# Patient Record
Sex: Male | Born: 1935 | Race: Black or African American | Hispanic: No | Marital: Married | Smoking: Former smoker
Health system: Southern US, Community
[De-identification: ages and names within clinical notes are randomized; demographics above are authoritative.]

## PROBLEM LIST (undated history)

## (undated) DIAGNOSIS — M549 Dorsalgia, unspecified: Secondary | ICD-10-CM

## (undated) DIAGNOSIS — N3010 Interstitial cystitis (chronic) without hematuria: Secondary | ICD-10-CM

## (undated) DIAGNOSIS — I5022 Chronic systolic (congestive) heart failure: Secondary | ICD-10-CM

## (undated) DIAGNOSIS — Z86006 Personal history of melanoma in-situ: Secondary | ICD-10-CM

## (undated) DIAGNOSIS — G8929 Other chronic pain: Secondary | ICD-10-CM

## (undated) DIAGNOSIS — Z789 Other specified health status: Secondary | ICD-10-CM

## (undated) DIAGNOSIS — Z950 Presence of cardiac pacemaker: Secondary | ICD-10-CM

## (undated) DIAGNOSIS — N529 Male erectile dysfunction, unspecified: Secondary | ICD-10-CM

## (undated) DIAGNOSIS — N400 Enlarged prostate without lower urinary tract symptoms: Secondary | ICD-10-CM

## (undated) DIAGNOSIS — I6529 Occlusion and stenosis of unspecified carotid artery: Secondary | ICD-10-CM

## (undated) DIAGNOSIS — M1990 Unspecified osteoarthritis, unspecified site: Secondary | ICD-10-CM

## (undated) DIAGNOSIS — K861 Other chronic pancreatitis: Secondary | ICD-10-CM

## (undated) DIAGNOSIS — F0280 Dementia in other diseases classified elsewhere without behavioral disturbance: Secondary | ICD-10-CM

## (undated) DIAGNOSIS — I442 Atrioventricular block, complete: Secondary | ICD-10-CM

## (undated) DIAGNOSIS — I340 Nonrheumatic mitral (valve) insufficiency: Secondary | ICD-10-CM

## (undated) DIAGNOSIS — H539 Unspecified visual disturbance: Secondary | ICD-10-CM

## (undated) DIAGNOSIS — R21 Rash and other nonspecific skin eruption: Secondary | ICD-10-CM

## (undated) DIAGNOSIS — N3941 Urge incontinence: Secondary | ICD-10-CM

## (undated) DIAGNOSIS — I4891 Unspecified atrial fibrillation: Secondary | ICD-10-CM

## (undated) DIAGNOSIS — I10 Essential (primary) hypertension: Secondary | ICD-10-CM

## (undated) DIAGNOSIS — I428 Other cardiomyopathies: Secondary | ICD-10-CM

## (undated) DIAGNOSIS — F0390 Unspecified dementia without behavioral disturbance: Secondary | ICD-10-CM

## (undated) DIAGNOSIS — H9190 Unspecified hearing loss, unspecified ear: Secondary | ICD-10-CM

## (undated) DIAGNOSIS — Z87898 Personal history of other specified conditions: Secondary | ICD-10-CM

## (undated) DIAGNOSIS — M940 Chondrocostal junction syndrome [Tietze]: Secondary | ICD-10-CM

## (undated) DIAGNOSIS — G309 Alzheimer's disease, unspecified: Secondary | ICD-10-CM

## (undated) DIAGNOSIS — C801 Malignant (primary) neoplasm, unspecified: Secondary | ICD-10-CM

## (undated) HISTORY — PX: INGUINAL HERNIA REPAIR: SUR1180

## (undated) HISTORY — DX: Unspecified visual disturbance: H53.9

## (undated) HISTORY — PX: CATARACT EXTRACTION, BILATERAL: SHX1313

## (undated) HISTORY — DX: Chronic systolic (congestive) heart failure: I50.22

## (undated) HISTORY — DX: Rash and other nonspecific skin eruption: R21

## (undated) HISTORY — DX: Dementia in other diseases classified elsewhere, unspecified severity, without behavioral disturbance, psychotic disturbance, mood disturbance, and anxiety: F02.80

## (undated) HISTORY — DX: Male erectile dysfunction, unspecified: N52.9

## (undated) HISTORY — PX: BACK SURGERY: SHX140

## (undated) HISTORY — DX: Chondrocostal junction syndrome (tietze): M94.0

## (undated) HISTORY — PX: PACEMAKER INSERTION: SHX728

## (undated) HISTORY — DX: Malignant (primary) neoplasm, unspecified: C80.1

## (undated) HISTORY — DX: Alzheimer's disease, unspecified: G30.9

## (undated) HISTORY — DX: Other chronic pancreatitis: K86.1

## (undated) HISTORY — PX: INTERSTIM IMPLANT PLACEMENT: SHX5130

---
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
## 1998-04-15 ENCOUNTER — Ambulatory Visit (HOSPITAL_COMMUNITY): Admission: RE | Admit: 1998-04-15 | Discharge: 1998-04-15 | Payer: Self-pay | Admitting: Neurological Surgery

## 1998-04-28 ENCOUNTER — Ambulatory Visit (HOSPITAL_COMMUNITY): Admission: RE | Admit: 1998-04-28 | Discharge: 1998-04-28 | Payer: Self-pay | Admitting: Neurological Surgery

## 1998-05-12 ENCOUNTER — Ambulatory Visit (HOSPITAL_COMMUNITY): Admission: RE | Admit: 1998-05-12 | Discharge: 1998-05-12 | Payer: Self-pay | Admitting: Neurological Surgery

## 1998-05-27 ENCOUNTER — Ambulatory Visit (HOSPITAL_COMMUNITY): Admission: RE | Admit: 1998-05-27 | Discharge: 1998-05-27 | Payer: Self-pay | Admitting: Neurological Surgery

## 1998-07-09 ENCOUNTER — Ambulatory Visit (HOSPITAL_COMMUNITY): Admission: RE | Admit: 1998-07-09 | Discharge: 1998-07-09 | Payer: Self-pay | Admitting: Neurological Surgery

## 1998-07-14 ENCOUNTER — Emergency Department (HOSPITAL_COMMUNITY): Admission: EM | Admit: 1998-07-14 | Discharge: 1998-07-14 | Payer: Self-pay | Admitting: Emergency Medicine

## 1999-01-05 ENCOUNTER — Ambulatory Visit (HOSPITAL_COMMUNITY): Admission: RE | Admit: 1999-01-05 | Discharge: 1999-01-05 | Payer: Self-pay | Admitting: Neurological Surgery

## 1999-01-05 ENCOUNTER — Encounter: Payer: Self-pay | Admitting: Neurological Surgery

## 1999-01-21 ENCOUNTER — Encounter: Payer: Self-pay | Admitting: Neurological Surgery

## 1999-01-21 ENCOUNTER — Ambulatory Visit (HOSPITAL_COMMUNITY): Admission: RE | Admit: 1999-01-21 | Discharge: 1999-01-21 | Payer: Self-pay | Admitting: Neurological Surgery

## 1999-04-21 ENCOUNTER — Encounter: Payer: Self-pay | Admitting: Neurological Surgery

## 1999-04-23 ENCOUNTER — Encounter: Payer: Self-pay | Admitting: Neurological Surgery

## 1999-04-23 ENCOUNTER — Inpatient Hospital Stay (HOSPITAL_COMMUNITY): Admission: RE | Admit: 1999-04-23 | Discharge: 1999-05-04 | Payer: Self-pay | Admitting: Neurological Surgery

## 1999-05-04 ENCOUNTER — Inpatient Hospital Stay (HOSPITAL_COMMUNITY)
Admission: RE | Admit: 1999-05-04 | Discharge: 1999-05-14 | Payer: Self-pay | Admitting: Physical Medicine and Rehabilitation

## 1999-05-18 ENCOUNTER — Encounter
Admission: RE | Admit: 1999-05-18 | Discharge: 1999-08-16 | Payer: Self-pay | Admitting: Physical Medicine and Rehabilitation

## 1999-09-11 ENCOUNTER — Ambulatory Visit (HOSPITAL_COMMUNITY): Admission: RE | Admit: 1999-09-11 | Discharge: 1999-09-11 | Payer: Self-pay | Admitting: Urology

## 2000-06-07 ENCOUNTER — Observation Stay (HOSPITAL_COMMUNITY): Admission: RE | Admit: 2000-06-07 | Discharge: 2000-06-07 | Payer: Self-pay | Admitting: Urology

## 2000-06-07 ENCOUNTER — Encounter: Payer: Self-pay | Admitting: Urology

## 2000-06-16 ENCOUNTER — Encounter: Payer: Self-pay | Admitting: Urology

## 2000-06-21 ENCOUNTER — Ambulatory Visit (HOSPITAL_COMMUNITY): Admission: RE | Admit: 2000-06-21 | Discharge: 2000-06-21 | Payer: Self-pay | Admitting: Urology

## 2000-06-21 ENCOUNTER — Encounter: Payer: Self-pay | Admitting: Urology

## 2000-10-04 HISTORY — PX: OTHER SURGICAL HISTORY: SHX169

## 2001-02-10 ENCOUNTER — Encounter (HOSPITAL_COMMUNITY): Admission: RE | Admit: 2001-02-10 | Discharge: 2001-03-12 | Payer: Self-pay | Admitting: Oncology

## 2001-02-10 ENCOUNTER — Encounter: Admission: RE | Admit: 2001-02-10 | Discharge: 2001-02-10 | Payer: Self-pay | Admitting: Oncology

## 2001-06-30 ENCOUNTER — Encounter (HOSPITAL_COMMUNITY): Admission: RE | Admit: 2001-06-30 | Discharge: 2001-07-30 | Payer: Self-pay | Admitting: Oncology

## 2001-06-30 ENCOUNTER — Encounter: Admission: RE | Admit: 2001-06-30 | Discharge: 2001-06-30 | Payer: Self-pay | Admitting: Oncology

## 2001-12-27 ENCOUNTER — Encounter: Admission: RE | Admit: 2001-12-27 | Discharge: 2001-12-27 | Payer: Self-pay | Admitting: Oncology

## 2002-05-22 ENCOUNTER — Ambulatory Visit (HOSPITAL_COMMUNITY): Admission: RE | Admit: 2002-05-22 | Discharge: 2002-05-22 | Payer: Self-pay | Admitting: Family Medicine

## 2002-05-22 ENCOUNTER — Encounter: Payer: Self-pay | Admitting: Family Medicine

## 2002-05-29 ENCOUNTER — Encounter: Admission: RE | Admit: 2002-05-29 | Discharge: 2002-05-29 | Payer: Self-pay | Admitting: Oncology

## 2002-05-29 ENCOUNTER — Encounter (HOSPITAL_COMMUNITY): Admission: RE | Admit: 2002-05-29 | Discharge: 2002-06-28 | Payer: Self-pay | Admitting: Oncology

## 2002-07-11 ENCOUNTER — Encounter (HOSPITAL_COMMUNITY): Admission: RE | Admit: 2002-07-11 | Discharge: 2002-08-10 | Payer: Self-pay | Admitting: Oncology

## 2002-07-11 ENCOUNTER — Encounter: Admission: RE | Admit: 2002-07-11 | Discharge: 2002-07-11 | Payer: Self-pay | Admitting: Oncology

## 2002-07-31 ENCOUNTER — Ambulatory Visit (HOSPITAL_COMMUNITY): Admission: RE | Admit: 2002-07-31 | Discharge: 2002-07-31 | Payer: Self-pay | Admitting: Cardiology

## 2003-01-14 ENCOUNTER — Encounter: Admission: RE | Admit: 2003-01-14 | Discharge: 2003-01-14 | Payer: Self-pay | Admitting: Oncology

## 2003-01-14 ENCOUNTER — Encounter (HOSPITAL_COMMUNITY): Admission: RE | Admit: 2003-01-14 | Discharge: 2003-02-13 | Payer: Self-pay | Admitting: Oncology

## 2003-02-18 ENCOUNTER — Emergency Department (HOSPITAL_COMMUNITY): Admission: EM | Admit: 2003-02-18 | Discharge: 2003-02-18 | Payer: Self-pay | Admitting: Emergency Medicine

## 2003-02-18 ENCOUNTER — Encounter: Payer: Self-pay | Admitting: Emergency Medicine

## 2003-02-19 ENCOUNTER — Inpatient Hospital Stay (HOSPITAL_COMMUNITY): Admission: AD | Admit: 2003-02-19 | Discharge: 2003-02-25 | Payer: Self-pay | Admitting: Internal Medicine

## 2003-02-20 ENCOUNTER — Encounter: Payer: Self-pay | Admitting: Pulmonary Disease

## 2003-05-13 ENCOUNTER — Encounter: Payer: Self-pay | Admitting: Urology

## 2003-05-14 ENCOUNTER — Ambulatory Visit (HOSPITAL_BASED_OUTPATIENT_CLINIC_OR_DEPARTMENT_OTHER): Admission: RE | Admit: 2003-05-14 | Discharge: 2003-05-14 | Payer: Self-pay | Admitting: Urology

## 2003-05-14 ENCOUNTER — Encounter: Payer: Self-pay | Admitting: Urology

## 2003-05-14 HISTORY — PX: OTHER SURGICAL HISTORY: SHX169

## 2003-07-17 ENCOUNTER — Encounter: Admission: RE | Admit: 2003-07-17 | Discharge: 2003-07-17 | Payer: Self-pay | Admitting: Oncology

## 2003-10-01 ENCOUNTER — Ambulatory Visit (HOSPITAL_COMMUNITY): Admission: RE | Admit: 2003-10-01 | Discharge: 2003-10-01 | Payer: Self-pay | Admitting: Family Medicine

## 2004-01-15 ENCOUNTER — Encounter (HOSPITAL_COMMUNITY): Admission: RE | Admit: 2004-01-15 | Discharge: 2004-02-14 | Payer: Self-pay | Admitting: Oncology

## 2004-01-15 ENCOUNTER — Encounter: Admission: RE | Admit: 2004-01-15 | Discharge: 2004-01-15 | Payer: Self-pay | Admitting: Oncology

## 2004-04-14 ENCOUNTER — Ambulatory Visit (HOSPITAL_COMMUNITY): Admission: RE | Admit: 2004-04-14 | Discharge: 2004-04-14 | Payer: Self-pay | Admitting: Urology

## 2004-04-14 ENCOUNTER — Ambulatory Visit (HOSPITAL_BASED_OUTPATIENT_CLINIC_OR_DEPARTMENT_OTHER): Admission: RE | Admit: 2004-04-14 | Discharge: 2004-04-14 | Payer: Self-pay | Admitting: Urology

## 2004-04-14 HISTORY — PX: OTHER SURGICAL HISTORY: SHX169

## 2004-07-14 ENCOUNTER — Ambulatory Visit (HOSPITAL_COMMUNITY): Admission: RE | Admit: 2004-07-14 | Discharge: 2004-07-14 | Payer: Self-pay | Admitting: Orthopaedic Surgery

## 2004-07-17 ENCOUNTER — Encounter: Admission: RE | Admit: 2004-07-17 | Discharge: 2004-07-17 | Payer: Self-pay | Admitting: Oncology

## 2004-07-17 ENCOUNTER — Encounter (HOSPITAL_COMMUNITY): Admission: RE | Admit: 2004-07-17 | Discharge: 2004-08-16 | Payer: Self-pay | Admitting: Oncology

## 2005-07-20 ENCOUNTER — Encounter (HOSPITAL_COMMUNITY): Admission: RE | Admit: 2005-07-20 | Discharge: 2005-08-19 | Payer: Self-pay | Admitting: Oncology

## 2005-07-20 ENCOUNTER — Encounter: Admission: RE | Admit: 2005-07-20 | Discharge: 2005-07-20 | Payer: Self-pay | Admitting: Oncology

## 2005-07-20 ENCOUNTER — Ambulatory Visit (HOSPITAL_COMMUNITY): Payer: Self-pay | Admitting: Oncology

## 2006-01-18 ENCOUNTER — Encounter (HOSPITAL_COMMUNITY): Admission: RE | Admit: 2006-01-18 | Discharge: 2006-02-17 | Payer: Self-pay | Admitting: Oncology

## 2006-01-18 ENCOUNTER — Ambulatory Visit (HOSPITAL_COMMUNITY): Payer: Self-pay | Admitting: Oncology

## 2006-01-18 ENCOUNTER — Encounter: Admission: RE | Admit: 2006-01-18 | Discharge: 2006-01-18 | Payer: Self-pay | Admitting: Oncology

## 2006-02-02 ENCOUNTER — Encounter: Payer: Self-pay | Admitting: Neurological Surgery

## 2006-05-27 ENCOUNTER — Ambulatory Visit (HOSPITAL_BASED_OUTPATIENT_CLINIC_OR_DEPARTMENT_OTHER): Admission: RE | Admit: 2006-05-27 | Discharge: 2006-05-27 | Payer: Self-pay | Admitting: Urology

## 2006-06-10 ENCOUNTER — Ambulatory Visit (HOSPITAL_BASED_OUTPATIENT_CLINIC_OR_DEPARTMENT_OTHER): Admission: RE | Admit: 2006-06-10 | Discharge: 2006-06-10 | Payer: Self-pay | Admitting: Urology

## 2006-07-05 ENCOUNTER — Ambulatory Visit: Payer: Self-pay | Admitting: Family Medicine

## 2006-07-07 ENCOUNTER — Ambulatory Visit: Payer: Self-pay | Admitting: Internal Medicine

## 2006-07-07 ENCOUNTER — Ambulatory Visit (HOSPITAL_COMMUNITY): Admission: RE | Admit: 2006-07-07 | Discharge: 2006-07-07 | Payer: Self-pay | Admitting: Family Medicine

## 2006-08-18 ENCOUNTER — Encounter (INDEPENDENT_AMBULATORY_CARE_PROVIDER_SITE_OTHER): Payer: Self-pay | Admitting: Specialist

## 2006-08-18 ENCOUNTER — Ambulatory Visit: Payer: Self-pay | Admitting: Internal Medicine

## 2006-08-18 ENCOUNTER — Ambulatory Visit (HOSPITAL_COMMUNITY): Admission: RE | Admit: 2006-08-18 | Discharge: 2006-08-18 | Payer: Self-pay | Admitting: Internal Medicine

## 2006-08-18 HISTORY — PX: COLONOSCOPY: SHX174

## 2006-08-18 LAB — HM COLONOSCOPY: HM Colonoscopy: NORMAL

## 2006-08-29 ENCOUNTER — Ambulatory Visit: Payer: Self-pay | Admitting: Family Medicine

## 2007-01-12 ENCOUNTER — Ambulatory Visit (HOSPITAL_COMMUNITY): Payer: Self-pay | Admitting: Oncology

## 2007-01-12 ENCOUNTER — Encounter (HOSPITAL_COMMUNITY): Admission: RE | Admit: 2007-01-12 | Discharge: 2007-02-11 | Payer: Self-pay | Admitting: Oncology

## 2007-04-06 ENCOUNTER — Ambulatory Visit: Payer: Self-pay | Admitting: Family Medicine

## 2007-04-19 ENCOUNTER — Ambulatory Visit (HOSPITAL_COMMUNITY): Admission: RE | Admit: 2007-04-19 | Discharge: 2007-04-19 | Payer: Self-pay | Admitting: Family Medicine

## 2007-04-21 ENCOUNTER — Encounter: Payer: Self-pay | Admitting: Family Medicine

## 2007-04-21 LAB — CONVERTED CEMR LAB: Creatinine, Ser: 1.4 mg/dL (ref 0.40–1.50)

## 2007-04-25 ENCOUNTER — Ambulatory Visit (HOSPITAL_COMMUNITY): Admission: RE | Admit: 2007-04-25 | Discharge: 2007-04-25 | Payer: Self-pay | Admitting: Family Medicine

## 2007-05-03 ENCOUNTER — Encounter: Admission: RE | Admit: 2007-05-03 | Discharge: 2007-05-03 | Payer: Self-pay | Admitting: Family Medicine

## 2007-05-18 ENCOUNTER — Encounter: Admission: RE | Admit: 2007-05-18 | Discharge: 2007-05-18 | Payer: Self-pay | Admitting: Family Medicine

## 2007-05-29 ENCOUNTER — Ambulatory Visit: Payer: Self-pay | Admitting: Family Medicine

## 2007-06-29 ENCOUNTER — Ambulatory Visit: Payer: Self-pay | Admitting: Family Medicine

## 2007-08-29 ENCOUNTER — Ambulatory Visit: Payer: Self-pay | Admitting: Family Medicine

## 2007-09-05 ENCOUNTER — Encounter: Payer: Self-pay | Admitting: Family Medicine

## 2007-09-05 LAB — CONVERTED CEMR LAB
Basophils Absolute: 0 10*3/uL (ref 0.0–0.1)
Basophils Relative: 1 % (ref 0–1)
CO2: 29 meq/L (ref 19–32)
Calcium: 9.6 mg/dL (ref 8.4–10.5)
Creatinine, Ser: 1.09 mg/dL (ref 0.40–1.50)
Eosinophils Relative: 3 % (ref 0–5)
HCT: 33.1 % — ABNORMAL LOW (ref 39.0–52.0)
HDL: 62 mg/dL (ref >39)
Hemoglobin: 11.4 g/dL — ABNORMAL LOW (ref 13.0–17.0)
Lymphocytes Relative: 27 % (ref 12–46)
MCHC: 34.4 g/dL (ref 30.0–36.0)
Monocytes Absolute: 0.2 10*3/uL (ref 0.1–1.0)
Neutro Abs: 1.2 10*3/uL — ABNORMAL LOW (ref 1.7–7.7)
Platelets: 137 10*3/uL — ABNORMAL LOW (ref 150–400)
RDW: 13.8 % (ref 11.5–15.5)
Sodium: 144 meq/L (ref 135–145)
Total CHOL/HDL Ratio: 3.2
Triglycerides: 43 mg/dL (ref <150)

## 2008-01-11 ENCOUNTER — Ambulatory Visit (HOSPITAL_COMMUNITY): Payer: Self-pay | Admitting: Oncology

## 2008-01-11 ENCOUNTER — Encounter (HOSPITAL_COMMUNITY): Admission: RE | Admit: 2008-01-11 | Discharge: 2008-02-10 | Payer: Self-pay | Admitting: Oncology

## 2008-01-23 ENCOUNTER — Ambulatory Visit: Payer: Self-pay | Admitting: Family Medicine

## 2008-01-31 DIAGNOSIS — F528 Other sexual dysfunction not due to a substance or known physiological condition: Secondary | ICD-10-CM

## 2008-01-31 DIAGNOSIS — I10 Essential (primary) hypertension: Secondary | ICD-10-CM | POA: Insufficient documentation

## 2008-01-31 DIAGNOSIS — H5310 Unspecified subjective visual disturbances: Secondary | ICD-10-CM | POA: Insufficient documentation

## 2008-01-31 DIAGNOSIS — H409 Unspecified glaucoma: Secondary | ICD-10-CM

## 2008-03-05 ENCOUNTER — Encounter: Payer: Self-pay | Admitting: Family Medicine

## 2008-03-05 ENCOUNTER — Ambulatory Visit: Payer: Self-pay | Admitting: Family Medicine

## 2008-03-08 ENCOUNTER — Ambulatory Visit (HOSPITAL_COMMUNITY): Admission: RE | Admit: 2008-03-08 | Discharge: 2008-03-08 | Payer: Self-pay | Admitting: Family Medicine

## 2008-03-16 ENCOUNTER — Emergency Department (HOSPITAL_COMMUNITY): Admission: EM | Admit: 2008-03-16 | Discharge: 2008-03-16 | Payer: Self-pay | Admitting: Emergency Medicine

## 2008-05-02 ENCOUNTER — Ambulatory Visit (HOSPITAL_COMMUNITY): Admission: RE | Admit: 2008-05-02 | Discharge: 2008-05-02 | Payer: Self-pay | Admitting: Family Medicine

## 2008-05-02 ENCOUNTER — Ambulatory Visit: Payer: Self-pay | Admitting: Family Medicine

## 2008-05-02 ENCOUNTER — Encounter: Payer: Self-pay | Admitting: Family Medicine

## 2008-05-03 ENCOUNTER — Ambulatory Visit (HOSPITAL_COMMUNITY): Admission: RE | Admit: 2008-05-03 | Discharge: 2008-05-03 | Payer: Self-pay | Admitting: Neurological Surgery

## 2008-05-03 DIAGNOSIS — M940 Chondrocostal junction syndrome [Tietze]: Secondary | ICD-10-CM

## 2008-05-03 DIAGNOSIS — R55 Syncope and collapse: Secondary | ICD-10-CM

## 2008-05-06 ENCOUNTER — Ambulatory Visit: Payer: Self-pay | Admitting: Family Medicine

## 2008-05-06 ENCOUNTER — Telehealth: Payer: Self-pay | Admitting: Family Medicine

## 2008-05-28 ENCOUNTER — Encounter: Payer: Self-pay | Admitting: Family Medicine

## 2008-05-28 ENCOUNTER — Ambulatory Visit: Payer: Self-pay | Admitting: Cardiology

## 2008-05-30 ENCOUNTER — Ambulatory Visit (HOSPITAL_COMMUNITY): Admission: RE | Admit: 2008-05-30 | Discharge: 2008-05-30 | Payer: Self-pay | Admitting: Cardiology

## 2008-05-31 ENCOUNTER — Telehealth: Payer: Self-pay | Admitting: Family Medicine

## 2008-06-18 ENCOUNTER — Ambulatory Visit: Payer: Self-pay | Admitting: Cardiology

## 2008-06-18 ENCOUNTER — Encounter: Payer: Self-pay | Admitting: Family Medicine

## 2008-06-19 ENCOUNTER — Ambulatory Visit (HOSPITAL_COMMUNITY): Payer: Self-pay | Admitting: Family Medicine

## 2008-06-19 ENCOUNTER — Encounter (HOSPITAL_COMMUNITY): Admission: RE | Admit: 2008-06-19 | Discharge: 2008-07-01 | Payer: Self-pay | Admitting: Oncology

## 2008-07-11 ENCOUNTER — Ambulatory Visit: Payer: Self-pay | Admitting: Family Medicine

## 2008-07-30 ENCOUNTER — Telehealth: Payer: Self-pay | Admitting: Family Medicine

## 2008-09-18 ENCOUNTER — Encounter (HOSPITAL_COMMUNITY): Admission: RE | Admit: 2008-09-18 | Discharge: 2008-10-01 | Payer: Self-pay | Admitting: Oncology

## 2008-09-18 ENCOUNTER — Ambulatory Visit (HOSPITAL_COMMUNITY): Payer: Self-pay | Admitting: Oncology

## 2008-10-15 ENCOUNTER — Ambulatory Visit: Payer: Self-pay | Admitting: Family Medicine

## 2008-10-15 DIAGNOSIS — R21 Rash and other nonspecific skin eruption: Secondary | ICD-10-CM

## 2008-12-05 ENCOUNTER — Inpatient Hospital Stay (HOSPITAL_COMMUNITY): Admission: EM | Admit: 2008-12-05 | Discharge: 2008-12-08 | Payer: Self-pay | Admitting: Internal Medicine

## 2008-12-05 ENCOUNTER — Ambulatory Visit: Payer: Self-pay | Admitting: Cardiology

## 2008-12-05 ENCOUNTER — Encounter: Payer: Self-pay | Admitting: Emergency Medicine

## 2008-12-05 ENCOUNTER — Ambulatory Visit: Payer: Self-pay | Admitting: Family Medicine

## 2008-12-05 DIAGNOSIS — R5381 Other malaise: Secondary | ICD-10-CM | POA: Insufficient documentation

## 2008-12-05 DIAGNOSIS — I498 Other specified cardiac arrhythmias: Secondary | ICD-10-CM

## 2008-12-05 DIAGNOSIS — R5383 Other fatigue: Secondary | ICD-10-CM

## 2008-12-06 ENCOUNTER — Encounter (INDEPENDENT_AMBULATORY_CARE_PROVIDER_SITE_OTHER): Payer: Self-pay | Admitting: Ophthalmology

## 2008-12-06 HISTORY — PX: TRANSTHORACIC ECHOCARDIOGRAM: SHX275

## 2008-12-25 ENCOUNTER — Ambulatory Visit: Payer: Self-pay

## 2008-12-25 ENCOUNTER — Encounter: Payer: Self-pay | Admitting: Internal Medicine

## 2008-12-31 DIAGNOSIS — D61818 Other pancytopenia: Secondary | ICD-10-CM

## 2008-12-31 DIAGNOSIS — I5022 Chronic systolic (congestive) heart failure: Secondary | ICD-10-CM

## 2008-12-31 DIAGNOSIS — I080 Rheumatic disorders of both mitral and aortic valves: Secondary | ICD-10-CM

## 2009-01-09 ENCOUNTER — Ambulatory Visit (HOSPITAL_COMMUNITY): Payer: Self-pay | Admitting: Oncology

## 2009-01-09 ENCOUNTER — Encounter (HOSPITAL_COMMUNITY): Admission: RE | Admit: 2009-01-09 | Discharge: 2009-02-09 | Payer: Self-pay | Admitting: Oncology

## 2009-01-15 ENCOUNTER — Encounter: Payer: Self-pay | Admitting: Family Medicine

## 2009-01-17 ENCOUNTER — Encounter: Payer: Self-pay | Admitting: Internal Medicine

## 2009-01-17 ENCOUNTER — Ambulatory Visit: Payer: Self-pay | Admitting: Internal Medicine

## 2009-01-21 ENCOUNTER — Ambulatory Visit: Payer: Self-pay | Admitting: Family Medicine

## 2009-01-22 ENCOUNTER — Encounter: Payer: Self-pay | Admitting: Family Medicine

## 2009-01-22 LAB — CONVERTED CEMR LAB
Calcium: 9.2 mg/dL (ref 8.4–10.5)
Cholesterol: 168 mg/dL (ref 0–200)
Creatinine, Ser: 1.21 mg/dL (ref 0.40–1.50)
HCT: 33.3 % — ABNORMAL LOW (ref 39.0–52.0)
HDL: 63 mg/dL (ref >39)
MCHC: 33.3 g/dL (ref 30.0–36.0)
MCV: 89.8 fL (ref 78.0–100.0)
Platelets: 118 10*3/uL — ABNORMAL LOW (ref 150–400)
RDW: 15.2 % (ref 11.5–15.5)
Sodium: 141 meq/L (ref 135–145)
TSH: 1.683 microintl units/mL (ref 0.350–4.500)
Total CHOL/HDL Ratio: 2.7
Triglycerides: 43 mg/dL (ref <150)

## 2009-04-22 ENCOUNTER — Ambulatory Visit: Payer: Self-pay | Admitting: Family Medicine

## 2009-05-15 ENCOUNTER — Ambulatory Visit (HOSPITAL_BASED_OUTPATIENT_CLINIC_OR_DEPARTMENT_OTHER): Admission: RE | Admit: 2009-05-15 | Discharge: 2009-05-15 | Payer: Self-pay | Admitting: Urology

## 2009-05-15 HISTORY — PX: OTHER SURGICAL HISTORY: SHX169

## 2009-05-28 ENCOUNTER — Encounter: Payer: Self-pay | Admitting: Family Medicine

## 2009-06-03 ENCOUNTER — Encounter: Payer: Self-pay | Admitting: Family Medicine

## 2009-06-18 ENCOUNTER — Encounter: Payer: Self-pay | Admitting: Family Medicine

## 2009-07-08 ENCOUNTER — Ambulatory Visit: Payer: Self-pay | Admitting: Family Medicine

## 2009-07-10 LAB — CONVERTED CEMR LAB
BUN: 21 mg/dL (ref 6–23)
CO2: 25 meq/L (ref 19–32)
Chloride: 106 meq/L (ref 96–112)
Potassium: 4.4 meq/L (ref 3.5–5.3)

## 2009-07-11 ENCOUNTER — Ambulatory Visit (HOSPITAL_COMMUNITY): Payer: Self-pay | Admitting: Oncology

## 2009-07-14 ENCOUNTER — Encounter (HOSPITAL_COMMUNITY): Admission: RE | Admit: 2009-07-14 | Discharge: 2009-08-13 | Payer: Self-pay | Admitting: Oncology

## 2009-07-14 DIAGNOSIS — N3010 Interstitial cystitis (chronic) without hematuria: Secondary | ICD-10-CM

## 2009-07-22 ENCOUNTER — Ambulatory Visit: Payer: Self-pay | Admitting: Internal Medicine

## 2009-07-22 DIAGNOSIS — Z950 Presence of cardiac pacemaker: Secondary | ICD-10-CM | POA: Insufficient documentation

## 2009-07-25 ENCOUNTER — Emergency Department (HOSPITAL_COMMUNITY): Admission: EM | Admit: 2009-07-25 | Discharge: 2009-07-25 | Payer: Self-pay | Admitting: Emergency Medicine

## 2009-08-20 ENCOUNTER — Encounter: Payer: Self-pay | Admitting: Family Medicine

## 2009-08-26 ENCOUNTER — Telehealth: Payer: Self-pay | Admitting: Family Medicine

## 2009-09-16 ENCOUNTER — Telehealth: Payer: Self-pay | Admitting: Family Medicine

## 2009-10-15 ENCOUNTER — Telehealth: Payer: Self-pay | Admitting: Family Medicine

## 2009-11-10 ENCOUNTER — Ambulatory Visit: Payer: Self-pay | Admitting: Family Medicine

## 2009-11-10 DIAGNOSIS — H905 Unspecified sensorineural hearing loss: Secondary | ICD-10-CM

## 2009-11-17 ENCOUNTER — Encounter: Payer: Self-pay | Admitting: Family Medicine

## 2009-11-18 ENCOUNTER — Telehealth: Payer: Self-pay | Admitting: Family Medicine

## 2009-11-25 DIAGNOSIS — R413 Other amnesia: Secondary | ICD-10-CM | POA: Insufficient documentation

## 2009-11-25 DIAGNOSIS — M159 Polyosteoarthritis, unspecified: Secondary | ICD-10-CM | POA: Insufficient documentation

## 2010-01-06 ENCOUNTER — Telehealth: Payer: Self-pay | Admitting: Family Medicine

## 2010-01-12 ENCOUNTER — Ambulatory Visit (HOSPITAL_COMMUNITY): Payer: Self-pay | Admitting: Oncology

## 2010-01-12 ENCOUNTER — Encounter (HOSPITAL_COMMUNITY): Admission: RE | Admit: 2010-01-12 | Discharge: 2010-02-11 | Payer: Self-pay | Admitting: Oncology

## 2010-01-14 ENCOUNTER — Encounter: Payer: Self-pay | Admitting: Family Medicine

## 2010-01-29 ENCOUNTER — Encounter: Payer: Self-pay | Admitting: Family Medicine

## 2010-02-09 ENCOUNTER — Ambulatory Visit: Payer: Self-pay | Admitting: Cardiology

## 2010-02-09 ENCOUNTER — Encounter: Payer: Self-pay | Admitting: Internal Medicine

## 2010-02-10 ENCOUNTER — Encounter: Payer: Self-pay | Admitting: Cardiology

## 2010-02-18 ENCOUNTER — Ambulatory Visit: Payer: Self-pay | Admitting: Family Medicine

## 2010-03-24 ENCOUNTER — Ambulatory Visit: Payer: Self-pay | Admitting: Family Medicine

## 2010-04-07 ENCOUNTER — Ambulatory Visit: Payer: Self-pay | Admitting: Family Medicine

## 2010-04-07 ENCOUNTER — Ambulatory Visit (HOSPITAL_COMMUNITY): Admission: RE | Admit: 2010-04-07 | Discharge: 2010-04-07 | Payer: Self-pay | Admitting: Family Medicine

## 2010-04-07 DIAGNOSIS — M542 Cervicalgia: Secondary | ICD-10-CM

## 2010-04-21 ENCOUNTER — Ambulatory Visit: Payer: Self-pay | Admitting: Family Medicine

## 2010-06-02 ENCOUNTER — Ambulatory Visit: Payer: Self-pay | Admitting: Family Medicine

## 2010-06-05 LAB — CONVERTED CEMR LAB
BUN: 21 mg/dL (ref 6–23)
Cholesterol: 191 mg/dL (ref 0–200)
Creatinine, Ser: 1.34 mg/dL (ref 0.40–1.50)
Glucose, Bld: 99 mg/dL (ref 70–99)
LDL Cholesterol: 126 mg/dL — ABNORMAL HIGH (ref 0–99)
Potassium: 3.9 meq/L (ref 3.5–5.3)
VLDL: 10 mg/dL (ref 0–40)

## 2010-06-25 ENCOUNTER — Telehealth: Payer: Self-pay | Admitting: Family Medicine

## 2010-07-03 ENCOUNTER — Telehealth: Payer: Self-pay | Admitting: Family Medicine

## 2010-07-08 ENCOUNTER — Ambulatory Visit: Payer: Self-pay | Admitting: Family Medicine

## 2010-07-24 ENCOUNTER — Telehealth: Payer: Self-pay | Admitting: Family Medicine

## 2010-07-31 ENCOUNTER — Ambulatory Visit: Payer: Self-pay | Admitting: Cardiology

## 2010-07-31 ENCOUNTER — Encounter: Payer: Self-pay | Admitting: Internal Medicine

## 2010-08-05 ENCOUNTER — Encounter: Payer: Self-pay | Admitting: Family Medicine

## 2010-08-05 ENCOUNTER — Telehealth: Payer: Self-pay | Admitting: Family Medicine

## 2010-08-17 ENCOUNTER — Encounter: Admission: RE | Admit: 2010-08-17 | Discharge: 2010-08-17 | Payer: Self-pay | Admitting: Orthopaedic Surgery

## 2010-09-07 ENCOUNTER — Encounter: Payer: Self-pay | Admitting: Family Medicine

## 2010-09-11 ENCOUNTER — Encounter (INDEPENDENT_AMBULATORY_CARE_PROVIDER_SITE_OTHER): Payer: Self-pay | Admitting: *Deleted

## 2010-09-14 ENCOUNTER — Ambulatory Visit: Payer: Self-pay | Admitting: Family Medicine

## 2010-09-14 DIAGNOSIS — D239 Other benign neoplasm of skin, unspecified: Secondary | ICD-10-CM | POA: Insufficient documentation

## 2010-09-22 ENCOUNTER — Encounter: Payer: Self-pay | Admitting: Family Medicine

## 2010-09-22 ENCOUNTER — Emergency Department (HOSPITAL_COMMUNITY)
Admission: EM | Admit: 2010-09-22 | Discharge: 2010-09-23 | Payer: Self-pay | Source: Home / Self Care | Admitting: Emergency Medicine

## 2010-09-25 ENCOUNTER — Encounter: Payer: Self-pay | Admitting: Family Medicine

## 2010-10-08 ENCOUNTER — Telehealth (INDEPENDENT_AMBULATORY_CARE_PROVIDER_SITE_OTHER): Payer: Self-pay | Admitting: *Deleted

## 2010-10-15 ENCOUNTER — Encounter: Payer: Self-pay | Admitting: Family Medicine

## 2010-10-25 ENCOUNTER — Encounter: Payer: Self-pay | Admitting: Family Medicine

## 2010-10-29 ENCOUNTER — Encounter: Payer: Self-pay | Admitting: Family Medicine

## 2010-11-05 NOTE — Letter (Signed)
Summary: Appointment - Reminder 2  [facility] at [facility]. [address]   [place], [place] [address]   Phone: [phone]  Fax: [phone]     September 11, 2010 MRN: [id number]   [name] [address] [place], [place]  [address]   Dear [name],  Our records indicate that it is time to schedule a follow-up appointment.  Dr.  Ladona Ridgel        recommended that you follow up with [place] in     11.2011 PAST DUE       . It is very important that we reach you to schedule this appointment. We look forward to participating in your health care needs. Please contact us at the number listed above at your earliest convenience to schedule your appointment.  If you are unable to make an appointment at this time, give [place] a call so we can update our records.     Sincerely,   Glass blower/designer

## 2010-11-05 NOTE — Procedures (Signed)
Summary: pc2 per reminder letter/sn   Current Medications (verified): 1)  Maxzide-25 37.5-25 Mg  Tabs (Triamterene-Hctz) .... Take 1 Tablet By Mouth Once A Day 2)  Uretron D/s  Tabs (Meth-Hyo-M Bl-Na Phos-Ph Sal) .... One Tab Three Times A Day 3)  Amitriptyline Hcl 25 Mg  Tabs (Amitriptyline Hcl) .... Take 1 Tab By Mouth At Bedtime 4)  Allopurinol 300 Mg Tabs (Allopurinol) .... One Tab By Mouth Qd 5)  Nabumetone 500 Mg Tabs (Nabumetone) .... 2 Tabs Daily 6)  Duragesic-50 50 Mcg/hr Pt72 (Fentanyl) .... Applyione Patch Every 3 Days 7)  Omeprazole 20 Mg Cpdr (Omeprazole) .... Take 1 Capsule By Mouth Once A Day 8)  Exelon 9.5 Mg/24hr Pt24 (Rivastigmine) .... Apply One Patch Daily, Then Remove 9)  Flomax 0.4 Mg Caps (Tamsulosin Hcl) .... One By Mouth Daily  Allergies (verified): 1)  ! Codeine   PPM Specifications Following MD:  Lewayne Bunting, MD     PPM Vendor:  Med[employer]onic     PPM Model Number:  [id number]     PPM Serial Number:  [id number] PPM DOI:  12/06/2008      Lead 1    Location: RA     DOI: 12/06/2008     Model #: [id number]     Serial #: [id number]     Status: active Lead 2    Location: RV     DOI: 12/06/2008     Model #: 6213     Serial #: [id number]     Status: active Lead 3    Location: LV     DOI: 12/06/2008     Model #: 2952     Serial #: [id number]     Status: active  Magnet Response Rate:  BOL 85 ERI 65  Indications:  CHF; 2:1 HB   PPM Follow Up Remote Check?  No Battery Voltage:  2.999 V     Battery Est. Longevity:  5.5 years     Pacer Dependent:  Yes       PPM Device Measurements Atrium  Amplitude: 2.8 mV, Impedance: 349 ohms, Threshold: 0.5 V at 0.4 msec Right Ventricle  Impedance: 415 ohms, Threshold: 1.0 V at 0.4 msec Left Ventricle  Impedance: 783 ohms, Threshold: 1.0 V at 0.4 msec  Episodes MS Episodes:  23     Percent Mode Switch:  0%     Coumadin:  No Ventricular High Rate:  0     Atrial Pacing:  39.5%     Ventricular Pacing:  100%  Parameters Mode:  DDD     Lower  Rate Limit:  60     Upper Rate Limit:  130 Paced AV Delay:  140     Sensed AV Delay:  120 Next Cardiology Appt Due:  01/03/2011 Tech Comments:  No parameter changes.  Device function noramal.  ROV 6 months with Dr. Ladona Ridgel in [place]. Altha Harm, LPN  July 31, 2010 3:28 PM

## 2010-11-05 NOTE — Progress Notes (Signed)
[facility]  [facility]   Imported By: Lind Guest 01/29/2010 09:59:26  _____________________________________________________________________  External Attachment:    Type:   Image     Comment:   External Document

## 2010-11-05 NOTE — Progress Notes (Signed)
Summary: medication to [facility]  Phone Note Call from Patient   Summary of Call: patient would like to use [facility] mail order for his duragesic 50 patch, he states they are cheaper their then at the pharmacy.  the number you can call for [facility] is [phone].  He only has 4 more patches, and he would like this called in for 3 months supply.  pramadol hcl tablet, he also needs those called in at a 3 month supply. Initial call taken by: Curtis Sites,  August 05, 2010 9:57 AM  Follow-up for Phone Call        patient states he spoke with pharmacy and they told him he could get 90 day supply of duragesic patches, wants you to write this, there was a script in narc drawer but i threw it away, it was outdated. Follow-up by: Adella Hare LPN,  August 05, 2010 12:08 PM  Additional Follow-up for Phone Call Additional follow up Details #1::        ca;l [facility] pls andverify this i am unaware of this policy, let me know if it can be done i will do it, let pt know you are checking on this pls Additional Follow-up by: Syliva Overman MD,  August 05, 2010 12:10 PM    Additional Follow-up for Phone Call Additional follow up Details #2::    per [facility] pharmacist, you can write for 90 days of duragesic patch,  Follow-up by: Adella Hare LPN,  August 12, 2010 4:42 PM  Additional Follow-up for Phone Call Additional follow up Details #3:: Details for Additional Follow-up Action Taken: script written for 90 day supply , may need to be maile by thept, not sure, pls find out from [facility], and let pt know, pls enter in emr also Additional Follow-up by: Syliva Overman MD,  August 21, 2010 8:12 AM  called patient, no answer lefJaime Boothe LPN  August 31, 2010 2:46 PM patient wife aware script is available for pickup Adella Hare LPN  September 04, 2010 3:30 PM

## 2010-11-05 NOTE — Assessment & Plan Note (Signed)
Summary: office visit   Vital Signs:  Patient profile:   77 year old male Height:      68 inches Weight:      177.25 pounds BMI:     27.05 O2 Sat:      99 % Pulse rate:   80 / minute Pulse rhythm:   regular Resp:     16 per minute BP sitting:   104 / 80  (left arm) Cuff size:   large  Vitals Entered By: Everitt Amber LPN (Feb 18, 2010 10:46 AM)  Nutrition Counseling: Patient's BMI is greater than 25 and therefore counseled on weight management options. CC: Still having back pain really bad,    Primary Care Provider:  Dr. Lodema [facility]  CC:  Still having back pain really bad and .  History of Present Illness: Reports  that he has not been doing well.His problem is severe and uncontrolled back pain, epidural injections have not afforded the relief that he had hoped for inrecent times. pain is advwersely affecting all aspectsof his life. Denies recent fever or chills. Denies sinus pressure, nasal congestion , ear pain or sore throat. Denies chest congestion, or cough productive of sputum. Denies chest pain, palpitations, PND, orthopnea or leg swelling. Denies abdominal pain, nausea, vomitting, diarrhea or constipation. Denies change in bowel movements or bloody stool. Denies dysuria , frequency, incontinence or hesitancy.  Denies headaches, vertigo, seizures. Denies depression, anxiety or insomnia. Denies  rash, lesions, or itch.     Current Medications (verified): 1)  Maxzide-25 37.5-25 Mg  Tabs (Triamterene-Hctz) .... Take 1 Tablet By Mouth Once A Day 2)  Uretron D/s  Tabs (Meth-Hyo-M Bl-Na Phos-Ph Sal) .... One Tab Three Times A Day 3)  Cialis 10 Mg  Tabs (Tadalafil) .... Take 1 Tablet By Mouth Once A Day As Needed 4)  Flomax 0.4 Mg  Cp24 (Tamsulosin Hcl) .... Take 1 Tablet By Mouth Once A Day 5)  Amitriptyline Hcl 25 Mg  Tabs (Amitriptyline Hcl) .... Take 1 Tab By Mouth At Bedtime 6)  Flexeril 10 Mg  Tabs (Cyclobenzaprine Hcl) .... Take 1 Tab By Mouth At  Bedtime 7)  Tramadol Hcl 50 Mg  Tabs (Tramadol Hcl) .... Take 1 Tablet By Mouth Three Times A Day 8)  Elmiron 100 Mg Caps (Pentosan Polysulfate Sodium) .... Take 2 Caps By Mouth Two Times A Day 9)  Allopurinol 300 Mg Tabs (Allopurinol) .... One Tab By Mouth Qd 10)  Celebrex 200 Mg Caps (Celecoxib) .... One Caop By Mouth Qd 11)  Exelon 9.5 Mg/24hr Pt24 (Rivastigmine) .... Apply One Patch Daily To Upper Chest and Upper Arms Start in March 2011 12)  Nabumetone 500 Mg Tabs (Nabumetone) .... 2 Tabs Daily  Allergies (verified): 1)  ! Codeine  Review of Systems      See HPI General:  Complains of fatigue, sleep disorder, and weakness; denies chills and fever; uncontrolled pain. Eyes:  Denies discharge and red eye. CV:  recently had pacemaker check which was god. MS:  Complains of low back pain and mid back pain; recently had an epidural; injecrtion which had no reliefg, back pian is uncontrolled, feels like crying, wakes up in the night in p[ain. Endo:  Denies cold intolerance, excessive hunger, excessive thirst, excessive urination, heat intolerance, polyuria, and weight change. Heme:  Denies abnormal bruising and bleeding. Allergy:  Denies hives or rash and itching eyes.  Physical Exam  General:  alert and well-nourished.  well-hydrated.  pt in pain HEENT: No  facial asymmetry,  EOMI, No sinus tenderness, TM's Clear, oropharynx  pink and moist.   Chest: Clear to auscultation bilaterally.  CVS: S1, S2, systolic murmurs, No S3.   Abd: Soft, Nontender.  MS: decreased  ROM spine, hips, shoulders and knees.  Ext: No edema.   CNS: CN 2-12 intact, power tone and sensation normal throughout.   Skin: Intact, no visible lesions or rashes.  Psych: Good eye contact, normal affect.  Memory intact, t anxious and  depressed appearing.    Impression & Recommendations:  Problem # 1:  BACK PAIN (ICD-724.5) Assessment Deteriorated  His updated medication list for this problem includes:    Flexeril  10 Mg Tabs (Cyclobenzaprine hcl) .Marland Kitchen... Take 1 tab by mouth at bedtime    Tramadol Hcl 50 Mg Tabs (Tramadol hcl) .Marland Kitchen... Take 1 tablet by mouth three times a day    Celebrex 200 Mg Caps (Celecoxib) ..... One caop by mouth qd    Nabumetone 500 Mg Tabs (Nabumetone) .Marland Kitchen... 2 tabs daily    Duragesic-25 25 Mcg/hr Pt72 (Fentanyl) .Marland Kitchen... Change every 72 hrs  Problem # 2:  GEN OSTEOARTHROSIS INVOLVING MULTIPLE SITES (ICD-715.09) Assessment: Deteriorated  His updated medication list for this problem includes:    Tramadol Hcl 50 Mg Tabs (Tramadol hcl) .Marland Kitchen... Take 1 tablet by mouth three times a day    Celebrex 200 Mg Caps (Celecoxib) ..... One caop by mouth qd    Nabumetone 500 Mg Tabs (Nabumetone) .Marland Kitchen... 2 tabs daily    Duragesic-25 25 Mcg/hr Pt72 (Fentanyl) .Marland Kitchen... Change every 72 hrs  Problem # 3:  INTERSTITIAL CYSTITIS (ICD-595.1) Assessment: Unchanged  Problem # 4:  HYPERTENSION (ICD-401.9) Assessment: Unchanged  His updated medication list for this problem includes:    Maxzide-25 37.5-25 Mg Tabs (Triamterene-hctz) .Marland Kitchen... Take 1 tablet by mouth once a day  BP today: 104/80 Prior BP: 120/70 (11/10/2009)  Labs Reviewed: K+: 4.4 (07/08/2009) Creat: : 1.18 (07/08/2009)   Chol: 168 (01/22/2009)   HDL: 63 (01/22/2009)   LDL: 96 (01/22/2009)   TG: 43 (01/22/2009)  Problem # 5:  PANCYTOPENIA (ICD-284.1) Assessment: Unchanged followed annually by hematology  Complete Medication List: 1)  Maxzide-25 37.5-25 Mg Tabs (Triamterene-hctz) .... Take 1 tablet by mouth once a day 2)  Uretron D/s Tabs (Meth-hyo-m bl-na phos-ph sal) .... One tab three times a day 3)  Cialis 10 Mg Tabs (Tadalafil) .... Take 1 tablet by mouth once a day as needed 4)  Flomax 0.4 Mg Cp24 (Tamsulosin hcl) .... Take 1 tablet by mouth once a day 5)  Amitriptyline Hcl 25 Mg Tabs (Amitriptyline hcl) .... Take 1 tab by mouth at bedtime 6)  Flexeril 10 Mg Tabs (Cyclobenzaprine hcl) .... Take 1 tab by mouth at bedtime 7)  Tramadol Hcl 50  Mg Tabs (Tramadol hcl) .... Take 1 tablet by mouth three times a day 8)  Elmiron 100 Mg Caps (Pentosan polysulfate sodium) .... Take 2 caps by mouth two times a day 9)  Allopurinol 300 Mg Tabs (Allopurinol) .... One tab by mouth qd 10)  Celebrex 200 Mg Caps (Celecoxib) .... One caop by mouth qd 11)  Exelon 9.5 Mg/24hr Pt24 (Rivastigmine) .... Apply one patch daily to upper chest and upper arms start in march 2011 12)  Nabumetone 500 Mg Tabs (Nabumetone) .... 2 tabs daily 13)  Duragesic-25 25 Mcg/hr Pt72 (Fentanyl) .... Change every 72 hrs  Patient Instructions: 1)  Please schedule a follow-up appointment in 1 month. 2)  I will notify Dr Danielle Dess of your  uncontrolled back pain and the plan to start duragesic patch. 3)  Pls only take nabematone as prescrinbed, and the patch for the back. Stop and the rub. 4)  New med is fentanyl apply one patch every 3 days, if no relief pls call and let me know Prescriptions: DURAGESIC-25 25 MCG/HR PT72 (FENTANYL) change every 72 hrs  #10 x 0   Entered and Authorized by:   Syliva Overman MD   Signed by:   Syliva Overman MD on 03/02/2010   Method used:   Handwritten   RxID:   [id number]

## 2010-11-05 NOTE — Letter (Signed)
Summary: handicap card  handicap card   Imported By: Lind Guest 09/25/2010 09:00:56  _____________________________________________________________________  External Attachment:    Type:   Image     Comment:   External Document

## 2010-11-05 NOTE — Letter (Signed)
Summary: cancer center progress note  cancer center progress note   Imported By: Faythe Ghee 02/10/2010 09:29:41  _____________________________________________________________________  External Attachment:    Type:   Image     Comment:   External Document

## 2010-11-05 NOTE — Letter (Signed)
Summary: hearing clinic  hearing clinic   Imported By: Lind Guest 11/18/2009 10:00:09  _____________________________________________________________________  External Attachment:    Type:   Image     Comment:   External Document

## 2010-11-05 NOTE — Miscellaneous (Signed)
Summary: neuro appt  appt with dr elsner Aug 10 at 12:00 called patient, left message  Appended Document: neuro appt patient aware

## 2010-11-05 NOTE — Assessment & Plan Note (Signed)
Summary: office visit   Vital Signs:  Patient profile:   77 year old male Height:      68 inches Weight:      173.75 pounds BMI:     26.51 O2 Sat:      94 % Pulse rate:   66 / minute Pulse rhythm:   regular Resp:     16 per minute BP sitting:   130 / 82  (left arm) Cuff size:   regular  Vitals Entered By: Everitt Amber LPN (April 21, 2010 10:18 AM)  Nutrition Counseling: Patient's BMI is greater than 25 and therefore counseled on weight management options. CC: Follow up visit, neck and shoulder hurting some   Primary Care Provider:  Dr. Lodema [facility]  CC:  Follow up visit and neck and shoulder hurting some.  History of Present Illness: p-t reportrs a 2 week h/o uncontrolledneckl pain radiating to his right shoulder and upper extremity.He has had no direct neck trauma.He states that his back pain is adequately controlled on the duragesicpatch, and his wife deniesany increased disorintationn or sleepiness, though shje does state that his mental steis deteriorating and she knows that shewill eventually need help with him. She states that even with her memory loss shefeels as though they would both benefit from enrollment in the cARE program.  Current Medications (verified): 1)  Maxzide-25 37.5-25 Mg  Tabs (Triamterene-Hctz) .... Take 1 Tablet By Mouth Once A Day 2)  Uretron D/s  Tabs (Meth-Hyo-M Bl-Na Phos-Ph Sal) .... One Tab Three Times A Day 3)  Amitriptyline Hcl 25 Mg  Tabs (Amitriptyline Hcl) .... Take 1 Tab By Mouth At Bedtime 4)  Allopurinol 300 Mg Tabs (Allopurinol) .... One Tab By Mouth Qd 5)  Nabumetone 500 Mg Tabs (Nabumetone) .... 2 Tabs Daily 6)  Duragesic-50 50 Mcg/hr Pt72 (Fentanyl) .... Applyione Patch Every 3 Days 7)  Methocarbamol 500 Mg Tabs (Methocarbamol) .... Take 2 Every 6 Hrs As Needed For Muscle Spasm in Neck  Allergies (verified): 1)  ! Codeine  Review of Systems      See HPI General:  Complains of fatigue and sleep disorder. Eyes:  Complains  of vision loss-both eyes. ENT:  Complains of decreased hearing. CV:  Denies chest pain or discomfort, palpitations, and swelling of feet. Resp:  Denies cough and sputum productive. GI:  Denies constipation, diarrhea, nausea, and vomiting. GU:  Complains of urinary frequency. MS:  Complains of low back pain, mid back pain, and stiffness; reports slight improveemnt in neck pain since last visit, howeveer states this past friday he went to a chiropracter who applied alternating warm and cold compresses with relief. Neuro:  Complains of memory loss. Psych:  Denies anxiety and depression. Endo:  Denies excessive thirst and excessive urination. Heme:  Denies abnormal bruising and bleeding. Allergy:  Denies hives or rash and itching eyes.  Physical Exam  General:  alert, well-developed, well-nourished, well-hydrated, appropriate dress, cooperative to examination, and good hygiene.   HEENT: No facial asymmetry,  EOMI, No sinus tenderness, TM's Clear, oropharynx  pink and moist. Hearing loss  Chest: Clear to auscultation bilaterally.  CVS: S1, S2, positive  murmur, No S3.   Abd: Soft, Nontender.  ZO:[id number]  ROM spine,adequate in hips, shoulders and knees.  Ext: No edema.   CNS: CN 2-12 intact, power tone and sensation normal throughout.   Skin: Intact, no visible lesions or rashes.  Psych: Good eye contact, normal affect.  Memory loss, not anxious or depressed appearing.  Impression & Recommendations:  Problem # 1:  NECK PAIN, ACUTE (ICD-723.1) Assessment Improved  His updated medication list for this problem includes:    Nabumetone 500 Mg Tabs (Nabumetone) .Marland Kitchen... 2 tabs daily    Duragesic-50 50 Mcg/hr Pt72 (Fentanyl) .Marland Kitchen... Applyione patch every 3 days    Methocarbamol 500 Mg Tabs (Methocarbamol) .Marland Kitchen... Take 2 every 6 hrs as needed for muscle spasm in neck  Orders: Depo- Medrol 80mg  (J1040) Ketorolac-Toradol 15mg  (B1478) Admin of Therapeutic Inj  intramuscular or subcutaneous  (29562) Neurosurgeon Referral (Neurosurgeon)  Problem # 2:  BACK PAIN (ICD-724.5) Assessment: Improved  His updated medication list for this problem includes:    Nabumetone 500 Mg Tabs (Nabumetone) .Marland Kitchen... 2 tabs daily    Duragesic-50 50 Mcg/hr Pt72 (Fentanyl) .Marland Kitchen... Applyione patch every 3 days    Methocarbamol 500 Mg Tabs (Methocarbamol) .Marland Kitchen... Take 2 every 6 hrs as needed for muscle spasm in neck  Problem # 3:  MEMORY LOSS (ICD-780.93) Assessment: Unchanged will refer to CARE, per  wife  Problem # 4:  HYPERTENSION (ICD-401.9) Assessment: Unchanged  His updated medication list for this problem includes:    Maxzide-25 37.5-25 Mg Tabs (Triamterene-hctz) .Marland Kitchen... Take 1 tablet by mouth once a day  BP today: 130/82 Prior BP: 118/60 (04/07/2010)  Labs Reviewed: K+: 4.4 (07/08/2009) Creat: : 1.18 (07/08/2009)   Chol: 168 (01/22/2009)   HDL: 63 (01/22/2009)   LDL: 96 (01/22/2009)   TG: 43 (01/22/2009)  Complete Medication List: 1)  Maxzide-25 37.5-25 Mg Tabs (Triamterene-hctz) .... Take 1 tablet by mouth once a day 2)  Uretron D/s Tabs (Meth-hyo-m bl-na phos-ph sal) .... One tab three times a day 3)  Amitriptyline Hcl 25 Mg Tabs (Amitriptyline hcl) .... Take 1 tab by mouth at bedtime 4)  Allopurinol 300 Mg Tabs (Allopurinol) .... One tab by mouth qd 5)  Nabumetone 500 Mg Tabs (Nabumetone) .... 2 tabs daily 6)  Duragesic-50 50 Mcg/hr Pt72 (Fentanyl) .... Applyione patch every 3 days 7)  Methocarbamol 500 Mg Tabs (Methocarbamol) .... Take 2 every 6 hrs as needed for muscle spasm in neck 8)  Prednisone (pak) 10 Mg Tabs (Prednisone) .... Use as directed 9)  Omeprazole 20 Mg Cpdr (Omeprazole) .... Take 1 capsule by mouth once a day  Patient Instructions: 1)  Please schedule a follow-up appointment in 6 weeks 2)  You will get injections today for your neck pain and meds are also sent in. 3)  Use the metacarbamol 1 at night only as needed for neck spasm. 4)  You will be referred to DrElsner  about the neck pain in th next 2 weeks  Prescriptions: AMITRIPTYLINE HCL 25 MG  TABS (AMITRIPTYLINE HCL) Take 1 tab by mouth at bedtime  #90 x 3   Entered by:   Everitt Amber LPN   Authorized by:   Syliva Overman MD   Signed by:   Everitt Amber LPN on 13/05/6577   Method used:   Faxed to ...       [facility] (mail-order)             , [place]         Ph: [phone]       Fax: [phone]   RxID:   [id number] MAXZIDE-25 37.5-25 MG  TABS (TRIAMTERENE-HCTZ) Take 1 tablet by mouth once a day  #90 x 3   Entered by:   Everitt Amber LPN   Authorized by:   Syliva Overman MD   Signed by:   Everitt Amber LPN on  04/21/2010   Method used:   Faxed to ...       [facility] (mail-order)             , [place]         Ph: [phone]       Fax: [phone]   RxID:   [id number] OMEPRAZOLE 20 MG CPDR (OMEPRAZOLE) Take 1 capsule by mouth once a day  #30 x 0   Entered and Authorized by:   Syliva Overman MD   Signed by:   Syliva Overman MD on 04/21/2010   Method used:   Electronically to        [facility] [phone]* (retail)       [address]       [place], [place]  [address]       Ph: [phone] or [phone]       Fax: [phone]   RxID:   [id number] PREDNISONE (PAK) 10 MG TABS (PREDNISONE) Use as directed  #21 x 0   Entered and Authorized by:   Syliva Overman MD   Signed by:   Syliva Overman MD on 04/21/2010   Method used:   Electronically to        [facility] [phone]* (retail)       [address]       [place], [place]  [address]       Ph: [phone] or [phone]       Fax: [phone]   RxID:   [id number] DURAGESIC-50 50 MCG/HR PT72 (FENTANYL) applyione patch every 3 days  #10 x 0   Entered by:   Everitt Amber LPN   Authorized by:   Syliva Overman MD   Signed by:   Everitt Amber LPN on 03/50/0938   Method used:   Handwritten   RxID:   [id number]    Medication Administration  Injection # 1:    Medication: Depo- Medrol  80mg     Diagnosis: NECK PAIN, ACUTE (ICD-723.1)    Route: IM    Site: RUOQ gluteus    Exp Date: 01/2011    Lot #: [id number]    Mfr: Pharmacia    Comments: 80mg  given     Patient tolerated injection without complications    Given by: Everitt Amber LPN (April 21, 2010 11:46 AM)  Injection # 2:    Medication: Ketorolac-Toradol 15mg     Diagnosis: NECK PAIN, ACUTE (ICD-723.1)    Route: IM    Site: LUOQ gluteus    Exp Date: 12/2011    Lot #: [id number]     Mfr: novaplus     Comments: 60mg  given     Patient tolerated injection without complications    Given by: Everitt Amber LPN (April 21, 2010 11:47 AM)  Orders Added: 1)  Est. Patient Level IV [25852] 2)  Depo- Medrol 80mg  [J1040] 3)  Ketorolac-Toradol 15mg  [J1885] 4)  Admin of Therapeutic Inj  intramuscular or subcutaneous [96372] 5)  Neurosurgeon Referral [Neurosurgeon]

## 2010-11-05 NOTE — Progress Notes (Signed)
Summary: medicine  Phone Note Call from Patient   Summary of Call: needs his fentanyl 50 micrograms/ hr send to  [facility]   90 day supply call back at [phone] to let him know Initial call taken by: Lind Guest,  July 24, 2010 10:58 AM  Follow-up for Phone Call        not possible, i have told before Follow-up by: Syliva Overman MD,  July 24, 2010 12:26 PM  Additional Follow-up for Phone Call Additional follow up Details #1::        returned call, no answer Additional Follow-up by: Adella Hare LPN,  July 24, 2010 2:22 PM

## 2010-11-05 NOTE — Cardiovascular Report (Signed)
Summary: Office Visit   Office Visit   Imported By: Roderic Ovens 08/07/2010 09:51:27  _____________________________________________________________________  External Attachment:    Type:   Image     Comment:   External Document

## 2010-11-05 NOTE — Medication Information (Signed)
Summary: Tax adviser   Imported By: Lind Guest 09/07/2010 11:41:05  _____________________________________________________________________  External Attachment:    Type:   Image     Comment:   External Document

## 2010-11-05 NOTE — Assessment & Plan Note (Signed)
Summary: FOLLOW UP   Vital Signs:  Patient profile:   77 year old male Height:      68 inches Weight:      180.25 pounds O2 Sat:      98 % on Room air Pulse rate:   71 / minute Pulse rhythm:   regular Resp:     16 per minute BP sitting:   120 / 70  (left arm)  Vitals Entered By: Worthy Keeler LPN (November 10, 2009 11:58 AM)  O2 Flow:  Room air CC: follow-up visit Is Patient Diabetic? No Pain Assessment Patient in pain? no        Primary Care Provider:  Dr. Lod[facility]  CC:  follow-up visit.  History of Present Illness: Reports  that the has been  doing well. Denies recent fever or chills. Denies sinus pressure, nasal congestion , ear pain or sore throat. Denies chest congestion, or cough productive of sputum. Denies chest pain, palpitations, PND, orthopnea or leg swelling. Denies abdominal pain, nausea, vomitting, diarrhea or constipation.  Denies change in bowel movements or bloody stool. Denies dysuria , frequency, incontinence or hesitancy. Chronic  joint pain, and  reduced mobility. Denies headaches, vertigo, seizures.  Denies  rash, lesions, or itch.     Allergies: 1)  ! Codeine  Review of Systems      See HPI Eyes:  Complains of vision loss-both eyes. MS:  Complains of joint pain, low back pain, mid back pain, muscle weakness, and stiffness. Neuro:  Complains of memory loss; denies headaches, seizures, and tingling. Psych:  Complains of anxiety and depression; denies easily angered, irritability, panic attacks, suicidal thoughts/plans, thoughts of violence, and unusual visions or sounds; mild symptoms associted primarily with the dewterioration in function with aging. Endo:  Denies cold intolerance, excessive hunger, excessive thirst, excessive urination, heat intolerance, polyuria, and weight change. Heme:  Denies abnormal bruising and bleeding. Allergy:  Denies hives or rash.  Physical Exam  General:  alert and well-nourished.   well-hydrated.   HEENT: No facial asymmetry,  EOMI, No sinus tenderness, TM's Clear, oropharynx  pink and moist.   Chest: Clear to auscultation bilaterally.  CVS: S1, S2, systolic murmurs, No S3.   Abd: Soft, Nontender.  MS: decreased  ROM spine, hips, shoulders and knees.  Ext: No edema.   CNS: CN 2-12 intact, power tone and sensation normal throughout.   Skin: Intact, no visible lesions or rashes.  Psych: Good eye contact, normal affect.  Memory intact, t anxious and  depressed appearing.    Impression & Recommendations:  Problem # 1:  CENTRAL HEARING LOSS (ICD-389.14) Assessment Deteriorated  Orders: ENT Referral (ENT)  Problem # 2:  HYPERTENSION (ICD-401.9) Assessment: Improved  His updated medication list for this problem includes:    Maxzide-25 37.5-25 Mg Tabs (Triamterene-hctz) .Marland Kitchen... Take 1 tablet by mouth once a day  BP today: 120/70 Prior BP: 140/84 (07/08/2009)  Labs Reviewed: K+: 4.4 (07/08/2009) Creat: : 1.18 (07/08/2009)   Chol: 168 (01/22/2009)   HDL: 63 (01/22/2009)   LDL: 96 (01/22/2009)   TG: 43 (01/22/2009)  Problem # 3:  PANCYTOPENIA (ICD-284.1) Assessment: Comment Only followed by hematology  Problem # 4:  GEN OSTEOARTHROSIS INVOLVING MULTIPLE SITES (ICD-715.09) Assessment: Deteriorated  The following medications were removed from the medication list:    Ibuprofen 600 Mg Tabs (Ibuprofen) .Marland Kitchen... Take one tab once daily    Naproxen Dr 500 Mg Tbec (Naproxen) ..... One tab by mouth two times a day after  meals His updated medication list for this problem includes:    Tramadol Hcl 50 Mg Tabs (Tramadol hcl) .Marland Kitchen... Take 1 tablet by mouth three times a day    Celebrex 200 Mg Caps (Celecoxib) ..... One caop by mouth qd  Problem # 5:  GLAUCOMA (ICD-365.9) Assessment: Deteriorated rewports worsening vision, followed by opthlmology  Problem # 6:  MEMORY LOSS (ICD-780.93) Assessment: Deteriorated pt to start exelon patches daily. also counselled on ways to  improve memory  Complete Medication List: 1)  Maxzide-25 37.5-25 Mg Tabs (Triamterene-hctz) .... Take 1 tablet by mouth once a day 2)  Utira-c 81.6 Mg Tabs (Meth-hyo-m bl-na phos-ph sal) .... Take one tablet by mouth every six hours as needed 3)  Cialis 10 Mg Tabs (Tadalafil) .... Take 1 tablet by mouth once a day as needed 4)  Flomax 0.4 Mg Cp24 (Tamsulosin hcl) .... Take 1 tablet by mouth once a day 5)  Amitriptyline Hcl 25 Mg Tabs (Amitriptyline hcl) .... Take 1 tab by mouth at bedtime 6)  Flexeril 10 Mg Tabs (Cyclobenzaprine hcl) .... Take 1 tab by mouth at bedtime 7)  Tramadol Hcl 50 Mg Tabs (Tramadol hcl) .... Take 1 tablet by mouth three times a day 8)  Elmiron 100 Mg Caps (Pentosan polysulfate sodium) .... Take 2 caps by mouth two times a day 9)  Allopurinol 300 Mg Tabs (Allopurinol) .... One tab by mouth qd 10)  Celebrex 200 Mg Caps (Celecoxib) .... One caop by mouth qd 11)  Exelon 4.6 Mg/24hr Pt24 (Rivastigmine) .... Apply one patch daily to upper chest and upper arm  for one month, start on 11/10/2009 12)  Exelon 9.5 Mg/24hr Pt24 (Rivastigmine) .... Apply one patch daily to upper chest and upper arms start in march 2011  Patient Instructions: 1)  Please schedule a follow-up appointment in 2.5 months. 2)  new meds  are started for memory. 3)  You will be referred for hearing testing. 4)  You need to stop ibuprofen, and naproxen, only celebrex for arthritic pain Prescriptions: EXELON 9.5 MG/24HR PT24 (RIVASTIGMINE) apply one patch daily to upper chest and upper arms start in march 2011  #30 x 2   Entered and Authorized by:   Syliva Overman MD   Signed by:   Syliva Overman MD on 11/10/2009   Method used:   Electronically to        [facility]. [phone]* (retail)       [address]       [place], [place]  [address]       Ph: [phone] or [phone]       Fax: [phone]   RxID:   [id number] EXELON 4.6 MG/24HR PT24 (RIVASTIGMINE) apply one patch  daily to upper chest and upper arm  for one month, start on 11/10/2009  #30 x 0   Entered and Authorized by:   Syliva Overman MD   Signed by:   Syliva Overman MD on 11/10/2009   Method used:   Electronically to        [facility] [phone]* (retail)       [address]       [place], [place]  [address]       Ph: [phone] or [phone]       Fax: [phone]   RxID:   [id number]

## 2010-11-05 NOTE — Progress Notes (Signed)
Summary: call in a rx  Phone Note Call from Patient   Summary of Call: needs to get a rx called into mail order ( triamterene hctz 90day supply)  [phone] Initial call taken by: Rudene Anda,  October 15, 2009 11:02 AM  Follow-up for Phone Call        Phone Call Completed, Rx Called In Follow-up by: Worthy Keeler LPN,  October 15, 2009 11:28 AM    Prescriptions: MAXZIDE-25 37.5-25 MG  TABS (TRIAMTERENE-HCTZ) Take 1 tablet by mouth once a day  #90 x 0   Entered by:   Worthy Keeler LPN   Authorized by:   Syliva Overman MD   Signed by:   Worthy Keeler LPN on 82/95/6213   Method used:   Electronically to        [facility]* (mail-order)             ,          Ph: [phone]       Fax: [phone]   RxID:   [id number]

## 2010-11-05 NOTE — Assessment & Plan Note (Signed)
Summary: office visit   Vital Signs:  Patient profile:   77 year old male Height:      68 inches Weight:      181 pounds BMI:     27.62 O2 Sat:      98 % Pulse rate:   60 / minute Pulse rhythm:   regular Resp:     16 per minute BP sitting:   110 / 70  (left arm)  Vitals Entered By: Everitt Amber LPN (March 24, 2010 9:51 AM)  Nutrition Counseling: Patient's BMI is greater than 25 and therefore counseled on weight management options. CC: Follow up chronic problems   Primary Care Provider:  Dr. [facility]od[facility]  CC:  Follow up chronic problems.  History of Present Illness: Pt reports very little benefit from the duragesic patch he was recently started on , and is requesting a higher dose. He states for the first 2 to 3 days , he experienced a little sedation, however aftert his he reports no adverse side effects. I advised hiom that after one up-titration , if this was not effective , then he wpould need to be treated either through a pain clinic or to be re-evaluated by his surgeon, he understands and agrees. Reports  that he is otherise doing fairly well. Denies recent fever or chills. Denies sinus pressure, nasal congestion , ear pain or sore throat. Denies chest congestion, or cough productive of sputum. Denies chest pain, palpitations, PND, orthopnea or leg swelling. Denies abdominal pain, nausea, vomitting, diarrhea or constipation. Denies change in bowel movements or bloody stool.  Denies headaches, vertigo, seizures. Denies depression, anxiety or insomnia. Denies  rash, lesions, or itch.     Allergies (verified): 1)  ! Codeine  Review of Systems      See HPI Eyes:  Denies discharge and red eye. Endo:  Denies excessive thirst and excessive urination. Heme:  Denies abnormal bruising and bleeding. Allergy:  Denies hives or rash and itching eyes.  Physical Exam  General:  alert and well-nourished.  well-hydrated.  pt in pain HEENT: No facial asymmetry,    EOMI, No sinus tenderness, TM's Clear, oropharynx  pink and moist.   Chest: Clear to auscultation bilaterally.  CVS: S1, S2, systolic murmur, No S3.   Abd: Soft, Nontender.  MS: decreased  ROM spine,adequate in  hips, shoulders and knees.  Ext: No edema.   CNS: CN 2-12 intact, power tone and sensation normal throughout.   Skin: Intact, no visible lesions or rashes.  Psych: Good eye contact, normal affect.  Memory intact, t anxious and  depressed appearing.    Impression & Recommendations:  Problem # 1:  BACK PAIN (ICD-724.5) Assessment Unchanged  The following medications were removed from the medication list:    Flexeril 10 Mg Tabs (Cyclobenzaprine hcl) .Marland Kitchen... Take 1 tab by mouth at bedtime    Tramadol Hcl 50 Mg Tabs (Tramadol hcl) .Marland Kitchen... Take 1 tablet by mouth three times a day    Celebrex 200 Mg Caps (Celecoxib) ..... One caop by mouth qd    Duragesic-25 25 Mcg/hr Pt72 (Fentanyl) .Marland Kitchen... Change every 72 hrs His updated medication list for this problem includes:    Nabumetone 500 Mg Tabs (Nabumetone) .Marland Kitchen... 2 tabs daily    Duragesic-50 50 Mcg/hr Pt72 (Fentanyl) .Marland Kitchen... Applyione patch every 3 days  Problem # 2:  MEMORY LOSS (ICD-780.93) Assessment: Comment Only cintinue exelon patch  Problem # 3:  HYPERTENSION (ICD-401.9) Assessment: Unchanged  His updated medication list for  this problem includes:    Maxzide-25 37.5-25 Mg Tabs (Triamterene-hctz) .Marland Kitchen... Take 1 tablet by mouth once a day  BP today: 110/70 Prior BP: 104/80 (02/18/2010)  Labs Reviewed: K+: 4.4 (07/08/2009) Creat: : 1.18 (07/08/2009)   Chol: 168 (01/22/2009)   HDL: 63 (01/22/2009)   LDL: 96 (01/22/2009)   TG: 43 (01/22/2009)  Problem # 4:  PANCYTOPENIA (ICD-284.1) Assessment: Unchanged  Hgb: 11.1 (01/22/2009)   Hct: 33.3 (01/22/2009)   Platelets: 118 (01/22/2009) RBC: 3.71 (01/22/2009)   RDW: 15.2 (01/22/2009)   WBC: 1.6 (01/22/2009) MCV: 89.8 (01/22/2009)   MCHC: 33.3 (01/22/2009) TSH: 1.683  (01/22/2009)  Complete Medication List: 1)  Maxzide-25 37.5-25 Mg Tabs (Triamterene-hctz) .... Take 1 tablet by mouth once a day 2)  Uretron D/s Tabs (Meth-hyo-m bl-na phos-ph sal) .... One tab three times a day 3)  Cialis 10 Mg Tabs (Tadalafil) .... Take 1 tablet by mouth once a day as needed 4)  Flomax 0.4 Mg Cp24 (Tamsulosin hcl) .... Take 1 tablet by mouth once a day 5)  Amitriptyline Hcl 25 Mg Tabs (Amitriptyline hcl) .... Take 1 tab by mouth at bedtime 6)  Elmiron 100 Mg Caps (Pentosan polysulfate sodium) .... Take 2 caps by mouth two times a day 7)  Allopurinol 300 Mg Tabs (Allopurinol) .... One tab by mouth qd 8)  Exelon 9.5 Mg/24hr Pt24 (Rivastigmine) .... Apply one patch daily to upper chest and upper arms start in march 2011 9)  Nabumetone 500 Mg Tabs (Nabumetone) .... 2 tabs daily 10)  Duragesic-50 50 Mcg/hr Pt72 (Fentanyl) .... Applyione patch every 3 days  Patient Instructions: 1)  F/u in 4 weeks exact. 2)  dose increase on fentanyl to , continue other pain meds as before, no celebrex, tramadol or flexeril Prescriptions: EXELON 9.5 MG/24HR PT24 (RIVASTIGMINE) apply one patch daily to upper chest and upper arms start in march 2011  #90 x 2   Entered by:   Everitt Amber LPN   Authorized by:   Syliva Overman MD   Signed by:   Everitt Amber LPN on 16/07/9603   Method used:   Faxed to ...       [facility] (mail-order)             , [place]         Ph: [phone]       Fax: [phone]   RxID:   [id number] EXELON 9.5 MG/24HR PT24 (RIVASTIGMINE) apply one patch daily to upper chest and upper arms start in march 2011  #90 x 0   Entered by:   Everitt Amber LPN   Authorized by:   Syliva Overman MD   Signed by:   Everitt Amber LPN on 32/44/0102   Method used:   Historical   RxID:   [id number] DURAGESIC-50 50 MCG/HR PT72 (FENTANYL) applyione patch every 3 days  #10 x 0   Entered and Authorized by:   Syliva Overman MD   Signed by:   Syliva Overman MD on 03/24/2010    Method used:   Historical   RxID:   [id number]

## 2010-11-05 NOTE — Assessment & Plan Note (Signed)
Summary: f up   Vital Signs:  Patient profile:   77 year old male Height:      68 inches Weight:      170 pounds BMI:     25.94 O2 Sat:      98 % on Room air Pulse rate:   76 / minute Pulse rhythm:   regular Resp:     16 per minute BP sitting:   122 / 84  (left arm)  Vitals Entered By: Adella Hare LPN (June 02, 2010 10:05 AM)  Nutrition Counseling: Patient's BMI is greater than 25 and therefore counseled on weight management options.  O2 Flow:  Room air CC: follow-up visit Is Patient Diabetic? No Pain Assessment Patient in pain? no      Comments didnt bring meds to ov   Primary Care Provider:  Dr. [facility]od[facility]  CC:  follow-up visit.  History of Present Illness: Reports  thathe is doing better as far as his pain is concerned. He is noted to have incrased drowsiness however. Denies recent fever or chills. Denies sinus pressure, nasal congestion , ear pain or sore throat. Denies chest congestion, or cough productive of sputum. Denies chest pain, palpitations, PND, orthopnea or leg swelling. Denies abdominal pain, nausea, vomitting, diarrhea or constipation. Denies change in bowel movements or bloody stool. Denies dysuria , frequency, incontinence or hesitancy.  Denies headaches, vertigo, seizures. Denies depression, anxiety or insomnia. Denies  rash, lesions, or itch.     Allergies (verified): 1)  ! Codeine  Review of Systems      See HPI General:  Complains of fatigue. Eyes:  Complains of vision loss-both eyes; denies discharge, eye pain, and red eye. MS:  Complains of joint pain, low back pain, mid back pain, and stiffness. Endo:  Denies excessive hunger and excessive thirst. Heme:  Denies abnormal bruising and bleeding. Allergy:  Complains of seasonal allergies; denies hives or rash and itching eyes.  Physical Exam  General:  Well-developed,well-nourished,in no acute distress; alert,appropriate and cooperative throughout  examination HEENT: No facial asymmetry,  EOMI, No sinus tenderness, TM's Clear, oropharynx  pink and moist.   Chest: Clear to auscultation bilaterally.  CVS: S1, S2, No murmurs, No S3.   Abd: Soft, Nontender.  MS: decresed  ROM spine,adequate in hips, shoulders and knees.  Ext: No edema.   CNS: CN 2-12 intact, power tone and sensation normal throughout.   Skin: Intact, no visible lesions or rashes.  Psych: Good eye contact, normal affect.  Memory loss, not anxious or depressed appearing.    Impression & Recommendations:  Problem # 1:  NECK PAIN, ACUTE (ICD-723.1) Assessment Improved  The following medications were removed from the medication list:    Methocarbamol 500 Mg Tabs (Methocarbamol) .Marland Kitchen... Take 2 every 6 hrs as needed for muscle spasm in neck His updated medication list for this problem includes:    Nabumetone 500 Mg Tabs (Nabumetone) .Marland Kitchen... 2 tabs daily    Duragesic-50 50 Mcg/hr Pt72 (Fentanyl) .Marland Kitchen... Applyione patch every 3 days  Problem # 2:  BACK PAIN (ICD-724.5) Assessment: Improved  The following medications were removed from the medication list:    Methocarbamol 500 Mg Tabs (Methocarbamol) .Marland Kitchen... Take 2 every 6 hrs as needed for muscle spasm in neck His updated medication list for this problem includes:    Nabumetone 500 Mg Tabs (Nabumetone) .Marland Kitchen... 2 tabs daily    Duragesic-50 50 Mcg/hr Pt72 (Fentanyl) .Marland Kitchen... Applyione patch every 3 days  Problem # 3:  MEMORY  LOSS (ICD-780.93) Assessment: Comment Only cdurrently getting assistanxce through care  Problem # 4:  HYPERTENSION (ICD-401.9) Assessment: Unchanged  His updated medication list for this problem includes:    Maxzide-25 37.5-25 Mg Tabs (Triamterene-hctz) .Marland Kitchen... Take 1 tablet by mouth once a day  Orders: T-Basic Metabolic Panel ([id number])  BP today: 122/84 Prior BP: 130/82 (04/21/2010)  Labs Reviewed: K+: 4.4 (07/08/2009) Creat: : 1.18 (07/08/2009)   Chol: 168 (01/22/2009)   HDL: 63 (01/22/2009)    LDL: 96 (01/22/2009)   TG: 43 (01/22/2009)  Complete Medication List: 1)  Maxzide-25 37.5-25 Mg Tabs (Triamterene-hctz) .... Take 1 tablet by mouth once a day 2)  Uretron D/s Tabs (Meth-hyo-m bl-na phos-ph sal) .... One tab three times a day 3)  Amitriptyline Hcl 25 Mg Tabs (Amitriptyline hcl) .... Take 1 tab by mouth at bedtime 4)  Allopurinol 300 Mg Tabs (Allopurinol) .... One tab by mouth qd 5)  Nabumetone 500 Mg Tabs (Nabumetone) .... 2 tabs daily 6)  Duragesic-50 50 Mcg/hr Pt72 (Fentanyl) .... Applyione patch every 3 days 7)  Omeprazole 20 Mg Cpdr (Omeprazole) .... Take 1 capsule by mouth once a day  Other Orders: T-Lipid Profile ([id number]) T-TSH ([id number])  Patient Instructions: 1)  Please schedule a follow-up appointment in 4 months. 2)  BMP prior to visit, ICD-9: 3)  Lipid Panel prior to visit, ICD-9:  fasting  asap 4)  TSH prior to visit, ICD-9: 5)  pLs cut amitriptyline in half, if this works then stop all together Prescriptions: DURAGESIC-50 50 MCG/HR PT72 (FENTANYL) applyione patch every 3 days  #10 x 0   Entered by:   Everitt Amber LPN   Authorized by:   Syliva Overman MD   Signed by:   Everitt Amber LPN on 57/84/6962   Method used:   Handwritten   RxID:   [id number]

## 2010-11-05 NOTE — Letter (Signed)
Summary: [facility]   Imported By: Lind Guest 10/30/2010 08:36:55  _____________________________________________________________________  External Attachment:    Type:   Image     Comment:   External Document

## 2010-11-05 NOTE — Progress Notes (Signed)
Summary: [facility]   Imported By: Lind Guest 02/17/2010 14:25:06  _____________________________________________________________________  External Attachment:    Type:   Image     Comment:   External Document

## 2010-11-05 NOTE — Procedures (Signed)
Summary: 6 mth f/u per checkout on 07/22/09/tg   Current Medications (verified): 1)  Maxzide-25 37.5-25 Mg  Tabs (Triamterene-Hctz) .... Take 1 Tablet By Mouth Once A Day 2)  Utira-C 81.6 Mg  Tabs (Meth-Hyo-M Bl-Na Phos-Ph Sal) .... Take One Tablet By Mouth Every Six Hours As Needed 3)  Cialis 10 Mg  Tabs (Tadalafil) .... Take 1 Tablet By Mouth Once A Day As Needed 4)  Flomax 0.4 Mg  Cp24 (Tamsulosin Hcl) .... Take 1 Tablet By Mouth Once A Day 5)  Amitriptyline Hcl 25 Mg  Tabs (Amitriptyline Hcl) .... Take 1 Tab By Mouth At Bedtime 6)  Flexeril 10 Mg  Tabs (Cyclobenzaprine Hcl) .... Take 1 Tab By Mouth At Bedtime 7)  Tramadol Hcl 50 Mg  Tabs (Tramadol Hcl) .... Take 1 Tablet By Mouth Three Times A Day 8)  Elmiron 100 Mg Caps (Pentosan Polysulfate Sodium) .... Take 2 Caps By Mouth Two Times A Day 9)  Allopurinol 300 Mg Tabs (Allopurinol) .... One Tab By Mouth Qd 10)  Celebrex 200 Mg Caps (Celecoxib) .... One Caop By Mouth Qd 11)  Exelon 9.5 Mg/24hr Pt24 (Rivastigmine) .... Apply One Patch Daily To Upper Chest and Upper Arms Start in March 2011  Allergies (verified): 1)  ! Codeine   PPM Specifications Following MD:  Lewayne Bunting, MD     PPM Vendor:  [employer]     PPM Model Number:  [id number]     PPM Serial Number:  [id number] PPM DOI:  12/06/2008      Lead 1    Location: [facility]     DOI: 12/06/2008     Model #: [id number]     Serial #: [id number]     Status: active Lead 2    Location: [facility]     DOI: 12/06/2008     Model #: 2725     Serial #: [id number]     Status: active Lead 3    Location: [facility]     DOI: 12/06/2008     Model #: 2595     Serial #: [id number]     Status: active  Magnet Response Rate:  BOL 85 ERI 65  Indications:  CHF; 2:1 HB   PPM Follow Up Remote Check?  No Battery Voltage:  3.000 V     Battery Est. Longevity:  5.5 years     Pacer Dependent:  Yes       PPM Device Measurements [facility]rium  Amplitude: 2.8 mV, Impedance: 374 ohms, Threshold: 0.5 V at 0.4 msec Right Ventricle  Impedance: 472  ohms, Threshold: 0.5 V at 0.4 msec Left Ventricle  Impedance: 818 ohms, Threshold: 1.0 V at 0.4 msec  Episodes MS Episodes:  11     Percent Mode Switch:  05     Coumadin:  No Ventricular High Rate:  0     Atrial Pacing:  34.2%     Ventricular Pacing:  99.8%  Parameters Mode:  DDD     Lower Rate Limit:  60     Upper Rate Limit:  130 Paced AV Delay:  140     Sensed AV Delay:  120 Next Cardiology Appt Due:  08/11/2010 Tech Comments:  No parameter changes.  Device function normal.  54,123 single PVC's and 683 PVC runs.  11 mode switch episodes, - coumadin.  ROV 6 months with Dr. Ladona Ridgel in [place].   Altha Harm, LPN  Feb 09, 2950 10:20 AM  MD Comments:  AGree with above.

## 2010-11-05 NOTE — Progress Notes (Signed)
  Cardiac faxed to [facility] attn: Constance Goltz @ [phone] [facility]  October 08, 2010 10:05 AM

## 2010-11-05 NOTE — Assessment & Plan Note (Signed)
Summary: office visit   Vital Signs:  Patient profile:   77 year old male Height:      68 inches Weight:      174.75 pounds Pulse rate:   66 / minute Pulse rhythm:   regular Resp:     16 per minute BP sitting:   120 / 84  (right arm)  Vitals Entered By: Adella Hare LPN (September 14, 2010 10:35 AM) CC: follow-up visit Is Patient Diabetic? No Pain Assessment Patient in pain? no      Comments did not bring meds to ov   Primary Care Provider:  Dr. Lodema [facility]  CC:  follow-up visit.  History of Present Illness: Reports  that the has been doing fairly well Denies recent fever or chills. Denies sinus pressure, nasal congestion , ear pain or sore throat. Denies chest congestion, or cough productive of sputum. Denies chest pain, palpitations, PND, orthopnea or leg swelling. Denies abdominal pain, nausea, vomitting, diarrhea or constipation. Denies change in bowel movements or bloody stool. Denies dysuria , frequency, incontinence or hesitancy. Chronic  joint pain,and  reduced mobility. Denies headaches, vertigo, seizures. Denies depression, anxiety or insomnia.      Allergies (verified): 1)  ! Codeine  Review of Systems      See HPI General:  Complains of fatigue. Eyes:  Complains of vision loss-both eyes; denies discharge and halos; glaucoma. ENT:  Complains of decreased hearing. MS:  Complains of joint swelling, loss of strength, low back pain, mid back pain, and muscle weakness; denies joint pain and joint redness. Derm:  Complains of lesion(s); left heel lesion approx 6 months, bled spontaneously x 6 months. Psych:  Denies anxiety and depression. Endo:  Complains of cold intolerance. Heme:  Complains of abnormal bruising and bleeding; pancytopenia, followed by hematology. Allergy:  Denies hives or rash, itching eyes, seasonal allergies, and sneezing.  Physical Exam  General:  Well-developed,well-nourished,in no acute distress; alert,appropriate  and cooperative throughout examination HEENT: No facial asymmetry,  EOMI, No sinus tenderness, TM's Clear, oropharynx  pink and moist.   Chest: Clear to auscultation bilaterally.  CVS: S1, S2,systolic murmur, No S3.   Abd: Soft, Nontender.  MS: decreased  ROM spine,adequate in  hips, shoulders and knees.  Ext: No edema.   CNS: CN 2-12 intact, preduced hearing  Skin: Intact, hyperpigmented maculopapularlesion on let heel Psych: Good eye contact, normal affect.  Memory intact, not anxious or depressed appearing.    Impression & Recommendations:  Problem # 1:  MOLE (ICD-216.9) Assessment Comment Only  Orders: Dermatology Referral (Derma)  Problem # 2:  BACK PAIN (ICD-724.5) Assessment: Improved  His updated medication list for this problem includes:    Nabumetone 500 Mg Tabs (Nabumetone) .Marland Kitchen... 2 tabs daily    Duragesic-50 50 Mcg/hr Pt72 (Fentanyl) .Marland Kitchen... Applyione patch every 3 days  Problem # 3:  MEMORY LOSS (ICD-780.93) Assessment: Comment Only receiving assistance from [facility], which is appreciated  Problem # 4:  HYPERTENSION (ICD-401.9) Assessment: Unchanged  His updated medication list for this problem includes:    Maxzide-25 37.5-25 Mg Tabs (Triamterene-hctz) .Marland Kitchen... Take 1 tablet by mouth once a day  BP today: 120/84 Prior BP: 122/84 (06/02/2010)  Labs Reviewed: K+: 3.9 (06/04/2010) Creat: : 1.34 (06/04/2010)   Chol: 191 (06/04/2010)   HDL: 55 (06/04/2010)   LDL: 126 (06/04/2010)   TG: 48 (06/04/2010)  Complete Medication List: 1)  Maxzide-25 37.5-25 Mg Tabs (Triamterene-hctz) .... Take 1 tablet by mouth once a day 2)  Uretron  D/s Tabs (Meth-hyo-m bl-na phos-ph sal) .... One tab three times a day 3)  Amitriptyline Hcl 25 Mg Tabs (Amitriptyline hcl) .... Take 1 tab by mouth at bedtime 4)  Allopurinol 300 Mg Tabs (Allopurinol) .... One tab by mouth qd 5)  Nabumetone 500 Mg Tabs (Nabumetone) .... 2 tabs daily 6)  Duragesic-50 50 Mcg/hr Pt72 (Fentanyl) ....  Applyione patch every 3 days 7)  Omeprazole 20 Mg Cpdr (Omeprazole) .... Take 1 capsule by mouth once a day 8)  Exelon 9.5 Mg/24hr Pt24 (Rivastigmine) .... Apply one patch daily, then remove 9)  Flomax 0.4 Mg Caps (Tamsulosin hcl) .... One by mouth daily  Patient Instructions: 1)  Please schedule a follow-up appointment in 4 months. 2)  you will be referred to Dr hall about the lesion on your left heel which bled spontaneously , this is impt. 3)  pls keep appt with dr Jerelyn Scott in April as usual. 4)  no med changes at this time   Orders Added: 1)  Est. Patient Level IV [16109] 2)  Dermatology Referral [Derma]

## 2010-11-05 NOTE — Medication Information (Signed)
Summary: Tax adviser   Imported By: Lind Guest 08/05/2010 17:17:53  _____________________________________________________________________  External Attachment:    Type:   Image     Comment:   External Document

## 2010-11-05 NOTE — Progress Notes (Signed)
Summary: mail order  Phone Note Call from Patient   Summary of Call: would like to speak with nurse about getting memory patches [phone] Initial call taken by: Rudene Anda,  June 25, 2010 4:03 PM  Follow-up for Phone Call        wants exelon patch sent to [facility] states he has been using them  not on current med list Follow-up by: Adella Hare LPN,  June 25, 2010 4:31 PM  Additional Follow-up for Phone Call Additional follow up Details #1::        prescription printed pls send Additional Follow-up by: Syliva Overman MD,  June 25, 2010 5:18 PM    New/Updated Medications: EXELON 9.5 MG/24HR PT24 (RIVASTIGMINE) apply one patch daily, then remove Prescriptions: EXELON 9.5 MG/24HR PT24 (RIVASTIGMINE) apply one patch daily, then remove  #90 x 3   Entered by:   Adella Hare LPN   Authorized by:   Syliva Overman MD   Signed by:   Adella Hare LPN on 81/19/1478   Method used:   Electronically to        [facility]* (retail)             ,          Ph: [phone]       Fax: [phone]   RxID:   [id number] EXELON 9.5 MG/24HR PT24 (RIVASTIGMINE) apply one patch daily, then remove  #90 x 3   Entered and Authorized by:   Syliva Overman MD   Signed by:   Syliva Overman MD on 06/25/2010   Method used:   Print[facility] then [facility] ...       [facility]  [address] [phone][facility] (retail)       [address]       [place], [place]  [address]       Ph: [phone] or [phone]       Fax: [phone]   RxID:   [id number]

## 2010-11-05 NOTE — Assessment & Plan Note (Signed)
Summary: FLU SHOT  Nurse Visit  Comments Patient in to recieve flu vaccine      Allergies: 1)  ! Codeine  Orders Added: 1)  Influenza Vaccine MCR [00025]   Influenza Vaccine    Vaccine Type: Fluvax MCR    Site: right deltoid    Mfr: Novartis     Dose: 0.5 ml    Route: IM    Given by: Everitt Amber LPN    Exp. Date: 02/2011    Lot #: [id number]

## 2010-11-05 NOTE — Cardiovascular Report (Signed)
Summary: Office Visit   Office Visit   Imported By: Roderic Ovens 02/17/2010 11:38:58  _____________________________________________________________________  External Attachment:    Type:   Image     Comment:   External Document

## 2010-11-05 NOTE — Assessment & Plan Note (Signed)
Summary: DIZZY PAIN IN EYE   Vital Signs:  Patient profile:   77 year old male Height:      68 inches Weight:      175.75 pounds O2 Sat:      96 % Pulse rate:   66 / minute BP sitting:   118 / 60  (right arm) CC: neck pain started in sholder but has moved to his neck wife thinks it is arthritis room 2   Primary Provider:  Dr. Lodema [facility]  CC:  neck pain started in sholder but has moved to his neck wife thinks it is arthritis room 2.  History of Present Illness: Pt presents today with c/o pain in the Rt side of his neck x approx 3 days.  He states it started a couple days before that across the back of his Rt shoulder.  He denies trauma.  Per pt & his wife he has a hx of arthritis in his lower back.  He is using his pain patch.  He did have some similar pain in his upper back and neck a few yrs ago.  Pt also  states he stopped using his memory patch a couple days ago.  States it was making him feel crazy.  He has been on this for a couple of mos and not had any problems with it until the last couple of days.  He stopped using it and states he is feeling better.  His wife states he really does need it because his memory is bad.  Pt states yesterday for just a couple of minutes he had a pain above his Rt eyebrow.  He denies return of this pain and has not been having HA's.  No dizziness or blurred vision.   Allergies: 1)  ! Codeine  Past History:  Past medical history reviewed for relevance to current acute and chronic problems.  Past Medical History: Reviewed history from 12/31/2008 and no changes required. CHRONIC SYSTOLIC HEART FAILURE (ICD-428.22) MITRAL REGURGITATION, MODERATE (ICD-396.3) HYPERTENSION (ICD-401.9) BRADYCARDIA (ICD-427.89) SYNCOPE (ICD-780.2) SCREENING FOR LIPOID DISORDERS (ICD-V77.91) FATIGUE (ICD-780.79) PANCYTOPENIA (ICD-284.1) RASH AND OTHER NONSPECIFIC SKIN ERUPTION (ICD-782.1) COSTOCHONDRITIS, ACUTE (ICD-733.6) GLAUCOMA  (ICD-365.9) ERECTILE DYSFUNCTION (ICD-302.72) VISUAL CHANGES (ICD-368.10)  Review of Systems General:  Denies chills and fever. CV:  Denies chest pain or discomfort. MS:  Complains of muscle aches, stiffness, and thoracic pain; denies joint pain and joint swelling. Neuro:  Complains of memory loss; denies numbness and tingling.  Physical Exam  General:  alert, well-developed, well-nourished, well-hydrated, appropriate dress, cooperative to examination, and good hygiene.   Head:  Normocephalic and atraumatic without obvious abnormalities. No apparent alopecia or balding. Eyes:  pupils equal, pupils round, and pupils reactive to light.   Ears:  External ear exam shows no significant lesions or deformities.  Otoscopic examination reveals clear canals, tympanic membranes are intact bilaterally without bulging, retraction, inflammation or discharge. Hearing is grossly normal bilaterally. Nose:  External nasal examination shows no deformity or inflammation. Nasal mucosa are pink and moist without lesions or exudates. Mouth:  Oral mucosa and oropharynx without lesions or exudates.   Neck:  No deformities, masses, or tenderness noted. Lungs:  Normal respiratory effort, chest expands symmetrically. Lungs are clear to auscultation, no crackles or wheezes. Heart:  Normal rate and regular rhythm. S1 and S2 normal without gallop, murmur, click, rub or other extra sounds. Msk:  CSpine:  pt is holding head bent forward at the neck, uncertain if this is his usual position.  He has decreased ROM all directions.  He reports TTP RT paraspinal muscles and also Rt supraclavicular muscles, with mild tighness noted. Pulses:  R radial normal and L radial normal.   Extremities:  FROM Rt shoulder.   Neurologic:  sensation intact to light touch.   Cervical Nodes:  No lymphadenopathy noted Psych:  normally interactive, good eye contact, and not anxious appearing.     Impression & Recommendations:  Problem # 1:   NECK PAIN, ACUTE (ICD-723.1)  His updated medication list for this problem includes:    Nabumetone 500 Mg Tabs (Nabumetone) .Marland Kitchen... 2 tabs daily    Duragesic-50 50 Mcg/hr Pt72 (Fentanyl) .Marland Kitchen... Applyione patch every 3 days    Methocarbamol 500 Mg Tabs (Methocarbamol) .Marland Kitchen... Take 2 every 6 hrs as needed for muscle spasm in neck  Orders: Miscellaneous Other Radiology (Misc Other Rad) Depo- Medrol 80mg  (J1040) Ketorolac-Toradol 15mg  (Z6109) Admin of Therapeutic Inj  intramuscular or subcutaneous (60454)  Problem # 2:  MEMORY LOSS (ICD-780.93) Assessment: Comment Only Advised pt to retry his Exelon patches since he used them for a couple of mos without problems.  If he continues to notice problems while using the patch he can discuss with Dr Lodema Hong at his next appt in less than 2 weeks.  Complete Medication List: 1)  Maxzide-25 37.5-25 Mg Tabs (Triamterene-hctz) .... Take 1 tablet by mouth once a day 2)  Uretron D/s Tabs (Meth-hyo-m bl-na phos-ph sal) .... One tab three times a day 3)  Cialis 10 Mg Tabs (Tadalafil) .... Take 1 tablet by mouth once a day as needed 4)  Flomax 0.4 Mg Cp24 (Tamsulosin hcl) .... Take 1 tablet by mouth once a day 5)  Amitriptyline Hcl 25 Mg Tabs (Amitriptyline hcl) .... Take 1 tab by mouth at bedtime 6)  Elmiron 100 Mg Caps (Pentosan polysulfate sodium) .... Take 2 caps by mouth two times a day 7)  Allopurinol 300 Mg Tabs (Allopurinol) .... One tab by mouth qd 8)  Nabumetone 500 Mg Tabs (Nabumetone) .... 2 tabs daily 9)  Duragesic-50 50 Mcg/hr Pt72 (Fentanyl) .... Applyione patch every 3 days 10)  Methocarbamol 500 Mg Tabs (Methocarbamol) .... Take 2 every 6 hrs as needed for muscle spasm in neck  Patient Instructions: 1)  Keep your next appt with Dr Lodema Hong. 2)  I have ordered an xray of your neck. 3)  Continue your current medications. 4)  Retry using your memory patch. 5)  I have prescribed a muscle relaxer for you to use as needed. 6)  You have received 2  shots today to try to help with your neck pain. Prescriptions: METHOCARBAMOL 500 MG TABS (METHOCARBAMOL) take 2 every 6 hrs as needed for muscle spasm in neck  #40 x 0   Entered and Authorized by:   Esperanza Sheets PA   Signed by:   Esperanza Sheets PA on 04/07/2010   Method used:   [facility]        [facility] [place] [phone]* (retail)       [address]       [place], [place]  [address]       Ph: [phone] or [phone]       Fax: [phone]   RxID:   [id number]    Medication Administration  Injection # 1:    Medication: Depo- Medrol 80mg     Diagnosis: NECK PAIN, ACUTE (ICD-723.1)    Route: IM    Site: RUOQ gluteus  Exp Date: 4/12    Lot #: [id number]    Mfr: Pharmacia    Patient tolerated injection without complications    Given by: Adella Hare LPN (April 08, 2835 11:26 AM)  Injection # 2:    Medication: Ketorolac-Toradol 15mg     Diagnosis: NECK PAIN, ACUTE (ICD-723.1)    Route: IM    Site: LUOQ gluteus    Exp Date: 09/04/2011    Lot #: [id number]    Mfr: hospira    Comments: toradol 60mg  given    Patient tolerated injection without complications    Given by: Adella Hare LPN (April 07, 4649 11:27 AM)  Orders Added: 1)  Miscellaneous Other Radiology [Misc Other Rad] 2)  Depo- Medrol 80mg  [J1040] 3)  Ketorolac-Toradol 15mg  [J1885] 4)  Admin of Therapeutic Inj  intramuscular or subcutaneous [96372] 5)  Est. Patient Level IV [35465]

## 2010-11-05 NOTE — Letter (Signed)
Summary: Letter to pharmacy  Letter to pharmacy   Imported By: Lind Guest 03/25/2010 12:58:16  _____________________________________________________________________  External Attachment:    Type:   Image     Comment:   External Document

## 2010-11-05 NOTE — Letter (Signed)
Summary: dermatology  dermatology   Imported By: Lind Guest 10/28/2010 17:26:47  _____________________________________________________________________  External Attachment:    Type:   Image     Comment:   External Document

## 2010-11-05 NOTE — Progress Notes (Signed)
Summary: patches  Phone Note Call from Patient   Summary of Call: did doc call in patches to [facility] [phone] Initial call taken by: Rudene Anda,  July 03, 2010 10:44 AM    Prescriptions: EXELON 9.5 MG/24HR PT24 (RIVASTIGMINE) apply one patch daily, then remove  #90 x 3   Entered by:   Everitt Amber LPN   Authorized by:   Syliva Overman MD   Signed by:   Everitt Amber LPN on 78/46/9629   Method used:   Electronically to        [facility]* (retail)             ,          Ph: [phone]       Fax: [phone]   RxID:   [id number]  They were faxed on the 23rd but I refaxed just to make sure they had recieved them

## 2010-11-05 NOTE — Letter (Signed)
Summary: NARCOTIC CONTRACT  NARCOTIC CONTRACT   Imported By: Lind Guest 02/18/2010 14:53:08  _____________________________________________________________________  External Attachment:    Type:   Image     Comment:   External Document

## 2010-11-05 NOTE — Progress Notes (Signed)
Summary: patches  Phone Note Call from Patient   Summary of Call: needs hi s patches sent to [facility] Initial call taken by: Lind Guest,  November 18, 2009 9:22 AM  Follow-up for Phone Call        Phone Call Completed, Rx Called In Follow-up by: Lilyan Gilford LPN,  November 18, 2009 11:00 AM    Prescriptions: EXELON 9.5 MG/24HR PT24 (RIVASTIGMINE) apply one patch daily to upper chest and upper arms start in march 2011  #90 x 0   Entered by:   Lilyan Gilford LPN   Authorized by:   Syliva Overman MD   Signed by:   Lilyan Gilford LPN on 16/07/9603   Method used:   Electronically to        [facility]* (mail-order)             ,          Ph: [phone]       Fax: [phone]   RxID:   [id number] EXELON 4.6 MG/24HR PT24 (RIVASTIGMINE) apply one patch daily to upper chest and upper arm  for one month, start on 11/10/2009  #30 x 0   Entered by:   Lilyan Gilford LPN   Authorized by:   Syliva Overman MD   Signed by:   Lilyan Gilford LPN on 32/44/0102   Method used:   Electronically to        [facility]* (mail-order)             ,          Ph: [phone]       Fax: [phone]   RxID:   [id number]

## 2010-11-05 NOTE — Progress Notes (Signed)
Summary: refill  Phone Note Call from Patient   Summary of Call: [facility] said they had already sent over to office [phone] Initial call taken by: Rudene Anda,  January 06, 2010 2:22 PM    Prescriptions: MAXZIDE-25 37.5-25 MG  TABS (TRIAMTERENE-HCTZ) Take 1 tablet by mouth once a day  #90 x 3   Entered by:   Everitt Amber LPN   Authorized by:   Syliva Overman MD   Signed by:   Everitt Amber LPN on 81/19/1478   Method used:   Electronically to        [facility]* (mail-order)             ,          Ph: [phone]       Fax: [phone]   RxID:   [id number]

## 2010-11-21 ENCOUNTER — Encounter: Payer: Self-pay | Admitting: Family Medicine

## 2010-12-01 NOTE — Letter (Signed)
Summary: dermatology  dermatology   Imported By: Lind Guest 11/26/2010 10:31:28  _____________________________________________________________________  External Attachment:    Type:   Image     Comment:   External Document

## 2010-12-01 NOTE — Letter (Signed)
Summary: hematology oncology  hematology oncology   Imported By: Lind Guest 11/26/2010 10:30:58  _____________________________________________________________________  External Attachment:    Type:   Image     Comment:   External Document

## 2010-12-11 ENCOUNTER — Telehealth: Payer: Self-pay | Admitting: Family Medicine

## 2010-12-13 ENCOUNTER — Encounter: Payer: Self-pay | Admitting: Family Medicine

## 2010-12-15 ENCOUNTER — Encounter: Payer: Self-pay | Admitting: Family Medicine

## 2010-12-15 NOTE — Progress Notes (Signed)
Summary: medicine  Phone Note Call from Patient   Summary of Call: would like to get doc to call in patches. [phone] 90day supply Initial call taken by: Rudene Anda,  December 11, 2010 11:10 AM    Prescriptions: EXELON 9.5 MG/24HR PT24 (RIVASTIGMINE) apply one patch daily, then remove  #90 x 0   Entered by:   Adella Hare LPN   Authorized by:   Syliva Overman MD   Signed by:   Adella Hare LPN on 28/41/3244   Method used:   Faxed to ...       [facility]order[facility]       [address] [place], [place]  [address]       Ph: [phone]       Fax: [phone]   RxID:   [id number]

## 2010-12-22 NOTE — Letter (Signed)
Summary: oncology and endocrine surgery  oncology and endocrine surgery   Imported By: Lind Guest 12/16/2010 16:05:29  _____________________________________________________________________  External Attachment:    Type:   Image     Comment:   External Document

## 2010-12-22 NOTE — Letter (Signed)
[facility]   Im[facility] By: Lind Guest 12/15/2010 09:04:24  _____________________________________________________________________  External Attachment:    Type:   Image     Comment:   External Document

## 2010-12-22 NOTE — Letter (Signed)
[facility]   Imported By: Lind Guest 12/15/2010 09:00:49  _____________________________________________________________________  External Attachment:    Type:   Image     Comment:   External Document

## 2010-12-23 LAB — CBC
Hemoglobin: 11.8 g/dL — ABNORMAL LOW (ref 13.0–17.0)
MCV: 90.9 fL (ref 78.0–100.0)
RBC: 3.72 MIL/uL — ABNORMAL LOW (ref 4.22–5.81)
WBC: 1.8 10*3/uL — ABNORMAL LOW (ref 4.0–10.5)

## 2010-12-23 LAB — DIFFERENTIAL
Lymphs Abs: 0.5 10*3/uL — ABNORMAL LOW (ref 0.7–4.0)
Monocytes Relative: 15 % — ABNORMAL HIGH (ref 3–12)
Neutro Abs: 0.7 10*3/uL — ABNORMAL LOW (ref 1.7–7.7)
Neutrophils Relative %: 43 % (ref 43–77)

## 2011-01-01 ENCOUNTER — Telehealth: Payer: Self-pay | Admitting: Family Medicine

## 2011-01-01 NOTE — Telephone Encounter (Signed)
Noted  

## 2011-01-07 LAB — CBC
HCT: 34.8 % — ABNORMAL LOW (ref 39.0–52.0)
MCV: 94.3 fL (ref 78.0–100.0)
RBC: 3.69 MIL/uL — ABNORMAL LOW (ref 4.22–5.81)
WBC: 2.3 10*3/uL — ABNORMAL LOW (ref 4.0–10.5)

## 2011-01-07 LAB — DIFFERENTIAL
Eosinophils Absolute: 0.1 10*3/uL (ref 0.0–0.7)
Eosinophils Relative: 3 % (ref 0–5)
Lymphs Abs: 0.5 10*3/uL — ABNORMAL LOW (ref 0.7–4.0)
Monocytes Absolute: 0.2 10*3/uL (ref 0.1–1.0)
Monocytes Relative: 10 % (ref 3–12)

## 2011-01-09 LAB — POCT I-STAT 4, (NA,K, GLUC, HGB,HCT): Hemoglobin: 12.6 g/dL — ABNORMAL LOW (ref 13.0–17.0)

## 2011-01-11 ENCOUNTER — Other Ambulatory Visit (HOSPITAL_COMMUNITY): Payer: Medicare Other

## 2011-01-11 ENCOUNTER — Ambulatory Visit (HOSPITAL_COMMUNITY): Payer: Medicare Other | Admitting: Oncology

## 2011-01-12 ENCOUNTER — Telehealth: Payer: Self-pay | Admitting: Family Medicine

## 2011-01-13 ENCOUNTER — Ambulatory Visit: Payer: Medicare Other | Attending: Surgery | Admitting: Physical Therapy

## 2011-01-13 ENCOUNTER — Encounter: Payer: Self-pay | Admitting: Family Medicine

## 2011-01-13 DIAGNOSIS — I890 Lymphedema, not elsewhere classified: Secondary | ICD-10-CM | POA: Insufficient documentation

## 2011-01-13 DIAGNOSIS — IMO0001 Reserved for inherently not codable concepts without codable children: Secondary | ICD-10-CM | POA: Insufficient documentation

## 2011-01-13 DIAGNOSIS — Z85820 Personal history of malignant melanoma of skin: Secondary | ICD-10-CM | POA: Insufficient documentation

## 2011-01-13 LAB — CBC
Hemoglobin: 11 g/dL — ABNORMAL LOW (ref 13.0–17.0)
RBC: 3.52 MIL/uL — ABNORMAL LOW (ref 4.22–5.81)
RDW: 14.6 % (ref 11.5–15.5)

## 2011-01-13 LAB — DIFFERENTIAL
Basophils Absolute: 0 10*3/uL (ref 0.0–0.1)
Lymphocytes Relative: 32 % (ref 12–46)
Monocytes Absolute: 0.2 10*3/uL (ref 0.1–1.0)
Monocytes Relative: 13 % — ABNORMAL HIGH (ref 3–12)
Neutro Abs: 0.8 10*3/uL — ABNORMAL LOW (ref 1.7–7.7)

## 2011-01-14 ENCOUNTER — Encounter: Payer: Self-pay | Admitting: Family Medicine

## 2011-01-14 LAB — POCT CARDIAC MARKERS
CKMB, poc: 5.4 ng/mL (ref 1.0–8.0)
Troponin i, poc: <0.05 ng/mL (ref 0.00–0.09)

## 2011-01-14 LAB — CBC
Hemoglobin: 12.2 g/dL — ABNORMAL LOW (ref 13.0–17.0)
Hemoglobin: 11.2 g/dL — ABNORMAL LOW (ref 13.0–17.0)
Platelets: 98 10*3/uL — ABNORMAL LOW (ref 150–400)
RBC: 3.54 MIL/uL — ABNORMAL LOW (ref 4.22–5.81)
RDW: 14 % (ref 11.5–15.5)
RDW: 13.7 % (ref 11.5–15.5)
WBC: 1.6 10*3/uL — ABNORMAL LOW (ref 4.0–10.5)

## 2011-01-14 LAB — TROPONIN I
Troponin I: 0.46 ng/mL — ABNORMAL HIGH (ref 0.00–0.06)
Troponin I: 0.55 ng/mL (ref 0.00–0.06)

## 2011-01-14 LAB — DIFFERENTIAL
Basophils Relative: 0 % (ref 0–1)
Eosinophils Absolute: 0.1 10*3/uL (ref 0.0–0.7)
Eosinophils Relative: 3 % (ref 0–5)
Monocytes Absolute: 0.2 10*3/uL (ref 0.1–1.0)
Monocytes Relative: 11 % (ref 3–12)

## 2011-01-14 LAB — COMPREHENSIVE METABOLIC PANEL
ALT: 21 U/L (ref 0–53)
AST: 30 U/L (ref 0–37)
Albumin: 3.7 g/dL (ref 3.5–5.2)
Alkaline Phosphatase: 53 U/L (ref 39–117)
GFR calc Af Amer: 59 mL/min — ABNORMAL LOW (ref >60)
Glucose, Bld: 102 mg/dL — ABNORMAL HIGH (ref 70–99)
Potassium: 4.2 mEq/L (ref 3.5–5.1)
Sodium: 140 mEq/L (ref 135–145)
Total Protein: 6.7 g/dL (ref 6.0–8.3)

## 2011-01-14 LAB — TSH: TSH: 1.879 u[IU]/mL (ref 0.350–4.500)

## 2011-01-14 LAB — LIPID PANEL
LDL Cholesterol: 99 mg/dL (ref 0–99)
Triglycerides: 51 mg/dL (ref <150)
VLDL: 10 mg/dL (ref 0–40)

## 2011-01-14 LAB — BASIC METABOLIC PANEL
BUN: 21 mg/dL (ref 6–23)
Calcium: 9 mg/dL (ref 8.4–10.5)
GFR calc non Af Amer: 51 mL/min — ABNORMAL LOW (ref >60)
Glucose, Bld: 155 mg/dL — ABNORMAL HIGH (ref 70–99)

## 2011-01-14 LAB — CK TOTAL AND CKMB (NOT AT ARMC): Relative Index: 3.2 — ABNORMAL HIGH (ref 0.0–2.5)

## 2011-01-14 LAB — PROTIME-INR: INR: 1.1 (ref 0.00–1.49)

## 2011-01-15 ENCOUNTER — Ambulatory Visit (INDEPENDENT_AMBULATORY_CARE_PROVIDER_SITE_OTHER): Payer: Medicare Other | Admitting: Family Medicine

## 2011-01-15 ENCOUNTER — Encounter: Payer: Self-pay | Admitting: Family Medicine

## 2011-01-15 VITALS — BP 118/74 | HR 66 | Resp 16 | Ht 68.25 in | Wt 168.4 lb

## 2011-01-15 DIAGNOSIS — M549 Dorsalgia, unspecified: Secondary | ICD-10-CM

## 2011-01-15 DIAGNOSIS — F0280 Dementia in other diseases classified elsewhere without behavioral disturbance: Secondary | ICD-10-CM

## 2011-01-15 DIAGNOSIS — I10 Essential (primary) hypertension: Secondary | ICD-10-CM

## 2011-01-15 DIAGNOSIS — K219 Gastro-esophageal reflux disease without esophagitis: Secondary | ICD-10-CM

## 2011-01-15 MED ORDER — FENTANYL 50 MCG/HR TD PT72: 1.0000 | MEDICATED_PATCH | TRANSDERMAL | Status: DC

## 2011-01-15 NOTE — Patient Instructions (Addendum)
F/U in 4 months.  Medications will continue as listed  We will send to the pharmacy you use, pls let us know

## 2011-01-18 ENCOUNTER — Encounter: Payer: Self-pay | Admitting: Family Medicine

## 2011-01-18 ENCOUNTER — Ambulatory Visit: Payer: Medicare Other | Admitting: Physical Therapy

## 2011-01-18 NOTE — Progress Notes (Signed)
  Subjective:    Patient ID: [name], male    DOB: Nov 27, 1925, 77 y.o.   MRN: [id number]  HPI The PT is here for follow up and re-evaluation of chronic medical conditions, medication management and review of recent lab and radiology data.  Preventive health is updated, specifically  Cancer screening,  and Immunization.   [name] has had quite an eventful 4 months since his last visit. He has been diagnosed and succesfuly treated for malignant melanoma at [facility], where he has also turned over his heamtologic care also. He has been followed locall for pancytopenia for years. He states he feels fairly well overall, his wife reports easy agitiation and irritability at times, which he denies, he uses the exelon patch consistently.  Review of Systems Denies recent fever or chills.He does have chronic fatigue.  Denies sinus pressure, nasal congestion, ear pain or sore throat. Denies chest congestion, productive cough or wheezing. Denies chest pains, palpitations, paroxysmal nocturnal dyspnea, orthopnea and leg swelling Denies abdominal pain, nausea, vomiting,diarrhea or constipation.  Denies rectal bleeding or change in bowel movement. Denies dysuria,  Denies joint pain or  swelling , he does have chronic back pain with limitation in mobility. He is requesting celebrex, however nabumatone which he already has is deemed safer in my opinion. Denies headaches, seizure, numbness, or tingling. Denies depression,he does have mild  anxiety and  insomnia. Denies skin break down or rash.         Objective:   Physical Exam Patient alert and oriented and in no Cardiopulmonary distress.  HEENT: No facial asymmetry, EOMI, no sinus tenderness, TM's clear, Oropharynx pink and moist.  Neck supple no adenopathy.  Chest: Clear to auscultation bilaterally.  CVS: S1, S2 no murmurs, no S3.  ABD: Soft non tender. Bowel sounds normal.  Ext: No edema  MS: decreased ROM spine, shoulders, hips and  knees.  Skin: Intact, no ulcerations or rash noted.  Psych: Good eye contact, normal affect. Memory loss not anxious or depressed appearing.  CNS: CN 2-12 intact, power, tone and sensation normal throughout.        Assessment & Plan:  1.Hypertension:Controlled, no changes in medication.  2.Dementia: pt to continue exelon patch, and already linked with community help 3.Chronic back pain: med to remain the same 4.GERD :Stable on med 5.Malignant melanoma: surgical cure from available records , pt to continue to folow at [facility]

## 2011-01-20 ENCOUNTER — Ambulatory Visit: Payer: Medicare Other | Admitting: Physical Therapy

## 2011-01-22 ENCOUNTER — Ambulatory Visit: Payer: Medicare Other | Admitting: Physical Therapy

## 2011-01-25 ENCOUNTER — Ambulatory Visit: Payer: Medicare Other | Admitting: Physical Therapy

## 2011-01-26 ENCOUNTER — Encounter: Payer: Medicare Other | Admitting: Physical Therapy

## 2011-02-01 NOTE — Telephone Encounter (Signed)
error 

## 2011-02-16 NOTE — H&P (Signed)
NAMELANDO, ALCALDE NO.:  [id number]   MEDICAL RECORD NO.:  [id number]          PATIENT TYPE:  INP   LOCATION:  2027                         FACILITY:  [facility]   PHYSICIAN:  Doylene Canning. Ladona Ridgel, MD    DATE OF BIRTH:  Oct 31, 1947   DATE OF ADMISSION:  12/05/2008  DATE OF DISCHARGE:                              HISTORY & PHYSICAL   PRIMARY CARDIOLOGIST:  Doylene Canning. Ladona Ridgel, MD   PRIMARY MEDICAL DOCTOR:  Milus Mallick. Lodema Hong, MD   CHIEF COMPLAINT:  Mobitz 2:1 conduction AV block.   HISTORY OF PRESENT ILLNESS:  This is a 77 year old African American male  with a history of neurogenic bladder, (status post bladder pacemaker),  hypertension, erectile dysfunction, who presents with 7-10 days of  lightheadedness, fatigue, and lower extremity pain with exertion.  The  patient was in his usual state of health up until 7-10 days ago, when he  noted symptoms as above.  The patient denies chest pain, shortness of  breath, nausea, vomiting, fever, chills, orthopnea, paroxysmal nocturnal  dyspnea, lower extremity edema, diarrhea, constipation or bleeding.  The  patient went to see his primary care physician and was found to be in  Mobitz with 2:1 conduction.  The patient transferred from [facility] to [facility] for pacemaker placement tomorrow.  At  the time of this evaluation, the patient was asymptomatic and was  stable.   PAST MEDICAL HISTORY:  1. Hypertension.  2. Neurogenic bladder (status post bladder pacemaker in 1998).  3. Erectile dysfunction.  4. History of disk prolapse (status post 3 times back surgeries      starting in 1970s and ending in the 1990s).  5. History of pancytopenia.   SOCIAL HISTORY:  The patient lives in [place] on a farm with his wife.  He is a retired Psychiatric nurse.  He had a 30-pack-year smoking history,  but has not smoked over 20 years.  He drinks no alcohol and takes no  illicit drugs.  No herbal medications.  He has a  regular diet and no  regular exercise.  The most activity he does when he cuts his grass in  the summer, which he does regularly.   FAMILY HISTORY:  The mother deceased at age 77 with Alzheimer.  Father  deceased at age 75 with cancer.  Siblings, 2 brothers and 4 sister and  none with coronary artery disease.   REVIEW OF SYSTEMS:  See HPI.  All other systems were reviewed and were  negative.   ALLERGIES:  CODEINE.   MEDICATIONS:  1. Maxzide 35.5/25 mg one-half tablet p.o. daily.  2. Ultra-C 81.6 mg p.r.n.  3. Cialis 10 mg p.o. p.r.n.  4. Tramadol 50 mg p.o. t.i.d.  5. Ibuprofen 600 mg p.o. daily.  6. Flomax 0.4 mg p.o. daily.  7. Amitriptyline 25 mg p.o. q.h.s.   PHYSICAL EXAMINATION:  VITAL SIGNS:  Temperature 98.0 degrees  Fahrenheit, BP 144/72, pulse 48, respiration rate 20, and O2 saturation  99% on 2 L by nasal cannula.  GENERAL:  He is alert and oriented  x3 in no apparent distress.  He spoke  and moved easily without respiratory distress.  HEENT:  Head was normocephalic, atraumatic.  Pupils equal, round, and  reactive to light.  Extraocular muscles were intact.  Nares were patent  without discharge.  Oropharynx without erythema or exudate.  NECK:  Supple without lymphadenopathy.  No thyromegaly.  No JVD.  No  bruits.  HEART:  Rate was regular.  However, bradycardic in the 40s.  Audible S1  and S2.  No clicks, rubs, murmurs, or gallops.  EXTREMITIES:  Pulses were 2+ and equal in both upper and lower  extremities bilaterally.  LUNGS:  Clear to auscultation bilaterally.  SKIN:  No rashes, lesions, or petechiae.  ABDOMEN: Soft, nontender, and nondistended.  Normal abdominal bowel  sounds.  No rebound or guarding.  No hepatosplenomegaly and no  pulsations.  EXTREMITIES:  No clubbing, cyanosis, or edema.  MUSCULOSKELETAL:  No joint deformity or effusions.  No spinal or CVA  tenderness.  NEUROLOGIC:  Cranial nerves II through XII are grossly intact.  Strength  is 5/5 in  all extremities and axial groups.  Normal sensation  throughout.  Normal cerebellar function.   RADIOLOGY:  The patient had a chest x-ray that showed stable  radiographic disease.  The patient had an echocardiogram in October 2007  that showed a left ventricular ejection fraction estimated at 45%,  hypokinesis of the inferior septum, and akinesis of the inferior wall  base and also hypokinesis of the distal posterior wall.  Intraventricular septum markedly thickened at the base less so in the  mid section of base.  Posterior wall thickness 12 mm.  Left atrium and  aortic root mildly dilated.  Aortic valve mildly calcified with mild  insufficiency 1/4 mitral valve with bivalve prolapse and moderate  regurgitation 2/4, pulmonary valve and tricuspid valve with mild  insufficiency.   LABORATORY DATA:  WBC 1.9, HGB 12.2, HCT 34.8, and PLT count 98.  Sodium  140, potassium 4.2, chloride 101, CO2 30, BUN 26, creatinine 1.41, and  glucose 102.  Point-of-care markers negative.   ASSESSMENT AND PLAN:  1. This is an 77 year old African American male with history of      neurogenic bladder, hypertension, and erectile dysfunction,      presenting with Mobitz with 2:1 atrioventricular conduction.  The      patient is stable so we just admitted to the telemetry, continue      his home medications will be n.p.o. after midnight and pacemaker      placement planning, we will continue for tomorrow.  We will check a      PT/INR, check a magnesium level.  We will do a CBC and a BMET in      the morning.  We will also check TSH and fasting lipids.  2. Hypertension.  In spite of his bradycardia, the patient's BP is      stable, still slightly elevated.  We will be continue on his home      medications and need to follow.  3. Elevated creatinine up to 1.41 last checked.  We will recheck in      the morning and assuming vital signs remained stable, we will not      replenish IV fluids until after pacemaker  placement.  4. Pancytopenia, stable per notes.  We will check a CBC in the morning      and continue to follow.      Jarrett Ables, [facility]  Doylene Canning. Ladona Ridgel, MD  Electronically Signed    MS/MEDQ  D:  12/05/2008  T:  12/06/2008  Job:  [id number]

## 2011-02-16 NOTE — Letter (Signed)
May 28, 2008    [name]. Lodema Hong, M.D.  [address]  [place], [place] [address]   RE:  [name], [name]  MRN:  [id number]  /  DOB:  November 04, 1925   Dear [name],   It was my pleasure evaluating [name] in consultation today in the  office at your request for chest pain and orthostatic dizziness.  As you  know, this nice gentleman has enjoyed generally good health.  Other than  hypertension, he has no known cardiovascular disease.  He has never  previously been seen by a cardiologist or undergone any significant  cardiac testing.  He describes episodes of moderate dizziness that occur  when he has been squatting and then stands up suddenly.  These last for  no more than 30-45 seconds and had not troubled him greatly.  He does  not feel as if he is about to lose consciousness or fall.  He has had  chest discomfort for the past month, since he did experience the fall  while working with some lifestyle.  This clearly was not caused by loss  of consciousness.  Initial x-rays reportedly show no bony abnormalities.  He continues to have pain with movement of the left upper extremity with  some tenderness.   Otherwise, his past medical history is most notable for urologic  problems managed by Dr. Logan Bores.  He has also had gout.  His last CBC from  few months ago shows moderate anemia.   CURRENT MEDICATIONS:  1. Tramadol 50 mg t.i.d.  2. Utira-C 1 q.i.d.  3. Flomax 0.4 mg daily.  4. Allopurinol 300 mg daily.  5. Triamterene/HCTZ 37.5/25 mg daily.  6. Flexeril 10 mg daily.  7. Amitriptyline 25 mg nightly.  8. Celebrex 200 mg daily.   ALLERGIES:  Allergy to CODEINE is reported.   SURGICAL PROCEDURES:  Implantation of a bladder stimulator,  tonsillectomy, and 3 surgical procedures to the lower back.   SOCIAL HISTORY:  Retired; married with 2 children and lives locally.   FAMILY HISTORY:  Positive for neoplastic disease and Alzheimer's in his  mother.   REVIEW OF  SYSTEMS:  Notable for need for corrective lenses, glaucoma of  the left eye, some hearing impairment, upper and lower dentures, having  been told of a heart murmur previously, urinary frequency, and arthritic  discomfort in his back.  All other systems were reviewed and are  negative.   PHYSICAL EXAMINATION:  GENERAL:  Pleasant elderly gentleman in no acute  distress.  VITAL SIGNS:  Weight 181; blood pressure 100/60 lying, 90/60 sitting,  and 70/50 standing without symptoms; heart rate 60 and regular, and  respirations 14.  NECK:  No jugular venous distension; normal carotid upstrokes without  bruits.  HEENT:  Mild arcus; EOMs full; normal oral mucosa.  LUNGS:  Decreased breath sounds at the bases; few rales.  CARDIAC:  Distant first and second heart sounds; PMI, not palpable.  ABDOMEN:  Soft and nontender; no masses; no organomegaly.  EXTREMITIES:  Trace edema; distal pulses intact.  NEUROLOGICAL:  Symmetric strength and tones; normal cranial nerves.   EKG:  Normal sinus rhythm; left atrial abnormality; left axis; left  bundle-branch block.   Most recent CBC from April 2009, at which time, white count was 2400,  hemoglobin 10.4, hematocrit 30.3, MCV 89, and platelet count 95.   IMPRESSION:  [name] has symptoms of orthostatic hypotension and  findings on physical examination to support this diagnosis.  He has been  treated with 2 alpha-adrenergic blockers for unclear reasons.  Doxazosin  will be discontinued.  Flomax will be continued for the time being.  His  dose of diuretic will be halved.  He was cautioned regarding his  orthostatic hypotension and the risk of syncope.   He has a left bundle branch block that is not described in previous  medical notes.  At his age, no intervention is warranted for this  finding.  He has additional conduction system disease, manifested by his  abnormal axis and first-degree atrioventricular block .   His chest discomfort is almost  certainly related to a soft tissue  injury, but we will perform a CT scan without contrast of the sternum to  rule out an occult fracture.   I will plan to reassess this nice gentleman in 2 weeks.  A repeat CBC  and chemistry profile are pending.    Sincerely,      Gerrit Friends. Dietrich Pates, MD, [facility]  Electronically Signed    RMR/MedQ  DD: 05/28/2008  DT: 05/29/2008  Job #: [id number]

## 2011-02-16 NOTE — Letter (Signed)
June 18, 2008    Milus Mallick. Lodema Hong, M.D.  [address]  [place], [place] [address]   RE:  [name], [name]  MRN:  [id number]  /  DOB:  02/09/1926   Dear [name]:   Mr. Fawbush returns to the office for assessment of number of issues, most  notably orthostatic dizziness and chest discomfort following a fall.  He  reports that his dizziness and his chest pain have both improved.  He  has been more careful about changing body position, but we also modified  his antihypertensive regime.  He kept a record of blood pressures at  home, but did not bring them with him.  These are reportedly good.   MEDICATIONS:  Otherwise are unchanged, except for the addition of  Elmiron 200 mg b.i.d.   PHYSICAL EXAMINATION:  GENERAL:  Pleasant lanky gentleman in no acute  distress.  VITAL SIGNS:  The weight is 181, stable.  Blood pressure 120/85, heart  rate 66 and regular, and respirations 14.  NECK:  No jugular venous distention; no carotid bruits.  LUNGS:  Clear.  CARDIAC:  Normal first and second heart sounds; fourth heart sound  present.  ABDOMEN:  Soft and nontender; no organomegaly.  EXTREMITIES:  No edema.   Mr. Zeiner and I discussed his erectile dysfunction.  He has been  evaluated for this in the past by Dr. Logan Bores.  Viagra has not proved  efficacious, but he uses an external pump with good results.  I advised  him that this appears to be a good solution for him.  I also discussed  his pancytopenia with Dr. Mariel Sleet.  We agreed to stop allopurinol for  the time being.  Mr. Oliff has only had one episode of gout and has a  possible gouty nodule over the DIP joint of 1 finger.   His CT scan of the chest did demonstrate a sternal fracture that was  nondisplaced.  This appears to be healing without incident.   Thanks so much for allowing me to see this very nice gentleman.  I do  not think he needs routine Cardiology followup.  Please call me if I can  assist with his care in the  future.    Sincerely,      Gerrit Friends. Dietrich Pates, MD, [facility]  Electronically Signed    RMR/MedQ  DD: 06/18/2008  DT: 06/19/2008  Job #: [id number]   CC:    Ladona Horns. Mariel Sleet, MD

## 2011-02-16 NOTE — Op Note (Signed)
NAMEFURKAN, [name]                  ACCOUNT NO.:  [id number]   MEDICAL RECORD NO.:  [id number]          PATIENT TYPE:  AMB   LOCATION:  NESC                         [facility]ACILITY[facility]   PHYSICIAN:  Martina Sinner, MD DATE OF BIRTH:  1925-11-02   DATE OF PROCEDURE:  05/15/2009  DATE OF DISCHARGE:                               OPERATIVE REPORT   PREOPERATIVE DIAGNOSIS:  Malfunctioning InterStim with no effective  battery.   POSTOPERATIVE DIAGNOSES:  Malfunctioning InterStim with no effective  battery.   PROCEDURE:  Revision of InterStim; replacement of generator.   SURGEON:  Martina Sinner, M.D.   INDICATIONS:  [name] is a patient with nocturia and frequency, a  patient of Dr. Jamison Neighbor, with an InterStim device.  His nocturia  is significant.  His InterStim is no longer working.  With trouble  shooting, the lead was in good position, but the battery was dead.   DESCRIPTION OF PROCEDURE:  The patient was draped in the usual fashion.  A 10-minute scrub was utilized.  Antibiotics were given.  The incision  was marked on the right hip.  The floor fluoroscopy was used to take a  hard copy film of the AP and lateral, as the starting point.   After this, I opened the incision and delivered the generator through  the incision.  I carefully went down using cutting current and scalpel,  entering its pseudocapsule.  The lead was actually draping across the  middle aspect of the small generator, turned upside down, but was not  injured.  I sharply dissected it free.  I delivered the generator and  the lead intact.  I used a screwdriver to separate the lead from the  generator.   The new generator was applied.  A screwdriver was utilized.  Impedance  checks were all done and there were excellent.  The generator had been  replaced back in an upside down position, with the wire underneath and  the Medtronic labeling facing upward.   Copious irrigation with antibiotics  was utilized.  Then 3-0 Vicryl was  used for the subcutaneous tissue and 4-0 Vicryl subcuticular was used  for the skin.  Telfa dressing with Steri-Strips and waterproof dressing  were applied.   DISPOSITION:  [name] is to remove the dressing in five days, and I  will see him in approximately two weeks.  My nurses will call him  tomorrow.           ______________________________  Martina Sinner, MD  Electronically Signed     SAM/MEDQ  D:  05/15/2009  T:  05/15/2009  Job:  [id number]

## 2011-02-16 NOTE — Op Note (Signed)
[name], [name] NO.:  [id number]   MEDICAL RECORD NO.:  [id number]          PATIENT TYPE:  INP   LOCATION:  2027                         FACILITY:  [facility]   PHYSICIAN:  Doylene Canning. Ladona Ridgel, MD    DATE OF BIRTH:  1925/10/11   DATE OF PROCEDURE:  12/06/2008  DATE OF DISCHARGE:                               OPERATIVE REPORT   PROCEDURE PERFORMED:  Implantation of a biventricular pacemaker.   INDICATIONS:  Symptomatic 2:1 heart block with a rate of 30 beats per  minute, class III heart failure, and left bundle branch block, QRS  duration of 180 msec, all in the setting of variable LV dysfunction with  EF as low as 35% and as high as 55%.   INTRODUCTION:  The patient is an 77 year old man who has a history of  prior myocardial infarction with a history of LV dysfunction.  He has a  history of bradycardia with high-grade heart block, who presented to the  hospital with severe heart failure symptoms.  He was found to be 2:1  heart block and despite maximum medical therapy and despite no AV nodal  block and drugs.  He is now referred for BiV pacemaker insertion.   PROCEDURE:  After informed consent was obtained, the patient was taken  to the diagnostic EP lab in a fasting state.  After usual preparation  and draping, intravenous fentanyl and midazolam was given for sedation.  Lidocaine 30 mL was infiltrated in the left infraclavicular region.  A 6-  cm incision was carried out over this region.  Electrocautery was  utilized to dissect down to the fascial plane.  The left subclavian vein  was punctured x3.  The innominate vein that joined the superior vena  cava was very tortuous and required long sheath to advance the leads.  Having accomplished this, the Medtronic model 5076-58 cm active-fixation  pacing lead, serial #[id number] was advanced into the right ventricle.  After 1 microdislodgement, the lead was actively fixed with R waves of  15, the impendence was 900  ohms, threshold was of 0.4 volt.  There was a  large entry current, a 10 volt pacing did not stimulate the diaphragm  with the lead in the R wave apical septum.  With this lead in  satisfactory position, attention was turned for placement of the atrial  lead, which was placed in the anterolateral portion of the right atrium  where P waves measured 4 millivolts, pacing impedance was 580 ohms, and  threshold 0.8 volts at 0.5 msec.  A 10 volt pacing did not stimulate the  diaphragm.  Again a large entry current was noted.  With both the atrial  and ventricular leads in satisfactory position, they were secured to the  subpectoralis fascia with a figure-of-eight silk suture.  Attention was  then turned for placement of the LV lead.  The coronary sinus guiding  catheter was advanced over a slick wire into the right atrium.  The  coronary sinus was cannulated without particular difficulty and a 6-  [place] HexaPolar EP catheter was advanced into  the coronary sinus  proper.  The guiding catheter was advanced over the EP catheter without  difficulty.  Venography of the coronary sinus was carried out  demonstrating a fairly extensive coronary sinus dissection.  The  patient, however, during this time, remained hemodynamically stable and  had no chest pain.  A slick wire was advanced into the coronary sinus  and out into a beautiful lateral vein.  A subselective catheter was  advanced over the slick wire and a Mailman 0.014 guidewire was advanced  into the vein itself.  The Medtronic model [id number] cm LV lead, serial  #[id number] was then advanced into the lateral vein and at this  location, the LV lead measured 12 millivolts, the pacing threshold was  less than 2 volts at 0.5 msec and there was no diaphragmatic stimulation  at 10 volt pacing.  With these satisfactory parameters, the lead was  secured to the subpectoralis fascia with figure-of-eight silk suture.  The sewing sleeve was also secured with  silk suture.  Electrocautery was  utilized to make subcutaneous pocket.  Kanamycin irrigation was utilized  to irrigate the pocket and electrocautery was utilized to assure  hemostasis.  The Medtronic Insync III dual chamber biventricular  pacemaker, serial [id number] was connected to the atrial, RV and LV  leads and placed back in the subcutaneous pocket.  Generous was secured  with silk suture.  Additional gentamicin was then utilized to irrigate  the pocket.  The incision was closed with 2-0 Vicryl and 3-0 Vicryl.  Benzoin was painted on the skin.  Steri-Strips were applied.  A pressure  dressing was placed, and the patient was returned to his room in  satisfactory condition.  Prior to transfer back to the patient's room, a  2-D echo was obtained and results are pending at the time of dictation.   COMPLICATIONS:  There were no immediate procedure complications and full  results demonstrated successful BiV pacemaker insertion in the patient  with high-grade heart block, severe congestive heart failure, left  bundle branch block, and a QRS duration of 180 msec, all in the setting  of variable LV dysfunction based on sequential echos.      Doylene Canning. Ladona Ridgel, MD  Electronically Signed     Doylene Canning. Ladona Ridgel, MD  Electronically Signed    GWT/MEDQ  D:  12/06/2008  T:  12/07/2008  Job:  [id number]   cc:   Milus Mallick. Lodema Hong, M.D.

## 2011-02-16 NOTE — Discharge Summary (Signed)
NAMEALEXANDRE, [name] NO.:  [id number]   MEDICAL RECORD NO.:  [id number]          PATIENT TYPE:  INP   LOCATION:  2027                         FACILITY:  [facility]   PHYSICIAN:  Pricilla Riffle, MD, FACCDATE OF BIRTH:  21-Sep-1926   DATE OF ADMISSION:  12/05/2008  DATE OF DISCHARGE:  12/08/2008                               DISCHARGE SUMMARY   DISCHARGE DIAGNOSES:  1. Symptomatic atrioventricular block (2:1)      a.     Status post Medtronic biventricular pacemaker.      b.     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  2. Chronic systolic heart failure.      a.     New [place] Heart Association class III.  3. Moderate mitral regurgitation.   SECONDARY DIAGNOSES:  1. Hypertension.  2. History of pancytopenia.   PROCEDURES:  1. Implantation of Medtronic biventricular pacemaker, by Dr. Lewayne Bunting, on December 06, 2008.  2. 2-D echocardiogram.   REASON FOR ADMISSION:  [name] is an 77 year old male, with history of  neurogenic bladder (status post bladder pacemaker) and hypertension, who  was transferred directly from [facility], following  presentation with second-degree Mobitz II block with 2:1 conduction.  He  was transferred for consideration of permanent pacemaker implantation.   The patient underwent successful placement of a Medtronic biventricular  device, by Dr. Ladona Ridgel.  The procedure was, however, complicated by  dissection of the coronary sinus, but no immediate sequela.  A 2-D  echocardiogram was obtained, indicating no evidence of pericardial  effusion.   Postoperative interrogation of the device indicated all values within  normal limits, and no adjustments were necessary.   Given the noted complication, the patient was kept for additional  observation.  Of note, there was some mild elevation of the troponins  noted, with marginally elevated MBs.  Total CPKs remained normal.  Troponins noted  as 0.46 and 0.55, after procedure.   The patient was cleared for discharge on hospital day #3, in  hemodynamically stable condition.   DISCHARGE LABORATORY DATA:  None.   NOTABLE LABORATORY DATA:  Total cholesterol 164, triglyceride 51, HDL  55, and LDL 99.  Peak CPK 196/6.2; troponin I of 0.46 and 0.55.  WBC  1.6, hemoglobin 11.2, hematocrit 32, and platelet 88 on admission.   DISCHARGE MEDICATIONS:  1. Maxzide 1/2 tablet daily.  2. Ultram 50 mg t.i.d.  3. Ibuprofen 600 mg daily.  4. Flomax 0.4 mg daily.  5. Elavil 25 mg nightly.  6. Flexeril 10 mg nightly.   INSTRUCTIONS:  1. The patient is to refer to the supplemental discharge sheet for      pacemaker/defibrillator patients, with instructions as outlined.  2. [name] is to follow up with the [facility] in proximally 2 weeks.  Arrangements to be made through our      office.  3. The patient will also follow up with Dr. Lewayne Bunting in  approximately 4 weeks, for continued close monitoring.   DISCHARGE ENCOUNTER:  Greater than 30 minutes' duration, including  physician time.      [name], PA-C      Pricilla Riffle, MD, [facility]  Electronically Signed    GS/MEDQ  D:  12/08/2008  T:  12/09/2008  Job:  [id number]   cc:   Milus Mallick. Lodema Hong, M.D.

## 2011-02-19 NOTE — Op Note (Signed)
NAME:  Brian, [name]                  ACCOUNT NO.:  [id number]   MEDICAL RECORD NO.:  [id number]          PATIENT TYPE:  AMB   LOCATION:  DAY                           FACILITY:  [facility]   PHYSICIAN:  R. Roetta Sessions, M.D. DATE OF BIRTH:  11-12-1925   DATE OF PROCEDURE:  08/18/2006  DATE OF DISCHARGE:                                 OPERATIVE REPORT   PROCEDURE:  Colonoscopy.   ENDOSCOPIST:  Gerrit Friends. Rourk, M.D.   INDICATIONS FOR PROCEDURE:  The patient is a 77 year old gentleman who was  sent over out of the courtesy of Dr. Syliva Overman for a colonoscopy.  He  is referred for colorectal cancer screening.  His last colonoscopy was years  ago.  No family history of colorectal neoplasia.  He is chronically  constipated and has been taking narcotics for back pain.  He has a [employer]  bladder inhibitor which has been turned off per equipment rep's  recommendations prior to the procedure.  No history of prostate cancer or  pelvic radiation.  Colonoscopy is now being done as a screening maneuver.  This approach has been discussed with the patient at length.  The potential  risks, benefits, and alternatives have been reviewed; questions answered.  He is agreeable.  Please see the documentation in the medical record.   PROCEDURE NOTE:  O2 saturation, blood pressure, pulse, and respirations were  monitored throughout the entire procedure.   CONSCIOUS SEDATION:  Versed 5 mg IV, Demerol 100 mg IV in divided doses.   INSTRUMENT:  Olympus video chip system.   FINDINGS:  Digital rectal exam revealed no abnormalities.   ENDOSCOPIC FINDINGS:  The prep was marginal-to-poor.   RECTUM:  Examination of the rectal mucosa including a retroflex view of the  anal verge reveals somewhat diffuse granularity of the rectum with a bit  more inflammatory changes than we would see.  There were no erosions.  Please see photos.   COLON:  The colonic mucosa was surveyed from the rectosigmoid junction  through the left transverse, right colon, to the area of the appendiceal  orifice, ileocecal valve, and cecum. These structures were well seen and  photographed for the record.  The patient had a long tortuous colon and  granular liquid stool throughout the colon making the exam much more  difficult.  A number of maneuvers had to be employed including changing of  the patient's position, external abdominal pressure to reach the cecum. From  this level, the scope was slowly withdrawn.  All previously mentioned  mucosal surfaces were again seen.  The lumen was copiously irrigated and  suctioned to clear the stool.  [name] had a long redundant colon, but the  mucosa, otherwise appeared normal.  He appeared to have some inflammatory  changes of the rectal mucosa for which biopsies were taken.   The patient tolerated the procedure well and was reacted in endoscopy.   IMPRESSION:  Some granularity and erosions of the rectum of uncertain  significance biopsied.  A long redundant, but otherwise normal-appearing  colon.   RECOMMENDATIONS:  1. Mr.  Mineo is chronically constipated.  We will add MiraLax 17 gm orally      at bedtime to his regimen.  2. Followup on path.  3. Further recommendations to follow.      [name], M.D.  Electronically Signed     RMR/MEDQ  D:  08/18/2006  T:  08/18/2006  Job:  [id number]   cc:   Milus Mallick. Lodema Hong, M.D.  Fax: [phone]

## 2011-02-19 NOTE — Procedures (Signed)
[name], FORERO NO.:  [id number]   MEDICAL RECORD NO.:  [id number]          PATIENT TYPE:  OUT   LOCATION:  RAD                           FACILITY:  [facility]   PHYSICIAN:  Pricilla Riffle, MD, FACCDATE OF BIRTH:  1926/05/08   DATE OF PROCEDURE:  07/07/2006  DATE OF DISCHARGE:                                  ECHOCARDIOGRAM   REFERRING PHYSICIAN:  Dr. Lodema Hong.   TEST INDICATION:  The patient is a 77 year old male with a history of  hypertension, MVP, murmur. Test to evaluate LV function.   Two-D echo with echo Doppler:  Left ventricle is normal in size with an end-  diastolic dimension of 43 mm. The interventricular septum is markedly  thickened at the base at 20 mm. It is still thickened in mid region but less  significant. Posterior wall is thickened at 12 mm.   Left atrium is mildly dilated at 41 mm. Aortic root is mildly dilated at 41  mm. RV and RA are normal.   The aortic valve is mildly thickened and calcified. There is mild  insufficiency, 1/4.   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   Pulmonic valve is grossly normal with mild insufficiency. Tricuspid valve is  normal with mild insufficiency.   Overall systolic function appears to be mildly reduced at approximately 45%  with hypokinesis of the inferior septum and akinesis of the inferior wall  base-mid. There is also hypokinesis of the distal posterior wall. Note this  is a change from the report in 2003 when LV function was normal.   RV systolic function is normal.   No pericardial effusion is seen.           ______________________________  Pricilla Riffle, MD, [facility]     PVR/MEDQ  D:  07/07/2006  T:  07/08/2006  Job:  [id number]

## 2011-02-19 NOTE — Op Note (Signed)
[facility]  Patient:    [name], [name]                           MRN: [id number] Proc. Date: 06/07/00 Adm. Date:  [id number] Attending:  Londell Moh                           Operative Report  SERVICE:  Urology.  PREOPERATIVE DIAGNOSES:  Urgency, frequency.  POSTOPERATIVE DIAGNOSES:  Urgency, frequency.  PROCEDURE:  First stage interstem implant.  SURGEON:  Dr. Logan Bores.  ANESTHESIA:  Local with sedation.  COMPLICATIONS:  None.  DRAINS:  A 16 French Foley catheter.  BRIEF HISTORY:  This 77 year old has severe urgency, frequency voiding as many 30 to 40 times a night. This is felt to be due to interstitial cystitis with obvious evidence of sensory urgency. The patient has failed all forms of anticholinergics and has also not responded to medications that are designed to treat interstitial cystitis including intravesical therapy and Elmiron. The patient is interested in being tested for an interstem implantation to try and control his frequency. He realizes that while this might help his pain, it is solely being inserted to try and control frequency and incontinence. It is not being used as the pain control modality. He gave full and informed consent.  DESCRIPTION OF PROCEDURE:  After adequate IV sedation, the patient was placed in the prone position. The buttocks were taped so that the rectum was exposed and the sphincter muscle could be identified. He had a 10 minute prep and scrub. He had been given preoperative antibiotics as well as intravenous antibiotics. He was then draped out. The fluoroscope was used to determine the S3 foramen and appropriate levels were marked out bilaterally. A needle was passed into the S3 foramen on each side as well as on the S4. The S3 foramen gave an excellent bellows effect bilaterally but the patient felt that he had better stimulation on the right than on the left. Using additional local anesthesia, the  incision was made below the needle and carried down through the fascia until the needle was seen to enter into the foramen. An Angiocath was then prepared by slicing down the side and turning this into a peel-away sheath. This was passed down into the foramen adjacent to the needle. The lead was then passed down through the Angiocath which was removed. This was tested and on leads 0 and 1, an excellent bellows effect was obtained even at the lowest stimulation levels. A less effect was obtained on level 2 but very little on level 3. The device was secured with silk sutures and was checked once again and the same effect was obtained indicating that the lead had not moved. A small incision was made in the left buttocks cheek and a pocket was made. The wire was passed through the passing system into the second incision. A small puncture hole was then made to bring the wire out to the outside. The connections were made in the usual fashion. The booty was used to cover the connections and was tied down with silk sutures. Both the incisions were irrigated and were closed with 2-0 Vicryl in layers and then surgical clips. A dressing of gauze and Tegaderm was applied. The patient tolerated the procedure well and was taken to the recovery room in good condition to be sent home with a prescription for Tylox as well  as Keflex and return to see me in follow-up in 1 weeks time. At that point, we can begin to assess whether the patient has a good effect and a permanent implant can then be placed. DD:  06/07/00 TD:  06/07/00 Job: [id number]

## 2011-02-19 NOTE — Op Note (Signed)
NAMEJERRIAN, [name] NO.:  [id number]   MEDICAL RECORD NO.:  [id number]                   PATIENT TYPE:  AMB   LOCATION:  NESC                                 [facility]ACILITY:  [facility]   PHYSICIAN:  Jamison Neighbor, M.D.               DATE OF BIRTH:  Feb 15, 1926   DATE OF PROCEDURE:  05/14/2003  DATE OF DISCHARGE:                                 OPERATIVE REPORT   PREOPERATIVE DIAGNOSES:  1. Malfunctioning InterStim.  2. Interstitial cystitis.   POSTOPERATIVE DIAGNOSES:  1. Malfunctioning InterStim.  2. Interstitial cystitis.   PROCEDURES:  1. Replace second stage InterStim.  2. Cystoscopy.  3. Hydrodistention of the bladder.   SURGEON:  Jamison Neighbor, M.D.   ANESTHESIA:  General.   COMPLICATIONS:  None.   DRAINS:  None.   BRIEF HISTORY:  This 77 year old male had an InterStim placed because of  poor control of urinary urgency and frequency.  This worked very nicely for  several years but recently has not worked well for the patient at all.  The  patient is undergoing reprogramming and was found to have a low battery.  The patient has requested that the battery be exchanged.  He has also asked  that at the same time, he undergo a repeat hydrodistention which has  generally helped his chronic pelvic pain.  He also requested exam under  anesthesia.  The patient understands the risks and benefits of the  procedure.  He knows that we will reprogram his InterStim after the  procedure.  He also knows that there will be increased urgency and frequency  following the hydrodistention, and this may make it difficult at first to  tell whether the InterStim is working well for him or not.  He gave full and  informed consent.   DESCRIPTION OF PROCEDURE:  After successful induction of general anesthesia,  the patient was placed in the prone position.  He received a full 10 minute  prep with both Betadine scrub and paint and then was draped including a  Vi-  Drape.  The previous incision was opened.  The subcutaneous tissues were  opened with electrocautery, and the InterStim was elevated.  The wires were  all dissected free from the surrounding tissue.  The protective booty  covering the connection was cut away, and the screwdriver was used to  connect a quadripolar lead from the lead extender.  A new lead extender was  placed into a new IPG and tightened down appropriately, and this was  connected to the previously placed quadripolar lead in the usual fashion.  The device was placed back into the pocket with the writing side up.  All  wires were felt to be appropriately positioned.  The area was very carefully  irrigated with antibiotic solution.  Prior to closure, an impedance test was  done, and all four leads appeared to  work normally.  The incision was closed  with a running suture of 2-0 Vicryl and then with surgical staples.  A  Tegaderm-type dressing was applied.  The patient was then rolled onto  another bed and placed into the dorsal lithotomy position.  He was re-  prepped and draped.  Cystoscopy was performed, and the urethra was  visualized in its entirety and was found to be normal.  Beyond the  verumontanum, prostate was not enlarged, and there was a wide open bladder  neck.  The bladder itself was carefully inspected.  It was free of any tumor  or stones.  Both ureteral orifices were normal in configuration and  location.  The bladder was distended at a pressure of 100 cm of water for  five minutes.  When the bladder was drained, glomerulations could be seen  throughout the bladder.  Bladder capacity was reasonable at 700 mL.  No pure  ulcers could be seen.  The patient did not require a biopsy.  The bladder  was drained.  A B&O suppository was inserted.  The patient tolerated the  procedure well and was taken to the recovery room in good condition.                                               Jamison Neighbor,  M.D.    RJE/MEDQ  D:  05/14/2003  T:  05/14/2003  Job:  [id number]   cc:   Dr. Loleta Chance

## 2011-02-19 NOTE — Op Note (Signed)
[name], [name] NO.:  [id number]   MEDICAL RECORD NO.:  [id number]          PATIENT TYPE:  AMB   LOCATION:  NESC                         FACILITY:  [facility]   PHYSICIAN:  Jamison Neighbor, M.D.  DATE OF BIRTH:  January 29, 1926   DATE OF PROCEDURE:  06/10/2006  DATE OF DISCHARGE:                                 OPERATIVE REPORT   PREOPERATIVE DIAGNOSIS:  Urgency and incontinence.   POSTOPERATIVE DIAGNOSIS:  Urgency and incontinence.   PROCEDURE:  Second stage InterStim implantation.   SURGEON:  Jamison Neighbor, M.D.   ANESTHESIA:  IV sedation plus local infiltration.   COMPLICATIONS:  None.   DRAINS:  None.   BRIEF HISTORY:  This 77 year old male previously had an InterStim placed but  did not feel that it worked that well for him.  This was one of the original  IPG1 stimulators and he did find that the large generator was very painful  for him and he eventually requested that it be removed.  Over the next few  months, he began to realize that he was getting much better improvement in  his urgency and frequency than he had thought and he requested that a new  InterStim be placed.  The patient did go through first state tests.  He had  an excellent response with a decrease in his frequency and urgency down to  just 7-8 times per day, which is in essence a normal voiding pattern.  The  patient is now to undergo second stage InterStim implantation.  He  understands the risks and benefits of the procedure and gave full informed  consent.   PROCEDURE:  After successful induction of adequate IV sedation, the patient  was placed in the prone position.  He was given a full 10-minute scrub and  pain with Betadine and was then prepped with a Vi-Drape.  The external lead  was cut off at the skin level and held in place with a mosquito clamp.  The  patient had local infiltration over the site of the previous connection and  incision was made.  The connection from the  previously placed quadripolar  lead to the external lead was identified and elevated.  The protective  bootie was cut away and the external lead was disconnected and discarded.  The connection was then made to the new IPG1.  The pocket was created and  the entire pocket was then irrigated with antibiotic solution.  The IPG2 was  then placed within the pocket with the writing side up.  Impedance testing  showed that everything was working perfectly.  The incision was then closed  with 2-0 Vicryl and surgical staples.  The patient tolerated the procedure  well, was taken to the recovery room in good condition.          ______________________________  Jamison Neighbor, M.D.  Electronically Signed    RJE/MEDQ  D:  06/10/2006  T:  06/10/2006  Job:  [id number]

## 2011-02-19 NOTE — Consult Note (Signed)
[name], [name] NO.:  [id number]   MEDICAL RECORD NO.:  [id number]                   PATIENT TYPE:  INP   LOCATION:  3001                                 FACILITY:  [facility]   PHYSICIAN:  Jamison Neighbor, M.D.               DATE OF BIRTH:  May 07, 1926   DATE OF CONSULTATION:  02/22/2003  DATE OF DISCHARGE:                                   CONSULTATION   CONSULTING PHYSICIANS:  Dr. Sandrea Hughs and Dr. Marcelyn Bruins.   REASON FOR CONSULTATION:  1. Urosepsis.  2. Enterocele cystitis.  3. Chronic pelvic pain.  4. History of inter stent implant.   HISTORY OF PRESENT ILLNESS:  This 77 year old male has had longstanding  problems with lower urinary tract urgency, frequency and pain, and is felt  to have interstitial cystitis. The patient has been treated with multiple  medications including Elmiron, Elavil, Flomax, Urocit, etc., without much in  the way of improvement. The patient has had an inter stent implant placed  because of the intractable nature of his frequency. It is difficult to get a  straight answer from him in terms of determining if this works for him. At  times he says he would like to have it removed because it feels bulky in his  lower back and bothers him, and at other times he feels that it does cut  down on his frequency.   The patient presented to the hospital on Feb 18, 2003, with severe sepsis  secondary to urinary tract infection. He was admitted and placed on Xigris  along with vancomycin and Maxipime. The patient has slowly responded and his  temperature has dropped down nicely. He has been less confused and he has  slowly responded. He now, however, is at the point where he is bothered by  bladder pain. He does have a Foley catheter in place. He has requested that  urologic evaluation be obtained in order to determine if there is anything  else that needs to be done from the standpoint of his symptoms.   We note that the  patient now has a normal white count of 4.9, hematocrit is  33.2. His basic metabolic panel is normal. His BUN is 17, creatinine 1.1. As  part of his evaluation a renal ultrasound was obtained and the results are  not available on the chart.   The patient primarily complains of burning with the catheter which he feels  will respond to Urocit. We also had some discussion about whether he does or  does not have pain from the inter stent implant. I certainly told him that  if he is not sure if it is working it can be turned off and left off for  several weeks to see if it changes his frequency and urgency. If it does not  certainly it would be reasonable to remove.  PAST MEDICAL HISTORY:  1. COPD.  2. Hypertension.   ALLERGIES:  No known allergies.   MEDICATIONS:  From a bladder standpoint he is taking Ultram, Flomax and  Elavil.   FAMILY HISTORY:  Noncontributory.   SOCIAL HISTORY:  Noncontributory.   REVIEW OF SYSTEMS:  Noncontributory.   PHYSICAL EXAMINATION:  GENERAL:  He is a well developed, well nourished  male. He is at  his baseline  in terms of coherence and confusion. We note  that compared to previous hospital stays, thrombocytopenia and confusion  seem to be getting better  and sepsis is much improved. On examination  there were no flank masses or tenderness.  ABDOMEN:  The abdomen is soft, nontender with no apparent masses, rebound or  guarding.  GENITOURINARY: The interstent site was carefully inspected. I could see no  evidence whatsoever of infection, inflammation or hematoma formation. It is  completely inert and is in essence unchanged since the time it was place.  The GU examination is normal.  RECTAL:  Examination not performed.   IMPRESSION:  I agree with the change from Maxipime to Cipro. If the patient  remains afebrile I certainly think he can be discharged to home in the next  few days on antibiotic therapy. It should be noted that when the catheter   comes out it is likely that he will have more pain and he will require  Flomax. I have started him back on Urocit 1 to 2 p.o. q.6h. p.r.n. burning  and plan to see him back in the office in 1 to 2 weeks. If his urine remains  clear at that point, I might consider him for anesthetic __________  instillations into the bladder which would consist of 40,000 units of  heparin, Lidocaine 10 cc 2% and 5 cc of bicarbonate. This may be a new  option to eliminate some of his bladder pain. Long-term I will discuss with  him the benefit of programming and/or removing his inter stent. I will try  to get him to make a decision as to how he would like to proceed from that  standpoint.                                                Jamison Neighbor, M.D.    RJE/MEDQ  D:  02/22/2003  T:  02/23/2003  Job:  [id number]

## 2011-02-19 NOTE — Op Note (Signed)
[facility]  Patient:    [name], [name]                           MRN: [id number] Proc. Date: 06/21/00 Adm. Date:  [id number] Attending:  Londell Moh                           Operative Report  PREOPERATIVE DIAGNOSIS:  Urgency and frequency syndrome.  POSTOPERATIVE DIAGNOSIS:  Urgency and frequency syndrome.  PROCEDURE:  Second stage _______ implant.  SURGEON:  Jamison Neighbor, M.D.  ANESTHESIA:  General.  COMPLICATIONS:  None.  DRAINS:  None.  BRIEF HISTORY:  This is a 77 year old male who has had longstanding problems with urgency and frequency secondary to interstitial cystitis.  The patient did not respond to intravesical therapy, Elmiron, or anticholinergics.  He was tested with a first stage implant, and had a very nice response in terms of decreased urgency and frequency, and resolution of much of his pain.  The patient caught the wire of the first stage test stimulator, and avulsed the wire close to the level of the skin.  This was cut off and the patient elected to undergo the second stage implant.  The patient had a return of his symptoms once the device had been turned off, and realized that he will not have improvement in his symptoms until the device can be reactivated.  The patient understands the risks and benefits of the procedure, and gave full and informed consent.  DESCRIPTION OF PROCEDURE:  After successful induction of general anesthesia, the patient was placed in the prone position.  He had a full 10 minute scrub and prep.  He was then draped including a Vidrape.  The patient had received Ancef and gentamicin preoperatively.  A C-arm was used to check the position of the lead which appeared to have not moved in any way, and did not appear to have been injured by the patients misadventure with the external stimulator. An incision was made directly over the spot where the connections were made from the external stimulator  to the original lead.  The connective ______ was removed, and the old external lead was unscrewed and discarded.  The previously placed lead was then tested and there was an excellent bellows effect on position 0, 1, and 2, exactly as was seen in the operating room, indicating that this had not moved in any way.  The new ______ was placed and the connection to the IPJ lead extension was made in the usual fashion.  The protective covering was then tied down with a series of silk sutures.  The pocket for this generator was then made and this was placed in with the writing side up.  The entire area was carefully irrigated and this was then closed with 2-0 Vicryl and staples.  The patient had a coverlet applied.  He tolerated the procedure well and was taken to the recovery room in good condition.  The patient still has antibiotics and pain medication.  He will finish those.  He was advised not to let that area get wet, and when he comes in in one week, he will be turned on to start his new stimulation.  The staples will be taken out at that time. DD:  06/21/00 TD:  06/22/00 Job: [id number]

## 2011-02-19 NOTE — Op Note (Signed)
NAMEHOA, DERISO NO.:  [id number]   MEDICAL RECORD NO.:  [id number]          PATIENT TYPE:  AMB   LOCATION:  [facility]                         FACILITY:  [facility]   PHYSICIAN:  Jamison Neighbor, M.D.  DATE OF BIRTH:  03/14/1926   DATE OF PROCEDURE:  05/27/2006  DATE OF DISCHARGE:                                 OPERATIVE REPORT   PREOPERATIVE DIAGNOSIS:  Urgency-frequency syndrome.   POSTOPERATIVE DIAGNOSIS:  Urgency-frequency syndrome.   PROCEDURE:  First stage InterStim implantation.   SURGEON:  Jamison Neighbor, M.D.   ANESTHESIA:  Local plus IV sedation.   COMPLICATIONS:  None.   DRAINS:  None.   BRIEF HISTORY:  This 77 year old male has had longstanding problems with  urgency and frequency.  The patient was initially treated for chronic  prostatitis but then we began to treat him as an urgency-frequency syndrome  patient.  The patient had InterStim placed and at first did extremely well  but because of the size of the generator, he had terrible problem with  localized pain.  The patient felt that he could not tolerate this large  generator and eventually had it removed.   Now that the new IPG II is available which is much smaller, he has requested  that this be returned.  He says that the back pain is actually a little bit  better but there is also some evidence that it may not have been caused  solely by the InterStim.  The patient feels that his frequency has gotten  worse despite the use of multiple medications.  He has tried numerous  anticholinergics without much improvement.  He has used urinary analgesics  as well as Elmiron and Elavil and at this point would like to try a new  InterStim.  The patient does understand that if he does not respond to the  first stage, we will not place a second stage and should he have problems at  this point, we will not be considering him for additional neurostimulation  therapy as this would be a second attempt  to try to get this to work well  for him.  The patient gave full informed consent.   PROCEDURE:  After successful induction of IV sedation, the patient was  placed in the prone position.  The buttocks were pulled apart with tape in  order to expose the sphincter.  He was then given a full 10-mintue scrub  with paint and Betadine scrub and was then covered with a Vi-Drape.  The  patient received preoperative antibiotics by mouth as well as Ancef and  gentamicin.  The fluoroscopy unit was used to localize the level of the  third sacral foramen.  A needle was passed into what was thought to be the  third foramen off the left-hand side.  Fluoroscopy showed this was a little  low.  Stimulation showed an excellent bellows effect but no effect on the  great toe so one level higher was then tested.  At that level, there was  good dorsiflexion of the great toe but not much  in the way of a bellows  effect.  A needle was then placed on the right-hand side at that same level.  This needle had a nice medial to lateral bend to it and with stimulation  there was an excellent bellows effect as well as good sensory stimulation of  the great toe.  Since this right-sided needle had both a good bellows effect  and the dorsiflexion of the great toe, it was felt to be optimally placed.  The guide wire was passed down through the needle which was removed.  A  small incision was made.  The dilating trocar was then inserted.  The  quadripolar lead was then passed down through the trocar and appropriately  placed.  Good stimulation was seen on I and II with a bellows effect and  dorsiflexion of the great toe and it was felt this was an optimal position.  An incision was made in the upper aspect of the right buttock and a small  pocket was made.  The quadripolar lead was tunneled over to that area.  The  lead extender was then brought out over to the left-hand side and a  connection was made between the lead  extender and the quadripolar lead.  The  protected bootie was tied down with silk sutures.  The pocket was created  and this connector was then placed down within the pocket.  The incision was  closed with a 2-0 Vicryl plus a 3-0 nylon.  The paramedian incision was  closed with 3-0 nylon.  The patient tolerated the procedure and was taken to  the recovery room in good condition.  He has adequate pain medication.  He  will be given Keflex and return to see me in one week.  He will be asked to  do a 3-day voiding diary so we can assess the improvement from his baseline.           ______________________________  Jamison Neighbor, M.D.  Electronically Signed     RJE/MEDQ  D:  05/27/2006  T:  05/27/2006  Job:  [id number]

## 2011-02-19 NOTE — Op Note (Signed)
NAMETRIP, CAVANAGH NO.:  [id number]   MEDICAL RECORD NO.:  [id number]                   PATIENT TYPE:  AMB   LOCATION:  [facility]                                 FACILITY:  [facility]   PHYSICIAN:  Jamison Neighbor, M.D.               DATE OF BIRTH:  1926/09/04   DATE OF PROCEDURE:  04/14/2004  DATE OF DISCHARGE:                                 OPERATIVE REPORT   SERVICE:  Urology.   PREOPERATIVE DIAGNOSES:  1. Interstitial cystitis.  2. Undesired Interstim neural stimulator.   POSTOPERATIVE DIAGNOSES:  1. Interstitial cystitis.  2. Undesired Interstim neural stimulator.   PROCEDURE:  1. Removal of first and second stage Interstim implant.  2. Cystoscopy and hydrodistention of the bladder with Marcaine and Pyridium     installation.   SURGEON:  Jamison Neighbor, M.D.   ANESTHESIA:  General.   COMPLICATIONS:  None.   DRAINS:  None.   BRIEF HISTORY:  This 77 year old male has had a long standing history of  lower urinary tract symptoms.  He was originally felt to have chronic  prostatitis but eventually a diagnosis of interstitial cystitis was made.   The patient has been managed with standard therapy as well as  hydrodistention. Because he had uncontrolled urgency and frequency, he was  given an Interstim implant. The patient felt that this was not working as  well as he would like. We subsequently changed the battery and reprogrammed  it several times. It should be noted that the Interstim has been in for many  years and for a long time, the patient was quite pleased with these findings  and decided that he does not wish to have this in any longer and cannot be  convinced to have this reprogrammed and/or revised. The patient has been  advised that it is unlikely that taking out the Interstim will change some  of his chronic back pain in any way but he cannot be dissuaded from the idea  that he wished to have this device removed. He is aware of  the fact that  there may be a significant deterioration in his urinary frequency but we  will be unwilling to replace the implant if he should change his mind.  The  patient understands the risks and benefits of the procedure and gave full  consent. He has also requested that a cystoscopy and hydrodistention be  performed.   DESCRIPTION OF PROCEDURE:  After successful induction of general anesthesia,  the patient was placed in the prone position, prepped with Betadine and  draped in the usual sterile fashion.  An incision made directly over the  previously placed pulse generator, electrocautery was used to dissect the  generator and the lead away from the tissue. The device was elevated and the  quadripolar lead was cut. A second incision was made directly over the  original implant site,  electrocautery was used to dissect down to the  fascial layer where the sutures attaching the lead to the periosteum could  be identified. These sutures were cut and the lead was removed. This was the  original nonadjustable lead with the three wings that has not been used in  many years. The two incisions were irrigated, they were closed with 2-0  Vicryl and surgical staples. After dressings were applied, the patient was  flipped over onto a gurney and then flipped back into the dorsal lithotomy  position. He was then reprepped and draped in the usual sterile fashion.  Cystoscopy was performed, the urethra was visualized and was found to be  normal. Beyond the verumontanum, the patient had a wide open prostatic fossa  having undergone successful TURP. There was at best minimal regrowth in the  lateral lobes but absolutely no obstruction.  The bladder itself was free of  tumor or stones, both ureteral orifices were normal in configuration and  location.  No trabeculation could be seen. The bladder was distended at a  pressure of 100 cm of water for five minutes and when the bladder was  drained,  glomerulations could be seen throughout the bladder. There was  significant bleeding from the bladder consistent with a very irritated  appearance of the bladder. The patient did not require repeat biopsy as he  has had these done in the past. The biopsy site could be identified. The  patient had a mixture or Marcaine and Pyridium left within the bladder,  Marcaine and Kenalog were injected periurethrally.  The patient tolerated  the procedure well and was taken to the recovery room in good condition.                                               Jamison Neighbor, M.D.    RJE/MEDQ  D:  04/14/2004  T:  04/14/2004  Job:  [id number]

## 2011-02-19 NOTE — Discharge Summary (Signed)
[name], SOLORIO NO.:  [id number]   MEDICAL RECORD NO.:  [id number]                   PATIENT TYPE:  INP   LOCATION:                                       FACILITY:  [facility]   PHYSICIAN:  Oley Balm. Sung Amabile, M.D. [facility]          DATE OF BIRTH:  Feb 11, 1926   DATE OF ADMISSION:  02/19/2003  DATE OF DISCHARGE:  02/25/2003                                 DISCHARGE SUMMARY   ADMISSION DIAGNOSES:  1. Severe sepsis.  2. Suspected urinary tract infection.  3. Leukopenia.  4. Thrombocytopenia.  5. Circulatory shock.  6. Hypokalemia.  7. Encephalopathy.   DISCHARGE DIAGNOSES:  1. Severe sepsis.  2. Urinary tract infection secondary to Escherichia coli.  3. Interstitial cystitis.  4. Leukopenia, resolved.  5. Thrombocytopenia secondary to DIC, resolved.  6. Shock resolved.  7. Encephalopathy secondary to sepsis, resolved.  8. Hypokalemia, resolved.  9. DICe.   HISTORY OF PRESENT ILLNESS:  Please refer to the admission History and  Physical for this patient's initial presentation.  Briefly, he is a 76-year-  old gentleman with a past history of interstitial cystitis followed by Dr.  Logan Bores.  He presented with mental status changes, hypotension, and the  laboratory abnormalities noted above.  His urinalysis was abnormal  suggestive of a urinary tract infection.  He initially presented to the  [facility] Emergency Department and was transferred to the  [facility] ICU for further evaluation and management where his  admission history and physical were performed by Dr. Sherene Sires.   HOSPITAL COURSE:  Dr. Sherene Sires started the patient on broad spectrum  antibiotics, Vancomycin plus cefepime.  Urine culture ultimately grew out  Ecoli and the Vancomycin was discontinued.  Due to the altered mental status  and DIC, he was judged to have severe sepsis and therefore was started on  Xigris.  Also of note, he had acute renal insufficiency with a  creatinine  that peaked at 1.8.  For management of severe agitative delirium he was  treated with Ativan and Haldol.  After three days in the intensive care unit  his sensorium had cleared dramatically.  He was transferred to the Step-Down  unit where the course of Xigris was completed.  On 02/22/03 he was  transferred to a regular floor and antibiotics were changed to  ciprofloxacin.  He was seen in consultation by Dr. Logan Bores, who agreed with  the current management and requested followup in 1-2 weeks after discharge.  The last two days of his hospitalization were uncomplicated.  At the time of  discharge the patient is back in his usual state of health with no new  complaints.   DISCHARGE MEDICATIONS:  1. Triamterine / HCTZ -- continue previous dose.  2. Ultram -- continue previous dose.  3. Flomax -- continue previous dose.  4. Elavil -- continue previous dose.  5. Ciprofloxacin  500 mg p.o. q.12h. x3 more days then stop.   DISCHARGE INSTRUCTIONS:  1. He is to contact Dr. Logan Bores office to arrange for followup in 1-2 weeks.  2. He is to contact his primary care physician, Dr. Mirna Mires in     [place], to arrange appropriate followup.  3. No followup is necessary with the pulmonary critical care medicine     division.                                               Oley Balm Sung Amabile, M.D. [facility]    DBS/MEDQ  D:  02/25/2003  T:  02/25/2003  Job:  [id number]   cc:   Jamison Neighbor, M.D.  [address]  [place]  [place] [address]  Fax: [phone]   Annia Friendly. Loleta Chance, M.D.  [address]  [place]  [place] [address]  Fax: [phone]

## 2011-02-22 ENCOUNTER — Inpatient Hospital Stay (HOSPITAL_COMMUNITY)
Admission: EM | Admit: 2011-02-22 | Discharge: 2011-02-24 | DRG: 605 | Disposition: A | Discharge status: Home / Self Care | Payer: Medicare Other | Attending: Internal Medicine | Admitting: Internal Medicine

## 2011-02-22 ENCOUNTER — Emergency Department (HOSPITAL_COMMUNITY): Payer: Medicare Other

## 2011-02-22 ENCOUNTER — Encounter: Payer: Self-pay | Admitting: Family Medicine

## 2011-02-22 ENCOUNTER — Telehealth: Payer: Self-pay | Admitting: Family Medicine

## 2011-02-22 ENCOUNTER — Ambulatory Visit (INDEPENDENT_AMBULATORY_CARE_PROVIDER_SITE_OTHER): Payer: Medicare Other | Admitting: Family Medicine

## 2011-02-22 DIAGNOSIS — E860 Dehydration: Secondary | ICD-10-CM | POA: Diagnosis present

## 2011-02-22 DIAGNOSIS — I5022 Chronic systolic (congestive) heart failure: Secondary | ICD-10-CM | POA: Diagnosis present

## 2011-02-22 DIAGNOSIS — M545 Low back pain, unspecified: Secondary | ICD-10-CM | POA: Diagnosis present

## 2011-02-22 DIAGNOSIS — E876 Hypokalemia: Secondary | ICD-10-CM | POA: Diagnosis not present

## 2011-02-22 DIAGNOSIS — D62 Acute posthemorrhagic anemia: Secondary | ICD-10-CM | POA: Diagnosis present

## 2011-02-22 DIAGNOSIS — L97409 Non-pressure chronic ulcer of unspecified heel and midfoot with unspecified severity: Secondary | ICD-10-CM | POA: Diagnosis present

## 2011-02-22 DIAGNOSIS — D61818 Other pancytopenia: Secondary | ICD-10-CM | POA: Diagnosis present

## 2011-02-22 DIAGNOSIS — G894 Chronic pain syndrome: Secondary | ICD-10-CM | POA: Diagnosis present

## 2011-02-22 DIAGNOSIS — M109 Gout, unspecified: Secondary | ICD-10-CM | POA: Diagnosis present

## 2011-02-22 DIAGNOSIS — S20229A Contusion of unspecified back wall of thorax, initial encounter: Principal | ICD-10-CM | POA: Diagnosis present

## 2011-02-22 DIAGNOSIS — I509 Heart failure, unspecified: Secondary | ICD-10-CM | POA: Diagnosis present

## 2011-02-22 DIAGNOSIS — F0390 Unspecified dementia without behavioral disturbance: Secondary | ICD-10-CM | POA: Diagnosis present

## 2011-02-22 DIAGNOSIS — M549 Dorsalgia, unspecified: Secondary | ICD-10-CM

## 2011-02-22 DIAGNOSIS — K5900 Constipation, unspecified: Secondary | ICD-10-CM | POA: Diagnosis present

## 2011-02-22 DIAGNOSIS — I951 Orthostatic hypotension: Secondary | ICD-10-CM | POA: Diagnosis present

## 2011-02-22 LAB — CBC
Platelets: 85 10*3/uL — ABNORMAL LOW (ref 150–400)
RBC: 2.68 MIL/uL — ABNORMAL LOW (ref 4.22–5.81)
WBC: 2.3 10*3/uL — ABNORMAL LOW (ref 4.0–10.5)

## 2011-02-22 LAB — BASIC METABOLIC PANEL
BUN: 33 mg/dL — ABNORMAL HIGH (ref 6–23)
Creatinine, Ser: 1.58 mg/dL — ABNORMAL HIGH (ref 0.4–1.5)
GFR calc non Af Amer: 42 mL/min — ABNORMAL LOW (ref >60)
Potassium: 3.6 mEq/L (ref 3.5–5.1)

## 2011-02-22 LAB — DIFFERENTIAL
Basophils Absolute: 0 10*3/uL (ref 0.0–0.1)
Basophils Relative: 0 % (ref 0–1)
Eosinophils Absolute: 0 10*3/uL (ref 0.0–0.7)
Lymphs Abs: 0.4 10*3/uL — ABNORMAL LOW (ref 0.7–4.0)
Neutrophils Relative %: 69 % (ref 43–77)

## 2011-02-22 MED ORDER — IOHEXOL 300 MG/ML  SOLN
100.0000 mL | Freq: Once | INTRAMUSCULAR | Status: AC | PRN
  Administered 2011-02-22: 100 mL via INTRAVENOUS
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
## 2011-02-22 NOTE — Telephone Encounter (Signed)
Patient to be seen in office today

## 2011-02-22 NOTE — Patient Instructions (Signed)
You need to go to the ED for further evaluation. I will call the doctor to let him know that you are on the way  Use your laxative tablet at home once you get back home to help your bowels  .  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  F/U as before

## 2011-02-23 ENCOUNTER — Encounter: Payer: Medicare Other | Admitting: Internal Medicine

## 2011-02-23 LAB — HEPATIC FUNCTION PANEL
AST: 23 U/L (ref 0–37)
Albumin: 3 g/dL — ABNORMAL LOW (ref 3.5–5.2)
Total Protein: 5.9 g/dL — ABNORMAL LOW (ref 6.0–8.3)

## 2011-02-23 LAB — DIFFERENTIAL
Basophils Absolute: 0 10*3/uL (ref 0.0–0.1)
Basophils Absolute: 0 10*3/uL (ref 0.0–0.1)
Basophils Relative: 1 % (ref 0–1)
Eosinophils Absolute: 0.1 10*3/uL (ref 0.0–0.7)
Eosinophils Absolute: 0.1 10*3/uL (ref 0.0–0.7)
Eosinophils Relative: 5 % (ref 0–5)
Eosinophils Relative: 4 % (ref 0–5)
Lymphocytes Relative: 16 % (ref 12–46)
Lymphs Abs: 0.3 10*3/uL — ABNORMAL LOW (ref 0.7–4.0)
Monocytes Absolute: 0.2 10*3/uL (ref 0.1–1.0)
Monocytes Absolute: 0.2 10*3/uL (ref 0.1–1.0)

## 2011-02-23 LAB — CBC
HCT: 25.8 % — ABNORMAL LOW (ref 39.0–52.0)
MCHC: 33.8 g/dL (ref 30.0–36.0)
MCHC: 34.5 g/dL (ref 30.0–36.0)
MCV: 88.9 fL (ref 78.0–100.0)
Platelets: 63 10*3/uL — ABNORMAL LOW (ref 150–400)
Platelets: 72 10*3/uL — ABNORMAL LOW (ref 150–400)
RDW: 13.8 % (ref 11.5–15.5)
RDW: 13.9 % (ref 11.5–15.5)
WBC: 1.6 10*3/uL — ABNORMAL LOW (ref 4.0–10.5)
WBC: 2.1 10*3/uL — ABNORMAL LOW (ref 4.0–10.5)

## 2011-02-23 LAB — BASIC METABOLIC PANEL
BUN: 27 mg/dL — ABNORMAL HIGH (ref 6–23)
CO2: 33 mEq/L — ABNORMAL HIGH (ref 19–32)
Calcium: 8.7 mg/dL (ref 8.4–10.5)
GFR calc non Af Amer: 56 mL/min — ABNORMAL LOW (ref >60)
Glucose, Bld: 135 mg/dL — ABNORMAL HIGH (ref 70–99)

## 2011-02-23 LAB — CK: Total CK: 597 U/L — ABNORMAL HIGH (ref 7–232)

## 2011-02-23 LAB — PROTIME-INR: Prothrombin Time: 14.7 seconds (ref 11.6–15.2)

## 2011-02-23 LAB — FOLATE: Folate: 6 ng/mL

## 2011-02-23 LAB — IRON AND TIBC: UIBC: 306 ug/dL

## 2011-02-23 LAB — MAGNESIUM: Magnesium: 2.1 mg/dL (ref 1.5–2.5)

## 2011-02-23 LAB — FERRITIN: Ferritin: 38 ng/mL (ref 22–322)

## 2011-02-23 LAB — VITAMIN B12: Vitamin B-12: 1301 pg/mL — ABNORMAL HIGH (ref 211–911)

## 2011-02-24 LAB — DIFFERENTIAL
Basophils Absolute: 0 10*3/uL (ref 0.0–0.1)
Basophils Relative: 0 % (ref 0–1)
Eosinophils Absolute: 0 10*3/uL (ref 0.0–0.7)
Eosinophils Relative: 1 % (ref 0–5)
Lymphs Abs: 0.4 10*3/uL — ABNORMAL LOW (ref 0.7–4.0)

## 2011-02-24 LAB — CROSSMATCH
ABO/RH(D): B POS
Antibody Screen: NEGATIVE
Unit division: 0

## 2011-02-24 LAB — CBC
MCV: 87.4 fL (ref 78.0–100.0)
Platelets: 75 10*3/uL — ABNORMAL LOW (ref 150–400)
RDW: 13.7 % (ref 11.5–15.5)
WBC: 3 10*3/uL — ABNORMAL LOW (ref 4.0–10.5)

## 2011-02-24 LAB — COMPREHENSIVE METABOLIC PANEL
Albumin: 3.2 g/dL — ABNORMAL LOW (ref 3.5–5.2)
BUN: 19 mg/dL (ref 6–23)
Chloride: 103 mEq/L (ref 96–112)
Creatinine, Ser: 0.91 mg/dL (ref 0.4–1.5)
GFR calc non Af Amer: >60 mL/min (ref >60)
Total Bilirubin: 1.1 mg/dL (ref 0.3–1.2)

## 2011-02-24 LAB — PROTIME-INR: Prothrombin Time: 15.8 seconds — ABNORMAL HIGH (ref 11.6–15.2)

## 2011-02-24 LAB — APTT: aPTT: 35 seconds (ref 24–37)

## 2011-02-24 LAB — TSH: TSH: 2.387 u[IU]/mL (ref 0.350–4.500)
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
## 2011-02-25 NOTE — Discharge Summary (Signed)
[name], [name]                  ACCOUNT NO.:  [id number]  MEDICAL RECORD NO.:  [id number]           PATIENT TYPE:  I  LOCATION:  A211                          FACILITY:  [facility]  PHYSICIAN:  Elliot Cousin, M.D.    DATE OF BIRTH:  1925/11/17  DATE OF ADMISSION:  02/22/2011 DATE OF DISCHARGE:  05/23/2012LH                              DISCHARGE SUMMARY   DISCHARGE DIAGNOSES: 1. Left flank pain secondary to large subcutaneous hematoma of the     left lower lumbar region extending to the superior margin of the     left gluteal muscles measuring 11.2 x 2.7 x 8.6 following a fall     from his tractor. 2. Acute blood loss anemia.  The patient's hemoglobin on admission was     8.2.  It fell to a nadir of 6.8.  He was transfused 2 units of     packed red blood cells.  His hemoglobin was 9.4 prior to discharge. 3. Anemia panel prior to transfusion revealed a total iron of 19, TIBC     of 325, percent saturation of 6, vitamin B12 of 1301, folate of 6,     and ferritin of 38. 4. Pancytopenia, which apparently has been chronic.  He was previously     followed by Dr. Mariel Sleet and is now being followed by the Cancer     Center at [facility].  His white blood cell count was 3 and     his platelet count was 75 prior to discharge. 5. Hypokalemia, secondary to IV fluids.  Repleted. 6. Mild orthostatic hypotension. 7. Melanoma of the left heel, status post excision in January 2012.     The patient has a residual left heel wound that is being dressed     daily by his wife as recommended by the wound care nurse. 8. History of heart block, status post biventricular pacemaker     insertion in March 2010 by Dr. Lewayne Bunting. 9. Class III chronic systolic congestive heart failure. 10.Moderate mitral valve regurgitation. 11.Hypertension. 12.History of urinary urgency and incontinence; status post     bladder stimulator.  The patient is followed by urologist, Dr.     Alfredo Martinez. 13.Chronic pain  syndrome/chronic low back pain, treated with     transdermal fentanyl. 14.Chronic constipation. 15.Gout. 16.Mild dementia.  DISCHARGE MEDICATIONS: 1. Acetaminophen 325 mg 1-2 tablets every 6 hours as needed for pain. 2. MiraLax laxative 17 grams in water or other beverage twice daily     for constipation. 3. Maxzide 37.5/25 mg.  The dose was decreased to half a tablet daily. 4. Allopurinol 300 mg daily. 5. Amitriptyline 25 mg at bedtime. 6. Exelon patch 9.5 mg per 24 hours, 1 patch transdermally daily. 7. Fentanyl patch 50 mcg per hour 1 patch every 3 days. 8. Flomax 0.4 mg daily. 9. Omeprazole 20 mg daily. 10.Uretron DS 1 tablet 3 times daily. 11.Stop nabumetone.  DISCHARGE DISPOSITION:  The patient was discharged to home in improved and stable condition on Feb 24, 2011.  He will follow up with Dr. Lodema Hong on Mar 03, 2011 at 1 o'clock  p.m.  CONSULTATIONS:  None.  PROCEDURES PERFORMED:  CT scan of the abdomen and pelvis on Feb 22, 2011.  The results revealed increased stool throughout the colon, large subcutaneous hematoma of the left lower lumbar region extending to the superior margin of the left gluteal muscles measuring 11.2 cm x 2.7 cm x 8.6 cm, additional scattered overlying subcutaneous edema/contusion at the left flank, prior laminectomies at L4-L5, multilevel degenerative disk and facet disease changes, and no acute fractures.  HISTORY OF PRESENTING ILLNESS:  The patient is an 77 year old man with a past medical history significant for pacemaker insertion for heart block, left heel excision of a malignant melanoma in January 2012, chronic pancytopenia, and chronic low back pain.  He presented to the emergency department on Feb 22, 2011 with a chief complaint of left flank pain.  The pain started after his tractor overturned and he fell off of it.  He sustained an injury to his left flank.  Approximately 24 hours later, the pain intensified and his family insisted  that he come to the emergency department for evaluation.  In the emergency department, an x-ray of his left hip was ordered.  It revealed no acute osseous abnormalities.  Subsequently, a CT of his abdomen and pelvis was ordered and it revealed a large left lower back/flank hematoma.  He was therefore admitted for further evaluation and management.   HOSPITAL COURSE: The patient was restarted on fentanyl and his other chronic medications with exception of Maxzide.  While he was being hydrated, Maxzide was being withheld.  In addition to the fentanyl patch, he was treated with as-needed intravenous morphine and/or oxycodone for breakthrough pain. IV fluids were started for volume repletion.  Due to relatively low normal blood pressures, he had to be bolused 500 mL of normal saline. His hemoglobin and hematocrit were followed closely.  On admission, his hemoglobin was 8.2 but it fell to a nadir of 6.8.  He was transfused 2 units of packed red blood cells.  His hemoglobin improved to 8.9 and then subsequently to 9.4 prior to discharge.  The patient was also noted to be pancytopenic which apparently is chronic.  The exact etiology and/or diagnosis of this pancytopenia was unclear during the hospitalization.  He may have a myelodysplastic syndrome.  He had been followed by Dr. Mariel Sleet in the past but now he is being followed by the Cancer Center at [facility].  His white blood cell count did improve to 3.0 prior to discharge.  His platelet count also improved to 75 prior to discharge.  An anemia panel was ordered before the blood transfusions.  The results were dictated above.  The patient was advised to take a multivitamin with iron once daily. Further management will be deferred to his primary care physician Dr. Lodema Hong and his oncologist.  The patient had a left heel ulcer which drained a little blood but otherwise it did not appear to be overly infected.  A melanoma was excised from the  heel in January 2 apparently.  Home health nursing had been dressing the left heel up until recently.  The patient's wife has taken over the wound care dressing changes at home.  The patient's pain subsided.  His blood pressure improved.  The physical therapist was consulted.  She recommended home health physical therapy which was ordered.  He complained of severe constipation.  Per the nursing staff, he was severely impacted with stool.  He was disimpacted manually.  He was started on a laxative regimen  of MiraLax and Senokot S.  He was discharged home on MiraLax b.i.d.     Elliot Cousin, M.D.     DF/MEDQ  D:  02/24/2011  T:  02/25/2011  Job:  [id number]  cc:   Milus Mallick. Lodema Hong, M.D. Fax: [phone]  Electronically Signed by Elliot Cousin M.D. on 02/25/2011 07:54:59 PM

## 2011-03-02 ENCOUNTER — Encounter: Payer: Self-pay | Admitting: Family Medicine

## 2011-03-03 ENCOUNTER — Ambulatory Visit (INDEPENDENT_AMBULATORY_CARE_PROVIDER_SITE_OTHER): Payer: Medicare Other | Admitting: Family Medicine

## 2011-03-03 ENCOUNTER — Encounter: Payer: Self-pay | Admitting: Family Medicine

## 2011-03-03 VITALS — BP 130/82 | HR 68 | Resp 16 | Ht 68.0 in | Wt 169.1 lb

## 2011-03-03 DIAGNOSIS — M549 Dorsalgia, unspecified: Secondary | ICD-10-CM

## 2011-03-03 DIAGNOSIS — C439 Malignant melanoma of skin, unspecified: Secondary | ICD-10-CM | POA: Insufficient documentation

## 2011-03-03 DIAGNOSIS — R5381 Other malaise: Secondary | ICD-10-CM

## 2011-03-03 DIAGNOSIS — R5383 Other fatigue: Secondary | ICD-10-CM

## 2011-03-03 DIAGNOSIS — I10 Essential (primary) hypertension: Secondary | ICD-10-CM

## 2011-03-03 DIAGNOSIS — T148XXA Other injury of unspecified body region, initial encounter: Secondary | ICD-10-CM

## 2011-03-03 DIAGNOSIS — R413 Other amnesia: Secondary | ICD-10-CM

## 2011-03-03 MED ORDER — MULTI-VITAMIN/MINERALS PO TABS: 1.0000 | ORAL_TABLET | Freq: Every day | ORAL | Status: AC

## 2011-03-03 NOTE — Patient Instructions (Addendum)
F/u in 3 month.  No more tractors or riding mower.  I expect that the discoloration on the left flank will totally go away in the next 3 months, it is old blood. CBCin 3 months

## 2011-03-05 HISTORY — PX: OTHER SURGICAL HISTORY: SHX169

## 2011-03-14 DIAGNOSIS — T148XXA Other injury of unspecified body region, initial encounter: Secondary | ICD-10-CM | POA: Insufficient documentation

## 2011-03-14 NOTE — Assessment & Plan Note (Signed)
Controlled, no change in medication  

## 2011-03-14 NOTE — Assessment & Plan Note (Signed)
Left flank hematoma much improved following a fall requiring hospitalization

## 2011-03-14 NOTE — Progress Notes (Signed)
  Subjective:    Patient ID: [name], male    DOB: 1926/04/21, 77 y.o.   MRN: [id number]  HPI The PT is here for follow up recent hospitalization for left flank hematoma,  and re-evaluation of chronic medical conditions, medication management and review of recent lab and radiology data.  Questions or concerns regarding consultations or procedures which the PT has had in the interim are  addressed. The PT denies any adverse reactions to current medications since the last visit.  There are no new concerns.  Both he and his wife has concerns as to how long it will take for the bruise to completely fade, I give them an approx 6 week window    Review of Systems Denies recent fever or chills. Denies sinus pressure, nasal congestion, ear pain or sore throat. Denies chest congestion, productive cough or wheezing. Denies chest pains, palpitations, paroxysmal nocturnal dyspnea, orthopnea and leg swelling Denies abdominal pain, nausea, vomiting,diarrhea or constipation.  Denies joint pain, swelling and limitation in mobility. Denies headaches, seizure, numbness, or tingling. Denies depression, anxiety or insomnia.       Objective:   Physical Exam Patient alert and oriented and in no Cardiopulmonary distress.  HEENT: No facial asymmetry, EOMI, no sinus tenderness, TM's clear, Oropharynx pink and moist.  Neck supple no adenopathy.  Chest: Clear to auscultation bilaterally.  CVS: S1, S2 no murmurs, no S3.  ABD: Soft non tender. Bowel sounds normal.  Ext: No edema  MS: decreased ROM spine, shoulders, hips and knees.  Skin: Intact, large bruise on left flank, approx 10 inch diameter Psych: Good eye contact, normal affect. Memoryss not anxious or depressed appearing.  CNS: CN 2-12 intact, power, tone and sensation normal throughout.        Assessment & Plan:

## 2011-03-14 NOTE — Assessment & Plan Note (Signed)
Improved as hematoma is resolving

## 2011-03-14 NOTE — Assessment & Plan Note (Signed)
Currently being followed by local derma, but also goes to [facility]

## 2011-03-14 NOTE — Assessment & Plan Note (Signed)
Pt to continue med to slow progress hopefully

## 2011-03-19 ENCOUNTER — Other Ambulatory Visit: Payer: Self-pay | Admitting: *Deleted

## 2011-03-19 MED ORDER — RIVASTIGMINE 9.5 MG/24HR TD PT24: 1.0000 | MEDICATED_PATCH | Freq: Every day | TRANSDERMAL | Status: DC

## 2011-03-23 ENCOUNTER — Other Ambulatory Visit: Payer: Self-pay | Admitting: Internal Medicine

## 2011-03-23 ENCOUNTER — Ambulatory Visit (INDEPENDENT_AMBULATORY_CARE_PROVIDER_SITE_OTHER): Payer: Medicare Other | Admitting: Internal Medicine

## 2011-03-23 ENCOUNTER — Encounter: Payer: Self-pay | Admitting: Internal Medicine

## 2011-03-23 DIAGNOSIS — I442 Atrioventricular block, complete: Secondary | ICD-10-CM

## 2011-03-23 DIAGNOSIS — I5022 Chronic systolic (congestive) heart failure: Secondary | ICD-10-CM

## 2011-03-23 DIAGNOSIS — I10 Essential (primary) hypertension: Secondary | ICD-10-CM

## 2011-03-23 DIAGNOSIS — Z950 Presence of cardiac pacemaker: Secondary | ICD-10-CM

## 2011-03-23 NOTE — Patient Instructions (Signed)
Your physician recommends that you schedule a follow-up appointment in: 1 year    Your physician recommends that you continue on your current medications as directed. Please refer to the Current Medication list given to you today.  

## 2011-03-24 ENCOUNTER — Encounter: Payer: Self-pay | Admitting: Internal Medicine

## 2011-03-24 NOTE — Assessment & Plan Note (Signed)
His symptoms are class II. He will continue his current medications.

## 2011-03-24 NOTE — Assessment & Plan Note (Signed)
His blood pressure is very well controlled. I've asked him to maintain a low-salt diet. He will continue his current medications.

## 2011-03-24 NOTE — Progress Notes (Signed)
HPI [name] returns today for followup. He is a very pleasant 77 year old man with a history of symptomatic bradycardia, hypertension, status post pacemaker insertion. The patient has had an eventful year. Since I saw him last approximately 12 months ago, he was diagnosed with melanoma on his left heel and underwent surgical therapy. In addition, the patient sustained an injury after his tractor fell over on him. He denies chest pain, syncope, but does have mild shortness of breath and mild peripheral edema. He admits to sodium indiscretion. Allergies  Allergen Reactions  . Codeine      Current Outpatient Prescriptions  Medication Sig Dispense Refill  . allopurinol (ZYLOPRIM) 300 MG tablet Take 300 mg by mouth daily.        Marland Kitchen amitriptyline (ELAVIL) 25 MG tablet Take 25 mg by mouth at bedtime.        Marland Kitchen CIALIS 10 MG tablet       . dorzolamide-timolol (COSOPT) 22.3-6.8 MG/ML ophthalmic solution       . fentaNYL (DURAGESIC) 50 MCG/HR Place 1 patch (50 mcg total) onto the skin every 3 (three) days. Apply one patch every three days  30 patch  0  . latanoprost (XALATAN) 0.005 % ophthalmic solution       . Meth-Hyo-M Bl-Na Phos-Ph Sal (URETRON D/S) TABS Take by mouth. Take one tablet by mouth three times a day       . Multiple Vitamins-Minerals (MULTIVITAMIN WITH MINERALS) tablet Take 1 tablet by mouth daily.  120 tablet  2  . nabumetone (RELAFEN) 500 MG tablet Take 500 mg by mouth daily. Take two tablets by mouth daily         . Omeprazole Magnesium 20.6 (20 BASE) MG CPDR Take by mouth. Take one tablet by mouth daily       . rivastigmine (EXELON) 9.5 mg/24hr Place 1 patch (9.5 mg total) onto the skin daily. Apply one patch daily then remove  30 patch  2  . Tamsulosin HCl (FLOMAX) 0.4 MG CAPS Take by mouth daily.        Marland Kitchen triamterene-hydrochlorothiazide (MAXZIDE-25) 37.5-25 MG per tablet Take 1 tablet by mouth daily.        . hydrochlorothiazide 25 MG tablet       . oxyCODONE-acetaminophen  (PERCOCET) 5-325 MG per tablet          Past Medical History  Diagnosis Date  . Chronic systolic heart failure   . Mitral regurgitation   . Bradycardia   . Syncope   . Screening for lipoid disorders   . Fatigue   . Rash and other nonspecific skin eruption   . Costochondritis, acute   . Glaucoma   . Erectile dysfunction   . Visual changes   . Cancer   . Melanoma in situ 2011  . Pancytopenia   . Pancytopenia     ROS:   All systems reviewed and negative except as noted in the HPI.   Past Surgical History  Procedure Date  . Back surgery     x3 most recent   . 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 Interstim  placed for intestitial cystitis   . Tonsillectomy around 1950  . Surgery to left elbow 2002  . Cataract extraction, bilateral left in 1997 right in 2003  . Pacemaker insertion 2010  . Left heel (cancerous area removed) Jan 2012     Family History  Problem Relation Age of Onset  . Dementia Mother   . Throat cancer Father   .  Cancer Father     throat  . Lung cancer Sister      History   Social History  . Marital Status: Married    Spouse Name: N/A    Number of Children: N/A  . Years of Education: N/A   Occupational History  . retired     Social History Main Topics  . Smoking status: Former Games developer  . Smokeless tobacco: Not on file  . Alcohol Use: No  . Drug Use: No  . Sexually Active: Not on file   Other Topics Concern  . Not on file   Social History Narrative  . No narrative on file     BP 106/69  Pulse 60  Wt 169 lb (76.658 kg)  Physical Exam:  Elderly, Well appearing NAD HEENT: Unremarkable Neck:  No JVD, no thyromegally Lymphatics:  No adenopathy Back:  No CVA tenderness Lungs:  Clear. Well-healed pacemaker incision. HEART:  Regular rate rhythm, no murmurs, no rubs, no clicks Abd:  Flat, positive bowel sounds, no organomegally, no rebound, no guarding Ext:  2 plus pulses, no edema, no cyanosis, no clubbing Skin:  No  rashes no nodules Neuro:  CN II through XII intact, motor grossly intact  DEVICE  Normal device function.  See PaceArt for details.   Assess/Plan:

## 2011-03-24 NOTE — Assessment & Plan Note (Signed)
His device is working normally. We'll recheck in several months. 

## 2011-04-01 ENCOUNTER — Ambulatory Visit: Payer: Medicare Other | Admitting: Family Medicine

## 2011-05-03 ENCOUNTER — Telehealth: Payer: Self-pay | Admitting: Family Medicine

## 2011-05-03 DIAGNOSIS — R413 Other amnesia: Secondary | ICD-10-CM

## 2011-05-03 MED ORDER — RIVASTIGMINE 9.5 MG/24HR TD PT24: 1.0000 | MEDICATED_PATCH | Freq: Every day | TRANSDERMAL | Status: DC

## 2011-05-03 NOTE — Telephone Encounter (Signed)
HE WANTS A 90 DAY SUPPLY

## 2011-05-03 NOTE — Telephone Encounter (Signed)
Med sent as requested 

## 2011-05-03 NOTE — Telephone Encounter (Signed)
90 DAY SUPPLY

## 2011-05-06 LAB — CBC WITH DIFFERENTIAL/PLATELET
Lymphocytes Relative: 29 % (ref 12–46)
Lymphs Abs: 0.5 10*3/uL — ABNORMAL LOW (ref 0.7–4.0)
MCV: 93.2 fL (ref 78.0–100.0)
Neutrophils Relative %: 57 % (ref 43–77)
Platelets: 82 10*3/uL — ABNORMAL LOW (ref 150–400)
RBC: 3.66 MIL/uL — ABNORMAL LOW (ref 4.22–5.81)
WBC: 1.8 10*3/uL — ABNORMAL LOW (ref 4.0–10.5)

## 2011-05-07 ENCOUNTER — Encounter: Payer: Self-pay | Admitting: Family Medicine

## 2011-05-10 ENCOUNTER — Encounter: Payer: Self-pay | Admitting: Family Medicine

## 2011-05-10 ENCOUNTER — Ambulatory Visit (INDEPENDENT_AMBULATORY_CARE_PROVIDER_SITE_OTHER): Payer: Medicare Other | Admitting: Family Medicine

## 2011-05-10 VITALS — BP 102/64 | HR 61 | Resp 16 | Ht 68.0 in | Wt 169.4 lb

## 2011-05-10 DIAGNOSIS — M159 Polyosteoarthritis, unspecified: Secondary | ICD-10-CM

## 2011-05-10 DIAGNOSIS — C439 Malignant melanoma of skin, unspecified: Secondary | ICD-10-CM

## 2011-05-10 DIAGNOSIS — M549 Dorsalgia, unspecified: Secondary | ICD-10-CM

## 2011-05-10 DIAGNOSIS — R413 Other amnesia: Secondary | ICD-10-CM

## 2011-05-10 DIAGNOSIS — D61818 Other pancytopenia: Secondary | ICD-10-CM

## 2011-05-10 DIAGNOSIS — F528 Other sexual dysfunction not due to a substance or known physiological condition: Secondary | ICD-10-CM

## 2011-05-10 DIAGNOSIS — M79609 Pain in unspecified limb: Secondary | ICD-10-CM

## 2011-05-10 DIAGNOSIS — M79601 Pain in right arm: Secondary | ICD-10-CM

## 2011-05-10 DIAGNOSIS — I10 Essential (primary) hypertension: Secondary | ICD-10-CM

## 2011-05-10 MED ORDER — FENTANYL 50 MCG/HR TD PT72: 1.0000 | MEDICATED_PATCH | TRANSDERMAL | Status: DC

## 2011-05-10 MED ORDER — AMITRIPTYLINE HCL 25 MG PO TABS: 25.0000 mg | ORAL_TABLET | Freq: Every day | ORAL | Status: DC

## 2011-05-10 NOTE — Patient Instructions (Signed)
F/u in 3 months.  No change in medication at this time.  You are being referred to Dr Hilda Lias for evaluation of arm pain  Drink Boost every   Day for increased strength.  I will refer you to home health for help with medication management

## 2011-05-10 NOTE — Assessment & Plan Note (Signed)
Pt states that the cialis is not working as well, he still has a lot, advised him to discuss further with his urologist

## 2011-05-10 NOTE — Progress Notes (Signed)
  Subjective:    Patient ID: [name], male    DOB: Feb 25, 1926, 77 y.o.   MRN: [id number]  HPI 2 month h/o localized right upper extremity pain from arm to forearm, worse in the latter. No h/o recent falls. Reports point tenderness over the right elbow Has increased back pain, asking about dose increase of the fentanyl , advised at his age,I would not do this Wants a change in his cialis, though the bottle is nearly full,Ii suggested he discuss fuirther with the urologist who treats him.   Review of Systems Denies recent fever or chills. Denies sinus pressure, nasal congestion, ear pain or sore throat. Denies chest congestion, productive cough or wheezing. Denies chest pains, palpitations and leg swelling Denies abdominal pain, nausea, vomiting,diarrhea or constipation.    Denies headaches, seizures, numbness, or tingling. Denies depression, anxiety or insomnia. Denies skin break down or rash.        Objective:   Physical Exam Patient alert and  in no cardiopulmonary distress.  HEENT: No facial asymmetry, EOMI, no sinus tenderness,  oropharynx pink and moist.  Neck decreased rOM no adenopathy.  Chest: Clear to auscultation bilaterally.  CVS: S1, S2 no murmurs, no S3.  ABD: Soft non tender. Bowel sounds normal.  Ext: No edema  MS: decreased  ROM spine, shoulders, hips and knees.Tender over right elbow  Skin: Intact, no ulcerations or rash noted.  Psych: Good eye contact, normal affect. Memory loss not anxious or depressed appearing.  CNS: CN 2-12 intact, power, tone and sensation normal throughout.        Assessment & Plan:

## 2011-05-10 NOTE — Assessment & Plan Note (Signed)
Uncontrolled pain will remain on same dose

## 2011-05-11 ENCOUNTER — Telehealth: Payer: Self-pay | Admitting: Family Medicine

## 2011-05-12 NOTE — Assessment & Plan Note (Signed)
[facility],patient and family preference, nothing new as far as dx or management from what has been done locally over the years

## 2011-05-12 NOTE — Assessment & Plan Note (Signed)
Controlled, no change in medication  

## 2011-05-12 NOTE — Assessment & Plan Note (Signed)
Acute right elbow and arm pain, will refer to orthopedics for eval and treatment

## 2011-05-12 NOTE — Assessment & Plan Note (Signed)
Followed locally as well as at [facility]

## 2011-05-12 NOTE — Assessment & Plan Note (Signed)
Unchanged, to seek help with medication management  From nursing

## 2011-05-12 NOTE — Telephone Encounter (Signed)
Dois Davenport from [facility] called and states that there were several meds on his list that he did not have. They were prilosec, allopurinol, relafan and Ureton. Also she mentioned oxycodone was on the list but I put he was not taking at [facility] it but it was still on the list. She wants to know if he needs these meds listed?

## 2011-05-12 NOTE — Telephone Encounter (Signed)
Advise do not list any of those , except the ureton, I think Riley Nearing is from urology, not from me, so until I am sure he is not to be on this then leave that one, can send for his last urology visit to clarify what is being prescribed from that office

## 2011-05-12 NOTE — Assessment & Plan Note (Signed)
Reportedly increased and uncontrolled, no med changes at this time

## 2011-05-13 NOTE — Telephone Encounter (Signed)
De came by the office and said he needed omeprazole 20 qd, Relafen 500 bid, allopurinol 300 qd and amitriptyline 25 qd. Wants 90 day supply sent to [facility]

## 2011-05-14 ENCOUNTER — Encounter: Payer: Self-pay | Admitting: Family Medicine

## 2011-05-14 MED ORDER — AMITRIPTYLINE HCL 25 MG PO TABS: 25.0000 mg | ORAL_TABLET | Freq: Every day | ORAL | Status: DC

## 2011-05-14 MED ORDER — TAMSULOSIN HCL 0.4 MG PO CAPS: ORAL_CAPSULE | ORAL | Status: DC

## 2011-05-14 MED ORDER — NABUMETONE 500 MG PO TABS: ORAL_TABLET | ORAL | Status: DC

## 2011-05-14 MED ORDER — ALLOPURINOL 300 MG PO TABS: 300.0000 mg | ORAL_TABLET | Freq: Every day | ORAL | Status: DC

## 2011-05-14 MED ORDER — OMEPRAZOLE 20 MG PO CPDR: 20.0000 mg | DELAYED_RELEASE_CAPSULE | Freq: Every day | ORAL | Status: DC

## 2011-05-14 NOTE — Telephone Encounter (Signed)
amitriptyline 90 day supply was just prescribed,explain/and sort this out with pt pls, ok to send in 90 day supply of the other 3 requested, relefan , omeprazole and allopurinol to pharmacy of his choice with 1 refill

## 2011-05-14 NOTE — Telephone Encounter (Signed)
meds sent to [facility] as requested

## 2011-05-17 ENCOUNTER — Encounter: Payer: Self-pay | Admitting: Family Medicine

## 2011-05-18 ENCOUNTER — Ambulatory Visit: Payer: Medicare Other | Admitting: Family Medicine

## 2011-05-19 ENCOUNTER — Encounter: Payer: Self-pay | Admitting: Family Medicine

## 2011-06-03 ENCOUNTER — Ambulatory Visit: Payer: Medicare Other | Admitting: Family Medicine

## 2011-06-08 ENCOUNTER — Other Ambulatory Visit: Payer: Self-pay

## 2011-06-08 DIAGNOSIS — M159 Polyosteoarthritis, unspecified: Secondary | ICD-10-CM

## 2011-06-11 ENCOUNTER — Other Ambulatory Visit: Payer: Self-pay

## 2011-06-11 DIAGNOSIS — M159 Polyosteoarthritis, unspecified: Secondary | ICD-10-CM

## 2011-06-11 MED ORDER — FENTANYL 50 MCG/HR TD PT72: 1.0000 | MEDICATED_PATCH | TRANSDERMAL | Status: DC

## 2011-06-29 LAB — DIFFERENTIAL
Basophils Absolute: 0
Basophils Relative: 1
Eosinophils Absolute: 0
Eosinophils Relative: 2
Lymphocytes Relative: 26
Lymphs Abs: 0.6 — ABNORMAL LOW
Monocytes Absolute: 0.3
Monocytes Relative: 11
Neutro Abs: 1.4 — ABNORMAL LOW
Neutrophils Relative %: 60

## 2011-06-29 LAB — CBC
HCT: 30.3 — ABNORMAL LOW
Hemoglobin: 10.4 — ABNORMAL LOW
RBC: 3.4 — ABNORMAL LOW
WBC: 2.4 — ABNORMAL LOW

## 2011-07-05 LAB — CBC
HCT: 34.4 — ABNORMAL LOW
Hemoglobin: 11.8 — ABNORMAL LOW
Platelets: 88 — ABNORMAL LOW
WBC: 1.9 — ABNORMAL LOW

## 2011-07-05 LAB — BASIC METABOLIC PANEL
GFR calc non Af Amer: 59 — ABNORMAL LOW
Potassium: 3.7
Sodium: 141

## 2011-07-05 LAB — DIFFERENTIAL
Eosinophils Relative: 4
Lymphocytes Relative: 27
Lymphs Abs: 0.5 — ABNORMAL LOW

## 2011-07-05 LAB — URIC ACID: Uric Acid, Serum: 4.2

## 2011-07-08 LAB — DIFFERENTIAL
Basophils Relative: 1 % (ref 0–1)
Eosinophils Relative: 6 % — ABNORMAL HIGH (ref 0–5)
Lymphs Abs: 0.5 10*3/uL — ABNORMAL LOW (ref 0.7–4.0)
Monocytes Absolute: 0.2 10*3/uL (ref 0.1–1.0)
Neutro Abs: 0.8 10*3/uL — ABNORMAL LOW (ref 1.7–7.7)

## 2011-07-08 LAB — CBC
HCT: 35 % — ABNORMAL LOW (ref 39.0–52.0)
Hemoglobin: 11.8 g/dL — ABNORMAL LOW (ref 13.0–17.0)
MCV: 91.6 fL (ref 78.0–100.0)
Platelets: 86 10*3/uL — ABNORMAL LOW (ref 150–400)
RBC: 3.82 MIL/uL — ABNORMAL LOW (ref 4.22–5.81)
WBC: 1.6 10*3/uL — ABNORMAL LOW (ref 4.0–10.5)

## 2011-07-08 LAB — BASIC METABOLIC PANEL
BUN: 16 mg/dL (ref 6–23)
Chloride: 102 mEq/L (ref 96–112)
Potassium: 3.9 mEq/L (ref 3.5–5.1)
Sodium: 137 mEq/L (ref 135–145)

## 2011-08-06 ENCOUNTER — Encounter: Payer: Self-pay | Admitting: Family Medicine

## 2011-08-10 ENCOUNTER — Ambulatory Visit (INDEPENDENT_AMBULATORY_CARE_PROVIDER_SITE_OTHER): Payer: Medicare Other | Admitting: Family Medicine

## 2011-08-10 ENCOUNTER — Encounter: Payer: Self-pay | Admitting: Family Medicine

## 2011-08-10 VITALS — BP 160/90 | HR 62 | Resp 16 | Ht 68.0 in | Wt 170.4 lb

## 2011-08-10 DIAGNOSIS — I10 Essential (primary) hypertension: Secondary | ICD-10-CM

## 2011-08-10 DIAGNOSIS — R413 Other amnesia: Secondary | ICD-10-CM

## 2011-08-10 DIAGNOSIS — Z23 Encounter for immunization: Secondary | ICD-10-CM

## 2011-08-10 DIAGNOSIS — D61818 Other pancytopenia: Secondary | ICD-10-CM

## 2011-08-10 DIAGNOSIS — M109 Gout, unspecified: Secondary | ICD-10-CM

## 2011-08-10 DIAGNOSIS — M159 Polyosteoarthritis, unspecified: Secondary | ICD-10-CM

## 2011-08-10 LAB — URIC ACID: Uric Acid, Serum: 4.8 mg/dL (ref 4.0–7.8)

## 2011-08-10 MED ORDER — AMLODIPINE BESYLATE 2.5 MG PO TABS: 2.5000 mg | ORAL_TABLET | Freq: Every day | ORAL | Status: DC

## 2011-08-10 NOTE — Progress Notes (Signed)
  Subjective:    Patient ID: [name], male    DOB: 12/25/1925, 77 y.o.   MRN: [id number]  HPI The PT is here for follow up and re-evaluation of chronic medical conditions, medication management and review of any available recent lab and radiology data.  Preventive health is updated, specifically  Cancer screening and Immunization.   Questions or concerns regarding consultations or procedures which the PT has had in the interim are  addressed. The PT denies any adverse reactions to current medications since the last visit.  Pt is requesting medication review , which is very appropriate, since he does have several medications for arthritis some or most of which should be discontinued  There are no specific complaints       Review of Systems See HPI Denies recent fever or chills. Denies sinus pressure, nasal congestion, ear pain or sore throat. Denies chest congestion, productive cough or wheezing. Denies chest pains, palpitations and leg swelling Denies abdominal pain, nausea, vomiting,diarrhea or constipation.   Denies dysuria, frequency, hesitancy or incontinence.  Denies headaches, seizures, numbness, or tingling. Denies depression, anxiety or insomnia. Denies skin break down or rash.        Objective:   Physical Exam Patient alert and oriented and in no cardiopulmonary distress.  HEENT: No facial asymmetry, EOMI, no sinus tenderness,  oropharynx pink and moist.  Neck decreased ROM, no adenopathy.No jVD  Chest: Clear to auscultation bilaterally.  CVS: S1, S2  Systolic murmur, no S3.  ABD: Soft non tender. Bowel sounds normal.  Ext: No edema  WU:[id number] ROM spine, shoulders, hips and knees.  Skin: Intact, no ulcerations or rash noted.  Psych: Good eye contact, normal affect. Memory loss, mildly  anxious not  depressed appearing.  CNS: CN 2-12 intact, power, normal throughout.        Assessment & Plan:

## 2011-08-10 NOTE — Assessment & Plan Note (Signed)
Uncontrolled, off maxzide, c/o nocturia, will start norvasc

## 2011-08-10 NOTE — Patient Instructions (Signed)
Nurse BP check in 3 weeks.  MD f/u in 3 months.  Flu vaccine today.  New medication amlodipine 2.5mg  one daily  Uric acid level today. Stop the allopurinol, omeprazole and nabumatone as we discussed

## 2011-08-20 ENCOUNTER — Ambulatory Visit (INDEPENDENT_AMBULATORY_CARE_PROVIDER_SITE_OTHER): Payer: Medicare Other | Admitting: Urology

## 2011-08-20 DIAGNOSIS — N3010 Interstitial cystitis (chronic) without hematuria: Secondary | ICD-10-CM

## 2011-08-20 DIAGNOSIS — R351 Nocturia: Secondary | ICD-10-CM

## 2011-08-26 ENCOUNTER — Other Ambulatory Visit: Payer: Self-pay | Admitting: Family Medicine

## 2011-08-27 NOTE — Assessment & Plan Note (Signed)
Chronic, and followed by hematology

## 2011-08-27 NOTE — Assessment & Plan Note (Signed)
Unchanged, continue med as before 

## 2011-08-27 NOTE — Assessment & Plan Note (Signed)
No obvious deterioration at this time, pt to continue exelon patch

## 2011-08-31 ENCOUNTER — Ambulatory Visit (INDEPENDENT_AMBULATORY_CARE_PROVIDER_SITE_OTHER): Payer: Medicare Other

## 2011-08-31 VITALS — BP 130/64 | Wt 159.0 lb

## 2011-08-31 DIAGNOSIS — I10 Essential (primary) hypertension: Secondary | ICD-10-CM

## 2011-08-31 NOTE — Progress Notes (Signed)
Pt in for blood check. Checked by physician. No new orders or changes.

## 2011-08-31 NOTE — Progress Notes (Signed)
  Subjective:    Patient ID: [name], male    DOB: 07/19/1926, 77 y.o.   MRN: [id number]  HPI    Review of Systems     Objective:   Physical Exam        Assessment & Plan:

## 2011-09-06 ENCOUNTER — Ambulatory Visit (INDEPENDENT_AMBULATORY_CARE_PROVIDER_SITE_OTHER): Payer: Medicare Other | Admitting: *Deleted

## 2011-09-06 ENCOUNTER — Other Ambulatory Visit: Payer: Self-pay | Admitting: Internal Medicine

## 2011-09-06 ENCOUNTER — Encounter: Payer: Medicare Other | Admitting: *Deleted

## 2011-09-06 ENCOUNTER — Encounter: Payer: Self-pay | Admitting: Internal Medicine

## 2011-09-06 DIAGNOSIS — I5022 Chronic systolic (congestive) heart failure: Secondary | ICD-10-CM

## 2011-09-06 LAB — PACEMAKER DEVICE OBSERVATION
AL AMPLITUDE: 2.8 mv
AL THRESHOLD: 0.5 V
BAMS-0001: 150 {beats}/min
LV LEAD IMPEDENCE PM: 783 Ohm
LV LEAD THRESHOLD: 1 V

## 2011-09-22 ENCOUNTER — Telehealth: Payer: Self-pay | Admitting: Family Medicine

## 2011-09-22 NOTE — Telephone Encounter (Signed)
Patch is ready and will make aware no dose at this time

## 2011-09-22 NOTE — Telephone Encounter (Signed)
Wants to come if the mcg can be increased. Coming tomorrow to collect RX

## 2011-09-22 NOTE — Telephone Encounter (Signed)
No dose change at this time, needs to stat on same dose for now

## 2011-09-23 ENCOUNTER — Other Ambulatory Visit: Payer: Self-pay

## 2011-09-23 DIAGNOSIS — M159 Polyosteoarthritis, unspecified: Secondary | ICD-10-CM

## 2011-09-23 MED ORDER — FENTANYL 50 MCG/HR TD PT72: 1.0000 | MEDICATED_PATCH | TRANSDERMAL | Status: DC

## 2011-10-22 ENCOUNTER — Other Ambulatory Visit: Payer: Self-pay | Admitting: Family Medicine

## 2011-11-01 ENCOUNTER — Telehealth: Payer: Self-pay | Admitting: Family Medicine

## 2011-11-01 NOTE — Telephone Encounter (Signed)
Called pt and his phone rings with no answering machine. Will try again at another time.

## 2011-11-05 MED ORDER — AMITRIPTYLINE HCL 25 MG PO TABS: ORAL_TABLET | ORAL | Status: DC

## 2011-11-05 NOTE — Telephone Encounter (Signed)
Resent the med with note to dispense now because pt was out

## 2011-11-08 ENCOUNTER — Encounter: Payer: Self-pay | Admitting: Family Medicine

## 2011-11-10 ENCOUNTER — Ambulatory Visit (INDEPENDENT_AMBULATORY_CARE_PROVIDER_SITE_OTHER): Payer: Medicare Other | Admitting: Family Medicine

## 2011-11-10 ENCOUNTER — Encounter: Payer: Self-pay | Admitting: Family Medicine

## 2011-11-10 DIAGNOSIS — M549 Dorsalgia, unspecified: Secondary | ICD-10-CM

## 2011-11-10 DIAGNOSIS — I10 Essential (primary) hypertension: Secondary | ICD-10-CM

## 2011-11-10 DIAGNOSIS — Z79899 Other long term (current) drug therapy: Secondary | ICD-10-CM | POA: Diagnosis not present

## 2011-11-10 DIAGNOSIS — D61818 Other pancytopenia: Secondary | ICD-10-CM | POA: Diagnosis not present

## 2011-11-10 DIAGNOSIS — I498 Other specified cardiac arrhythmias: Secondary | ICD-10-CM

## 2011-11-10 DIAGNOSIS — G309 Alzheimer's disease, unspecified: Secondary | ICD-10-CM

## 2011-11-10 DIAGNOSIS — F0280 Dementia in other diseases classified elsewhere without behavioral disturbance: Secondary | ICD-10-CM

## 2011-11-10 DIAGNOSIS — F068 Other specified mental disorders due to known physiological condition: Secondary | ICD-10-CM

## 2011-11-10 NOTE — Assessment & Plan Note (Signed)
Suggest this be followed locally

## 2011-11-10 NOTE — Assessment & Plan Note (Signed)
Controlled, no change in medication  

## 2011-11-10 NOTE — Patient Instructions (Signed)
F/u in 4.5 month   Fasting lipid, chem 7 and TSH in 4.5 month  No changes in medication.  You will be referred to a case worker with [facility]    Keep reading, start puzzles and word search and additional TV shows...you need to stay awake!

## 2011-11-16 DIAGNOSIS — N3010 Interstitial cystitis (chronic) without hematuria: Secondary | ICD-10-CM | POA: Diagnosis not present

## 2011-11-17 ENCOUNTER — Encounter: Payer: Self-pay | Admitting: Family Medicine

## 2011-11-17 DIAGNOSIS — F0280 Dementia in other diseases classified elsewhere without behavioral disturbance: Secondary | ICD-10-CM | POA: Insufficient documentation

## 2011-11-17 NOTE — Assessment & Plan Note (Signed)
Continue exelon patch, and pt encouraged to be more interactive with environ, as far as reading etc

## 2011-11-17 NOTE — Assessment & Plan Note (Signed)
Reports increased pain, however note to be sleepy often, no change in med

## 2011-11-17 NOTE — Progress Notes (Signed)
  Subjective:    Patient ID: [name], male    DOB: Feb 05, 1926, 78 y.o.   MRN: [id number]  HPI The PT is here for follow up and re-evaluation of chronic medical conditions, medication management and review of any available recent lab and radiology data.  Preventive health is updated, specifically  Cancer screening and Immunization.   Questions or concerns regarding consultations or procedures which the PT has had in the interim are  addressed. Feels as though current pain patches are not as effective as ones he has got previously, I explained med dose has not changed. Wife states no disturbance in sleep from pain, actually note to be sleeping excessively. States he was told ears need flushing, however on exam this is not the case, he is reassured. Appetite is fairly good, and bowel movements regular   Review of Systems See HPI Denies recent fever or chills. Denies sinus pressure, nasal congestion, ear pain or sore throat. Denies chest congestion, productive cough or wheezing. Denies chest pains, palpitations and leg swelling Denies abdominal pain, nausea, vomiting,diarrhea or constipation.     Denies headaches, seizures, numbness, or tingling. Denies depression, anxiety or insomnia. Denies skin break down or rash.        Objective:   Physical Exam Patient alert and oriented and in no cardiopulmonary distress.  HEENT: No facial asymmetry, EOMI, no sinus tenderness,  oropharynx pink and moist.  Neck decreased ROM,no JVD, no adenopathy.  Chest: Clear to auscultation bilaterally.  CVS: S1, S2 systolic  murmur, no S3.  ABD: Soft non tender. Bowel sounds normal.  Ext: No edema  WG:[id number]  ROM spine, shoulders, hips and knees.  Skin: Intact, no ulcerations or rash noted.  Psych: Good eye contact, blunted affect. Memory impaired  not anxious or depressed appearing.  CNS: CN 2-12 intact, power, tone and sensation normal throughout.        Assessment & Plan:

## 2011-11-18 DIAGNOSIS — H4011X0 Primary open-angle glaucoma, stage unspecified: Secondary | ICD-10-CM | POA: Diagnosis not present

## 2011-11-18 DIAGNOSIS — Z961 Presence of intraocular lens: Secondary | ICD-10-CM | POA: Diagnosis not present

## 2011-11-18 DIAGNOSIS — H409 Unspecified glaucoma: Secondary | ICD-10-CM | POA: Diagnosis not present

## 2011-11-22 DIAGNOSIS — R300 Dysuria: Secondary | ICD-10-CM | POA: Diagnosis not present

## 2011-11-22 DIAGNOSIS — R351 Nocturia: Secondary | ICD-10-CM | POA: Diagnosis not present

## 2011-12-13 DIAGNOSIS — R351 Nocturia: Secondary | ICD-10-CM | POA: Diagnosis not present

## 2011-12-13 DIAGNOSIS — N411 Chronic prostatitis: Secondary | ICD-10-CM | POA: Diagnosis not present

## 2011-12-28 ENCOUNTER — Telehealth: Payer: Self-pay

## 2011-12-28 NOTE — Telephone Encounter (Signed)
Needs to bring in the old medicine or have pharmacy fax over, I do not know what he is asking for

## 2011-12-29 ENCOUNTER — Encounter: Payer: Self-pay | Admitting: Family Medicine

## 2011-12-29 ENCOUNTER — Ambulatory Visit (INDEPENDENT_AMBULATORY_CARE_PROVIDER_SITE_OTHER): Payer: Medicare Other | Admitting: Family Medicine

## 2011-12-29 VITALS — BP 130/84 | HR 67 | Resp 16 | Ht 68.0 in | Wt 170.0 lb

## 2011-12-29 DIAGNOSIS — B369 Superficial mycosis, unspecified: Secondary | ICD-10-CM | POA: Diagnosis not present

## 2011-12-29 DIAGNOSIS — F068 Other specified mental disorders due to known physiological condition: Secondary | ICD-10-CM | POA: Diagnosis not present

## 2011-12-29 DIAGNOSIS — F0280 Dementia in other diseases classified elsewhere without behavioral disturbance: Secondary | ICD-10-CM | POA: Diagnosis not present

## 2011-12-29 DIAGNOSIS — G309 Alzheimer's disease, unspecified: Secondary | ICD-10-CM | POA: Diagnosis not present

## 2011-12-29 DIAGNOSIS — I10 Essential (primary) hypertension: Secondary | ICD-10-CM | POA: Diagnosis not present

## 2011-12-29 MED ORDER — CLOTRIMAZOLE-BETAMETHASONE 1-0.05 % EX CREA: TOPICAL_CREAM | Freq: Two times a day (BID) | CUTANEOUS | Status: DC

## 2011-12-29 NOTE — Telephone Encounter (Signed)
Spoke with pt and he stated that he does not know the name and that he has already called the pharmacy and they do not have record of it.  He is going to make an appointment to be seen.

## 2011-12-29 NOTE — Patient Instructions (Signed)
F/u as before.  You have a mild fungal infection with eczema on the left side of your neck, medication is sent to your pharmacy

## 2011-12-29 NOTE — Assessment & Plan Note (Signed)
2 week h/o pruritic right neck rash

## 2012-01-02 NOTE — Assessment & Plan Note (Signed)
Unchanged , continue medication 

## 2012-01-02 NOTE — Assessment & Plan Note (Signed)
Controlled, no change in medication  

## 2012-01-02 NOTE — Progress Notes (Signed)
  Subjective:    Patient ID: [name], male    DOB: May 15, 1926, 78 y.o.   MRN: [id number]  HPI 2 week h/o pruritic rash on left posterior neck, has used treatment sucesfully in the past and requests same. Denies redness, drainage, fever or chills. No new toiletries or detergents, no other concerns   Review of Systems See HPI Denies recent fever or chills. Denies sinus pressure, nasal congestion, ear pain or sore throat. Denies chest congestion, productive cough or wheezing. Denies chest pains, palpitations and leg swelling  Denies depression, anxiety or insomnia.         Objective:   Physical Exam Patient alert and oriented and in no cardiopulmonary distress.  HEENT: No facial asymmetry, EOMI, no sinus tenderness,  oropharynx pink and moist.  Neck supple no adenopathy.  Chest: Clear to auscultation bilaterally.  CVS: S1, S2 murmur, systolic, no S3.  ABD: Soft non tender. Bowel sounds normal.  Ext: No edema  MS: decreased  ROM spine, shoulders, hips and knees.  Skin: Intact, fungal rash wit eczema on posterior left neck         Assessment & Plan:

## 2012-01-03 DIAGNOSIS — M25579 Pain in unspecified ankle and joints of unspecified foot: Secondary | ICD-10-CM | POA: Diagnosis not present

## 2012-01-05 DIAGNOSIS — H4011X0 Primary open-angle glaucoma, stage unspecified: Secondary | ICD-10-CM | POA: Diagnosis not present

## 2012-01-14 DIAGNOSIS — M25569 Pain in unspecified knee: Secondary | ICD-10-CM | POA: Diagnosis not present

## 2012-01-24 DIAGNOSIS — N411 Chronic prostatitis: Secondary | ICD-10-CM | POA: Diagnosis not present

## 2012-01-24 DIAGNOSIS — R300 Dysuria: Secondary | ICD-10-CM | POA: Diagnosis not present

## 2012-01-25 ENCOUNTER — Telehealth: Payer: Self-pay | Admitting: Family Medicine

## 2012-01-25 ENCOUNTER — Other Ambulatory Visit: Payer: Self-pay

## 2012-01-25 MED ORDER — RIVASTIGMINE 9.5 MG/24HR TD PT24: 1.0000 | MEDICATED_PATCH | Freq: Every day | TRANSDERMAL | Status: DC

## 2012-01-25 NOTE — Telephone Encounter (Signed)
Med sent.

## 2012-02-04 DIAGNOSIS — C4370 Malignant melanoma of unspecified lower limb, including hip: Secondary | ICD-10-CM | POA: Diagnosis not present

## 2012-02-08 DIAGNOSIS — R351 Nocturia: Secondary | ICD-10-CM | POA: Diagnosis not present

## 2012-02-08 DIAGNOSIS — N3010 Interstitial cystitis (chronic) without hematuria: Secondary | ICD-10-CM | POA: Diagnosis not present

## 2012-02-08 DIAGNOSIS — R300 Dysuria: Secondary | ICD-10-CM | POA: Diagnosis not present

## 2012-02-11 ENCOUNTER — Other Ambulatory Visit: Payer: Self-pay

## 2012-02-11 ENCOUNTER — Other Ambulatory Visit: Payer: Self-pay | Admitting: Family Medicine

## 2012-02-11 DIAGNOSIS — Z79899 Other long term (current) drug therapy: Secondary | ICD-10-CM

## 2012-02-11 DIAGNOSIS — M159 Polyosteoarthritis, unspecified: Secondary | ICD-10-CM

## 2012-02-11 MED ORDER — FENTANYL 50 MCG/HR TD PT72: 1.0000 | MEDICATED_PATCH | TRANSDERMAL | Status: DC

## 2012-02-12 LAB — DRUG SCREEN, URINE
Benzodiazepines.: NEGATIVE
Cocaine Metabolites: NEGATIVE
Creatinine,U: 141.25 mg/dL
Marijuana Metabolite: NEGATIVE
Opiates: NEGATIVE
Phencyclidine (PCP): NEGATIVE
Propoxyphene: NEGATIVE

## 2012-02-24 DIAGNOSIS — Z961 Presence of intraocular lens: Secondary | ICD-10-CM | POA: Diagnosis not present

## 2012-02-24 DIAGNOSIS — H4011X0 Primary open-angle glaucoma, stage unspecified: Secondary | ICD-10-CM | POA: Diagnosis not present

## 2012-02-24 DIAGNOSIS — H409 Unspecified glaucoma: Secondary | ICD-10-CM | POA: Diagnosis not present

## 2012-03-22 DIAGNOSIS — B351 Tinea unguium: Secondary | ICD-10-CM | POA: Diagnosis not present

## 2012-03-22 DIAGNOSIS — M79609 Pain in unspecified limb: Secondary | ICD-10-CM | POA: Diagnosis not present

## 2012-04-10 ENCOUNTER — Encounter: Payer: Self-pay | Admitting: Internal Medicine

## 2012-04-10 ENCOUNTER — Ambulatory Visit (INDEPENDENT_AMBULATORY_CARE_PROVIDER_SITE_OTHER): Payer: Medicare Other | Admitting: Internal Medicine

## 2012-04-10 DIAGNOSIS — I5022 Chronic systolic (congestive) heart failure: Secondary | ICD-10-CM

## 2012-04-10 DIAGNOSIS — Z950 Presence of cardiac pacemaker: Secondary | ICD-10-CM | POA: Diagnosis not present

## 2012-04-10 DIAGNOSIS — R55 Syncope and collapse: Secondary | ICD-10-CM

## 2012-04-10 DIAGNOSIS — I10 Essential (primary) hypertension: Secondary | ICD-10-CM | POA: Diagnosis not present

## 2012-04-10 LAB — PACEMAKER DEVICE OBSERVATION
AL AMPLITUDE: 2.8 mv
AL THRESHOLD: 1 V
LV LEAD IMPEDENCE PM: 785 Ohm
LV LEAD THRESHOLD: 1 V
RV LEAD IMPEDENCE PM: 442 Ohm
RV LEAD THRESHOLD: 1 V
VENTRICULAR PACING PM: 100

## 2012-04-10 NOTE — Progress Notes (Signed)
HPI Mr. Lewter returns today for followup. He is a pleasant elderly man with CHB, severe mitral regurgitation, non-ischemic CM, class 2 CHF, s/p BiV PPM. In the interim he has done well. No chest pain or sob. Minimal peripheral edema. No syncope. Allergies  Allergen Reactions  . Codeine      Current Outpatient Prescriptions  Medication Sig Dispense Refill  . allopurinol (ZYLOPRIM) 300 MG tablet Take 300 mg by mouth daily.      Marland Kitchen amitriptyline (ELAVIL) 25 MG tablet Take 25 mg by mouth at bedtime.      Marland Kitchen amLODipine (NORVASC) 2.5 MG tablet Take 1 tablet (2.5 mg total) by mouth daily.  30 tablet  11  . celecoxib (CELEBREX) 200 MG capsule Take 200 mg by mouth 2 (two) times daily.      . clotrimazole-betamethasone (LOTRISONE) cream Apply topically 2 (two) times daily.  45 g  1  . cyclobenzaprine (FLEXERIL) 10 MG tablet Take 10 mg by mouth at bedtime.        . dorzolamide-timolol (COSOPT) 22.3-6.8 MG/ML ophthalmic solution       . fentaNYL (DURAGESIC) 50 MCG/HR Place 1 patch (50 mcg total) onto the skin every 3 (three) days.  30 patch  0  . latanoprost (XALATAN) 0.005 % ophthalmic solution       . Meth-Hyo-M Bl-Na Phos-Ph Sal (URETRON D/S) TABS Take 81.6 each by mouth every 6 (six) hours as needed. Take one tablet by mouth three times a day      . rivastigmine (EXELON) 4.6 mg/24hr Place 1 patch onto the skin daily.      . rivastigmine (EXELON) 9.5 mg/24hr Place 1 patch (9.5 mg total) onto the skin daily.  90 patch  1  . Tamsulosin HCl (FLOMAX) 0.4 MG CAPS Take by mouth.      . triamterene-hydrochlorothiazide (MAXZIDE-25) 37.5-25 MG per tablet Take 1 tablet by mouth daily.         Past Medical History  Diagnosis Date  . Chronic systolic heart failure   . Mitral regurgitation   . Bradycardia   . Syncope   . Screening for lipoid disorders   . Fatigue   . Rash and other nonspecific skin eruption   . Costochondritis, acute   . Glaucoma   . Erectile dysfunction   . Visual changes   . Cancer    . Melanoma in situ 2011  . Pancytopenia   . Pancytopenia     ROS:   All systems reviewed and negative except as noted in the HPI.   Past Surgical History  Procedure Date  . Back surgery     x3 most recent   . 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 Interstim  placed for intestitial cystitis   . Tonsillectomy around 1950  . Surgery to left elbow 2002  . Cataract extraction, bilateral left in 1997 right in 2003  . Pacemaker insertion 2010  . Left heel (cancerous area removed) Jan 2012     Family History  Problem Relation Age of Onset  . Dementia Mother   . Throat cancer Father   . Cancer Father     throat  . Lung cancer Sister      History   Social History  . Marital Status: Married    Spouse Name: N/A    Number of Children: N/A  . Years of Education: N/A   Occupational History  . retired     Social History Main Topics  . Smoking status: Former Games developer  .  Smokeless tobacco: Not on file  . Alcohol Use: No  . Drug Use: No  . Sexually Active: Not on file   Other Topics Concern  . Not on file   Social History Narrative  . No narrative on file      BP- 138/67, P 60, R- 18 Physical Exam:  Well appearing NAD HEENT: Unremarkable Neck:  8 cm JVD, no thyromegally Lungs:  Clear with no wheezes, rales, or rhonchi. Well healed PPM incision. HEART:  Regular rate rhythm, 3/6 MR murmur at the base, no rubs, no clicks Abd:  soft, positive bowel sounds, no organomegally, no rebound, no guarding Ext:  2 plus pulses, no edema, no cyanosis, no clubbing Skin:  No rashes no nodules Neuro:  CN II through XII intact, motor grossly intact  DEVICE  Normal device function.  See PaceArt for details.   Assess/Plan:

## 2012-04-10 NOTE — Patient Instructions (Addendum)
Your physician recommends that you continue on your current medications as directed. Please refer to the Current Medication list given to you today.  Your physician recommends that you schedule a follow-up appointment in: 6 months for a devise check and in 1 year with Dr. Ladona Ridgel

## 2012-04-10 NOTE — Assessment & Plan Note (Signed)
His blood pressure is fairly well controlled. Continue current meds. I have discussed the importance of a low sodium diet.

## 2012-04-10 NOTE — Assessment & Plan Note (Signed)
His symptoms remain class 2. He will continue his current meds and maintain a low sodium diet. 

## 2012-04-10 NOTE — Assessment & Plan Note (Signed)
His BiV PPM is working normally. Will recheck in several months. 

## 2012-04-12 ENCOUNTER — Encounter: Payer: Self-pay | Admitting: Internal Medicine

## 2012-04-14 DIAGNOSIS — I10 Essential (primary) hypertension: Secondary | ICD-10-CM | POA: Diagnosis not present

## 2012-04-14 DIAGNOSIS — D61818 Other pancytopenia: Secondary | ICD-10-CM | POA: Diagnosis not present

## 2012-04-14 DIAGNOSIS — Z79899 Other long term (current) drug therapy: Secondary | ICD-10-CM | POA: Diagnosis not present

## 2012-04-15 LAB — BASIC METABOLIC PANEL
CO2: 29 mEq/L (ref 19–32)
Calcium: 8.9 mg/dL (ref 8.4–10.5)
Chloride: 108 mEq/L (ref 96–112)
Creat: 0.95 mg/dL (ref 0.50–1.35)
Sodium: 144 mEq/L (ref 135–145)

## 2012-04-15 LAB — LIPID PANEL
LDL Cholesterol: 87 mg/dL (ref 0–99)
VLDL: 10 mg/dL (ref 0–40)

## 2012-04-19 ENCOUNTER — Ambulatory Visit (HOSPITAL_COMMUNITY)
Admission: RE | Admit: 2012-04-19 | Discharge: 2012-04-19 | Disposition: A | Discharge status: Home / Self Care | Payer: Medicare Other | Source: Ambulatory Visit | Attending: Family Medicine | Admitting: Family Medicine

## 2012-04-19 ENCOUNTER — Encounter: Payer: Self-pay | Admitting: Family Medicine

## 2012-04-19 ENCOUNTER — Other Ambulatory Visit: Payer: Self-pay | Admitting: Family Medicine

## 2012-04-19 ENCOUNTER — Ambulatory Visit (INDEPENDENT_AMBULATORY_CARE_PROVIDER_SITE_OTHER): Payer: Medicare Other | Admitting: Family Medicine

## 2012-04-19 VITALS — BP 130/84 | HR 68 | Resp 16 | Ht 68.0 in | Wt 162.0 lb

## 2012-04-19 DIAGNOSIS — D61818 Other pancytopenia: Secondary | ICD-10-CM

## 2012-04-19 DIAGNOSIS — R5383 Other fatigue: Secondary | ICD-10-CM

## 2012-04-19 DIAGNOSIS — R194 Change in bowel habit: Secondary | ICD-10-CM

## 2012-04-19 DIAGNOSIS — IMO0002 Reserved for concepts with insufficient information to code with codable children: Secondary | ICD-10-CM | POA: Diagnosis not present

## 2012-04-19 DIAGNOSIS — G8929 Other chronic pain: Secondary | ICD-10-CM | POA: Diagnosis not present

## 2012-04-19 DIAGNOSIS — Z1211 Encounter for screening for malignant neoplasm of colon: Secondary | ICD-10-CM | POA: Diagnosis not present

## 2012-04-19 DIAGNOSIS — M549 Dorsalgia, unspecified: Secondary | ICD-10-CM | POA: Diagnosis not present

## 2012-04-19 DIAGNOSIS — R198 Other specified symptoms and signs involving the digestive system and abdomen: Secondary | ICD-10-CM

## 2012-04-19 DIAGNOSIS — R5381 Other malaise: Secondary | ICD-10-CM

## 2012-04-19 DIAGNOSIS — I10 Essential (primary) hypertension: Secondary | ICD-10-CM

## 2012-04-19 LAB — CBC
HCT: 33.1 % — ABNORMAL LOW (ref 39.0–52.0)
Hemoglobin: 11.5 g/dL — ABNORMAL LOW (ref 13.0–17.0)
MCHC: 34.7 g/dL (ref 30.0–36.0)
RBC: 3.61 MIL/uL — ABNORMAL LOW (ref 4.22–5.81)

## 2012-04-19 LAB — POC HEMOCCULT BLD/STL (OFFICE/1-CARD/DIAGNOSTIC): Fecal Occult Blood, POC: NEGATIVE

## 2012-04-19 NOTE — Patient Instructions (Addendum)
F/u in 6 weeks.  You are referred to Dr Karilyn Cota for investigation for weight loss with poor appetite and change in bowel movement.  Please STOP the fentanyl patch for pain.  Use topical rub like bengay for the mid back pain, I will hold on referral to pain doctor for now.  X ray of mid back today

## 2012-04-19 NOTE — Assessment & Plan Note (Signed)
6 to 10 month h/o change in bM and weight loss

## 2012-04-23 NOTE — Assessment & Plan Note (Signed)
Controlled, no change in medication  

## 2012-04-23 NOTE — Assessment & Plan Note (Signed)
Significantly worsening, review cbc, some may be medication related as well as due to his dementia

## 2012-04-23 NOTE — Progress Notes (Signed)
  Subjective:    Patient ID: [name], male    DOB: 02/15/1926, 78 y.o.   MRN: [id number]  HPI Pt in with his wife c/o increasing fatigue with excessive sleepiness, being increasingly inactive, with poor appetite and weight loss. States he has lso noted a change in his bowel movements with "pebbles" Though he has cholelithiasis, denies significant pain, bloating or nausea or excessive belching, does not attribute this as a cause of his symptoms States hios back pain continues , despite medication and is more interested in topical rub than addtiional medication , which is a good thing , as I believe he may be over medicated   Review of Systems See HPI Denies recent fever or chills. Denies sinus pressure, nasal congestion, ear pain or sore throat. Denies chest congestion, productive cough or wheezing. Denies chest pains, palpitations and leg swelling Denies abdominal pain, nausea, vomiting,diarrhea or constipation.   Denies dysuria, frequency, hesitancy or incontinence.  Denies headaches, seizures, numbness, or tingling. Denies depression, anxiety or insomnia. Denies skin break down or rash.        Objective:   Physical Exam Patient  drowsy, and in no cardiopulmonary distress.  HEENT: No facial asymmetry, EOMI, no sinus tenderness,  oropharynx pink and moist.  Neck decreased ROM,no adenopathy.  Chest: Clear to auscultation bilaterally.  CVS: S1, S2 no murmurs, no S3.  ABD: Soft non tender. Bowel sounds normal.No palpable organomegaly or masses. Rectal: no mass, guaiac negative stool  Ext: No edema  NW:[id number]  ROM spine, shoulders, hips and knees.  Skin: Intact, no ulcerations or rash noted.  Psych: Good eye contact, blunted  affect. Memory loss not anxious or depressed appearing.  CNS: CN 2-12 intact, power, tone and sensation normal throughout.        Assessment & Plan:

## 2012-04-23 NOTE — Assessment & Plan Note (Signed)
Unchanged, pt may be over medicated with fentanyl patch since he reports excessive sleepiness, will d/c same

## 2012-04-23 NOTE — Assessment & Plan Note (Signed)
Followed by oncology, no significant change in numbers noted

## 2012-04-25 ENCOUNTER — Telehealth: Payer: Self-pay | Admitting: Family Medicine

## 2012-04-25 NOTE — Telephone Encounter (Signed)
pls advise nio change in back on xray, no fracture noted

## 2012-04-27 NOTE — Telephone Encounter (Signed)
Patient aware.

## 2012-05-02 ENCOUNTER — Telehealth: Payer: Self-pay | Admitting: Family Medicine

## 2012-05-02 NOTE — Telephone Encounter (Signed)
Nurse pls administer depo medrol 40mg  iM, nurse visit only for back pain

## 2012-05-02 NOTE — Telephone Encounter (Signed)
Schedule nurse visit please

## 2012-05-02 NOTE — Telephone Encounter (Signed)
Does this pt need an appointment or can he come in for an injection?

## 2012-05-02 NOTE — Telephone Encounter (Signed)
Pt has appt to get shsot in morning at 9:00. aware

## 2012-05-03 ENCOUNTER — Ambulatory Visit (INDEPENDENT_AMBULATORY_CARE_PROVIDER_SITE_OTHER): Payer: Medicare Other

## 2012-05-03 VITALS — BP 140/86 | Wt 164.0 lb

## 2012-05-03 DIAGNOSIS — M549 Dorsalgia, unspecified: Secondary | ICD-10-CM

## 2012-05-03 MED ORDER — METHYLPREDNISOLONE ACETATE 40 MG/ML IJ SUSP
40.0000 mg | Freq: Once | INTRAMUSCULAR | Status: AC
  Administered 2012-05-03: 40 mg via INTRAMUSCULAR

## 2012-05-03 NOTE — Progress Notes (Signed)
Patient in for injection for back pain.  Depo medrol 40mg  given IM in right gluteal.  No sign or symptom of adverse reaction.  No voiced complaints.  Patient discharge in stable condition.

## 2012-05-09 ENCOUNTER — Ambulatory Visit (INDEPENDENT_AMBULATORY_CARE_PROVIDER_SITE_OTHER): Payer: Medicare Other | Admitting: Internal Medicine

## 2012-05-09 ENCOUNTER — Encounter (INDEPENDENT_AMBULATORY_CARE_PROVIDER_SITE_OTHER): Payer: Self-pay | Admitting: Internal Medicine

## 2012-05-09 VITALS — BP 120/80 | HR 56 | Temp 98.1°F | Ht 71.0 in | Wt 162.6 lb

## 2012-05-09 DIAGNOSIS — K219 Gastro-esophageal reflux disease without esophagitis: Secondary | ICD-10-CM

## 2012-05-09 NOTE — Progress Notes (Signed)
Subjective:     Patient ID: [name], male   DOB: [birth year], 78 y.o.   MRN: [id number]  HPIReferred by Dr Lodema Hong for mid abdominal pain. He has had the pain for 5-6 months.  He has the pain occasionally.  He says he has back pain 90% of the time.  His pain is sporadic and is not relate to eating. Sometimes it feels like foods are sitting in his stomach. BM every 3-4 days. He takes fiber to keep his BM soft. No melena or bright red rectal bleeding. Appetite is not good.  He tells me me has lost 13 pounds over the past year.  Colonoscopy in 2007 by Dr. Jena Gauss: IMPRESSION: Some granularity and erosions of the rectum of uncertain  significance biopsied. A long redundant, but otherwise normal-appearing  colon.   Review of Systems see hpi Current Outpatient Prescriptions  Medication Sig Dispense Refill  . amLODipine (NORVASC) 2.5 MG tablet Take 1 tablet (2.5 mg total) by mouth daily.  30 tablet  11  . calcium carbonate 200 MG capsule Take 750 mg by mouth 2 (two) times daily with a meal.      . cyclobenzaprine (FLEXERIL) 10 MG tablet Take 10 mg by mouth at bedtime.        . dorzolamide-timolol (COSOPT) 22.3-6.8 MG/ML ophthalmic solution       . fentaNYL (DURAGESIC - DOSED MCG/HR) 50 MCG/HR Place 1 patch onto the skin every 3 (three) days.      Marland Kitchen latanoprost (XALATAN) 0.005 % ophthalmic solution       . rivastigmine (EXELON) 9.5 mg/24hr Place 1 patch (9.5 mg total) onto the skin daily.  90 patch  1  . Tamsulosin HCl (FLOMAX) 0.4 MG CAPS Take by mouth.      Marland Kitchen allopurinol (ZYLOPRIM) 300 MG tablet Take 300 mg by mouth daily.      Marland Kitchen amitriptyline (ELAVIL) 25 MG tablet Take 25 mg by mouth at bedtime.      . celecoxib (CELEBREX) 200 MG capsule Take 200 mg by mouth 2 (two) times daily.      . clotrimazole-betamethasone (LOTRISONE) cream Apply topically 2 (two) times daily.  45 g  1  . Meth-Hyo-M Bl-Na Phos-Ph Sal (URETRON D/S) TABS Take 81.6 each by mouth every 6 (six) hours as needed. Take one tablet  by mouth three times a day      . triamterene-hydrochlorothiazide (MAXZIDE-25) 37.5-25 MG per tablet Take 1 tablet by mouth daily.       Past Medical History  Diagnosis Date  . Chronic systolic heart failure   . Mitral regurgitation   . Bradycardia   . Syncope   . Screening for lipoid disorders   . Fatigue   . Rash and other nonspecific skin eruption   . Costochondritis, acute   . Glaucoma   . Erectile dysfunction   . Visual changes   . Cancer   . Melanoma in situ 2011  . Pancytopenia   . Pancytopenia   \ Past Surgical History  Procedure Date  . Back surgery     x3 most recent   . 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 Interstim  placed for intestitial cystitis   . Tonsillectomy around 1950  . Surgery to left elbow 2002  . Cataract extraction, bilateral left in 1997 right in 2003  . Pacemaker insertion 2010  . Left heel (cancerous area removed) Jan 2012   Family Status  Relation Status Death Age  . Mother Deceased  Alzheimer  . Father Deceased     Throat cancer  . Sister Deceased    History   Social History  . Marital Status: Married    Spouse Name: N/A    Number of Children: N/A  . Years of Education: N/A   Occupational History  . retired     Social History Main Topics  . Smoking status: Former Games developer  . Smokeless tobacco: Not on file  . Alcohol Use: No  . Drug Use: No  . Sexually Active: Not on file   Other Topics Concern  . Not on file   Social History Narrative  . No narrative on file   Allergies  Allergen Reactions  . Codeine         Objective:   Physical Exam Filed Vitals:   05/09/12 1019  Height: 5\' 11"  (1.803 m)  Weight: 162 lb 9.6 oz (73.755 kg)  Alert and oriented. Skin warm and dry. Oral mucosa is moist.   . Sclera anicteric, conjunctivae is pink. Thyroid not enlarged. No cervical lymphadenopathy. Lungs clear. Heart regular rate and rhythm.  Abdomen is soft. Bowel sounds are positive. No hepatomegaly. No abdominal  masses felt. No tenderness.  No edema to lower extremities. Patient is alert and oriented.       Assessment:    Mid abdominal tenderness: very vague symptoms. He says there has been no change in his stools.      Plan:    Zegrid 5 bottles samples to patient.  . If not better may consider EGD.  OV in 1 month

## 2012-05-09 NOTE — Patient Instructions (Signed)
Zegrid one 30 minutes before breakfast. OV in one months with me. If not better EGD.

## 2012-05-23 DIAGNOSIS — R351 Nocturia: Secondary | ICD-10-CM | POA: Diagnosis not present

## 2012-05-23 DIAGNOSIS — R350 Frequency of micturition: Secondary | ICD-10-CM | POA: Diagnosis not present

## 2012-05-31 ENCOUNTER — Ambulatory Visit (INDEPENDENT_AMBULATORY_CARE_PROVIDER_SITE_OTHER): Payer: Medicare Other | Admitting: Family Medicine

## 2012-05-31 ENCOUNTER — Encounter: Payer: Self-pay | Admitting: Family Medicine

## 2012-05-31 VITALS — BP 130/80 | HR 58 | Resp 15 | Ht 68.0 in | Wt 161.0 lb

## 2012-05-31 DIAGNOSIS — K219 Gastro-esophageal reflux disease without esophagitis: Secondary | ICD-10-CM

## 2012-05-31 DIAGNOSIS — G8929 Other chronic pain: Secondary | ICD-10-CM

## 2012-05-31 DIAGNOSIS — I5022 Chronic systolic (congestive) heart failure: Secondary | ICD-10-CM

## 2012-05-31 DIAGNOSIS — M549 Dorsalgia, unspecified: Secondary | ICD-10-CM

## 2012-05-31 DIAGNOSIS — R29898 Other symptoms and signs involving the musculoskeletal system: Secondary | ICD-10-CM | POA: Diagnosis not present

## 2012-05-31 DIAGNOSIS — D61818 Other pancytopenia: Secondary | ICD-10-CM

## 2012-05-31 NOTE — Patient Instructions (Addendum)
F/u in early October.  You are referred to physical therapy for weakness in legs, and I am prescribing a cane  I will send Dr . Karilyn Cota a message about your intolerance to zegerid, pls keep appointment with that office for follow up

## 2012-06-05 NOTE — Progress Notes (Signed)
  Subjective:    Patient ID: [name], male    DOB: 1926-01-19, 78 y.o.   MRN: [id number]  HPI The PT is here for follow up and re-evaluation of chronic medical conditions, medication management and review of any available recent lab and radiology data.  Recently seen by GI, zegerid prescribed, but patient has discontinued this stating he had adverse side effects C/o lower extremity weakness, some fear of falling, no falls at this time, will benefit from a cane and PT, he has severe spinal stenosis     Review of Systems See HPI Denies recent fever or chills. Denies sinus pressure, nasal congestion, ear pain or sore throat. Denies chest congestion, productive cough or wheezing. Denies chest pains, palpitations and leg swelling Denies abdominal pain, nausea, vomiting,diarrhea or constipation.    Denies joint pain, swelling and limitation in mobility. Denies headaches or  seizures,  Denies skin break down or rash.        Objective:   Physical Exam  Patient alert and  in no cardiopulmonary distress.  HEENT: No facial asymmetry, EOMI, no sinus tenderness,  oropharynx pink and moist.  Neck decreased ROM, no adenopathy.  Chest: Clear to auscultation bilaterally.  CVS: S1, S2 no murmurs, no S3.  ABD: Soft non tender. Bowel sounds normal.  Ext: No edema  MS: decreased  ROM spine, shoulders, hips and knees.  Skin: Intact, no ulcerations or rash noted.  Psych: Good eye contact, normal affect. Memory loss  anxious not depressed appearing.  CNS: CN 2-12 intact, power, tone and sensation diminished in lower extremities.       Assessment & Plan:

## 2012-06-05 NOTE — Assessment & Plan Note (Signed)
Continue current meds 

## 2012-06-05 NOTE — Assessment & Plan Note (Signed)
Followed by hematology locally and in [facility]

## 2012-06-05 NOTE — Assessment & Plan Note (Signed)
Improved symptoms, and pt has discontinued zefgerid stating he is intolerant, will send message to GI. I have encouraged him to keep the follow up appt he has

## 2012-06-05 NOTE — Assessment & Plan Note (Signed)
Reports incrasing fatigue, denies overt heart failure symptoms

## 2012-06-08 ENCOUNTER — Encounter (INDEPENDENT_AMBULATORY_CARE_PROVIDER_SITE_OTHER): Payer: Self-pay | Admitting: Internal Medicine

## 2012-06-08 ENCOUNTER — Ambulatory Visit (INDEPENDENT_AMBULATORY_CARE_PROVIDER_SITE_OTHER): Payer: Medicare Other | Admitting: Internal Medicine

## 2012-06-08 VITALS — BP 104/50 | HR 56 | Temp 98.2°F | Ht 71.0 in | Wt 159.8 lb

## 2012-06-08 DIAGNOSIS — K219 Gastro-esophageal reflux disease without esophagitis: Secondary | ICD-10-CM

## 2012-06-08 NOTE — Progress Notes (Signed)
Subjective:     Patient ID: [name], male   DOB: March 19, 1926, 78 y.o.   MRN: [id number]  HPI [name] presents today for 1 month follow up of mid abdominal pain. He was seen last month for mid abdominal pain of 5-6 months duration. He was placed on Zegrid. He took 2 days worth and stopped. He tell me the medication did not help and when he voided he had a burning sensation.  In 2007 he underwent a colonoscopy by Dr. Jena Gauss;  2007 by Dr. Jena Gauss: IMPRESSION: Some granularity and erosions of the rectum of uncertain  significance biopsied. A long redundant, but otherwise normal-appearing colon.  He tells me he is not having any abdominal pain. He says he continues to have back pain.  Appetite is not the best.  He has lost about 1 pound since his last visit.  Occasionally acid reflux. significance biopsied. A long redundant, but otherwise normal-appearing  Colon.  BM every 3-4 days. No melena or bright red rectal bleeding.  Review of Systems see hpi Current Outpatient Prescriptions  Medication Sig Dispense Refill  . allopurinol (ZYLOPRIM) 300 MG tablet Take 300 mg by mouth daily.      Marland Kitchen amitriptyline (ELAVIL) 25 MG tablet Take 25 mg by mouth at bedtime.      Marland Kitchen amLODipine (NORVASC) 2.5 MG tablet Take 1 tablet (2.5 mg total) by mouth daily.  30 tablet  11  . calcium carbonate 200 MG capsule Take 750 mg by mouth 2 (two) times daily with a meal.      . celecoxib (CELEBREX) 200 MG capsule Take 200 mg by mouth 2 (two) times daily.      . clotrimazole-betamethasone (LOTRISONE) cream Apply topically 2 (two) times daily.  45 g  1  . cyclobenzaprine (FLEXERIL) 10 MG tablet Take 10 mg by mouth at bedtime.        . dorzolamide-timolol (COSOPT) 22.3-6.8 MG/ML ophthalmic solution       . fentaNYL (DURAGESIC - DOSED MCG/HR) 50 MCG/HR Place 1 patch onto the skin every 3 (three) days.      Marland Kitchen latanoprost (XALATAN) 0.005 % ophthalmic solution       . Meth-Hyo-M Bl-Na Phos-Ph Sal (URETRON D/S) TABS Take 81.6 each by mouth  every 6 (six) hours as needed. Take one tablet by mouth three times a day      . rivastigmine (EXELON) 9.5 mg/24hr Place 1 patch (9.5 mg total) onto the skin daily.  90 patch  1  . Tamsulosin HCl (FLOMAX) 0.4 MG CAPS Take by mouth.      . triamterene-hydrochlorothiazide (MAXZIDE-25) 37.5-25 MG per tablet Take 1 tablet by mouth daily.       Past Medical History  Diagnosis Date  . Chronic systolic heart failure   . Mitral regurgitation   . Bradycardia   . Syncope   . Screening for lipoid disorders   . Fatigue   . Rash and other nonspecific skin eruption   . Costochondritis, acute   . Glaucoma   . Erectile dysfunction   . Visual changes   . Cancer   . Melanoma in situ 2011  . Pancytopenia   . Pancytopenia    Past Surgical History  Procedure Date  . Back surgery     x3 most recent   . 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 Interstim  placed for intestitial cystitis   . Tonsillectomy around 1950  . Surgery to left elbow 2002  . Cataract extraction, bilateral left in  1997 right in 2003  . Pacemaker insertion 2010  . Left heel (cancerous area removed) Jan 2012   History   Social History  . Marital Status: Married    Spouse Name: N/A    Number of Children: N/A  . Years of Education: N/A   Occupational History  . retired     Social History Main Topics  . Smoking status: Former Games developer  . Smokeless tobacco: Not on file  . Alcohol Use: No  . Drug Use: No  . Sexually Active: Not on file   Other Topics Concern  . Not on file   Social History Narrative  . No narrative on file   Family Status  Relation Status Death Age  . Mother Deceased     Alzheimer  . Father Deceased     Throat cancer  . Sister Deceased    Allergies  Allergen Reactions  . Codeine         Objective:   Physical Exam Filed Vitals:   06/08/12 1610  BP: 104/50  Pulse: 56  Temp: 98.2 F (36.8 C)  Height: 5\' 11"  (1.803 m)  Weight: 159 lb 12.8 oz (72.485 kg)  Alert and oriented.  Skin warm and dry. Oral mucosa is moist.   . Sclera anicteric, conjunctivae is pink. Thyroid not enlarged. No cervical lymphadenopathy. Lungs clear. Heart regular rate and rhythm.  Abdomen is soft. Bowel sounds are positive. No hepatomegaly. No abdominal masses felt. No tenderness.  No edema to lower extremities.        Assessment:    Abdominal pain which has now resolved. Suspect GERD.  No acid reflux at this time.    Plan:    Continue present medications. If pain returns will consider an EGD with Dr. Karilyn Cota.

## 2012-06-08 NOTE — Patient Instructions (Addendum)
Continue present medication. If pain returns, will need OV.  OV prn basis.

## 2012-06-12 ENCOUNTER — Telehealth: Payer: Self-pay | Admitting: Family Medicine

## 2012-06-12 ENCOUNTER — Ambulatory Visit (HOSPITAL_COMMUNITY)
Admission: RE | Admit: 2012-06-12 | Discharge: 2012-06-12 | Disposition: A | Discharge status: Home / Self Care | Payer: Medicare Other | Source: Ambulatory Visit | Attending: Family Medicine | Admitting: Family Medicine

## 2012-06-12 ENCOUNTER — Other Ambulatory Visit: Payer: Self-pay

## 2012-06-12 DIAGNOSIS — IMO0001 Reserved for inherently not codable concepts without codable children: Secondary | ICD-10-CM | POA: Diagnosis not present

## 2012-06-12 DIAGNOSIS — M6281 Muscle weakness (generalized): Secondary | ICD-10-CM | POA: Insufficient documentation

## 2012-06-12 DIAGNOSIS — R262 Difficulty in walking, not elsewhere classified: Secondary | ICD-10-CM | POA: Diagnosis not present

## 2012-06-12 DIAGNOSIS — M79609 Pain in unspecified limb: Secondary | ICD-10-CM | POA: Insufficient documentation

## 2012-06-12 DIAGNOSIS — M545 Low back pain, unspecified: Secondary | ICD-10-CM | POA: Insufficient documentation

## 2012-06-12 DIAGNOSIS — R269 Unspecified abnormalities of gait and mobility: Secondary | ICD-10-CM | POA: Insufficient documentation

## 2012-06-12 DIAGNOSIS — M159 Polyosteoarthritis, unspecified: Secondary | ICD-10-CM

## 2012-06-12 MED ORDER — FENTANYL 50 MCG/HR TD PT72: 1.0000 | MEDICATED_PATCH | TRANSDERMAL | Status: DC

## 2012-06-12 NOTE — Telephone Encounter (Signed)
Narc printed and awaiting pickup

## 2012-06-12 NOTE — Evaluation (Addendum)
Physical Therapy Evaluation  Patient Details  Name: [name] MRN: [id number] Date of Birth: 1926/08/29  Today's Date: 06/12/2012 Time:  1030- 1110    Visit#:  1 of    Re-eval:  07/10/2012 Assessment Diagnosis: LE weakness Prior Therapy: none  Authorization:   Medicare Authorization Time Period:    Authorization Visit#:   of     Past Medical History:  Past Medical History  Diagnosis Date  . Chronic systolic heart failure   . Mitral regurgitation   . Bradycardia   . Syncope   . Screening for lipoid disorders   . Fatigue   . Rash and other nonspecific skin eruption   . Costochondritis, acute   . Glaucoma   . Erectile dysfunction   . Visual changes   . Cancer   . Melanoma in situ 2011  . Pancytopenia   . Pancytopenia    Past Surgical History:  Past Surgical History  Procedure Date  . Back surgery     x3 most recent   . 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 Interstim  placed for intestitial cystitis   . Tonsillectomy around 1950  . Surgery to left elbow 2002  . Cataract extraction, bilateral left in 1997 right in 2003  . Pacemaker insertion 2010  . Left heel (cancerous area removed) Jan 2012    Subjective Symptoms/Limitations Symptoms: Mr. Holzer states that his legs have been getting progressively weak for the past several months.  He is unable to tell if one is worse than the other.  The patient states that due to his weakness he sought medical advise who referred him to therapy.   Pertinent History: The patient states that he has had three back surgeries by three different MD's.  How long can you sit comfortably?: The patient states that he has no difficulty sitting. How long can you stand comfortably?: The patient is able to stand for about five minutes before he wants to sit down. How long can you walk comfortably?: The patient is able to walk for 3-5 minutes without needing to stop.   Pain Assessment Currently in Pain?: Yes Pain Score:   2 Pain  Location: Back Pain Orientation: Lower Pain Type: Chronic pain Pain Radiating Towards: B LE to calf  Precautions/Restrictions  Precautions Precautions: Fall  Prior Functioning  Home Living Lives With: Spouse Type of Home: House Home Access: Stairs to enter [employer] of Steps: 2 Entrance Stairs-Rails: Right Prior Function Vocation: Retired Leisure: Hobbies-yes (Comment) Comments:  (fishing but has not fished due to his leg weakness)  Cognition/Observation Cognition Overall Cognitive Status: Appears within functional limits for tasks assessed  Sensation/Coordination/Flexibility/Functional Tests Functional Tests Functional Tests: LEFS 39/64 taking out running and hopping  Assessment RLE Strength Right Hip Flexion: 2/5 Right Hip Extension: 2-/5 Right Hip ABduction: 2-/5 Right Hip ADduction: 2-/5 Right Knee Flexion: 3/5 Right Knee Extension: 3+/5 Right Ankle Dorsiflexion: 3/5 Right Ankle Plantar Flexion: 2/5 LLE Strength Left Hip Flexion: 2+/5 Left Hip Extension: 2/5 Left Hip ABduction: 2/5 Left Hip ADduction: 2-/5 Left Knee Flexion: 3/5 Left Knee Extension: 4/5 Left Ankle Dorsiflexion: 3/5 Left Ankle Plantar Flexion: 2/5  Exercise/Treatments    Seated Long Arc Quad on Chair: Both;10 reps Other Seated Lumbar Exercises: DF/PF Supine Ab Set: 10 reps Bent Knee Raise: 10 reps Bridge: 10 reps Other Supine Lumbar Exercises: hip adduction isometric x 10   Physical Therapy Assessment and Plan     Goals Home Exercise Program Pt will Perform Home Exercise Program: Independently  PT Short Term Goals Time to Complete Short Term Goals: 2 weeks PT Short Term Goal 1: improve MM strength by 1/2 grade PT Short Term Goal 2: Pt able to be able to stand for 10 minutes PT Short Term Goal 3:  Pt to be able to walk for 10 minutes  Long term Goal 1.  MM strength increased by 1 one grade 2.  Pt to be able to walk for 20 minutes  3.  LEFS to be increased by  10 Pt has decreased LE strength, decreased balance and difficulty walking.  Pt will benefit from skilled PT to improve his functional mobility. Pt to be seen for stability, LE strengthening and balance exercises. Two times a week for four weeks  Problem List Patient Active Problem List  Diagnosis  . Pancytopenia  . ERECTILE DYSFUNCTION  . GLAUCOMA  . CENTRAL HEARING LOSS  . MITRAL REGURGITATION, MODERATE  . HYPERTENSION  . CHRONIC SYSTOLIC HEART FAILURE  . INTERSTITIAL CYSTITIS  . GEN OSTEOARTHROSIS INVOLVING MULTIPLE SITES  . BACK PAIN  . SYNCOPE  . FATIGUE  . CARDIAC PACEMAKER IN SITU  . Malignant melanoma  . Alzheimer's dementia  . Dermatomycosis  . Change in bowel habits  . Back pain, chronic  . GERD (gastroesophageal reflux disease)  . Lower extremity weakness       GP  [id number] CK [id number] CI  Ginny Loomer,CINDY 06/12/2012, 1:01 PM  Physician Documentation Your signature is required to indicate approval of the treatment plan as stated above.  Please sign and either send electronically or make a copy of this report for your files and return this physician signed original.   Please mark one 1.__approve of plan  2. ___approve of plan with the following conditions.   ______________________________                                                          _____________________ Physician Signature                                                                                                             Date

## 2012-06-15 ENCOUNTER — Ambulatory Visit (HOSPITAL_COMMUNITY)
Admission: RE | Admit: 2012-06-15 | Discharge: 2012-06-15 | Disposition: A | Discharge status: Home / Self Care | Payer: Medicare Other | Source: Ambulatory Visit | Attending: Family Medicine | Admitting: Family Medicine

## 2012-06-15 NOTE — Progress Notes (Signed)
Physical Therapy Treatment Patient Details  Name: [name] MRN: [id number] Date of Birth: [birth year]  Today's Date: 06/15/2012 Time: 9629-5284 PT Time Calculation (min): 42 min Visit#: 2  of 12   Re-eval: 07/12/12 Charges:  therex 40'    Authorization: Medicare  Authorization Visit#: 2  of 10    Subjective: Symptoms/Limitations Symptoms: Pt. states he is trying to walk more/do more at home.  States he was not sore after last treatment.   Exercise/Treatments Aerobic Stationary Bike: 6' @ 3.0 fopr exercise tolerance seat 10 Seated Long Arc Quad on Chair: 10 reps;Both;Limitations LAQ on Chair Limitations: 5"holds Hip Flexion on Ball: 10 reps;Both;Limitations Hip Flexion on Ball Limitations: without ball Sit to Stand: 5 reps Other Seated Lumbar Exercises: DF/PF 10 reps each Supine Ab Set: 10 reps Clam: 10 reps Bent Knee Raise: 10 reps Bridge: 10 reps Straight Leg Raise: 5 reps;Limitations Straight Leg Raises Limitations: 2 sets Other Supine Lumbar Exercises: hip adduction isometric x 10     Physical Therapy Assessment and Plan PT Assessment and Plan Clinical Impression Statement: Added new exercises without diffiuculty.  Required multimodal cues for posture and form.  Pt. with noted weakness with SLR with L being weaker, extension lag and poor eccentric control. PT Plan: begin heel raise, functional squat, standing hip abduction, extension; SLS next treatment per PT POC.     Problem List Patient Active Problem List  Diagnosis  . Pancytopenia  . ERECTILE DYSFUNCTION  . GLAUCOMA  . CENTRAL HEARING LOSS  . MITRAL REGURGITATION, MODERATE  . HYPERTENSION  . CHRONIC SYSTOLIC HEART FAILURE  . INTERSTITIAL CYSTITIS  . GEN OSTEOARTHROSIS INVOLVING MULTIPLE SITES  . BACK PAIN  . SYNCOPE  . FATIGUE  . CARDIAC PACEMAKER IN SITU  . Malignant melanoma  . Alzheimer's dementia  . Dermatomycosis  . Change in bowel habits  . Back pain, chronic  . GERD (gastroesophageal  reflux disease)  . Lower extremity weakness  . Difficulty walking  . Difficulty in walking  . Abnormal gait    Lurena Nida, PTA/CLT 06/15/2012, 4:01 PM

## 2012-06-20 ENCOUNTER — Ambulatory Visit (HOSPITAL_COMMUNITY)
Admission: RE | Admit: 2012-06-20 | Discharge: 2012-06-20 | Disposition: A | Discharge status: Home / Self Care | Payer: Medicare Other | Source: Ambulatory Visit | Attending: Family Medicine | Admitting: Family Medicine

## 2012-06-20 NOTE — Progress Notes (Signed)
Physical Therapy Treatment Patient Details  Name: [name] MRN: [id number] Date of Birth: 18-Jan-1926  Today's Date: 06/20/2012 Time: 4098-1191 PT Time Calculation (min): 50 min  Visit#: 3  of 12   Re-eval: 07/12/12 Charges: Therex x 40'  Authorization: Medicare  Authorization Visit#: 3  of 10    Subjective: Symptoms/Limitations Symptoms: Pt reports that he is completing his HEP every other day. Pain Assessment Currently in Pain?: No/denies Pain Score: 0-No pain   Exercise/Treatments Aerobic Stationary Bike: 6' @ 3.0 fopr exercise tolerance seat 10 Standing Heel Raises: 10 reps;Limitations Heel Raises Limitations: Toe raises x 10 with 1/2 ROM Functional Squats: 10 reps Other Standing Lumbar Exercises: Standing hip ext/abd B x 10 Other Standing Lumbar Exercises: SLS 1" B max of 5 Seated Long Arc Quad on Chair: 10 reps;Both;Limitations LAQ on Chair Limitations: 5"holds Hip Flexion on Ball: 10 reps;Both;Limitations Hip Flexion on Ball Limitations: without ball Sit to Stand: 5 reps;Limitations Sit to Stand Limitations: w/o UE assistance Other Seated Lumbar Exercises: DF 10 reps each Supine Ab Set: 10 reps Bent Knee Raise: 10 reps Bridge: 15 reps Straight Leg Raise: 5 reps;Limitations Straight Leg Raises Limitations: 2 sets Other Supine Lumbar Exercises: hip adduction isometric 10x5" Sidelying Clam: 5 reps;Limitations Clam Limitations: 10" holds Hip Abduction: 10 reps (AA ROMon R; AROM on L)  Physical Therapy Assessment and Plan PT Assessment and Plan Clinical Impression Statement: Pt requires multimodal cueing for form with functional squats and standing hip ext/abd. Pt tolerates standing therex well. Pt requires assistance with R SL abduction. Pt reports 0/10 pain at end of session. PT Plan: Continue to progress per PT POC.     Problem List Patient Active Problem List  Diagnosis  . Pancytopenia  . ERECTILE DYSFUNCTION  . GLAUCOMA  . CENTRAL HEARING LOSS    . MITRAL REGURGITATION, MODERATE  . HYPERTENSION  . CHRONIC SYSTOLIC HEART FAILURE  . INTERSTITIAL CYSTITIS  . GEN OSTEOARTHROSIS INVOLVING MULTIPLE SITES  . BACK PAIN  . SYNCOPE  . FATIGUE  . CARDIAC PACEMAKER IN SITU  . Malignant melanoma  . Alzheimer's dementia  . Dermatomycosis  . Change in bowel habits  . Back pain, chronic  . GERD (gastroesophageal reflux disease)  . Lower extremity weakness  . Difficulty walking  . Difficulty in walking  . Abnormal gait    PT - End of Session Activity Tolerance: Patient tolerated treatment well General Behavior During Session: [facility] for tasks performed Cognition: [facility] for tasks performed   Seth Bake, PTA 06/20/2012, 12:02 PM

## 2012-06-21 ENCOUNTER — Ambulatory Visit (INDEPENDENT_AMBULATORY_CARE_PROVIDER_SITE_OTHER): Payer: Medicare Other

## 2012-06-21 DIAGNOSIS — Z23 Encounter for immunization: Secondary | ICD-10-CM

## 2012-06-22 ENCOUNTER — Ambulatory Visit (HOSPITAL_COMMUNITY): Admission: RE | Admit: 2012-06-22 | Payer: Medicare Other | Source: Ambulatory Visit

## 2012-06-26 DIAGNOSIS — R351 Nocturia: Secondary | ICD-10-CM | POA: Diagnosis not present

## 2012-06-26 DIAGNOSIS — R300 Dysuria: Secondary | ICD-10-CM | POA: Diagnosis not present

## 2012-06-26 DIAGNOSIS — R350 Frequency of micturition: Secondary | ICD-10-CM | POA: Diagnosis not present

## 2012-06-27 ENCOUNTER — Ambulatory Visit (HOSPITAL_COMMUNITY)
Admission: RE | Admit: 2012-06-27 | Discharge: 2012-06-27 | Disposition: A | Discharge status: Home / Self Care | Payer: Medicare Other | Source: Ambulatory Visit | Attending: Family Medicine | Admitting: Family Medicine

## 2012-06-27 NOTE — Progress Notes (Signed)
Physical Therapy Treatment Patient Details  Name: [name] MRN: [id number] Date of Birth: 02/14/1926  Today's Date: 06/27/2012 Time: 4098-1191 PT Time Calculation (min): 50 min Charges:  therex 57' Visit#: 4  of 12   Re-eval: 07/12/12    Authorization: Medicare  Authorization Time Period:    Authorization Visit#: 4  of 10    Subjective: Symptoms/Limitations Symptoms: Pt. states he is not having any pain. Doing well overall. Pain Assessment Currently in Pain?: No/denies   Exercise/Treatments Aerobic Stationary Bike: 8' @ 3.0 fopr exercise tolerance seat 10 Standing Heel Raises: 15 reps Heel Raises Limitations: Toe raises x 15 with 1/2 ROM Functional Squats: 10 reps Other Standing Lumbar Exercises: Standing hip ext/abd B x 10 Other Standing Lumbar Exercises: SLS 1-3" B max of 5 Seated Long Arc Quad on Chair: 15 reps LAQ on Chair Limitations: 5"holds Hip Flexion on Ball: 10 reps Hip Flexion on Ball Limitations: without ball Sit to Stand: 5 reps;Limitations Sit to Stand Limitations: w/o UE assistance Other Seated Lumbar Exercises: DF 15 reps each Supine Bridge: 15 reps Straight Leg Raise: 5 reps;Limitations Straight Leg Raises Limitations: 2 sets Other Supine Lumbar Exercises: hip adduction isometric 15x5" Sidelying Clam: 5 reps;10 reps Hip Abduction: 10 reps;Limitations Hip Abduction Limitations: with AA on R only    Physical Therapy Assessment and Plan PT Assessment and Plan Clinical Impression Statement: Pt. required less assistance today with side lying abduction and decreased extension lag with SLR's.  Noted improvement in strength and overall function. PT Plan: Continue per POC; progress activity tolerance and function.      Problem List Patient Active Problem List  Diagnosis  . Pancytopenia  . ERECTILE DYSFUNCTION  . GLAUCOMA  . CENTRAL HEARING LOSS  . MITRAL REGURGITATION, MODERATE  . HYPERTENSION  . CHRONIC SYSTOLIC HEART FAILURE  .  INTERSTITIAL CYSTITIS  . GEN OSTEOARTHROSIS INVOLVING MULTIPLE SITES  . BACK PAIN  . SYNCOPE  . FATIGUE  . CARDIAC PACEMAKER IN SITU  . Malignant melanoma  . Alzheimer's dementia  . Dermatomycosis  . Change in bowel habits  . Back pain, chronic  . GERD (gastroesophageal reflux disease)  . Lower extremity weakness  . Difficulty walking  . Difficulty in walking  . Abnormal gait    PT - End of Session Activity Tolerance: Patient tolerated treatment well General Behavior During Session: [facility] for tasks performed Cognition: [facility] for tasks performed   Lurena Nida, PTA/CLT 06/27/2012, 11:11 AM

## 2012-06-29 ENCOUNTER — Ambulatory Visit (HOSPITAL_COMMUNITY)
Admission: RE | Admit: 2012-06-29 | Discharge: 2012-06-29 | Disposition: A | Discharge status: Home / Self Care | Payer: Medicare Other | Source: Ambulatory Visit | Attending: Family Medicine | Admitting: Family Medicine

## 2012-06-29 DIAGNOSIS — R269 Unspecified abnormalities of gait and mobility: Secondary | ICD-10-CM

## 2012-06-29 DIAGNOSIS — R262 Difficulty in walking, not elsewhere classified: Secondary | ICD-10-CM

## 2012-06-29 NOTE — Progress Notes (Signed)
Physical Therapy Treatment Patient Details  Name: [name] MRN: [id number] Date of Birth: 08-Jun-1926  Today's Date: 06/29/2012 Time: 1012-1050 PT Time Calculation (min): 38 min  Visit#: 5  of 12   Re-eval: 07/12/12  Charge: therex 38'  Authorization: Medicare  Authorization Time Period:    Authorization Visit#: 5  of 10    Subjective: Symptoms/Limitations Symptoms: Pt reported chronic LBP 8/10 Pain Assessment Currently in Pain?: Yes Pain Score:   8 Pain Location: Back Pain Orientation: Lower  Objective:   Exercise/Treatments Aerobic Stationary Bike: 8' @ 3.5 for exercise tolerance seat 10 Standing Heel Raises: 15 reps Heel Raises Limitations: Toe raises x 15 with 1/2 ROM Other Standing Lumbar Exercises: SLS 1-3" B max of 5 Supine Bridge: 15 reps;Limitations Bridge Limitations: with ball between knees Straight Leg Raise: 15 reps Other Supine Lumbar Exercises: SAQ 15x BLE  Physical Therapy Assessment and Plan PT Assessment and Plan Clinical Impression Statement: Pt limited by LBP today, modified exercises and offered MHP to help reduce pain.  Pt repoorted exercises increase his back pain and asked to be done early.  Pt stated non-compliance with HEP due to pain.  Pt educated on benefits of exercises for core and LE strengthening for stability to help reduce pain.  PT Plan: Continue per POC; progress activity tolerance and function    Goals    Problem List Patient Active Problem List  Diagnosis  . Pancytopenia  . ERECTILE DYSFUNCTION  . GLAUCOMA  . CENTRAL HEARING LOSS  . MITRAL REGURGITATION, MODERATE  . HYPERTENSION  . CHRONIC SYSTOLIC HEART FAILURE  . INTERSTITIAL CYSTITIS  . GEN OSTEOARTHROSIS INVOLVING MULTIPLE SITES  . BACK PAIN  . SYNCOPE  . FATIGUE  . CARDIAC PACEMAKER IN SITU  . Malignant melanoma  . Alzheimer's dementia  . Dermatomycosis  . Change in bowel habits  . Back pain, chronic  . GERD (gastroesophageal reflux disease)  . Lower  extremity weakness  . Difficulty walking  . Difficulty in walking  . Abnormal gait    PT - End of Session Equipment Utilized During Treatment: Gait belt Activity Tolerance: Patient limited by pain General Behavior During Session: [facility] for tasks performed Cognition: [facility] for tasks performed  GP    Juel Burrow 06/29/2012, 11:04 AM

## 2012-07-04 ENCOUNTER — Ambulatory Visit (HOSPITAL_COMMUNITY): Payer: Medicare Other | Admitting: Physical Therapy

## 2012-07-06 ENCOUNTER — Ambulatory Visit (HOSPITAL_COMMUNITY): Payer: Medicare Other | Admitting: Physical Therapy

## 2012-07-10 ENCOUNTER — Ambulatory Visit (INDEPENDENT_AMBULATORY_CARE_PROVIDER_SITE_OTHER): Payer: Medicare Other | Admitting: Family Medicine

## 2012-07-10 ENCOUNTER — Encounter: Payer: Self-pay | Admitting: Family Medicine

## 2012-07-10 VITALS — BP 114/80 | HR 61 | Resp 15 | Ht 68.0 in | Wt 159.8 lb

## 2012-07-10 DIAGNOSIS — G309 Alzheimer's disease, unspecified: Secondary | ICD-10-CM

## 2012-07-10 DIAGNOSIS — G8929 Other chronic pain: Secondary | ICD-10-CM

## 2012-07-10 DIAGNOSIS — F0280 Dementia in other diseases classified elsewhere without behavioral disturbance: Secondary | ICD-10-CM | POA: Diagnosis not present

## 2012-07-10 DIAGNOSIS — H409 Unspecified glaucoma: Secondary | ICD-10-CM

## 2012-07-10 DIAGNOSIS — M549 Dorsalgia, unspecified: Secondary | ICD-10-CM | POA: Diagnosis not present

## 2012-07-10 DIAGNOSIS — N3010 Interstitial cystitis (chronic) without hematuria: Secondary | ICD-10-CM

## 2012-07-10 DIAGNOSIS — R262 Difficulty in walking, not elsewhere classified: Secondary | ICD-10-CM

## 2012-07-10 DIAGNOSIS — K219 Gastro-esophageal reflux disease without esophagitis: Secondary | ICD-10-CM

## 2012-07-10 DIAGNOSIS — R351 Nocturia: Secondary | ICD-10-CM | POA: Insufficient documentation

## 2012-07-10 MED ORDER — IMIPRAMINE HCL 10 MG PO TABS: 10.0000 mg | ORAL_TABLET | Freq: Every day | ORAL | Status: DC

## 2012-07-10 NOTE — Assessment & Plan Note (Signed)
On 2 meds, and still c/o nocturia, will add imipramine in trial. Plans dietary modification, states no dairy or nuts is helping alot

## 2012-07-10 NOTE — Patient Instructions (Addendum)
Annual wellness in December.  One new medication, to take at bedtime to help with frequent urination as needed.  Please continue to stay off ice cream , dairy, nuts, cookies since you feel better  Med list is presented

## 2012-07-10 NOTE — Progress Notes (Signed)
  Subjective:    Patient ID: [name], male    DOB: September 28, 1926, 78 y.o.   MRN: [id number]  HPI The PT is here for follow up and re-evaluation of chronic medical conditions, medication management and review of any available recent lab and radiology data.  Preventive health is updated, specifically  Cancer screening and Immunization.   Questions or concerns regarding consultations or procedures which the PT has had in the interim are  Addressed.Did well with physical therapy.c/o nocturia as his only complaint. Just saw his urologist.Has stopped patch for dementia The PT denies any adverse reactions to current medications since the last visit.        Review of Systems See HPI Denies recent fever or chills. Denies sinus pressure, nasal congestion, ear pain or sore throat. Denies chest congestion, productive cough or wheezing. Denies chest pains, palpitations and leg swelling Denies abdominal pain, nausea, vomiting,diarrhea or constipation.     Denies headaches, seizures, numbness, or tingling. Denies depression, anxiety or insomnia. Denies skin break down or rash.        Objective:   Physical Exam  Patient alert and oriented and in no cardiopulmonary distress.  HEENT: No facial asymmetry, EOMI, no sinus tenderness,  oropharynx pink and moist.  Neck adequate ROM, no adenopathy.  Chest: Clear to auscultation bilaterally.  CVS: S1, S2 no murmurs, no S3.  ABD: Soft non tender. Bowel sounds normal.  Ext: No edema  MS: decreased  ROM spine, shoulders, hips and knees.  Skin: Intact, no ulcerations or rash noted.  Psych: Good eye contact, normal affect. Memory loss not anxious or depressed appearing.  CNS: CN 2-12 intact,decreased hearing and vision power, tone and sensation normal throughout.       Assessment & Plan:

## 2012-07-10 NOTE — Assessment & Plan Note (Signed)
Currently refusing patch will re address at a later stage

## 2012-07-10 NOTE — Assessment & Plan Note (Signed)
Asymptomatic off meds

## 2012-07-10 NOTE — Assessment & Plan Note (Signed)
Continue eye drops and close opthal surveilance

## 2012-07-10 NOTE — Assessment & Plan Note (Signed)
Improved with physical therapy, also controlled with fentanyl patch continue same

## 2012-07-10 NOTE — Assessment & Plan Note (Signed)
Improved with therapy x 4 sessions, stopped stating his back was hurting more

## 2012-07-13 ENCOUNTER — Telehealth: Payer: Self-pay | Admitting: Family Medicine

## 2012-07-13 ENCOUNTER — Other Ambulatory Visit: Payer: Self-pay

## 2012-07-13 MED ORDER — AMITRIPTYLINE HCL 25 MG PO TABS: ORAL_TABLET | ORAL | Status: DC

## 2012-07-13 NOTE — Telephone Encounter (Signed)
Med sent.

## 2012-08-08 ENCOUNTER — Other Ambulatory Visit: Payer: Self-pay

## 2012-08-08 ENCOUNTER — Telehealth: Payer: Self-pay | Admitting: Family Medicine

## 2012-08-08 DIAGNOSIS — M25529 Pain in unspecified elbow: Secondary | ICD-10-CM | POA: Diagnosis not present

## 2012-08-08 MED ORDER — AMITRIPTYLINE HCL 25 MG PO TABS: ORAL_TABLET | ORAL | Status: DC

## 2012-08-08 NOTE — Telephone Encounter (Signed)
Med refilled.

## 2012-08-16 DIAGNOSIS — C4370 Malignant melanoma of unspecified lower limb, including hip: Secondary | ICD-10-CM | POA: Diagnosis not present

## 2012-08-22 ENCOUNTER — Other Ambulatory Visit: Payer: Self-pay | Admitting: Family Medicine

## 2012-09-05 DIAGNOSIS — M25529 Pain in unspecified elbow: Secondary | ICD-10-CM | POA: Diagnosis not present

## 2012-09-11 DIAGNOSIS — R351 Nocturia: Secondary | ICD-10-CM | POA: Diagnosis not present

## 2012-09-11 DIAGNOSIS — N3010 Interstitial cystitis (chronic) without hematuria: Secondary | ICD-10-CM | POA: Diagnosis not present

## 2012-10-12 ENCOUNTER — Encounter: Payer: Self-pay | Admitting: Family Medicine

## 2012-10-12 ENCOUNTER — Ambulatory Visit (INDEPENDENT_AMBULATORY_CARE_PROVIDER_SITE_OTHER): Payer: 59 | Admitting: Family Medicine

## 2012-10-12 VITALS — BP 142/84 | HR 71 | Resp 16 | Ht 68.0 in | Wt 163.0 lb

## 2012-10-12 DIAGNOSIS — Z23 Encounter for immunization: Secondary | ICD-10-CM

## 2012-10-12 DIAGNOSIS — S61219A Laceration without foreign body of unspecified finger without damage to nail, initial encounter: Secondary | ICD-10-CM | POA: Insufficient documentation

## 2012-10-12 DIAGNOSIS — M159 Polyosteoarthritis, unspecified: Secondary | ICD-10-CM

## 2012-10-12 DIAGNOSIS — Z0000 Encounter for general adult medical examination without abnormal findings: Secondary | ICD-10-CM

## 2012-10-12 DIAGNOSIS — S61209A Unspecified open wound of unspecified finger without damage to nail, initial encounter: Secondary | ICD-10-CM

## 2012-10-12 MED ORDER — FENTANYL 50 MCG/HR TD PT72: 1.0000 | MEDICATED_PATCH | TRANSDERMAL | Status: DC

## 2012-10-12 NOTE — Patient Instructions (Addendum)
F/U in 3 month, call if you need me before  Full memory evaluation at next visit and completion of advanced directives in writing  No change in current medication.  TDAP today due to laceration on the left index finger, keep area clean and dry and use topical antibiotic ointment sparingly twice daily for the next 5 days  You will be provided with a copy of the MOST form to review with your wife regarding advanced directives

## 2012-10-12 NOTE — Progress Notes (Signed)
Subjective:    Patient ID: [name], male    DOB: 1926-10-01, 79 y.o.   MRN: [id number]  HPI Preventive Screening-Counseling & Management   Patient present here today for a Medicare annual wellness visit. C/o laceration on left index finger which he obtained 4 days before, area healing well, however needs tetanus updated   Current Problems (verified)   Medications Prior to Visit Allergies (verified)   PAST HISTORY  Family History  Social History Married father of 2 sons, quit nicotine 2 0 years, no alcohol or drugs   Risk Factors  Current exercise habits: no regular physical activity, limited in mobility by arthritis, encouraged to do chair exercises   Dietary issues discussed:Diet rich in fruit and vegetable discussed and encouraged   Cardiac risk factors: none significant  Depression Screen  (Note: if answer to either of the following is "Yes", a more complete depression screening is indicated)   Over the past two weeks, have you felt down, depressed or hopeless? No  Over the past two weeks, have you felt little interest or pleasure in doing things? No  Have you lost interest or pleasure in daily life? No  Do you often feel hopeless? No  Do you cry easily over simple problems? No   Activities of Daily Living  In your present state of health, do you have any difficulty performing the following activities?  Driving?: No Managing money?: No Feeding yourself?:No Getting from bed to chair?:yes Climbing a flight of stairs?yes Preparing food and eating?:No Bathing or showering?:No Getting dressed?:No Getting to the toilet?:No Using the toilet?:No Moving around from place to place?: falls often  Fall Risk Assessment In the past year have you fallen or had a near fall?:yes Are you currently taking any medications that make you dizziness?:No   Hearing Difficulties: No Do you often ask people to speak up or repeat themselves?:yes, has hearing aids, does not use  often Do you experience ringing or noises in your ears?:No Do you have difficulty understanding soft or whispered voices?:yes  Cognitive Testing  Alert? Yes Normal Appearance?Yes  Oriented to person? Yes Place? Yes  Time? Yes  Displays appropriate judgment?Yes  Can read the correct time from a watch face? yes Are you having problems remembering things?yes, short term memory   Advanced Directives have been discussed with the patient?Yes , full code   List the Names of Other Physician/Practitioners you currently use: Dr Leo Rod dermatology, [facility] hematology, Dr Mariel Sleet,    Indicate any recent Medical Services you may have received from other than [facility] providers in the past year (date may be approximate).   Assessment:    Annual Wellness Exam   Plan:    During the course of the visit the patient was educated and counseled about appropriate screening and preventive services including:  A healthy diet is rich in fruit, vegetables and whole grains. Poultry fish, nuts and beans are a healthy choice for protein rather then red meat. A low sodium diet and drinking 64 ounces of water daily is generally recommended. Oils and sweet should be limited. Carbohydrates especially for those who are diabetic or overweight, should be limited to 30-45 gram per meal. It is important to eat on a regular schedule, at least 3 times daily. Snacks should be primarily fruits, vegetables or nuts. It is important that you exercise regularly at least 30 minutes 5 times a week. If you develop chest pain, have severe difficulty breathing, or feel very tired, stop exercising immediately and seek medical  attention  Immunization reviewed and updated. Cancer screening reviewed and updated    Patient Instructions (the written plan) was given to the patient.  Medicare Attestation  I have personally reviewed:  The patient's medical and social history  Their use of alcohol, tobacco or illicit drugs  Their  current medications and supplements  The patient's functional ability including ADLs,fall risks, home safety risks, cognitive, and hearing and visual impairment  Diet and physical activities  Evidence for depression or mood disorders  The patient's weight, height, BMI, and visual acuity have been recorded in the chart. I have made referrals, counseling, and provided education to the patient based on review of the above and I have provided the patient with a written personalized care plan for preventive services.      Review of Systems     Objective:   Physical Exam  MS: left index finger has partially open laceration, length approx 2 inches, no purulent drainage      Assessment & Plan:

## 2012-11-01 ENCOUNTER — Telehealth: Payer: Self-pay

## 2012-11-01 NOTE — Telephone Encounter (Signed)
Patient is aware and will come in for nurse visit before 11 on 1/30

## 2012-11-01 NOTE — Telephone Encounter (Signed)
Nurse visit for [facility] , if  Pt feels it is safe to drive , pls verify with him and let him know can come in am as we are closing soon for today

## 2012-11-02 ENCOUNTER — Ambulatory Visit (INDEPENDENT_AMBULATORY_CARE_PROVIDER_SITE_OTHER): Payer: Medicare Other

## 2012-11-02 VITALS — BP 126/74 | Wt 162.1 lb

## 2012-11-02 DIAGNOSIS — N3000 Acute cystitis without hematuria: Secondary | ICD-10-CM

## 2012-11-03 ENCOUNTER — Ambulatory Visit (INDEPENDENT_AMBULATORY_CARE_PROVIDER_SITE_OTHER): Payer: Medicare Other | Admitting: Urology

## 2012-11-03 DIAGNOSIS — R351 Nocturia: Secondary | ICD-10-CM

## 2012-11-03 DIAGNOSIS — R339 Retention of urine, unspecified: Secondary | ICD-10-CM

## 2012-11-03 DIAGNOSIS — N3010 Interstitial cystitis (chronic) without hematuria: Secondary | ICD-10-CM

## 2012-11-03 LAB — POCT URINALYSIS DIPSTICK
Blood, UA: NEGATIVE
Nitrite, UA: NEGATIVE
Urobilinogen, UA: 4
pH, UA: 7

## 2012-11-03 NOTE — Progress Notes (Signed)
Patient in for nurse visit to have urine checked.  Complaints of mal odor to burning with urination.  UA negative for infection.  Physician made aware.  Patient advised to contact urologist for followup.

## 2012-11-06 DIAGNOSIS — Z0000 Encounter for general adult medical examination without abnormal findings: Secondary | ICD-10-CM | POA: Insufficient documentation

## 2012-11-06 NOTE — Assessment & Plan Note (Signed)
Annual wellness completed as documented. Pt needs more complete memory eval at next visit, even though the mini cog was normal, he has been on medication for memory. He is to review the MOST from with his children, he is a full code, but may wish modification of treatment offered. Balance is n issue

## 2012-11-06 NOTE — Assessment & Plan Note (Signed)
Open wound on finger no obvious infection, topical antibiotic, and area to be kept clean Tdap at visit, due

## 2012-11-24 ENCOUNTER — Ambulatory Visit (INDEPENDENT_AMBULATORY_CARE_PROVIDER_SITE_OTHER): Payer: Medicare Other | Admitting: Urology

## 2012-11-24 DIAGNOSIS — N3644 Muscular disorders of urethra: Secondary | ICD-10-CM

## 2012-11-24 DIAGNOSIS — N3010 Interstitial cystitis (chronic) without hematuria: Secondary | ICD-10-CM

## 2012-11-24 DIAGNOSIS — N411 Chronic prostatitis: Secondary | ICD-10-CM | POA: Diagnosis not present

## 2012-11-27 ENCOUNTER — Ambulatory Visit (INDEPENDENT_AMBULATORY_CARE_PROVIDER_SITE_OTHER): Payer: Medicare Other | Admitting: *Deleted

## 2012-11-27 ENCOUNTER — Other Ambulatory Visit: Payer: Self-pay | Admitting: Internal Medicine

## 2012-11-27 DIAGNOSIS — I5022 Chronic systolic (congestive) heart failure: Secondary | ICD-10-CM

## 2012-11-27 DIAGNOSIS — I442 Atrioventricular block, complete: Secondary | ICD-10-CM

## 2012-11-27 LAB — PACEMAKER DEVICE OBSERVATION
AL IMPEDENCE PM: 337 Ohm
ATRIAL PACING PM: 51
BAMS-0001: 150 {beats}/min
RV LEAD IMPEDENCE PM: 441 Ohm
RV LEAD THRESHOLD: 1 V
VENTRICULAR PACING PM: 100

## 2012-11-27 NOTE — Progress Notes (Signed)
PPM check 

## 2012-12-06 DIAGNOSIS — M25529 Pain in unspecified elbow: Secondary | ICD-10-CM | POA: Diagnosis not present

## 2012-12-07 DIAGNOSIS — H26499 Other secondary cataract, unspecified eye: Secondary | ICD-10-CM | POA: Diagnosis not present

## 2012-12-07 DIAGNOSIS — H4011X0 Primary open-angle glaucoma, stage unspecified: Secondary | ICD-10-CM | POA: Diagnosis not present

## 2012-12-07 DIAGNOSIS — Z961 Presence of intraocular lens: Secondary | ICD-10-CM | POA: Diagnosis not present

## 2012-12-14 ENCOUNTER — Encounter: Payer: Self-pay | Admitting: Internal Medicine

## 2013-01-10 ENCOUNTER — Encounter: Payer: Self-pay | Admitting: Family Medicine

## 2013-01-10 ENCOUNTER — Ambulatory Visit (INDEPENDENT_AMBULATORY_CARE_PROVIDER_SITE_OTHER): Payer: Medicare Other | Admitting: Family Medicine

## 2013-01-10 VITALS — BP 152/82 | HR 62 | Resp 16 | Ht 68.0 in | Wt 171.4 lb

## 2013-01-10 DIAGNOSIS — I10 Essential (primary) hypertension: Secondary | ICD-10-CM

## 2013-01-10 DIAGNOSIS — F0280 Dementia in other diseases classified elsewhere without behavioral disturbance: Secondary | ICD-10-CM | POA: Diagnosis not present

## 2013-01-10 DIAGNOSIS — N3010 Interstitial cystitis (chronic) without hematuria: Secondary | ICD-10-CM

## 2013-01-10 DIAGNOSIS — H409 Unspecified glaucoma: Secondary | ICD-10-CM | POA: Diagnosis not present

## 2013-01-10 DIAGNOSIS — M549 Dorsalgia, unspecified: Secondary | ICD-10-CM

## 2013-01-10 DIAGNOSIS — G8929 Other chronic pain: Secondary | ICD-10-CM

## 2013-01-10 DIAGNOSIS — R262 Difficulty in walking, not elsewhere classified: Secondary | ICD-10-CM | POA: Diagnosis not present

## 2013-01-10 DIAGNOSIS — G309 Alzheimer's disease, unspecified: Secondary | ICD-10-CM | POA: Diagnosis not present

## 2013-01-10 MED ORDER — AMLODIPINE BESYLATE 2.5 MG PO TABS: 2.5000 mg | ORAL_TABLET | Freq: Every day | ORAL | Status: DC

## 2013-01-10 NOTE — Progress Notes (Signed)
  Subjective:    Patient ID: [name], male    DOB: 1926/06/26, 79 y.o.   MRN: [id number]  HPI The PT is here for follow up and re-evaluation of chronic medical conditions, medication management and review of any available recent lab and radiology data.  Preventive health is updated, specifically  Cancer screening and Immunization.   Questions or concerns regarding consultations or procedures which the PT has had in the interim are  addressed. The PT denies any adverse reactions to current medications since the last visit. States current meds he is on are doing him well, and he wants to make no changes There are no new concerns.  There are no specific complaints       Review of Systems See HPI Denies recent fever or chills. Denies sinus pressure, nasal congestion, ear pain or sore throat. Denies chest congestion, productive cough or wheezing. Denies chest pains, palpitations and leg swelling Denies abdominal pain, nausea, vomiting,diarrhea or constipation.    Denies uncontrolled  joint pain, swelling and does have  limitation in mobility. Denies headaches, seizures, numbness, or tingling. Denies depression, anxiety or insomnia. Denies skin break down or rash.        Objective:   Physical Exam  Patient alert and oriented and in no cardiopulmonary distress.  HEENT: No facial asymmetry, EOMI, no sinus tenderness,  oropharynx pink and moist.  Neck decreased ROM no adenopathy.  Chest: Clear to auscultation bilaterally.  CVS: S1, S2 no murmurs, no S3.  ABD: Soft non tender. Bowel sounds normal.  Ext: No edema  MS: decreased ROM spine, shoulders, hips and knees.  Skin: Intact, no ulcerations or rash noted.  Psych: Good eye contact, normal affect. Memory loss not anxious or depressed appearing.  CNS: CN 2-12 intact, power,  normal throughout.       Assessment & Plan:

## 2013-01-10 NOTE — Patient Instructions (Addendum)
F/u end September, call if you need me before  No change in medication  Bilateral ear flush today not successful as wax is too hard, please use softener prior to next visit

## 2013-01-13 NOTE — Assessment & Plan Note (Signed)
Non compliant with medication which he has discontinued, encourage hm to re consider this decision

## 2013-01-13 NOTE — Assessment & Plan Note (Signed)
Followed closely by opthalmologist

## 2013-01-13 NOTE — Assessment & Plan Note (Signed)
Improved control with celebrex only

## 2013-01-13 NOTE — Assessment & Plan Note (Signed)
Improved with therapy 

## 2013-01-13 NOTE — Assessment & Plan Note (Signed)
Adequate control, no med change 

## 2013-01-13 NOTE — Assessment & Plan Note (Signed)
Improved symptoms with valium per urology

## 2013-01-17 DIAGNOSIS — M25519 Pain in unspecified shoulder: Secondary | ICD-10-CM | POA: Diagnosis not present

## 2013-02-09 ENCOUNTER — Ambulatory Visit (INDEPENDENT_AMBULATORY_CARE_PROVIDER_SITE_OTHER): Payer: Medicare Other | Admitting: Urology

## 2013-02-09 DIAGNOSIS — N3010 Interstitial cystitis (chronic) without hematuria: Secondary | ICD-10-CM | POA: Diagnosis not present

## 2013-02-09 DIAGNOSIS — R350 Frequency of micturition: Secondary | ICD-10-CM | POA: Diagnosis not present

## 2013-02-09 DIAGNOSIS — N3644 Muscular disorders of urethra: Secondary | ICD-10-CM

## 2013-02-14 DIAGNOSIS — M25569 Pain in unspecified knee: Secondary | ICD-10-CM | POA: Diagnosis not present

## 2013-02-14 DIAGNOSIS — M25519 Pain in unspecified shoulder: Secondary | ICD-10-CM | POA: Diagnosis not present

## 2013-03-01 ENCOUNTER — Ambulatory Visit (INDEPENDENT_AMBULATORY_CARE_PROVIDER_SITE_OTHER): Payer: Medicare Other | Admitting: Family Medicine

## 2013-03-01 ENCOUNTER — Encounter: Payer: Self-pay | Admitting: Family Medicine

## 2013-03-01 VITALS — BP 136/78 | HR 66 | Temp 98.4°F | Resp 18 | Ht 68.0 in | Wt 167.1 lb

## 2013-03-01 DIAGNOSIS — R5381 Other malaise: Secondary | ICD-10-CM | POA: Diagnosis not present

## 2013-03-01 DIAGNOSIS — J019 Acute sinusitis, unspecified: Secondary | ICD-10-CM

## 2013-03-01 DIAGNOSIS — R5383 Other fatigue: Secondary | ICD-10-CM | POA: Diagnosis not present

## 2013-03-01 DIAGNOSIS — J209 Acute bronchitis, unspecified: Secondary | ICD-10-CM

## 2013-03-01 DIAGNOSIS — I10 Essential (primary) hypertension: Secondary | ICD-10-CM | POA: Diagnosis not present

## 2013-03-01 DIAGNOSIS — D61818 Other pancytopenia: Secondary | ICD-10-CM

## 2013-03-01 LAB — CBC WITH DIFFERENTIAL/PLATELET
Basophils Absolute: 0 10*3/uL (ref 0.0–0.1)
Basophils Relative: 1 % (ref 0–1)
HCT: 32.5 % — ABNORMAL LOW (ref 39.0–52.0)
Hemoglobin: 11.3 g/dL — ABNORMAL LOW (ref 13.0–17.0)
Lymphocytes Relative: 14 % (ref 12–46)
Monocytes Absolute: 0.5 10*3/uL (ref 0.1–1.0)
Neutro Abs: 2.7 10*3/uL (ref 1.7–7.7)
Neutrophils Relative %: 69 % (ref 43–77)
RDW: 13.7 % (ref 11.5–15.5)
WBC: 4 10*3/uL (ref 4.0–10.5)

## 2013-03-01 LAB — BASIC METABOLIC PANEL
BUN: 13 mg/dL (ref 6–23)
Calcium: 9 mg/dL (ref 8.4–10.5)
Glucose, Bld: 98 mg/dL (ref 70–99)
Sodium: 140 mEq/L (ref 135–145)

## 2013-03-01 MED ORDER — PENICILLIN V POTASSIUM 500 MG PO TABS: 500.0000 mg | ORAL_TABLET | Freq: Three times a day (TID) | ORAL | Status: DC

## 2013-03-01 MED ORDER — BENZONATATE 100 MG PO CAPS: 100.0000 mg | ORAL_CAPSULE | Freq: Three times a day (TID) | ORAL | Status: DC | PRN

## 2013-03-01 NOTE — Patient Instructions (Addendum)
F/u as before  You are being treated for bronchitis and sinusitis.   CXR and CBC and chm 7 today  Penicillin and tessalon perles have been sent in, call if you do not feel better

## 2013-03-01 NOTE — Progress Notes (Signed)
  Subjective:    Patient ID: [name], male    DOB: Jul 31, 1926, 79 y.o.   MRN: [id number]  HPI 1 week h/o increased head and chest congestion with yellow green sputum and nasal drainage, intermittent chills and possible undocumented fever, sweating at night. C/o increased fatigue and poor appetite since ill. C/o left knee pain, stopped PT and was recently treated by ortho   Review of Systems See HPI  Denies chest pains, palpitations and leg swelling Denies abdominal pain, nausea, vomiting,diarrhea or constipation.   Denies dysuria, frequency, hesitancy or incontinence.  Denies headaches, seizures, numbness, or tingling. Denies depression, anxiety or insomnia. Denies skin break down or rash.        Objective:   Physical Exam  Patient alert and oriented and in no cardiopulmonary distress.  HEENT: No facial asymmetry, EOMI, maxillary sinus tenderness,  oropharynx pink and moist.  Neck supple no adenopathy.TM clear  Chest: basilar crackles, no wheezes, adequate air entry   CVS: S1, S2 no murmurs, no S3.  ABD: Soft non tender. Bowel sounds normal.  Ext: No edema  MS: Decreased  ROM spine, shoulders, hips and knees.  Skin: Intact, no ulcerations or rash noted.  Psych: Good eye contact, normal affect. Memory impaired  not anxious or depressed appearing.  CNS: CN 2-12 intact, power, tone and sensation normal throughout.       Assessment & Plan:

## 2013-03-03 DIAGNOSIS — J019 Acute sinusitis, unspecified: Secondary | ICD-10-CM | POA: Insufficient documentation

## 2013-03-03 NOTE — Assessment & Plan Note (Signed)
Worsened since acute illness

## 2013-03-03 NOTE — Assessment & Plan Note (Signed)
Decongestants and antibiotics prescribed 

## 2013-03-03 NOTE — Assessment & Plan Note (Signed)
Improved numbers, followed by hematology

## 2013-03-03 NOTE — Assessment & Plan Note (Signed)
Controlled, no change in medication  

## 2013-03-03 NOTE — Assessment & Plan Note (Signed)
antibioitic course prescribed

## 2013-03-14 DIAGNOSIS — M25519 Pain in unspecified shoulder: Secondary | ICD-10-CM | POA: Diagnosis not present

## 2013-03-14 DIAGNOSIS — M25579 Pain in unspecified ankle and joints of unspecified foot: Secondary | ICD-10-CM | POA: Diagnosis not present

## 2013-03-28 DIAGNOSIS — Z09 Encounter for follow-up examination after completed treatment for conditions other than malignant neoplasm: Secondary | ICD-10-CM | POA: Diagnosis not present

## 2013-03-28 DIAGNOSIS — C439 Malignant melanoma of skin, unspecified: Secondary | ICD-10-CM | POA: Diagnosis not present

## 2013-04-02 ENCOUNTER — Other Ambulatory Visit: Payer: Self-pay

## 2013-04-02 DIAGNOSIS — M159 Polyosteoarthritis, unspecified: Secondary | ICD-10-CM

## 2013-04-02 MED ORDER — FENTANYL 50 MCG/HR TD PT72: 1.0000 | MEDICATED_PATCH | TRANSDERMAL | Status: DC

## 2013-04-10 DIAGNOSIS — H4011X0 Primary open-angle glaucoma, stage unspecified: Secondary | ICD-10-CM | POA: Diagnosis not present

## 2013-04-10 DIAGNOSIS — H26499 Other secondary cataract, unspecified eye: Secondary | ICD-10-CM | POA: Diagnosis not present

## 2013-04-10 DIAGNOSIS — Z961 Presence of intraocular lens: Secondary | ICD-10-CM | POA: Diagnosis not present

## 2013-04-18 DIAGNOSIS — M25579 Pain in unspecified ankle and joints of unspecified foot: Secondary | ICD-10-CM | POA: Diagnosis not present

## 2013-04-18 DIAGNOSIS — M1A00X0 Idiopathic chronic gout, unspecified site, without tophus (tophi): Secondary | ICD-10-CM | POA: Diagnosis not present

## 2013-04-24 ENCOUNTER — Telehealth: Payer: Self-pay | Admitting: Family Medicine

## 2013-04-24 NOTE — Telephone Encounter (Signed)
States he is having weakness in his legs and feet and has a hard time walking over 100 ft without having to rest. Made appt to come in for tomorrow. Seeing Dr Hilda Lias but not getting any relief

## 2013-04-25 ENCOUNTER — Encounter: Payer: Self-pay | Admitting: Family Medicine

## 2013-04-25 ENCOUNTER — Ambulatory Visit (INDEPENDENT_AMBULATORY_CARE_PROVIDER_SITE_OTHER): Payer: Medicare Other | Admitting: Family Medicine

## 2013-04-25 VITALS — BP 118/80 | HR 61 | Resp 16 | Ht 68.0 in | Wt 167.0 lb

## 2013-04-25 DIAGNOSIS — G309 Alzheimer's disease, unspecified: Secondary | ICD-10-CM | POA: Diagnosis not present

## 2013-04-25 DIAGNOSIS — H905 Unspecified sensorineural hearing loss: Secondary | ICD-10-CM

## 2013-04-25 DIAGNOSIS — M549 Dorsalgia, unspecified: Secondary | ICD-10-CM | POA: Diagnosis not present

## 2013-04-25 DIAGNOSIS — F0280 Dementia in other diseases classified elsewhere without behavioral disturbance: Secondary | ICD-10-CM

## 2013-04-25 DIAGNOSIS — I5022 Chronic systolic (congestive) heart failure: Secondary | ICD-10-CM | POA: Diagnosis not present

## 2013-04-25 DIAGNOSIS — G8929 Other chronic pain: Secondary | ICD-10-CM

## 2013-04-25 MED ORDER — ASPIRIN EC 81 MG PO TBEC: 81.0000 mg | DELAYED_RELEASE_TABLET | Freq: Every day | ORAL | Status: DC

## 2013-04-25 MED ORDER — DONEPEZIL HCL 5 MG PO TABS: 5.0000 mg | ORAL_TABLET | Freq: Every day | ORAL | Status: DC

## 2013-04-25 NOTE — Patient Instructions (Addendum)
F/u in 2 month, call if you need me before  You need to take the prednisone 1 tablet 3 times daily tomorrow (Thursday) and also on  Friday, then one tablet twice daily, on Saturday and Sunday, then one tablet once daily next week Monday, Tuesday and Wednesday   You are referred to physical therapy twice weekly for 4 weeks   You are referred for a brain scan because of memory loss and acute confusion  New for your memory is aricept 5 mg one daily  You Need to start aspirin 81mg  one daily AFTER the prednisone to reduce your risk of stroke, start August 1  The valium may make you confused so I suggest you not take it although you say it helps you  No driving since you are confused  Chem 7 and tsh today

## 2013-04-25 NOTE — Progress Notes (Signed)
  Subjective:    Patient ID: [name], male    DOB: 04/19/1926, 79 y.o.   MRN: [id number]  HPI Pt in c/o uncontrolled right leg pain and numbness with difficulty walking, he has established severe arhrtitis in his spine Got prednisone last week from Dr Hilda Lias has not taken them Wife states this past weekend he was acutely confused, did not know where he was when going out to the pharmacy.Has a diagnosis of dementia ,was on medication which he stopped on his own Has valium 2 mg tablets which  Was prescribed by urology which ma be contributing some of his confusion and I advise he hold off,will do a brain scan due to acute episode described by his wife.    Review of Systems See HPI Denies recent fever or chills. Denies sinus pressure, nasal congestion, ear pain or sore throat. Denies chest congestion, productive cough or wheezing. Denies chest pains, palpitations and leg swelling Denies abdominal pain, nausea, vomiting,diarrhea or constipation.   Denies headaches, seizures, numbness, or tingling. Denies depression, anxiety or insomnia. Denies skin break down or rash.        Objective:   Physical Exam Patient alert and oriented and in no cardiopulmonary distress.  HEENT: No facial asymmetry, EOMI, no sinus tenderness,  oropharynx pink and moist.  Neck supple no adenopathy.  Chest: Clear to auscultation bilaterally.  CVS: S1, S2 no murmurs, no S3.  ABD: Soft non tender. Bowel sounds normal.  Ext: No edema  MS: decreased ROM  spine, shoulders, hips and knees.  Skin: Intact, no ulcerations or rash noted.  Psych: Good eye contact, normal affect. Memory loss not anxious or depressed appearing.  CNS: CN 2-12 intact, power, decrease in right lower extremity        Assessment & Plan:

## 2013-04-26 LAB — BASIC METABOLIC PANEL
CO2: 32 mEq/L (ref 19–32)
Calcium: 9 mg/dL (ref 8.4–10.5)
Sodium: 143 mEq/L (ref 135–145)

## 2013-04-26 LAB — TSH: TSH: 1.805 u[IU]/mL (ref 0.350–4.500)

## 2013-04-29 NOTE — Assessment & Plan Note (Signed)
Recent acute episode of confusion in the past week, per his wife, he is to have a brain scan, and is to resume aricept. I have advised pt and his wife that he should not drive

## 2013-04-29 NOTE — Assessment & Plan Note (Signed)
Acute radiculopathy symptoms, explained how he needs to take the prednisone dose pack recently provided by orhto with half otf the tablets given,this should provide some relief as well as PT. Recently he started then stopped on his own, he is to resume therapy an agrees

## 2013-04-29 NOTE — Assessment & Plan Note (Signed)
Would benefit from hearing aids, no interest at this time

## 2013-04-29 NOTE — Assessment & Plan Note (Signed)
Currently stable.

## 2013-04-30 ENCOUNTER — Ambulatory Visit (HOSPITAL_COMMUNITY)
Admission: RE | Admit: 2013-04-30 | Discharge: 2013-04-30 | Disposition: A | Discharge status: Home / Self Care | Payer: Medicare Other | Source: Ambulatory Visit | Attending: Family Medicine | Admitting: Family Medicine

## 2013-04-30 DIAGNOSIS — F0280 Dementia in other diseases classified elsewhere without behavioral disturbance: Secondary | ICD-10-CM

## 2013-04-30 DIAGNOSIS — G319 Degenerative disease of nervous system, unspecified: Secondary | ICD-10-CM | POA: Diagnosis not present

## 2013-04-30 DIAGNOSIS — R413 Other amnesia: Secondary | ICD-10-CM | POA: Diagnosis not present

## 2013-04-30 DIAGNOSIS — F05 Delirium due to known physiological condition: Secondary | ICD-10-CM | POA: Diagnosis not present

## 2013-05-01 ENCOUNTER — Ambulatory Visit (HOSPITAL_COMMUNITY)
Admission: RE | Admit: 2013-05-01 | Discharge: 2013-05-01 | Disposition: A | Discharge status: Home / Self Care | Payer: Medicare Other | Source: Ambulatory Visit | Attending: Family Medicine | Admitting: Family Medicine

## 2013-05-01 DIAGNOSIS — R269 Unspecified abnormalities of gait and mobility: Secondary | ICD-10-CM | POA: Insufficient documentation

## 2013-05-01 DIAGNOSIS — M549 Dorsalgia, unspecified: Secondary | ICD-10-CM | POA: Diagnosis not present

## 2013-05-01 DIAGNOSIS — R262 Difficulty in walking, not elsewhere classified: Secondary | ICD-10-CM | POA: Diagnosis not present

## 2013-05-01 DIAGNOSIS — IMO0001 Reserved for inherently not codable concepts without codable children: Secondary | ICD-10-CM | POA: Insufficient documentation

## 2013-05-01 DIAGNOSIS — R29898 Other symptoms and signs involving the musculoskeletal system: Secondary | ICD-10-CM | POA: Diagnosis not present

## 2013-05-01 DIAGNOSIS — R293 Abnormal posture: Secondary | ICD-10-CM | POA: Insufficient documentation

## 2013-05-01 DIAGNOSIS — I10 Essential (primary) hypertension: Secondary | ICD-10-CM | POA: Insufficient documentation

## 2013-05-01 NOTE — Evaluation (Signed)
Physical Therapy Evaluation  Patient Details  Name: Arael Piccione MRN: [id number] Date of Birth: June 09, 1926  Today's Date: 05/01/2013 Time: 1110-1145 PT Time Calculation (min): 35 min Charge:  evaluation             Visit#: 1 of 12  Re-eval: 05/31/13 Assessment Diagnosis: back pain Prior Therapy: none  Authorization: medicare    Past Medical History:  Past Medical History  Diagnosis Date  . Chronic systolic heart failure   . Mitral regurgitation   . Bradycardia   . Syncope   . Screening for lipoid disorders   . Fatigue   . Rash and other nonspecific skin eruption   . Costochondritis, acute   . Glaucoma   . Erectile dysfunction   . Visual changes   . Cancer   . Melanoma in situ 2011  . Pancytopenia   . Pancytopenia    Past Surgical History:  Past Surgical History  Procedure Laterality Date  . Back surgery      x3 most recent   . Ingruinal herniopathy bilateral in teens    . Interstim  placed for intestitial cystitis    . Tonsillectomy around  1950  . Surgery to left elbow  2002  . Cataract extraction, bilateral  left in 1997 right in 2003  . Pacemaker insertion  2010  . Left heel (cancerous area removed)  Jan 2012    Subjective Symptoms/Limitations Symptoms: Mr. Wainer states that he has been having back pain for years but in the recent months he has been experiencing a significant amount of numbess.  The pateint states his right leg bothers him more than the left.  The patient has noticed that he is not walking correctly for the past 4-6 weeks therefore he is being referred to therapy to improve his gait for improved safety, decrease his pain   Pertinent History: 3 previous back surgeries; postive history of skin cancer on his heel,  How long can you sit comfortably?: alright How long can you stand comfortably?: increases pain after 3 minutes How long can you walk comfortably?: able to walk for five minutes gait is slow and shuffled. Pain Assessment Currently in  Pain?: Yes (back feels tired) Pain Score: 2  (back feels tired; highest pain  9/10) Pain Location: Back Pain Radiating Towards: B LE Pain Onset: More than a month ago Pain Frequency: Intermittent Pain Relieving Factors: sitting Effect of Pain on Daily Activities: increases     Assessment RLE Strength Right Hip Flexion: 2/5 Right Hip Extension: 2/5 Right Hip ABduction: 2/5 Right Knee Flexion: 4/5 Right Knee Extension: 5/5 Right Ankle Dorsiflexion: 3+/5 LLE Strength Left Hip Flexion: 2/5 Left Hip Extension: 2/5 Left Hip ABduction: 2/5 Left Knee Flexion: 4/5 Left Knee Extension: 5/5 Left Ankle Dorsiflexion: 3+/5 Lumbar AROM Lumbar Flexion: decreased 70% Lumbar Extension: decreased 100% Lumbar - Right Side Bend: decreased 80 % Lumbar - Left Side Bend: decreased 90% Lumbar - Right Rotation: decreased 80% Lumbar - Left Rotation: decreased 80% Palpation Palpation:  (tight mm lumbar and LE)  Exercise/Treatments Mobility/Balance  Posture/Postural Control Posture/Postural Control: Postural limitations Postural Limitations: Pt stands and walks with knees  and hips flexed     Supine Ab Set: 5 reps Glut Set: 5 reps Other Supine Lumbar Exercises: addductor set x 10 Knee to chest x 5 Hamstring stretch x 5    Physical Therapy Assessment and Plan PT Assessment and Plan Clinical Impression Statement: Pt referred to PT for radicular pain.  PT exhibits tight hip flexors, tight  knee flexors and weakness of B LE.  Pt will benefit from skilled PT to educate in HEP, strengthen core and LE and gait train to improve safety of ambulation and decrease pain. Pt will benefit from skilled therapeutic intervention in order to improve on the following deficits: Abnormal gait;Decreased activity tolerance;Decreased balance;Difficulty walking;Decreased strength;Pain;Decreased range of motion Rehab Potential: Good PT Frequency: Min 2X/week PT Duration: 6 weeks PT Treatment/Interventions: Gait  training;Therapeutic exercise;Therapeutic activities;Manual techniques PT Plan: begin gait training with cane for improved walking safety.  Continue with stretching and stability exercieses.    Goals Home Exercise Program Pt/caregiver will Perform Home Exercise Program: For increased strengthening;For increased ROM PT Goal: Perform Home Exercise Program - Progress: Goal set today PT Short Term Goals Time to Complete Short Term Goals: 2 weeks PT Short Term Goal 1: Pt to be able to stand for 10 minutes to make a sandwich PT Short Term Goal 2: Pt to be ambulating for 10 minute at a time to improve general health PT Short Term Goal 3: Back pain to be no greater than a 5/10 80% of the day PT Short Term Goal 4: no radicular sx PT Long Term Goals Time to Complete Long Term Goals: 4 weeks PT Long Term Goal 1: LE strength to be improved one grade to allow the below to occur PT Long Term Goal 2: Pt to be able to stand for 15 minutes to complete yard activity Long Term Goal 3: Pt to be able to walk for 30 minutes to be able to go to the store Long Term Goal 4: Pt to no longer have a shuffling gait PT Long Term Goal 5: Pt pain to be no greateer than a 3/10 80% of the day   Problem List Patient Active Problem List   Diagnosis Date Noted  . Bilateral leg weakness 05/01/2013  . Postural imbalance 05/01/2013  . Acute bronchitis 03/01/2013  . Routine general medical examination at a health care facility 11/06/2012  . Laceration of finger of left hand 10/12/2012  . Nocturia 07/10/2012  . Difficulty walking 06/12/2012  . Difficulty in walking 06/12/2012  . Abnormal gait 06/12/2012  . Lower extremity weakness 05/31/2012  . GERD (gastroesophageal reflux disease) 05/09/2012  . Change in bowel habits 04/19/2012  . Back pain, chronic 04/19/2012  . Dermatomycosis 12/29/2011  . Alzheimer's dementia 11/17/2011  . Malignant melanoma 03/03/2011  . BACK PAIN 02/18/2010  . GEN OSTEOARTHROSIS INVOLVING  MULTIPLE SITES 11/25/2009  . CENTRAL HEARING LOSS 11/10/2009  . CARDIAC PACEMAKER IN SITU 07/22/2009  . INTERSTITIAL CYSTITIS 07/14/2009  . Pancytopenia 12/31/2008  . MITRAL REGURGITATION, MODERATE 12/31/2008  . CHRONIC SYSTOLIC HEART FAILURE 12/31/2008  . FATIGUE 12/05/2008  . SYNCOPE 05/03/2008  . ERECTILE DYSFUNCTION 01/31/2008  . GLAUCOMA 01/31/2008  . HYPERTENSION 01/31/2008    General Behavior During Therapy: WFL for tasks assessed/performed PT Plan of Care PT Home Exercise Plan: given  GP Functional Assessment Tool Used: clinical judgement- pt unable to understand Oswestry Functional Limitation: Mobility: Walking and moving around Mobility: Walking and Moving Around Current Status ([id number]): At least 60 percent but less than 80 percent impaired, limited or restricted Mobility: Walking and Moving Around Goal Status [phone]): At least 20 percent but less than 40 percent impaired, limited or restricted  Manuelita Moxon,CINDY 05/01/2013, 4:37 PM  Physician Documentation Your signature is required to indicate approval of the treatment plan as stated above.  Please sign and either send electronically or make a copy of this report for your files and  return this physician signed original.   Please mark one 1.__approve of plan  2. ___approve of plan with the following conditions.   ______________________________                                                          _____________________ Physician Signature                                                                                                             Date

## 2013-05-08 ENCOUNTER — Ambulatory Visit (HOSPITAL_COMMUNITY)
Admission: RE | Admit: 2013-05-08 | Discharge: 2013-05-08 | Disposition: A | Discharge status: Home / Self Care | Payer: Medicare Other | Source: Ambulatory Visit | Attending: Family Medicine | Admitting: Family Medicine

## 2013-05-08 DIAGNOSIS — R29898 Other symptoms and signs involving the musculoskeletal system: Secondary | ICD-10-CM | POA: Diagnosis not present

## 2013-05-08 DIAGNOSIS — M549 Dorsalgia, unspecified: Secondary | ICD-10-CM | POA: Diagnosis not present

## 2013-05-08 DIAGNOSIS — I10 Essential (primary) hypertension: Secondary | ICD-10-CM | POA: Insufficient documentation

## 2013-05-08 DIAGNOSIS — R262 Difficulty in walking, not elsewhere classified: Secondary | ICD-10-CM | POA: Diagnosis not present

## 2013-05-08 DIAGNOSIS — R269 Unspecified abnormalities of gait and mobility: Secondary | ICD-10-CM | POA: Diagnosis not present

## 2013-05-08 DIAGNOSIS — IMO0001 Reserved for inherently not codable concepts without codable children: Secondary | ICD-10-CM | POA: Diagnosis not present

## 2013-05-08 NOTE — Progress Notes (Signed)
Physical Therapy Treatment Patient Details  Name: [name] MRN: [id number] Date of Birth: [birth year]  Today's Date: 05/08/2013 Time: 1021-1100 PT Time Calculation (min): 39 min  Visit#: 2 of 12  Re-eval: 05/31/13 Charges: Therex x 38' ([id number])  Authorization: medicare  Authorization Visit#: 2 of 10   Subjective: Symptoms/Limitations Symptoms: Pt states that he feels like his muscles are very weak. Pain Assessment Currently in Pain?: Yes Pain Score: 5  Pain Location: Back Pain Orientation: Lower   Exercise/Treatments Aerobic Stationary Bike: NuStep 8' L0 Standing Other Standing Lumbar Exercises: Gait training around dept with SPC with VCs for increasing stride length, improving posture, and heel toe pattern Supine Ab Set: 10 reps Bent Knee Raise: 10 reps Bridge: 10 reps Other Supine Lumbar Exercises: addductor squeeze 10x5" Other Supine Lumbar Exercises: abd/add slides x 10 bilateral  Physical Therapy Assessment and Plan PT Assessment and Plan Clinical Impression Statement: Pt requires multimodal cueing with all exercises to complete correctly. Began gait training with SPC with vc's for posture, increases stride length and heel-toe pattern. Began NuStep at end of session to improve strength and activity tolerance. Pt will benefit from skilled therapeutic intervention in order to improve on the following deficits: Abnormal gait;Decreased activity tolerance;Decreased balance;Difficulty walking;Decreased strength;Pain;Decreased range of motion Rehab Potential: Good PT Frequency: Min 2X/week PT Duration: 6 weeks PT Treatment/Interventions: Gait training;Therapeutic exercise;Therapeutic activities;Manual techniques PT Plan: Continue with stretching, stability exercises and gait training.      Problem List Patient Active Problem List   Diagnosis Date Noted  . Bilateral leg weakness 05/01/2013  . Postural imbalance 05/01/2013  . Acute bronchitis 03/01/2013  . Routine  general medical examination at a health care facility 11/06/2012  . Laceration of finger of left hand 10/12/2012  . Nocturia 07/10/2012  . Difficulty walking 06/12/2012  . Difficulty in walking 06/12/2012  . Abnormal gait 06/12/2012  . Lower extremity weakness 05/31/2012  . GERD (gastroesophageal reflux disease) 05/09/2012  . Change in bowel habits 04/19/2012  . Back pain, chronic 04/19/2012  . Dermatomycosis 12/29/2011  . Alzheimer's dementia 11/17/2011  . Malignant melanoma 03/03/2011  . BACK PAIN 02/18/2010  . GEN OSTEOARTHROSIS INVOLVING MULTIPLE SITES 11/25/2009  . CENTRAL HEARING LOSS 11/10/2009  . CARDIAC PACEMAKER IN SITU 07/22/2009  . INTERSTITIAL CYSTITIS 07/14/2009  . Pancytopenia 12/31/2008  . MITRAL REGURGITATION, MODERATE 12/31/2008  . CHRONIC SYSTOLIC HEART FAILURE 12/31/2008  . FATIGUE 12/05/2008  . SYNCOPE 05/03/2008  . ERECTILE DYSFUNCTION 01/31/2008  . GLAUCOMA 01/31/2008  . HYPERTENSION 01/31/2008    PT - End of Session Activity Tolerance: Patient tolerated treatment well General Behavior During Therapy: [facility] for tasks assessed/performed  Seth Bake, PTA  05/08/2013, 11:01 AM

## 2013-05-10 ENCOUNTER — Ambulatory Visit (HOSPITAL_COMMUNITY)
Admission: RE | Admit: 2013-05-10 | Discharge: 2013-05-10 | Disposition: A | Discharge status: Home / Self Care | Payer: Medicare Other | Source: Ambulatory Visit | Attending: Family Medicine | Admitting: Family Medicine

## 2013-05-10 NOTE — Progress Notes (Signed)
Physical Therapy Treatment Patient Details  Name: [name] MRN: [id number] Date of Birth: 02/15/1926  Today's Date: 05/10/2013 Time: 1020-1058 PT Time Calculation (min): 38 min Charges:  Therex x 35' (1020-1055)  Visit#: 3 of 12  Re-eval: 05/31/13  Authorization: medicare  Authorization Visit#: 3 of 10   Subjective: Symptoms/Limitations Symptoms: Pt states that he feels weak and tired. Pain Assessment Currently in Pain?: Yes Pain Score: 5  Pain Location: Back Pain Orientation: Lower   Exercise/Treatments Stretches Lower Trunk Rotation: 5 reps Aerobic Stationary Bike: NuStep 10' L2 to improve strength and activity tolerance with VCs to improve speed Seated Sit to Stand: 5 reps;Limitations Sit to Stand Limitations: with upper extremity assistance Supine Ab Set: 10 reps;5 seconds Clam: 10 reps Bent Knee Raise: 10 reps Bridge: 10 reps Other Supine Lumbar Exercises: addductor squeeze 10x5" Other Supine Lumbar Exercises: abd/add slides x 10 bilateral  Physical Therapy Assessment and Plan PT Assessment and Plan Clinical Impression Statement: Pt continues to require multimodal cueing with all therex to complete correctly. Pt continues to ambulate with forward flexed postured and decreased stride length. Gait deviations are due to weakness. Began sit to stand to improve lower extremity strength and power. Pt will benefit from skilled therapeutic intervention in order to improve on the following deficits: Abnormal gait;Decreased activity tolerance;Decreased balance;Difficulty walking;Decreased strength;Pain;Decreased range of motion Rehab Potential: Good PT Frequency: Min 2X/week PT Duration: 6 weeks PT Treatment/Interventions: Gait training;Therapeutic exercise;Therapeutic activities;Manual techniques PT Plan: Continue with stretching, stability exercises and gait training.      Problem List Patient Active Problem List   Diagnosis Date Noted  . Bilateral leg weakness  05/01/2013  . Postural imbalance 05/01/2013  . Acute bronchitis 03/01/2013  . Routine general medical examination at a health care facility 11/06/2012  . Laceration of finger of left hand 10/12/2012  . Nocturia 07/10/2012  . Difficulty walking 06/12/2012  . Difficulty in walking 06/12/2012  . Abnormal gait 06/12/2012  . Lower extremity weakness 05/31/2012  . GERD (gastroesophageal reflux disease) 05/09/2012  . Change in bowel habits 04/19/2012  . Back pain, chronic 04/19/2012  . Dermatomycosis 12/29/2011  . Alzheimer's dementia 11/17/2011  . Malignant melanoma 03/03/2011  . BACK PAIN 02/18/2010  . GEN OSTEOARTHROSIS INVOLVING MULTIPLE SITES 11/25/2009  . CENTRAL HEARING LOSS 11/10/2009  . CARDIAC PACEMAKER IN SITU 07/22/2009  . INTERSTITIAL CYSTITIS 07/14/2009  . Pancytopenia 12/31/2008  . MITRAL REGURGITATION, MODERATE 12/31/2008  . CHRONIC SYSTOLIC HEART FAILURE 12/31/2008  . FATIGUE 12/05/2008  . SYNCOPE 05/03/2008  . ERECTILE DYSFUNCTION 01/31/2008  . GLAUCOMA 01/31/2008  . HYPERTENSION 01/31/2008    PT - End of Session Activity Tolerance: Patient tolerated treatment well General Behavior During Therapy: [facility] for tasks assessed/performed  Seth Bake, PTA 05/10/2013, 11:56 AM

## 2013-05-15 ENCOUNTER — Ambulatory Visit (HOSPITAL_COMMUNITY)
Admission: RE | Admit: 2013-05-15 | Discharge: 2013-05-15 | Disposition: A | Discharge status: Home / Self Care | Payer: Medicare Other | Source: Ambulatory Visit | Attending: Family Medicine | Admitting: Family Medicine

## 2013-05-15 ENCOUNTER — Encounter: Payer: Self-pay | Admitting: Internal Medicine

## 2013-05-15 ENCOUNTER — Ambulatory Visit (INDEPENDENT_AMBULATORY_CARE_PROVIDER_SITE_OTHER): Payer: Medicare Other | Admitting: Internal Medicine

## 2013-05-15 VITALS — BP 116/68 | HR 60 | Ht 68.0 in | Wt 167.0 lb

## 2013-05-15 DIAGNOSIS — I442 Atrioventricular block, complete: Secondary | ICD-10-CM | POA: Diagnosis not present

## 2013-05-15 DIAGNOSIS — R55 Syncope and collapse: Secondary | ICD-10-CM | POA: Diagnosis not present

## 2013-05-15 DIAGNOSIS — I10 Essential (primary) hypertension: Secondary | ICD-10-CM

## 2013-05-15 DIAGNOSIS — I5022 Chronic systolic (congestive) heart failure: Secondary | ICD-10-CM

## 2013-05-15 DIAGNOSIS — Z950 Presence of cardiac pacemaker: Secondary | ICD-10-CM

## 2013-05-15 LAB — PACEMAKER DEVICE OBSERVATION
AL AMPLITUDE: 2.8 mv
AL IMPEDENCE PM: 333 Ohm
BAMS-0001: 150 {beats}/min
BATTERY VOLTAGE: 2.968 V
LV LEAD THRESHOLD: 1 V
VENTRICULAR PACING PM: 100

## 2013-05-15 NOTE — Assessment & Plan Note (Signed)
His medtronic BiV PM is working normally. Will recheck in several months. 

## 2013-05-15 NOTE — Progress Notes (Signed)
HPI [name] returns today for followup. He is a pleasant elderly man with chronic systolic CHF, non-ischemic CM, HTN and arthritis. He is s/p BiV PPM insertion. He returns today for followup. In the interim, he has done well with no chest pain or sob. No syncope. He does admit to fairly frequent falls. He has not injured himself. Allergies  Allergen Reactions  . Codeine      Current Outpatient Prescriptions  Medication Sig Dispense Refill  . amLODipine (NORVASC) 2.5 MG tablet Take 1 tablet (2.5 mg total) by mouth daily.  90 tablet  3  . aspirin EC 81 MG tablet Take 1 tablet (81 mg total) by mouth daily.  150 tablet  2  . celecoxib (CELEBREX) 200 MG capsule Take 200 mg by mouth daily.      . diclofenac sodium (VOLTAREN) 1 % GEL Apply 2 g topically 4 (four) times daily.      Marland Kitchen donepezil (ARICEPT) 5 MG tablet Take 1 tablet (5 mg total) by mouth at bedtime.  30 tablet  2  . dorzolamide-timolol (COSOPT) 22.3-6.8 MG/ML ophthalmic solution       . fentaNYL (DURAGESIC) 50 MCG/HR Place 1 patch (50 mcg total) onto the skin every 3 (three) days.  30 patch  0  . latanoprost (XALATAN) 0.005 % ophthalmic solution       . Meth-Hyo-M Bl-Na Phos-Ph Sal (URIBEL) 118 MG CAPS Take 1 capsule by mouth every 8 (eight) hours.      Marland Kitchen PREDNISONE, PAK, PO Take 1 tablet by mouth daily.      . Tamsulosin HCl (FLOMAX) 0.4 MG CAPS Take by mouth.      . diazepam (VALIUM) 2 MG tablet Take 2 mg by mouth at bedtime as needed for anxiety.       No current facility-administered medications for this visit.     Past Medical History  Diagnosis Date  . Chronic systolic heart failure   . Mitral regurgitation   . Bradycardia   . Syncope   . Screening for lipoid disorders   . Fatigue   . Rash and other nonspecific skin eruption   . Costochondritis, acute   . Glaucoma   . Erectile dysfunction   . Visual changes   . Cancer   . Melanoma in situ 2011  . Pancytopenia   . Pancytopenia     ROS:   All systems reviewed  and negative except as noted in the HPI.   Past Surgical History  Procedure Laterality Date  . Back surgery      x3 most recent   . Ingruinal herniopathy bilateral in teens    . Interstim  placed for intestitial cystitis    . Tonsillectomy around  1950  . Surgery to left elbow  2002  . Cataract extraction, bilateral  left in 1997 right in 2003  . Pacemaker insertion  2010  . Left heel (cancerous area removed)  Jan 2012     Family History  Problem Relation Age of Onset  . Dementia Mother   . Throat cancer Father   . Cancer Father     throat  . Lung cancer Sister      History   Social History  . Marital Status: Married    Spouse Name: N/A    Number of Children: N/A  . Years of Education: N/A   Occupational History  . retired     Social History Main Topics  . Smoking status: Former Games developer  . Smokeless tobacco: Not  on file  . Alcohol Use: No  . Drug Use: No  . Sexually Active: Not on file   Other Topics Concern  . Not on file   Social History Narrative  . No narrative on file     Ht 5\' 8"  (1.727 m)  Wt 167 lb 0.6 oz (75.769 kg)  BMI 25.4 kg/m2  Physical Exam:  Well appearing elderly man, NAD HEENT: Unremarkable Neck:  7 cm JVD, no thyromegally Back:  No CVA tenderness Lungs:  Clear with no wheezes HEART:  Regular rate rhythm, grade 3/6 systolic murmur, no rubs, no clicks Abd:  soft, positive bowel sounds, no organomegally, no rebound, no guarding Ext:  2 plus pulses, no edema, no cyanosis, no clubbing Skin:  No rashes no nodules Neuro:  CN II through XII intact, motor grossly intact   DEVICE  Normal device function.  See PaceArt for details.   Assess/Plan:

## 2013-05-15 NOTE — Assessment & Plan Note (Signed)
His blood pressure is well controlled. He will continue his current meds. 

## 2013-05-15 NOTE — Patient Instructions (Signed)
Your physician recommends that you schedule a follow-up appointment in: 6 months with [name] for a device check  Your physician wants you to follow-up in: 1 year with Dr. Taylor.  You will receive a reminder letter in the mail two months in advance. If you don't receive a letter, please call our office to schedule the follow-up appointment.   

## 2013-05-15 NOTE — Progress Notes (Signed)
Physical Therapy Treatment Patient Details  Name: [name] MRN: [id number] Date of Birth: 1925-12-05  Today's Date: 05/15/2013 Time: 1105-1150 PT Time Calculation (min): 45 min  Visit#: 4 of 12  Re-eval: 05/31/13 Charges: Therex x 40' (1105-1145)  Authorization: medicare  Authorization Visit#: 4 of 10   Subjective: Symptoms/Limitations Symptoms: Pt states that he feels fair. Pain Assessment Currently in Pain?: Yes Pain Score: 4  Pain Location: Back Pain Orientation: Lower   Exercise/Treatments Aerobic Stationary Bike: NuStep 10' hills #3 L2 To improve strength and activity tolerance.  Standing Other Standing Lumbar Exercises: Gait training around dept with SPC with VCs for increasing stride length, improving posture, and heel toe pattern Other Standing Lumbar Exercises: Tall marching with BUE assist at parallel bars x 10 bilateral Seated Sit to Stand: 5 reps Sit to Stand Limitations: without upper extremity assistance Supine Ab Set: 10 reps;5 seconds Clam: 10 reps Bent Knee Raise: 10 reps Bridge: 10 reps Other Supine Lumbar Exercises: addductor squeeze 10x5" Other Supine Lumbar Exercises: abd/add slides x 10 bilateral  Physical Therapy Assessment and Plan PT Assessment and Plan Clinical Impression Statement: Pt continues to require max cueing to complete therex correctly. Pt tends to close his eyes when completing supine exercises and appears to almost fall asleep if not stimulated by conversation. Pt requires multimodal cueing with gait training to improve posture and increased heel-toe pattern, stride length, and hip/knee flexion.  Pt will benefit from skilled therapeutic intervention in order to improve on the following deficits: Abnormal gait;Decreased activity tolerance;Decreased balance;Difficulty walking;Decreased strength;Pain;Decreased range of motion Rehab Potential: Good PT Frequency: Min 2X/week PT Duration: 6 weeks PT Treatment/Interventions: Gait  training;Therapeutic exercise;Therapeutic activities;Manual techniques PT Plan: Continue with stretching, stability exercises and gait training.     Problem List Patient Active Problem List   Diagnosis Date Noted  . Bilateral leg weakness 05/01/2013  . Postural imbalance 05/01/2013  . Acute bronchitis 03/01/2013  . Routine general medical examination at a health care facility 11/06/2012  . Laceration of finger of left hand 10/12/2012  . Nocturia 07/10/2012  . Difficulty walking 06/12/2012  . Difficulty in walking 06/12/2012  . Abnormal gait 06/12/2012  . Lower extremity weakness 05/31/2012  . GERD (gastroesophageal reflux disease) 05/09/2012  . Change in bowel habits 04/19/2012  . Back pain, chronic 04/19/2012  . Dermatomycosis 12/29/2011  . Alzheimer's dementia 11/17/2011  . Malignant melanoma 03/03/2011  . BACK PAIN 02/18/2010  . GEN OSTEOARTHROSIS INVOLVING MULTIPLE SITES 11/25/2009  . CENTRAL HEARING LOSS 11/10/2009  . CARDIAC PACEMAKER IN SITU 07/22/2009  . INTERSTITIAL CYSTITIS 07/14/2009  . Pancytopenia 12/31/2008  . MITRAL REGURGITATION, MODERATE 12/31/2008  . CHRONIC SYSTOLIC HEART FAILURE 12/31/2008  . FATIGUE 12/05/2008  . SYNCOPE 05/03/2008  . ERECTILE DYSFUNCTION 01/31/2008  . GLAUCOMA 01/31/2008  . HYPERTENSION 01/31/2008    PT - End of Session Activity Tolerance: Patient tolerated treatment well General Behavior During Therapy: [facility] for tasks assessed/performed  Seth Bake, PTA 05/15/2013, 12:02 PM

## 2013-05-15 NOTE — Assessment & Plan Note (Signed)
His symptoms are well compensated class 2. He will continue his current medical therapy. I have asked him to reduce his sodium intake.

## 2013-05-17 ENCOUNTER — Ambulatory Visit (HOSPITAL_COMMUNITY)
Admission: RE | Admit: 2013-05-17 | Discharge: 2013-05-17 | Disposition: A | Discharge status: Home / Self Care | Payer: Medicare Other | Source: Ambulatory Visit | Attending: Family Medicine | Admitting: Family Medicine

## 2013-05-17 NOTE — Progress Notes (Signed)
Physical Therapy Treatment Patient Details  Name: [name] MRN: [id number] Date of Birth: [birth year]  Today's Date: 05/17/2013 Time: 4098-1191 PT Time Calculation (min): 43 min Charges: Therex x 40' (1102-1142)  Visit#: 5 of 12  Re-eval: 05/31/13  Authorization: medicare  Authorization Visit#: 5 of 10   Subjective: Symptoms/Limitations Symptoms: Pt states that he feels like he is walking better. Pain Assessment Currently in Pain?: No/denies   Exercise/Treatments Aerobic Stationary Bike: NuStep 10' hills #3 L2 To improve strength and activity tolerance Standing Heel Raises: 10 reps Other Standing Lumbar Exercises: Hip abd/ext x 10 bilateral; tall marching x 10; hamstring curl x 10 Supine Clam: 10 reps Bent Knee Raise: 10 reps Bridge: 10 reps Straight Leg Raise: 10 reps Other Supine Lumbar Exercises: addductor ball squeeze 10x5" Other Supine Lumbar Exercises: abduction manual isometric 10x5"  Physical Therapy Assessment and Plan PT Assessment and Plan Clinical Impression Statement: Pt displays improved hip strength with bridges and bent knee raises. Pt continues with require multimodal cueing with all exercises to complete correctly. Pt appears more alert this session. Pt tends to slide feet on floor when walking around gym during session. Pt requires frequent VCs to avoid this. Pt tolerates tx well and appears to be progressing well toward goals. Pt will benefit from skilled therapeutic intervention in order to improve on the following deficits: Abnormal gait;Decreased activity tolerance;Decreased balance;Difficulty walking;Decreased strength;Pain;Decreased range of motion Rehab Potential: Good PT Frequency: Min 2X/week PT Duration: 6 weeks PT Treatment/Interventions: Gait training;Therapeutic exercise;Therapeutic activities;Manual techniques PT Plan: Continue with stretching, stability exercises and gait training.      Problem List Patient Active Problem List   Diagnosis Date Noted  . Bilateral leg weakness 05/01/2013  . Postural imbalance 05/01/2013  . Acute bronchitis 03/01/2013  . Routine general medical examination at a health care facility 11/06/2012  . Laceration of finger of left hand 10/12/2012  . Nocturia 07/10/2012  . Difficulty walking 06/12/2012  . Difficulty in walking 06/12/2012  . Abnormal gait 06/12/2012  . Lower extremity weakness 05/31/2012  . GERD (gastroesophageal reflux disease) 05/09/2012  . Change in bowel habits 04/19/2012  . Back pain, chronic 04/19/2012  . Dermatomycosis 12/29/2011  . Alzheimer's dementia 11/17/2011  . Malignant melanoma 03/03/2011  . BACK PAIN 02/18/2010  . GEN OSTEOARTHROSIS INVOLVING MULTIPLE SITES 11/25/2009  . CENTRAL HEARING LOSS 11/10/2009  . CARDIAC PACEMAKER IN SITU 07/22/2009  . INTERSTITIAL CYSTITIS 07/14/2009  . Pancytopenia 12/31/2008  . MITRAL REGURGITATION, MODERATE 12/31/2008  . CHRONIC SYSTOLIC HEART FAILURE 12/31/2008  . FATIGUE 12/05/2008  . SYNCOPE 05/03/2008  . ERECTILE DYSFUNCTION 01/31/2008  . GLAUCOMA 01/31/2008  . HYPERTENSION 01/31/2008    PT - End of Session Activity Tolerance: Patient tolerated treatment well General Behavior During Therapy: [facility] for tasks assessed/performed  Seth Bake, PTA  05/17/2013, 1:16 PM

## 2013-05-18 ENCOUNTER — Ambulatory Visit (INDEPENDENT_AMBULATORY_CARE_PROVIDER_SITE_OTHER): Payer: Medicare Other | Admitting: Urology

## 2013-05-18 ENCOUNTER — Encounter: Payer: Medicare Other | Admitting: Internal Medicine

## 2013-05-18 ENCOUNTER — Encounter: Payer: Self-pay | Admitting: Internal Medicine

## 2013-05-18 DIAGNOSIS — N3644 Muscular disorders of urethra: Secondary | ICD-10-CM

## 2013-05-18 DIAGNOSIS — R3915 Urgency of urination: Secondary | ICD-10-CM | POA: Diagnosis not present

## 2013-05-18 DIAGNOSIS — N3010 Interstitial cystitis (chronic) without hematuria: Secondary | ICD-10-CM

## 2013-05-22 ENCOUNTER — Ambulatory Visit (HOSPITAL_COMMUNITY)
Admission: RE | Admit: 2013-05-22 | Discharge: 2013-05-22 | Disposition: A | Discharge status: Home / Self Care | Payer: Medicare Other | Source: Ambulatory Visit | Attending: *Deleted | Admitting: *Deleted

## 2013-05-22 DIAGNOSIS — R293 Abnormal posture: Secondary | ICD-10-CM

## 2013-05-22 DIAGNOSIS — R29898 Other symptoms and signs involving the musculoskeletal system: Secondary | ICD-10-CM

## 2013-05-22 NOTE — Progress Notes (Signed)
Physical Therapy Treatment Patient Details  Name: [name] MRN: [id number] Date of Birth: 1926/08/03  Today's Date: 05/22/2013 Time: 1100-1145 PT Time Calculation (min): 45 min  Visit#: 6 of 12  Re-eval: 05/31/13  charge :  There ex 4098-1191   Subjective: Symptoms/Limitations Symptoms: Pt states that he is not doing his exercises as well as he should be. Pain Assessment Currently in Pain?: Yes Pain Score: 3  Pain Location: Back    Exercise/Treatments      Stretches Active Hamstring Stretch: 2 reps;30 seconds Lower Trunk Rotation: 5 reps Hip Flexor Stretch: 1 rep;60 seconds Standing Extension: 5 reps Standing Wall Slides: 5 reps Other Standing Lumbar Exercises: hip abduction; marching in place. Other Standing Lumbar Exercises: posture against wall    Supine Clam: 10 reps Bent Knee Raise: 15 reps Bridge: 15 reps;Limitations Bridge Limitations: with adductor squeeze Sidelying Hip Abduction: 10 reps  Physical Therapy Assessment and Plan PT Assessment and Plan Clinical Impression Statement: Added adductor squeeze, SL abduction and standing exercises.  Pt appears to have some comprehension difficulty and needs multiple cuing and repetition to complet exercises correctly.  Pt ambulates with flat foot as opposed to heel stirke. PT Plan: Continue with stretching, stability exercises and gait training.     Goals  progressing  Problem List Patient Active Problem List   Diagnosis Date Noted  . Bilateral leg weakness 05/01/2013  . Postural imbalance 05/01/2013  . Acute bronchitis 03/01/2013  . Routine general medical examination at a health care facility 11/06/2012  . Laceration of finger of left hand 10/12/2012  . Nocturia 07/10/2012  . Difficulty walking 06/12/2012  . Difficulty in walking 06/12/2012  . Abnormal gait 06/12/2012  . Lower extremity weakness 05/31/2012  . GERD (gastroesophageal reflux disease) 05/09/2012  . Change in bowel habits 04/19/2012  .  Back pain, chronic 04/19/2012  . Dermatomycosis 12/29/2011  . Alzheimer's dementia 11/17/2011  . Malignant melanoma 03/03/2011  . BACK PAIN 02/18/2010  . GEN OSTEOARTHROSIS INVOLVING MULTIPLE SITES 11/25/2009  . CENTRAL HEARING LOSS 11/10/2009  . CARDIAC PACEMAKER IN SITU 07/22/2009  . INTERSTITIAL CYSTITIS 07/14/2009  . Pancytopenia 12/31/2008  . MITRAL REGURGITATION, MODERATE 12/31/2008  . CHRONIC SYSTOLIC HEART FAILURE 12/31/2008  . FATIGUE 12/05/2008  . SYNCOPE 05/03/2008  . ERECTILE DYSFUNCTION 01/31/2008  . GLAUCOMA 01/31/2008  . HYPERTENSION 01/31/2008    PT - End of Session Activity Tolerance: Patient limited by lethargy General Behavior During Therapy: [facility] for tasks assessed/performed  GP    [name],CINDY 05/22/2013, 4:51 PM

## 2013-05-24 ENCOUNTER — Ambulatory Visit (HOSPITAL_COMMUNITY)
Admission: RE | Admit: 2013-05-24 | Discharge: 2013-05-24 | Disposition: A | Discharge status: Home / Self Care | Payer: Medicare Other | Source: Ambulatory Visit | Attending: Family Medicine | Admitting: Family Medicine

## 2013-05-24 NOTE — Progress Notes (Signed)
Physical Therapy Treatment Patient Details  Name: [name] MRN: [id number] Date of Birth: 20-Jun-1926  Today's Date: 05/24/2013 Time: 1110-1200 PT Time Calculation (min): 50 min  Visit#: 7 of 12  Re-eval: 05/31/13    Authorization: medicare  Authorization Time Period:    Authorization Visit#: 7 of 10   Subjective: Symptoms/Limitations Symptoms: Pt states his maiin problem right now are his ankles which have increased pain.,(Probably secondary to OA) Pain Assessment Pain Score: 5  Pain Location: Ankle Pain Orientation: Right;Left   Exercise/Treatments    Stretches Active Hamstring Stretch: 2 reps;30 seconds Lower Trunk Rotation: 5 reps Hip Flexor Stretch: 1 rep;60 seconds Standing Extension: 5 reps Aerobic Stationary Bike: bike L 2 x 7' Standing Other Standing Lumbar Exercises: posture against wall    Supine Glut Set: 10 reps Clam: 10 reps Bent Knee Raise: 15 reps Bridge: 15 reps;Limitations Bridge Limitations: with adductor squeeze Other Supine Lumbar Exercises: hip ab/adduction x 10; isometric ab/adduciotn x 10 Other Supine Lumbar Exercises: B AA ankle ROM    Modalities Modalities: Cryotherapy Cryotherapy Number Minutes Cryotherapy: 10 Minutes Cryotherapy Location: Ankle Type of Cryotherapy: Ice pack  Physical Therapy Assessment and Plan PT Assessment and Plan Clinical Impression Statement: Pt had ankle ROM and IP added to address ankle pain.  Pt stated ankle pain was decreased but not obliterated after therapy session.  Pt continues to need gt training as he continutes to walk with shuffling gt with no dorsiflexion. PT Plan: Continue with stretching, stability exercises and gait training.     Goals  progressing very slowly  Problem List Patient Active Problem List   Diagnosis Date Noted  . Bilateral leg weakness 05/01/2013  . Postural imbalance 05/01/2013  . Acute bronchitis 03/01/2013  . Routine general medical examination at a health care  facility 11/06/2012  . Laceration of finger of left hand 10/12/2012  . Nocturia 07/10/2012  . Difficulty walking 06/12/2012  . Difficulty in walking 06/12/2012  . Abnormal gait 06/12/2012  . Lower extremity weakness 05/31/2012  . GERD (gastroesophageal reflux disease) 05/09/2012  . Change in bowel habits 04/19/2012  . Back pain, chronic 04/19/2012  . Dermatomycosis 12/29/2011  . Alzheimer's dementia 11/17/2011  . Malignant melanoma 03/03/2011  . BACK PAIN 02/18/2010  . GEN OSTEOARTHROSIS INVOLVING MULTIPLE SITES 11/25/2009  . CENTRAL HEARING LOSS 11/10/2009  . CARDIAC PACEMAKER IN SITU 07/22/2009  . INTERSTITIAL CYSTITIS 07/14/2009  . Pancytopenia 12/31/2008  . MITRAL REGURGITATION, MODERATE 12/31/2008  . CHRONIC SYSTOLIC HEART FAILURE 12/31/2008  . FATIGUE 12/05/2008  . SYNCOPE 05/03/2008  . ERECTILE DYSFUNCTION 01/31/2008  . GLAUCOMA 01/31/2008  . HYPERTENSION 01/31/2008       GP    Brette Cast,CINDY 05/24/2013, 12:29 PM

## 2013-05-25 ENCOUNTER — Telehealth: Payer: Self-pay | Admitting: Family Medicine

## 2013-05-25 NOTE — Telephone Encounter (Signed)
Patient got some wrap for it and wrapped up his right ankle and said it felt better and will call us back if he needed anything

## 2013-05-29 ENCOUNTER — Ambulatory Visit (HOSPITAL_COMMUNITY)
Admission: RE | Admit: 2013-05-29 | Discharge: 2013-05-29 | Disposition: A | Discharge status: Home / Self Care | Payer: Medicare Other | Source: Ambulatory Visit | Attending: Family Medicine | Admitting: Family Medicine

## 2013-05-29 DIAGNOSIS — R29898 Other symptoms and signs involving the musculoskeletal system: Secondary | ICD-10-CM

## 2013-05-29 DIAGNOSIS — R293 Abnormal posture: Secondary | ICD-10-CM

## 2013-05-29 NOTE — Progress Notes (Signed)
Physical Therapy Treatment Patient Details  Name: [name] MRN: [id number] Date of Birth: [birth year]  Today's Date: 05/29/2013 Time: 1102-1138 PT Time Calculation (min): 36 min  Visit#: 8 of 12  Re-eval: 05/31/13 Authorization: medicare  Authorization Visit#: 8 of 10  Charges:  therex 4098-1191 (26'), gait 1130-1138 (8')  Subjective: Symptoms/Limitations Symptoms: Pt reported son brought him some menthol topical analgesic patches that helped a lot with pain to his Rt ankle.  Pt reports no pain today.  Pt states he feels like he could be done except his activity tolerance is not as he would hope. Pain Assessment Currently in Pain?: No/denies   Exercise/Treatments Aerobic Stationary Bike: NuStep 10' Level 3 hills #3 Gait tolerance:  Ambulation around hospital 3' max tolerance  Supine Bridge: 15 reps;Limitations Other Supine Lumbar Exercises: B hip IR (pt very ER)    Physical Therapy Assessment and Plan PT Assessment and Plan Clinical Impression Statement: Difficulty communicating with patient due to difficulty understanding therapist.  Pt voiced wanting to focus on increasing his activity tolerance; only able to complete 3 minutes of ambulation (approx 100 feet) before needing rest; Unable to get above 50 rpm on nustep with goal being closer to 100 rpm with frequent rest breaks.  Discussed increasing activity tolerance at home with patient and spouse;  encouraged completing HEP as spouse reports he is not doing at home.   PT Plan: Continue with focus on increasing activity tolerance working up to 10 minute ambulation time without rest.     Problem List Patient Active Problem List   Diagnosis Date Noted  . Bilateral leg weakness 05/01/2013  . Postural imbalance 05/01/2013  . Acute bronchitis 03/01/2013  . Routine general medical examination at a health care facility 11/06/2012  . Laceration of finger of left hand 10/12/2012  . Nocturia 07/10/2012  . Difficulty walking  06/12/2012  . Difficulty in walking 06/12/2012  . Abnormal gait 06/12/2012  . Lower extremity weakness 05/31/2012  . GERD (gastroesophageal reflux disease) 05/09/2012  . Change in bowel habits 04/19/2012  . Back pain, chronic 04/19/2012  . Dermatomycosis 12/29/2011  . Alzheimer's dementia 11/17/2011  . Malignant melanoma 03/03/2011  . BACK PAIN 02/18/2010  . GEN OSTEOARTHROSIS INVOLVING MULTIPLE SITES 11/25/2009  . CENTRAL HEARING LOSS 11/10/2009  . CARDIAC PACEMAKER IN SITU 07/22/2009  . INTERSTITIAL CYSTITIS 07/14/2009  . Pancytopenia 12/31/2008  . MITRAL REGURGITATION, MODERATE 12/31/2008  . CHRONIC SYSTOLIC HEART FAILURE 12/31/2008  . FATIGUE 12/05/2008  . SYNCOPE 05/03/2008  . ERECTILE DYSFUNCTION 01/31/2008  . GLAUCOMA 01/31/2008  . HYPERTENSION 01/31/2008      Lurena Nida, PTA/CLT 05/29/2013, 11:58 AM

## 2013-05-31 ENCOUNTER — Ambulatory Visit (HOSPITAL_COMMUNITY)
Admission: RE | Admit: 2013-05-31 | Discharge: 2013-05-31 | Disposition: A | Discharge status: Home / Self Care | Payer: Medicare Other | Source: Ambulatory Visit | Attending: Family Medicine | Admitting: Family Medicine

## 2013-05-31 DIAGNOSIS — R293 Abnormal posture: Secondary | ICD-10-CM

## 2013-05-31 DIAGNOSIS — R29898 Other symptoms and signs involving the musculoskeletal system: Secondary | ICD-10-CM

## 2013-05-31 NOTE — Progress Notes (Signed)
Physical Therapy Treatment Patient Details  Name: [name] MRN: [id number] Date of Birth: 1926-07-22  Today's Date: 05/31/2013 Time: 1110-1200 PT Time Calculation (min): 50 min Charge there ex 37; there activity 8 Visit#: 9 of 12  Re-eval: 05/31/13    Authorization: medicare  Authorization Time Period:    Authorization Visit#:   of     Subjective: Symptoms/Limitations Symptoms: Pt states he has been doing his exercises twice a day   Precautions/Restrictions     Exercise/Treatments  Stationary Bike: Nustep L1 x 8' Machines for Strengthening   Standing Heel Raises: 10 reps Other Standing Lumbar Exercises: marching in place x 10 Seated Long Arc Quad on Chair: Strengthening;Both;Weights LAQ on Chair Weights (lbs): 4 Supine Bridge: 10 reps Other Supine Lumbar Exercises: B hip isometic for hip ab/adduction x 10 Sidelying Hip Abduction: 10 reps Prone  Other Prone Lumbar Exercises: heel squeeze, hip ext with knee flexed x 10 B Other Prone Lumbar Exercises: knee flex Lt 2#; Rt  0#       Physical Therapy Assessment and Plan PT Assessment and Plan Clinical Impression Statement: PT has imporved focus during therapy today.  Pt began prone exercises to strengthen hamstring and hip extension mm. Pt remains   very weak with decreased activiy tolerance PT Plan: Continue with focus on increasing activity tolerance working up to 10 minute ambulation time without rest.    Goals  progressing  Problem List Patient Active Problem List   Diagnosis Date Noted  . Bilateral leg weakness 05/01/2013  . Postural imbalance 05/01/2013  . Acute bronchitis 03/01/2013  . Routine general medical examination at a health care facility 11/06/2012  . Laceration of finger of left hand 10/12/2012  . Nocturia 07/10/2012  . Difficulty walking 06/12/2012  . Difficulty in walking 06/12/2012  . Abnormal gait 06/12/2012  . Lower extremity weakness 05/31/2012  . GERD (gastroesophageal reflux  disease) 05/09/2012  . Change in bowel habits 04/19/2012  . Back pain, chronic 04/19/2012  . Dermatomycosis 12/29/2011  . Alzheimer's dementia 11/17/2011  . Malignant melanoma 03/03/2011  . BACK PAIN 02/18/2010  . GEN OSTEOARTHROSIS INVOLVING MULTIPLE SITES 11/25/2009  . CENTRAL HEARING LOSS 11/10/2009  . CARDIAC PACEMAKER IN SITU 07/22/2009  . INTERSTITIAL CYSTITIS 07/14/2009  . Pancytopenia 12/31/2008  . MITRAL REGURGITATION, MODERATE 12/31/2008  . CHRONIC SYSTOLIC HEART FAILURE 12/31/2008  . FATIGUE 12/05/2008  . SYNCOPE 05/03/2008  . ERECTILE DYSFUNCTION 01/31/2008  . GLAUCOMA 01/31/2008  . HYPERTENSION 01/31/2008    PT - End of Session Activity Tolerance: Patient tolerated treatment well  GP    [name],Brian 05/31/2013, 12:04 PM

## 2013-06-05 ENCOUNTER — Ambulatory Visit (HOSPITAL_COMMUNITY)
Admission: RE | Admit: 2013-06-05 | Discharge: 2013-06-05 | Disposition: A | Discharge status: Home / Self Care | Payer: Medicare Other | Source: Ambulatory Visit | Attending: Family Medicine | Admitting: Family Medicine

## 2013-06-05 DIAGNOSIS — R262 Difficulty in walking, not elsewhere classified: Secondary | ICD-10-CM | POA: Diagnosis not present

## 2013-06-05 DIAGNOSIS — IMO0001 Reserved for inherently not codable concepts without codable children: Secondary | ICD-10-CM | POA: Insufficient documentation

## 2013-06-05 DIAGNOSIS — M549 Dorsalgia, unspecified: Secondary | ICD-10-CM | POA: Insufficient documentation

## 2013-06-05 DIAGNOSIS — R269 Unspecified abnormalities of gait and mobility: Secondary | ICD-10-CM | POA: Insufficient documentation

## 2013-06-05 DIAGNOSIS — R29898 Other symptoms and signs involving the musculoskeletal system: Secondary | ICD-10-CM | POA: Diagnosis not present

## 2013-06-05 DIAGNOSIS — R293 Abnormal posture: Secondary | ICD-10-CM

## 2013-06-05 DIAGNOSIS — I10 Essential (primary) hypertension: Secondary | ICD-10-CM | POA: Diagnosis not present

## 2013-06-05 NOTE — Evaluation (Addendum)
Physical Therapy Re-Evaluation  Patient Details  Name: [name] MRN: [id number] Date of Birth: [birth year]  Today's Date: 06/05/2013 Time: 1350-1430 PT Time Calculation (min): 40 min Charge: MM test, self care, gt.             Visit#: 10 of 10  Re-eval: 05/31/13 Assessment Diagnosis: back pain Prior Therapy: none  Authorization: medicare      Authorization Visit#: 9 of 0   Past Medical History:  Past Medical History  Diagnosis Date  . Chronic systolic heart failure   . Mitral regurgitation   . Bradycardia   . Syncope   . Screening for lipoid disorders   . Fatigue   . Rash and other nonspecific skin eruption   . Costochondritis, acute   . Glaucoma   . Erectile dysfunction   . Visual changes   . Cancer   . Melanoma in situ 2011  . Pancytopenia   . Pancytopenia    Past Surgical History:  Past Surgical History  Procedure Laterality Date  . Back surgery      x3 most recent   . Ingruinal herniopathy bilateral in teens    . Interstim  placed for intestitial cystitis    . Tonsillectomy around  1950  . Surgery to left elbow  2002  . Cataract extraction, bilateral  left in 1997 right in 2003  . Pacemaker insertion  2010  . Left heel (cancerous area removed)  Jan 2012    Subjective Symptoms/Limitations How long can you stand comfortably?: Pt is able to stand for 5 minutes now was 3 How long can you walk comfortably?: able to walk for five minutes gait is slow and shuffled.  Was five minutes  Cognition/Observation Cognition Overall Cognitive Status: Within Functional Limits for tasks assessed   Assessment RLE Strength Right Hip Flexion: 3-/5 (was 2/5) Right Hip Extension: 2/5 (was 2/5) Right Hip ABduction: 2/5 (was 2/5) Right Knee Flexion: 4/5 Right Knee Extension:  (4/5 was 5/5) Right Ankle Dorsiflexion: 3+/5 (was 3+) LLE Strength Left Hip Flexion: 3-/5 (was 2/5) Left Hip Extension: 2/5 (was 2/5) Left Hip ABduction: 2+/5 (was 2/5) Left Knee Flexion:  4/5 Left Knee Extension:  (4+/5 was 5/5) Left Ankle Dorsiflexion: 3+/5 (was 3+) Lumbar AROM Lumbar Flexion: decreased 70% (was decreased 70%) Lumbar Extension: decreased 100% (was decreased 90%) Lumbar - Right Side Bend: decreased 80 % (was decreased 80%) Lumbar - Left Side Bend: decreased 90% (was decreased 90%) Lumbar - Right Rotation: decreased 60%% (was decreased 80%) Lumbar - Left Rotation: decreased 80% Palpation Palpation:  (tight mm lumbar and LE)  Exercise/Treatments Mobility/Balance  Ambulation/Gait Ambulation/Gait: Yes Ambulation/Gait Assistance: 5: Supervision Ambulation/Gait Assistance Details:  (unsteady) Ambulation Distance (Feet): 5 Feet Assistive device: Straight cane Gait Pattern: Decreased step length - right;Decreased step length - left Gait velocity: slow Posture/Postural Control Posture/Postural Control: Postural limitations Postural Limitations: Pt stands and walks with knees  and hips flexed     Pt explained the importance of walking and exercising.  Physical Therapy Assessment and Plan PT Assessment and Plan Clinical Impression Statement: Pt has not made significant gains.  Pt admits to not completing HEP stating that he does not feel that he will benefit due to having three back operations.  PT urged to complete HEP PT Plan: discharge secondary to minimal functional improvement.    Goals PT Short Term Goals PT Short Term Goal 1: Pt to be able to stand for 10 minutes to make a sandwich PT Short Term Goal 2: Pt to  be ambulating for 10 minute at a time to improve general health PT Short Term Goal 2 - Progress: Not met PT Short Term Goal 3: Back pain to be no greater than a 5/10 80% of the day PT Short Term Goal 3 - Progress: Met PT Short Term Goal 4: no radicular sx PT Short Term Goal 4 - Progress: Met PT Long Term Goals PT Long Term Goal 1: LE strength to be improved one grade to allow the below to occur PT Long Term Goal 1 - Progress: Partly  met PT Long Term Goal 2: Pt to be able to stand for 15 minutes to complete yard activity PT Long Term Goal 2 - Progress: Not met Long Term Goal 3: Pt to be able to walk for 30 minutes to be able to go to the store Long Term Goal 3 Progress: Not met Long Term Goal 4: Pt to no longer have a shuffling gait Long Term Goal 4 Progress: Not met PT Long Term Goal 5: Pt pain to be no greateer than a 3/10 80% of the day  Long Term Goal 5 Progress: Met  Problem List Patient Active Problem List   Diagnosis Date Noted  . Bilateral leg weakness 05/01/2013  . Postural imbalance 05/01/2013  . Acute bronchitis 03/01/2013  . Routine general medical examination at a health care facility 11/06/2012  . Laceration of finger of left hand 10/12/2012  . Nocturia 07/10/2012  . Difficulty walking 06/12/2012  . Difficulty in walking 06/12/2012  . Abnormal gait 06/12/2012  . Lower extremity weakness 05/31/2012  . GERD (gastroesophageal reflux disease) 05/09/2012  . Change in bowel habits 04/19/2012  . Back pain, chronic 04/19/2012  . Dermatomycosis 12/29/2011  . Alzheimer's dementia 11/17/2011  . Malignant melanoma 03/03/2011  . BACK PAIN 02/18/2010  . GEN OSTEOARTHROSIS INVOLVING MULTIPLE SITES 11/25/2009  . CENTRAL HEARING LOSS 11/10/2009  . CARDIAC PACEMAKER IN SITU 07/22/2009  . INTERSTITIAL CYSTITIS 07/14/2009  . Pancytopenia 12/31/2008  . MITRAL REGURGITATION, MODERATE 12/31/2008  . CHRONIC SYSTOLIC HEART FAILURE 12/31/2008  . FATIGUE 12/05/2008  . SYNCOPE 05/03/2008  . ERECTILE DYSFUNCTION 01/31/2008  . GLAUCOMA 01/31/2008  . HYPERTENSION 01/31/2008    PT - End of Session Activity Tolerance: Patient tolerated treatment well General Behavior During Therapy: WFL for tasks assessed/performed  GP Functional Assessment Tool Used: clinical judgement- pt unable to understand Oswestry Functional Limitation: Mobility: Walking and moving around Mobility: Walking and Moving Around Goal Status  [phone]): At least 20 percent but less than 39 percent impaired, limited or restricted Mobility: Walking and Moving Around Discharge Status [phone]): At least 60 percent but less than 80 percent impaired, limited or restricted  Erika Hussar,CINDY 06/05/2013, 2:35 PM  Physician Documentation Your signature is required to indicate approval of the treatment plan as stated above.  Please sign and either send electronically or make a copy of this report for your files and return this physician signed original.   Please mark one 1.__approve of plan  2. ___approve of plan with the following conditions.   ______________________________                                                          _____________________ Physician Signature  Date  

## 2013-06-07 DIAGNOSIS — M25519 Pain in unspecified shoulder: Secondary | ICD-10-CM | POA: Diagnosis not present

## 2013-06-07 DIAGNOSIS — M25579 Pain in unspecified ankle and joints of unspecified foot: Secondary | ICD-10-CM | POA: Diagnosis not present

## 2013-06-07 DIAGNOSIS — M25569 Pain in unspecified knee: Secondary | ICD-10-CM | POA: Diagnosis not present

## 2013-06-07 DIAGNOSIS — M1A00X0 Idiopathic chronic gout, unspecified site, without tophus (tophi): Secondary | ICD-10-CM | POA: Diagnosis not present

## 2013-06-07 DIAGNOSIS — S93409A Sprain of unspecified ligament of unspecified ankle, initial encounter: Secondary | ICD-10-CM | POA: Diagnosis not present

## 2013-06-19 ENCOUNTER — Ambulatory Visit (INDEPENDENT_AMBULATORY_CARE_PROVIDER_SITE_OTHER): Payer: Medicare Other | Admitting: Family Medicine

## 2013-06-19 ENCOUNTER — Encounter: Payer: Self-pay | Admitting: Family Medicine

## 2013-06-19 VITALS — BP 124/82 | HR 58 | Resp 16 | Wt 161.1 lb

## 2013-06-19 DIAGNOSIS — Z79899 Other long term (current) drug therapy: Secondary | ICD-10-CM

## 2013-06-19 DIAGNOSIS — Z23 Encounter for immunization: Secondary | ICD-10-CM

## 2013-06-19 DIAGNOSIS — R5381 Other malaise: Secondary | ICD-10-CM

## 2013-06-19 DIAGNOSIS — R195 Other fecal abnormalities: Secondary | ICD-10-CM

## 2013-06-19 DIAGNOSIS — D61818 Other pancytopenia: Secondary | ICD-10-CM

## 2013-06-19 DIAGNOSIS — Z1211 Encounter for screening for malignant neoplasm of colon: Secondary | ICD-10-CM

## 2013-06-19 DIAGNOSIS — I10 Essential (primary) hypertension: Secondary | ICD-10-CM

## 2013-06-19 DIAGNOSIS — F0280 Dementia in other diseases classified elsewhere without behavioral disturbance: Secondary | ICD-10-CM

## 2013-06-19 DIAGNOSIS — M549 Dorsalgia, unspecified: Secondary | ICD-10-CM

## 2013-06-19 DIAGNOSIS — I5022 Chronic systolic (congestive) heart failure: Secondary | ICD-10-CM

## 2013-06-19 DIAGNOSIS — G8929 Other chronic pain: Secondary | ICD-10-CM

## 2013-06-19 LAB — CBC WITH DIFFERENTIAL/PLATELET
Hemoglobin: 12.4 g/dL — ABNORMAL LOW (ref 13.0–17.0)
Lymphocytes Relative: 32 % (ref 12–46)
Lymphs Abs: 0.6 10*3/uL — ABNORMAL LOW (ref 0.7–4.0)
MCH: 30.8 pg (ref 26.0–34.0)
Monocytes Relative: 11 % (ref 3–12)
Neutro Abs: 1.1 10*3/uL — ABNORMAL LOW (ref 1.7–7.7)
Neutrophils Relative %: 52 % (ref 43–77)
Platelets: 97 10*3/uL — ABNORMAL LOW (ref 150–400)
RBC: 4.02 MIL/uL — ABNORMAL LOW (ref 4.22–5.81)
WBC: 2 10*3/uL — ABNORMAL LOW (ref 4.0–10.5)

## 2013-06-19 LAB — COMPREHENSIVE METABOLIC PANEL
ALT: 13 U/L (ref 0–53)
Albumin: 3.9 g/dL (ref 3.5–5.2)
CO2: 33 mEq/L — ABNORMAL HIGH (ref 19–32)
Calcium: 9.1 mg/dL (ref 8.4–10.5)
Chloride: 105 mEq/L (ref 96–112)
Glucose, Bld: 84 mg/dL (ref 70–99)
Potassium: 4 mEq/L (ref 3.5–5.3)
Sodium: 141 mEq/L (ref 135–145)
Total Bilirubin: 1.1 mg/dL (ref 0.3–1.2)
Total Protein: 6.3 g/dL (ref 6.0–8.3)

## 2013-06-19 LAB — HEMOCCULT GUIAC POC 1CARD (OFFICE)

## 2013-06-19 LAB — LIPID PANEL
Cholesterol: 179 mg/dL (ref 0–200)
Triglycerides: 66 mg/dL (ref <150)
VLDL: 13 mg/dL (ref 0–40)

## 2013-06-19 NOTE — Progress Notes (Signed)
  Subjective:    Patient ID: [name], male    DOB: 04-Feb-1926, 79 y.o.   MRN: [id number]  HPI The PT is here for follow up and re-evaluation of chronic medical conditions, medication management and review of any available recent lab and radiology data.  Preventive health is updated, specifically  Cancer screening and Immunization.    The PT denies any adverse reactions to current medications since the last visit.  C/o ongoing bilateral leg pain and numbness, now concerned about circulation Wife continues to express concern that pt is still driving , though , due to poor memory, he often gets lost , even around his home    Review of Systems See HPI Denies recent fever or chills. Denies sinus pressure, nasal congestion, ear pain or sore throat. Denies chest congestion, productive cough or wheezing. Denies chest pains, palpitations and leg swelling Denies abdominal pain, nausea, vomiting,diarrhea or constipation.   Denies dysuria, frequency, hesitancy or incontinence. Chronic uncontrolled joint pain, swelling and limitation in mobility. Denies headaches, seizures, chronic lower extremity  numbness, or tingling. Denies depression, anxiety or insomnia. Denies skin break down or rash.        Objective:   Physical Exam  Patient alert and oriented and in no cardiopulmonary distress.  HEENT: No facial asymmetry, EOMI, no sinus tenderness,  oropharynx pink and moist.  Neck decreased though adequate ROM,  no adenopathy.  Chest: Clear to auscultation bilaterally.  CVS: S1, S2 systolic  murmur, no S3.  ABD: Soft non tender. Bowel sounds normal. Rectal: heme positive stool, no mass Ext: No edema  MS:  Decreased ROM spine, shoulders, hips and knees.  Skin: Intact, no ulcerations or rash noted.  Psych: Good eye contact, normal affect. Memory loss   Moderately severe, not anxious or depressed appearing.  CNS: CN 2-12 intact, power, t normal throughout.       Assessment &  Plan:
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
## 2013-06-22 ENCOUNTER — Ambulatory Visit (INDEPENDENT_AMBULATORY_CARE_PROVIDER_SITE_OTHER): Payer: Medicare Other | Admitting: Urology

## 2013-06-22 DIAGNOSIS — N3010 Interstitial cystitis (chronic) without hematuria: Secondary | ICD-10-CM | POA: Diagnosis not present

## 2013-06-22 DIAGNOSIS — N3644 Muscular disorders of urethra: Secondary | ICD-10-CM

## 2013-06-22 DIAGNOSIS — R351 Nocturia: Secondary | ICD-10-CM

## 2013-06-22 DIAGNOSIS — R350 Frequency of micturition: Secondary | ICD-10-CM | POA: Diagnosis not present

## 2013-06-28 ENCOUNTER — Telehealth: Payer: Self-pay

## 2013-06-28 DIAGNOSIS — M25569 Pain in unspecified knee: Secondary | ICD-10-CM | POA: Diagnosis not present

## 2013-06-28 DIAGNOSIS — M25579 Pain in unspecified ankle and joints of unspecified foot: Secondary | ICD-10-CM | POA: Diagnosis not present

## 2013-06-28 NOTE — Telephone Encounter (Signed)
He is already on baby aspirin and his blood count is low.  I can get formal study of circulation at [facility], to see if he needs to see a vascular specialist, let me know if he wants this. Cold feet may also be due to nerve pain

## 2013-06-28 NOTE — Telephone Encounter (Signed)
Dr Hilda Lias sent [name] over here to get something called in for the circulation in his legs and feet. Has poor circulation below his knees and they stay cold. Please advise

## 2013-06-29 NOTE — Telephone Encounter (Signed)
Called patient and left message.

## 2013-07-02 NOTE — Telephone Encounter (Signed)
plas schedule pt for circulation test let him know appt info, I will write on paper for you to fax as "gets lost" in the system , thanks

## 2013-07-02 NOTE — Telephone Encounter (Signed)
Patient agrees on having vascular study done

## 2013-07-05 ENCOUNTER — Ambulatory Visit (INDEPENDENT_AMBULATORY_CARE_PROVIDER_SITE_OTHER): Payer: Medicare Other | Admitting: Family Medicine

## 2013-07-05 ENCOUNTER — Other Ambulatory Visit: Payer: Self-pay | Admitting: Family Medicine

## 2013-07-05 ENCOUNTER — Ambulatory Visit (HOSPITAL_COMMUNITY)
Admission: RE | Admit: 2013-07-05 | Discharge: 2013-07-05 | Disposition: A | Discharge status: Home / Self Care | Payer: Medicare Other | Source: Ambulatory Visit | Attending: Family Medicine | Admitting: Family Medicine

## 2013-07-05 DIAGNOSIS — I739 Peripheral vascular disease, unspecified: Secondary | ICD-10-CM | POA: Insufficient documentation

## 2013-07-05 DIAGNOSIS — M79609 Pain in unspecified limb: Secondary | ICD-10-CM

## 2013-07-05 DIAGNOSIS — M79604 Pain in right leg: Secondary | ICD-10-CM

## 2013-07-07 DIAGNOSIS — M79604 Pain in right leg: Secondary | ICD-10-CM | POA: Insufficient documentation

## 2013-07-07 NOTE — Assessment & Plan Note (Signed)
Vascular cause eliminated with normal ABI Pain is neurogenic from severe spinal stenosis, trial of low dose gabapentin propsed

## 2013-07-07 NOTE — Progress Notes (Signed)
  Subjective:    Patient ID: [name], male    DOB: December 28, 1925, 79 y.o.   MRN: [id number]  HPI No office eval, pt went to have ABI study instead and gabapentin low dose recommended after normal result called   Review of Systems     Objective:   Physical Exam        Assessment & Plan:

## 2013-07-10 ENCOUNTER — Other Ambulatory Visit: Payer: Self-pay

## 2013-07-10 MED ORDER — GABAPENTIN 100 MG PO CAPS: 100.0000 mg | ORAL_CAPSULE | Freq: Every day | ORAL | Status: DC

## 2013-07-22 DIAGNOSIS — R195 Other fecal abnormalities: Secondary | ICD-10-CM | POA: Insufficient documentation

## 2013-07-22 NOTE — Assessment & Plan Note (Signed)
Heme positive stool on exam Refer to Gi for eval Pt to stop NSAID

## 2013-07-22 NOTE — Assessment & Plan Note (Signed)
No current decompensation, continue meds, also followed by cardiology

## 2013-07-22 NOTE — Assessment & Plan Note (Signed)
Apparent increased confusion and worsening memory loss per spouse, pt advised in writing to stop driving, boith he and his wife understand, hopefully one of hhischildren will be able to enforce this. Pt has loss of memory, vision and hearing , unfit to drive

## 2013-07-22 NOTE — Assessment & Plan Note (Signed)
Controlled, no change in medication  

## 2013-07-23 NOTE — Assessment & Plan Note (Signed)
ongoing  worsening pain radiating to lower extremities with numbness

## 2013-07-30 ENCOUNTER — Telehealth: Payer: Self-pay | Admitting: Family Medicine

## 2013-07-30 ENCOUNTER — Other Ambulatory Visit: Payer: Self-pay | Admitting: Family Medicine

## 2013-07-30 DIAGNOSIS — R413 Other amnesia: Secondary | ICD-10-CM

## 2013-07-30 DIAGNOSIS — F329 Major depressive disorder, single episode, unspecified: Secondary | ICD-10-CM
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
## 2013-08-08 ENCOUNTER — Ambulatory Visit (INDEPENDENT_AMBULATORY_CARE_PROVIDER_SITE_OTHER): Payer: Medicare Other | Admitting: Psychology

## 2013-08-08 DIAGNOSIS — F0391 Unspecified dementia with behavioral disturbance: Secondary | ICD-10-CM

## 2013-08-09 ENCOUNTER — Ambulatory Visit (INDEPENDENT_AMBULATORY_CARE_PROVIDER_SITE_OTHER): Payer: Medicare Other | Admitting: Gastroenterology

## 2013-08-09 ENCOUNTER — Encounter (INDEPENDENT_AMBULATORY_CARE_PROVIDER_SITE_OTHER): Payer: Self-pay

## 2013-08-09 ENCOUNTER — Encounter: Payer: Self-pay | Admitting: Gastroenterology

## 2013-08-09 VITALS — BP 109/64 | HR 60 | Temp 97.6°F | Wt 156.0 lb

## 2013-08-09 DIAGNOSIS — F5000 Anorexia nervosa, unspecified: Secondary | ICD-10-CM | POA: Diagnosis not present

## 2013-08-09 DIAGNOSIS — R634 Abnormal weight loss: Secondary | ICD-10-CM

## 2013-08-09 DIAGNOSIS — R195 Other fecal abnormalities: Secondary | ICD-10-CM | POA: Diagnosis not present

## 2013-08-09 MED ORDER — LINACLOTIDE 145 MCG PO CAPS: 145.0000 ug | ORAL_CAPSULE | Freq: Every day | ORAL | Status: DC

## 2013-08-09 MED ORDER — PEG 3350-KCL-NA BICARB-NACL 420 G PO SOLR: 4000.0000 mL | ORAL | Status: DC

## 2013-08-09 NOTE — Patient Instructions (Addendum)
1. Please start Linzess orally daily on empty stomach. We have sent prescription to your pharmacy. Please take voucher to pharmacy to get one month free medications.  2. We have scheduled you for a colonoscopy +/- upper endoscopy with Dr. Jena Gauss. See separate instructions.

## 2013-08-09 NOTE — Progress Notes (Signed)
Primary Care Physician:  Syliva Overman, MD  Primary Gastroenterologist:  Roetta Sessions, MD   Chief Complaint  Patient presents with  . Rectal Bleeding    HPI:  [name] is a 79 y.o. male here for Hemoccult-positive stool detected by his PCP. Patient has a history of pancytopenia reportedly followed by hematology but I do not see any recent records at least in the Epic system. No CT evidence of cirrhosis.  Patient has previously seen Dr. Jena Gauss back in 2007 at time of colonoscopy. He has of [employer] bladder inhibitor which we previously turned off at time of procedure per reps recommendations. Patient had some granularity and erosions of the rectum with benign biopsies previously.  Complains of severe constipation. Lots of gas. Takes various OTC laxatives. May go several days without a bowel movement. Does not take anything regularly. No melena, brbpr. No heartburn, dysphagia, abdominal pain. Good appetite. Some weight loss. Wife is concerned that he does not appear to have a good appetite although the patient denies.  Patient weighed 171 pounds in April and now is 156 pounds  Current Outpatient Prescriptions  Medication Sig Dispense Refill  . amLODipine (NORVASC) 2.5 MG tablet Take 1 tablet (2.5 mg total) by mouth daily.  90 tablet  3  . aspirin EC 81 MG tablet Take 1 tablet (81 mg total) by mouth daily.  150 tablet  2  . diazepam (VALIUM) 2 MG tablet Take 2 mg by mouth at bedtime as needed for anxiety.      . diclofenac sodium (VOLTAREN) 1 % GEL Apply 2 g topically 4 (four) times daily.      Marland Kitchen donepezil (ARICEPT) 5 MG tablet Take 1 tablet (5 mg total) by mouth at bedtime.  30 tablet  2  . dorzolamide-timolol (COSOPT) 22.3-6.8 MG/ML ophthalmic solution       . fentaNYL (DURAGESIC) 50 MCG/HR Place 1 patch (50 mcg total) onto the skin every 3 (three) days.  30 patch  0  . gabapentin (NEURONTIN) 100 MG capsule Take 1 capsule (100 mg total) by mouth at bedtime.  30 capsule  1  .  latanoprost (XALATAN) 0.005 % ophthalmic solution       . Meth-Hyo-M Bl-Na Phos-Ph Sal (URIBEL) 118 MG CAPS Take 1 capsule by mouth every 8 (eight) hours.      . Tamsulosin HCl (FLOMAX) 0.4 MG CAPS Take by mouth.      . Linaclotide (LINZESS) 145 MCG CAPS capsule Take 1 capsule (145 mcg total) by mouth daily.  30 capsule  1  . polyethylene glycol-electrolytes (TRILYTE) 420 G solution Take 4,000 mLs by mouth as directed.  4000 mL  0   No current facility-administered medications for this visit.    Allergies as of 08/09/2013 - Review Complete 08/09/2013  Allergen Reaction Noted  . Codeine  01/31/2008    Past Medical History  Diagnosis Date  . Chronic systolic heart failure   . Mitral regurgitation   . Bradycardia   . Syncope   . Screening for lipoid disorders   . Fatigue   . Rash and other nonspecific skin eruption   . Costochondritis, acute   . Glaucoma   . Erectile dysfunction   . Visual changes   . Cancer   . Melanoma in situ 2011  . Pancytopenia   . Cholelithiasis   . Chronic pancreatitis     Based on CT findings    Past Surgical History  Procedure Laterality Date  . Back surgery  x3 most recent   . Ingruinal herniopathy bilateral in teens    . Interstim  placed for intestitial cystitis    . Tonsillectomy around  1950  . Surgery to left elbow  2002  . Cataract extraction, bilateral  left in 1997 right in 2003  . Pacemaker insertion  2010  . Left heel (cancerous area removed)  Jan 2012  . Colonoscopy  08/18/2006    ZOX:WRUE granularity and erosions of the rectum of uncertain significance biopsied.  A long redundant, but otherwise normal-appearing colon.melanosi coli and minimal inflammation on bx    Family History  Problem Relation Age of Onset  . Dementia Mother   . Throat cancer Father   . Cancer Father     throat  . Lung cancer Sister     History   Social History  . Marital Status: Married    Spouse Name: N/A    Number of Children: N/A  . Years  of Education: N/A   Occupational History  . retired     Social History Main Topics  . Smoking status: Former Games developer  . Smokeless tobacco: Not on file  . Alcohol Use: No  . Drug Use: No  . Sexual Activity: Not on file   Other Topics Concern  . Not on file   Social History Narrative  . No narrative on file      ROS:  General: See history of present illness Eyes: Negative for vision changes.  ENT: Negative for hoarseness, difficulty swallowing , nasal congestion. CV: Negative for chest pain, angina, palpitations, dyspnea on exertion, peripheral edema.  Respiratory: Negative for dyspnea at rest, dyspnea on exertion, cough, sputum, wheezing.  GI: See history of present illness. GU:  Negative for dysuria, hematuria, urinary incontinence, urinary frequency, nocturnal urination. See history of present illness MS: Negative for joint pain, low back pain.  Derm: Negative for rash or itching.  Neuro: Negative for weakness, abnormal sensation, seizure, frequent headaches, positive memory loss, confusion.  Psych: Negative for anxiety, depression, suicidal ideation, hallucinations.  Endo: Negative for unusual weight change.  Heme: Negative for bruising or bleeding. Allergy: Negative for rash or hives.    Physical Examination:  BP 109/64  Pulse 60  Temp(Src) 97.6 F (36.4 C) (Oral)  Wt 156 lb (70.761 kg)   General: Well-nourished, well-developed in no acute distress. Accompanied by wife Head: Normocephalic, atraumatic.   Eyes: Conjunctiva pink, no icterus. Mouth: Oropharyngeal mucosa moist and pink , no lesions erythema or exudate. Neck: Supple without thyromegaly, masses, or lymphadenopathy.  Lungs: Clear to auscultation bilaterally.  Heart: Regular rate and rhythm, no murmurs rubs or gallops.  Abdomen: Bowel sounds are normal, nontender, nondistended, no hepatosplenomegaly or masses, no abdominal bruits or    hernia , no rebound or guarding.   Rectal: Not  performed Extremities: No lower extremity edema. No clubbing or deformities.  Neuro: Alert and oriented x 4 , grossly normal neurologically.  Skin: Warm and dry, no rash or jaundice.   Psych: Alert and cooperative, normal mood and affect.  Labs: Lab Results  Component Value Date   WBC 2.0* 06/19/2013   HGB 12.4* 06/19/2013   HCT 36.9* 06/19/2013   MCV 91.8 06/19/2013   PLT 97* 06/19/2013   Lab Results  Component Value Date   CREATININE 0.94 06/19/2013   BUN 14 06/19/2013   NA 141 06/19/2013   K 4.0 06/19/2013   CL 105 06/19/2013   CO2 33* 06/19/2013   Lab Results  Component Value Date  ALT 13 06/19/2013   AST 17 06/19/2013   ALKPHOS 43 06/19/2013   BILITOT 1.1 06/19/2013   Lab Results  Component Value Date   TSH 1.805 04/25/2013    Imaging Studies: No results found.

## 2013-08-09 NOTE — Addendum Note (Signed)
Encounter addended by: Bella Kennedy, PT on: 08/09/2013 12:58 PM<BR>     Documentation filed: Clinical Notes

## 2013-08-09 NOTE — Addendum Note (Signed)
Encounter addended by: Bella Kennedy, PT on: 08/09/2013 12:47 PM<BR>     Documentation filed: Clinical Notes

## 2013-08-10 ENCOUNTER — Encounter: Payer: Self-pay | Admitting: Gastroenterology

## 2013-08-10 DIAGNOSIS — R300 Dysuria: Secondary | ICD-10-CM | POA: Diagnosis not present

## 2013-08-10 DIAGNOSIS — R3915 Urgency of urination: Secondary | ICD-10-CM | POA: Diagnosis not present

## 2013-08-10 NOTE — Assessment & Plan Note (Signed)
79 year old gentleman who presents with multiple GI issues. Initially found to be Hemoccult positive by his PCP. He has pancytopenia chronically, mild anemia. Previously followed by hematology but no recent followup noted. No evidence of cirrhosis on imaging and 2012. He reports severe constipation but is not taking anything regularly. He also has diminished oral intake according to his wife and subsequent weight loss. Recommend colonoscopy with possible upper endoscopy for further evaluation. He has a history of bladder inhibitor, with last colonoscopy we had to turn off her reps recommendations.  I have discussed the risks, alternatives, benefits with regards to but not limited to the risk of reaction to medication, bleeding, infection, perforation and the patient is agreeable to proceed. Written consent to be obtained.  Start Linzess 140 mcg daily, voucher provided.  Based on TCS/EGD findings, may need to consider pancreatic enzymes given chronic calcific pancreatitis noted on prior CT. Further recommendations to follow.

## 2013-08-13 NOTE — Progress Notes (Signed)
cc'd to pcp 

## 2013-08-15 DIAGNOSIS — Z961 Presence of intraocular lens: Secondary | ICD-10-CM | POA: Diagnosis not present

## 2013-08-15 DIAGNOSIS — H4011X0 Primary open-angle glaucoma, stage unspecified: Secondary | ICD-10-CM | POA: Diagnosis not present

## 2013-08-20 ENCOUNTER — Telehealth: Payer: Self-pay | Admitting: Internal Medicine

## 2013-08-20 DIAGNOSIS — R351 Nocturia: Secondary | ICD-10-CM | POA: Diagnosis not present

## 2013-08-20 NOTE — Telephone Encounter (Signed)
Tried to call pt- LMOM 

## 2013-08-20 NOTE — Telephone Encounter (Signed)
Pt called this morning wanting to know if he needed to take the papers we gave him to the hospital. Patient couldn't tell what papers they were. I don't know if it was for labs or prep instruction sheets he was talking about. I couldn't get much information from patient to answer his questions. I told him once RMR nurse was available that I would get with her to see if we could answer his questions. [phone]

## 2013-08-21 ENCOUNTER — Other Ambulatory Visit: Payer: Self-pay | Admitting: Urology

## 2013-08-22 ENCOUNTER — Encounter (HOSPITAL_BASED_OUTPATIENT_CLINIC_OR_DEPARTMENT_OTHER): Payer: Self-pay | Admitting: *Deleted

## 2013-08-23 ENCOUNTER — Encounter (HOSPITAL_BASED_OUTPATIENT_CLINIC_OR_DEPARTMENT_OTHER): Payer: Self-pay | Admitting: *Deleted

## 2013-08-24 ENCOUNTER — Encounter (HOSPITAL_BASED_OUTPATIENT_CLINIC_OR_DEPARTMENT_OTHER): Payer: Self-pay | Admitting: *Deleted

## 2013-08-24 NOTE — Progress Notes (Signed)
To [facility] at 0930-Istat on arrival-Ekg in epic.Spoke with Dr Council Mechanic regarding Hx and any further pre op needs.Npo after Mn-instructed to take amlodipine,gabapentin that am with small amt water.

## 2013-08-27 NOTE — H&P (Signed)
History of Present Illness   [name]' nocturia has worsened. [name] did some troubleshooting. The battery has run out. I have not see him for a number of months and he was followed by Dr. Annabell [name]. He is now getting up 3-4 times at night, which is worse than his baseline when Interstim is working well. We really talked a lot about this in the past. He has been on numerous medications as noted. When he saw [name] on November 7, she had noted his interstitial cystitis and he takes Diazepam and Uribel for his symptoms. He has had a prior TURP. He has had Interstim revisions.   I had him come in because I wanted to sort through things. I do question whether or not the device was working before the battery ran out, but in retrospect fashion, I do not think I am going to be able to do this.   I went over a revision and replacement of the generator and/or lead. I will speak to [name]. We talked about risk of infection, erosion, pain, and the usual association with Interstim.   Pros, cons, success and failure rates of Interstim were discussed. We talked about the test stimulation (office/operating room) and the second stage procedure. Risks were described but not limited to the risk of persistent, de novo, or worsening incontinence. Risks of pain, bleeding, infection, and neuropathy were discussed. Risk of malfunction, migration, and breakage were discussed. Trouble-shooting, battery life, and the need for explanation and reoperation were discussed. MRI issues were discussed. The patient understands that she might not reach her treatment goal and that she might be worse following Brian.  He does understand that he may not reach his treatment goal because I am suspect that it may have not been working that well, but he really thinks it is worth replacing and we have been through this before.   There is no other modifying factors or associated signs or symptoms. There is no other aggravating or relieving factors.  The symptoms are moderate in severity and persisting.   His urine culture on November 7 was negative.   Review of Systems: No change in bowel or neurologic systems.   His last Interstim revision was in August 2010 and the lead was nicely situated on his left side interfacing the bone table as noted.    Past Medical History Problems  1. History of Arthritis (V13.4) 2. History of Benign prostatic hypertrophy without lower urinary tract symptoms (600.00) 3. History of glaucoma (V12.49) 4. History of Malignant Melanoma Of The Skin (V10.82) 5. History of Murmur (785.2) 6. History of Pyuria (791.9) 7. History of Urinary Tract Infection (V13.02)  Surgical History Problems  1. History of Back Brian 2. History of Foot Brian 3. History of Pacemaker Placement (V53.31) 4. History of Peripheral Nerve Neurostimulator Revision Of Pulse Generator  Current Meds 1. AmLODIPine Besylate 2.5 MG Oral Tablet;  Therapy: 10Mar2013 to Recorded 2. Aspirin 81 MG Oral Tablet;  Therapy: (Recorded:15Aug2014) to Recorded 3. CeleBREX 200 MG Oral Capsule;  Therapy: (Recorded:11Mar2013) to Recorded 4. Cosopt 22.3-6.8 MG/ML Ophthalmic Solution;  Therapy: (Recorded:15Aug2014) to Recorded 5. Diazepam 5 MG Oral Tablet; TAKE 1 TABLET TID;  Therapy: 11Jun2014 to (Last Rx:15Aug2014) Ordered 6. Diazepam 5 MG Oral Tablet; TAKE 1 TABLET TID;  Therapy: 11Jun2014 to (Last Rx:15Aug2014) Ordered 7. Diclofenac Sodium 0.1 % Ophthalmic Solution;  Therapy: (Recorded:15Aug2014) to Recorded 8. Donepezil HCl - 5 MG Oral Tablet;  Therapy: (Recorded:15Aug2014) to Recorded 9. FentaNYL 50 MCG/HR Transdermal Patch 72 Hour;  Therapy: 31Jan2014 to Recorded 10. Gabapentin 100 MG Oral Capsule;   Therapy: 07Oct2014 to Recorded 11. Latanoprost 0.005 % Ophthalmic Solution;   Therapy: 15Feb2013 to Recorded 12. Linzess 145 MCG Oral Capsule;   Therapy: 06Nov2014 to Recorded 13. PEG 3350-KCl-Na Bicarb-NaCl 420 GM Oral Solution  Reconstituted;   Therapy: 06Nov2014 to Recorded 14. PredniSONE (Pak) 5 MG Oral Tablet;   Therapy: (Recorded:15Aug2014) to Recorded 15. Uribel 118 MG Oral Capsule; 1 tab po q 8 hrs prn;   Therapy: 28Nov2011 to (Last Rx:09Dec2013)  Requested for: 09Dec2013 Ordered  Allergies Medication  1. Codeine Derivatives 2. Rivastigmine Tartrate CAPS  Family History Problems  1. Family history of Death In The Family Father 2. Family history of Death In The Family Mother 3. Family history of Family Health Status Number Of Children   2 sons  Social History Problems  1. Caffeine Use   1 cup per day 2. Marital History - Currently Married 3. Never A Smoker 4. Occupation:   retired 5. Denied: Tobacco Use (V15.82)  End of Encounter Meds  Medication Name Instruction  AmLODIPine Besylate 2.5 MG Oral Tablet   Aspirin 81 MG Oral Tablet   CeleBREX 200 MG Oral Capsule   Cosopt 22.3-6.8 MG/ML Ophthalmic Solution (Dorzolamide HCl-Timolol Mal)   Diazepam 5 MG Oral Tablet TAKE 1 TABLET TID  Diazepam 5 MG Oral Tablet TAKE 1 TABLET TID  Diclofenac Sodium 0.1 % Ophthalmic Solution   Donepezil HCl - 5 MG Oral Tablet   FentaNYL 50 MCG/HR Transdermal Patch 72 Hour   Gabapentin 100 MG Oral Capsule   Latanoprost 0.005 % Ophthalmic Solution   Linzess 145 MCG Oral Capsule   PEG 3350-KCl-Na Bicarb-NaCl 420 GM Oral Solution Reconstituted   PredniSONE (Pak) 5 MG Oral Tablet   Uribel 118 MG Oral Capsule 1 tab po q 8 hrs prn   Plan Urinary urgency  1. Follow-up Schedule Brian Office  Follow-up  Status: Complete  Done: 17Nov2014  Discussion/Summary   I will schedule [name] to replace his generator. We will do impedance check at the time of Brian. He may or may not need the lead replaced. Retained tip was noted today as a potential risk factor as well. I will speak to [name] [employer] from [employer].  After a thorough review of the management options for the patient's condition the patient  elected to  proceed with surgical therapy as noted above. We have discussed the potential benefits and risks of the procedure, side effects of the proposed treatment, the likelihood of the patient achieving the goals of the procedure, and any potential problems that might occur during the procedure or recuperation. Informed consent has been obtained.

## 2013-08-28 ENCOUNTER — Ambulatory Visit (HOSPITAL_COMMUNITY): Payer: Medicare Other

## 2013-08-28 ENCOUNTER — Encounter (HOSPITAL_BASED_OUTPATIENT_CLINIC_OR_DEPARTMENT_OTHER): Payer: Self-pay | Admitting: Anesthesiology

## 2013-08-28 ENCOUNTER — Ambulatory Visit (HOSPITAL_BASED_OUTPATIENT_CLINIC_OR_DEPARTMENT_OTHER): Payer: Medicare Other | Admitting: Anesthesiology

## 2013-08-28 ENCOUNTER — Ambulatory Visit (HOSPITAL_COMMUNITY): Payer: Self-pay | Admitting: Psychology

## 2013-08-28 ENCOUNTER — Ambulatory Visit (HOSPITAL_BASED_OUTPATIENT_CLINIC_OR_DEPARTMENT_OTHER)
Admission: RE | Admit: 2013-08-28 | Discharge: 2013-08-28 | Disposition: A | Discharge status: Home / Self Care | Payer: Medicare Other | Source: Ambulatory Visit | Attending: Urology | Admitting: Urology

## 2013-08-28 ENCOUNTER — Encounter (HOSPITAL_BASED_OUTPATIENT_CLINIC_OR_DEPARTMENT_OTHER)
Admission: RE | Disposition: A | Discharge status: Home / Self Care | Payer: Self-pay | Source: Ambulatory Visit | Attending: Urology

## 2013-08-28 ENCOUNTER — Encounter (HOSPITAL_BASED_OUTPATIENT_CLINIC_OR_DEPARTMENT_OTHER): Payer: Medicare Other | Admitting: Anesthesiology

## 2013-08-28 DIAGNOSIS — IMO0002 Reserved for concepts with insufficient information to code with codable children: Secondary | ICD-10-CM | POA: Insufficient documentation

## 2013-08-28 DIAGNOSIS — T85890A Other specified complication of nervous system prosthetic devices, implants and grafts, initial encounter: Secondary | ICD-10-CM | POA: Diagnosis not present

## 2013-08-28 DIAGNOSIS — Z7982 Long term (current) use of aspirin: Secondary | ICD-10-CM | POA: Diagnosis not present

## 2013-08-28 DIAGNOSIS — Z79899 Other long term (current) drug therapy: Secondary | ICD-10-CM | POA: Diagnosis not present

## 2013-08-28 DIAGNOSIS — I10 Essential (primary) hypertension: Secondary | ICD-10-CM | POA: Diagnosis not present

## 2013-08-28 DIAGNOSIS — Z9079 Acquired absence of other genital organ(s): Secondary | ICD-10-CM | POA: Insufficient documentation

## 2013-08-28 DIAGNOSIS — Y831 Surgical operation with implant of artificial internal device as the cause of abnormal reaction of the patient, or of later complication, without mention of misadventure at the time of the procedure: Secondary | ICD-10-CM | POA: Insufficient documentation

## 2013-08-28 DIAGNOSIS — N3941 Urge incontinence: Secondary | ICD-10-CM | POA: Diagnosis not present

## 2013-08-28 DIAGNOSIS — I509 Heart failure, unspecified: Secondary | ICD-10-CM | POA: Insufficient documentation

## 2013-08-28 DIAGNOSIS — Z87891 Personal history of nicotine dependence: Secondary | ICD-10-CM | POA: Insufficient documentation

## 2013-08-28 DIAGNOSIS — K219 Gastro-esophageal reflux disease without esophagitis: Secondary | ICD-10-CM | POA: Insufficient documentation

## 2013-08-28 DIAGNOSIS — N3010 Interstitial cystitis (chronic) without hematuria: Secondary | ICD-10-CM | POA: Diagnosis not present

## 2013-08-28 DIAGNOSIS — T85695A Other mechanical complication of other nervous system device, implant or graft, initial encounter: Secondary | ICD-10-CM | POA: Insufficient documentation

## 2013-08-28 DIAGNOSIS — R350 Frequency of micturition: Secondary | ICD-10-CM | POA: Diagnosis not present

## 2013-08-28 DIAGNOSIS — R32 Unspecified urinary incontinence: Secondary | ICD-10-CM | POA: Diagnosis not present

## 2013-08-28 DIAGNOSIS — I739 Peripheral vascular disease, unspecified: Secondary | ICD-10-CM | POA: Diagnosis not present

## 2013-08-28 DIAGNOSIS — Z950 Presence of cardiac pacemaker: Secondary | ICD-10-CM | POA: Diagnosis not present

## 2013-08-28 DIAGNOSIS — R351 Nocturia: Secondary | ICD-10-CM | POA: Diagnosis not present

## 2013-08-28 HISTORY — DX: Nonrheumatic mitral (valve) insufficiency: I34.0

## 2013-08-28 HISTORY — DX: Personal history of other specified conditions: Z87.898

## 2013-08-28 HISTORY — DX: Other chronic pain: G89.29

## 2013-08-28 HISTORY — DX: Atrioventricular block, complete: I44.2

## 2013-08-28 HISTORY — DX: Urge incontinence: N39.41

## 2013-08-28 HISTORY — DX: Benign prostatic hyperplasia without lower urinary tract symptoms: N40.0

## 2013-08-28 HISTORY — DX: Other cardiomyopathies: I42.8

## 2013-08-28 HISTORY — PX: INTERSTIM IMPLANT REVISION: SHX5138

## 2013-08-28 HISTORY — DX: Presence of cardiac pacemaker: Z95.0

## 2013-08-28 HISTORY — DX: Dorsalgia, unspecified: M54.9

## 2013-08-28 HISTORY — DX: Personal history of melanoma in-situ: Z86.006

## 2013-08-28 HISTORY — DX: Interstitial cystitis (chronic) without hematuria: N30.10

## 2013-08-28 HISTORY — DX: Essential (primary) hypertension: I10

## 2013-08-28 HISTORY — DX: Occlusion and stenosis of unspecified carotid artery: I65.29

## 2013-08-28 LAB — POCT I-STAT 4, (NA,K, GLUC, HGB,HCT)
Hemoglobin: 11.2 g/dL — ABNORMAL LOW (ref 13.0–17.0)
Potassium: 3.8 mEq/L (ref 3.5–5.1)

## 2013-08-28 SURGERY — REVISION, SACRAL NERVE STIMULATOR, INTERSTIM
Anesthesia: Monitor Anesthesia Care | Site: Hip | Wound class: Clean

## 2013-08-28 MED ORDER — STERILE WATER FOR IRRIGATION IR SOLN
Status: DC | PRN
  Administered 2013-08-28: 500 mL

## 2013-08-28 MED ORDER — FENTANYL CITRATE 0.05 MG/ML IJ SOLN
25.0000 ug | INTRAMUSCULAR | Status: DC | PRN
  Filled 2013-08-28: qty 1

## 2013-08-28 MED ORDER — PROPOFOL 10 MG/ML IV EMUL
INTRAVENOUS | Status: DC | PRN
  Administered 2013-08-28: 25 ug/kg/min via INTRAVENOUS

## 2013-08-28 MED ORDER — OXYCODONE-ACETAMINOPHEN 10-650 MG PO TABS: 1.0000 | ORAL_TABLET | Freq: Four times a day (QID) | ORAL | Status: DC | PRN

## 2013-08-28 MED ORDER — FENTANYL CITRATE 0.05 MG/ML IJ SOLN
INTRAMUSCULAR | Status: DC | PRN
  Administered 2013-08-28: 25 ug via INTRAVENOUS
  Administered 2013-08-28: 50 ug via INTRAVENOUS
  Administered 2013-08-28: 25 ug via INTRAVENOUS

## 2013-08-28 MED ORDER — CEFAZOLIN SODIUM-DEXTROSE 2-3 GM-% IV SOLR
2.0000 g | INTRAVENOUS | Status: AC
  Administered 2013-08-28: 2 g via INTRAVENOUS
  Filled 2013-08-28: qty 50

## 2013-08-28 MED ORDER — CEPHALEXIN 250 MG PO CAPS: 500.0000 mg | ORAL_CAPSULE | Freq: Three times a day (TID) | ORAL | Status: DC

## 2013-08-28 MED ORDER — CEFAZOLIN SODIUM 1-5 GM-% IV SOLN
1.0000 g | INTRAVENOUS | Status: DC
  Filled 2013-08-28: qty 50

## 2013-08-28 MED ORDER — LACTATED RINGERS IV SOLN
INTRAVENOUS | Status: DC
  Administered 2013-08-28: 11:00:00 via INTRAVENOUS
  Filled 2013-08-28: qty 1000

## 2013-08-28 MED ORDER — FENTANYL CITRATE 0.05 MG/ML IJ SOLN
INTRAMUSCULAR | Status: AC
  Filled 2013-08-28: qty 4

## 2013-08-28 MED ORDER — PROPOFOL 10 MG/ML IV BOLUS
INTRAVENOUS | Status: DC | PRN
  Administered 2013-08-28: 30 mg via INTRAVENOUS

## 2013-08-28 SURGICAL SUPPLY — 51 items
ADH SKN CLS APL DERMABOND .7 (GAUZE/BANDAGES/DRESSINGS) ×1
ANTNA NRSTM XTRN TELEM NS LF (UROLOGICAL SUPPLIES) ×1
APL SKNCLS STERI-STRIP NONHPOA (GAUZE/BANDAGES/DRESSINGS)
BAG URINE LEG 500ML (DRAIN) IMPLANT
BANDAGE ADHESIVE 1X3 (GAUZE/BANDAGES/DRESSINGS) IMPLANT
BENZOIN TINCTURE PRP APPL 2/3 (GAUZE/BANDAGES/DRESSINGS) IMPLANT
BLADE HEX COATED 2.75 (ELECTRODE) ×2 IMPLANT
BLADE SURG 15 STRL LF DISP TIS (BLADE) ×1 IMPLANT
BLADE SURG 15 STRL SS (BLADE) ×2
CATH FOLEY 2WAY SLVR  5CC 16FR (CATHETERS) ×1
CATH FOLEY 2WAY SLVR 5CC 16FR (CATHETERS) IMPLANT
CLOTH BEACON ORANGE TIMEOUT ST (SAFETY) ×2 IMPLANT
COVER MAYO STAND STRL (DRAPES) ×2 IMPLANT
COVER PROBE W GEL 5X96 (DRAPES) ×2 IMPLANT
COVER TABLE BACK 60X90 (DRAPES) ×2 IMPLANT
DERMABOND ADVANCED (GAUZE/BANDAGES/DRESSINGS) ×1
DERMABOND ADVANCED .7 DNX12 (GAUZE/BANDAGES/DRESSINGS) ×1 IMPLANT
DRAPE C-ARM 42X72 X-RAY (DRAPES) ×2 IMPLANT
DRAPE INCISE 23X17 IOBAN STRL (DRAPES) ×1
DRAPE INCISE 23X17 STRL (DRAPES) ×1 IMPLANT
DRAPE INCISE IOBAN 23X17 STRL (DRAPES) ×1 IMPLANT
DRAPE LAPAROSCOPIC ABDOMINAL (DRAPES) ×2 IMPLANT
DRESSING TELFA 8X3 (GAUZE/BANDAGES/DRESSINGS) ×2 IMPLANT
DRSG TEGADERM 2-3/8X2-3/4 SM (GAUZE/BANDAGES/DRESSINGS) ×1 IMPLANT
DRSG TEGADERM 4X4.75 (GAUZE/BANDAGES/DRESSINGS) ×2 IMPLANT
ELECT REM PT RETURN 9FT ADLT (ELECTROSURGICAL) ×2
ELECTRODE REM PT RTRN 9FT ADLT (ELECTROSURGICAL) IMPLANT
GAUZE SPONGE 4X4 12PLY STRL LF (GAUZE/BANDAGES/DRESSINGS) ×1 IMPLANT
GLOVE BIO SURGEON STRL SZ 6.5 (GLOVE) ×1 IMPLANT
GLOVE BIO SURGEON STRL SZ7.5 (GLOVE) ×4 IMPLANT
GLOVE INDICATOR 6.5 STRL GRN (GLOVE) ×1 IMPLANT
GOWN PREVENTION PLUS LG XLONG (DISPOSABLE) ×2 IMPLANT
GOWN STRL REIN XL XLG (GOWN DISPOSABLE) ×2 IMPLANT
INTRODUCER GUIDE DILATR SHEATH (SET/KITS/TRAYS/PACK) ×1 IMPLANT
LEAD (Lead) ×1 IMPLANT
NEEDLE FORAMEN 20GA 3.5  9CM (NEEDLE) IMPLANT
NEEDLE FORAMEN 20GA 5  12.5CM (NEEDLE) IMPLANT
NEEDLE HYPO 22GX1.5 SAFETY (NEEDLE) IMPLANT
PACK BASIN DAY SURGERY FS (CUSTOM PROCEDURE TRAY) ×2 IMPLANT
PENCIL BUTTON HOLSTER BLD 10FT (ELECTRODE) IMPLANT
PROGRAMMER ANTENNA EXT (UROLOGICAL SUPPLIES) ×2 IMPLANT
PROGRAMMER STIMUL 2.2X1.1X3.7 (UROLOGICAL SUPPLIES) ×2 IMPLANT
STAPLER VISISTAT 35W (STAPLE) IMPLANT
STIMULATOR INTERSTIM 2X1.7X.3 (Orthopedic Implant) ×2 IMPLANT
STRIP CLOSURE SKIN 1/2X4 (GAUZE/BANDAGES/DRESSINGS) ×2 IMPLANT
SUT VIC AB 3-0 SH 27 (SUTURE) ×6
SUT VIC AB 3-0 SH 27X BRD (SUTURE) ×1 IMPLANT
SUT VICRYL 4-0 PS2 18IN ABS (SUTURE) ×4 IMPLANT
SYR BULB IRRIGATION 50ML (SYRINGE) ×2 IMPLANT
SYR CONTROL 10ML LL (SYRINGE) IMPLANT
TOWEL OR 17X24 6PK STRL BLUE (TOWEL DISPOSABLE) ×4 IMPLANT

## 2013-08-28 NOTE — Interval H&P Note (Signed)
History and Physical Interval Note:  08/28/2013 7:17 AM  [name]  has presented today for surgery, with the diagnosis of URGE INCONTINENCE MALFUNCTIONING INTERSTIM    The various methods of treatment have been discussed with the patient and family. After consideration of risks, benefits and other options for treatment, the patient has consented to  Procedure(s): REPLACEMENT OF INTERSTIM-GENERATOR AND POSSIBLE LEAD  (N/A) as a surgical intervention .  The patient's history has been reviewed, patient examined, no change in status, stable for surgery.  I have reviewed the patient's chart and labs.  Questions were answered to the patient's satisfaction.     Charmain Diosdado A

## 2013-08-28 NOTE — Anesthesia Preprocedure Evaluation (Addendum)
Anesthesia Evaluation  Patient identified by MRN, date of birth, ID band Patient awake    Reviewed: Allergy & Precautions, H&P , NPO status , Patient's Chart, lab work & pertinent test results  Airway Mallampati: II TM Distance: >3 FB Neck ROM: Limited   Comment: Can move head side to side, but ROM slightly limited. Dental no notable dental hx.    Pulmonary former smoker,  breath sounds clear to auscultation  Pulmonary exam normal       Cardiovascular hypertension, Pt. on medications + Peripheral Vascular Disease and +CHF + dysrhythmias + pacemaker Rhythm:Regular Rate:Normal + Systolic murmurs- Diastolic murmurs ECHO 2010: reviewed. MR, EF normal.  Pacemaker check 05-15-13  3/6 midsystolic murmur   Neuro/Psych PSYCHIATRIC DISORDERS Chronic back pain. negative neurological ROS     GI/Hepatic Neg liver ROS, GERD-  ,H/O chronic pancreatitis in the past.   Endo/Other  negative endocrine ROS  Renal/GU negative Renal ROS  negative genitourinary   Musculoskeletal negative musculoskeletal ROS (+)   Abdominal   Peds negative pediatric ROS (+)  Hematology negative hematology ROS (+) anemia ,   Anesthesia Other Findings   Reproductive/Obstetrics negative OB ROS                         Anesthesia Physical Anesthesia Plan  ASA: III  Anesthesia Plan: MAC   Post-op Pain Management:    Induction: Intravenous  Airway Management Planned:   Additional Equipment:   Intra-op Plan:   Post-operative Plan:   Informed Consent: I have reviewed the patients History and Physical, chart, labs and discussed the procedure including the risks, benefits and alternatives for the proposed anesthesia with the patient or authorized representative who has indicated his/her understanding and acceptance.   Dental advisory given  Plan Discussed with: CRNA  Anesthesia Plan Comments:         Anesthesia Quick  Evaluation

## 2013-08-28 NOTE — Op Note (Signed)
Preoperative diagnosis: Malfunctioning InterStim Postoperative diagnosis: Malfunctioning InterStim Surgery: Replacement and removal of malfunctioning InterStim and impedance check Surgeon: Dr. Lorin Picket Javione Gunawan  The patient has the above diagnoses and consented to the above procedure. Preoperative antibiotics were given. Extra care was taken with skin preparation and positioning of the patient.  His high right buttock incision was easily opened after instilling 10 cc of my lidocaine epinephrine mixture. With cutting current I had the pseudocapsule and delivered the IPG. The screwdriver disconnected the lead from the IPG. Even at high stimulation toe and bellows response were not obtained and therefore I decided to remove the lead  Using fluoroscopy and pulling on the lead I marked the appropriate 2 cm incision in the midline in the area of an old incision and I could see skin dimpling. I could easily dissect down to deliver the lead from lateral to medial.  I spent a lot of time and with sharp and blunt dissection to deliver the lead. I finally placed a hemostat below the marker near the bone table. With gentle removal of the lead unfortunately it stripped and left the tip in situ. I tried to dissect further and could not remove it.  I then spent approximately 20 minutes placing a framing needle through the S3 foramina on the patient's right side but we could never get a good toe or bellows response.  At this stage I decided to move to the patient left side. Lidocaine epinephrine mixture again was utilized at 10 cc. Using fluoroscopy the 3.5 inch framing needle easily was passed through the S3 foramina on the patients left side. He had excellent low power toe and bellows response.  I removed the inner core the foramen passed the guide to the appropriate depth. I made an appropriate incision around the lead. I delivered the trocar to the mid bone table fluoroscopically. I then placed the lead to the  appropriate depth with a floppy tip and there was excellent test turned. He had excellent toe and bellows response at all 4 positions with power of 2-4. Fluoroscopically the lateral and AP views looked great and x-rays were taken  This was done after I removed the wire and trocar.  Using the delivery device the lead was delivered from the patient's left side to the right buttock incision. Pseudocapsule was opened. Lead was attached to IPG. Impedance was checked and impedance was normal in all 4-lead positions with a generator in place.  All incisions were closed with 3-0 Vicryl and 4-0 interrupted or subcuticular sutures sterile dressing was applied  I was very pleased to the procedure and hopefully it'll reach the patient's treatment goal

## 2013-08-28 NOTE — Transfer of Care (Signed)
Immediate Anesthesia Transfer of Care Note  Patient: [name]  Procedure(s) Performed: Procedure(s): REPLACEMENT OF INTERSTIM-GENERATOR AND LEAD  (N/A)  Patient Location: PACU  Anesthesia Type:MAC  Level of Consciousness: awake, alert  and oriented  Airway & Oxygen Therapy: Patient Spontanous Breathing  Post-op Assessment: Report given to PACU RN  Post vital signs: Reviewed and stable  Complications: No apparent anesthesia complications

## 2013-08-28 NOTE — Anesthesia Postprocedure Evaluation (Signed)
  Anesthesia Post-op Note  Patient: [name]  Procedure(s) Performed: Procedure(s) (LRB): REPLACEMENT OF INTERSTIM-GENERATOR AND LEAD  (N/A)  Patient Location: PACU  Anesthesia Type: General  Level of Consciousness: awake and alert   Airway and Oxygen Therapy: Patient Spontanous Breathing  Post-op Pain: mild  Post-op Assessment: Post-op Vital signs reviewed, Patient's Cardiovascular Status Stable, Respiratory Function Stable, Patent Airway and No signs of Nausea or vomiting  Last Vitals:  Filed Vitals:   08/28/13 1400  BP: 146/94  Pulse: 60  Temp:   Resp: 2    Post-op Vital Signs: stable   Complications: No apparent anesthesia complications

## 2013-08-29 ENCOUNTER — Encounter (HOSPITAL_BASED_OUTPATIENT_CLINIC_OR_DEPARTMENT_OTHER): Payer: Self-pay | Admitting: Urology

## 2013-09-03 ENCOUNTER — Encounter: Payer: Self-pay | Admitting: Family Medicine

## 2013-09-03 ENCOUNTER — Ambulatory Visit (INDEPENDENT_AMBULATORY_CARE_PROVIDER_SITE_OTHER): Payer: Medicare Other | Admitting: Family Medicine

## 2013-09-03 ENCOUNTER — Encounter (INDEPENDENT_AMBULATORY_CARE_PROVIDER_SITE_OTHER): Payer: Self-pay

## 2013-09-03 VITALS — BP 142/80 | HR 60 | Resp 18 | Ht 68.0 in | Wt 169.0 lb

## 2013-09-03 DIAGNOSIS — H409 Unspecified glaucoma: Secondary | ICD-10-CM

## 2013-09-03 DIAGNOSIS — F0280 Dementia in other diseases classified elsewhere without behavioral disturbance: Secondary | ICD-10-CM | POA: Diagnosis not present

## 2013-09-03 DIAGNOSIS — I10 Essential (primary) hypertension: Secondary | ICD-10-CM

## 2013-09-03 DIAGNOSIS — N3010 Interstitial cystitis (chronic) without hematuria: Secondary | ICD-10-CM | POA: Diagnosis not present

## 2013-09-03 DIAGNOSIS — K219 Gastro-esophageal reflux disease without esophagitis: Secondary | ICD-10-CM | POA: Diagnosis not present

## 2013-09-03 DIAGNOSIS — M79609 Pain in unspecified limb: Secondary | ICD-10-CM

## 2013-09-03 DIAGNOSIS — M79604 Pain in right leg: Secondary | ICD-10-CM

## 2013-09-03 DIAGNOSIS — M549 Dorsalgia, unspecified: Secondary | ICD-10-CM

## 2013-09-03 MED ORDER — DONEPEZIL HCL 10 MG PO TABS: 10.0000 mg | ORAL_TABLET | Freq: Every day | ORAL | Status: DC

## 2013-09-03 NOTE — Patient Instructions (Addendum)
F/u in 3 months, please call if you need me before  You are referred for dementia testing specifically since you still have concerns about driving safety  Dose increase in aricept when you next fill to treating dose.  I am thankful that your procedure went well.  You need to consider help with some of the things you were able to do.  Please reduce fried and fatty foods, bad cholesterol slightly high  Dr Hilda Lias will need to inject your knee

## 2013-09-03 NOTE — Progress Notes (Signed)
   Subjective:    Patient ID: [name], male    DOB: 19-Nov-1925, 79 y.o.   MRN: [id number]  HPI The PT is here for follow up and re-evaluation of chronic medical conditions, medication management and review of any available recent lab and radiology data.  Preventive health is updated, specifically  Cancer screening and Immunization.   Questions or concerns regarding consultations or procedures which the PT has had in the interim are  Addressed.Recently had replacement of thew stimulator for IC , with no complications The PT denies any adverse reactions to current medications since the last visit.  There are no new concerns.  C/o increased left knee pain esp at back of knee, will need to see ortho for intra articular injection. No recent trauma, flare of symptom in past 1 week Still states he wants to drive, less agitated at this visit.will refer for formal eval of memory, however based on reports from spouse pt is high risk for driving and should not drive, I again re iterate this     Review of Systems See HPI Denies recent fever or chills. Denies sinus pressure, nasal congestion, ear pain or sore throat. Denies chest congestion, productive cough or wheezing. Denies chest pains, palpitations and leg swelling Denies abdominal pain, nausea, vomiting,diarrhea or constipation.    Chronic back and lower extremity pain , with  limitation in mobility. Denies headaches, seizures, numbness, or tingling. Denies uncontrolled depression, anxiety or insomnia. Denies skin break down or rash.        Objective:   Physical Exam Patient alert and oriented and in no cardiopulmonary distress.  HEENT: No facial asymmetry, EOMI, no sinus tenderness,  oropharynx pink and moist.  Neck decreased though adequate ROM,  no adenopathy.No JVD  Chest: Clear to auscultation bilaterally.  CVS: S1, S2 systolic  murmurs, no S3.  ABD: Soft non tender. Bowel sounds normal.  Ext: No edema  MS: decreased   ROM spine, shoulders, hips and knees.  Skin: Intact, no ulcerations or rash noted.  Psych: Good eye contact, normal affect. Memory loss not anxious or depressed appearing.  CNS: CN 2-12 intact, power,  normal throughout.Reduced vision and hearing        Assessment & Plan:

## 2013-09-04 DIAGNOSIS — M25569 Pain in unspecified knee: Secondary | ICD-10-CM | POA: Diagnosis not present

## 2013-09-05 ENCOUNTER — Telehealth: Payer: Self-pay | Admitting: Internal Medicine

## 2013-09-05 NOTE — Telephone Encounter (Signed)
I spoke to Dr. Clint Guy nurse from [facility] and she stated that the Bladder Inhibitor does not need to be turned off durning colonoscopy

## 2013-09-06 ENCOUNTER — Ambulatory Visit (HOSPITAL_COMMUNITY)
Admission: RE | Admit: 2013-09-06 | Discharge: 2013-09-06 | Disposition: A | Discharge status: Home / Self Care | Payer: Medicare Other | Source: Ambulatory Visit | Attending: Internal Medicine | Admitting: Internal Medicine

## 2013-09-06 ENCOUNTER — Encounter (HOSPITAL_COMMUNITY)
Admission: RE | Disposition: A | Discharge status: Home / Self Care | Payer: Medicare Other | Source: Ambulatory Visit | Attending: Internal Medicine

## 2013-09-06 DIAGNOSIS — F5000 Anorexia nervosa, unspecified: Secondary | ICD-10-CM

## 2013-09-06 DIAGNOSIS — R634 Abnormal weight loss: Secondary | ICD-10-CM

## 2013-09-06 DIAGNOSIS — Z538 Procedure and treatment not carried out for other reasons: Secondary | ICD-10-CM | POA: Insufficient documentation

## 2013-09-06 DIAGNOSIS — K5909 Other constipation: Secondary | ICD-10-CM | POA: Diagnosis not present

## 2013-09-06 DIAGNOSIS — R195 Other fecal abnormalities: Secondary | ICD-10-CM

## 2013-09-06 DIAGNOSIS — K921 Melena: Secondary | ICD-10-CM | POA: Diagnosis not present

## 2013-09-06 SURGERY — CANCELLED PROCEDURE

## 2013-09-06 MED ORDER — SODIUM CHLORIDE 0.9 % IV SOLN: INTRAVENOUS | Status: DC

## 2013-09-06 NOTE — OR Nursing (Signed)
Patient scheduled for a TCS with possible EGD. Patient ate a bowl of cereal yesterday morning and a sweet potato at 1700 yesterday prior to drinking his prep. Dr. Jena Gauss notified and said to cancel procedure for today and have patient go by the office to reschedule. Candelaria Stagers at the office notified,patient and wife informed and verbalized understanding.

## 2013-09-09 NOTE — Assessment & Plan Note (Signed)
Continue chronic fentanyl

## 2013-09-09 NOTE — Assessment & Plan Note (Signed)
Increase aricept to treating dose. Pt still requesting that he drive, based on family report I do not believe this is a good idea at all, as I have told hm before Will refer for formal dementia screen

## 2013-09-09 NOTE — Assessment & Plan Note (Signed)
Controlled, no change in medication Followed by GI 

## 2013-09-09 NOTE — Assessment & Plan Note (Signed)
The importance of regular eye exam and compliance with treatment is stressed

## 2013-09-09 NOTE — Assessment & Plan Note (Signed)
Recent replacement of stimulator with no complication, continue close urology surveillance

## 2013-09-09 NOTE — Assessment & Plan Note (Signed)
Controlled, no change in medication  

## 2013-09-09 NOTE — Assessment & Plan Note (Signed)
Related to arthritis in spine, managed with fentanyl patch

## 2013-09-10 DIAGNOSIS — R350 Frequency of micturition: Secondary | ICD-10-CM | POA: Diagnosis not present

## 2013-09-10 DIAGNOSIS — R351 Nocturia: Secondary | ICD-10-CM | POA: Diagnosis not present

## 2013-09-21 ENCOUNTER — Ambulatory Visit (INDEPENDENT_AMBULATORY_CARE_PROVIDER_SITE_OTHER): Payer: Medicare Other | Admitting: Urology

## 2013-09-21 DIAGNOSIS — N3010 Interstitial cystitis (chronic) without hematuria: Secondary | ICD-10-CM | POA: Diagnosis not present

## 2013-09-21 DIAGNOSIS — R3915 Urgency of urination: Secondary | ICD-10-CM | POA: Diagnosis not present

## 2013-09-21 DIAGNOSIS — R351 Nocturia: Secondary | ICD-10-CM

## 2013-10-03 ENCOUNTER — Telehealth: Payer: Self-pay | Admitting: Family Medicine

## 2013-10-05 ENCOUNTER — Telehealth: Payer: Self-pay

## 2013-10-05 DIAGNOSIS — M25579 Pain in unspecified ankle and joints of unspecified foot: Secondary | ICD-10-CM | POA: Diagnosis not present

## 2013-10-05 DIAGNOSIS — M25569 Pain in unspecified knee: Secondary | ICD-10-CM | POA: Diagnosis not present

## 2013-10-05 NOTE — Telephone Encounter (Signed)
pls call pt and see if he still has percocet which was ordered in November, not sure by who. If all done, then erx tramadol 50mg  one daily for pain as needed #30 refill zero  Pls remind him to use the voltaren gel and that he also has a prescription pain patch

## 2013-10-05 NOTE — Telephone Encounter (Signed)
Patient came in office today and I gave him his appointment for next door at [facility]

## 2013-10-05 NOTE — Telephone Encounter (Signed)
Patient is aware 

## 2013-10-08 MED ORDER — TRAMADOL HCL 50 MG PO TABS: 50.0000 mg | ORAL_TABLET | Freq: Every day | ORAL | Status: DC

## 2013-10-08 NOTE — Telephone Encounter (Signed)
Patient out of percocet. Aware we are sending some tramadol and to use his other meds as directed

## 2013-10-09 ENCOUNTER — Ambulatory Visit: Payer: Self-pay | Admitting: Gastroenterology

## 2013-10-09 ENCOUNTER — Ambulatory Visit: Payer: Medicare Other | Admitting: Family Medicine

## 2013-10-13 DIAGNOSIS — F0390 Unspecified dementia without behavioral disturbance: Secondary | ICD-10-CM | POA: Diagnosis not present

## 2013-10-13 DIAGNOSIS — Z9181 History of falling: Secondary | ICD-10-CM | POA: Diagnosis not present

## 2013-10-13 DIAGNOSIS — F0280 Dementia in other diseases classified elsewhere without behavioral disturbance: Secondary | ICD-10-CM | POA: Diagnosis not present

## 2013-10-13 DIAGNOSIS — M25569 Pain in unspecified knee: Secondary | ICD-10-CM | POA: Diagnosis not present

## 2013-10-13 DIAGNOSIS — K219 Gastro-esophageal reflux disease without esophagitis: Secondary | ICD-10-CM | POA: Diagnosis not present

## 2013-10-13 DIAGNOSIS — I10 Essential (primary) hypertension: Secondary | ICD-10-CM | POA: Diagnosis not present

## 2013-10-13 DIAGNOSIS — G309 Alzheimer's disease, unspecified: Secondary | ICD-10-CM | POA: Diagnosis not present

## 2013-10-15 ENCOUNTER — Telehealth: Payer: Self-pay

## 2013-10-15 DIAGNOSIS — F0390 Unspecified dementia without behavioral disturbance: Secondary | ICD-10-CM | POA: Diagnosis not present

## 2013-10-15 DIAGNOSIS — F0280 Dementia in other diseases classified elsewhere without behavioral disturbance: Secondary | ICD-10-CM | POA: Diagnosis not present

## 2013-10-15 DIAGNOSIS — K219 Gastro-esophageal reflux disease without esophagitis: Secondary | ICD-10-CM | POA: Diagnosis not present

## 2013-10-15 DIAGNOSIS — I10 Essential (primary) hypertension: Secondary | ICD-10-CM | POA: Diagnosis not present

## 2013-10-15 DIAGNOSIS — Z9181 History of falling: Secondary | ICD-10-CM | POA: Diagnosis not present

## 2013-10-15 DIAGNOSIS — M25569 Pain in unspecified knee: Secondary | ICD-10-CM | POA: Diagnosis not present

## 2013-10-15 NOTE — Telephone Encounter (Signed)
Gave verbal order to nurse Wyatt Haste to add Occupational and Speech therapy evaluation

## 2013-10-16 ENCOUNTER — Ambulatory Visit (HOSPITAL_COMMUNITY): Payer: Self-pay | Admitting: Psychology

## 2013-10-17 DIAGNOSIS — M25569 Pain in unspecified knee: Secondary | ICD-10-CM | POA: Diagnosis not present

## 2013-10-17 DIAGNOSIS — K219 Gastro-esophageal reflux disease without esophagitis: Secondary | ICD-10-CM | POA: Diagnosis not present

## 2013-10-17 DIAGNOSIS — G309 Alzheimer's disease, unspecified: Secondary | ICD-10-CM | POA: Diagnosis not present

## 2013-10-17 DIAGNOSIS — F0390 Unspecified dementia without behavioral disturbance: Secondary | ICD-10-CM | POA: Diagnosis not present

## 2013-10-17 DIAGNOSIS — I10 Essential (primary) hypertension: Secondary | ICD-10-CM | POA: Diagnosis not present

## 2013-10-17 DIAGNOSIS — Z9181 History of falling: Secondary | ICD-10-CM | POA: Diagnosis not present

## 2013-10-17 DIAGNOSIS — F0280 Dementia in other diseases classified elsewhere without behavioral disturbance: Secondary | ICD-10-CM | POA: Diagnosis not present

## 2013-10-18 DIAGNOSIS — K219 Gastro-esophageal reflux disease without esophagitis: Secondary | ICD-10-CM | POA: Diagnosis not present

## 2013-10-18 DIAGNOSIS — I10 Essential (primary) hypertension: Secondary | ICD-10-CM | POA: Diagnosis not present

## 2013-10-18 DIAGNOSIS — F0390 Unspecified dementia without behavioral disturbance: Secondary | ICD-10-CM | POA: Diagnosis not present

## 2013-10-18 DIAGNOSIS — M25569 Pain in unspecified knee: Secondary | ICD-10-CM | POA: Diagnosis not present

## 2013-10-18 DIAGNOSIS — F0280 Dementia in other diseases classified elsewhere without behavioral disturbance: Secondary | ICD-10-CM | POA: Diagnosis not present

## 2013-10-18 DIAGNOSIS — G309 Alzheimer's disease, unspecified: Secondary | ICD-10-CM | POA: Diagnosis not present

## 2013-10-18 DIAGNOSIS — Z9181 History of falling: Secondary | ICD-10-CM | POA: Diagnosis not present

## 2013-10-19 DIAGNOSIS — I10 Essential (primary) hypertension: Secondary | ICD-10-CM | POA: Diagnosis not present

## 2013-10-19 DIAGNOSIS — Z9181 History of falling: Secondary | ICD-10-CM | POA: Diagnosis not present

## 2013-10-19 DIAGNOSIS — F0280 Dementia in other diseases classified elsewhere without behavioral disturbance: Secondary | ICD-10-CM | POA: Diagnosis not present

## 2013-10-19 DIAGNOSIS — F0390 Unspecified dementia without behavioral disturbance: Secondary | ICD-10-CM | POA: Diagnosis not present

## 2013-10-19 DIAGNOSIS — M25569 Pain in unspecified knee: Secondary | ICD-10-CM | POA: Diagnosis not present

## 2013-10-19 DIAGNOSIS — K219 Gastro-esophageal reflux disease without esophagitis: Secondary | ICD-10-CM | POA: Diagnosis not present

## 2013-10-22 DIAGNOSIS — K219 Gastro-esophageal reflux disease without esophagitis: Secondary | ICD-10-CM | POA: Diagnosis not present

## 2013-10-22 DIAGNOSIS — F0390 Unspecified dementia without behavioral disturbance: Secondary | ICD-10-CM | POA: Diagnosis not present

## 2013-10-22 DIAGNOSIS — I10 Essential (primary) hypertension: Secondary | ICD-10-CM | POA: Diagnosis not present

## 2013-10-22 DIAGNOSIS — Z9181 History of falling: Secondary | ICD-10-CM | POA: Diagnosis not present

## 2013-10-22 DIAGNOSIS — M25569 Pain in unspecified knee: Secondary | ICD-10-CM | POA: Diagnosis not present

## 2013-10-22 DIAGNOSIS — F0280 Dementia in other diseases classified elsewhere without behavioral disturbance: Secondary | ICD-10-CM | POA: Diagnosis not present

## 2013-10-23 DIAGNOSIS — F0390 Unspecified dementia without behavioral disturbance: Secondary | ICD-10-CM | POA: Diagnosis not present

## 2013-10-23 DIAGNOSIS — I10 Essential (primary) hypertension: Secondary | ICD-10-CM | POA: Diagnosis not present

## 2013-10-23 DIAGNOSIS — K219 Gastro-esophageal reflux disease without esophagitis: Secondary | ICD-10-CM | POA: Diagnosis not present

## 2013-10-23 DIAGNOSIS — G309 Alzheimer's disease, unspecified: Secondary | ICD-10-CM | POA: Diagnosis not present

## 2013-10-23 DIAGNOSIS — Z9181 History of falling: Secondary | ICD-10-CM | POA: Diagnosis not present

## 2013-10-23 DIAGNOSIS — M25569 Pain in unspecified knee: Secondary | ICD-10-CM | POA: Diagnosis not present

## 2013-10-23 DIAGNOSIS — F0280 Dementia in other diseases classified elsewhere without behavioral disturbance: Secondary | ICD-10-CM | POA: Diagnosis not present

## 2013-10-24 ENCOUNTER — Ambulatory Visit (HOSPITAL_COMMUNITY): Payer: Medicare Other | Admitting: Psychiatry

## 2013-10-24 DIAGNOSIS — F0390 Unspecified dementia without behavioral disturbance: Secondary | ICD-10-CM | POA: Diagnosis not present

## 2013-10-24 DIAGNOSIS — K219 Gastro-esophageal reflux disease without esophagitis: Secondary | ICD-10-CM | POA: Diagnosis not present

## 2013-10-24 DIAGNOSIS — I10 Essential (primary) hypertension: Secondary | ICD-10-CM | POA: Diagnosis not present

## 2013-10-24 DIAGNOSIS — G309 Alzheimer's disease, unspecified: Secondary | ICD-10-CM | POA: Diagnosis not present

## 2013-10-24 DIAGNOSIS — F0280 Dementia in other diseases classified elsewhere without behavioral disturbance: Secondary | ICD-10-CM | POA: Diagnosis not present

## 2013-10-24 DIAGNOSIS — M25569 Pain in unspecified knee: Secondary | ICD-10-CM | POA: Diagnosis not present

## 2013-10-24 DIAGNOSIS — Z9181 History of falling: Secondary | ICD-10-CM | POA: Diagnosis not present

## 2013-10-25 DIAGNOSIS — H4011X0 Primary open-angle glaucoma, stage unspecified: Secondary | ICD-10-CM | POA: Diagnosis not present

## 2013-10-25 DIAGNOSIS — Z961 Presence of intraocular lens: Secondary | ICD-10-CM | POA: Diagnosis not present

## 2013-10-26 DIAGNOSIS — F0390 Unspecified dementia without behavioral disturbance: Secondary | ICD-10-CM | POA: Diagnosis not present

## 2013-10-26 DIAGNOSIS — K219 Gastro-esophageal reflux disease without esophagitis: Secondary | ICD-10-CM | POA: Diagnosis not present

## 2013-10-26 DIAGNOSIS — F0280 Dementia in other diseases classified elsewhere without behavioral disturbance: Secondary | ICD-10-CM | POA: Diagnosis not present

## 2013-10-26 DIAGNOSIS — I10 Essential (primary) hypertension: Secondary | ICD-10-CM | POA: Diagnosis not present

## 2013-10-26 DIAGNOSIS — M25569 Pain in unspecified knee: Secondary | ICD-10-CM | POA: Diagnosis not present

## 2013-10-26 DIAGNOSIS — Z9181 History of falling: Secondary | ICD-10-CM | POA: Diagnosis not present

## 2013-10-29 DIAGNOSIS — K219 Gastro-esophageal reflux disease without esophagitis: Secondary | ICD-10-CM | POA: Diagnosis not present

## 2013-10-29 DIAGNOSIS — I10 Essential (primary) hypertension: Secondary | ICD-10-CM | POA: Diagnosis not present

## 2013-10-29 DIAGNOSIS — Z9181 History of falling: Secondary | ICD-10-CM | POA: Diagnosis not present

## 2013-10-29 DIAGNOSIS — F0280 Dementia in other diseases classified elsewhere without behavioral disturbance: Secondary | ICD-10-CM | POA: Diagnosis not present

## 2013-10-29 DIAGNOSIS — F0390 Unspecified dementia without behavioral disturbance: Secondary | ICD-10-CM | POA: Diagnosis not present

## 2013-10-29 DIAGNOSIS — M25569 Pain in unspecified knee: Secondary | ICD-10-CM | POA: Diagnosis not present

## 2013-10-30 DIAGNOSIS — M25569 Pain in unspecified knee: Secondary | ICD-10-CM | POA: Diagnosis not present

## 2013-10-30 DIAGNOSIS — F0280 Dementia in other diseases classified elsewhere without behavioral disturbance: Secondary | ICD-10-CM | POA: Diagnosis not present

## 2013-10-30 DIAGNOSIS — F0390 Unspecified dementia without behavioral disturbance: Secondary | ICD-10-CM | POA: Diagnosis not present

## 2013-10-30 DIAGNOSIS — G309 Alzheimer's disease, unspecified: Secondary | ICD-10-CM | POA: Diagnosis not present

## 2013-10-30 DIAGNOSIS — Z9181 History of falling: Secondary | ICD-10-CM | POA: Diagnosis not present

## 2013-10-30 DIAGNOSIS — K219 Gastro-esophageal reflux disease without esophagitis: Secondary | ICD-10-CM | POA: Diagnosis not present

## 2013-10-30 DIAGNOSIS — I10 Essential (primary) hypertension: Secondary | ICD-10-CM | POA: Diagnosis not present

## 2013-10-31 DIAGNOSIS — Z9181 History of falling: Secondary | ICD-10-CM | POA: Diagnosis not present

## 2013-10-31 DIAGNOSIS — G309 Alzheimer's disease, unspecified: Secondary | ICD-10-CM

## 2013-10-31 DIAGNOSIS — F0280 Dementia in other diseases classified elsewhere without behavioral disturbance: Secondary | ICD-10-CM

## 2013-10-31 DIAGNOSIS — K219 Gastro-esophageal reflux disease without esophagitis: Secondary | ICD-10-CM | POA: Diagnosis not present

## 2013-10-31 DIAGNOSIS — F0390 Unspecified dementia without behavioral disturbance: Secondary | ICD-10-CM | POA: Diagnosis not present

## 2013-10-31 DIAGNOSIS — M25569 Pain in unspecified knee: Secondary | ICD-10-CM | POA: Diagnosis not present

## 2013-10-31 DIAGNOSIS — I10 Essential (primary) hypertension: Secondary | ICD-10-CM | POA: Diagnosis not present

## 2013-11-01 ENCOUNTER — Ambulatory Visit (INDEPENDENT_AMBULATORY_CARE_PROVIDER_SITE_OTHER): Payer: Medicare Other | Admitting: Gastroenterology

## 2013-11-01 ENCOUNTER — Other Ambulatory Visit: Payer: Self-pay

## 2013-11-01 ENCOUNTER — Encounter (INDEPENDENT_AMBULATORY_CARE_PROVIDER_SITE_OTHER): Payer: Self-pay

## 2013-11-01 ENCOUNTER — Encounter: Payer: Self-pay | Admitting: Gastroenterology

## 2013-11-01 VITALS — BP 120/71 | HR 60 | Temp 98.4°F | Ht 63.0 in | Wt 159.0 lb

## 2013-11-01 DIAGNOSIS — D61818 Other pancytopenia: Secondary | ICD-10-CM

## 2013-11-01 DIAGNOSIS — D649 Anemia, unspecified: Secondary | ICD-10-CM

## 2013-11-01 DIAGNOSIS — R195 Other fecal abnormalities: Secondary | ICD-10-CM

## 2013-11-01 NOTE — Assessment & Plan Note (Signed)
80 year old gentleman with history of Hemoccult-positive stool, pancytopenia. Was not able to undergo colonoscopy because he did not refrain from eating solid food the day before his procedure. His constipation has resolved. His appetite has improved. He has gained 3 pounds. At this time he is not interested in pursuing a colonoscopy or upper endoscopy. We discussed that his Hemoccult-positive stool may be completely benign finding from benign anorectal disease but cannot exclude possibility of advanced polyp or malignancy. He voiced understanding that delay in workup may negatively affect his outcome. He is adamant about avoiding endoscopic evaluation. I note that he is most recent hemoglobin in November was very similar to that over the past several years.   Repeat CBC in April. Further recommendations at that point.

## 2013-11-01 NOTE — Progress Notes (Signed)
Primary Care Physician:  [name], MD  Primary Gastroenterologist:  Garfield Cornea, MD   Chief Complaint  Patient presents with  . Colonoscopy    HPI:  [name] is a 80 y.o. male here to reschedule his colonoscopy. Colonoscopy was planned for Hemoccult-positive stool. History of pancytopenia chronically, mild anemia. Previously followed by hematology but no recent followup. No evidence of cirrhosis on imaging in 2012. Chronic calcific pancreatitis noted on prior CT. Procedure was canceled recently because patient ate solid food day before his procedure. Patient previously seen by Dr. [name] in 2007 at time of colonoscopy. He had some granularity and erosions of the rectum with benign biopsies.  Weight is up 3 pounds since his last office visit in November. Patient does not want to have a colonoscopy. His wife supports this decision. He denies rectal bleeding, melena, abdominal pain, vomiting, dysphagia, heartburn. His appetite has improved.   Current Outpatient Prescriptions  Medication Sig Dispense Refill  . amLODipine (NORVASC) 2.5 MG tablet Take 1 tablet (2.5 mg total) by mouth daily.  90 tablet  3  . diazepam (VALIUM) 2 MG tablet Take 2 mg by mouth at bedtime as needed for anxiety.      . diclofenac sodium (VOLTAREN) 1 % GEL Apply 2 g topically 4 (four) times daily.      . dorzolamide-timolol (COSOPT) 22.3-6.8 MG/ML ophthalmic solution       . gabapentin (NEURONTIN) 100 MG capsule Take 1 capsule (100 mg total) by mouth at bedtime.  30 capsule  1  . latanoprost (XALATAN) 0.005 % ophthalmic solution       . Meth-Hyo-M Bl-Na Phos-Ph Sal (URIBEL) 118 MG CAPS Take 1 capsule by mouth every 8 (eight) hours.      . Tamsulosin HCl (FLOMAX) 0.4 MG CAPS Take by mouth.      . traMADol (ULTRAM) 50 MG tablet Take 1 tablet (50 mg total) by mouth daily.  30 tablet  0   No current facility-administered medications for this visit.    Allergies as of 11/01/2013 - Review Complete 11/01/2013   Allergen Reaction Noted  . Codeine Diarrhea 01/31/2008    Past Medical History  Diagnosis Date  . Chronic systolic heart failure   . Rash and other nonspecific skin eruption   . Costochondritis, acute   . Glaucoma   . Erectile dysfunction   . Visual changes   . Pancytopenia   . Chronic pancreatitis     Based on CT findings  . History of melanoma in situ     JAN 2012--  LEFT HEEL W/ SLN BX  . Chronic back pain   . IC (interstitial cystitis)   . BPH (benign prostatic hypertrophy)   . Cardiac pacemaker     CARDIOLOGIST-- DR Cristopher Peru (LAST PACE [facility] 05-15-2013)  . Asymptomatic carotid artery stenosis     BILATERAL MILD ICA---  <50% PER DUPLEX 03-08-2008  . History of syncope   . Hypertension   . CHB (complete heart block)   . Nonischemic cardiomyopathy   . Severe mitral regurgitation   . Urge urinary incontinence     Past Surgical History  Procedure Laterality Date  . Cataract extraction, bilateral  left  1997/   right 2003  . Pacemaker insertion  12-06-2008  DR GREGG TAYLOR    BiV PPM  --  [employer]  . Colonoscopy  08/18/2006    UXL:KGMW granularity and erosions of the rectum of uncertain significance biopsied.  A long redundant, but otherwise normal-appearing colon.melanosi coli  and minimal inflammation on bx  . Inguinal hernia repair Bilateral AS TEEN  . Interstim implant placement  2001  &  2007  . Cysto/ hod/  replacement interstim implant  05-14-2003  . Removal interstim implant/  cyst/  hod  04-14-2004  . Revision interstim implant and replace generator  05-15-2009    left upper buttock for urge urinary incontinence  . Wide excision left heel and sln bx  JUNE 2012  . Tonsillectomy  1950  . Left elbow surgery  2002  . Back surgery  X3  LAST ONE'90's  . Transthoracic echocardiogram  12-06-2008    LVSF 55-60%/  MODERATE MR/  MILD AR/  QUESTION DISTAL POSTERIOR HYPOKINESIS  . Interstim implant revision N/A 08/28/2013    Procedure: REPLACEMENT OF  INTERSTIM-GENERATOR AND LEAD ;  Surgeon: [name], MD;  Location: [facility];  Service: Urology;  Laterality: N/A;    Family History  Problem Relation Age of Onset  . Dementia Mother   . Throat cancer Father   . Cancer Father     throat  . Lung cancer Sister     History   Social History  . Marital Status: Married    Spouse Name: N/A    Number of Children: N/A  . Years of Education: N/A   Occupational History  . retired     Social History Main Topics  . Smoking status: Former Smoker    Quit date: 08/24/1997  . Smokeless tobacco: Not on file  . Alcohol Use: No  . Drug Use: No  . Sexual Activity: Not on file   Other Topics Concern  . Not on file   Social History Narrative  . No narrative on file      ROS:  General: Negative for anorexia, weight loss, fever, chills, fatigue, weakness. Eyes: Negative for vision changes.  ENT: Negative for hoarseness, difficulty swallowing , nasal congestion. CV: Negative for chest pain, angina, palpitations, dyspnea on exertion, peripheral edema.  Respiratory: Negative for dyspnea at rest, dyspnea on exertion, cough, sputum, wheezing.  GI: See history of present illness. GU:  Negative for dysuria, hematuria, urinary incontinence, urinary frequency, nocturnal urination.  MS: Negative for joint pain, low back pain.  Derm: Negative for rash or itching.  Neuro: Negative for weakness, abnormal sensation, seizure, frequent headaches, memory loss, confusion.  Psych: Negative for anxiety, depression, suicidal ideation, hallucinations.  Endo: Negative for unusual weight change.  Heme: Negative for bruising or bleeding. Allergy: Negative for rash or hives.    Physical Examination:  BP 120/71  Pulse 60  Temp(Src) 98.4 F (36.9 C) (Oral)  Ht 5\' 3"  (1.6 m)  Wt 159 lb (72.122 kg)  BMI 28.17 kg/m2   General: Well-nourished, well-developed in no acute distress. Accompanied by spouse. Head: Normocephalic,  atraumatic.   Eyes: Conjunctiva pink, no icterus. Mouth: Oropharyngeal mucosa moist and pink , no lesions erythema or exudate. Neck: Supple without thyromegaly, masses, or lymphadenopathy.  Lungs: Clear to auscultation bilaterally.  Heart: Regular rate and rhythm, no murmurs rubs or gallops.  Abdomen: Bowel sounds are normal, nontender, nondistended  Rectal: not performed Extremities: No lower extremity edema. No clubbing or deformities.  Neuro: Alert and oriented x 4 , grossly normal neurologically.  Skin: Warm and dry, no rash or jaundice.   Psych: Alert and cooperative, normal mood and affect.  Labs: Lab Results  Component Value Date   WBC 2.0* 06/19/2013   HGB 11.2* 08/28/2013   HCT 33.0* 08/28/2013  MCV 91.8 06/19/2013   PLT 97* 06/19/2013     Imaging Studies: No results found.

## 2013-11-01 NOTE — Patient Instructions (Signed)
1. Please have your blood work done in 01/2014.  2. Call if you see blood in your stool or you develop problems with your bowel movement.

## 2013-11-02 DIAGNOSIS — Z9181 History of falling: Secondary | ICD-10-CM | POA: Diagnosis not present

## 2013-11-02 DIAGNOSIS — I10 Essential (primary) hypertension: Secondary | ICD-10-CM | POA: Diagnosis not present

## 2013-11-02 DIAGNOSIS — G309 Alzheimer's disease, unspecified: Secondary | ICD-10-CM | POA: Diagnosis not present

## 2013-11-02 DIAGNOSIS — F0390 Unspecified dementia without behavioral disturbance: Secondary | ICD-10-CM | POA: Diagnosis not present

## 2013-11-02 DIAGNOSIS — M25569 Pain in unspecified knee: Secondary | ICD-10-CM | POA: Diagnosis not present

## 2013-11-02 DIAGNOSIS — K219 Gastro-esophageal reflux disease without esophagitis: Secondary | ICD-10-CM | POA: Diagnosis not present

## 2013-11-02 DIAGNOSIS — F0280 Dementia in other diseases classified elsewhere without behavioral disturbance: Secondary | ICD-10-CM | POA: Diagnosis not present

## 2013-11-06 ENCOUNTER — Encounter (HOSPITAL_COMMUNITY): Payer: Self-pay | Admitting: Psychology

## 2013-11-06 DIAGNOSIS — K219 Gastro-esophageal reflux disease without esophagitis: Secondary | ICD-10-CM | POA: Diagnosis not present

## 2013-11-06 DIAGNOSIS — I10 Essential (primary) hypertension: Secondary | ICD-10-CM | POA: Diagnosis not present

## 2013-11-06 DIAGNOSIS — F0280 Dementia in other diseases classified elsewhere without behavioral disturbance: Secondary | ICD-10-CM | POA: Diagnosis not present

## 2013-11-06 DIAGNOSIS — Z9181 History of falling: Secondary | ICD-10-CM | POA: Diagnosis not present

## 2013-11-06 DIAGNOSIS — F0390 Unspecified dementia without behavioral disturbance: Secondary | ICD-10-CM | POA: Diagnosis not present

## 2013-11-06 DIAGNOSIS — M25569 Pain in unspecified knee: Secondary | ICD-10-CM | POA: Diagnosis not present
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
## 2013-11-06 NOTE — Progress Notes (Signed)
cc'd to pcp 

## 2013-11-07 DIAGNOSIS — F0280 Dementia in other diseases classified elsewhere without behavioral disturbance: Secondary | ICD-10-CM | POA: Diagnosis not present

## 2013-11-07 DIAGNOSIS — F0390 Unspecified dementia without behavioral disturbance: Secondary | ICD-10-CM | POA: Diagnosis not present

## 2013-11-07 DIAGNOSIS — M25569 Pain in unspecified knee: Secondary | ICD-10-CM | POA: Diagnosis not present

## 2013-11-07 DIAGNOSIS — I10 Essential (primary) hypertension: Secondary | ICD-10-CM | POA: Diagnosis not present

## 2013-11-07 DIAGNOSIS — Z9181 History of falling: Secondary | ICD-10-CM | POA: Diagnosis not present

## 2013-11-07 DIAGNOSIS — K219 Gastro-esophageal reflux disease without esophagitis: Secondary | ICD-10-CM | POA: Diagnosis not present

## 2013-11-09 ENCOUNTER — Ambulatory Visit (INDEPENDENT_AMBULATORY_CARE_PROVIDER_SITE_OTHER): Payer: Medicare Other | Admitting: Family Medicine

## 2013-11-09 ENCOUNTER — Encounter: Payer: Self-pay | Admitting: Family Medicine

## 2013-11-09 VITALS — BP 130/82 | HR 70 | Resp 18 | Ht 68.0 in | Wt 156.0 lb

## 2013-11-09 DIAGNOSIS — M549 Dorsalgia, unspecified: Secondary | ICD-10-CM

## 2013-11-09 DIAGNOSIS — I10 Essential (primary) hypertension: Secondary | ICD-10-CM | POA: Diagnosis not present

## 2013-11-09 DIAGNOSIS — R29898 Other symptoms and signs involving the musculoskeletal system: Secondary | ICD-10-CM

## 2013-11-09 DIAGNOSIS — I5022 Chronic systolic (congestive) heart failure: Secondary | ICD-10-CM | POA: Diagnosis not present

## 2013-11-09 DIAGNOSIS — N3010 Interstitial cystitis (chronic) without hematuria: Secondary | ICD-10-CM | POA: Diagnosis not present

## 2013-11-09 DIAGNOSIS — K219 Gastro-esophageal reflux disease without esophagitis: Secondary | ICD-10-CM | POA: Diagnosis not present

## 2013-11-09 DIAGNOSIS — R351 Nocturia: Secondary | ICD-10-CM

## 2013-11-09 DIAGNOSIS — M25569 Pain in unspecified knee: Secondary | ICD-10-CM | POA: Diagnosis not present

## 2013-11-09 DIAGNOSIS — G309 Alzheimer's disease, unspecified: Secondary | ICD-10-CM | POA: Diagnosis not present

## 2013-11-09 DIAGNOSIS — F0390 Unspecified dementia without behavioral disturbance: Secondary | ICD-10-CM | POA: Diagnosis not present

## 2013-11-09 DIAGNOSIS — F0280 Dementia in other diseases classified elsewhere without behavioral disturbance: Secondary | ICD-10-CM | POA: Diagnosis not present

## 2013-11-09 DIAGNOSIS — Z79899 Other long term (current) drug therapy: Secondary | ICD-10-CM

## 2013-11-09 DIAGNOSIS — Z950 Presence of cardiac pacemaker: Secondary | ICD-10-CM

## 2013-11-09 DIAGNOSIS — Z9181 History of falling: Secondary | ICD-10-CM | POA: Diagnosis not present

## 2013-11-09 LAB — POCT URINALYSIS DIPSTICK
BILIRUBIN UA: NEGATIVE
GLUCOSE UA: NEGATIVE
Ketones, UA: NEGATIVE
Leukocytes, UA: NEGATIVE
Nitrite, UA: NEGATIVE
Protein, UA: 30
Spec Grav, UA: 1.02
Urobilinogen, UA: 2
pH, UA: 7.5

## 2013-11-09 NOTE — Patient Instructions (Addendum)
F/u in  6 weeks , call if you need me before  Blood pressure is excellent  New is aspirin 81 mg one daily  Use valium 5mg  half at bedtime to see if this helps with excessive urination at night until you see Dr Roni Bread  For pain, the only medication you have is tramadol 50 mg one as needed. You report not using this or any pain medication often anymore  Get appt for Dr Roni Bread at checkout please

## 2013-11-10 DIAGNOSIS — Z79899 Other long term (current) drug therapy: Secondary | ICD-10-CM | POA: Insufficient documentation

## 2013-11-10 NOTE — Assessment & Plan Note (Signed)
Ongoing problem, no improvement per pt

## 2013-11-10 NOTE — Assessment & Plan Note (Signed)
Improved with recent therapy. Will use tramadol 50 mg one daily, only if needed

## 2013-11-10 NOTE — Assessment & Plan Note (Signed)
No decompensation currently, class 2

## 2013-11-10 NOTE — Assessment & Plan Note (Signed)
C/o nocturia which disturbs his sleep, no improvement with recent procedure reportedly. I have advised him to try half valium at bedtime, urology had prescribed 3 times daily, he has not been taking this, and I suspect he would be unable to tolerate that high a dose. UA in the office is negative for infection. F/U with urology is to be arranged

## 2013-11-10 NOTE — Assessment & Plan Note (Signed)
Improved following recent physical therapy sessions, also ahs not been using pain medication regularly

## 2013-11-10 NOTE — Progress Notes (Signed)
   Subjective:    Patient ID: [name], male    DOB: Mar 16, 1926, 80 y.o.   MRN: [id number]  HPI Pt in primarily at the request of home health for blood pressure evaluation, a very high pressure was reportedly obtained. He is also here for med rec, which is wonderful. He continues to c/o excessive urination esp at night , which is extremely bothersome to him. He had a procedure in Dec for this and needs f/u. He has a script for valium regularly from urology which he has no been taking, I recommend taking half tab at night to see if this helps, if he is able to tolerate it, until he sees urology Denies significant back pain today , and states he ihas benefited as far as his walking is concerned, from recent PT   Review of Systems See HPI Denies recent fever or chills. Denies sinus pressure, nasal congestion, ear pain or sore throat. Denies chest congestion, productive cough or wheezing. Denies chest pains, palpitations and leg swelling Denies abdominal pain, nausea, vomiting,diarrhea or constipation.    Denies headaches,   Denies skin break down or rash.        Objective:   Physical Exam BP 130/82  Pulse 70  Resp 18  Ht 5\' 8"  (1.727 m)  Wt 156 lb 0.6 oz (70.779 kg)  BMI 23.73 kg/m2  SpO2 98% Patient alert and oriented and in no cardiopulmonary distress.  HEENT: No facial asymmetry, EOMI, no sinus tenderness,  oropharynx pink and moist.  Neck decreased ROM no adenopathy.No JVD  Chest: Clear to auscultation bilaterally.  CVS: S1, S2 systolic  murmur, no S3.  ABD: Soft non tender. Bowel sounds normal.  Ext: No edema  MS: Decrease ROM spine, shoulders, hips and knees.  Skin: Intact, no ulcerations or rash noted.  Psych: Good eye contact, normal affect. Memory impaired  not anxious or depressed appearing.  CNS: CN 2-12 intact,hearing loss, moderate, power,  normal throughout.        Assessment & Plan:

## 2013-11-10 NOTE — Assessment & Plan Note (Signed)
Controlled, no change in medication  

## 2013-11-10 NOTE — Assessment & Plan Note (Signed)
Pt has all meds today, he and his wife have allowed me to get rid of all the tablets/ meds he is not to take. The current list reflects he recmmended meds as of this visit. Only variation may be in his eye drops, which he has not brought physically today

## 2013-11-10 NOTE — Progress Notes (Signed)
   Subjective:    Patient ID: [name], male    DOB: 26-Mar-1926, 80 y.o.   MRN: [id number]  HPI    Review of Systems     Objective:   Physical Exam        Assessment & Plan:  HYPERTENSION Controlled, no change in medication   CHRONIC SYSTOLIC HEART FAILURE No decompensation currently, class 2  Lower extremity weakness Improved following recent physical therapy sessions, also ahs not been using pain medication regularly  INTERSTITIAL CYSTITIS C/o nocturia which disturbs his sleep, no improvement with recent procedure reportedly. I have advised him to try half valium at bedtime, urology had prescribed 3 times daily, he has not been taking this, and I suspect he would be unable to tolerate that high a dose. UA in the office is negative for infection. F/U with urology is to be arranged  BACK PAIN Improved with recent therapy. Will use tramadol 50 mg one daily, only if needed  CARDIAC PACEMAKER IN SITU Rate in office is regular will continue to be followed regularly by cardiology  Nocturia Ongoing problem, no improvement per pt  Encounter for medication management Pt has all meds today, he and his wife have allowed me to get rid of all the tablets/ meds he is not to take. The current list reflects he recmmended meds as of this visit. Only variation may be in his eye drops, which he has not brought physically today

## 2013-11-10 NOTE — Assessment & Plan Note (Signed)
Rate in office is regular will continue to be followed regularly by cardiology

## 2013-11-13 ENCOUNTER — Other Ambulatory Visit: Payer: Self-pay | Admitting: Gastroenterology

## 2013-11-13 DIAGNOSIS — F0390 Unspecified dementia without behavioral disturbance: Secondary | ICD-10-CM | POA: Diagnosis not present

## 2013-11-13 DIAGNOSIS — F0280 Dementia in other diseases classified elsewhere without behavioral disturbance: Secondary | ICD-10-CM | POA: Diagnosis not present

## 2013-11-13 DIAGNOSIS — M25569 Pain in unspecified knee: Secondary | ICD-10-CM | POA: Diagnosis not present

## 2013-11-13 DIAGNOSIS — G309 Alzheimer's disease, unspecified: Secondary | ICD-10-CM | POA: Diagnosis not present

## 2013-11-13 DIAGNOSIS — Z9181 History of falling: Secondary | ICD-10-CM | POA: Diagnosis not present

## 2013-11-13 DIAGNOSIS — I10 Essential (primary) hypertension: Secondary | ICD-10-CM | POA: Diagnosis not present

## 2013-11-13 DIAGNOSIS — K219 Gastro-esophageal reflux disease without esophagitis: Secondary | ICD-10-CM | POA: Diagnosis not present

## 2013-11-14 ENCOUNTER — Telehealth: Payer: Self-pay

## 2013-11-14 ENCOUNTER — Ambulatory Visit (INDEPENDENT_AMBULATORY_CARE_PROVIDER_SITE_OTHER): Payer: Medicare Other | Admitting: Psychology

## 2013-11-14 DIAGNOSIS — F0391 Unspecified dementia with behavioral disturbance: Secondary | ICD-10-CM | POA: Diagnosis not present

## 2013-11-14 DIAGNOSIS — F03918 Unspecified dementia, unspecified severity, with other behavioral disturbance: Secondary | ICD-10-CM | POA: Diagnosis not present

## 2013-11-14 MED ORDER — LINACLOTIDE 145 MCG PO CAPS: ORAL_CAPSULE | ORAL | Status: DC

## 2013-11-14 NOTE — Telephone Encounter (Signed)
Pt came by office. He rx for linzess 184mcg sent to his mail order- [facility]

## 2013-11-14 NOTE — Telephone Encounter (Signed)
done

## 2013-11-15 DIAGNOSIS — F0280 Dementia in other diseases classified elsewhere without behavioral disturbance: Secondary | ICD-10-CM | POA: Diagnosis not present

## 2013-11-15 DIAGNOSIS — F0390 Unspecified dementia without behavioral disturbance: Secondary | ICD-10-CM | POA: Diagnosis not present

## 2013-11-15 DIAGNOSIS — I10 Essential (primary) hypertension: Secondary | ICD-10-CM | POA: Diagnosis not present

## 2013-11-15 DIAGNOSIS — M25569 Pain in unspecified knee: Secondary | ICD-10-CM | POA: Diagnosis not present

## 2013-11-15 DIAGNOSIS — G309 Alzheimer's disease, unspecified: Secondary | ICD-10-CM | POA: Diagnosis not present

## 2013-11-15 DIAGNOSIS — K219 Gastro-esophageal reflux disease without esophagitis: Secondary | ICD-10-CM | POA: Diagnosis not present

## 2013-11-15 DIAGNOSIS — Z9181 History of falling: Secondary | ICD-10-CM | POA: Diagnosis not present

## 2013-11-19 ENCOUNTER — Other Ambulatory Visit: Payer: Self-pay

## 2013-11-19 MED ORDER — GABAPENTIN 100 MG PO CAPS: 100.0000 mg | ORAL_CAPSULE | Freq: Every day | ORAL | Status: DC

## 2013-11-21 ENCOUNTER — Encounter: Payer: Self-pay | Admitting: *Deleted

## 2013-11-21 DIAGNOSIS — I10 Essential (primary) hypertension: Secondary | ICD-10-CM | POA: Diagnosis not present

## 2013-11-21 DIAGNOSIS — Z9181 History of falling: Secondary | ICD-10-CM | POA: Diagnosis not present

## 2013-11-21 DIAGNOSIS — M25569 Pain in unspecified knee: Secondary | ICD-10-CM | POA: Diagnosis not present

## 2013-11-21 DIAGNOSIS — F0390 Unspecified dementia without behavioral disturbance: Secondary | ICD-10-CM | POA: Diagnosis not present

## 2013-11-21 DIAGNOSIS — F0280 Dementia in other diseases classified elsewhere without behavioral disturbance: Secondary | ICD-10-CM | POA: Diagnosis not present

## 2013-11-21 DIAGNOSIS — K219 Gastro-esophageal reflux disease without esophagitis: Secondary | ICD-10-CM | POA: Diagnosis not present

## 2013-11-21 DIAGNOSIS — G309 Alzheimer's disease, unspecified: Secondary | ICD-10-CM | POA: Diagnosis not present

## 2013-11-22 ENCOUNTER — Telehealth: Payer: Self-pay | Admitting: Internal Medicine

## 2013-11-22 NOTE — Telephone Encounter (Signed)
Pt came in a few weeks ago regarding his Linzess prescription and trying to get it through [facility] mail order. He is confused, I am confused as well. The notes say this has been done, but he is telling me today that there is a hold up on his medication and we need to call [phone] so they will send him his prescription via mail order. Please call him back if you have any questions at [phone]

## 2013-11-23 ENCOUNTER — Ambulatory Visit (HOSPITAL_COMMUNITY): Payer: Self-pay | Admitting: Psychology

## 2013-11-23 ENCOUNTER — Other Ambulatory Visit: Payer: Self-pay | Admitting: Gastroenterology

## 2013-11-23 DIAGNOSIS — I10 Essential (primary) hypertension: Secondary | ICD-10-CM | POA: Diagnosis not present

## 2013-11-23 DIAGNOSIS — F0390 Unspecified dementia without behavioral disturbance: Secondary | ICD-10-CM | POA: Diagnosis not present

## 2013-11-23 DIAGNOSIS — K219 Gastro-esophageal reflux disease without esophagitis: Secondary | ICD-10-CM | POA: Diagnosis not present

## 2013-11-23 DIAGNOSIS — M25569 Pain in unspecified knee: Secondary | ICD-10-CM | POA: Diagnosis not present

## 2013-11-23 DIAGNOSIS — F0280 Dementia in other diseases classified elsewhere without behavioral disturbance: Secondary | ICD-10-CM | POA: Diagnosis not present

## 2013-11-23 DIAGNOSIS — Z9181 History of falling: Secondary | ICD-10-CM | POA: Diagnosis not present

## 2013-11-23 DIAGNOSIS — G309 Alzheimer's disease, unspecified: Secondary | ICD-10-CM | POA: Diagnosis not present

## 2013-11-23 MED ORDER — LINACLOTIDE 145 MCG PO CAPS
ORAL_CAPSULE | ORAL | Status: DC
Start: 2013-11-23 — End: 2014-05-20

## 2013-11-23 NOTE — Progress Notes (Unsigned)
Sent RX to mail order per patient request.

## 2013-11-28 ENCOUNTER — Ambulatory Visit: Payer: Medicare Other | Admitting: Family Medicine

## 2013-11-30 ENCOUNTER — Ambulatory Visit: Payer: Self-pay | Admitting: Urology

## 2013-12-04 NOTE — Telephone Encounter (Signed)
Tried to call phone number given by pt x2 and it is a fax number. rx was sent to [facility]. I have not received anything stating pt needs a PA for this. Tried to call pt NA

## 2013-12-05 DIAGNOSIS — K219 Gastro-esophageal reflux disease without esophagitis: Secondary | ICD-10-CM | POA: Diagnosis not present

## 2013-12-05 DIAGNOSIS — F0390 Unspecified dementia without behavioral disturbance: Secondary | ICD-10-CM | POA: Diagnosis not present

## 2013-12-05 DIAGNOSIS — I10 Essential (primary) hypertension: Secondary | ICD-10-CM | POA: Diagnosis not present

## 2013-12-05 DIAGNOSIS — F0280 Dementia in other diseases classified elsewhere without behavioral disturbance: Secondary | ICD-10-CM | POA: Diagnosis not present

## 2013-12-05 DIAGNOSIS — Z9181 History of falling: Secondary | ICD-10-CM | POA: Diagnosis not present

## 2013-12-05 DIAGNOSIS — M25569 Pain in unspecified knee: Secondary | ICD-10-CM | POA: Diagnosis not present

## 2013-12-06 DIAGNOSIS — H4011X0 Primary open-angle glaucoma, stage unspecified: Secondary | ICD-10-CM | POA: Diagnosis not present

## 2013-12-06 DIAGNOSIS — Z961 Presence of intraocular lens: Secondary | ICD-10-CM | POA: Diagnosis not present

## 2013-12-07 DIAGNOSIS — F0390 Unspecified dementia without behavioral disturbance: Secondary | ICD-10-CM | POA: Diagnosis not present

## 2013-12-07 DIAGNOSIS — I10 Essential (primary) hypertension: Secondary | ICD-10-CM | POA: Diagnosis not present

## 2013-12-07 DIAGNOSIS — K219 Gastro-esophageal reflux disease without esophagitis: Secondary | ICD-10-CM | POA: Diagnosis not present

## 2013-12-07 DIAGNOSIS — M25569 Pain in unspecified knee: Secondary | ICD-10-CM | POA: Diagnosis not present

## 2013-12-07 DIAGNOSIS — Z9181 History of falling: Secondary | ICD-10-CM | POA: Diagnosis not present

## 2013-12-07 DIAGNOSIS — G309 Alzheimer's disease, unspecified: Secondary | ICD-10-CM | POA: Diagnosis not present

## 2013-12-07 DIAGNOSIS — F0280 Dementia in other diseases classified elsewhere without behavioral disturbance: Secondary | ICD-10-CM | POA: Diagnosis not present

## 2013-12-14 ENCOUNTER — Ambulatory Visit (INDEPENDENT_AMBULATORY_CARE_PROVIDER_SITE_OTHER): Payer: Medicare Other | Admitting: Urology

## 2013-12-14 DIAGNOSIS — N3010 Interstitial cystitis (chronic) without hematuria: Secondary | ICD-10-CM

## 2013-12-14 DIAGNOSIS — R339 Retention of urine, unspecified: Secondary | ICD-10-CM

## 2013-12-14 DIAGNOSIS — R300 Dysuria: Secondary | ICD-10-CM | POA: Diagnosis not present

## 2013-12-19 ENCOUNTER — Ambulatory Visit (HOSPITAL_COMMUNITY): Payer: Self-pay | Admitting: Psychology

## 2013-12-19 ENCOUNTER — Ambulatory Visit (INDEPENDENT_AMBULATORY_CARE_PROVIDER_SITE_OTHER): Payer: Medicare Other | Admitting: Psychology

## 2013-12-19 DIAGNOSIS — F03918 Unspecified dementia, unspecified severity, with other behavioral disturbance: Secondary | ICD-10-CM

## 2013-12-19 DIAGNOSIS — F0391 Unspecified dementia with behavioral disturbance: Secondary | ICD-10-CM | POA: Diagnosis not present

## 2013-12-20 ENCOUNTER — Ambulatory Visit: Payer: Medicare Other | Admitting: Family Medicine

## 2013-12-20 NOTE — Progress Notes (Signed)
   Psychological Testing  Session Time: 2 Hours for testing and scoring  Participation Level: Active  Behavioral Response: Well GroomedAlertNA   Testing using the CERAD battery.  Results and feedback will be presented on follow-up apointment note.    Edgardo Roys, PsyD 12/20/2013

## 2013-12-22 NOTE — Progress Notes (Signed)
   Subjective:    Patient ID: [name], male    DOB: 06/11/1926, 80 y.o.   MRN: [id number]  HPI    Review of Systems     Objective:   Physical Exam        Assessment & Plan:  None needed, med rec visit only

## 2013-12-25 ENCOUNTER — Other Ambulatory Visit: Payer: Self-pay

## 2013-12-25 DIAGNOSIS — D649 Anemia, unspecified: Secondary | ICD-10-CM

## 2013-12-28 ENCOUNTER — Encounter (INDEPENDENT_AMBULATORY_CARE_PROVIDER_SITE_OTHER): Payer: Self-pay

## 2013-12-28 ENCOUNTER — Encounter: Payer: Self-pay | Admitting: Family Medicine

## 2013-12-28 ENCOUNTER — Ambulatory Visit (INDEPENDENT_AMBULATORY_CARE_PROVIDER_SITE_OTHER): Payer: Medicare Other | Admitting: Family Medicine

## 2013-12-28 VITALS — BP 120/80 | HR 63 | Resp 16 | Wt 159.8 lb

## 2013-12-28 DIAGNOSIS — R351 Nocturia: Secondary | ICD-10-CM | POA: Diagnosis not present

## 2013-12-28 DIAGNOSIS — I10 Essential (primary) hypertension: Secondary | ICD-10-CM

## 2013-12-28 DIAGNOSIS — N3010 Interstitial cystitis (chronic) without hematuria: Secondary | ICD-10-CM

## 2013-12-28 DIAGNOSIS — H905 Unspecified sensorineural hearing loss: Secondary | ICD-10-CM

## 2013-12-28 DIAGNOSIS — I5022 Chronic systolic (congestive) heart failure: Secondary | ICD-10-CM

## 2013-12-28 DIAGNOSIS — F0280 Dementia in other diseases classified elsewhere without behavioral disturbance: Secondary | ICD-10-CM

## 2013-12-28 DIAGNOSIS — N3000 Acute cystitis without hematuria: Secondary | ICD-10-CM

## 2013-12-28 DIAGNOSIS — G309 Alzheimer's disease, unspecified: Secondary | ICD-10-CM

## 2013-12-28 DIAGNOSIS — R29898 Other symptoms and signs involving the musculoskeletal system: Secondary | ICD-10-CM

## 2013-12-28 LAB — POCT URINALYSIS DIPSTICK
BILIRUBIN UA: NEGATIVE
Blood, UA: NEGATIVE
Glucose, UA: NEGATIVE
KETONES UA: NEGATIVE
Leukocytes, UA: NEGATIVE
Nitrite, UA: NEGATIVE
PH UA: 7.5
PROTEIN UA: NEGATIVE
Spec Grav, UA: 1.02
Urobilinogen, UA: 1

## 2013-12-28 MED ORDER — AMITRIPTYLINE HCL 10 MG PO TABS: 10.0000 mg | ORAL_TABLET | Freq: Every day | ORAL | Status: DC

## 2013-12-28 NOTE — Progress Notes (Signed)
   Subjective:    Patient ID: [name], male    DOB: 1925/12/29, 80 y.o.   MRN: [id number]  HPI Pt called in to be seen today due to increased urination at night, contributing to increased anxiety and poor sleep. He denies fever, chills or flank pain, states urine issue is much worse at night and he is frustrated by this. Denies falls but c/o lower extremity weakness Again discusses the fact that he feels he is able to drive, and continues to struggle with the fact that he has been advised against driving , he no longer drives. C/o increased anxiety,and feels that a nerve pill would help him, I do agree.,    Review of Systems See HPI Denies recent fever or chills. Denies sinus pressure, nasal congestion, ear pain or sore throat. Denies chest congestion, productive cough or wheezing. Denies chest pains, palpitations and leg swelling Denies abdominal pain, nausea, vomiting,diarrhea or constipation.    Denies headaches, seizures, numbness, or tingling.  Denies skin break down or rash.        Objective:   Physical Exam  BP 120/80  Pulse 63  Resp 16  Wt 159 lb 12.8 oz (72.485 kg)  SpO2 96% Patient alert and in no cardiopulmonary distress.  HEENT: No facial asymmetry, EOMI, no sinus tenderness,  oropharynx pink and moist.  Neck decreased though adequate ROM, noJjVD, no adenopathy.  Chest: Clear to auscultation bilaterally.  CVS: S1, S2 systolic  murmurs, no S3.  ABD: Soft non tender. Bowel sounds normal.  Ext: No edema  MS: Adequate though reduced  ROM spine, shoulders, hips and knees.  Skin: Intact, no ulcerations or rash noted.  Psych: Good eye contact, normal affect. Memory impaired  Anxious not  depressed appearing.  CNS: CN 2-12 intact, power, normal throughout.       Assessment & Plan:  Nocturia Anxiety, poor sleep and frequent urination at night, no infection in urine. Trial of amitriptyline low dose  HYPERTENSION Controlled, no change in  medication   Bilateral leg weakness Continued c/o lower extremity weakness, no falls, no interest in therapy currently  Acute cystitis C/o increased frequency in the past several days esp at night, UA in office is negative, pt reassured  CENTRAL HEARING LOSS needs hearing aids, will advise checking belltone  For help with this  Alzheimer's dementia Pt unsafe to drive, despite being advised of this on several occasions still resists this, however not currenlty driving , though he advises , "I still have a license" Will reassess MMSE next visit, has been on medication  In the past and should benefit form  this  CHRONIC SYSTOLIC HEART FAILURE no symptoms of decompensation at this time, clinically stable

## 2013-12-28 NOTE — Patient Instructions (Addendum)
F/u in 3 month, call if you need me before  New to help with increased urination at night is sent to your local drug store  Please drink at least  5  Glasses of water

## 2013-12-29 ENCOUNTER — Telehealth: Payer: Self-pay | Admitting: Family Medicine

## 2013-12-29 DIAGNOSIS — N3000 Acute cystitis without hematuria: Secondary | ICD-10-CM | POA: Insufficient documentation

## 2013-12-29 NOTE — Assessment & Plan Note (Signed)
Controlled, no change in medication  

## 2013-12-29 NOTE — Telephone Encounter (Signed)
Pls contact pt let him know I hope that he has had better rest with his new med , and that he should continue to take it every night, unless he reports adverse s/e. ALSO pls give himself and his wife tele # for bell tone, both would benefit from hearing aids , if they have none, (I dont think either has)make them aware of the program (may get frree) the tele # is [phone], they call and make apppt to  Be evaluated there (hopefully they are interested and will follow through)

## 2013-12-29 NOTE — Assessment & Plan Note (Signed)
no symptoms of decompensation at this time, clinically stable

## 2013-12-29 NOTE — Assessment & Plan Note (Signed)
Anxiety, poor sleep and frequent urination at night, no infection in urine. Trial of amitriptyline low dose

## 2013-12-29 NOTE — Assessment & Plan Note (Signed)
C/o increased frequency in the past several days esp at night, UA in office is negative, pt reassured

## 2013-12-29 NOTE — Assessment & Plan Note (Addendum)
Pt unsafe to drive, despite being advised of this on several occasions still resists this, however not currenlty driving , though he advises , "I still have a license" Will reassess MMSE next visit, has been on medication  In the past and should benefit form  this

## 2013-12-29 NOTE — Assessment & Plan Note (Signed)
Continued c/o lower extremity weakness, no falls, no interest in therapy currently

## 2013-12-29 NOTE — Assessment & Plan Note (Signed)
needs hearing aids, will advise checking belltone  For help with this

## 2013-12-31 ENCOUNTER — Ambulatory Visit: Payer: Medicare Other | Admitting: Family Medicine

## 2013-12-31 NOTE — Telephone Encounter (Signed)
Patient states the med has helped and will continue it.

## 2014-01-02 ENCOUNTER — Encounter (HOSPITAL_COMMUNITY): Payer: Self-pay | Admitting: Psychology

## 2014-01-02 NOTE — Progress Notes (Signed)
GENERAL INTELLECTUAL FUNCTIONING:  The patient was initially administered the CERAD Neuropsychological Battery.  Below are the patient's scores in relationship to normative data and mild/moderate Senile Dementia of the Alzheimer's Type.      VF Name MMSE [facility] WLR [facility] Patient:  14 15 28 7 15 5 8   Normal Age Matched 20 14.6 28.9 10.1 21.1 7.2 9.6  Mild SDAT:  8.8 11.8 20 7.8 8.8 0.9 4.8   Moderate SDAT 6.8 10.5 16.1 6.5 6.3 0.4 3.9   On the CERAD battery, which is a battery of measures shown to be sensitive to various forms of dementia, the patient's performance was overall good compared to his age-matched peers groomed been generally within the normal range level of functioning. The pattern of cognitive strengths and weaknesses was not consistent with the group typically associated with Alzheimer's dementia.  Behavioral observations indicate that he was well oriented and able to engage in normal conversations without difficulty. However, he did show some difficulties with vision even coming into the testing room which are likely related to cataracts for some visual spatial deficits. His reaction time appeared to be slow but his accuracy was quite good.  His performance on the Mini-Mental State Exam indicates that he was oriented to person place and time no difficulty with that. He was able to initially name 3 objects efficiently in [place] 2 of those 3 objects after a period of delay. He had some difficulty but not significant spelled world backwards area patient showed the ability to follow directions in order and showed no indications of aphasic or apraxic difficulties. There were no indications of circumlocutions or paraphrasing errors. His performance on the constructional praxis test, a test sensitive to cortically based neurological deficits was he impaired but was impaired. He had difficulty integrating figures and planning. However, this may be affected by problems with his vision but I  do think that some of these visual-spatial issues go beyond those that can be accounted for just by his basic vision difficulties.  There were also some issues related to fine motor control as well.   The patient's performance on the Verbal Fluency Test was within normal limits and not indicative of significant frontal lobe issues related to word finding problems and retrieval issues. His ability to name pictures of common and uncommon objects as measured by the modified [facility] Test was also clearly within normal limits and did not suggest any issues with target naming either.  The patient's verbal learning and ability to encode new information was slightly below but generally in a pattern consistent with a [place] normal age matched comparison group. These patterns were markedly better than even a mild Alzheimer's dementia comparison group. There were no indications of an inability to learn new information, store of information over time, and retrieve that information. Delayed. He was able to recall more than 50% of the words presented over 3 trials and was was able to recall 50% of those words after a period of delay. While this was slightly below normative expectations and was clearly markedly above patterns even in a very early stage Alzheimer's population group  Overall, this performance indicates that the patient is having some focal difficulties with visual spatial abilities but all other areas that were assessed were generally within normal limits and consistent with an age matched normative comparison group. This pattern was clearly different and not consistent with various forms of dementia such as Alzheimer's, Binswanger's, multi-infarct dementia, or other more rare/significant cortical  or subcortical dementias.    [place]y and Conclusions:  The results of the current neuropsychological assessment suggest that overall [name] is doing remarkably well. There were no indications of  significant dementia loss and cognitive functioning with the exception of focal deficits have to do with visual-spatial difficulties. When the primary reasons for the referral for testing had to do with concerns about him driving. While the patient does have issues related to cataracts the difficulties he had visual-spatial issues likely would beyond simple functional cataracts. In fact, he did quite well on the [place] which also requires visual acuity could not visual-spatial abilities. The patient showed some fine motor control issues as well as issues integrated and visual spatial concepts and features. The results of the clinical interview, long discussions with both the patient and his wife as well as objective features of neuropsychological testing clearly suggest that the patient should not be driving now and should avoid situations where he is responsible for driving or other issues that require fast information processing speed along with visual-spatial accuracy. There were no indications of mood disturbance related to depression or anxiety.  The patient did voice a great deal of concern about not being able to drive and how this will significantly hamper his ability for social interaction. His wife has reaffirmed him that she would be happy to take him anywhere he needs to go service inability to drive does not have to have a significant impact on his day-to-day activities in social interactions. However, in any event the patient does not appear to be functioning at a level to allow him to be driving actively.

## 2014-01-11 ENCOUNTER — Ambulatory Visit (INDEPENDENT_AMBULATORY_CARE_PROVIDER_SITE_OTHER): Payer: Medicare Other | Admitting: *Deleted

## 2014-01-11 ENCOUNTER — Encounter: Payer: Self-pay | Admitting: Internal Medicine

## 2014-01-11 DIAGNOSIS — R55 Syncope and collapse: Secondary | ICD-10-CM | POA: Diagnosis not present

## 2014-01-11 DIAGNOSIS — I5022 Chronic systolic (congestive) heart failure: Secondary | ICD-10-CM | POA: Diagnosis not present

## 2014-01-11 LAB — MDC_IDC_ENUM_SESS_TYPE_INCLINIC
Battery Remaining Longevity: 33 mo
Brady Statistic AP VS Percent: 0 %
Brady Statistic AS VS Percent: 0 %
Date Time Interrogation Session: 20150410151405
Lead Channel Impedance Value: 394 Ohm
Lead Channel Impedance Value: 458 Ohm
Lead Channel Impedance Value: 799 Ohm
Lead Channel Pacing Threshold Amplitude: 1 V
Lead Channel Pacing Threshold Amplitude: 1.5 V
Lead Channel Pacing Threshold Pulse Width: 0.4 ms
Lead Channel Sensing Intrinsic Amplitude: 2.8 mV
Lead Channel Setting Pacing Amplitude: 2 V
Lead Channel Setting Pacing Amplitude: 3 V
Lead Channel Setting Pacing Pulse Width: 0.4 ms
Lead Channel Setting Sensing Sensitivity: 2.8 mV
MDC IDC MSMT BATTERY VOLTAGE: 2.95 V
MDC IDC MSMT LEADCHNL LV PACING THRESHOLD AMPLITUDE: 1 V
MDC IDC MSMT LEADCHNL LV PACING THRESHOLD PULSEWIDTH: 0.4 ms
MDC IDC MSMT LEADCHNL RV PACING THRESHOLD PULSEWIDTH: 0.4 ms
MDC IDC SET LEADCHNL RA PACING AMPLITUDE: 2 V
MDC IDC SET LEADCHNL RV PACING PULSEWIDTH: 0.4 ms
MDC IDC STAT BRADY AP VP PERCENT: 61 %
MDC IDC STAT BRADY AS VP PERCENT: 39 %

## 2014-01-11 NOTE — Progress Notes (Signed)
PPM check in office. 

## 2014-01-14 DIAGNOSIS — M545 Low back pain, unspecified: Secondary | ICD-10-CM | POA: Diagnosis not present

## 2014-01-14 DIAGNOSIS — M25569 Pain in unspecified knee: Secondary | ICD-10-CM | POA: Diagnosis not present

## 2014-01-19 ENCOUNTER — Other Ambulatory Visit: Payer: Self-pay | Admitting: Family Medicine

## 2014-02-05 DIAGNOSIS — N3010 Interstitial cystitis (chronic) without hematuria: Secondary | ICD-10-CM | POA: Diagnosis not present

## 2014-02-05 DIAGNOSIS — R300 Dysuria: Secondary | ICD-10-CM | POA: Diagnosis not present

## 2014-02-05 DIAGNOSIS — R351 Nocturia: Secondary | ICD-10-CM | POA: Diagnosis not present

## 2014-02-15 ENCOUNTER — Telehealth: Payer: Self-pay | Admitting: *Deleted

## 2014-02-15 NOTE — Telephone Encounter (Signed)
I spoke with pts wife as he was outside cutting grass! He took rolaids earlier and feels fine now.has fu with Dr.taylor on 5/25

## 2014-02-15 NOTE — Telephone Encounter (Signed)
Pt states that he has been tired and had some sharpe pains in chest. He just know he doesn't feel good and he has never felt this way so he doesn't know how to explain it.  I have set him up to see Dr Lovena Le 02/22/14. (patient is aware)

## 2014-02-18 ENCOUNTER — Telehealth: Payer: Self-pay

## 2014-02-18 NOTE — Telephone Encounter (Addendum)
I recommend psych eval for medication management, pls explain to her and if she an pt agree enter referral for psychiatry , important he gets this done sooner rather than later in case he deteriorates rapidly, so encourage them to agree to eval. If no "soon appt" and he continues to deteriorate, will need ED eval/. Pls enquire re symptoms of infection, fever, chills , poor apetite , malodorous urine, will need Ed eval if this is the case also Questions or concerns pls let me know

## 2014-02-20 NOTE — Telephone Encounter (Signed)
Called and left message for wife to return call.  

## 2014-02-20 NOTE — Telephone Encounter (Signed)
Wife aware. States that she will call if he has any further episodes.

## 2014-02-22 ENCOUNTER — Ambulatory Visit (INDEPENDENT_AMBULATORY_CARE_PROVIDER_SITE_OTHER): Payer: Medicare Other | Admitting: Internal Medicine

## 2014-02-22 ENCOUNTER — Encounter: Payer: Self-pay | Admitting: Internal Medicine

## 2014-02-22 VITALS — BP 128/84 | HR 50 | Ht 71.0 in | Wt 155.0 lb

## 2014-02-22 DIAGNOSIS — I4819 Other persistent atrial fibrillation: Secondary | ICD-10-CM | POA: Insufficient documentation

## 2014-02-22 DIAGNOSIS — I5022 Chronic systolic (congestive) heart failure: Secondary | ICD-10-CM

## 2014-02-22 DIAGNOSIS — I4891 Unspecified atrial fibrillation: Secondary | ICD-10-CM | POA: Diagnosis not present

## 2014-02-22 DIAGNOSIS — R079 Chest pain, unspecified: Secondary | ICD-10-CM | POA: Insufficient documentation

## 2014-02-22 NOTE — Patient Instructions (Signed)
Your physician recommends that you schedule a follow-up appointment in: 1 year with Dr Lovena Le and 6 months with device clinic You will receive a reminder letter two months in advance reminding you to call and schedule your appointment. If you don't receive this letter, please contact our office.  Please take Nitrogycerin when you are having chest pain.  The proper use and anticipated side effects of nitroglycerine has been carefully explained.  If a single episode of chest pain is not relieved by one tablet, the patient will try another within 5 minutes; and if this doesn't relieve the pain, the patient is instructed to call 911 for transportation to an emergency department.

## 2014-02-22 NOTE — Progress Notes (Signed)
HPI [name] returns today for followup. He is a pleasant elderly man with chronic systolic CHF, non-ischemic CM, HTN and arthritis. He is s/p BiV PPM insertion. He returns today for followup. In the interim, he has done well with no sob. No syncope. He does admit to fairly frequent falls. He has not injured himself. His wife says that he gets confused at night. He has undergone workup. He has dementia. Chest pain is resolved. He denies associated dyspnea.  Allergies  Allergen Reactions  . Codeine Diarrhea     Current Outpatient Prescriptions  Medication Sig Dispense Refill  . amitriptyline (ELAVIL) 10 MG tablet Take 1 tablet (10 mg total) by mouth at bedtime.  30 tablet  2  . amLODipine (NORVASC) 2.5 MG tablet TAKE 1 TABLET (2.5 MG TOTAL) BY MOUTH DAILY.  90 tablet  0  . ciprofloxacin (CIPRO) 250 MG tablet Take 250 mg by mouth 2 (two) times daily.      . diazepam (VALIUM) 5 MG tablet Half tablet at bedtiime  30 tablet  1  . donepezil (ARICEPT) 5 MG tablet Take 5 mg by mouth at bedtime.      . dorzolamide-timolol (COSOPT) 22.3-6.8 MG/ML ophthalmic solution       . latanoprost (XALATAN) 0.005 % ophthalmic solution       . Linaclotide (LINZESS) 145 MCG CAPS capsule TAKE ONE CAPSULE BY MOUTH EVERY DAY  90 capsule  3  . Meth-Hyo-M Bl-Na Phos-Ph Sal (URIBEL) 118 MG CAPS Take 1 capsule by mouth every 8 (eight) hours.      . silodosin (RAPAFLO) 8 MG CAPS capsule Take 8 mg by mouth daily with breakfast.      . traMADol (ULTRAM) 50 MG tablet Take 50 mg by mouth.       No current facility-administered medications for this visit.     Past Medical History  Diagnosis Date  . Chronic systolic heart failure   . Rash and other nonspecific skin eruption   . Costochondritis, acute   . Glaucoma   . Erectile dysfunction   . Visual changes   . Pancytopenia   . Chronic pancreatitis     Based on CT findings  . History of melanoma in situ     JAN 2012--  LEFT HEEL W/ SLN BX  . Chronic back pain   .  IC (interstitial cystitis)   . BPH (benign prostatic hypertrophy)   . Cardiac pacemaker     CARDIOLOGIST-- DR [name] (LAST PACE [facility] 05-15-2013)  . Asymptomatic carotid artery stenosis     BILATERAL MILD ICA---  <50% PER DUPLEX 03-08-2008  . History of syncope   . Hypertension   . CHB (complete heart block)   . Nonischemic cardiomyopathy   . Severe mitral regurgitation   . Urge urinary incontinence     ROS:   All systems reviewed and negative except as noted in the HPI.   Past Surgical History  Procedure Laterality Date  . Cataract extraction, bilateral  left  1997/   right 2003  . Pacemaker insertion  12-06-2008  DR [name]    BiV PPM  --  [employer]  . Colonoscopy  08/18/2006    ASN:KNLZ granularity and erosions of the rectum of uncertain significance biopsied.  A long redundant, but otherwise normal-appearing colon.melanosi coli and minimal inflammation on bx  . Inguinal hernia repair Bilateral AS TEEN  . Interstim implant placement  2001  &  2007  . Cysto/ hod/  replacement interstim implant  05-14-2003  . Removal interstim implant/  cyst/  hod  04-14-2004  . Revision interstim implant and replace generator  05-15-2009    left upper buttock for urge urinary incontinence  . Wide excision left heel and sln bx  JUNE 2012  . Tonsillectomy  1950  . Left elbow surgery  2002  . Back surgery  X3  LAST ONE'90's  . Transthoracic echocardiogram  12-06-2008    LVSF 55-60%/  MODERATE MR/  MILD AR/  QUESTION DISTAL POSTERIOR HYPOKINESIS  . Interstim implant revision N/A 08/28/2013    Procedure: REPLACEMENT OF INTERSTIM-GENERATOR AND LEAD ;  Surgeon: Brian Packer, MD;  Location: [place];  Service: Urology;  Laterality: N/A;     Family History  Problem Relation Age of Onset  . Dementia Mother   . Throat cancer Father   . Cancer Father     throat  . Lung cancer Sister      History   Social History  . Marital Status: Married    Spouse  Name: N/A    Number of Children: N/A  . Years of Education: N/A   Occupational History  . retired     Social History Main Topics  . Smoking status: Former Smoker    Quit date: 08/24/1997  . Smokeless tobacco: Not on file  . Alcohol Use: No  . Drug Use: No  . Sexual Activity: Not on file   Other Topics Concern  . Not on file   Social History Narrative  . No narrative on file     BP 128/84  Pulse 50  Ht 5\' 11"  (1.803 m)  Wt 155 lb (70.308 kg)  BMI 21.63 kg/m2  Physical Exam:  Well appearing elderly man, NAD HEENT: Unremarkable Neck:  7 cm JVD, no thyromegally Back:  No CVA tenderness Lungs:  Clear with no wheezes HEART:  Regular rate rhythm, grade 3/6 systolic murmur, no rubs, no clicks Abd:  soft, positive bowel sounds, no organomegally, no rebound, no guarding Ext:  2 plus pulses, no edema, no cyanosis, no clubbing Skin:  No rashes no nodules Neuro:  CN II through XII intact, motor grossly intact   DEVICE  Normal device function.  See PaceArt for details.   Assess/Plan:

## 2014-02-22 NOTE — Assessment & Plan Note (Signed)
The etiology is unclear. It has resolved. As he has not been thought to have CAD in the past, I am not inclined to work up again. We will give him ntg.

## 2014-02-22 NOTE — Assessment & Plan Note (Signed)
This is a new problem. Because of his history of falls, I am not inclined to start him on systemic anti-coagulation.

## 2014-02-22 NOTE — Assessment & Plan Note (Signed)
His symptoms are class 2. No change in medical therapy.  

## 2014-03-07 DIAGNOSIS — M79609 Pain in unspecified limb: Secondary | ICD-10-CM | POA: Diagnosis not present

## 2014-03-07 DIAGNOSIS — L600 Ingrowing nail: Secondary | ICD-10-CM | POA: Diagnosis not present

## 2014-03-18 ENCOUNTER — Telehealth: Payer: Self-pay

## 2014-03-18 NOTE — Telephone Encounter (Signed)
Pt called and has been having some difficulty with swallowing for a couple of weeks. He said it is getting worse to where it is hard to swallow food and liquids at times.   I have scheduled him to see Laban Emperor, NP on 03/19/2014 at 3:00 in an urgent slot.  I advised him if he gets worse before appt to go to the ED.

## 2014-03-18 NOTE — Telephone Encounter (Signed)
Would tell him to avoid meats, chew food thoroughly and have adequate liquids on hand to assist with swallowing

## 2014-03-18 NOTE — Telephone Encounter (Signed)
I called and told pt and he said his appt was changed to 1:30 PM tomorrow since someone had cancelled.  He will see [place] then.

## 2014-03-19 ENCOUNTER — Encounter: Payer: Self-pay | Admitting: Gastroenterology

## 2014-03-19 ENCOUNTER — Ambulatory Visit: Payer: Self-pay | Admitting: Gastroenterology

## 2014-03-19 ENCOUNTER — Ambulatory Visit (INDEPENDENT_AMBULATORY_CARE_PROVIDER_SITE_OTHER): Payer: Medicare Other | Admitting: Gastroenterology

## 2014-03-19 VITALS — BP 129/84 | HR 64 | Temp 97.4°F | Ht 67.0 in | Wt 153.0 lb

## 2014-03-19 DIAGNOSIS — R1310 Dysphagia, unspecified: Secondary | ICD-10-CM | POA: Insufficient documentation

## 2014-03-19 NOTE — Patient Instructions (Signed)
We have set you up for an upper endoscopy with Dr. [name] with dilation in near future.  You may need a referral to an Ear, Nose, Throat specialist if there are no findings on the endoscopy.

## 2014-03-19 NOTE — Progress Notes (Signed)
Referring Provider: Fayrene Helper, MD Primary Care Physician:  [name], MD Primary GI: Dr. [name]   Chief Complaint  Patient presents with  . Dysphagia    x 3 weeks    HPI:   [name] presents today with new-onset dysphagia. Symptoms present for about a month. States not much taste for anything. Discomfort in throat. No odynophagia. Just states it is uncomfortable. Feels like when he eats, it doesn't "go all the way down". Pill dysphagia. Sounds hoarse. No N/V. No abdominal pain. Denies reflux. States hard to explain his symptoms. Ibuprofen for back. Every once in awhile. Likes Linzess better than mineral oil. History of dementia. Wife present. Poor historian. Declining colonoscopy on several prior occasions.   Past Medical History  Diagnosis Date  . Chronic systolic heart failure   . Rash and other nonspecific skin eruption   . Costochondritis, acute   . Glaucoma   . Erectile dysfunction   . Visual changes   . Pancytopenia   . Chronic pancreatitis     Based on CT findings  . History of melanoma in situ     JAN 2012--  LEFT HEEL W/ SLN BX  . Chronic back pain   . IC (interstitial cystitis)   . BPH (benign prostatic hypertrophy)   . Cardiac pacemaker     CARDIOLOGIST-- DR Cristopher Peru (LAST PACE [facility] 05-15-2013)  . Asymptomatic carotid artery stenosis     BILATERAL MILD ICA---  <50% PER DUPLEX 03-08-2008  . History of syncope   . Hypertension   . CHB (complete heart block)   . Nonischemic cardiomyopathy   . Severe mitral regurgitation   . Urge urinary incontinence     Past Surgical History  Procedure Laterality Date  . Cataract extraction, bilateral  left  1997/   right 2003  . Pacemaker insertion  12-06-2008  DR GREGG TAYLOR    BiV PPM  --  [employer]  . Colonoscopy  08/18/2006    AYT:KZSW granularity and erosions of the rectum of uncertain significance biopsied.  A long redundant, but otherwise normal-appearing colon.melanosi coli and minimal  inflammation on bx  . Inguinal hernia repair Bilateral AS TEEN  . Interstim implant placement  2001  &  2007  . Cysto/ hod/  replacement interstim implant  05-14-2003  . Removal interstim implant/  cyst/  hod  04-14-2004  . Revision interstim implant and replace generator  05-15-2009    left upper buttock for urge urinary incontinence  . Wide excision left heel and sln bx  JUNE 2012  . Tonsillectomy  1950  . Left elbow surgery  2002  . Back surgery  X3  LAST ONE'90's  . Transthoracic echocardiogram  12-06-2008    LVSF 55-60%/  MODERATE MR/  MILD AR/  QUESTION DISTAL POSTERIOR HYPOKINESIS  . Interstim implant revision N/A 08/28/2013    Procedure: REPLACEMENT OF INTERSTIM-GENERATOR AND LEAD ;  Surgeon: [name], MD;  Location: [place];  Service: Urology;  Laterality: N/A;    Current Outpatient Prescriptions  Medication Sig Dispense Refill  . amitriptyline (ELAVIL) 10 MG tablet Take 1 tablet (10 mg total) by mouth at bedtime.  30 tablet  2  . amLODipine (NORVASC) 2.5 MG tablet TAKE 1 TABLET (2.5 MG TOTAL) BY MOUTH DAILY.  90 tablet  0  . ciprofloxacin (CIPRO) 250 MG tablet Take 250 mg by mouth 2 (two) times daily.      . diazepam (VALIUM) 5 MG tablet  Half tablet at bedtiime  30 tablet  1  . donepezil (ARICEPT) 5 MG tablet Take 5 mg by mouth at bedtime.      . dorzolamide-timolol (COSOPT) 22.3-6.8 MG/ML ophthalmic solution       . fentaNYL (DURAGESIC - DOSED MCG/HR) 50 MCG/HR Place 50 mcg onto the skin every 3 (three) days.      Marland Kitchen ibuprofen (ADVIL,MOTRIN) 200 MG tablet Take 200 mg by mouth as needed.      . latanoprost (XALATAN) 0.005 % ophthalmic solution       . Linaclotide (LINZESS) 145 MCG CAPS capsule TAKE ONE CAPSULE BY MOUTH EVERY DAY  90 capsule  3  . Meth-Hyo-M Bl-Na Phos-Ph Sal (URIBEL) 118 MG CAPS Take 1 capsule by mouth every 8 (eight) hours.      . traMADol (ULTRAM) 50 MG tablet Take 50 mg by mouth.      . silodosin (RAPAFLO) 8 MG CAPS capsule Take  8 mg by mouth daily with breakfast.       No current facility-administered medications for this visit.    Allergies as of 03/19/2014 - Review Complete 03/19/2014  Allergen Reaction Noted  . Codeine Diarrhea 01/31/2008    Family History  Problem Relation Age of Onset  . Dementia Mother   . Throat cancer Father   . Cancer Father     throat  . Lung cancer Sister     History   Social History  . Marital Status: Married    Spouse Name: N/A    Number of Children: N/A  . Years of Education: N/A   Occupational History  . retired     Social History Main Topics  . Smoking status: Former Smoker    Quit date: 08/24/1997  . Smokeless tobacco: None  . Alcohol Use: No  . Drug Use: No  . Sexual Activity: None   Other Topics Concern  . None   Social History Narrative  . None    Review of Systems: Gen: see HPI CV: Denies chest pain, palpitations, syncope, peripheral edema, and claudication. Resp: +DOE GI: see HPI Derm: Denies rash, itching, dry skin Psych: +confusion, memory loss  Heme: Denies bruising, bleeding, and enlarged lymph nodes.  Physical Exam: BP 129/84  Pulse 64  Temp(Src) 97.4 F (36.3 C) (Oral)  Ht 5\' 7"  (1.702 m)  Wt 153 lb (69.4 kg)  BMI 23.96 kg/m2 General:   Alert and oriented to person, place, year. Some hesitation when responding.  Head:  Normocephalic and atraumatic. Eyes:  Conjuctiva clear without scleral icterus. Mouth:  Oral mucosa pink and moist.  Neck:  Supple, without mass or thyromegaly. Heart:  S1, S2 present without murmurs,  Abdomen:  +BS, soft, non-tender and non-distended. No rebound or guarding. No HSM or masses noted. Msk:  Symmetrical without gross deformities. Normal posture. Extremities:  Without edema. Neurologic:  Alert and  oriented x4 Skin:  Intact without significant lesions or rashes. Psych:  Alert and cooperative. Normal mood and affect.

## 2014-03-20 ENCOUNTER — Encounter (HOSPITAL_COMMUNITY): Payer: Self-pay | Admitting: Pharmacy Technician

## 2014-03-21 ENCOUNTER — Telehealth: Payer: Self-pay | Admitting: *Deleted

## 2014-03-21 NOTE — Assessment & Plan Note (Signed)
80 year old male with new-onset dysphagia. Vague historian but wife does note patient complains of hoarseness and notes his voice has changed. Father with history of throat cancer. Will proceed with upper endoscopy to rule out occult process; recommend subsequent ENT evaluation of negative EGD.   Proceed with upper endoscopy and dilation in the near future with Dr. [name]. The risks, benefits, and alternatives have been discussed in detail with patient. They have stated understanding and desire to proceed.  ENT referral if negative EGD

## 2014-03-21 NOTE — Telephone Encounter (Signed)
Pt's wife called stating they were confused and pt is having a EGD tomorrow I made pt's wife aware that his procedure is 03/28/14 with Dr. [name] and he needs to be in short stay at 2:00. I made pt's wife aware that pt has a appt with Dr. Jeffie Pollock tomorrow at 10:15. Per wife they got confused on the appointments and understands now that pt needs to see Dr. Jeffie Pollock tomorrow and Dr. [name] next Thursday 03/28/14 for his EGD. I told her if they had anymore questions to please call the office.

## 2014-03-22 ENCOUNTER — Ambulatory Visit (INDEPENDENT_AMBULATORY_CARE_PROVIDER_SITE_OTHER): Payer: Medicare Other | Admitting: Urology

## 2014-03-22 DIAGNOSIS — R300 Dysuria: Secondary | ICD-10-CM

## 2014-03-22 DIAGNOSIS — N3010 Interstitial cystitis (chronic) without hematuria: Secondary | ICD-10-CM | POA: Diagnosis not present

## 2014-03-22 DIAGNOSIS — N411 Chronic prostatitis: Secondary | ICD-10-CM | POA: Diagnosis not present

## 2014-03-25 NOTE — Progress Notes (Signed)
cc'd to pcp 

## 2014-03-28 ENCOUNTER — Ambulatory Visit (HOSPITAL_COMMUNITY)
Admission: RE | Admit: 2014-03-28 | Discharge: 2014-03-28 | Disposition: A | Discharge status: Home / Self Care | Payer: Medicare Other | Source: Ambulatory Visit | Attending: Internal Medicine | Admitting: Internal Medicine

## 2014-03-28 ENCOUNTER — Encounter (HOSPITAL_COMMUNITY)
Admission: RE | Disposition: A | Discharge status: Home / Self Care | Payer: Self-pay | Source: Ambulatory Visit | Attending: Internal Medicine

## 2014-03-28 ENCOUNTER — Encounter (HOSPITAL_COMMUNITY): Payer: Self-pay

## 2014-03-28 DIAGNOSIS — R490 Dysphonia: Secondary | ICD-10-CM | POA: Insufficient documentation

## 2014-03-28 DIAGNOSIS — I10 Essential (primary) hypertension: Secondary | ICD-10-CM | POA: Diagnosis not present

## 2014-03-28 DIAGNOSIS — Z87891 Personal history of nicotine dependence: Secondary | ICD-10-CM | POA: Insufficient documentation

## 2014-03-28 DIAGNOSIS — R1310 Dysphagia, unspecified: Secondary | ICD-10-CM | POA: Diagnosis not present

## 2014-03-28 DIAGNOSIS — K2940 Chronic atrophic gastritis without bleeding: Secondary | ICD-10-CM | POA: Insufficient documentation

## 2014-03-28 DIAGNOSIS — K2970 Gastritis, unspecified, without bleeding: Secondary | ICD-10-CM | POA: Diagnosis not present

## 2014-03-28 DIAGNOSIS — K2990 Gastroduodenitis, unspecified, without bleeding: Secondary | ICD-10-CM | POA: Diagnosis not present

## 2014-03-28 DIAGNOSIS — Z79899 Other long term (current) drug therapy: Secondary | ICD-10-CM | POA: Diagnosis not present

## 2014-03-28 HISTORY — PX: MALONEY DILATION: SHX5535

## 2014-03-28 HISTORY — PX: ESOPHAGOGASTRODUODENOSCOPY: SHX5428

## 2014-03-28 HISTORY — PX: SAVORY DILATION: SHX5439

## 2014-03-28 SURGERY — EGD (ESOPHAGOGASTRODUODENOSCOPY)
Anesthesia: Moderate Sedation

## 2014-03-28 MED ORDER — MEPERIDINE HCL 100 MG/ML IJ SOLN
INTRAMUSCULAR | Status: AC
  Filled 2014-03-28: qty 2

## 2014-03-28 MED ORDER — ONDANSETRON HCL 4 MG/2ML IJ SOLN
INTRAMUSCULAR | Status: AC
  Filled 2014-03-28: qty 2

## 2014-03-28 MED ORDER — MIDAZOLAM HCL 5 MG/5ML IJ SOLN
INTRAMUSCULAR | Status: AC
  Filled 2014-03-28: qty 10

## 2014-03-28 MED ORDER — SODIUM CHLORIDE 0.9 % IV SOLN
INTRAVENOUS | Status: DC
  Administered 2014-03-28: 07:00:00 via INTRAVENOUS

## 2014-03-28 MED ORDER — STERILE WATER FOR IRRIGATION IR SOLN
Status: DC | PRN
  Administered 2014-03-28: 08:00:00

## 2014-03-28 MED ORDER — MEPERIDINE HCL 100 MG/ML IJ SOLN
INTRAMUSCULAR | Status: DC | PRN
  Administered 2014-03-28: 25 mg via INTRAVENOUS

## 2014-03-28 MED ORDER — LIDOCAINE VISCOUS 2 % MT SOLN
OROMUCOSAL | Status: DC | PRN
  Administered 2014-03-28: 3 mL via OROMUCOSAL

## 2014-03-28 MED ORDER — LIDOCAINE VISCOUS 2 % MT SOLN
OROMUCOSAL | Status: AC
  Filled 2014-03-28: qty 15

## 2014-03-28 MED ORDER — MIDAZOLAM HCL 5 MG/5ML IJ SOLN
INTRAMUSCULAR | Status: DC | PRN
  Administered 2014-03-28: 2 mg via INTRAVENOUS

## 2014-03-28 MED ORDER — ONDANSETRON HCL 4 MG/2ML IJ SOLN
INTRAMUSCULAR | Status: DC | PRN
  Administered 2014-03-28: 4 mg via INTRAVENOUS

## 2014-03-28 NOTE — H&P (View-Only) (Signed)
Referring Provider: Fayrene Helper, MD Primary Care Physician:  [name], MD Primary GI: Dr. [name]   Chief Complaint  Patient presents with  . Dysphagia    x 3 weeks    HPI:   [name] presents today with new-onset dysphagia. Symptoms present for about a month. States not much taste for anything. Discomfort in throat. No odynophagia. Just states it is uncomfortable. Feels like when he eats, it doesn't "go all the way down". Pill dysphagia. Sounds hoarse. No N/V. No abdominal pain. Denies reflux. States hard to explain his symptoms. Ibuprofen for back. Every once in awhile. Likes Linzess better than mineral oil. History of dementia. Wife present. Poor historian. Declining colonoscopy on several prior occasions.   Past Medical History  Diagnosis Date  . Chronic systolic heart failure   . Rash and other nonspecific skin eruption   . Costochondritis, acute   . Glaucoma   . Erectile dysfunction   . Visual changes   . Pancytopenia   . Chronic pancreatitis     Based on CT findings  . History of melanoma in situ     JAN 2012--  LEFT HEEL W/ SLN BX  . Chronic back pain   . IC (interstitial cystitis)   . BPH (benign prostatic hypertrophy)   . Cardiac pacemaker     CARDIOLOGIST-- DR Cristopher Peru (LAST PACE [facility] 05-15-2013)  . Asymptomatic carotid artery stenosis     BILATERAL MILD ICA---  <50% PER DUPLEX 03-08-2008  . History of syncope   . Hypertension   . CHB (complete heart block)   . Nonischemic cardiomyopathy   . Severe mitral regurgitation   . Urge urinary incontinence     Past Surgical History  Procedure Laterality Date  . Cataract extraction, bilateral  left  1997/   right 2003  . Pacemaker insertion  12-06-2008  DR GREGG TAYLOR    BiV PPM  --  [employer]  . Colonoscopy  08/18/2006    BHA:LPFX granularity and erosions of the rectum of uncertain significance biopsied.  A long redundant, but otherwise normal-appearing colon.melanosi coli and minimal  inflammation on bx  . Inguinal hernia repair Bilateral AS TEEN  . Interstim implant placement  2001  &  2007  . Cysto/ hod/  replacement interstim implant  05-14-2003  . Removal interstim implant/  cyst/  hod  04-14-2004  . Revision interstim implant and replace generator  05-15-2009    left upper buttock for urge urinary incontinence  . Wide excision left heel and sln bx  JUNE 2012  . Tonsillectomy  1950  . Left elbow surgery  2002  . Back surgery  X3  LAST ONE'90's  . Transthoracic echocardiogram  12-06-2008    LVSF 55-60%/  MODERATE MR/  MILD AR/  QUESTION DISTAL POSTERIOR HYPOKINESIS  . Interstim implant revision N/A 08/28/2013    Procedure: REPLACEMENT OF INTERSTIM-GENERATOR AND LEAD ;  Surgeon: [name], MD;  Location: [place];  Service: Urology;  Laterality: N/A;    Current Outpatient Prescriptions  Medication Sig Dispense Refill  . amitriptyline (ELAVIL) 10 MG tablet Take 1 tablet (10 mg total) by mouth at bedtime.  30 tablet  2  . amLODipine (NORVASC) 2.5 MG tablet TAKE 1 TABLET (2.5 MG TOTAL) BY MOUTH DAILY.  90 tablet  0  . ciprofloxacin (CIPRO) 250 MG tablet Take 250 mg by mouth 2 (two) times daily.      . diazepam (VALIUM) 5 MG tablet  Half tablet at bedtiime  30 tablet  1  . donepezil (ARICEPT) 5 MG tablet Take 5 mg by mouth at bedtime.      . dorzolamide-timolol (COSOPT) 22.3-6.8 MG/ML ophthalmic solution       . fentaNYL (DURAGESIC - DOSED MCG/HR) 50 MCG/HR Place 50 mcg onto the skin every 3 (three) days.      Marland Kitchen ibuprofen (ADVIL,MOTRIN) 200 MG tablet Take 200 mg by mouth as needed.      . latanoprost (XALATAN) 0.005 % ophthalmic solution       . Linaclotide (LINZESS) 145 MCG CAPS capsule TAKE ONE CAPSULE BY MOUTH EVERY DAY  90 capsule  3  . Meth-Hyo-M Bl-Na Phos-Ph Sal (URIBEL) 118 MG CAPS Take 1 capsule by mouth every 8 (eight) hours.      . traMADol (ULTRAM) 50 MG tablet Take 50 mg by mouth.      . silodosin (RAPAFLO) 8 MG CAPS capsule Take  8 mg by mouth daily with breakfast.       No current facility-administered medications for this visit.    Allergies as of 03/19/2014 - Review Complete 03/19/2014  Allergen Reaction Noted  . Codeine Diarrhea 01/31/2008    Family History  Problem Relation Age of Onset  . Dementia Mother   . Throat cancer Father   . Cancer Father     throat  . Lung cancer Sister     History   Social History  . Marital Status: Married    Spouse Name: N/A    Number of Children: N/A  . Years of Education: N/A   Occupational History  . retired     Social History Main Topics  . Smoking status: Former Smoker    Quit date: 08/24/1997  . Smokeless tobacco: None  . Alcohol Use: No  . Drug Use: No  . Sexual Activity: None   Other Topics Concern  . None   Social History Narrative  . None    Review of Systems: Gen: see HPI CV: Denies chest pain, palpitations, syncope, peripheral edema, and claudication. Resp: +DOE GI: see HPI Derm: Denies rash, itching, dry skin Psych: +confusion, memory loss  Heme: Denies bruising, bleeding, and enlarged lymph nodes.  Physical Exam: BP 129/84  Pulse 64  Temp(Src) 97.4 F (36.3 C) (Oral)  Ht 5\' 7"  (1.702 m)  Wt 153 lb (69.4 kg)  BMI 23.96 kg/m2 General:   Alert and oriented to person, place, year. Some hesitation when responding.  Head:  Normocephalic and atraumatic. Eyes:  Conjuctiva clear without scleral icterus. Mouth:  Oral mucosa pink and moist.  Neck:  Supple, without mass or thyromegaly. Heart:  S1, S2 present without murmurs,  Abdomen:  +BS, soft, non-tender and non-distended. No rebound or guarding. No HSM or masses noted. Msk:  Symmetrical without gross deformities. Normal posture. Extremities:  Without edema. Neurologic:  Alert and  oriented x4 Skin:  Intact without significant lesions or rashes. Psych:  Alert and cooperative. Normal mood and affect.

## 2014-03-28 NOTE — Op Note (Signed)
[facility] [address] [place], [address]   ENDOSCOPY PROCEDURE REPORT  PATIENT: Brian, [name]  MR#: [id number] BIRTHDATE: 07-07-1926 , 17  yrs. old GENDER: Male ENDOSCOPIST: R.  Garfield Cornea, MD FACP FACG REFERRED BY:  [name], M.D. PROCEDURE DATE:  03/28/2014 PROCEDURE:     EGD with Venia Minks dilation followed by gastric biopsy  INDICATIONS:     Date esophageal dysphagia; patient also has new symptom of hoarseness  INFORMED CONSENT:   The risks, benefits, limitations, alternatives and imponderables have been discussed.  The potential for biopsy, esophogeal dilation, etc. have also been reviewed.  Questions have been answered.  All parties agreeable.  Please see the history and physical in the medical record for more information.  MEDICATIONS:  Versed 2 mg IVand Demerol 25 mg IV.  Xylocaine gel orally. Zofran 4 mg IV  DESCRIPTION OF PROCEDURE:   The EC-3890Li ([id number]) and EG-[id number] ([id number])  endoscope was introduced through the mouth and advanced to the second portion of the duodenum without difficulty or limitations.  The mucosal surfaces were surveyed very carefully during advancement of the scope and upon withdrawal.  Retroflexion view of the proximal stomach and esophagogastric junction was performed.      FINDINGS: Slight asymmetry of the arytenoids. No mass in the hypopharynx. Esophageal mucosa appeared normal throughout its course. Tubular esophagus was patent throughout its course. Stomach empty aside from "Blue" tinged mucosa likely the remnant of Urebel which he ingested earlier today.  Gastric mucosa slightly "mosaic" in appearance. No ulcer. No infiltrating process. Patent pylorus. Normal first and second portion of the duodenum.  THERAPEUTIC / DIAGNOSTIC MANEUVERS PERFORMED:  A 56 French Maloney dilator was passed to full insertion easily. A look back revealed no apparent complication related to this maneuver.  Subsequently, biopsies the abnormal-appearing gastric mucosa taken for histologic study.   COMPLICATIONS:  None  IMPRESSION:  Normal esophagus-status post passage of the [facility] dilator. Subtly asymmetric arytenoids of uncertain significance. Abnormal gastric mucosa of doubtful clinical significance-status post gastric biopsy  RECOMMENDATIONS:  Followup on pathology. Patient needs an ENT evaluation regarding hoarseness.    _______________________________ R. Garfield Cornea, MD FACP [facility] eSigned:  R. Garfield Cornea, MD FACP [facility] 03/28/2014 8:11 AM     CC:  PATIENT NAME:  [name], [name] MR#: [id number]

## 2014-03-28 NOTE — Interval H&P Note (Signed)
History and Physical Interval Note:  03/28/2014 7:37 AM  [name]  has presented today for surgery, with the diagnosis of DYSPHAGIA  The various methods of treatment have been discussed with the patient and family. After consideration of risks, benefits and other options for treatment, the patient has consented to  Procedure(s) with comments: ESOPHAGOGASTRODUODENOSCOPY (EGD) (N/A) - 3:00-moved to [place] notfied pt SAVORY DILATION (N/A) MALONEY DILATION (N/A) as a surgical intervention .  The patient's history has been reviewed, patient examined, no change in status, stable for surgery.  I have reviewed the patient's chart and labs.  Questions were answered to the patient's satisfaction.     Robert Rourk  No change. EGD with dilation as appropriate.  The risks, benefits, limitations, alternatives and imponderables have been reviewed with the patient. Potential for esophageal dilation, biopsy, etc. have also been reviewed.  Questions have been answered. All parties agreeable.

## 2014-03-28 NOTE — Discharge Instructions (Signed)
EGD Discharge instructions Please read the instructions outlined below and refer to this sheet in the next few weeks. These discharge instructions provide you with general information on caring for yourself after you leave the hospital. Your doctor may also give you specific instructions. While your treatment has been planned according to the most current medical practices available, unavoidable complications occasionally occur. If you have any problems or questions after discharge, please call your doctor. ACTIVITY  You may resume your regular activity but move at a slower pace for the next 24 hours.   Take frequent rest periods for the next 24 hours.   Walking will help expel (get rid of) the air and reduce the bloated feeling in your abdomen.   No driving for 24 hours (because of the anesthesia (medicine) used during the test).   You may shower.   Do not sign any important legal documents or operate any machinery for 24 hours (because of the anesthesia used during the test).  NUTRITION  Drink plenty of fluids.   You may resume your normal diet.   Begin with a light meal and progress to your normal diet.   Avoid alcoholic beverages for 24 hours or as instructed by your caregiver.  MEDICATIONS  You may resume your normal medications unless your caregiver tells you otherwise.  WHAT YOU CAN EXPECT TODAY  You may experience abdominal discomfort such as a feeling of fullness or gas pains.  FOLLOW-UP  Your doctor will discuss the results of your test with you.  SEEK IMMEDIATE MEDICAL ATTENTION IF ANY OF THE FOLLOWING OCCUR:  Excessive nausea (feeling sick to your stomach) and/or vomiting.   Severe abdominal pain and distention (swelling).   Trouble swallowing.   Temperature over 101 F (37.8 C).   Rectal bleeding or vomiting of blood.    No explanation found for hoarseness. Your esophagus was dilated today.  You need to see an ear, nose and throat specialist regarding  hoarseness  Your stomach was biopsied. Further recommendations to follow pending review of path report

## 2014-03-29 ENCOUNTER — Encounter (HOSPITAL_COMMUNITY): Payer: Self-pay | Admitting: Internal Medicine

## 2014-03-30 ENCOUNTER — Other Ambulatory Visit: Payer: Self-pay | Admitting: Family Medicine

## 2014-04-02 ENCOUNTER — Other Ambulatory Visit: Payer: Self-pay

## 2014-04-02 MED ORDER — FENTANYL 50 MCG/HR TD PT72: 50.0000 ug | MEDICATED_PATCH | TRANSDERMAL | Status: DC

## 2014-04-02 MED ORDER — FENTANYL 25 MCG/HR TD PT72: 50.0000 ug | MEDICATED_PATCH | TRANSDERMAL | Status: DC

## 2014-04-03 ENCOUNTER — Encounter: Payer: Self-pay | Admitting: Internal Medicine

## 2014-04-10 DIAGNOSIS — Z961 Presence of intraocular lens: Secondary | ICD-10-CM | POA: Diagnosis not present

## 2014-04-10 DIAGNOSIS — H26499 Other secondary cataract, unspecified eye: Secondary | ICD-10-CM | POA: Diagnosis not present

## 2014-04-10 DIAGNOSIS — H4011X0 Primary open-angle glaucoma, stage unspecified: Secondary | ICD-10-CM | POA: Diagnosis not present

## 2014-04-15 ENCOUNTER — Ambulatory Visit: Payer: Medicare Other | Admitting: Family Medicine

## 2014-04-22 DIAGNOSIS — M25569 Pain in unspecified knee: Secondary | ICD-10-CM | POA: Diagnosis not present

## 2014-04-22 DIAGNOSIS — M545 Low back pain, unspecified: Secondary | ICD-10-CM | POA: Diagnosis not present

## 2014-04-22 DIAGNOSIS — M25519 Pain in unspecified shoulder: Secondary | ICD-10-CM | POA: Diagnosis not present

## 2014-04-25 ENCOUNTER — Other Ambulatory Visit: Payer: Self-pay

## 2014-04-25 MED ORDER — FENTANYL 25 MCG/HR TD PT72: 50.0000 ug | MEDICATED_PATCH | TRANSDERMAL | Status: DC

## 2014-05-09 ENCOUNTER — Other Ambulatory Visit: Payer: Self-pay | Admitting: Family Medicine

## 2014-05-20 ENCOUNTER — Ambulatory Visit (INDEPENDENT_AMBULATORY_CARE_PROVIDER_SITE_OTHER): Payer: Medicare Other | Admitting: Family Medicine

## 2014-05-20 ENCOUNTER — Encounter (INDEPENDENT_AMBULATORY_CARE_PROVIDER_SITE_OTHER): Payer: Self-pay

## 2014-05-20 ENCOUNTER — Encounter: Payer: Self-pay | Admitting: Family Medicine

## 2014-05-20 VITALS — BP 128/76 | HR 62 | Resp 18 | Ht 68.0 in | Wt 162.0 lb

## 2014-05-20 DIAGNOSIS — I10 Essential (primary) hypertension: Secondary | ICD-10-CM

## 2014-05-20 DIAGNOSIS — F0280 Dementia in other diseases classified elsewhere without behavioral disturbance: Secondary | ICD-10-CM

## 2014-05-20 DIAGNOSIS — M159 Polyosteoarthritis, unspecified: Secondary | ICD-10-CM

## 2014-05-20 DIAGNOSIS — Z0000 Encounter for general adult medical examination without abnormal findings: Secondary | ICD-10-CM

## 2014-05-20 DIAGNOSIS — G309 Alzheimer's disease, unspecified: Secondary | ICD-10-CM

## 2014-05-20 DIAGNOSIS — Z23 Encounter for immunization: Secondary | ICD-10-CM

## 2014-05-20 DIAGNOSIS — R262 Difficulty in walking, not elsewhere classified: Secondary | ICD-10-CM

## 2014-05-20 MED ORDER — FENTANYL 25 MCG/HR TD PT72: MEDICATED_PATCH | TRANSDERMAL | Status: DC

## 2014-05-20 NOTE — Patient Instructions (Addendum)
Annual wellness in December, call if you need me before  Prevnar today  Call in October for flu vaccine   You are doing better.  For pain use fentanyl patch 19mcg ONE every three days , and use tylenol 325 mg one table daily, or one tablet twice daily

## 2014-05-26 DIAGNOSIS — Z23 Encounter for immunization: Secondary | ICD-10-CM | POA: Insufficient documentation

## 2014-05-26 NOTE — Assessment & Plan Note (Signed)
Currently on no medication, self imposed. No concern of behavioral issues per spouse at this time

## 2014-05-26 NOTE — Assessment & Plan Note (Signed)
Controlled, no change in medication  

## 2014-05-26 NOTE — Assessment & Plan Note (Signed)
Vaccine administered.

## 2014-05-26 NOTE — Progress Notes (Signed)
   Subjective:    Patient ID: [name], male    DOB: 1926/01/22, 80 y.o.   MRN: [id number]  HPI The PT is here for follow up and re-evaluation of chronic medical conditions, medication management and review of any available recent lab and radiology data.  Preventive health is updated, specifically   Immunization.   Questions or concerns regarding consultations or procedures which the PT has had in the interim are  Addressed.Still followed by orthopedics, GI, hematology The PT denies any adverse reactions to current medications since the last visit.  C/o ongoing and uncontrolled lower extremity pain and weakness    Review of Systems See HPI Denies recent fever or chills. Denies sinus pressure, nasal congestion, ear pain or sore throat. Denies chest congestion, productive cough or wheezing. Denies chest pains, palpitations and leg swelling Denies abdominal pain, nausea, vomiting,diarrhea or constipation.   Denies headaches, seizures, numbness, or tingling. Denies depression, does have  anxietyand r insomnia. Denies skin break down or rash.        Objective:   Physical Exam BP 128/76  Pulse 62  Resp 18  Ht 5\' 8"  (1.727 m)  Wt 162 lb (73.483 kg)  BMI 24.64 kg/m2  SpO2 93% Patient alert and oriented and in no cardiopulmonary distress.  HEENT: No facial asymmetry, EOMI,   oropharynx pink and moist.  Neck decreased ROM no JVD, no mass.  Chest: Clear to auscultation bilaterally.  CVS: S1, S2 diastolic murmur, no S3.Regular rate.  ABD: Soft non tender.   Ext: No edema  MS: decreased  ROM spine, shoulders, hips and knees.  Skin: Intact, no ulcerations or rash noted.  Psych: Good eye contact, normal affect. Memory loss not anxious or depressed appearing.  CNS: CN 2-12 intact, power,  normal throughout.no focal deficits noted.        Assessment & Plan:  GEN OSTEOARTHROSIS INVOLVING MULTIPLE SITES Ongoing chronic pain in multiple joints with limitation in  mobility Pt to use fenatnyl q 3 days 46mcg dose , discouraged from continuing tramadol along with this  HYPERTENSION Controlled, no change in medication   Need for vaccination with 13-polyvalent pneumococcal conjugate vaccine Vaccine administered  Alzheimer's dementia Currently on no medication, self imposed. No concern of behavioral issues per spouse at this time  Difficulty walking Improved following PT

## 2014-05-26 NOTE — Assessment & Plan Note (Signed)
Ongoing chronic pain in multiple joints with limitation in mobility Pt to use fenatnyl q 3 days 21mcg dose , discouraged from continuing tramadol along with this

## 2014-05-26 NOTE — Assessment & Plan Note (Signed)
Improved following PT

## 2014-05-27 ENCOUNTER — Ambulatory Visit (INDEPENDENT_AMBULATORY_CARE_PROVIDER_SITE_OTHER): Payer: Medicare Other

## 2014-05-27 ENCOUNTER — Other Ambulatory Visit: Payer: Self-pay

## 2014-05-27 ENCOUNTER — Telehealth: Payer: Self-pay

## 2014-05-27 DIAGNOSIS — Z23 Encounter for immunization: Secondary | ICD-10-CM

## 2014-05-27 MED ORDER — TRAZODONE 25 MG HALF TABLET: 25.0000 mg | ORAL_TABLET | Freq: Every day | ORAL | Status: DC

## 2014-05-28 NOTE — Telephone Encounter (Signed)
pls send trazodone 25 mg at bedtime, seroquel is an error, trazodone is med requested at this dose, thanks for sending correct med and dose

## 2014-05-28 NOTE — Telephone Encounter (Signed)
pls send in seroquel

## 2014-06-06 ENCOUNTER — Encounter: Payer: Self-pay | Admitting: Internal Medicine

## 2014-06-07 ENCOUNTER — Ambulatory Visit (INDEPENDENT_AMBULATORY_CARE_PROVIDER_SITE_OTHER): Payer: Medicare Other | Admitting: Urology

## 2014-06-07 DIAGNOSIS — R351 Nocturia: Secondary | ICD-10-CM

## 2014-06-07 DIAGNOSIS — N3010 Interstitial cystitis (chronic) without hematuria: Secondary | ICD-10-CM | POA: Diagnosis not present

## 2014-06-11 ENCOUNTER — Telehealth: Payer: Self-pay | Admitting: *Deleted

## 2014-06-11 ENCOUNTER — Telehealth: Payer: Self-pay | Admitting: Family Medicine

## 2014-06-11 DIAGNOSIS — M79605 Pain in left leg: Principal | ICD-10-CM

## 2014-06-11 DIAGNOSIS — M79604 Pain in right leg: Secondary | ICD-10-CM

## 2014-06-11 NOTE — Telephone Encounter (Signed)
Pt's wife called and LMOM that pt's feet are swelling they think it is gout, per wife pt is not feeling good, Pt's wife would like for the nurse to call her back. Please advise [phone]

## 2014-06-11 NOTE — Telephone Encounter (Signed)
Pls contact his wife , and find out if he is still having a lot of behavioral disturbance I got a note from urologist suggesting he is having increased confusion and hallucinations on elavil and trazodone  , if this is the case then he needs to get help from psychiatry with management of his dementia / psychotic behavior. I would also suggest stopping elavil , OK to keep the  Trazodone, pLS get back to me before a decision is made, thanks

## 2014-06-12 DIAGNOSIS — M79609 Pain in unspecified limb: Secondary | ICD-10-CM | POA: Diagnosis not present

## 2014-06-12 MED ORDER — FUROSEMIDE 20 MG PO TABS: 20.0000 mg | ORAL_TABLET | Freq: Every day | ORAL | Status: DC | PRN

## 2014-06-12 MED ORDER — POTASSIUM CHLORIDE CRYS ER 20 MEQ PO TBCR: 20.0000 meq | EXTENDED_RELEASE_TABLET | Freq: Every day | ORAL | Status: DC | PRN

## 2014-06-12 NOTE — Telephone Encounter (Signed)
Patient aware and will come by and have labs done and legs accessed.

## 2014-06-12 NOTE — Addendum Note (Signed)
Addended by: Denman George B on: 06/12/2014 11:56 AM   Modules accepted: Orders

## 2014-06-12 NOTE — Telephone Encounter (Signed)
Wife called stating that patient has pain in both feet with swelling.  Would like to know if something can be sent in for gout

## 2014-06-12 NOTE — Telephone Encounter (Signed)
Wife states that behaviors are better right now.  She will contact behavioral health for an appointment though

## 2014-06-12 NOTE — Addendum Note (Signed)
Addended by: Denman George B on: 06/12/2014 03:07 PM   Modules accepted: Orders

## 2014-06-12 NOTE — Telephone Encounter (Signed)
Order uric acid level, nurse to eval leg swelling

## 2014-06-13 LAB — URIC ACID: Uric Acid, Serum: 4.9 mg/dL (ref 4.0–7.8)

## 2014-06-14 ENCOUNTER — Other Ambulatory Visit: Payer: Self-pay

## 2014-06-14 DIAGNOSIS — M159 Polyosteoarthritis, unspecified: Secondary | ICD-10-CM

## 2014-06-14 MED ORDER — FENTANYL 25 MCG/HR TD PT72: MEDICATED_PATCH | TRANSDERMAL | Status: DC

## 2014-06-19 ENCOUNTER — Telehealth: Payer: Self-pay | Admitting: Family Medicine

## 2014-06-20 NOTE — Telephone Encounter (Signed)
Called wife and clarified labs with her for patient.  She is now aware that the uric acid was normal.

## 2014-07-01 ENCOUNTER — Other Ambulatory Visit: Payer: Self-pay

## 2014-07-01 MED ORDER — FUROSEMIDE 20 MG PO TABS: 20.0000 mg | ORAL_TABLET | Freq: Every day | ORAL | Status: DC | PRN

## 2014-07-05 ENCOUNTER — Other Ambulatory Visit: Payer: Self-pay

## 2014-07-05 ENCOUNTER — Other Ambulatory Visit: Payer: Self-pay | Admitting: Internal Medicine

## 2014-07-05 ENCOUNTER — Encounter: Payer: Self-pay | Admitting: Internal Medicine

## 2014-07-05 ENCOUNTER — Ambulatory Visit (INDEPENDENT_AMBULATORY_CARE_PROVIDER_SITE_OTHER): Payer: Medicare Other | Admitting: Internal Medicine

## 2014-07-05 VITALS — BP 130/84 | HR 71 | Ht 71.0 in | Wt 163.0 lb

## 2014-07-05 DIAGNOSIS — I5022 Chronic systolic (congestive) heart failure: Secondary | ICD-10-CM | POA: Diagnosis not present

## 2014-07-05 DIAGNOSIS — I4891 Unspecified atrial fibrillation: Secondary | ICD-10-CM | POA: Diagnosis not present

## 2014-07-05 DIAGNOSIS — Z950 Presence of cardiac pacemaker: Secondary | ICD-10-CM | POA: Diagnosis not present

## 2014-07-05 LAB — MDC_IDC_ENUM_SESS_TYPE_INCLINIC
Battery Voltage: 2.94 V
Brady Statistic AP VP Percent: 40 %
Brady Statistic AS VP Percent: 37 %
Brady Statistic AS VS Percent: 23 %
Date Time Interrogation Session: 20151002094640
Implantable Pulse Generator Model: 8042
Lead Channel Impedance Value: 650 Ohm
Lead Channel Pacing Threshold Amplitude: 1 V
Lead Channel Pacing Threshold Pulse Width: 0.4 ms
Lead Channel Pacing Threshold Pulse Width: 0.4 ms
Lead Channel Sensing Intrinsic Amplitude: 0.7 mV
Lead Channel Setting Pacing Amplitude: 2 V
Lead Channel Setting Pacing Pulse Width: 0.4 ms
Lead Channel Setting Sensing Sensitivity: 2.8 mV
MDC IDC MSMT BATTERY REMAINING LONGEVITY: 30 mo
MDC IDC MSMT LEADCHNL RA IMPEDANCE VALUE: 393 Ohm
MDC IDC MSMT LEADCHNL RV IMPEDANCE VALUE: 471 Ohm
MDC IDC MSMT LEADCHNL RV PACING THRESHOLD AMPLITUDE: 1 V
MDC IDC MSMT LEADCHNL RV SENSING INTR AMPL: 11.2 mV
MDC IDC SET LEADCHNL RA PACING AMPLITUDE: 2 V
MDC IDC SET LEADCHNL RV PACING AMPLITUDE: 2.5 V
MDC IDC SET LEADCHNL RV PACING PULSEWIDTH: 0.4 ms
MDC IDC STAT BRADY AP VS PERCENT: 0 %

## 2014-07-05 LAB — PACEMAKER DEVICE OBSERVATION

## 2014-07-05 MED ORDER — AMLODIPINE BESYLATE 2.5 MG PO TABS: ORAL_TABLET | ORAL | Status: DC

## 2014-07-05 MED ORDER — AMITRIPTYLINE HCL 10 MG PO TABS: ORAL_TABLET | ORAL | Status: DC

## 2014-07-05 MED ORDER — AMIODARONE HCL 200 MG PO TABS: 200.0000 mg | ORAL_TABLET | Freq: Every day | ORAL | Status: DC

## 2014-07-05 MED ORDER — RIVAROXABAN 20 MG PO TABS: 20.0000 mg | ORAL_TABLET | Freq: Every day | ORAL | Status: DC

## 2014-07-05 NOTE — Patient Instructions (Addendum)
Your physician wants you to follow-up in: 3 months with Dr. Lovena Le   Your physician has recommended you make the following change in your medication:   START XARELTO 20 MG DAILY WITH SUPPER  START AMIODARONE 200 MG DAILY  Thank you for choosing [facility]!!

## 2014-07-05 NOTE — Assessment & Plan Note (Signed)
His atrial fib has gone from paroxysmal to persistent. I am concerned about stroke. He is a candidate for systemic anti-coagulation. His kidney function is normal and will start Xarelto. Because he is more sob and not pacing, and in atrial fib all of the time, I am going to start amiodarone 200 mg daily. If he has not spontaneously converted when I see him in 3 months, will plan a cardioversion.

## 2014-07-05 NOTE — Assessment & Plan Note (Signed)
His [employer] BiV, DDD PM is working normally. Will recheck in 3 months. Hopefully he will be back in NSR.

## 2014-07-05 NOTE — Assessment & Plan Note (Signed)
He is class 3A and this has worsened, I think because of atrial fib and possibly worsening MR. He is not a surgical candidate for MR repair because of advanced age, comorbidities and mild dementia. He will continue his diuretic and we will consider uptitration if NSR does not make him feel better.

## 2014-07-05 NOTE — Progress Notes (Signed)
HPI Mr. Leffel returns today for followup. He is a pleasant elderly man with chronic systolic CHF, non-ischemic CM, HTN and arthritis. He is s/p BiV PPM insertion. He returns today for followup. In the interim, he has had periods of weakness, and fatigue. He has not had more chest pressure but gets sob. He has dementia which is fairly mild.  Allergies  Allergen Reactions  . Codeine Diarrhea  . Penicillins     Other reaction(s): OTHER     Current Outpatient Prescriptions  Medication Sig Dispense Refill  . amitriptyline (ELAVIL) 10 MG tablet TAKE 1 TABLET BY MOUTH AT BEDTIME  30 tablet  2  . amLODipine (NORVASC) 2.5 MG tablet TAKE 1 TABLET (2.5 MG TOTAL) BY MOUTH DAILY.  90 tablet  0  . dorzolamide-timolol (COSOPT) 22.3-6.8 MG/ML ophthalmic solution       . fentaNYL (DURAGESIC) 25 MCG/HR patch Apply one patch every 3 days. NOTE dose reduction effective 05/20/2014  10 patch  0  . furosemide (LASIX) 20 MG tablet Take 1 tablet (20 mg total) by mouth daily as needed.  30 tablet  2  . ibuprofen (ADVIL,MOTRIN) 200 MG tablet Take 200 mg by mouth as needed.      . latanoprost (XALATAN) 0.005 % ophthalmic solution       . Meth-Hyo-M Bl-Na Phos-Ph Sal (URIBEL) 118 MG CAPS Take 1 capsule by mouth every 8 (eight) hours.      . potassium chloride SA (K-DUR,KLOR-CON) 20 MEQ tablet Take 1 tablet (20 mEq total) by mouth daily as needed.  30 tablet  0  . traMADol (ULTRAM) 50 MG tablet Take 50 mg by mouth.      . traZODone (DESYREL) 25 mg TABS tablet Take 0.5 tablets (25 mg total) by mouth at bedtime.  30 tablet  1  . amiodarone (PACERONE) 200 MG tablet Take 1 tablet (200 mg total) by mouth daily.  30 tablet  3  . rivaroxaban (XARELTO) 20 MG TABS tablet Take 1 tablet (20 mg total) by mouth daily with supper.  30 tablet  3   No current facility-administered medications for this visit.     Past Medical History  Diagnosis Date  . Chronic systolic heart failure   . Rash and other nonspecific skin eruption    . Costochondritis, acute   . Glaucoma   . Erectile dysfunction   . Visual changes   . Pancytopenia   . Chronic pancreatitis     Based on CT findings  . History of melanoma in situ     JAN 2012--  LEFT HEEL W/ SLN BX  . Chronic back pain   . IC (interstitial cystitis)   . BPH (benign prostatic hypertrophy)   . Cardiac pacemaker     CARDIOLOGIST-- DR Cristopher Peru ([facility]15-2013)  . Asymptomatic carotid artery stenosis     BILATERAL MILD ICA---  <50% PER DUPLEX 03-08-2008  . History of syncope   . Hypertension   . CHB (complete heart block)   . Nonischemic cardiomyopathy   . Severe mitral regurgitation   . Urge urinary incontinence     ROS:   All systems reviewed and negative except as noted in the HPI.   Past Surgical History  Procedure Laterality Date  . Cataract extraction, bilateral  left  1997/   right 2003  . Pacemaker insertion  12-06-2008  DR Xayvion Shirah    BiV PPM  --  [employer]  . Colonoscopy  08/18/2006    EYC:XKGY granularity and  erosions of the rectum of uncertain significance biopsied.  A long redundant, but otherwise normal-appearing colon.melanosi coli and minimal inflammation on bx  . Inguinal hernia repair Bilateral AS TEEN  . Interstim implant placement  2001  &  2007  . Cysto/ hod/  replacement interstim implant  05-14-2003  . Removal interstim implant/  cyst/  hod  04-14-2004  . Revision interstim implant and replace generator  05-15-2009    left upper buttock for urge urinary incontinence  . Wide excision left heel and sln bx  JUNE 2012  . Tonsillectomy  1950  . Left elbow surgery  2002  . Back surgery  X3  LAST ONE'90's  . Transthoracic echocardiogram  12-06-2008    LVSF 55-60%/  MODERATE MR/  MILD AR/  QUESTION DISTAL POSTERIOR HYPOKINESIS  . Interstim implant revision N/A 08/28/2013    Procedure: REPLACEMENT OF INTERSTIM-GENERATOR AND LEAD ;  Surgeon: [name], MD;  Location: [place];  Service:  Urology;  Laterality: N/A;  . Esophagogastroduodenoscopy N/A 03/28/2014    Procedure: ESOPHAGOGASTRODUODENOSCOPY (EGD);  Surgeon: [name], MD;  [facility];  Service: Endoscopy;  Laterality: N/A;  3:00-moved to [place] notfied pt  . Savory dilation N/A 03/28/2014    Procedure: SAVORY DILATION;  Surgeon: [name], MD;  [facility];  Service: Endoscopy;  Laterality: N/A;  Venia Minks dilation N/A 03/28/2014    Procedure: Venia Minks DILATION;  Surgeon: [name], MD;  [facility];  Service: Endoscopy;  Laterality: N/A;     Family History  Problem Relation Age of Onset  . Dementia Mother   . Throat cancer Father   . Cancer Father     throat  . Lung cancer Sister      History   Social History  . Marital Status: Married    Spouse Name: N/A    Number of Children: N/A  . Years of Education: N/A   Occupational History  . retired     Social History Main Topics  . Smoking status: Former Smoker    Quit date: 08/24/1997  . Smokeless tobacco: Not on file  . Alcohol Use: No  . Drug Use: No  . Sexual Activity: Not on file   Other Topics Concern  . Not on file   Social History Narrative  . No narrative on file     BP 130/84  Pulse 71  Ht 5\' 11"  (1.803 m)  Wt 163 lb (73.936 kg)  BMI 22.74 kg/m2  Physical Exam:  Well appearing elderly man, NAD HEENT: Unremarkable Neck:  7 cm JVD, no thyromegally Back:  No CVA tenderness Lungs:  Clear with no wheezes HEART:  Regular rate rhythm, grade 3/6 systolic murmur, no rubs, no clicks Abd:  soft, positive bowel sounds, no organomegally, no rebound, no guarding Ext:  2 plus pulses, no edema, no cyanosis, no clubbing Skin:  No rashes no nodules Neuro:  CN II through XII intact, motor grossly intact   DEVICE  Normal device function.  See PaceArt for details. BiV pacing 75%, now in persistent atrial fib.  Assess/Plan:

## 2014-07-08 ENCOUNTER — Other Ambulatory Visit: Payer: Self-pay

## 2014-07-08 DIAGNOSIS — G309 Alzheimer's disease, unspecified: Principal | ICD-10-CM

## 2014-07-08 DIAGNOSIS — F0280 Dementia in other diseases classified elsewhere without behavioral disturbance: Secondary | ICD-10-CM

## 2014-07-08 MED ORDER — DONEPEZIL HCL 5 MG PO TABS: 5.0000 mg | ORAL_TABLET | Freq: Every day | ORAL | Status: DC

## 2014-07-19 DIAGNOSIS — M25562 Pain in left knee: Secondary | ICD-10-CM | POA: Diagnosis not present

## 2014-07-19 DIAGNOSIS — M25511 Pain in right shoulder: Secondary | ICD-10-CM | POA: Diagnosis not present

## 2014-07-19 DIAGNOSIS — M25561 Pain in right knee: Secondary | ICD-10-CM | POA: Diagnosis not present

## 2014-07-19 DIAGNOSIS — M5441 Lumbago with sciatica, right side: Secondary | ICD-10-CM | POA: Diagnosis not present

## 2014-07-24 ENCOUNTER — Other Ambulatory Visit: Payer: Self-pay

## 2014-07-24 MED ORDER — POTASSIUM CHLORIDE CRYS ER 20 MEQ PO TBCR: 20.0000 meq | EXTENDED_RELEASE_TABLET | Freq: Every day | ORAL | Status: DC | PRN

## 2014-08-01 ENCOUNTER — Ambulatory Visit (INDEPENDENT_AMBULATORY_CARE_PROVIDER_SITE_OTHER): Payer: Medicare Other | Admitting: Family Medicine

## 2014-08-01 ENCOUNTER — Encounter: Payer: Self-pay | Admitting: Family Medicine

## 2014-08-01 ENCOUNTER — Ambulatory Visit (HOSPITAL_COMMUNITY)
Admission: RE | Admit: 2014-08-01 | Discharge: 2014-08-01 | Disposition: A | Discharge status: Home / Self Care | Payer: Medicare Other | Source: Ambulatory Visit | Attending: Family Medicine | Admitting: Family Medicine

## 2014-08-01 ENCOUNTER — Telehealth: Payer: Self-pay | Admitting: Family Medicine

## 2014-08-01 ENCOUNTER — Other Ambulatory Visit: Payer: Self-pay | Admitting: Family Medicine

## 2014-08-01 VITALS — BP 140/70 | HR 62 | Resp 18 | Ht 68.0 in | Wt 171.0 lb

## 2014-08-01 DIAGNOSIS — R296 Repeated falls: Secondary | ICD-10-CM

## 2014-08-01 DIAGNOSIS — G8929 Other chronic pain: Secondary | ICD-10-CM | POA: Diagnosis present

## 2014-08-01 DIAGNOSIS — I4819 Other persistent atrial fibrillation: Secondary | ICD-10-CM

## 2014-08-01 DIAGNOSIS — K5900 Constipation, unspecified: Secondary | ICD-10-CM | POA: Diagnosis present

## 2014-08-01 DIAGNOSIS — M6281 Muscle weakness (generalized): Secondary | ICD-10-CM | POA: Diagnosis not present

## 2014-08-01 DIAGNOSIS — D61818 Other pancytopenia: Secondary | ICD-10-CM

## 2014-08-01 DIAGNOSIS — R262 Difficulty in walking, not elsewhere classified: Secondary | ICD-10-CM | POA: Diagnosis not present

## 2014-08-01 DIAGNOSIS — Z9119 Patient's noncompliance with other medical treatment and regimen: Secondary | ICD-10-CM | POA: Diagnosis present

## 2014-08-01 DIAGNOSIS — N179 Acute kidney failure, unspecified: Secondary | ICD-10-CM | POA: Diagnosis present

## 2014-08-01 DIAGNOSIS — Z79899 Other long term (current) drug therapy: Secondary | ICD-10-CM | POA: Diagnosis not present

## 2014-08-01 DIAGNOSIS — G309 Alzheimer's disease, unspecified: Secondary | ICD-10-CM

## 2014-08-01 DIAGNOSIS — R531 Weakness: Secondary | ICD-10-CM | POA: Diagnosis not present

## 2014-08-01 DIAGNOSIS — F0280 Dementia in other diseases classified elsewhere without behavioral disturbance: Secondary | ICD-10-CM

## 2014-08-01 DIAGNOSIS — D649 Anemia, unspecified: Secondary | ICD-10-CM | POA: Diagnosis not present

## 2014-08-01 DIAGNOSIS — I5022 Chronic systolic (congestive) heart failure: Secondary | ICD-10-CM | POA: Diagnosis not present

## 2014-08-01 DIAGNOSIS — I481 Persistent atrial fibrillation: Secondary | ICD-10-CM

## 2014-08-01 DIAGNOSIS — I5023 Acute on chronic systolic (congestive) heart failure: Secondary | ICD-10-CM | POA: Diagnosis present

## 2014-08-01 DIAGNOSIS — Z85820 Personal history of malignant melanoma of skin: Secondary | ICD-10-CM | POA: Diagnosis not present

## 2014-08-01 DIAGNOSIS — R17 Unspecified jaundice: Secondary | ICD-10-CM | POA: Diagnosis not present

## 2014-08-01 DIAGNOSIS — S0990XA Unspecified injury of head, initial encounter: Secondary | ICD-10-CM | POA: Diagnosis not present

## 2014-08-01 DIAGNOSIS — R488 Other symbolic dysfunctions: Secondary | ICD-10-CM | POA: Diagnosis not present

## 2014-08-01 DIAGNOSIS — R401 Stupor: Secondary | ICD-10-CM | POA: Diagnosis not present

## 2014-08-01 DIAGNOSIS — I10 Essential (primary) hypertension: Secondary | ICD-10-CM

## 2014-08-01 DIAGNOSIS — J811 Chronic pulmonary edema: Secondary | ICD-10-CM | POA: Diagnosis not present

## 2014-08-01 DIAGNOSIS — R55 Syncope and collapse: Secondary | ICD-10-CM | POA: Diagnosis not present

## 2014-08-01 DIAGNOSIS — M25552 Pain in left hip: Secondary | ICD-10-CM

## 2014-08-01 DIAGNOSIS — R9389 Abnormal findings on diagnostic imaging of other specified body structures: Secondary | ICD-10-CM

## 2014-08-01 DIAGNOSIS — R195 Other fecal abnormalities: Secondary | ICD-10-CM | POA: Diagnosis not present

## 2014-08-01 DIAGNOSIS — D6489 Other specified anemias: Secondary | ICD-10-CM | POA: Diagnosis not present

## 2014-08-01 DIAGNOSIS — K922 Gastrointestinal hemorrhage, unspecified: Secondary | ICD-10-CM | POA: Diagnosis present

## 2014-08-01 DIAGNOSIS — I517 Cardiomegaly: Secondary | ICD-10-CM | POA: Diagnosis not present

## 2014-08-01 DIAGNOSIS — Z9181 History of falling: Secondary | ICD-10-CM | POA: Diagnosis not present

## 2014-08-01 DIAGNOSIS — N400 Enlarged prostate without lower urinary tract symptoms: Secondary | ICD-10-CM | POA: Diagnosis present

## 2014-08-01 DIAGNOSIS — M79642 Pain in left hand: Secondary | ICD-10-CM | POA: Diagnosis not present

## 2014-08-01 DIAGNOSIS — Z7901 Long term (current) use of anticoagulants: Secondary | ICD-10-CM | POA: Diagnosis not present

## 2014-08-01 DIAGNOSIS — R938 Abnormal findings on diagnostic imaging of other specified body structures: Secondary | ICD-10-CM

## 2014-08-01 DIAGNOSIS — D638 Anemia in other chronic diseases classified elsewhere: Secondary | ICD-10-CM | POA: Diagnosis not present

## 2014-08-01 DIAGNOSIS — R4182 Altered mental status, unspecified: Secondary | ICD-10-CM | POA: Diagnosis not present

## 2014-08-01 DIAGNOSIS — R278 Other lack of coordination: Secondary | ICD-10-CM | POA: Diagnosis not present

## 2014-08-01 DIAGNOSIS — Z801 Family history of malignant neoplasm of trachea, bronchus and lung: Secondary | ICD-10-CM | POA: Diagnosis not present

## 2014-08-01 DIAGNOSIS — Z808 Family history of malignant neoplasm of other organs or systems: Secondary | ICD-10-CM | POA: Diagnosis not present

## 2014-08-01 DIAGNOSIS — Z791 Long term (current) use of non-steroidal anti-inflammatories (NSAID): Secondary | ICD-10-CM | POA: Diagnosis not present

## 2014-08-01 DIAGNOSIS — Z87891 Personal history of nicotine dependence: Secondary | ICD-10-CM | POA: Diagnosis not present

## 2014-08-01 DIAGNOSIS — S60512A Abrasion of left hand, initial encounter: Secondary | ICD-10-CM

## 2014-08-01 DIAGNOSIS — E559 Vitamin D deficiency, unspecified: Secondary | ICD-10-CM | POA: Diagnosis not present

## 2014-08-01 DIAGNOSIS — M19042 Primary osteoarthritis, left hand: Secondary | ICD-10-CM | POA: Diagnosis not present

## 2014-08-01 DIAGNOSIS — Z960 Presence of urogenital implants: Secondary | ICD-10-CM | POA: Diagnosis not present

## 2014-08-01 DIAGNOSIS — N3010 Interstitial cystitis (chronic) without hematuria: Secondary | ICD-10-CM | POA: Diagnosis present

## 2014-08-01 DIAGNOSIS — Z79891 Long term (current) use of opiate analgesic: Secondary | ICD-10-CM | POA: Diagnosis not present

## 2014-08-01 DIAGNOSIS — Z950 Presence of cardiac pacemaker: Secondary | ICD-10-CM | POA: Diagnosis not present

## 2014-08-01 DIAGNOSIS — I429 Cardiomyopathy, unspecified: Secondary | ICD-10-CM | POA: Diagnosis present

## 2014-08-01 DIAGNOSIS — I4891 Unspecified atrial fibrillation: Secondary | ICD-10-CM | POA: Diagnosis not present

## 2014-08-01 LAB — BASIC METABOLIC PANEL
BUN: 28 mg/dL — AB (ref 6–23)
CALCIUM: 8.5 mg/dL (ref 8.4–10.5)
CO2: 27 mEq/L (ref 19–32)
Chloride: 107 mEq/L (ref 96–112)
Creat: 1.31 mg/dL (ref 0.50–1.35)
GLUCOSE: 87 mg/dL (ref 70–99)
POTASSIUM: 4 meq/L (ref 3.5–5.3)
Sodium: 144 mEq/L (ref 135–145)

## 2014-08-01 LAB — CBC
HEMATOCRIT: 24.6 % — AB (ref 39.0–52.0)
Hemoglobin: 8.3 g/dL — ABNORMAL LOW (ref 13.0–17.0)
MCH: 33.3 pg (ref 26.0–34.0)
MCHC: 33.7 g/dL (ref 30.0–36.0)
MCV: 98.8 fL (ref 78.0–100.0)
Platelets: 124 10*3/uL — ABNORMAL LOW (ref 150–400)
RBC: 2.49 MIL/uL — ABNORMAL LOW (ref 4.22–5.81)
RDW: 16.4 % — ABNORMAL HIGH (ref 11.5–15.5)
WBC: 2.7 10*3/uL — ABNORMAL LOW (ref 4.0–10.5)

## 2014-08-01 NOTE — Telephone Encounter (Signed)
I discussed with wife directly pts need for transfusion.  She is to bring him to office tomorrow for stool test for FOB , pls do this and let me know if positive, he will need admission, will need to be directed to ED, if not then will proceed with out pt transfusion asked her to bring him in at 8:15  I also advised her to give him nothing for pain other than tramadol and fentanyl, he was still taking ibuprofen OTC despite being on xaralto  Pls add iron and ferritin level to blood drawn today  I am sending cardiology a message re the situation., not sure that cardiology will elect to continuee the xaralto, will also arrange for visit with cardiology next week if they think necessary  I have also advised her about Dr Luna Glasgow referral for hand. PLs let her know if the "scab on the hand does not resove in 1 week call back for derm eval, he has been treated for melanoma in the past and it presented in similar fashion  ??? pls ask!!

## 2014-08-01 NOTE — Assessment & Plan Note (Signed)
H/o hand trauma with slow healing and persistent scab, antibiotic ointment given for twice daily use fo next 5 to 7 days, if persists will need dermatology eval, had multiple myeloma on his foot which presented in a similar fashion

## 2014-08-01 NOTE — Assessment & Plan Note (Signed)
Left hand pain and bruising s/p recurrent falls, abnormal xray of hand, needs further eval by ortho, may have ligamentous injury

## 2014-08-01 NOTE — Assessment & Plan Note (Signed)
Left hip pain s/p fall, xray obsained negative for fracture, pt does have extensive bruise from left hip down posterior thigh to knee

## 2014-08-01 NOTE — Assessment & Plan Note (Signed)
rept MMSE needs, and hisdose of aricept needs to be increased at next visit

## 2014-08-01 NOTE — Assessment & Plan Note (Signed)
Pt to return in am for testing for heme positive stool as e is on xarelto, need to ensure bleed is not internal as well as the obvious bruise from recurrent trauma If heme neg, will transfuse as out pt ,  I called and explained to his spouse to bring him to office for further management, and arrangements have been started for outpatient  transfusion

## 2014-08-01 NOTE — Assessment & Plan Note (Signed)
Controlled, no change in medication  

## 2014-08-01 NOTE — Assessment & Plan Note (Signed)
Recently sarted on xarelto, however pt has pancytopenia and recurrent falls. Hb checked today when he presents with large bruise , he will need transfusion. i will contact cardiology in am to make Doc aware of current situation

## 2014-08-01 NOTE — Assessment & Plan Note (Signed)
Needs evaluation for PWC, also more involvement by patient's son/daughter , pt states house is cluttered , will follow through on both

## 2014-08-01 NOTE — Patient Instructions (Addendum)
F/u mid December, call if you need me before  You have a large bruise on your leg from your fall, "green area" described is from old bruising  You need CBC stat today, and vit D level and chem 7 not stat  You are referred for x rays of hip and hand  You will get antibiotic ointment from office to use twice daily on your hand for next 5 days..  We would like to speak with one of your children so that you can get the help you need as far as home safety is concerned, de cluttering and use of a safe assistive device I am referring you for evaluation for power wheelchair  Fall Prevention and Home Safety Falls cause injuries and can affect all age groups. It is possible to prevent falls.  HOW TO PREVENT FALLS  Wear shoes with rubber soles that do not have an opening for your toes.  Keep the inside and outside of your house well lit.  Use night lights throughout your home.  Remove clutter from floors.  Clean up floor spills.  Remove throw rugs or fasten them to the floor with carpet tape.  Do not place electrical cords across pathways.  Put grab bars by your tub, shower, and toilet. Do not use towel bars as grab bars.  Put handrails on both sides of the stairway. Fix loose handrails.  Do not climb on stools or stepladders, if possible.  Do not wax your floors.  Repair uneven or unsafe sidewalks, walkways, or stairs.  Keep items you use a lot within reach.  Be aware of pets.  Keep emergency numbers next to the telephone.  Put smoke detectors in your home and near bedrooms. Ask your doctor what other things you can do to prevent falls. Document Released: 07/17/2009 Document Revised: 03/21/2012 Document Reviewed: 12/21/2011 [facility] Patient Information 2015 [place], [place]. This information is not intended to replace advice given to you by your health care provider. Make sure you discuss any questions you have with your health care provider.

## 2014-08-01 NOTE — Progress Notes (Signed)
   Subjective:    Patient ID: [name], male    DOB: 1926-06-04, 80 y.o.   MRN: [id number]  HPI Pt in due to recurrent falls, large bruising on left hip down left thigh to knee. Recently started on xaralto due to PAF, and increased stroke risk.Wife was concerned as an area "looked green" wondered if medication was "poisoning " him, I explained the discoloration was due to aging blood in area of bruise. C/o non healing sore on left hand where he traumatized it some back also increased left hip and left hand pain   Review of Systems See HPI Denies recent fever or chills. Denies sinus pressure, nasal congestion, ear pain or sore throat. Denies chest congestion, productive cough or wheezing. Denies chest pains, palpitations and leg swelling Denies abdominal pain, nausea, vomiting,diarrhea or constipation.   Denies headaches, seizures, numbness, or tingling.       Objective:   Physical Exam  BP 140/70  Pulse 62  Resp 18  Ht $R'5\' 8"'sq$  (1.727 m)  Wt 171 lb (77.565 kg)  BMI 26.01 kg/m2  SpO2 95% Patient alert and and in no cardiopulmonary distress.  HEENT: No facial asymmetry, EOMI,     Neck decreased ROM no JVD, no mass.Mucosa pale  Chest: Clear to auscultation bilaterally.  CVS: S1, S2 systolic murmur, no S3.  ABD: Soft non tender.   Ext: No edema  MS: decreased  ROM spine, shoulders, hips and knees.  Skin: extensive bruise on left hip down to left posterior knee, no open laceration, scabbed area on left hand where he has had trauma in past delayed healing present  Psych: Good eye contact, normal affect. Memory loss not anxious or depressed appearing.  CNS: CN 2-12 intact, decreased hand grip , grade 3 to  4, throughout.no focal deficits noted.       Assessment & Plan:  Hand pain, left Left hand pain and bruising s/p recurrent falls, abnormal xray of hand, needs further eval by ortho, may have ligamentous injury  Recurrent falls Needs evaluation for PWC, also more  involvement by patient's son/daughter , pt states house is cluttered , will follow through on both  Left hip pain Left hip pain s/p fall, xray obsained negative for fracture, pt does have extensive bruise from left hip down posterior thigh to knee  Atrial fibrillation, persistent Recently sarted on xarelto, however pt has pancytopenia and recurrent falls. Hb checked today when he presents with large bruise , he will need transfusion. i will contact cardiology in am to make Doc aware of current situation   Anemia due to other cause Pt to return in am for testing for heme positive stool as e is on xarelto, need to ensure bleed is not internal as well as the obvious bruise from recurrent trauma If heme neg, will transfuse as out pt ,  I called and explained to his spouse to bring him to office for further management, and arrangements have been started for outpatient  transfusion  Abrasion of hand, left H/o hand trauma with slow healing and persistent scab, antibiotic ointment given for twice daily use fo next 5 to 7 days, if persists will need dermatology eval, had multiple myeloma on his foot which presented in a similar fashion  Alzheimer's dementia rept MMSE needs, and hisdose of aricept needs to be increased at next visit  Essential hypertension Controlled, no change in medication

## 2014-08-02 ENCOUNTER — Emergency Department (HOSPITAL_COMMUNITY): Payer: Medicare Other

## 2014-08-02 ENCOUNTER — Inpatient Hospital Stay (HOSPITAL_COMMUNITY)
Admission: EM | Admit: 2014-08-02 | Discharge: 2014-08-06 | DRG: 377 | Disposition: A | Discharge status: Skilled Nursing Facility | Payer: Medicare Other | Attending: Family Medicine | Admitting: Family Medicine

## 2014-08-02 ENCOUNTER — Encounter (HOSPITAL_COMMUNITY): Payer: Self-pay | Admitting: Emergency Medicine

## 2014-08-02 ENCOUNTER — Ambulatory Visit: Payer: Self-pay | Admitting: Adult Health

## 2014-08-02 DIAGNOSIS — I5022 Chronic systolic (congestive) heart failure: Secondary | ICD-10-CM | POA: Diagnosis present

## 2014-08-02 DIAGNOSIS — Z79899 Other long term (current) drug therapy: Secondary | ICD-10-CM | POA: Diagnosis not present

## 2014-08-02 DIAGNOSIS — Z85820 Personal history of malignant melanoma of skin: Secondary | ICD-10-CM | POA: Diagnosis not present

## 2014-08-02 DIAGNOSIS — I481 Persistent atrial fibrillation: Secondary | ICD-10-CM | POA: Diagnosis not present

## 2014-08-02 DIAGNOSIS — I5023 Acute on chronic systolic (congestive) heart failure: Secondary | ICD-10-CM | POA: Diagnosis present

## 2014-08-02 DIAGNOSIS — R531 Weakness: Secondary | ICD-10-CM | POA: Diagnosis not present

## 2014-08-02 DIAGNOSIS — R29898 Other symptoms and signs involving the musculoskeletal system: Secondary | ICD-10-CM | POA: Diagnosis present

## 2014-08-02 DIAGNOSIS — C439 Malignant melanoma of skin, unspecified: Secondary | ICD-10-CM | POA: Diagnosis present

## 2014-08-02 DIAGNOSIS — K922 Gastrointestinal hemorrhage, unspecified: Secondary | ICD-10-CM | POA: Diagnosis not present

## 2014-08-02 DIAGNOSIS — N400 Enlarged prostate without lower urinary tract symptoms: Secondary | ICD-10-CM | POA: Diagnosis present

## 2014-08-02 DIAGNOSIS — Z7901 Long term (current) use of anticoagulants: Secondary | ICD-10-CM

## 2014-08-02 DIAGNOSIS — G309 Alzheimer's disease, unspecified: Secondary | ICD-10-CM | POA: Diagnosis present

## 2014-08-02 DIAGNOSIS — Z960 Presence of urogenital implants: Secondary | ICD-10-CM | POA: Diagnosis not present

## 2014-08-02 DIAGNOSIS — I10 Essential (primary) hypertension: Secondary | ICD-10-CM | POA: Diagnosis present

## 2014-08-02 DIAGNOSIS — M6281 Muscle weakness (generalized): Secondary | ICD-10-CM | POA: Diagnosis not present

## 2014-08-02 DIAGNOSIS — M79642 Pain in left hand: Secondary | ICD-10-CM | POA: Diagnosis present

## 2014-08-02 DIAGNOSIS — D638 Anemia in other chronic diseases classified elsewhere: Secondary | ICD-10-CM

## 2014-08-02 DIAGNOSIS — Z801 Family history of malignant neoplasm of trachea, bronchus and lung: Secondary | ICD-10-CM | POA: Diagnosis not present

## 2014-08-02 DIAGNOSIS — D61818 Other pancytopenia: Secondary | ICD-10-CM | POA: Diagnosis present

## 2014-08-02 DIAGNOSIS — R278 Other lack of coordination: Secondary | ICD-10-CM | POA: Diagnosis not present

## 2014-08-02 DIAGNOSIS — Z808 Family history of malignant neoplasm of other organs or systems: Secondary | ICD-10-CM

## 2014-08-02 DIAGNOSIS — I4891 Unspecified atrial fibrillation: Secondary | ICD-10-CM | POA: Diagnosis not present

## 2014-08-02 DIAGNOSIS — I517 Cardiomegaly: Secondary | ICD-10-CM | POA: Diagnosis not present

## 2014-08-02 DIAGNOSIS — R296 Repeated falls: Secondary | ICD-10-CM | POA: Diagnosis present

## 2014-08-02 DIAGNOSIS — N179 Acute kidney failure, unspecified: Secondary | ICD-10-CM | POA: Diagnosis present

## 2014-08-02 DIAGNOSIS — Z9181 History of falling: Secondary | ICD-10-CM | POA: Diagnosis not present

## 2014-08-02 DIAGNOSIS — Z950 Presence of cardiac pacemaker: Secondary | ICD-10-CM

## 2014-08-02 DIAGNOSIS — W19XXXA Unspecified fall, initial encounter: Secondary | ICD-10-CM

## 2014-08-02 DIAGNOSIS — R262 Difficulty in walking, not elsewhere classified: Secondary | ICD-10-CM | POA: Diagnosis not present

## 2014-08-02 DIAGNOSIS — R17 Unspecified jaundice: Secondary | ICD-10-CM | POA: Diagnosis not present

## 2014-08-02 DIAGNOSIS — N3010 Interstitial cystitis (chronic) without hematuria: Secondary | ICD-10-CM | POA: Diagnosis present

## 2014-08-02 DIAGNOSIS — D6489 Other specified anemias: Secondary | ICD-10-CM

## 2014-08-02 DIAGNOSIS — Z791 Long term (current) use of non-steroidal anti-inflammatories (NSAID): Secondary | ICD-10-CM

## 2014-08-02 DIAGNOSIS — R195 Other fecal abnormalities: Secondary | ICD-10-CM | POA: Diagnosis not present

## 2014-08-02 DIAGNOSIS — R401 Stupor: Secondary | ICD-10-CM | POA: Diagnosis not present

## 2014-08-02 DIAGNOSIS — Z9119 Patient's noncompliance with other medical treatment and regimen: Secondary | ICD-10-CM | POA: Diagnosis present

## 2014-08-02 DIAGNOSIS — G8929 Other chronic pain: Secondary | ICD-10-CM | POA: Diagnosis present

## 2014-08-02 DIAGNOSIS — F0280 Dementia in other diseases classified elsewhere without behavioral disturbance: Secondary | ICD-10-CM | POA: Diagnosis present

## 2014-08-02 DIAGNOSIS — D649 Anemia, unspecified: Secondary | ICD-10-CM

## 2014-08-02 DIAGNOSIS — Z79891 Long term (current) use of opiate analgesic: Secondary | ICD-10-CM | POA: Diagnosis not present

## 2014-08-02 DIAGNOSIS — Z87891 Personal history of nicotine dependence: Secondary | ICD-10-CM

## 2014-08-02 DIAGNOSIS — R488 Other symbolic dysfunctions: Secondary | ICD-10-CM | POA: Diagnosis not present

## 2014-08-02 DIAGNOSIS — I429 Cardiomyopathy, unspecified: Secondary | ICD-10-CM | POA: Diagnosis present

## 2014-08-02 DIAGNOSIS — K5900 Constipation, unspecified: Secondary | ICD-10-CM | POA: Diagnosis present

## 2014-08-02 DIAGNOSIS — J811 Chronic pulmonary edema: Secondary | ICD-10-CM | POA: Diagnosis not present

## 2014-08-02 DIAGNOSIS — R4182 Altered mental status, unspecified: Secondary | ICD-10-CM | POA: Diagnosis not present

## 2014-08-02 DIAGNOSIS — S0990XA Unspecified injury of head, initial encounter: Secondary | ICD-10-CM | POA: Diagnosis not present

## 2014-08-02 DIAGNOSIS — W1800XA Striking against unspecified object with subsequent fall, initial encounter: Secondary | ICD-10-CM

## 2014-08-02 DIAGNOSIS — R55 Syncope and collapse: Secondary | ICD-10-CM | POA: Diagnosis not present

## 2014-08-02 DIAGNOSIS — R079 Chest pain, unspecified: Secondary | ICD-10-CM | POA: Diagnosis present

## 2014-08-02 HISTORY — DX: Unspecified dementia, unspecified severity, without behavioral disturbance, psychotic disturbance, mood disturbance, and anxiety: F03.90

## 2014-08-02 HISTORY — DX: Unspecified atrial fibrillation: I48.91

## 2014-08-02 LAB — COMPREHENSIVE METABOLIC PANEL
ALK PHOS: 53 U/L (ref 39–117)
ALT: 13 U/L (ref 0–53)
ANION GAP: 8 (ref 5–15)
AST: 20 U/L (ref 0–37)
Albumin: 3 g/dL — ABNORMAL LOW (ref 3.5–5.2)
BILIRUBIN TOTAL: 1.4 mg/dL — AB (ref 0.3–1.2)
BUN: 29 mg/dL — ABNORMAL HIGH (ref 6–23)
CHLORIDE: 107 meq/L (ref 96–112)
CO2: 28 mEq/L (ref 19–32)
Calcium: 8.4 mg/dL (ref 8.4–10.5)
Creatinine, Ser: 1.4 mg/dL — ABNORMAL HIGH (ref 0.50–1.35)
GFR calc non Af Amer: 44 mL/min — ABNORMAL LOW (ref >90)
GFR, EST AFRICAN AMERICAN: 51 mL/min — AB (ref >90)
GLUCOSE: 115 mg/dL — AB (ref 70–99)
POTASSIUM: 3.9 meq/L (ref 3.7–5.3)
SODIUM: 143 meq/L (ref 137–147)
Total Protein: 5.8 g/dL — ABNORMAL LOW (ref 6.0–8.3)

## 2014-08-02 LAB — HEMOCCULT GUIAC POC 1CARD (OFFICE): Fecal Occult Blood, POC: POSITIVE

## 2014-08-02 LAB — CBC WITH DIFFERENTIAL/PLATELET
Basophils Absolute: 0 10*3/uL (ref 0.0–0.1)
Basophils Relative: 0 % (ref 0–1)
Eosinophils Absolute: 0 10*3/uL (ref 0.0–0.7)
Eosinophils Relative: 2 % (ref 0–5)
HCT: 23 % — ABNORMAL LOW (ref 39.0–52.0)
Hemoglobin: 7.6 g/dL — ABNORMAL LOW (ref 13.0–17.0)
LYMPHS ABS: 0.3 10*3/uL — AB (ref 0.7–4.0)
Lymphocytes Relative: 15 % (ref 12–46)
MCH: 32.6 pg (ref 26.0–34.0)
MCHC: 33 g/dL (ref 30.0–36.0)
MCV: 98.7 fL (ref 78.0–100.0)
MONOS PCT: 10 % (ref 3–12)
Monocytes Absolute: 0.2 10*3/uL (ref 0.1–1.0)
NEUTROS ABS: 1.4 10*3/uL — AB (ref 1.7–7.7)
NEUTROS PCT: 74 % (ref 43–77)
PLATELETS: 103 10*3/uL — AB (ref 150–400)
RBC: 2.33 MIL/uL — AB (ref 4.22–5.81)
RDW: 16.7 % — ABNORMAL HIGH (ref 11.5–15.5)
WBC: 1.9 10*3/uL — ABNORMAL LOW (ref 4.0–10.5)

## 2014-08-02 LAB — VITAMIN D 25 HYDROXY (VIT D DEFICIENCY, FRACTURES): Vit D, 25-Hydroxy: 29 ng/mL — ABNORMAL LOW (ref 30–89)

## 2014-08-02 LAB — LIPASE, BLOOD: Lipase: 26 U/L (ref 11–59)

## 2014-08-02 LAB — IRON: IRON: 57 ug/dL (ref 42–165)

## 2014-08-02 LAB — FERRITIN: FERRITIN: 119 ng/mL (ref 22–322)

## 2014-08-02 LAB — PREPARE RBC (CROSSMATCH)

## 2014-08-02 LAB — POC OCCULT BLOOD, ED: Fecal Occult Bld: POSITIVE — AB

## 2014-08-02 MED ORDER — FUROSEMIDE 10 MG/ML IJ SOLN
20.0000 mg | Freq: Once | INTRAMUSCULAR | Status: AC
  Administered 2014-08-02: 20 mg via INTRAVENOUS
  Filled 2014-08-02: qty 2

## 2014-08-02 MED ORDER — DORZOLAMIDE HCL-TIMOLOL MAL 2-0.5 % OP SOLN
1.0000 [drp] | Freq: Two times a day (BID) | OPHTHALMIC | Status: DC
  Administered 2014-08-02 – 2014-08-06 (×8): 1 [drp] via OPHTHALMIC
  Filled 2014-08-02: qty 10

## 2014-08-02 MED ORDER — PANTOPRAZOLE SODIUM 40 MG IV SOLR
40.0000 mg | Freq: Two times a day (BID) | INTRAVENOUS | Status: DC
  Administered 2014-08-02 – 2014-08-03 (×3): 40 mg via INTRAVENOUS
  Filled 2014-08-02 (×3): qty 40

## 2014-08-02 MED ORDER — AMLODIPINE BESYLATE 5 MG PO TABS
2.5000 mg | ORAL_TABLET | Freq: Every day | ORAL | Status: DC
  Administered 2014-08-03: 2.5 mg via ORAL
  Filled 2014-08-02 (×2): qty 1

## 2014-08-02 MED ORDER — FENTANYL 25 MCG/HR TD PT72
25.0000 ug | MEDICATED_PATCH | TRANSDERMAL | Status: DC
  Administered 2014-08-03: 25 ug via TRANSDERMAL
  Filled 2014-08-02 (×2): qty 1

## 2014-08-02 MED ORDER — AMIODARONE HCL 200 MG PO TABS
200.0000 mg | ORAL_TABLET | Freq: Every day | ORAL | Status: DC
  Administered 2014-08-02 – 2014-08-06 (×5): 200 mg via ORAL
  Filled 2014-08-02 (×5): qty 1

## 2014-08-02 MED ORDER — SODIUM CHLORIDE 0.9 % IV SOLN
Freq: Once | INTRAVENOUS | Status: AC
Start: 2014-08-02 — End: 2014-08-02
  Administered 2014-08-02: 15:00:00 via INTRAVENOUS

## 2014-08-02 MED ORDER — AMITRIPTYLINE HCL 10 MG PO TABS
10.0000 mg | ORAL_TABLET | Freq: Every day | ORAL | Status: DC
  Administered 2014-08-02 – 2014-08-05 (×4): 10 mg via ORAL
  Filled 2014-08-02 (×4): qty 1

## 2014-08-02 MED ORDER — TRAMADOL HCL 50 MG PO TABS: 50.0000 mg | ORAL_TABLET | ORAL | Status: DC | PRN

## 2014-08-02 MED ORDER — TAMSULOSIN HCL 0.4 MG PO CAPS
0.4000 mg | ORAL_CAPSULE | Freq: Every day | ORAL | Status: DC
  Administered 2014-08-03 – 2014-08-06 (×4): 0.4 mg via ORAL
  Filled 2014-08-02 (×4): qty 1

## 2014-08-02 MED ORDER — SODIUM CHLORIDE 0.9 % IV SOLN
INTRAVENOUS | Status: DC
Start: 2014-08-02 — End: 2014-08-02
  Administered 2014-08-02: 12:00:00 via INTRAVENOUS

## 2014-08-02 MED ORDER — DONEPEZIL HCL 5 MG PO TABS
5.0000 mg | ORAL_TABLET | Freq: Every day | ORAL | Status: DC
  Administered 2014-08-02 – 2014-08-05 (×4): 5 mg via ORAL
  Filled 2014-08-02 (×4): qty 1

## 2014-08-02 MED ORDER — TRAZODONE HCL 50 MG PO TABS
25.0000 mg | ORAL_TABLET | Freq: Every day | ORAL | Status: DC
  Administered 2014-08-02 – 2014-08-05 (×4): 25 mg via ORAL
  Filled 2014-08-02 (×4): qty 1

## 2014-08-02 MED ORDER — LATANOPROST 0.005 % OP SOLN
1.0000 [drp] | Freq: Every day | OPHTHALMIC | Status: DC
  Administered 2014-08-02 – 2014-08-05 (×4): 1 [drp] via OPHTHALMIC
  Filled 2014-08-02: qty 2.5

## 2014-08-02 MED ORDER — SODIUM CHLORIDE 0.9 % IJ SOLN
3.0000 mL | Freq: Two times a day (BID) | INTRAMUSCULAR | Status: DC
  Administered 2014-08-02 – 2014-08-05 (×5): 3 mL via INTRAVENOUS

## 2014-08-02 NOTE — ED Provider Notes (Signed)
CSN: [id number]     Arrival date & time 08/02/14  8144 History   First MD Initiated Contact with Patient 08/02/14 0911     Chief Complaint  Patient presents with  . Rectal Bleeding     (Consider location/radiation/quality/duration/timing/severity/associated sxs/prior Treatment) HPI Comments: Pt comes in today after being sent by his pcp. Dr. Moshe Cipro. Wife states that he is here because he has rectal bleeding. Pt states that he is having back pain. He was put on xarelto in the last month and noted dark stool as that time. Pt has had dizziness and generalized weakness. Wife states that has been falling also. Denies cp. Has some sob but unsure of worse then normal.   The history is provided by the patient. No language interpreter was used.    Past Medical History  Diagnosis Date  . Chronic systolic heart failure   . Rash and other nonspecific skin eruption   . Costochondritis, acute   . Glaucoma   . Erectile dysfunction   . Visual changes   . Pancytopenia   . Chronic pancreatitis     Based on CT findings  . History of melanoma in situ     JAN 2012--  LEFT HEEL W/ SLN BX  . Chronic back pain   . IC (interstitial cystitis)   . BPH (benign prostatic hypertrophy)   . Cardiac pacemaker     CARDIOLOGIST-- DR Cristopher Peru (LAST PACE [facility] 05-15-2013)  . Asymptomatic carotid artery stenosis     BILATERAL MILD ICA---  <50% PER DUPLEX 03-08-2008  . History of syncope   . Hypertension   . CHB (complete heart block)   . Nonischemic cardiomyopathy   . Severe mitral regurgitation   . Urge urinary incontinence    Past Surgical History  Procedure Laterality Date  . Cataract extraction, bilateral  left  1997/   right 2003  . Pacemaker insertion  12-06-2008  DR GREGG TAYLOR    BiV PPM  --  [employer]  . Colonoscopy  08/18/2006    YJE:HUDJ granularity and erosions of the rectum of uncertain significance biopsied.  A long redundant, but otherwise normal-appearing colon.melanosi coli and  minimal inflammation on bx  . Inguinal hernia repair Bilateral AS TEEN  . Interstim implant placement  2001  &  2007  . Cysto/ hod/  replacement interstim implant  05-14-2003  . Removal interstim implant/  cyst/  hod  04-14-2004  . Revision interstim implant and replace generator  05-15-2009    left upper buttock for urge urinary incontinence  . Wide excision left heel and sln bx  JUNE 2012  . Tonsillectomy  1950  . Left elbow surgery  2002  . Back surgery  X3  LAST ONE'90's  . Transthoracic echocardiogram  12-06-2008    LVSF 55-60%/  MODERATE MR/  MILD AR/  QUESTION DISTAL POSTERIOR HYPOKINESIS  . Interstim implant revision N/A 08/28/2013    Procedure: REPLACEMENT OF INTERSTIM-GENERATOR AND LEAD ;  Surgeon: [name], MD;  Location: [place];  Service: Urology;  Laterality: N/A;  . Esophagogastroduodenoscopy N/A 03/28/2014    Procedure: ESOPHAGOGASTRODUODENOSCOPY (EGD);  Surgeon: [name], MD;  [facility];  Service: Endoscopy;  Laterality: N/A;  3:00-moved to [place] notfied pt  . Savory dilation N/A 03/28/2014    Procedure: SAVORY DILATION;  Surgeon: [name], MD;  Location: [facility];  Service: Endoscopy;  Laterality: N/A;  Venia Minks dilation N/A 03/28/2014    Procedure:  MALONEY DILATION;  Surgeon: [name], MD;  [facility];  Service: Endoscopy;  Laterality: N/A;   Family History  Problem Relation Age of Onset  . Dementia Mother   . Throat cancer Father   . Cancer Father     throat  . Lung cancer Sister    History  Substance Use Topics  . Smoking status: Former Smoker    Quit date: 08/24/1997  . Smokeless tobacco: Not on file  . Alcohol Use: No    Review of Systems  All other systems reviewed and are negative.     Allergies  Codeine and Penicillins  Home Medications   Prior to Admission medications   Medication Sig Start Date End Date Taking? Authorizing Provider  amiodarone (PACERONE)  200 MG tablet Take 1 tablet (200 mg total) by mouth daily. 07/05/14   [name], MD  amitriptyline (ELAVIL) 10 MG tablet TAKE 1 TABLET BY MOUTH AT BEDTIME 07/05/14   Fayrene Helper, MD  amLODipine (NORVASC) 2.5 MG tablet TAKE 1 TABLET (2.5 MG TOTAL) BY MOUTH DAILY. 07/05/14   Fayrene Helper, MD  donepezil (ARICEPT) 5 MG tablet Take 1 tablet (5 mg total) by mouth at bedtime. 07/08/14 07/08/15  Fayrene Helper, MD  dorzolamide-timolol (COSOPT) 22.3-6.8 MG/ML ophthalmic solution  10/13/10   Historical Provider, MD  fentaNYL (DURAGESIC) 25 MCG/HR patch Apply one patch every 3 days. NOTE dose reduction effective 05/20/2014 06/14/14   Fayrene Helper, MD  furosemide (LASIX) 20 MG tablet Take 1 tablet (20 mg total) by mouth daily as needed. 07/01/14   Fayrene Helper, MD  latanoprost (XALATAN) 0.005 % ophthalmic solution  12/16/10   Historical Provider, MD  Meth-Hyo-M Bl-Na Phos-Ph Sal (URIBEL) 118 MG CAPS Take 1 capsule by mouth every 8 (eight) hours.    Historical Provider, MD  potassium chloride SA (K-DUR,KLOR-CON) 20 MEQ tablet Take 1 tablet (20 mEq total) by mouth daily as needed. 07/24/14   Fayrene Helper, MD  rivaroxaban (XARELTO) 20 MG TABS tablet Take 1 tablet (20 mg total) by mouth daily with supper. 07/05/14   [name], MD  traMADol (ULTRAM) 50 MG tablet Take 50 mg by mouth.    Historical Provider, MD  traZODone (DESYREL) 25 mg TABS tablet Take 0.5 tablets (25 mg total) by mouth at bedtime. 05/27/14   Fayrene Helper, MD   BP 114/76  Pulse 69  Temp(Src) 97.6 F (36.4 C) (Oral)  Resp 18  Ht 5\' 11"  (1.803 m)  Wt 170 lb (77.111 kg)  BMI 23.72 kg/m2  SpO2 99% Physical Exam  Nursing note reviewed. Constitutional: He appears well-developed and well-nourished.  Cardiovascular: Normal rate and regular rhythm.   Pulmonary/Chest: Effort normal and breath sounds normal.  Musculoskeletal:  Bilateral lower extremity edema  Neurological: He is alert.  Oriented to person  place and time, but doesn't answer all question regarding health appropriately  Skin:  Bruising noted from left hip and down thigh. Full rom    ED Course  Procedures (including critical care time) Labs Review Labs Reviewed  CBC WITH DIFFERENTIAL - Abnormal; Notable for the following:    WBC 1.9 (*)    RBC 2.33 (*)    Hemoglobin 7.6 (*)    HCT 23.0 (*)    RDW 16.7 (*)    Platelets 103 (*)    Neutro Abs 1.4 (*)    Lymphs Abs 0.3 (*)    All other components within normal limits  COMPREHENSIVE  METABOLIC PANEL - Abnormal; Notable for the following:    Glucose, Bld 115 (*)    BUN 29 (*)    Creatinine, Ser 1.40 (*)    Total Protein 5.8 (*)    Albumin 3.0 (*)    Total Bilirubin 1.4 (*)    GFR calc non Af Amer 44 (*)    GFR calc Af Amer 51 (*)    All other components within normal limits  POC OCCULT BLOOD, ED - Abnormal; Notable for the following:    Fecal Occult Bld POSITIVE (*)    All other components within normal limits  LIPASE, BLOOD  OCCULT BLOOD X 1 CARD TO LAB, STOOL    Imaging Review Dg Hip Complete Left  08/01/2014   CLINICAL DATA:  Multiple falls  EXAM: LEFT HIP - COMPLETE 2+ VIEW  COMPARISON:  02/22/2011  FINDINGS: No acute fracture. No dislocation. Postoperative changes are noted. Sacral nerve stimulators are noted.  IMPRESSION: No acute bony pathology.   Electronically Signed   By: Maryclare Bean M.D.   On: 08/01/2014 17:28   Dg Hand Complete Left  08/01/2014   CLINICAL DATA:  80 year old male with multiple falls recently. Swelling of hand. No pain. Initial encounter  EXAM: LEFT HAND - COMPLETE 3+ VIEW  COMPARISON:  None.  FINDINGS: No fractures noted.  Widening of the interspace between the scaphoid and the lunate may indicate ligamentous injury.  Soft tissue swelling in a fairly symmetric distribution. Etiology indeterminate.  Degenerative changes sac proximal interphalangeal joint space and third distal interphalangeal joint space.  IMPRESSION: No fractures  noted.  Widening of the interspace between the scaphoid and the lunate may indicate ligamentous injury.  Soft tissue swelling in a fairly symmetric distribution. Etiology indeterminate.  Degenerative changes sac proximal interphalangeal joint space and third distal interphalangeal joint space.   Electronically Signed   By: Chauncey Cruel M.D.   On: 08/01/2014 17:31     EKG Interpretation   Date/Time:  Friday August 02 2014 09:16:23 EDT Ventricular Rate:  63 PR Interval:    QRS Duration: 136 QT Interval:  472 QTC Calculation: 483 R Axis:   -106 Text Interpretation:  Atrial fibrillation IVCD, consider atypical RBBB  Anterolateral infarct, old Confirmed by COOK  MD, BRIAN ([id number]) on  08/02/2014 10:03:11 AM      MDM   Final diagnoses:  Anemia  Fall  Acute GI bleeding    Pt to be admitted to the hospital   Glendell Docker, NP 08/02/14 1150

## 2014-08-02 NOTE — Addendum Note (Signed)
Addended by: Denman George B on: 08/02/2014 09:19 AM   Modules accepted: Orders

## 2014-08-02 NOTE — ED Notes (Signed)
Hospitalist at bedside 

## 2014-08-02 NOTE — H&P (Signed)
[facility] History and Physical  Jaclyn Carew HKV:[id number] DOB: [birth year] DOA: 08/02/2014  Referring physician: ED PCP: [name], MD  Specialists: Cardiology/Gastroenterology  Chief Complaint: GI bleed  HPI:  80 y/o ? Persistent A.Fib CHad2Vasc2 FIEPP=2-9, Chronic systolic HF + symptomatic bradycardia with PPM placed 12/2008, Pill dysphagia follwed by Dr. Lenon Oms EGD with dilatation 03/28/14, interstitial cystitis s/p bladder stimulator placement, Melanoma 2011 s/p excision, prior falsl with hematomas and need for transfusion 02/2011, chronic pancytopenia, mod MV regurg, chronic pain + consitpation and gout as well as mild dementia-last MMSE 20 out of 30 He is a poor historian and his wife know some issues but confabulates and is not sure if multiple parts of his medical history. Essentially based on medical records he is had a couple of falls over the past couple of days. This is an background of multiple falls over the past 6-8 months which is being worked up. He is noncompliant with use of his cane and walker and his PCPs looking into a wheelchair for him. He went to see his primary care physician 10/29 and had multiple x-rays performed without any evidence of acute fracture however widening of interspace between scaphoid and lunate indicating ligamentous injury and referral already has been placed to Dr. Luna Glasgow of orthopedics. It is noted that he was started on Xarelto 07/05/14 as his A. fib went from paroxysmal to persistent and it was felt that he was a candidate for systemic anticoagulation.-Further plans were for him to start amiodarone 200 daily and consider outpatient cardioversion. In either event his primary care physician called him and told patient to present to her office and as fecal occult was positive patient was directed to the emergency room. Patient is noted to be on ibuprofen multiple other over-the-counter medications for chronic low back pain    Review of  Systems: The patient is a poor historian but denies   overt chest pain, shortness breath One-sided weakness Cough Fever Sputum Other issues  Past Medical History  Diagnosis Date  . Chronic systolic heart failure   . Rash and other nonspecific skin eruption   . Costochondritis, acute   . Glaucoma   . Erectile dysfunction   . Visual changes   . Pancytopenia   . Chronic pancreatitis     Based on CT findings  . History of melanoma in situ     JAN 2012--  LEFT HEEL W/ SLN BX  . Chronic back pain   . IC (interstitial cystitis)   . BPH (benign prostatic hypertrophy)   . Cardiac pacemaker     CARDIOLOGIST-- DR Cristopher Peru (LAST PACE [facility] 05-15-2013)  . Asymptomatic carotid artery stenosis     BILATERAL MILD ICA---  <50% PER DUPLEX 03-08-2008  . History of syncope   . Hypertension   . CHB (complete heart block)   . Nonischemic cardiomyopathy   . Severe mitral regurgitation   . Urge urinary incontinence    Past Surgical History  Procedure Laterality Date  . Cataract extraction, bilateral  left  1997/   right 2003  . Pacemaker insertion  12-06-2008  DR GREGG TAYLOR    BiV PPM  --  [employer]  . Colonoscopy  08/18/2006    JJO:ACZY granularity and erosions of the rectum of uncertain significance biopsied.  A long redundant, but otherwise normal-appearing colon.melanosi coli and minimal inflammation on bx  . Inguinal hernia repair Bilateral AS TEEN  . Interstim implant placement  2001  &  2007  . Cysto/ hod/  replacement interstim implant  05-14-2003  . Removal interstim implant/  cyst/  hod  04-14-2004  . Revision interstim implant and replace generator  05-15-2009    left upper buttock for urge urinary incontinence  . Wide excision left heel and sln bx  JUNE 2012  . Tonsillectomy  1950  . Left elbow surgery  2002  . Back surgery  X3  LAST ONE'90's  . Transthoracic echocardiogram  12-06-2008    LVSF 55-60%/  MODERATE MR/  MILD AR/  QUESTION DISTAL POSTERIOR HYPOKINESIS  .  Interstim implant revision N/A 08/28/2013    Procedure: REPLACEMENT OF INTERSTIM-GENERATOR AND LEAD ;  Surgeon: [name], MD;  Location: [facility];  Service: Urology;  Laterality: N/A;  . Esophagogastroduodenoscopy N/A 03/28/2014    Procedure: ESOPHAGOGASTRODUODENOSCOPY (EGD);  Surgeon: [name], MD;  [facility];  Service: Endoscopy;  Laterality: N/A;  3:00-moved to [place] notfied pt  . Savory dilation N/A 03/28/2014    Procedure: SAVORY DILATION;  Surgeon: [name], MD;  [facility];  Service: Endoscopy;  Laterality: N/A;  Venia Minks dilation N/A 03/28/2014    Procedure: Venia Minks DILATION;  Surgeon: [name], MD;  [facility];  Service: Endoscopy;  Laterality: N/A;   Social History:  History   Social History Narrative  . No narrative on file    Allergies  Allergen Reactions  . Codeine Diarrhea  . Penicillins     Other reaction(s): OTHER    Family History  Problem Relation Age of Onset  . Dementia Mother   . Throat cancer Father   . Cancer Father     throat  . Lung cancer Sister     Prior to Admission medications   Medication Sig Start Date End Date Taking? Authorizing Provider  amiodarone (PACERONE) 200 MG tablet Take 1 tablet (200 mg total) by mouth daily. 07/05/14  Yes [name], MD  amitriptyline (ELAVIL) 10 MG tablet Take 10 mg by mouth at bedtime.   Yes Historical Provider, MD  amLODipine (NORVASC) 2.5 MG tablet TAKE 1 TABLET (2.5 MG TOTAL) BY MOUTH DAILY. 07/05/14  Yes Fayrene Helper, MD  donepezil (ARICEPT) 5 MG tablet Take 1 tablet (5 mg total) by mouth at bedtime. 07/08/14 07/08/15 Yes Fayrene Helper, MD  dorzolamide-timolol (COSOPT) 22.3-6.8 MG/ML ophthalmic solution Place 1 drop into both eyes.  10/13/10  Yes Historical Provider, MD  fentaNYL (DURAGESIC) 25 MCG/HR patch Apply one patch every 3 days. NOTE dose reduction effective 05/20/2014 06/14/14  Yes Fayrene Helper, MD   furosemide (LASIX) 20 MG tablet Take 1 tablet (20 mg total) by mouth daily as needed. 07/01/14  Yes Fayrene Helper, MD  latanoprost (XALATAN) 0.005 % ophthalmic solution Place 1 drop into both eyes at bedtime.  12/16/10  Yes Historical Provider, MD  potassium chloride SA (K-DUR,KLOR-CON) 20 MEQ tablet Take 1 tablet (20 mEq total) by mouth daily as needed. 07/24/14  Yes Fayrene Helper, MD  rivaroxaban (XARELTO) 20 MG TABS tablet Take 1 tablet (20 mg total) by mouth daily with supper. 07/05/14  Yes [name], MD  silodosin (RAPAFLO) 8 MG CAPS capsule Take 8 mg by mouth daily with breakfast.   Yes Historical Provider, MD  traMADol (ULTRAM) 50 MG tablet Take 50 mg by mouth every 4 (four) hours as needed for moderate pain.    Yes Historical Provider, MD  traZODone (DESYREL) 50 MG tablet Take 25 mg by  mouth at bedtime.   Yes Historical Provider, MD   Physical Exam: Filed Vitals:   08/02/14 0930 08/02/14 1000 08/02/14 1030 08/02/14 1100  BP: 107/72 109/76 116/75 152/80  Pulse: 62 61 61 61  Temp:      TempSrc:      Resp: 19 20 17 23   Height:      Weight:      SpO2: 93% 90% 94% 91%     General:  Alert but not oriented. Rambling speech.y  ENT: Postoperative changes to above left eye  Neck: JVD distended no bruit  Cards: W2-B7 holosystolic murmur noted across precordium  Respiratory-clear bilaterally with no added sounds   Abdomen: No tenderness no rebound no epigastric tenderness  kin: Lower extremity edema grade 2  Psychiatric: Euthymic but confused Neurologic : Grossly intact moves all 4 limbs equally no focal deficit to motor strength  abs on Admission:  Basic Metabolic Panel:  Recent Labs Lab 08/01/14 1534 08/02/14 0948  NA 144 143  K 4.0 3.9  CL 107 107  CO2 27 28  GLUCOSE 87 115*  BUN 28* 29*  CREATININE 1.31 1.40*  CALCIUM 8.5 8.4   Liver Function Tests:  Recent Labs Lab 08/02/14 0948  AST 20  ALT 13  ALKPHOS 53  BILITOT 1.4*  PROT 5.8*   ALBUMIN 3.0*    Recent Labs Lab 08/02/14 0948  LIPASE 26   No results found for this basename: AMMONIA,  in the last 168 hours CBC:  Recent Labs Lab 08/01/14 1534 08/02/14 0948  WBC 2.7* 1.9*  NEUTROABS  --  1.4*  HGB 8.3* 7.6*  HCT 24.6* 23.0*  MCV 98.8 98.7  PLT 124* 103*   Cardiac Enzymes: No results found for this basename: CKTOTAL, CKMB, CKMBINDEX, TROPONINI,  in the last 168 hours  BNP (last 3 results) No results found for this basename: PROBNP,  in the last 8760 hours CBG: No results found for this basename: GLUCAP,  in the last 168 hours  Radiological Exams on Admission: Dg Chest 2 View  08/02/2014   CLINICAL DATA:  Anemia.  EXAM: CHEST  2 VIEW  COMPARISON:  July 25, 2009.  FINDINGS: Stable cardiomegaly. Central pulmonary vascular congestion is again noted. Left-sided pacemaker is unchanged in position. No pneumothorax or pleural effusion is noted. No acute pulmonary disease is noted. Bony thorax is intact.  IMPRESSION: Stable cardiomegaly and central pulmonary vascular congestion.   Electronically Signed   By: Sabino Dick M.D.   On: 08/02/2014 10:50   Dg Hip Complete Left  08/01/2014   CLINICAL DATA:  Multiple falls  EXAM: LEFT HIP - COMPLETE 2+ VIEW  COMPARISON:  02/22/2011  FINDINGS: No acute fracture. No dislocation. Postoperative changes are noted. Sacral nerve stimulators are noted.  IMPRESSION: No acute bony pathology.   Electronically Signed   By: Maryclare Bean M.D.   On: 08/01/2014 17:28   Ct Head Wo Contrast  08/02/2014   CLINICAL DATA:  Weakness for 7 days with falls. Altered level of consciousness.  EXAM: CT HEAD WITHOUT CONTRAST  TECHNIQUE: Contiguous axial images were obtained from the base of the skull through the vertex without intravenous contrast.  COMPARISON:  None.  FINDINGS: Mild age related volume loss/atrophy. No acute intracranial abnormality. Specifically, no hemorrhage, hydrocephalus, mass lesion, acute infarction, or significant  intracranial injury. No acute calvarial abnormality. Visualized paranasal sinuses and mastoids clear. Orbital soft tissues unremarkable.  IMPRESSION: No acute intracranial abnormality.   Electronically Signed   By: Lennette Bihari  Dover M.D.   On: 08/02/2014 10:48   Dg Hand Complete Left  08/01/2014   CLINICAL DATA:  80 year old male with multiple falls recently. Swelling of hand. No pain. Initial encounter  EXAM: LEFT HAND - COMPLETE 3+ VIEW  COMPARISON:  None.  FINDINGS: No fractures noted.  Widening of the interspace between the scaphoid and the lunate may indicate ligamentous injury.  Soft tissue swelling in a fairly symmetric distribution. Etiology indeterminate.  Degenerative changes sac proximal interphalangeal joint space and third distal interphalangeal joint space.  IMPRESSION: No fractures noted.  Widening of the interspace between the scaphoid and the lunate may indicate ligamentous injury.  Soft tissue swelling in a fairly symmetric distribution. Etiology indeterminate.  Degenerative changes sac proximal interphalangeal joint space and third distal interphalangeal joint space.   Electronically Signed   By: Chauncey Cruel M.D.   On: 08/01/2014 17:31    EKG: Independently reviewed. Atrial fibrillation PR interval cannot be ascertained axis -397 episodic PVCs with incomplete right bundle   Principal Problem:   Acute upper GI bleeding-likely acute upper GI bleed. Rectal exam by ED personnel did not reveal any gross blood however he is taking NSAIDs over-the-counter and has been on Zaroxolyn. Listed that Dr. [name] is seen in the past but that he has declined colonoscopies- Last one performed was in 2007 that just showed melanosis coli. He had a recent upper endoscopy 03/2014 showing normal appearing mucosa of the gastric region.  We will transfuse him 1 unit packed red cells as his baseline hemoglobin is in the 11-12 range as of 06/2013. Currently today to 7.6 on admission.  We will also start IV  Protonix twice a day and await further input from GI. Active Problems:   Pancytopenia-unclear etiology. This is been a long-standing complaint and he is followed in the past with [facility]..  It is noted also that because of hoarseness he was planned to have an ENT evaluation. Based on findings on CT scan however there is no acute mass or lesion so this can be done in interval time.  Acute decompensated and superimposed on Chronic systolic heart failure-his blood pressure initially was low and he was started on saline 75 cc/h however given his slightly volume overloaded on exam with elevated JVD and lower extremity edema, he will need IV Lasix. We will discontinue the saline and give him blood with IV Lasix.  Acute kidney injury-more likely a reflection of azotemia from upper GI bleed. He will continue his Lasix and we'll repeat labs in the morning. His total bilirubin is slightly elevated and we will follow this up   INTERSTITIAL CYSTITIS-he has a stimulator in place and will need follow-up with his urologist Dr. Jeffie Pollock regarding the battery.  He hasn't been seen since 2014. We will substitute Flomax for rapiflo   Malignant melanoma-needs interval consideration for skin screening. May be related to his pancytopenia.   Alzheimer's dementia-at least moderate. He may continue Aricept. Note that sometimes this medication is associated with GI bleeding   Lower extremity weakness-chronic 75 minutes   full code Discussed with wife at bedside Admit to telemetry   Verlon Au [facility] Pager [phone] If 7PM-7AM, please contact night-coverage [email or web address] Password [email or web address] 08/02/2014, 11:30 AM

## 2014-08-02 NOTE — Progress Notes (Signed)
Patient received one unit of packed red blood cells per MD order @1515 . Patient had no signs of a reaction throughout transfusion or any complaints. Patients VS were within normal limits during transfusion. Will continue to monitor patient at this time.

## 2014-08-02 NOTE — ED Notes (Signed)
Pt sent here by Dr. Moshe Cipro for blood in stool. Pt reports dizziness and generalized weakness for several days. Pt alert and oriented. nad noted.

## 2014-08-02 NOTE — ED Provider Notes (Signed)
Medical screening examination/treatment/procedure(s) were conducted as a shared visit with non-physician practitioner(s) and myself.  I personally evaluated the patient during the encounter.   EKG Interpretation   Date/Time:  Friday August 02 2014 09:16:23 EDT Ventricular Rate:  63 PR Interval:    QRS Duration: 136 QT Interval:  472 QTC Calculation: 483 R Axis:   -106 Text Interpretation:  Atrial fibrillation IVCD, consider atypical RBBB  Anterolateral infarct, old Confirmed by Jiyan Walkowski  MD, Justyce Baby ([id number]) on  08/02/2014 10:03:11 AM     Pt is hemodynamically stable.  C/o rectal bleed.  Rectal heme pos.  Hgb 7.6.    Admit  Nat Christen, MD 08/02/14 1336

## 2014-08-02 NOTE — Telephone Encounter (Signed)
Patient in for rectal exam and sent to ED due to positive FOB.

## 2014-08-02 NOTE — Consult Note (Signed)
Referring Provider: Nita Sells, MD Primary Care Physician:  [name], MD Primary Gastroenterologist:  Garfield Cornea, MD  Reason for Consultation:  Heme + stool  HPI: [name] is a 80 y.o. male who presented to ER after found to have significant anemia, heme + stool. Patient reports his stools have been "dark". There has been no frank bleeding reported. H/O heme positive stools within the past one year, we have tried to get him to have colonoscopy on multiple occasions but either declined or had to be cancelled when prep was not followed properly.   Recently started Xarelto (07/05/14) due to persistent Afib and increased CVA risk.   Also with h/o pancytopenia remotely followed by hematology. No evidence of cirrhosis.   Previously discussed Bladder inhibitor with Dr. Mikle Bosworth nurse 09/2013 and does not need to be turned off for colonoscopy.   Labs yesterday showed Hgb of 8.3, last one available from one year ago was 11.2. He does have pancytopenia. Patient denies constipation, diarrhea, abd pain, anorexia, dysphagia, heartburn, weight loss. He has had multiple falls and has extensive bruising involving the lower back and right posterior leg all the way down to the calf.   EGD in 03/2014 for dysphagia as outlined below. Previous medical records indicated h/o constipation.    Prior to Admission medications   Medication Sig Start Date End Date Taking? Authorizing Provider  amiodarone (PACERONE) 200 MG tablet Take 1 tablet (200 mg total) by mouth daily. 07/05/14  Yes [name], MD  amitriptyline (ELAVIL) 10 MG tablet Take 10 mg by mouth at bedtime.   Yes Historical Provider, MD  amLODipine (NORVASC) 2.5 MG tablet TAKE 1 TABLET (2.5 MG TOTAL) BY MOUTH DAILY. 07/05/14  Yes Fayrene Helper, MD  donepezil (ARICEPT) 5 MG tablet Take 1 tablet (5 mg total) by mouth at bedtime. 07/08/14 07/08/15 Yes Fayrene Helper, MD  dorzolamide-timolol (COSOPT) 22.3-6.8 MG/ML ophthalmic  solution Place 1 drop into both eyes.  10/13/10  Yes Historical Provider, MD  fentaNYL (DURAGESIC) 25 MCG/HR patch Apply one patch every 3 days. NOTE dose reduction effective 05/20/2014 06/14/14  Yes Fayrene Helper, MD  furosemide (LASIX) 20 MG tablet Take 1 tablet (20 mg total) by mouth daily as needed. 07/01/14  Yes Fayrene Helper, MD  latanoprost (XALATAN) 0.005 % ophthalmic solution Place 1 drop into both eyes at bedtime.  12/16/10  Yes Historical Provider, MD  potassium chloride SA (K-DUR,KLOR-CON) 20 MEQ tablet Take 1 tablet (20 mEq total) by mouth daily as needed. 07/24/14  Yes Fayrene Helper, MD  rivaroxaban (XARELTO) 20 MG TABS tablet Take 1 tablet (20 mg total) by mouth daily with supper. 07/05/14  Yes [name], MD  silodosin (RAPAFLO) 8 MG CAPS capsule Take 8 mg by mouth daily with breakfast.   Yes Historical Provider, MD  traMADol (ULTRAM) 50 MG tablet Take 50 mg by mouth every 4 (four) hours as needed for moderate pain.    Yes Historical Provider, MD  traZODone (DESYREL) 50 MG tablet Take 25 mg by mouth at bedtime.   Yes Historical Provider, MD    Current Facility-Administered Medications  Medication Dose Route Frequency Provider Last Rate Last Dose  . pantoprazole (PROTONIX) injection 40 mg  40 mg Intravenous Q12H Nita Sells, MD   40 mg at 08/02/14 1235    Allergies as of 08/02/2014 - Review Complete 08/02/2014  Allergen Reaction Noted  . Codeine Diarrhea 01/31/2008  . Penicillins  05/20/2014    Past Medical History  Diagnosis Date  . Chronic systolic heart failure   . Rash and other nonspecific skin eruption   . Costochondritis, acute   . Glaucoma   . Erectile dysfunction   . Visual changes   . Pancytopenia   . Chronic pancreatitis     Based on CT findings  . History of melanoma in situ     JAN 2012--  LEFT HEEL W/ SLN BX  . Chronic back pain   . IC (interstitial cystitis)   . BPH (benign prostatic hypertrophy)   . Cardiac pacemaker      CARDIOLOGIST-- DR Cristopher Peru (LAST PACE [facility] 05-15-2013)  . Asymptomatic carotid artery stenosis     BILATERAL MILD ICA---  <50% PER DUPLEX 03-08-2008  . History of syncope   . Hypertension   . CHB (complete heart block)   . Nonischemic cardiomyopathy   . Severe mitral regurgitation   . Urge urinary incontinence   . A-fib     Past Surgical History  Procedure Laterality Date  . Cataract extraction, bilateral  left  1997/   right 2003  . Pacemaker insertion  12-06-2008  DR GREGG TAYLOR    BiV PPM  --  [employer]  . Colonoscopy  08/18/2006    WGN:FAOZ granularity and erosions of the rectum of uncertain significance biopsied.  A long redundant, but otherwise normal-appearing colon.melanosi coli and minimal inflammation on bx  . Inguinal hernia repair Bilateral AS TEEN  . Interstim implant placement  2001  &  2007  . Cysto/ hod/  replacement interstim implant  05-14-2003  . Removal interstim implant/  cyst/  hod  04-14-2004  . Revision interstim implant and replace generator  05-15-2009    left upper buttock for urge urinary incontinence  . Wide excision left heel and sln bx  JUNE 2012  . Tonsillectomy  1950  . Left elbow surgery  2002  . Back surgery  X3  LAST ONE'90's  . Transthoracic echocardiogram  12-06-2008    LVSF 55-60%/  MODERATE MR/  MILD AR/  QUESTION DISTAL POSTERIOR HYPOKINESIS  . Interstim implant revision N/A 08/28/2013    Procedure: REPLACEMENT OF INTERSTIM-GENERATOR AND LEAD ;  Surgeon: [name], MD;  Location: [place];  Service: Urology;  Laterality: N/A;  . Esophagogastroduodenoscopy N/A 03/28/2014    Dr. [name]: normal esophagus s/p dilation, mosaic appearance - chronic inflammation noted on bx  . Savory dilation N/A 03/28/2014    Procedure: SAVORY DILATION;  Surgeon: [name], MD;  [facility];  Service: Endoscopy;  Laterality: N/A;  Venia Minks dilation N/A 03/28/2014    Procedure: Venia Minks DILATION;  Surgeon: [name], MD;  Location[facility];  Service: Endoscopy;  Laterality: N/A;    Family History  Problem Relation Age of Onset  . Dementia Mother   . Throat cancer Father   . Cancer Father     throat  . Lung cancer Sister     History   Social History  . Marital Status: Married    Spouse Name: N/A    Number of Children: N/A  . Years of Education: N/A   Occupational History  . retired     Social History Main Topics  . Smoking status: Former Smoker    Quit date: 08/24/1997  . Smokeless tobacco: Not on file  . Alcohol Use: No  . Drug Use: No  . Sexual Activity: Not on file   Other Topics Concern  . Not on file  Social History Narrative  . No narrative on file     ROS: patient gives negative ROS.  General: Negative for anorexia, weight loss, fever, chills, fatigue, weakness. Eyes: Negative for vision changes.  ENT: Negative for hoarseness, difficulty swallowing , nasal congestion. CV: Negative for chest pain, angina, palpitations, dyspnea on exertion, peripheral edema.  Respiratory: Negative for dyspnea at rest, dyspnea on exertion, cough, sputum, wheezing.  GI: See history of present illness. GU:  Negative for dysuria, hematuria, urinary incontinence, urinary frequency, nocturnal urination.  MS: Negative for joint pain, low back pain.  Derm: Negative for rash or itching.  Neuro: Negative for weakness, abnormal sensation, seizure, frequent headaches, memory loss, confusion.  Psych: Negative for anxiety, depression, suicidal ideation, hallucinations.  Endo: Negative for unusual weight change.  Heme: Negative for bruising or bleeding. Allergy: Negative for rash or hives.       Physical Examination: Vital signs in last 24 hours: Temp:  [97.6 F (36.4 C)] 97.6 F (36.4 C) (10/30 0912) Pulse Rate:  [60-69] 61 (10/30 1230) Resp:  [17-23] 18 (10/30 1230) BP: (107-152)/(70-101) 143/93 mmHg (10/30 1230) SpO2:  [90 %-100 %] 97 % (10/30 1230) Weight:  [170 lb (77.111  kg)-171 lb (77.565 kg)] 170 lb (77.111 kg) (10/30 0907)    General: Well-nourished, well-developed in no acute distress. Answers questions briefly. Wife provides some information.  Head: Normocephalic, atraumatic.   Eyes: Conjunctiva pink, no icterus. Mouth: Oropharyngeal mucosa moist and pink , no lesions erythema or exudate. Neck: Supple without thyromegaly, masses, or lymphadenopathy.  Lungs: Clear to auscultation bilaterally.  Heart: Regular rate and rhythm, no murmurs rubs or gallops.  Abdomen: Bowel sounds are normal, nontender, nondistended, no hepatosplenomegaly or masses, no abdominal bruits or    hernia , no rebound or guarding.   Rectal: not performed Extremities: No lower extremity edema, clubbing, deformity.  Neuro: Alert and oriented x 4 , grossly normal neurologically.  Skin: Warm and dry, no rash or jaundice. Bruising noted from mid-lower back all the way down the left posterior leg to the calf.   Psych: Alert and cooperative, normal mood and affect.        Intake/Output from previous day:   Intake/Output this shift:    Lab Results: CBC  Recent Labs  08/01/14 1534 08/02/14 0948  WBC 2.7* 1.9*  HGB 8.3* 7.6*  HCT 24.6* 23.0*  MCV 98.8 98.7  PLT 124* 103*   Lab Results  Component Value Date   IRON 57 08/01/2014         FERRITIN 119 08/01/2014     BMET  Recent Labs  08/01/14 1534 08/02/14 0948  NA 144 143  K 4.0 3.9  CL 107 107  CO2 27 28  GLUCOSE 87 115*  BUN 28* 29*  CREATININE 1.31 1.40*  CALCIUM 8.5 8.4   LFT  Recent Labs  08/02/14 0948  BILITOT 1.4*  ALKPHOS 53  AST 20  ALT 13  PROT 5.8*  ALBUMIN 3.0*    Lipase  Recent Labs  08/02/14 0948  LIPASE 26    PT/INR No results found for this basename: LABPROT, INR,  in the last 72 hours    Imaging Studies: Dg Chest 2 View  08/02/2014   CLINICAL DATA:  Anemia.  EXAM: CHEST  2 VIEW  COMPARISON:  July 25, 2009.  FINDINGS: Stable cardiomegaly. Central pulmonary vascular  congestion is again noted. Left-sided pacemaker is unchanged in position. No pneumothorax or pleural effusion is noted. No acute pulmonary disease is noted.  Bony thorax is intact.  IMPRESSION: Stable cardiomegaly and central pulmonary vascular congestion.   Electronically Signed   By: Sabino Dick M.D.   On: 08/02/2014 10:50      Ct Head Wo Contrast  08/02/2014   CLINICAL DATA:  Weakness for 7 days with falls. Altered level of consciousness.  EXAM: CT HEAD WITHOUT CONTRAST  TECHNIQUE: Contiguous axial images were obtained from the base of the skull through the vertex without intravenous contrast.  COMPARISON:  None.  FINDINGS: Mild age related volume loss/atrophy. No acute intracranial abnormality. Specifically, no hemorrhage, hydrocephalus, mass lesion, acute infarction, or significant intracranial injury. No acute calvarial abnormality. Visualized paranasal sinuses and mastoids clear. Orbital soft tissues unremarkable.  IMPRESSION: No acute intracranial abnormality.   Electronically Signed   By: Rolm Baptise M.D.   On: 08/02/2014 10:48       Impression: 80 y/o male with PMH significant for Afib recently started on Xarelto, CHF, symptomatic bradycardia with pacemaker, chronic pancytopenia (previously seen by hematology), bladder inhibitor, melanoma 2011 who presents with pancytopenia with significant anemia (unclear if acute or chronic with no recent labs for comparison), heme + stool. EGD 03/2014 as outlined. Unclear if he has had melena, patient reports dark stool. No melena reported today by clinician. Remote colonoscopy in 2007.   Plan: 1. Consider colonoscopy +/- EGD if negative. Patient would likely benefit from two full days of clear liquids given chronic constipation. Full Trilytely prep. To discuss with Dr. Oneida Alar.  2. Transfuse as needed. 3. PPI. 4. Avoid anticoagulants for now.   We would like to thank you for the opportunity to participate in the care of [name].  Addendum:  discussed with Dr. Oneida Alar who will be seeing patient later this afternoon. Will go ahead and start clear liquid diet.    LOS: 0 days   Neil Crouch  08/02/2014, 1:04 PM

## 2014-08-02 NOTE — Progress Notes (Signed)
NURSING PROGRESS NOTE  [name] [id number] Admitted to 307: 08/02/2014 Attending Provider: Nita Sells, MD    [name] is a 80 y.o. male patient admitted from ED awake, alert  & orientated  X 4,  Full Code, VSS - Blood pressure 141/96, pulse 60, temperature 98.4 F (36.9 C), temperature source Oral, resp. rate 18, height 5\' 11"  (1.803 m), weight 77.111 kg (170 lb), SpO2 100.00%., O2    2 L nasal cannular, no c/o shortness of breath, no c/o chest pain, no distress noted. Tele # 8 placed and pt is currently running:NSR.   IV site WDL:  Left AC with a transparent dsg that's clean dry and intact.  Allergies:   Allergies  Allergen Reactions  . Codeine Diarrhea  . Penicillins     Other reaction(s): OTHER     Past Medical History  Diagnosis Date  . Chronic systolic heart failure   . Rash and other nonspecific skin eruption   . Costochondritis, acute   . Glaucoma   . Erectile dysfunction   . Visual changes   . Pancytopenia   . Chronic pancreatitis     Based on CT findings  . History of melanoma in situ     JAN 2012--  LEFT HEEL W/ SLN BX  . Chronic back pain   . IC (interstitial cystitis)   . BPH (benign prostatic hypertrophy)   . Cardiac pacemaker     CARDIOLOGIST-- DR Cristopher Peru ([facility] PACE [facility] 05-15-2013)  . Asymptomatic carotid artery stenosis     BILATERAL MILD ICA---  <50% PER DUPLEX 03-08-2008  . History of syncope   . Hypertension   . CHB (complete heart block)   . Nonischemic cardiomyopathy   . Severe mitral regurgitation   . Urge urinary incontinence   . A-fib     History:  obtained from patient.  Pt orientation to unit, room and routine. Admission INP armband ID verified with patient/family, and in place. SR up x 2, fall risk assessment complete with Patient and family verbalizing understanding of risks associated with falls. Pt verbalizes an understanding of how to use the call bell and to call for help before getting out of bed.  Skin, clean-dry-  intact with evidence of bruising to the left thigh and lower back side from a previous fall prior to admission.   No evidence of skin break down noted on exam.    Will cont to monitor and assist as needed.  Regino Bellow, RN 08/02/2014

## 2014-08-03 DIAGNOSIS — D638 Anemia in other chronic diseases classified elsewhere: Secondary | ICD-10-CM

## 2014-08-03 LAB — COMPREHENSIVE METABOLIC PANEL
ALK PHOS: 51 U/L (ref 39–117)
ALT: 12 U/L (ref 0–53)
ANION GAP: 10 (ref 5–15)
AST: 19 U/L (ref 0–37)
Albumin: 2.9 g/dL — ABNORMAL LOW (ref 3.5–5.2)
BUN: 24 mg/dL — AB (ref 6–23)
CO2: 28 mEq/L (ref 19–32)
CREATININE: 1.22 mg/dL (ref 0.50–1.35)
Calcium: 8.3 mg/dL — ABNORMAL LOW (ref 8.4–10.5)
Chloride: 106 mEq/L (ref 96–112)
GFR calc Af Amer: 61 mL/min — ABNORMAL LOW (ref >90)
GFR calc non Af Amer: 52 mL/min — ABNORMAL LOW (ref >90)
Glucose, Bld: 89 mg/dL (ref 70–99)
Potassium: 3.9 mEq/L (ref 3.7–5.3)
Sodium: 144 mEq/L (ref 137–147)
TOTAL PROTEIN: 5.7 g/dL — AB (ref 6.0–8.3)
Total Bilirubin: 2.1 mg/dL — ABNORMAL HIGH (ref 0.3–1.2)

## 2014-08-03 LAB — TYPE AND SCREEN
ABO/RH(D): B POS
ANTIBODY SCREEN: NEGATIVE
UNIT DIVISION: 0

## 2014-08-03 LAB — CBC
HCT: 28.2 % — ABNORMAL LOW (ref 39.0–52.0)
HEMOGLOBIN: 9.4 g/dL — AB (ref 13.0–17.0)
MCH: 31.9 pg (ref 26.0–34.0)
MCHC: 33.3 g/dL (ref 30.0–36.0)
MCV: 95.6 fL (ref 78.0–100.0)
Platelets: 115 10*3/uL — ABNORMAL LOW (ref 150–400)
RBC: 2.95 MIL/uL — ABNORMAL LOW (ref 4.22–5.81)
RDW: 17.6 % — AB (ref 11.5–15.5)
WBC: 2.4 10*3/uL — ABNORMAL LOW (ref 4.0–10.5)

## 2014-08-03 LAB — PROTIME-INR
INR: 1.45 (ref 0.00–1.49)
PROTHROMBIN TIME: 17.8 s — AB (ref 11.6–15.2)

## 2014-08-03 LAB — APTT: aPTT: 38 seconds — ABNORMAL HIGH (ref 24–37)

## 2014-08-03 MED ORDER — PANTOPRAZOLE SODIUM 40 MG PO TBEC
40.0000 mg | DELAYED_RELEASE_TABLET | Freq: Two times a day (BID) | ORAL | Status: DC
  Administered 2014-08-03 – 2014-08-06 (×5): 40 mg via ORAL
  Filled 2014-08-03 (×6): qty 1

## 2014-08-03 MED ORDER — POLYETHYLENE GLYCOL 3350 17 G PO PACK
17.0000 g | PACK | ORAL | Status: AC
  Administered 2014-08-03 – 2014-08-04 (×6): 17 g via ORAL
  Filled 2014-08-03 (×3): qty 1

## 2014-08-03 MED ORDER — BISACODYL 5 MG PO TBEC
10.0000 mg | DELAYED_RELEASE_TABLET | ORAL | Status: AC
  Administered 2014-08-03: 10 mg via ORAL
  Filled 2014-08-03: qty 2

## 2014-08-03 MED ORDER — SODIUM CHLORIDE 0.9 % IV SOLN
INTRAVENOUS | Status: DC
  Administered 2014-08-03: 22:00:00 via INTRAVENOUS

## 2014-08-03 MED ORDER — AMLODIPINE BESYLATE 5 MG PO TABS
2.5000 mg | ORAL_TABLET | Freq: Every day | ORAL | Status: DC
  Administered 2014-08-06: 2.5 mg via ORAL
  Filled 2014-08-03: qty 1

## 2014-08-03 MED ORDER — PROMETHAZINE HCL 25 MG/ML IJ SOLN
12.5000 mg | Freq: Once | INTRAMUSCULAR | Status: AC
  Administered 2014-08-04: 12.5 mg via INTRAVENOUS
  Filled 2014-08-03: qty 1

## 2014-08-03 NOTE — Progress Notes (Signed)
[name] GSP:[id number] DOB: 1925-12-20 DOA: 08/02/2014 PCP: [name], MD  Brief narrative:  80 y/o ? Persistent A.Fib CHad2Vasc2 QPEAK=3-5, Chronic systolic HF + symptomatic bradycardia with PPM placed 12/2008, Pill dysphagia follwed by Dr. Lenon Oms EGD with dilatation 03/28/14, interstitial cystitis s/p bladder stimulator placement, Melanoma 2011 s/p excision, prior falsl with hematomas and need for transfusion 02/2011, chronic pancytopenia, mod MV regurg, chronic pain + consitpation and gout as well as mild dementia-last MMSE 20 out of 30 started on Xarelto 07/05/14 as his A. fib went from paroxysmal to persistent and it was felt that he was a candidate for systemic anticoagulation-Further plans were for him to start amiodarone 200 daily and consider outpatient cardioversion He presented with overall weakness and dark stools in setting of continued NSAID use.  Decline colonoscopies in the past-last one 2007 Dr. [name].   Past medical history-As per Problem list Chart reviewed as below- reviewed  Consultants:   GI -fileds  Procedures:  None yet  Antibiotics:  none   Subjective  Well More oriented No further dark stool Tol po No cp or sob scheduled for colonoscopy 10:00 am 11/1   Objective    Interim History:   Telemetry:    Objective: Filed Vitals:   08/02/14 1815 08/02/14 2238 08/03/14 0451 08/03/14 0528  BP: 145/87 128/84 144/95   Pulse: 54 59 60   Temp: 97.7 F (36.5 C) 98.3 F (36.8 C) 97.5 F (36.4 C)   TempSrc: Oral Oral Oral   Resp: 20 20 20    Height:      Weight:    78.3 kg (172 lb 9.9 oz)  SpO2: 100% 99% 97%     Intake/Output Summary (Last 24 hours) at 08/03/14 1008 Last data filed at 08/03/14 0452  Gross per 24 hour  Intake    743 ml  Output   2375 ml  Net  -1632 ml    Exam:  General: eomi, NCAT Cardiovascular:  s1 s2 no m/r/g Respiratory: clear, no added soudn Abdomen: soft, NT/ND, no rebound Skin no LE edema Neuro  intact  Data Reviewed: Basic Metabolic Panel:  Recent Labs Lab 08/01/14 1534 08/02/14 0948 08/03/14 0641  NA 144 143 144  K 4.0 3.9 3.9  CL 107 107 106  CO2 27 28 28   GLUCOSE 87 115* 89  BUN 28* 29* 24*  CREATININE 1.31 1.40* 1.22  CALCIUM 8.5 8.4 8.3*   Liver Function Tests:  Recent Labs Lab 08/02/14 0948 08/03/14 0641  AST 20 19  ALT 13 12  ALKPHOS 53 51  BILITOT 1.4* 2.1*  PROT 5.8* 5.7*  ALBUMIN 3.0* 2.9*    Recent Labs Lab 08/02/14 0948  LIPASE 26   No results found for this basename: AMMONIA,  in the last 168 hours CBC:  Recent Labs Lab 08/01/14 1534 08/02/14 0948 08/03/14 0641  WBC 2.7* 1.9* 2.4*  NEUTROABS  --  1.4*  --   HGB 8.3* 7.6* 9.4*  HCT 24.6* 23.0* 28.2*  MCV 98.8 98.7 95.6  PLT 124* 103* 115*   Cardiac Enzymes: No results found for this basename: CKTOTAL, CKMB, CKMBINDEX, TROPONINI,  in the last 168 hours BNP: No components found with this basename: POCBNP,  CBG: No results found for this basename: GLUCAP,  in the last 168 hours  No results found for this or any previous visit (from the past 240 hour(s)).   Studies:              All Imaging reviewed and is  as per above notation   Scheduled Meds: . amiodarone  200 mg Oral Daily  . amitriptyline  10 mg Oral QHS  . amLODipine  2.5 mg Oral Daily  . donepezil  5 mg Oral QHS  . dorzolamide-timolol  1 drop Both Eyes BID  . fentaNYL  25 mcg Transdermal Q72H  . latanoprost  1 drop Both Eyes QHS  . pantoprazole  40 mg Oral BID AC  . sodium chloride  3 mL Intravenous Q12H  . tamsulosin  0.4 mg Oral QPC breakfast  . traZODone  25 mg Oral QHS   Continuous Infusions:    Assessment/Plan:  1. Possible UGIB-per GI-endoscope in am.  Hb up to 9.6 after I 1U PRBC.  Monitor cbc am-Cont Protonix 40 bid as PO 2. Pancytopenia-further OP eval- Myelodysplasia documented in [facility] records 3. Mild decompensated AECHF, Systolic-Lasix  from home dose 20 prn to 20 daily 4. Interstitial  cystitis-continue Flomax 0.4 and stimulator 5. Pre-renal azotemia 2/2 to UGIB-bun/creat 28/1.31-->24/1.2.   6. Hyperbilirubinemia-unclear etiology- was wnl 1.1 06/09/13 Alk Phos normal+ Transaminases nl so unlikely hepatic origin Obtain fractionated bilirubin LDH/Haptoglobin-could have consumptive , hemolytic process as well.  If persists will need Heme input either here or [facility] 7. Afib rate controlled-perisistent-Now off Xarelto-cont Amiodarone 200 qd 8. Bradycardia s/p PPM 2010 9. dementia-mild-Cont aricept 5 qd-slight risk of GI bleed with this class of meds 10. htn-continue Amlodipine 2.5 daily    Code Status: full Family Communication:  Discussed with wife bedside Disposition Plan:  inpatient   Verneita Griffes, MD  [facility] Pager [phone] 08/03/2014, 10:08 AM    LOS: 1 day

## 2014-08-03 NOTE — Progress Notes (Addendum)
Patient ID: Hayes Czaja, male   DOB: October 20, 1925, 80 y.o.   MRN: [id number]   Assessment/Plan: NO SIGNS OR SYMPTOMS OF ACTIVE GI BLEED. DROP IN BLOOD COUNT MOST LIKELY DUE TO LARGE HEMATOMA. HOWEVER PT HAS PANCYTOPENIA/HEME POSITIVE AND ALL POTENTIAL SOURCE FOR GI BOLOD LOSS SHOULD BE IDENTIFIED.  PLAN: 1. MIRALAX/DULCOLAX PREP 2. TAP WATER ENEMAS NOV 1 3. TCS NOV 1 AT 1000.  PT NEEDs PHENERGAN PRIOR TO TCS.  DISCUSSED PROCEDURE, BENEFITS, AND RISKS.   Subjective: Since I last evaluated the patient NO BRBPR OR MELENA. TOLERATING POs. WOULD LIKE TO MOVE FORWARD WITH TCS.  Objective: Vital signs in last 24 hours: Filed Vitals:   08/03/14 0451  BP: 144/95  Pulse: 60  Temp: 97.5 F (36.4 C)  Resp: 20   General appearance: alert LUNGS: CLEAR HEART: NORMAL RATE ABDOMEN: BOWEL SOUNDS NL, NON-DISTENDED, NONTENDER  Lab Results: HB 7.6 TO 9.4 PLTCT 115KWBC2.4     Studies/Results: Dg Chest 2 View  08/02/2014   CLINICAL DATA:  Anemia.  EXAM: CHEST  2 VIEW  COMPARISON:  July 25, 2009.  FINDINGS: Stable cardiomegaly. Central pulmonary vascular congestion is again noted. Left-sided pacemaker is unchanged in position. No pneumothorax or pleural effusion is noted. No acute pulmonary disease is noted. Bony thorax is intact.  IMPRESSION: Stable cardiomegaly and central pulmonary vascular congestion.   Electronically Signed   By: Sabino Dick M.D.   On: 08/02/2014 10:50   Ct Head Wo Contrast  08/02/2014   CLINICAL DATA:  Weakness for 7 days with falls. Altered level of consciousness.  EXAM: CT HEAD WITHOUT CONTRAST  TECHNIQUE: Contiguous axial images were obtained from the base of the skull through the vertex without intravenous contrast.  COMPARISON:  None.  FINDINGS: Mild age related volume loss/atrophy. No acute intracranial abnormality. Specifically, no hemorrhage, hydrocephalus, mass lesion, acute infarction, or significant intracranial injury. No acute calvarial abnormality. Visualized  paranasal sinuses and mastoids clear. Orbital soft tissues unremarkable.  IMPRESSION: No acute intracranial abnormality.   Electronically Signed   By: Rolm Baptise M.D.   On: 08/02/2014 10:48    Medications: I have reviewed the patient's current medications.   LOS: 5 days   Barney Drain 03/14/2014, 2:23 PM

## 2014-08-03 NOTE — Consult Note (Signed)
REVIEWED-plan TCS if pt agreeable.

## 2014-08-04 ENCOUNTER — Encounter (HOSPITAL_COMMUNITY): Payer: Self-pay | Admitting: *Deleted

## 2014-08-04 ENCOUNTER — Inpatient Hospital Stay (HOSPITAL_COMMUNITY): Payer: Medicare Other

## 2014-08-04 DIAGNOSIS — R17 Unspecified jaundice: Secondary | ICD-10-CM

## 2014-08-04 DIAGNOSIS — D6489 Other specified anemias: Secondary | ICD-10-CM

## 2014-08-04 LAB — CBC WITH DIFFERENTIAL/PLATELET
BASOS PCT: 0 % (ref 0–1)
Basophils Absolute: 0 10*3/uL (ref 0.0–0.1)
Eosinophils Absolute: 0.1 10*3/uL (ref 0.0–0.7)
Eosinophils Relative: 4 % (ref 0–5)
HEMATOCRIT: 28.8 % — AB (ref 39.0–52.0)
Hemoglobin: 9.7 g/dL — ABNORMAL LOW (ref 13.0–17.0)
Lymphocytes Relative: 16 % (ref 12–46)
Lymphs Abs: 0.4 10*3/uL — ABNORMAL LOW (ref 0.7–4.0)
MCH: 32.4 pg (ref 26.0–34.0)
MCHC: 33.7 g/dL (ref 30.0–36.0)
MCV: 96.3 fL (ref 78.0–100.0)
MONO ABS: 0.2 10*3/uL (ref 0.1–1.0)
MONOS PCT: 9 % (ref 3–12)
NEUTROS ABS: 1.7 10*3/uL (ref 1.7–7.7)
Neutrophils Relative %: 71 % (ref 43–77)
Platelets: 127 10*3/uL — ABNORMAL LOW (ref 150–400)
RBC: 2.99 MIL/uL — ABNORMAL LOW (ref 4.22–5.81)
RDW: 17.5 % — ABNORMAL HIGH (ref 11.5–15.5)
WBC: 2.4 10*3/uL — ABNORMAL LOW (ref 4.0–10.5)

## 2014-08-04 LAB — COMPREHENSIVE METABOLIC PANEL
ALBUMIN: 2.9 g/dL — AB (ref 3.5–5.2)
ALT: 12 U/L (ref 0–53)
ANION GAP: 10 (ref 5–15)
AST: 18 U/L (ref 0–37)
Alkaline Phosphatase: 52 U/L (ref 39–117)
BUN: 23 mg/dL (ref 6–23)
CHLORIDE: 104 meq/L (ref 96–112)
CO2: 26 mEq/L (ref 19–32)
Calcium: 8.2 mg/dL — ABNORMAL LOW (ref 8.4–10.5)
Creatinine, Ser: 1.16 mg/dL (ref 0.50–1.35)
GFR calc Af Amer: 64 mL/min — ABNORMAL LOW (ref >90)
GFR, EST NON AFRICAN AMERICAN: 56 mL/min — AB (ref >90)
GLUCOSE: 91 mg/dL (ref 70–99)
POTASSIUM: 4.3 meq/L (ref 3.7–5.3)
Sodium: 140 mEq/L (ref 137–147)
Total Bilirubin: 1.9 mg/dL — ABNORMAL HIGH (ref 0.3–1.2)
Total Protein: 5.6 g/dL — ABNORMAL LOW (ref 6.0–8.3)

## 2014-08-04 LAB — BILIRUBIN, FRACTIONATED(TOT/DIR/INDIR)
BILIRUBIN DIRECT: 0.7 mg/dL — AB (ref 0.0–0.3)
Indirect Bilirubin: 1.2 mg/dL — ABNORMAL HIGH (ref 0.3–0.9)
Total Bilirubin: 1.9 mg/dL — ABNORMAL HIGH (ref 0.3–1.2)

## 2014-08-04 LAB — PROTIME-INR
INR: 1.34 (ref 0.00–1.49)
PROTHROMBIN TIME: 16.7 s — AB (ref 11.6–15.2)

## 2014-08-04 LAB — LACTATE DEHYDROGENASE: LDH: 371 U/L — ABNORMAL HIGH (ref 94–250)

## 2014-08-04 MED ORDER — LORAZEPAM 2 MG/ML IJ SOLN
2.0000 mg | Freq: Once | INTRAMUSCULAR | Status: AC
  Administered 2014-08-04: 2 mg via INTRAVENOUS
  Filled 2014-08-04: qty 1

## 2014-08-04 MED ORDER — MEPERIDINE HCL 100 MG/ML IJ SOLN
INTRAMUSCULAR | Status: AC
  Filled 2014-08-04: qty 2

## 2014-08-04 MED ORDER — MIDAZOLAM HCL 5 MG/5ML IJ SOLN
INTRAMUSCULAR | Status: AC
  Filled 2014-08-04: qty 10

## 2014-08-04 MED ORDER — POLYETHYLENE GLYCOL 3350 17 G PO PACK
17.0000 g | PACK | ORAL | Status: AC
  Administered 2014-08-04 (×6): 17 g via ORAL
  Filled 2014-08-04 (×6): qty 1

## 2014-08-04 MED ORDER — BISACODYL 5 MG PO TBEC
10.0000 mg | DELAYED_RELEASE_TABLET | ORAL | Status: AC
  Administered 2014-08-04 (×2): 10 mg via ORAL
  Filled 2014-08-04 (×2): qty 2

## 2014-08-04 NOTE — Progress Notes (Signed)
Patient bowel prep was not effective.  Still having solid stools.  Dr. Oneida Alar notified.  Colonoscopy cancelled for today to be done tomorrow.  Rosalyn Gess RN, BSN, CNOR, [employer]

## 2014-08-04 NOTE — Progress Notes (Signed)
[name] JYN:[id number] DOB: 1926/02/06 DOA: 08/02/2014 PCP: [name], MD  Brief narrative:  80 y/o ? Persistent A.Fib CHad2Vasc2 QMVHQ=4-6, Chronic systolic HF + symptomatic bradycardia with PPM placed 12/2008, Pill dysphagia follwed by Dr. Lenon Oms EGD with dilatation 03/28/14, interstitial cystitis s/p bladder stimulator placement, Melanoma 2011 s/p excision, prior falsl with hematomas and need for transfusion 02/2011, chronic pancytopenia, mod MV regurg, chronic pain + consitpation and gout as well as mild dementia-last MMSE 20 out of 30 started on Xarelto 07/05/14 as his A. fib went from paroxysmal to persistent and it was felt that he was a candidate for systemic anticoagulation-Further plans were for him to start amiodarone 200 daily and consider outpatient cardioversion He presented with overall weakness and dark stools in setting of continued NSAID use.  Decline colonoscopies in the past-last one 2007 Dr. [name].   Past medical history-As per Problem list Chart reviewed as below- reviewed  Consultants:   GI -fileds  Procedures:  None yet  Antibiotics:  none   Subjective   Confused this morning seeing his brother and his bedroom and another relative. Felt watery drippingfrom the ceiling when none was doing this Has had informed semi-formed stools which are nonbloody which I reviewed personally He is still continuing his bowel prep     Objective    Interim History:   Telemetry:    Objective: Filed Vitals:   08/03/14 0528 08/03/14 1321 08/03/14 2225 08/04/14 0451  BP:  127/77 124/75 122/85  Pulse:  73 71 60  Temp:  97.6 F (36.4 C) 97.9 F (36.6 C) 97.5 F (36.4 C)  TempSrc:  Oral Axillary Oral  Resp:  20 18 16   Height:      Weight: 78.3 kg (172 lb 9.9 oz)     SpO2:  99% 95% 94%    Intake/Output Summary (Last 24 hours) at 08/04/14 1117 Last data filed at 08/04/14 0950  Gross per 24 hour  Intake    480 ml  Output    452 ml  Net     28 ml      Exam:  General: eomi,slightly confused Cardiovascular:  s1 s2 no m/r/g Abdomen: soft, NT/ND, no rebound Skin no LE edema Neuro intact  Data Reviewed: Basic Metabolic Panel:  Recent Labs Lab 08/01/14 1534 08/02/14 0948 08/03/14 0641 08/04/14 0622  NA 144 143 144 140  K 4.0 3.9 3.9 4.3  CL 107 107 106 104  CO2 27 28 28 26   GLUCOSE 87 115* 89 91  BUN 28* 29* 24* 23  CREATININE 1.31 1.40* 1.22 1.16  CALCIUM 8.5 8.4 8.3* 8.2*   Liver Function Tests:  Recent Labs Lab 08/02/14 0948 08/03/14 0641 08/04/14 0622 08/04/14 0758  AST 20 19 18   --   ALT 13 12 12   --   ALKPHOS 53 51 52  --   BILITOT 1.4* 2.1* 1.9* 1.9*  PROT 5.8* 5.7* 5.6*  --   ALBUMIN 3.0* 2.9* 2.9*  --     Recent Labs Lab 08/02/14 0948  LIPASE 26   No results for input(s): AMMONIA in the last 168 hours. CBC:  Recent Labs Lab 08/01/14 1534 08/02/14 0948 08/03/14 0641 08/04/14 0622  WBC 2.7* 1.9* 2.4* 2.4*  NEUTROABS  --  1.4*  --  1.7  HGB 8.3* 7.6* 9.4* 9.7*  HCT 24.6* 23.0* 28.2* 28.8*  MCV 98.8 98.7 95.6 96.3  PLT 124* 103* 115* 127*   Cardiac Enzymes: No results for input(s): CKTOTAL, CKMB, CKMBINDEX, TROPONINI in  the last 168 hours. BNP: Invalid input(s): POCBNP CBG: No results for input(s): GLUCAP in the last 168 hours.  No results found for this or any previous visit (from the past 240 hour(s)).   Studies:              All Imaging reviewed and is as per above notation   Scheduled Meds: . amiodarone  200 mg Oral Daily  . amitriptyline  10 mg Oral QHS  . [START ON 08/06/2014] amLODipine  2.5 mg Oral Daily  . bisacodyl  10 mg Oral Q2H  . donepezil  5 mg Oral QHS  . dorzolamide-timolol  1 drop Both Eyes BID  . fentaNYL  25 mcg Transdermal Q72H  . latanoprost  1 drop Both Eyes QHS  . meperidine      . midazolam      . pantoprazole  40 mg Oral BID AC  . polyethylene glycol  17 g Oral Q1 Hr x 6  . sodium chloride  3 mL Intravenous Q12H  . tamsulosin  0.4 mg Oral QPC  breakfast  . traZODone  25 mg Oral QHS   Continuous Infusions: . sodium chloride 50 mL/hr at 08/03/14 2210     Assessment/Plan:  1. Possible UGIB-per GI-endoscope in am.  Hb stabilized to 9.7 up from 9.6 after I 1U PRBCon admission 10/30.  Monitor cbc am-Cont Protonix 40 bid as PO 2. Pancytopenia-further OP eval- Myelodysplasia documented in [facility] records 3. Mild decompensated AECHF, Systolic-Lasix  from home dose 20 prn to 20 daily, repeat 4. Interstitial cystitis-continue Flomax 0.4 and stimulator 5. Pre-renal azotemia 2/2 to UGIB-bun/creat 28/1.31-->24/1.2.   6. Hyperbilirubinemia-unclear etiology? Dyscrasia because of GI bleed?previously alkaline phosphatase was normal in 2014+ Transaminases nl so unlikely hepatic origin.  LDH is 371, haptoglobin is pending, indirect bilirubin is 1.2 and the.9. I suspect this will need to be followed as an outpatient with his hematologist at [facility] diagnosis extravascular versus intravascular hemolysis or even mild heart failure, await haptoglobin 7. Afib rate controlled-perisistent-Now off Xarelto-cont Amiodarone 200 qd 8. Bradycardia s/p PPM 2010 9. dementia-mild-Cont aricept 5 qd-slight risk of GI bleed with this class of meds 10. htn-continue Amlodipine 2.5 daily    Code Status: full Family Communication:  No family at bedside Disposition Plan:     Verneita Griffes, MD  [facility] Pager [phone] 08/04/2014, 11:17 AM    LOS: 2 days

## 2014-08-04 NOTE — Progress Notes (Signed)
Patient ID: [name], male   DOB: [birth year], 80 y.o.   MRN: [id number]   Assessment/Plan: Pt failed to clear with prep. PT HAS KNOWN HISTORY OF PANCYTOPENIA. LARGE HEMATOMA OM LEFT LEFT AFTER FALL WHILE TAKING XARELTO. NOTED TO BE HEME POSITIVE.  PLAN: 1. Continue MIRALAX/DULCOLAX PREP 2. TAP WATER ENEMAS NOV 2 3. TCS NOV 2 AROUND 1300.  PT NEEDs PHENERGAN IN PREOP PRIOR TO TCS.  DISCUSSED PROCEDURE WITH PT'S WIFE   Subjective: Since I last evaluated the patient HE ATTEMPTED TO PREP FOR TCS. STILL HAVING FORMED STOOL THIS AM. NOTED TO HAVE INTERMITTENT DELUSIONS.  Objective: Vital signs in last 24 hours: Filed Vitals:   08/04/14 0451  BP: 122/85  Pulse: 60  Temp: 97.5 F (36.4 C)  Resp: 16     General appearance: alert, cooperative and no distress Resp: clear to auscultation bilaterally Cardio: regular rate and IRREGULAR rhythm GI: soft, non-tender; bowel sounds normal; no masses,  no organomegaly  Lab Results: Cr 1.16 T BILI 1.9(i BILI 1.2), Hb 9.7  Studies/Results: No results found.  Medications: I have reviewed the patient's current medications.   LOS: 5 days   Barney Drain 03/14/2014, 2:23 PM

## 2014-08-05 ENCOUNTER — Encounter (HOSPITAL_COMMUNITY)
Admission: EM | Disposition: A | Discharge status: Skilled Nursing Facility | Payer: Self-pay | Source: Home / Self Care | Attending: Family Medicine

## 2014-08-05 ENCOUNTER — Encounter (HOSPITAL_COMMUNITY): Payer: Self-pay | Admitting: *Deleted

## 2014-08-05 ENCOUNTER — Inpatient Hospital Stay (HOSPITAL_COMMUNITY): Payer: Medicare Other

## 2014-08-05 HISTORY — PX: COLONOSCOPY: SHX5424

## 2014-08-05 LAB — HAPTOGLOBIN: Haptoglobin: <25 mg/dL — ABNORMAL LOW (ref 45–215)

## 2014-08-05 SURGERY — COLONOSCOPY
Anesthesia: Moderate Sedation

## 2014-08-05 MED ORDER — MIDAZOLAM HCL 5 MG/5ML IJ SOLN
INTRAMUSCULAR | Status: DC | PRN
  Administered 2014-08-05 (×2): 2 mg via INTRAVENOUS

## 2014-08-05 MED ORDER — SIMETHICONE 40 MG/0.6ML PO SUSP
ORAL | Status: DC | PRN
  Administered 2014-08-05: 13:00:00

## 2014-08-05 MED ORDER — MIDAZOLAM HCL 5 MG/5ML IJ SOLN
INTRAMUSCULAR | Status: AC
  Filled 2014-08-05: qty 10

## 2014-08-05 MED ORDER — SODIUM CHLORIDE 0.9 % IV SOLN
INTRAVENOUS | Status: DC
Start: 2014-08-05 — End: 2014-08-05
  Administered 2014-08-05: 13:00:00 via INTRAVENOUS

## 2014-08-05 MED ORDER — FENTANYL 12 MCG/HR TD PT72
12.5000 ug | MEDICATED_PATCH | TRANSDERMAL | Status: DC
  Administered 2014-08-06: 12.5 ug via TRANSDERMAL
  Filled 2014-08-05: qty 1

## 2014-08-05 MED ORDER — MEPERIDINE HCL 100 MG/ML IJ SOLN
INTRAMUSCULAR | Status: AC
  Filled 2014-08-05: qty 2

## 2014-08-05 MED ORDER — MEPERIDINE HCL 100 MG/ML IJ SOLN
INTRAMUSCULAR | Status: DC | PRN
  Administered 2014-08-05 (×2): 25 mg via INTRAVENOUS

## 2014-08-05 NOTE — H&P (View-Only) (Signed)
Patient ID: [name], male   DOB: 1925-10-28, 80 y.o.   MRN: [id number]   Assessment/Plan: Pt failed to clear with prep. PT HAS KNOWN HISTORY OF PANCYTOPENIA. LARGE HEMATOMA OM LEFT LEFT AFTER FALL WHILE TAKING XARELTO. NOTED TO BE HEME POSITIVE.  PLAN: 1. Continue MIRALAX/DULCOLAX PREP 2. TAP WATER ENEMAS NOV 2 3. TCS NOV 2 AROUND 1300.  PT NEEDs PHENERGAN IN PREOP PRIOR TO TCS.  DISCUSSED PROCEDURE WITH PT'S WIFE   Subjective: Since I last evaluated the patient HE ATTEMPTED TO PREP FOR TCS. STILL HAVING FORMED STOOL THIS AM. NOTED TO HAVE INTERMITTENT DELUSIONS.  Objective: Vital signs in last 24 hours: Filed Vitals:   08/04/14 0451  BP: 122/85  Pulse: 60  Temp: 97.5 F (36.4 C)  Resp: 16     General appearance: alert, cooperative and no distress Resp: clear to auscultation bilaterally Cardio: regular rate and IRREGULAR rhythm GI: soft, non-tender; bowel sounds normal; no masses,  no organomegaly  Lab Results: Cr 1.16 T BILI 1.9(i BILI 1.2), Hb 9.7  Studies/Results: No results found.  Medications: I have reviewed the patient's current medications.   LOS: 5 days   Barney Drain 03/14/2014, 2:23 PM

## 2014-08-05 NOTE — Interval H&P Note (Signed)
History and Physical Interval Note:  08/05/2014 12:52 PM  [name]  has presented today for surgery, with the diagnosis of heme positive stools, anemia  The various methods of treatment have been discussed with the patient and family. After consideration of risks, benefits and other options for treatment, the patient has consented to  Procedure(s) with comments: COLONOSCOPY (N/A) - PT NEEDS TCS AT 1000. as a surgical intervention .  The patient's history has been reviewed, patient examined, no change in status, stable for surgery.  I have reviewed the patient's chart and labs.  Questions were answered to the patient's satisfaction.     [employer]

## 2014-08-05 NOTE — Progress Notes (Signed)
UR chart review completed.  

## 2014-08-05 NOTE — Op Note (Signed)
[facility] [address] [place], [address]   COLONOSCOPY PROCEDURE REPORT  PATIENT: [name], [name]  MR#: [id number] BIRTHDATE: 1925-10-21 , 25  yrs. old GENDER: male ENDOSCOPIST: Barney Drain, MD REFERRED YP:[id number] Moshe Cipro, M.D.  Garfield Cornea, M.D. PROCEDURE DATE:  2014/08/12 PROCEDURE:   Colonoscopy, diagnostic INDICATIONS:anemia iron with pancytopenia & a large hematoma on left extremity after falling while taking Xarelto. PT IS NOW CONFUSED AFTER PHENRGAN x1 ONOV 1 AND ATIVAN 2 MG IV x1. MEDICATIONS: Demerol 50 mg IV and Versed 5 mg IV  DESCRIPTION OF PROCEDURE:    Physical exam was performed.  Informed consent was obtained from the patient after explaining the benefits, risks, and alternatives to procedure.  The patient was connected to monitor and placed in left lateral position. Continuous oxygen was provided by nasal cannula and IV medicine administered through an indwelling cannula.  After administration of sedation and rectal exam, the patients rectum was intubated and the EC-[id number] ([id number])  colonoscope was advanced under direct visualization to the cecum.  The scope was removed slowly by carefully examining the color, texture, anatomy, and integrity mucosa on the way out.  The patient was recovered in endoscopy and discharged home in satisfactory condition.    COLON FINDINGS: INCOMPLETE COLONOSCOPY TO THE DISTAL ASCENDING COLON.  NO MASS APPRECTIATED.  NO OLD OR FRESH BLOOD SEEN.  POLYPS < 1 CM WOUL DHAVE BEEN EASILY MISSED DUE TO FAIR BOWEL PREP. EXTREMELY REDUBNDNAT COLON.  UNABLE TO REACH CECUM WITH CHANGE IN POSITION, PRESSEURE, AND THE ENTRADA COLONIC OVERTUBE.  PREP QUALITY: inadequate. CECAL W/D TIME:        COMPLICATIONS: None  ENDOSCOPIC IMPRESSION: ANEMIA DUE TO PANCYTOPENIA AND HEMATOMA  RECOMMENDATIONS: DYSPHAGIA 2 DIET STOP FENTANYL PATCH CONITNUE ELAVIL/DESYREL. IF PT DEVELOPS A TRANSFUSION DEPENDENT ANEMIA, PT WOUL DLIKELY  NEED TCS AT [facility] WITH FLURO/OVERTUBE/2 DAY TRILYTE PREP.      _______________________________ Lorrin MaisBarney Drain, MD 08/12/2014 2:27 PM    CPT CODES: ICD CODES:  The ICD and CPT codes recommended by this software are interpretations from the data that the clinical staff has captured with the software.  The verification of the translation of this report to the ICD and CPT codes and modifiers is the sole responsibility of the health care institution and practicing physician where this report was generated.  [employer] will not be held responsible for the validity of the ICD and CPT codes included on this report.  AMA assumes no liability for data contained or not contained herein. CPT is a Designer, television/film set of the [employer].

## 2014-08-05 NOTE — Plan of Care (Signed)
Problem: Phase I Progression Outcomes Goal: Hemodynamically stable Outcome: Completed/Met Date Met:  08/05/14

## 2014-08-05 NOTE — Progress Notes (Signed)
[name] HXT:[id number] DOB: [birth year] DOA: 08/02/2014 PCP: [name], MD  Brief narrative:  80 y/o ? Persistent A.Fib CHad2Vasc2 XKPVV=7-4, Chronic systolic HF + symptomatic bradycardia with PPM placed 12/2008, Pill dysphagia follwed by Dr. Lenon Oms EGD with dilatation 03/28/14, interstitial cystitis s/p bladder stimulator placement, Melanoma 2011 s/p excision, prior falsl with hematomas and need for transfusion 02/2011, chronic pancytopenia, mod MV regurg, chronic pain + consitpation and gout as well as mild dementia-last MMSE 20 out of 30 started on Xarelto 07/05/14 as his A. fib went from paroxysmal to persistent and it was felt that he was a candidate for systemic anticoagulation-Further plans were for him to start amiodarone 200 daily and consider outpatient cardioversion He presented with overall weakness and dark stools in setting of continued NSAID use.  Decline colonoscopies in the past-last one 2007 Dr. [name].   Past medical history-As per Problem list Chart reviewed as below- reviewed  Consultants:   GI -fileds  Procedures:  None yet  Antibiotics:  none   Subjective   Confusion persists.  Worse in the pm, with sundowning Trying to get OOB and needed to be restrained No meaningful ROS can be obtained    Objective    Interim History:   Telemetry:    Objective: Filed Vitals:   08/05/14 1305 08/05/14 1310 08/05/14 1315 08/05/14 1320  BP: 120/59 91/59 73/48  80/53  Pulse: 63 60 67 69  Temp:      TempSrc:      Resp: 19 11 13 15   Height:      Weight:      SpO2: 99% 100% 97% 96%    Intake/Output Summary (Last 24 hours) at 08/05/14 1322 Last data filed at 08/04/14 1805  Gross per 24 hour  Intake     60 ml  Output    400 ml  Net   -340 ml    Exam:  General: eomi,slightly confused Cardiovascular:  s1 s2 no m/r/g Abdomen: soft, NT/ND, no rebound Skin no LE edema Neuro intact  Data Reviewed: Basic Metabolic Panel:  Recent Labs Lab  08/01/14 1534 08/02/14 0948 08/03/14 0641 08/04/14 0622  NA 144 143 144 140  K 4.0 3.9 3.9 4.3  CL 107 107 106 104  CO2 27 28 28 26   GLUCOSE 87 115* 89 91  BUN 28* 29* 24* 23  CREATININE 1.31 1.40* 1.22 1.16  CALCIUM 8.5 8.4 8.3* 8.2*   Liver Function Tests:  Recent Labs Lab 08/02/14 0948 08/03/14 0641 08/04/14 0622 08/04/14 0758  AST 20 19 18   --   ALT 13 12 12   --   ALKPHOS 53 51 52  --   BILITOT 1.4* 2.1* 1.9* 1.9*  PROT 5.8* 5.7* 5.6*  --   ALBUMIN 3.0* 2.9* 2.9*  --     Recent Labs Lab 08/02/14 0948  LIPASE 26   No results for input(s): AMMONIA in the last 168 hours. CBC:  Recent Labs Lab 08/01/14 1534 08/02/14 0948 08/03/14 0641 08/04/14 0622  WBC 2.7* 1.9* 2.4* 2.4*  NEUTROABS  --  1.4*  --  1.7  HGB 8.3* 7.6* 9.4* 9.7*  HCT 24.6* 23.0* 28.2* 28.8*  MCV 98.8 98.7 95.6 96.3  PLT 124* 103* 115* 127*   Cardiac Enzymes: No results for input(s): CKTOTAL, CKMB, CKMBINDEX, TROPONINI in the last 168 hours. BNP: Invalid input(s): POCBNP CBG: No results for input(s): GLUCAP in the last 168 hours.  No results found for this or any previous visit (from the past 240 hour(s)).  Studies:              All Imaging reviewed and is as per above notation   Scheduled Meds: . [MAR Hold] amiodarone  200 mg Oral Daily  . [MAR Hold] amitriptyline  10 mg Oral QHS  . [MAR Hold] amLODipine  2.5 mg Oral Daily  . [MAR Hold] donepezil  5 mg Oral QHS  . [MAR Hold] dorzolamide-timolol  1 drop Both Eyes BID  . [MAR Hold] fentaNYL  25 mcg Transdermal Q72H  . [MAR Hold] latanoprost  1 drop Both Eyes QHS  . meperidine      . midazolam      . [MAR Hold] pantoprazole  40 mg Oral BID AC  . [MAR Hold] sodium chloride  3 mL Intravenous Q12H  . [MAR Hold] tamsulosin  0.4 mg Oral QPC breakfast  . [MAR Hold] traZODone  25 mg Oral QHS   Continuous Infusions: . sodium chloride 20 mL/hr at 08/05/14 1237  . sodium chloride 50 mL/hr at 08/03/14 2210      Assessment/Plan:  1. Possible UGIB-per GI-endoscope in am.  Hb stabilized to 9.7 up from 9.6 after I 1U PRBC on admission 10/30.  Will continue Protonix.  Would get CBC in am 2. Pancytopenia-further OP eval- Myelodysplasia documented in [facility] records-consider OP eval although with significant dementia not sure if work-up or therapy warranted 3. Mild decompensated AECHF, Systolic-Lasix  from home dose 20 prn to 20 daily.  Currently -1.58 liters for hospital stay 4. Interstitial cystitis-continue Flomax 0.4 and stimulator 5. Pre-renal azotemia 2/2 to UGIB-bun/creat 28/1.31-->24/1.2.   6. Hyperbilirubinemia-unclear etiology? Dyscrasia because of GI bleed?previously alkaline phosphatase was normal in 2014+ Transaminases nl so unlikely hepatic origin.  LDH is 371, haptoglobin is pending, indirect bilirubin is 1.2 and the total is 1.9. suspect this will need to be followed as an outpatient with his hematologist at [facility] diagnosis extravascular versus intravascular hemolysis or even mild heart failure, await haptoglobin 7. Afib rate controlled-perisistent-Now off Xarelto-cont Amiodarone 200 qd 8. Bradycardia s/p PPM 2010 9. dementia-mild-Cont aricept 5 qd-could have risk GI bleed with this class of meds 10. htn-continue Amlodipine 2.5 daily, controlled    Code Status: full Family Communication: called granddaughter-no asnwerinpatient pedning results colonoscopy-needs OP referral Onc  Disposition Plan:     Verneita Griffes, MD  [facility] Pager [phone] 08/05/2014, 1:22 PM    LOS: 3 days

## 2014-08-06 ENCOUNTER — Telehealth: Payer: Self-pay | Admitting: Family Medicine

## 2014-08-06 ENCOUNTER — Inpatient Hospital Stay
Admission: RE | Admit: 2014-08-06 | Discharge: 2014-08-26 | Disposition: A | Discharge status: Home / Self Care | Payer: Medicare Other | Source: Ambulatory Visit | Attending: Internal Medicine | Admitting: Internal Medicine

## 2014-08-06 DIAGNOSIS — M25561 Pain in right knee: Secondary | ICD-10-CM | POA: Diagnosis not present

## 2014-08-06 DIAGNOSIS — K922 Gastrointestinal hemorrhage, unspecified: Secondary | ICD-10-CM | POA: Diagnosis not present

## 2014-08-06 DIAGNOSIS — N3010 Interstitial cystitis (chronic) without hematuria: Secondary | ICD-10-CM | POA: Diagnosis not present

## 2014-08-06 DIAGNOSIS — I5022 Chronic systolic (congestive) heart failure: Secondary | ICD-10-CM | POA: Diagnosis not present

## 2014-08-06 DIAGNOSIS — I517 Cardiomegaly: Secondary | ICD-10-CM | POA: Diagnosis not present

## 2014-08-06 DIAGNOSIS — M25521 Pain in right elbow: Secondary | ICD-10-CM | POA: Diagnosis not present

## 2014-08-06 DIAGNOSIS — D61818 Other pancytopenia: Secondary | ICD-10-CM | POA: Diagnosis not present

## 2014-08-06 DIAGNOSIS — I080 Rheumatic disorders of both mitral and aortic valves: Secondary | ICD-10-CM | POA: Diagnosis not present

## 2014-08-06 DIAGNOSIS — R600 Localized edema: Secondary | ICD-10-CM | POA: Diagnosis not present

## 2014-08-06 DIAGNOSIS — R918 Other nonspecific abnormal finding of lung field: Secondary | ICD-10-CM | POA: Diagnosis not present

## 2014-08-06 DIAGNOSIS — R05 Cough: Secondary | ICD-10-CM | POA: Diagnosis not present

## 2014-08-06 DIAGNOSIS — R0689 Other abnormalities of breathing: Secondary | ICD-10-CM | POA: Diagnosis not present

## 2014-08-06 DIAGNOSIS — M79642 Pain in left hand: Secondary | ICD-10-CM | POA: Diagnosis not present

## 2014-08-06 DIAGNOSIS — R278 Other lack of coordination: Secondary | ICD-10-CM | POA: Diagnosis not present

## 2014-08-06 DIAGNOSIS — Z960 Presence of urogenital implants: Secondary | ICD-10-CM | POA: Diagnosis not present

## 2014-08-06 DIAGNOSIS — R488 Other symbolic dysfunctions: Secondary | ICD-10-CM | POA: Diagnosis not present

## 2014-08-06 DIAGNOSIS — R29898 Other symptoms and signs involving the musculoskeletal system: Secondary | ICD-10-CM | POA: Diagnosis not present

## 2014-08-06 DIAGNOSIS — I10 Essential (primary) hypertension: Secondary | ICD-10-CM | POA: Diagnosis not present

## 2014-08-06 DIAGNOSIS — M7989 Other specified soft tissue disorders: Secondary | ICD-10-CM | POA: Diagnosis not present

## 2014-08-06 DIAGNOSIS — E870 Hyperosmolality and hypernatremia: Secondary | ICD-10-CM | POA: Diagnosis not present

## 2014-08-06 DIAGNOSIS — M79631 Pain in right forearm: Secondary | ICD-10-CM | POA: Diagnosis not present

## 2014-08-06 DIAGNOSIS — R262 Difficulty in walking, not elsewhere classified: Secondary | ICD-10-CM | POA: Diagnosis not present

## 2014-08-06 DIAGNOSIS — F0280 Dementia in other diseases classified elsewhere without behavioral disturbance: Secondary | ICD-10-CM | POA: Diagnosis not present

## 2014-08-06 DIAGNOSIS — H6123 Impacted cerumen, bilateral: Secondary | ICD-10-CM | POA: Diagnosis not present

## 2014-08-06 DIAGNOSIS — Z950 Presence of cardiac pacemaker: Secondary | ICD-10-CM | POA: Diagnosis not present

## 2014-08-06 DIAGNOSIS — R059 Cough, unspecified: Principal | ICD-10-CM

## 2014-08-06 DIAGNOSIS — I5020 Unspecified systolic (congestive) heart failure: Secondary | ICD-10-CM | POA: Diagnosis not present

## 2014-08-06 DIAGNOSIS — N289 Disorder of kidney and ureter, unspecified: Secondary | ICD-10-CM | POA: Diagnosis not present

## 2014-08-06 DIAGNOSIS — I481 Persistent atrial fibrillation: Secondary | ICD-10-CM | POA: Diagnosis not present

## 2014-08-06 DIAGNOSIS — M6281 Muscle weakness (generalized): Secondary | ICD-10-CM | POA: Diagnosis not present

## 2014-08-06 DIAGNOSIS — M25562 Pain in left knee: Secondary | ICD-10-CM | POA: Diagnosis not present

## 2014-08-06 DIAGNOSIS — Z8781 Personal history of (healed) traumatic fracture: Secondary | ICD-10-CM | POA: Diagnosis not present

## 2014-08-06 DIAGNOSIS — G309 Alzheimer's disease, unspecified: Secondary | ICD-10-CM | POA: Diagnosis not present

## 2014-08-06 DIAGNOSIS — R0989 Other specified symptoms and signs involving the circulatory and respiratory systems: Secondary | ICD-10-CM | POA: Diagnosis not present

## 2014-08-06 DIAGNOSIS — R531 Weakness: Secondary | ICD-10-CM | POA: Diagnosis not present

## 2014-08-06 DIAGNOSIS — G4700 Insomnia, unspecified: Secondary | ICD-10-CM | POA: Diagnosis not present

## 2014-08-06 DIAGNOSIS — Z9181 History of falling: Secondary | ICD-10-CM | POA: Diagnosis not present

## 2014-08-06 DIAGNOSIS — R609 Edema, unspecified: Secondary | ICD-10-CM

## 2014-08-06 MED ORDER — FENTANYL 12 MCG/HR TD PT72: 12.5000 ug | MEDICATED_PATCH | TRANSDERMAL | Status: DC

## 2014-08-06 MED ORDER — TRAMADOL HCL 50 MG PO TABS: 50.0000 mg | ORAL_TABLET | ORAL | Status: DC | PRN

## 2014-08-06 MED ORDER — PANTOPRAZOLE SODIUM 40 MG PO TBEC: 40.0000 mg | DELAYED_RELEASE_TABLET | Freq: Two times a day (BID) | ORAL | Status: DC

## 2014-08-06 NOTE — Clinical Social Work Placement (Signed)
Clinical Social Work Department CLINICAL SOCIAL WORK PLACEMENT NOTE 08/06/2014  Patient:  [name], [name]  Account Number:  [id number] Admit date:  08/02/2014  Clinical Social Worker:  Benay Pike, LCSW  Date/time:  08/06/2014 12:09 PM  Clinical Social Work is seeking post-discharge placement for this patient at the following level of care:   [place]   (*CSW will update this form in Epic as items are completed)   08/06/2014  Patient/family provided with [facility] Department of Clinical Social Work's list of facilities offering this level of care within the geographic area requested by the patient (or if unable, by the patient's family).  08/06/2014  Patient/family informed of their freedom to choose among providers that offer the needed level of care, that participate in Medicare, Medicaid or managed care program needed by the patient, have an available bed and are willing to accept the patient.  08/06/2014  Patient/family informed of [facility]' ownership interest in [facility], as well as of the fact that they are under no obligation to receive care at this facility.  PASARR submitted to EDS on 08/06/2014 PASARR number received on 08/06/2014  FL2 transmitted to all facilities in geographic area requested by pt/family on  08/06/2014 FL2 transmitted to all facilities within larger geographic area on   Patient informed that his/her managed care company has contracts with or will negotiate with  certain facilities, including the following:     Patient/family informed of bed offers received:   Patient chooses bed at  Physician recommends and patient chooses bed at    Patient to be transferred to  on   Patient to be transferred to facility by  Patient and family notified of transfer on  Name of family member notified:    The following physician request were entered in Epic:   Additional Comments:  Benay Pike, [place]

## 2014-08-06 NOTE — Clinical Social Work Note (Signed)
CSW presented bed offers and pt's wife chooses [facility]. Facility notified. D/C summary faxed. Pt to transfer with RN.  Benay Pike, [place]

## 2014-08-06 NOTE — Evaluation (Signed)
Physical Therapy Evaluation Patient Details Name: [name] MRN: [id number] DOB: September 29, 1926 Today's Date: 08/06/2014   History of Present Illness  80 y/o ? Persistent A.Fib CHad2Vasc2 RJJOA=4-1, Chronic systolic HF + symptomatic bradycardia with PPM placed 12/2008, Pill dysphagia follwed by Dr. [name] had EGD with dilatation 03/28/14, interstitial cystitis s/p bladder stimulator placement, Melanoma 2011 s/p excision, prior falsl with hematomas and need for transfusion 02/2011, chronic pancytopenia, mod MV regurg, chronic pain + consitpation and gout as well as mild dementia-last MMSE 20 out of 30.  He is a poor historian and his wife know some issues but confabulates and is not sure if multiple parts of his medical history. Essentially based on medical records he is had a couple of falls over the past couple of days. This is an background of multiple falls over the past 6-8 months which is being worked up. He is noncompliant with use of his cane and walker and his PCPs looking into a wheelchair for him. He went to see his primary care physician 10/29 and had multiple x-rays performed without any evidence of acute fracture however widening of interspace between scaphoid and lunate indicating ligamentous injury and referral already has been placed to Dr. Luna Glasgow of orthopedics.  Patient is noted to be on ibuprofen multiple other over-the-counter medications for chronic low back pain .  Pt underwent colonoscopy on 08/05/14.    Clinical Impression  Pt is an 80 year old male who presents to PT with dx of acute upper GI bleed, who underwent colonoscopy on 08/05/14.  Pt does have a hx of dementia, and wife present at end of evaluation for hx.  During evaluation, pt required   supervision for supine -> sit and min/mod assist for sit -> supine, and min/mod assist for transfers with use of RW.  Pt reports he normally uses a std cane, though pt required increased assistance to day with physical assist and RW to complete  transfers.  Pt unable to clear feet to attempt amb secondary to fatigue/weakness.  Recommend continued PT while in the hospital to address strengthening, activity tolerance, balance for improved functional mobility skills with transition to SNF for continued care.  No DME recommendations at this time, will defer to SNF.   Follow Up Recommendations SNF    Equipment Recommendations  Other (comment) (Will defer to SNF. )       Precautions / Restrictions Precautions Precautions: Fall Restrictions Weight Bearing Restrictions: No      Mobility  Bed Mobility Overal bed mobility: Needs Assistance Bed Mobility: Supine to Sit;Sit to Supine     Supine to sit: Supervision Sit to supine: Mod assist;Min assist (Mod assist for LE, min assist for trunk)      Transfers Overall transfer level: Needs assistance Equipment used: Rolling walker (2 wheeled) Transfers: Sit to/from Stand Sit to Stand: Min assist;Mod assist         General transfer comment: Mod assist on 1st attempt, min assist on 2nd attempt.  Pt only able to maintain standing for ~5-8 seconds prior to returning to sitting secondary to fatigue.   Ambulation/Gait             General Gait Details: Attempted side stepping, though pt unable to weight shift to clear feet secondary to fatigue.       Balance Overall balance assessment: Needs assistance Sitting-balance support: Feet supported;No upper extremity supported Sitting balance-Leahy Scale: Good     Standing balance support: Bilateral upper extremity supported;During functional activity Standing balance-Leahy Scale:  Poor                               Pertinent Vitals/Pain Pain Assessment: Faces Faces Pain Scale: Hurts little more (No complaints of pain when lying in bed, complaints of pain after transfers to sit/stand) Pain Location: Lt side Pain Intervention(s): Limited activity within patient's tolerance;Repositioned    Home Living  Family/patient expects to be discharged to:: Skilled nursing facility Living Arrangements: Spouse/significant other               Additional Comments: Pt lives in a home with his wife with 1 step to enter the door.     Prior Function Level of Independence: Independent with assistive device(s)         Comments: Per pt, he is mod (I) with bed mobility skills, transfers, and household amb with std cane.      Hand Dominance   Dominant Hand: Right    Extremity/Trunk Assessment               Lower Extremity Assessment: Generalized weakness         Communication   Communication: No difficulties  Cognition Arousal/Alertness: Lethargic Behavior During Therapy: WFL for tasks assessed/performed Overall Cognitive Status: History of cognitive impairments - at baseline                       Assessment/Plan    PT Assessment Patient needs continued PT services  PT Diagnosis Difficulty walking;Generalized weakness   PT Problem List Decreased strength;Decreased activity tolerance;Decreased balance;Decreased mobility  PT Treatment Interventions Gait training;Balance training;Functional mobility training;Therapeutic activities;Therapeutic exercise;DME instruction   PT Goals (Current goals can be found in the Care Plan section) Acute Rehab PT Goals Patient Stated Goal: go home PT Goal Formulation: With patient Time For Goal Achievement: 08/20/14 Potential to Achieve Goals: Good    Frequency Min 3X/week   Barriers to discharge Inaccessible home environment;Decreased caregiver support         End of Session Equipment Utilized During Treatment: Gait belt Activity Tolerance: Patient limited by fatigue Patient left: in bed;with call bell/phone within reach;with bed alarm set           Time: 1740-8144 PT Time Calculation (min): 17 min   Charges:   PT Evaluation $Initial PT Evaluation Tier I: 1 Procedure     [name] 08/06/2014, 10:31  AM

## 2014-08-06 NOTE — Clinical Social Work Psychosocial (Signed)
Clinical Social Work Department BRIEF PSYCHOSOCIAL ASSESSMENT 08/06/2014  Patient:  [name], [name]     Account Number:  [id number]     Admit date:  08/02/2014  Clinical Social Worker:  Wyatt Haste  Date/Time:  08/06/2014 12:11 PM  Referred by:  CSW  Date Referred:  08/06/2014 Referred for  SNF Placement   Other Referral:   Interview type:  Patient Other interview type:   wife- [name]    PSYCHOSOCIAL DATA Living Status:  WIFE Admitted from facility:   Level of care:   Primary support name:  [name] Primary support relationship to patient:  SPOUSE Degree of support available:   supportive    CURRENT CONCERNS Current Concerns  Post-Acute Placement   Other Concerns:    SOCIAL WORK ASSESSMENT / PLAN CSW met with pt and pt's wife at bedside. Pt d/c today and saw PT prior to d/c. PT recommending SNF. Pt participated in some parts of assessment, but closed eyes and had wife answer most questions. Pt has diagnosis of dementia. Pt lives with wife and typically does okay at home. His wife states that he has been becoming weaker at home recently and falling a lot. Pt generally ambulates with a cane or a walker and is fairly independent with ADLs. The couple have been married 18 years. He has 2 sons and she has 2 daughters. Roselyn lives a few miles away and is best support, checking in frequently. Pt's wife is concerned about meeting pt's needs at home due to his current weakness. CSW discussed SNF placement process, including Medicare coverage/criteria. Wife requests [facility] if possible. She states they live in [place] but they do everything here. SNF list provided.   Assessment/plan status:  Psychosocial Support/Ongoing Assessment of Needs Other assessment/ plan:   Information/referral to community resources:   SNF list    PATIENT'S/FAMILY'S RESPONSE TO PLAN OF CARE: Pt and wife agreeable to SNF. Pt has been d/c. CSW will follow up with bed offers.       Benay Pike,  [place]

## 2014-08-06 NOTE — Care Management Note (Signed)
    Page 1 of 1   08/06/2014     11:05:36 AM CARE MANAGEMENT NOTE 08/06/2014  Patient:  [name]   Account Number:  [id number]  Date Initiated:  08/06/2014  Documentation initiated by:  Theophilus Kinds  Subjective/Objective Assessment:   Pt admitted from home with gi bleeding. Pt lives with his wife but PT is recommending SNF at discharge. Pt has a cane and rolling walker at home.     Action/Plan:   CSW is aware and will arrange discharge to facility when bed found.   Anticipated DC Date:  08/06/2014   Anticipated DC Plan:  HOME/SELF CARE  In-house referral  Clinical Social Worker      DC Planning Services  CM consult      Choice offered to / List presented to:             Status of service:  Completed, signed off Medicare Important Message given?  YES (If response is "NO", the following Medicare IM given date fields will be blank) Date Medicare IM given:  08/06/2014 Medicare IM given by:  Theophilus Kinds Date Additional Medicare IM given:   Additional Medicare IM given by:    Discharge Disposition:  [facility]  Per UR Regulation:    If discussed at Long Length of Stay Meetings, dates discussed:    Comments:  08/06/14 [place], RN BSN CM

## 2014-08-06 NOTE — Discharge Summary (Signed)
Physician Discharge Summary  [name] ZOX:[id number] DOB: July 15, 1926 DOA: 08/02/2014  PCP: [name], MD  Admit date: 08/02/2014 Discharge date: 08/06/2014  Time spent: 25 minutes  Recommendations for Outpatient Follow-up:  1. Recommend complete discontinuation of Xarelto as an outpatient and give significant consideration to use of aspirin before starting it is unclear source of GI bleed 2. Recommend goals of care if does notimprove 3. Recommend Outpatient follow-up for [facility]cytopenia at [facility] if needed-patient not felt to be great candidate for workup or treatment of any hematological issue if invasive workup needed 4. Will be discharged when skilled nursing bed available 5. Please repeat CBC as well as complete metabolic panel + LDH haptoglobinlooking specifically at fractionated and unfractionated bilirubin to determine if need for workup of hemolytic anemias needed  Discharge Diagnoses:  Principal Problem:   Acute upper GI bleeding Active Problems:   Pancytopenia   Chronic systolic heart failure   INTERSTITIAL CYSTITIS   Malignant melanoma   Alzheimer's dementia   Lower extremity weakness   Chest pain   Hand pain, left   Discharge Condition: good  Diet recommendation: heart healthy low-salt  Filed Weights   08/02/14 0907 08/02/14 1313 08/03/14 0528  Weight: 77.111 kg (170 lb) 77.111 kg (170 lb) 78.3 kg (172 lb 9.9 oz)    Comorbidities Persistent A.Fib CHad2Vasc2 WJXBJ=4-7, Chronic systolic HF + symptomatic bradycardia with PPM placed 12/2008, Pill dysphagia follwed by Dr. Lenon Oms EGD with dilatation 03/28/14, interstitial cystitis s/p bladder stimulator placement, Melanoma 2011 s/p excision, prior falls with hematomas and need for transfusion 02/2011, chronic pancytopenia, mod MV regurg, chronic pain + consitpation and gout as well as mild dementia-last MMSE 20 out of 30  History of present illness:  80 y/o ? who admitted to [facility] 08/02/14 with  potential lower GI bleed he was started on Xarelto 07/05/14 by his cardiologist as he developed persistent rather than paroxysmal H fibrillation and it was thought that he may benefit from amiodarone use in the near future as well. He presented with overall weakness and dark stools in setting of continued NSAID use.  Decline colonoscopies in the past-last one 2007 Dr. [name].  Hospital Course:   1. Possible UGIB-per GI-endoscope in am. Hb stabilized to 9.7 up from 9.6 after I 1U PRBC on admission 10/30. Will continue Protonix. Would get CBC in am 2. Pancytopenia-further OP eval- Myelodysplasia documented in [facility] records-I do not think he would be a great candidate for further workup 3. Follow-up with hematoma-he is unsafe to ambulate at home and will probably need skilled nursing care based on physical therapy recommendation 4. Mild decompensated AECHF, Systolic-Lasix  from home dose 20 prn to 20 daily.on discharge his ins and outs were almost even with +294. Discharged on home dose 20 daily as opposed to when necessary 5. Interstitial cystitis-continue Flomax 0.4 and stimulator-this will need to be followed up with his urologist 6. Pre-renal azotemia 2/2 to UGIB-bun/creat 28/1.31-->24/1.2.  7. Hyperbilirubinemia-unclear etiology? Dyscrasia because of GI bleed?previously alkaline phosphatase was normal in 2014+ Transaminases nl so unlikely hepatic origin. LDH is 371,  indirect bilirubin is 1.2 and the total is 1.9. suspect this will need to be followed as an outpatient with his hematologist at [facility] diagnosis extravascular versus intravascular hemolysis or even mild heart failure,his haptoglobin was less than 25 so in a setting of elevated LDHhe probably has some hemolysis going on but this is difficult to tell as he also is on an anticoagulant.this could also be due  to his fall which caused a hematoma 8. Afib rate controlled-perisistent-Now off Xarelto-cont Amiodarone 200 qd. He  represents a high risk for bleeding given falls, GI bleed which was uncharacterized this admission and I would not place him on aspirin at this stage. He may be able to be placed on it in 10-14 days but there is still a risk of persistent bleeding again. I suggest a discussion with him and his family as an outpatient in terms of what decision they would want 9. Bradycardia s/p PPM 2010 10. dementia-mild-Cont aricept 5 qd-could have risk GI bleed with this class of meds-defers an outpatient to decisions about its use 11. htn-continue Amlodipine 2.5 daily, controlled  Procedures:   colonoscopy 08/05/14 showed COLON FINDINGS: INCOMPLETE COLONOSCOPY TO THE DISTAL ASCENDING COLON. NO MASS APPRECTIATED. NO OLD OR FRESH BLOOD SEEN. POLYPS < 1 CM WOUL DHAVE BEEN EASILY MISSED DUE TO FAIR BOWEL PREP. EXTREMELY REDUBNDNAT COLON. UNABLE TO REACH CECUM WITH CHANGE IN POSITION, PRESSEURE, AND THE ENTRADA COLONIC OVERTUBE.  Consultations:  gastroenterology  Discharge Exam: Filed Vitals:   08/06/14 0702  BP: 121/80  Pulse: 60  Temp: 97.9 F (36.6 C)  Resp: 20    General: alert pleasant oriented no apparent distress Cardiovascular: S1-S2 no murmur rub or gallop Respiratory: clinically clear  Discharge Instructions You were cared for by a hospitalist during your hospital stay. If you have any questions about your discharge medications or the care you received while you were in the hospital after you are discharged, you can call the unit and asked to speak with the hospitalist on call if the hospitalist that took care of you is not available. Once you are discharged, your primary care physician will handle any further medical issues. Please note that NO REFILLS for any discharge medications will be authorized once you are discharged, as it is imperative that you return to your primary care physician (or establish a relationship with a primary care physician if you do not have one) for your  aftercare needs so that they can reassess your need for medications and monitor your lab values.  Discharge Instructions    Diet - low sodium heart healthy    Complete by:  As directed      Increase activity slowly    Complete by:  As directed           Current Discharge Medication List    START taking these medications   Details  pantoprazole (PROTONIX) 40 MG tablet Take 1 tablet (40 mg total) by mouth 2 (two) times daily before a meal. Qty: 60 tablet, Refills: 0      CONTINUE these medications which have CHANGED   Details  fentaNYL (DURAGESIC - DOSED MCG/HR) 12 MCG/HR Place 1 patch (12.5 mcg total) onto the skin every 3 (three) days. Qty: 5 patch, Refills: 0      CONTINUE these medications which have NOT CHANGED   Details  amiodarone (PACERONE) 200 MG tablet Take 1 tablet (200 mg total) by mouth daily. Qty: 30 tablet, Refills: 3   Associated Diagnoses: Atrial fibrillation, unspecified    amitriptyline (ELAVIL) 10 MG tablet Take 10 mg by mouth at bedtime.    amLODipine (NORVASC) 2.5 MG tablet TAKE 1 TABLET (2.5 MG TOTAL) BY MOUTH DAILY. Qty: 90 tablet, Refills: 1    donepezil (ARICEPT) 5 MG tablet Take 1 tablet (5 mg total) by mouth at bedtime. Qty: 30 tablet, Refills: 4   Associated Diagnoses: Alzheimer's dementia    dorzolamide-timolol (COSOPT)  22.3-6.8 MG/ML ophthalmic solution Place 1 drop into both eyes.     furosemide (LASIX) 20 MG tablet Take 1 tablet (20 mg total) by mouth daily as needed. Qty: 30 tablet, Refills: 2    latanoprost (XALATAN) 0.005 % ophthalmic solution Place 1 drop into both eyes at bedtime.     potassium chloride SA (K-DUR,KLOR-CON) 20 MEQ tablet Take 1 tablet (20 mEq total) by mouth daily as needed. Qty: 30 tablet, Refills: 3    silodosin (RAPAFLO) 8 MG CAPS capsule Take 8 mg by mouth daily with breakfast.    traMADol (ULTRAM) 50 MG tablet Take 50 mg by mouth every 4 (four) hours as needed for moderate pain.     traZODone (DESYREL) 50 MG  tablet Take 25 mg by mouth at bedtime.      STOP taking these medications     fentaNYL (DURAGESIC) 25 MCG/HR patch      rivaroxaban (XARELTO) 20 MG TABS tablet        Allergies  Allergen Reactions  . Codeine Diarrhea  . Penicillins     Other reaction(s): OTHER      The results of significant diagnostics from this hospitalization (including imaging, microbiology, ancillary and laboratory) are listed below for reference.    Significant Diagnostic Studies: Dg Chest 2 View  08/02/2014   CLINICAL DATA:  Anemia.  EXAM: CHEST  2 VIEW  COMPARISON:  July 25, 2009.  FINDINGS: Stable cardiomegaly. Central pulmonary vascular congestion is again noted. Left-sided pacemaker is unchanged in position. No pneumothorax or pleural effusion is noted. No acute pulmonary disease is noted. Bony thorax is intact.  IMPRESSION: Stable cardiomegaly and central pulmonary vascular congestion.   Electronically Signed   By: Sabino Dick M.D.   On: 08/02/2014 10:50   Dg Hip Complete Left  08/01/2014   CLINICAL DATA:  Multiple falls  EXAM: LEFT HIP - COMPLETE 2+ VIEW  COMPARISON:  02/22/2011  FINDINGS: No acute fracture. No dislocation. Postoperative changes are noted. Sacral nerve stimulators are noted.  IMPRESSION: No acute bony pathology.   Electronically Signed   By: Maryclare Bean M.D.   On: 08/01/2014 17:28   Ct Head Wo Contrast  08/05/2014   CLINICAL DATA:  Fall and altered mental status.  EXAM: CT HEAD WITHOUT CONTRAST  TECHNIQUE: Contiguous axial images were obtained from the base of the skull through the vertex without contrast.  COMPARISON:  08/02/2014  FINDINGS: Stable cerebral atrophy. Calcifications in the basal ganglia. No evidence for acute hemorrhage, mass lesion, midline shift, hydrocephalus or large infarct. Small polyp or retention cyst in the left maxillary sinus. No acute bone abnormality.  IMPRESSION: No acute intracranial abnormality.   Electronically Signed   By: Markus Daft M.D.   On:  08/05/2014 00:36   Ct Head Wo Contrast  08/02/2014   CLINICAL DATA:  Weakness for 7 days with falls. Altered level of consciousness.  EXAM: CT HEAD WITHOUT CONTRAST  TECHNIQUE: Contiguous axial images were obtained from the base of the skull through the vertex without intravenous contrast.  COMPARISON:  None.  FINDINGS: Mild age related volume loss/atrophy. No acute intracranial abnormality. Specifically, no hemorrhage, hydrocephalus, mass lesion, acute infarction, or significant intracranial injury. No acute calvarial abnormality. Visualized paranasal sinuses and mastoids clear. Orbital soft tissues unremarkable.  IMPRESSION: No acute intracranial abnormality.   Electronically Signed   By: Rolm Baptise M.D.   On: 08/02/2014 10:48   Dg Hand Complete Left  08/01/2014   CLINICAL DATA:  80 year old  male with multiple falls recently. Swelling of hand. No pain. Initial encounter  EXAM: LEFT HAND - COMPLETE 3+ VIEW  COMPARISON:  None.  FINDINGS: No fractures noted.  Widening of the interspace between the scaphoid and the lunate may indicate ligamentous injury.  Soft tissue swelling in a fairly symmetric distribution. Etiology indeterminate.  Degenerative changes sac proximal interphalangeal joint space and third distal interphalangeal joint space.  IMPRESSION: No fractures noted.  Widening of the interspace between the scaphoid and the lunate may indicate ligamentous injury.  Soft tissue swelling in a fairly symmetric distribution. Etiology indeterminate.  Degenerative changes sac proximal interphalangeal joint space and third distal interphalangeal joint space.   Electronically Signed   By: Chauncey Cruel M.D.   On: 08/01/2014 17:31    Microbiology: No results found for this or any previous visit (from the past 240 hour(s)).   Labs: Basic Metabolic Panel:  Recent Labs Lab 08/01/14 1534 08/02/14 0948 08/03/14 0641 08/04/14 0622  NA 144 143 144 140  K 4.0 3.9 3.9 4.3  CL 107 107 106 104    CO2 27 28 28 26   GLUCOSE 87 115* 89 91  BUN 28* 29* 24* 23  CREATININE 1.31 1.40* 1.22 1.16  CALCIUM 8.5 8.4 8.3* 8.2*   Liver Function Tests:  Recent Labs Lab 08/02/14 0948 08/03/14 0641 08/04/14 0622 08/04/14 0758  AST 20 19 18   --   ALT 13 12 12   --   ALKPHOS 53 51 52  --   BILITOT 1.4* 2.1* 1.9* 1.9*  PROT 5.8* 5.7* 5.6*  --   ALBUMIN 3.0* 2.9* 2.9*  --     Recent Labs Lab 08/02/14 0948  LIPASE 26   No results for input(s): AMMONIA in the last 168 hours. CBC:  Recent Labs Lab 08/01/14 1534 08/02/14 0948 08/03/14 0641 08/04/14 0622  WBC 2.7* 1.9* 2.4* 2.4*  NEUTROABS  --  1.4*  --  1.7  HGB 8.3* 7.6* 9.4* 9.7*  HCT 24.6* 23.0* 28.2* 28.8*  MCV 98.8 98.7 95.6 96.3  PLT 124* 103* 115* 127*   Cardiac Enzymes: No results for input(s): CKTOTAL, CKMB, CKMBINDEX, TROPONINI in the last 168 hours. BNP: BNP (last 3 results) No results for input(s): PROBNP in the last 8760 hours. CBG: No results for input(s): GLUCAP in the last 168 hours.     SignedNita Sells  [facility] 08/06/2014, 10:44 AM

## 2014-08-06 NOTE — Progress Notes (Signed)
[facility] and gave report to [place], RN for [employer], [place].  Answered all questions RN had regarding patient at this time.  Patient will be admitted to room 104.

## 2014-08-06 NOTE — Clinical Social Work Placement (Signed)
Clinical Social Work Department CLINICAL SOCIAL WORK PLACEMENT NOTE 08/06/2014  Patient:  [name], [name]  Account Number:  [id number] Admit date:  08/02/2014  Clinical Social Worker:  Benay Pike, LCSW  Date/time:  08/06/2014 12:09 PM  Clinical Social Work is seeking post-discharge placement for this patient at the following level of care:   [place]   (*CSW will update this form in Epic as items are completed)   08/06/2014  Patient/family provided with [place] Department of Clinical Social Work's list of facilities offering this level of care within the geographic area requested by the patient (or if unable, by the patient's family).  08/06/2014  Patient/family informed of their freedom to choose among providers that offer the needed level of care, that participate in Medicare, Medicaid or managed care program needed by the patient, have an available bed and are willing to accept the patient.  08/06/2014  Patient/family informed of [facility]' ownership interest in [facility], as well as of the fact that they are under no obligation to receive care at this facility.  PASARR submitted to EDS on 08/06/2014 PASARR number received on 08/06/2014  FL2 transmitted to all facilities in geographic area requested by pt/family on  08/06/2014 FL2 transmitted to all facilities within larger geographic area on   Patient informed that his/her managed care company has contracts with or will negotiate with  certain facilities, including the following:     Patient/family informed of bed offers received:  08/06/2014 Patient chooses bed at [facility] Physician recommends and patient chooses bed at  [facility]  Patient to be transferred to [facility] on  08/06/2014 Patient to be transferred to facility by RN Patient and family notified of transfer on 08/06/2014 Name of family member notified:  [name]- wife  The following physician request were entered  in Epic:   Additional Comments:  Benay Pike, [place]

## 2014-08-07 ENCOUNTER — Non-Acute Institutional Stay (SKILLED_NURSING_FACILITY): Payer: Medicare Other | Admitting: Internal Medicine

## 2014-08-07 ENCOUNTER — Other Ambulatory Visit: Payer: Self-pay | Admitting: *Deleted

## 2014-08-07 DIAGNOSIS — D61818 Other pancytopenia: Secondary | ICD-10-CM | POA: Diagnosis not present

## 2014-08-07 DIAGNOSIS — R29898 Other symptoms and signs involving the musculoskeletal system: Secondary | ICD-10-CM | POA: Diagnosis not present

## 2014-08-07 DIAGNOSIS — K922 Gastrointestinal hemorrhage, unspecified: Secondary | ICD-10-CM

## 2014-08-07 MED ORDER — TRAMADOL HCL 50 MG PO TABS
ORAL_TABLET | ORAL | Status: DC
Start: 2014-08-07 — End: 2014-09-09

## 2014-08-07 MED ORDER — FENTANYL 12 MCG/HR TD PT72
12.5000 ug | MEDICATED_PATCH | TRANSDERMAL | Status: DC
Start: 2014-08-07 — End: 2014-08-08

## 2014-08-07 NOTE — Telephone Encounter (Signed)
Patient is in the [facility] and spoke with [facility] and gave her the patients appointment for the Heart Doctor and Dr Briscoe Burns

## 2014-08-07 NOTE — Telephone Encounter (Signed)
[facility] 

## 2014-08-08 ENCOUNTER — Encounter: Payer: Self-pay | Admitting: Adult Health

## 2014-08-08 ENCOUNTER — Ambulatory Visit (INDEPENDENT_AMBULATORY_CARE_PROVIDER_SITE_OTHER): Payer: Medicare Other | Admitting: Adult Health

## 2014-08-08 VITALS — BP 98/60 | HR 64 | Ht 71.0 in | Wt 163.0 lb

## 2014-08-08 DIAGNOSIS — I5022 Chronic systolic (congestive) heart failure: Secondary | ICD-10-CM | POA: Diagnosis not present

## 2014-08-08 DIAGNOSIS — I10 Essential (primary) hypertension: Secondary | ICD-10-CM

## 2014-08-08 DIAGNOSIS — I481 Persistent atrial fibrillation: Secondary | ICD-10-CM

## 2014-08-08 DIAGNOSIS — I4819 Other persistent atrial fibrillation: Secondary | ICD-10-CM

## 2014-08-08 DIAGNOSIS — I080 Rheumatic disorders of both mitral and aortic valves: Secondary | ICD-10-CM

## 2014-08-08 NOTE — Progress Notes (Deleted)
Name: [name]    DOB: January 26, 1926  Age: 80 y.o.  MR#: [id number]       PCP:  [name], MD      Insurance: Payor: MEDICARE / Plan: MEDICARE PART A AND B / Product Type: *No Product type* /   CC:    Chief Complaint  Patient presents with  . Congestive Heart Failure  . Cardiomyopathy    Ischemic    VS Filed Vitals:   08/08/14 1448  BP: 98/60  Pulse: 64  Height: _0  (1.803 m)    Weights Current Weight  08/03/14 172 lb 9.9 oz (78.3 kg)  08/01/14 171 lb (77.565 kg)  07/05/14 163 lb (73.936 kg)    Blood Pressure  BP Readings from Last 3 Encounters:  08/08/14 98/60  08/06/14 121/80  08/01/14 140/70     Admit date:  (Not on file) Last encounter with RMR:  Visit date not found   Allergy Codeine and Penicillins  Current Outpatient Prescriptions  Medication Sig Dispense Refill  . traMADol (ULTRAM) 50 MG tablet Take one tablet by mouth every 4 hours as needed for moderate pain 180 tablet 5   No current facility-administered medications for this visit.    Discontinued Meds:    Medications Discontinued During This Encounter  Medication Reason  . fentaNYL (DURAGESIC - DOSED MCG/HR) 12 MCG/HR Error    Patient Active Problem List   Diagnosis Date Noted  . Acute upper GI bleeding 08/02/2014  . Recurrent falls 08/01/2014  . Hand pain, left 08/01/2014  . Left hip pain 08/01/2014  . Anemia due to other cause 08/01/2014  . Abrasion of hand, left 08/01/2014  . Need for vaccination with 13-polyvalent pneumococcal conjugate vaccine 05/26/2014  . Dysphagia, unspecified(787.20) 03/19/2014  . Chest pain 02/22/2014  . Atrial fibrillation, persistent 02/22/2014  . Encounter for medication management 11/10/2013  . Abnormal weight loss 08/09/2013  . Anorexia nervosa 08/09/2013  . Heme positive stool 07/22/2013  . Leg pain, bilateral 07/07/2013  . Bilateral leg weakness 05/01/2013  . Postural imbalance 05/01/2013  . Routine general medical examination at a health care  facility 11/06/2012  . Laceration of finger of left hand 10/12/2012  . Nocturia 07/10/2012  . Difficulty walking 06/12/2012  . Difficulty in walking 06/12/2012  . Abnormal gait 06/12/2012  . Lower extremity weakness 05/31/2012  . GERD (gastroesophageal reflux disease) 05/09/2012  . Change in bowel habits 04/19/2012  . Alzheimer's dementia 11/17/2011  . Malignant melanoma 03/03/2011  . BACK PAIN 02/18/2010  . GEN OSTEOARTHROSIS INVOLVING MULTIPLE SITES 11/25/2009  . CENTRAL HEARING LOSS 11/10/2009  . CARDIAC PACEMAKER IN SITU 07/22/2009  . INTERSTITIAL CYSTITIS 07/14/2009  . Pancytopenia 12/31/2008  . MITRAL REGURGITATION, MODERATE 12/31/2008  . Chronic systolic heart failure 27/67/0110  . FATIGUE 12/05/2008  . SYNCOPE 05/03/2008  . ERECTILE DYSFUNCTION 01/31/2008  . GLAUCOMA 01/31/2008  . Essential hypertension 01/31/2008    LABS    Component Value Date/Time   NA 140 08/04/2014 0622   NA 144 08/03/2014 0641   NA 143 08/02/2014 0948   K 4.3 08/04/2014 0622   K 3.9 08/03/2014 0641   K 3.9 08/02/2014 0948   CL 104 08/04/2014 0622   CL 106 08/03/2014 0641   CL 107 08/02/2014 0948   CO2 26 08/04/2014 0622   CO2 28 08/03/2014 0641   CO2 28 08/02/2014 0948   GLUCOSE 91 08/04/2014 0622   GLUCOSE 89 08/03/2014 0641   GLUCOSE 115* 08/02/2014 0948   BUN 23 08/04/2014 0622  BUN 24* 08/03/2014 0641   BUN 29* 08/02/2014 0948   CREATININE 1.16 08/04/2014 0622   CREATININE 1.22 08/03/2014 0641   CREATININE 1.40* 08/02/2014 0948   CREATININE 1.31 08/01/2014 1534   CREATININE 0.94 06/19/2013 1241   CREATININE 1.01 04/25/2013 1655   CALCIUM 8.2* 08/04/2014 0622   CALCIUM 8.3* 08/03/2014 0641   CALCIUM 8.4 08/02/2014 0948   GFRNONAA 56* 08/04/2014 0622   GFRNONAA 52* 08/03/2014 0641   GFRNONAA 44* 08/02/2014 0948   GFRAA 64* 08/04/2014 0622   GFRAA 61* 08/03/2014 0641   GFRAA 51* 08/02/2014 0948   CMP     Component Value Date/Time   NA 140 08/04/2014 0622   K 4.3  08/04/2014 0622   CL 104 08/04/2014 0622   CO2 26 08/04/2014 0622   GLUCOSE 91 08/04/2014 0622   BUN 23 08/04/2014 0622   CREATININE 1.16 08/04/2014 0622   CREATININE 1.31 08/01/2014 1534   CALCIUM 8.2* 08/04/2014 0622   PROT 5.6* 08/04/2014 0622   ALBUMIN 2.9* 08/04/2014 0622   AST 18 08/04/2014 0622   ALT 12 08/04/2014 0622   ALKPHOS 52 08/04/2014 0622   BILITOT 1.9* 08/04/2014 0758   GFRNONAA 56* 08/04/2014 0622   GFRAA 64* 08/04/2014 0622       Component Value Date/Time   WBC 2.4* 08/04/2014 0622   WBC 2.4* 08/03/2014 0641   WBC 1.9* 08/02/2014 0948   HGB 9.7* 08/04/2014 0622   HGB 9.4* 08/03/2014 0641   HGB 7.6* 08/02/2014 0948   HCT 28.8* 08/04/2014 0622   HCT 28.2* 08/03/2014 0641   HCT 23.0* 08/02/2014 0948   MCV 96.3 08/04/2014 0622   MCV 95.6 08/03/2014 0641   MCV 98.7 08/02/2014 0948    Lipid Panel     Component Value Date/Time   CHOL 179 06/19/2013 1241   TRIG 66 06/19/2013 1241   HDL 51 06/19/2013 1241   CHOLHDL 3.5 06/19/2013 1241   VLDL 13 06/19/2013 1241   LDLCALC 115* 06/19/2013 1241    ABG No results found for: PHART, PCO2ART, PO2ART, HCO3, TCO2, ACIDBASEDEF, O2SAT   Lab Results  Component Value Date   TSH 1.805 04/25/2013   BNP (last 3 results) No results for input(s): PROBNP in the last 8760 hours. Cardiac Panel (last 3 results) No results for input(s): CKTOTAL, CKMB, TROPONINI, RELINDX in the last 72 hours.  Iron/TIBC/Ferritin/ %Sat    Component Value Date/Time   IRON 57 08/01/2014 1534   TIBC 325 02/22/2011 1702   FERRITIN 119 08/01/2014 1534   IRONPCTSAT 6* 02/22/2011 1702     EKG Orders placed or performed during the hospital encounter of 08/02/14  . ED EKG  . ED EKG  . EKG 12-Lead  . EKG 12-Lead  . EKG     Prior Assessment and Plan Problem List as of 08/08/2014      Cardiovascular and Mediastinum   MITRAL REGURGITATION, MODERATE   Essential hypertension   Last Assessment & Plan   08/01/2014 Office Visit Written  08/01/2014 10:27 PM by Fayrene Helper, MD    Controlled, no change in medication     Chronic systolic heart failure   Last Assessment & Plan   07/05/2014 Office Visit Written 07/05/2014  9:57 AM by [name], MD    He is class 3A and this has worsened, I think because of atrial fib and possibly worsening MR. He is not a surgical candidate for MR repair because of advanced age, comorbidities and mild dementia. He will continue  his diuretic and we will consider uptitration if NSR does not make him feel better.    SYNCOPE   Atrial fibrillation, persistent   Last Assessment & Plan   08/01/2014 Office Visit Written 08/01/2014 10:10 PM by Fayrene Helper, MD    Recently sarted on xarelto, however pt has pancytopenia and recurrent falls. Hb checked today when he presents with large bruise , he will need transfusion. i will contact cardiology in am to make Doc aware of current situation       Digestive   GERD (gastroesophageal reflux disease)   Last Assessment & Plan   09/03/2013 Office Visit Written 09/09/2013  2:31 PM by Fayrene Helper, MD    Controlled, no change in medication Followed by GI    Dysphagia, unspecified(787.20)   Last Assessment & Plan   03/19/2014 Office Visit Written 03/21/2014 11:36 PM by Orvil Feil, NP    80 year old male with new-onset dysphagia. Vague historian but wife does note patient complains of hoarseness and notes his voice has changed. Father with history of throat cancer. Will proceed with upper endoscopy to rule out occult process; recommend subsequent ENT evaluation of negative EGD.   Proceed with upper endoscopy and dilation in the near future with Dr. [name]. The risks, benefits, and alternatives have been discussed in detail with patient. They have stated understanding and desire to proceed.  ENT referral if negative EGD    Acute upper GI bleeding     Nervous and Auditory   CENTRAL HEARING LOSS   Last Assessment & Plan   12/28/2013 Office  Visit Written 12/29/2013  9:11 AM by Fayrene Helper, MD    needs hearing aids, will advise checking belltone  For help with this    Alzheimer's dementia   Last Assessment & Plan   08/01/2014 Office Visit Written 08/01/2014 10:19 PM by Fayrene Helper, MD    rept MMSE needs, and hisdose of aricept needs to be increased at next visit    Lower extremity weakness   Last Assessment & Plan   11/09/2013 Office Visit Written 11/10/2013  3:15 PM by Fayrene Helper, MD    Improved following recent physical therapy sessions, also ahs not been using pain medication regularly    Bilateral leg weakness   Last Assessment & Plan   12/28/2013 Office Visit Written 12/29/2013  9:09 AM by Fayrene Helper, MD    Continued c/o lower extremity weakness, no falls, no interest in therapy currently      Musculoskeletal and Integument   GEN OSTEOARTHROSIS INVOLVING MULTIPLE SITES   Last Assessment & Plan   05/20/2014 Office Visit Written 05/26/2014  5:43 PM by Fayrene Helper, MD    Ongoing chronic pain in multiple joints with limitation in mobility Pt to use fenatnyl q 3 days 66mg dose , discouraged from continuing tramadol along with this    Abrasion of hand, left   Last Assessment & Plan   08/01/2014 Office Visit Written 08/01/2014 10:19 PM by MFayrene Helper MD    H/o hand trauma with slow healing and persistent scab, antibiotic ointment given for twice daily use fo next 5 to 7 days, if persists will need dermatology eval, had multiple myeloma on his foot which presented in a similar fashion      Genitourinary   INTERSTITIAL CYSTITIS   Last Assessment & Plan   11/09/2013 Office Visit Written 11/10/2013  3:17 PM by MFayrene Helper MD    C/o nocturia which  disturbs his sleep, no improvement with recent procedure reportedly. I have advised him to try half valium at bedtime, urology had prescribed 3 times daily, he has not been taking this, and I suspect he would be unable to tolerate that high  a dose. UA in the office is negative for infection. F/U with urology is to be arranged      Hematopoietic and Hemostatic   Pancytopenia   Last Assessment & Plan   03/01/2013 Office Visit Written 03/03/2013  3:04 PM by Fayrene Helper, MD    Improved numbers, followed by hematology      Other   ERECTILE DYSFUNCTION   Last Assessment & Plan   05/10/2011 Office Visit Written 05/10/2011 10:00 AM by Fayrene Helper, MD    Pt states that the cialis is not working as well, he still has a lot, advised him to discuss further with his urologist    Chicken   09/03/2013 Office Visit Written 09/09/2013  2:35 PM by Fayrene Helper, MD    The importance of regular eye exam and compliance with treatment is stressed    Ansted   11/09/2013 Office Visit Written 11/10/2013  3:18 PM by Fayrene Helper, MD    Improved with recent therapy. Will use tramadol 50 mg one daily, only if needed    FATIGUE   Last Assessment & Plan   03/01/2013 Office Visit Written 03/03/2013  3:03 PM by Fayrene Helper, MD    Worsened since acute illness    CARDIAC PACEMAKER IN SITU   Last Assessment & Plan   07/05/2014 Office Visit Written 07/05/2014  9:58 AM by [name], MD    His Medtronic BiV, DDD PM is working normally. Will recheck in 3 months. Hopefully he will be back in NSR.    Malignant melanoma   Last Assessment & Plan   05/10/2011 Office Visit Written 05/12/2011  8:50 AM by Fayrene Helper, MD    Followed locally as well as at [facility] in bowel habits   Last Assessment & Plan   04/19/2012 Office Visit Written 04/19/2012 11:44 AM by Fayrene Helper, MD    6 to 10 month h/o change in bM and weight loss    Difficulty walking   Last Assessment & Plan   05/20/2014 Office Visit Written 05/26/2014  5:45 PM by Fayrene Helper, MD    Improved following PT    Difficulty in walking   Last Assessment & Plan   07/10/2012 Office Visit  Written 07/10/2012 11:16 AM by Fayrene Helper, MD    Improved with therapy x 4 sessions, stopped stating his back was hurting more    Abnormal gait   Nocturia   Last Assessment & Plan   12/28/2013 Office Visit Written 12/29/2013  9:07 AM by Fayrene Helper, MD    Anxiety, poor sleep and frequent urination at night, no infection in urine. Trial of amitriptyline low dose    Laceration of finger of left hand   Last Assessment & Plan   10/12/2012 Office Visit Written 11/06/2012  9:50 AM by Fayrene Helper, MD    Open wound on finger no obvious infection, topical antibiotic, and area to be kept clean Tdap at visit, due    Routine general medical examination at a health care facility   Last Assessment & Plan   10/12/2012 Office Visit Written  11/06/2012  9:52 AM by Fayrene Helper, MD    Annual wellness completed as documented. Pt needs more complete memory eval at next visit, even though the mini cog was normal, he has been on medication for memory. He is to review the MOST from with his children, he is a full code, but may wish modification of treatment offered. 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 Was not able to undergo colonoscopy because he did not refrain from eating solid food the day before his procedure. His constipation has resolved. His appetite has improved. He has gained 3 pounds. At this time he is not interested in pursuing a colonoscopy or upper endoscopy. We discussed that his Hemoccult-positive stool may be completely benign finding from benign anorectal disease but cannot exclude  possibility of advanced polyp or malignancy. He voiced understanding that delay in workup may negatively affect his outcome. He is adamant about avoiding endoscopic evaluation. I note that he is most recent hemoglobin in November was very similar to that over the past several years.   Repeat CBC in April. Further recommendations at that point.    Abnormal weight loss   Anorexia nervosa   Encounter for medication management   Last Assessment & Plan   11/09/2013 Office Visit Written 11/10/2013  3:22 PM by Fayrene Helper, MD    Pt has all meds today, he and his wife have allowed me to get rid of all the tablets/ meds he is not to take. The current list reflects he recmmended meds as of this visit. Only variation may be in his eye drops, which he has not brought physically today    Chest pain   Last Assessment & Plan   02/22/2014 Office Visit Written 02/22/2014 11:36 AM by [name], MD    The etiology is unclear. It has resolved. As he has not been thought to have CAD in the past, I am not inclined to work up again. We will give him ntg.    Need for vaccination with 13-polyvalent pneumococcal conjugate vaccine   Last Assessment & Plan   05/20/2014 Office Visit Written 05/26/2014  5:44 PM by Fayrene Helper, MD    Vaccine administered    Recurrent falls   Last Assessment & Plan   08/01/2014 Office Visit Written 08/01/2014 10:06 PM by Fayrene Helper, MD    Needs evaluation for [place], also more involvement by patient's son/daughter , pt states house is cluttered , will follow through on both    Hand pain, left   Last Assessment & Plan   08/01/2014 Office Visit Written 08/01/2014 10:05 PM by Fayrene Helper, MD    Left hand pain and bruising s/p recurrent falls, abnormal xray of hand, needs further eval by ortho, may have ligamentous injury    Left hip pain   Last Assessment & Plan   08/01/2014 Office Visit Written 08/01/2014 10:08 PM by Fayrene Helper, MD    Left hip pain s/p  fall, xray obsained negative for fracture, pt does have  extensive bruise from left hip down posterior thigh to knee    Anemia due to other cause   Last Assessment & Plan   08/01/2014 Office Visit Written 08/01/2014 10:16 PM by Fayrene Helper, MD    Pt to return in am for testing for heme positive stool as e is on xarelto, need to ensure bleed is not internal as well as the obvious bruise from recurrent trauma If heme neg, will transfuse as out pt ,  I called and explained to his spouse to bring him to office for further management, and arrangements have been started for outpatient  transfusion        Imaging: Dg Chest 2 View  08/02/2014   CLINICAL DATA:  Anemia.  EXAM: CHEST  2 VIEW  COMPARISON:  July 25, 2009.  FINDINGS: Stable cardiomegaly. Central pulmonary vascular congestion is again noted. Left-sided pacemaker is unchanged in position. No pneumothorax or pleural effusion is noted. No acute pulmonary disease is noted. Bony thorax is intact.  IMPRESSION: Stable cardiomegaly and central pulmonary vascular congestion.   Electronically Signed   By: Sabino Dick M.D.   On: 08/02/2014 10:50   Dg Hip Complete Left  08/01/2014   CLINICAL DATA:  Multiple falls  EXAM: LEFT HIP - COMPLETE 2+ VIEW  COMPARISON:  02/22/2011  FINDINGS: No acute fracture. No dislocation. Postoperative changes are noted. Sacral nerve stimulators are noted.  IMPRESSION: No acute bony pathology.   Electronically Signed   By: Maryclare Bean M.D.   On: 08/01/2014 17:28   Ct Head Wo Contrast  08/05/2014   CLINICAL DATA:  Fall and altered mental status.  EXAM: CT HEAD WITHOUT CONTRAST  TECHNIQUE: Contiguous axial images were obtained from the base of the skull through the vertex without contrast.  COMPARISON:  08/02/2014  FINDINGS: Stable cerebral atrophy. Calcifications in the basal ganglia. No evidence for acute hemorrhage, mass lesion, midline shift, hydrocephalus or large infarct. Small polyp or retention cyst in the left  maxillary sinus. No acute bone abnormality.  IMPRESSION: No acute intracranial abnormality.   Electronically Signed   By: Markus Daft M.D.   On: 08/05/2014 00:36   Ct Head Wo Contrast  08/02/2014   CLINICAL DATA:  Weakness for 7 days with falls. Altered level of consciousness.  EXAM: CT HEAD WITHOUT CONTRAST  TECHNIQUE: Contiguous axial images were obtained from the base of the skull through the vertex without intravenous contrast.  COMPARISON:  None.  FINDINGS: Mild age related volume loss/atrophy. No acute intracranial abnormality. Specifically, no hemorrhage, hydrocephalus, mass lesion, acute infarction, or significant intracranial injury. No acute calvarial abnormality. Visualized paranasal sinuses and mastoids clear. Orbital soft tissues unremarkable.  IMPRESSION: No acute intracranial abnormality.   Electronically Signed   By: Rolm Baptise M.D.   On: 08/02/2014 10:48   Dg Hand Complete Left  08/01/2014   CLINICAL DATA:  80 year old male with multiple falls recently. Swelling of hand. No pain. Initial encounter  EXAM: LEFT HAND - COMPLETE 3+ VIEW  COMPARISON:  None.  FINDINGS: No fractures noted.  Widening of the interspace between the scaphoid and the lunate may indicate ligamentous injury.  Soft tissue swelling in a fairly symmetric distribution. Etiology indeterminate.  Degenerative changes sac proximal interphalangeal joint space and third distal interphalangeal joint space.  IMPRESSION: No fractures noted.  Widening of the interspace between the scaphoid and the lunate may indicate ligamentous injury.  Soft tissue swelling in a fairly symmetric distribution. Etiology indeterminate.  Degenerative changes sac  proximal interphalangeal joint space and third distal interphalangeal joint space.   Electronically Signed   By: Chauncey Cruel M.D.   On: 08/01/2014 17:31

## 2014-08-08 NOTE — Progress Notes (Signed)
I spoke with Dr Verlon Au via phone again about coming to see this pt

## 2014-08-08 NOTE — Progress Notes (Signed)
HPI: Mr. Grose is a 80 year old patient of Dr. Crissie Sickles, that we follow for ongoing assessment and management of chronic systolic heart failure, nonischemic cardiomyopathy, hypertension,atrial fibrillation, status post [employer]tr[employer] BiV pacemaker insertion. He was last seen by Dr. Lovena Le on 07/05/2014.he was found to be stable from a cardiac standpoint with no changes.  Unfortunately, the patient was admitted to the hospital in the setting of an upper GI bleed, also, chronic systolic heart failure. The patient was taken off of is a relative oh. He was discharged to a skilled nursing facility. He was continued on amiodarone therapy for rate control. Colonoscopy did not reveal any active bleeding.  He is now a resident of [facility] nursing home, where he is a, undergoing physical rehabilitation to significant deconditioning.he remains very weak, with complaints of bilateral knee pain, and left hip pain from where he had fall and hematoma. He is medically compliant as medications are being provided by the nurses to care. He denies any pain in his chest or shortness of breath. He has chronic lower extremity, edema , likely from not keeping legs elevated while in a wheelchair.     Allergies  Allergen Reactions  . Codeine Diarrhea  . Penicillins     Other reaction(s): OTHER    Current Outpatient Prescriptions  Medication Sig Dispense Refill  . traMADol (ULTRAM) 50 MG tablet Take one tablet by mouth every 4 hours as needed for moderate pain 180 tablet 5   No current facility-administered medications for this visit.    Past Medical History  Diagnosis Date  . Chronic systolic heart failure   . Rash and other nonspecific skin eruption   . Costochondritis, acute   . Glaucoma   . Erectile dysfunction   . Visual changes   . Pancytopenia   . Chronic pancreatitis     Based on CT findings  . History of melanoma in situ     JAN 2012--  LEFT HEEL W/ SLN BX  . Chronic back pain   . IC  (interstitial cystitis)   . BPH (benign prostatic hypertrophy)   . Cardiac pacemaker     CARDIOLOGIST-- DR Cristopher Peru (LAST PACE [facility] 05-15-2013)  . Asymptomatic carotid artery stenosis     BILATERAL MILD ICA---  <50% PER DUPLEX 03-08-2008  . History of syncope   . Hypertension   . CHB (complete heart block)   . Nonischemic cardiomyopathy   . Severe mitral regurgitation   . Urge urinary incontinence   . A-fib   . Dementia     Past Surgical History  Procedure Laterality Date  . Cataract extraction, bilateral  left  1997/   right 2003  . Pacemaker insertion  12-06-2008  DR GREGG TAYLOR    BiV PPM  --  [employer]  . Colonoscopy  08/18/2006    WUJ:WJXB granularity and erosions of the rectum of uncertain significance biopsied.  A long redundant, but otherwise normal-appearing colon.melanosi coli and minimal inflammation on bx  . Inguinal hernia repair Bilateral AS TEEN  . Interstim implant placement  2001  &  2007  . Cysto/ hod/  replacement interstim implant  05-14-2003  . Removal interstim implant/  cyst/  hod  04-14-2004  . Revision interstim implant and replace generator  05-15-2009    left upper buttock for urge urinary incontinence  . Wide excision left heel and sln bx  JUNE 2012  . Tonsillectomy  1950  . Left elbow surgery  2002  . Back surgery  X3  LAST ONE'90's  . Transthoracic echocardiogram  12-06-2008    LVSF 55-60%/  MODERATE MR/  MILD AR/  QUESTION DISTAL POSTERIOR HYPOKINESIS  . Interstim implant revision N/A 08/28/2013    Procedure: REPLACEMENT OF INTERSTIM-GENERATOR AND LEAD ;  Surgeon: [name], MD;  Location: [facility];  Service: Urology;  Laterality: N/A;  . Esophagogastroduodenoscopy N/A 03/28/2014    Dr. [name]: normal esophagus s/p dilation, mosaic appearance - chronic inflammation noted on bx  . Savory dilation N/A 03/28/2014    Procedure: SAVORY DILATION;  Surgeon: [name], MD;  [facility];  Service: Endoscopy;   Laterality: N/A;  Venia Minks dilation N/A 03/28/2014    Procedure: Venia Minks DILATION;  Surgeon: [name], MD;  [facility];  Service: Endoscopy;  Laterality: N/A;  . Colonoscopy N/A 08/05/2014    Procedure: COLONOSCOPY;  Surgeon: [name], MD;  [facility];  Service: Endoscopy;  Laterality: N/A;  PT NEEDS TCS AT 1000.    ROS: Review of systems complete and found to be negative unless listed above  PHYSICAL EXAM BP 98/60 mmHg  Pulse 64  Ht 5\' 11"  (1.803 m)  Wt 163 lb (73.936 kg)  BMI 22.74 kg/m2 General: Well developed, well nourished, in no acute distress, sitting in a wheelchair. No acute distress Head: Eyes PERRLA, No xanthomas.   Normal cephalic and atramatic  Lungs: Clear bilaterally to auscultation and percussion. Heart: HRRR S1 S2, with harsh mitral regurg murmur  Pulses are 2+ & equal.            No carotid bruit. No JVD.  No abdominal bruits. No femoral bruits. Abdomen: Bowel sounds are positive, abdomen soft and non-tender without masses or                  Hernia's noted. Msk:  Back normal,wheelchair-bound, with diminished overall strength and tone for age. Extremities: No clubbing, cyanosis, 2+edemabilaterally.  DP +1 Neuro: Alert and oriented X 3. Psych:  Good affect, responds appropriately  ASSESSMENT AND PLAN

## 2014-08-08 NOTE — Progress Notes (Signed)
Pt was found on the floor Bed alarm was sounding.  It appeared that the patient crawled around the rails the and was found on his left side.  His SCDs where in place. He had a skin tear on his right arm and the IV was pulled out.  He had a small knot to his left forehead temporal area.  After assessment of extremeties pt was helped from the floor by two nursing staff and positioned back in bed.  Dr Verlon Au was notified via text of all injuries and the current VS.  He responded soon after and stated that he would be in to further evaluate.

## 2014-08-08 NOTE — Patient Instructions (Signed)
Your physician recommends that you schedule a follow-up appointment in: [place] physician recommends that you return for lab work in: [place] CBC/BMP  Your physician recommends that you continue on your current medications as directed. Please refer to the Current Medication list given to you today.  Thank you for choosing [facility]!!

## 2014-08-08 NOTE — Assessment & Plan Note (Signed)
Blood pressure is low normal. He is very sedentary. Will not make any changes currently on this medication regimen. He continues generalized weakness, or fatigue. He may need to discontinue amlodipine. We will see again in one month once he is more active after physical therapy and reassess. Do not want him falling again, due to hypotension.

## 2014-08-08 NOTE — Assessment & Plan Note (Addendum)
He has been taken off of the relative secondary to GI bleed, frequent falls, with hematoma and bruising noted. Most recently on hospitalization. Heart rate is well-controlled. He remains on amiodarone. He denies any palpitations, or heart racing at this time. He has not been placed on aspirin. He he still has considerable amount of bruising on his arms and legs. We will continue him on current medication regimen without changes. We will see him again in a month's for ongoing management.he will have a BMET, and a CBC in one week.

## 2014-08-08 NOTE — Assessment & Plan Note (Signed)
Lungs are clear be has dependent edema, bilaterally. I have advised him on low sodium diet in place that on his orders to return to the 10 cm to make sure that he is eating over salty foods.he verbalizes understanding.

## 2014-08-08 NOTE — Assessment & Plan Note (Signed)
No apparent for repair or surgery at this time. Multiple comorbidities. Continue medical management.

## 2014-08-09 ENCOUNTER — Non-Acute Institutional Stay (SKILLED_NURSING_FACILITY): Payer: Medicare Other | Admitting: Internal Medicine

## 2014-08-09 DIAGNOSIS — H6123 Impacted cerumen, bilateral: Secondary | ICD-10-CM

## 2014-08-09 DIAGNOSIS — I5022 Chronic systolic (congestive) heart failure: Secondary | ICD-10-CM | POA: Diagnosis not present

## 2014-08-09 DIAGNOSIS — I10 Essential (primary) hypertension: Secondary | ICD-10-CM

## 2014-08-09 NOTE — Progress Notes (Signed)
Patient ID: [name], male   DOB: 27-Feb-1926, 80 y.o.   MRN: [id number]               HISTORY & PHYSICAL  DATE:  08/07/2014    FACILITY: [facility]    LEVEL OF CARE:   SNF   CHIEF COMPLAINT:  Admission to SNF, post stay at [facility], 08/02/2014 through 08/06/2014.    HISTORY OF PRESENT ILLNESS:  This is an 80 year-old man who apparently lives in a home with his wife in [place].  I am not precisely sure of his exact functional status prior to admission.  He was started on Xarelto on 07/05/2014 by his cardiologist as he had developed persistent atrial fibrillation.    He presented with weakness, dark stools in the setting of continued NSAID use.  He required a transfusion.  It appears as though his hemoglobin got as low as 7.6.  It would appear that he only had 1 U of packed cells.  Hemoglobin at 9.7 on discharge.    He apparently also has a history of pancytopenia with a diagnosis of myelodysplastic syndrome documented in [facility] records.    He apparently has had multiple falls at home and is unsteady on his feet.  He has been sent here for rehabilitation.     Other relevant medical issues included:    Mild congestive heart failure, put on 20 mg of Lasix a day.    He has interstitial cystitis, on Flomax, and has a bladder stimulator.  Followed by [facility] in [place].    He also has hyperbilirubinemia which is mostly indirect.  Looking back through [facility], he has had mild fluctuations in this, dating as far back in the record as I can see.  His haptoglobin was low and he had an elevated LVH.  Therefore, it was thought that he possibly has hemolysis, as well.     He was also noted to have a hematoma in his left buttock secondary to falling.     PAST MEDICAL HISTORY/PROBLEM LIST:    Upper GI bleed.  I think he had a colonoscopy in the hospital, although I really cannot see that result.  There was some suggestion he would need an endoscopy and suggestions that  he would need a follow-up CBC.    Pancytopenia.  As discussed above.  Felt to be myelodysplastic per [facility] records.    Left buttock hematoma with a history of multiple falls.    Systolic congestive heart failure.  Now on Lasix 20 mg.  Echocardiogram showed a normal EF and really not a lot of abnormal right- or left-sided documentation.    Interstitial cystitis with a bladder stimulator.  On Flomax.    Prerenal azotemia.    Hyperbilirubinemia, which is mostly indirect.  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  This has fluctuated for quite a period of time, however.    Atrial fibrillation.  Now off Xarelto.  On amiodarone.  He did not get aspirin in the hospital, again because of the upper GI source possibility.  We will try to think through this in two weeks or so if his hemoglobin stabilizes, there is no evidence of bleeding, and with further GI follow-up.    Bradycardia.  Status post permanent pacemaker in 2010.    Mild dementia.  On Aricept.     Hypertension.    CURRENT MEDICATIONS:  Discharge medications include:  Protonix 40 q.d.    Fentanyl 12 mg every three days.    Amiodarone 200 q.d.    Elavil 10 mg at bedtime.    Amlodipine 2.5 q.d.    Aricept 5 q.d.    Cosopt ophthalmic 1 drop into both eyes.    Lasix 20 q.d.    Xalatan ophthalmic 1 drop into both eyes at bedtime.    KCl 20 q.d.    Rapaflo 8 mg daily with breakfast (instead of Flomax).    Ultram 50 q.4 p.r.n.      Trazodone 25 q.h.s. p.r.n.    SOCIAL HISTORY:   HOUSING:  The patient tells me he lives in [place] with his wife.  He states he also has a house in [place].   FUNCTIONAL STATUS:  His exact functional status is unclear to me.  States he was using a cane and admits to multiple falls that I could not characterize today.    FAMILY HISTORY:  None related by the patient.              REVIEW OF SYSTEMS:   CHEST/RESPIRATORY:  No cough.   No shortness of breath.   CARDIAC:   No clear complaints of chest pain.   GI:  No nausea,  vomiting or abdominal pain.  No bleeding.   MUSCULOSKELETAL:  Extremities:  He is complaining of pain in the left medial ankle region.    PHYSICAL EXAMINATION:   VITAL SIGNS:    O2 SATURATIONS:  95% on room air.   PULSE:  82 and irregular.   RESPIRATIONS:  16.   GENERAL APPEARANCE:  Pleasant man in no distress.  He has eaten all of his breakfast.   CHEST/RESPIRATORY:  Bibasilar crackles are noted which are coarse.   CARDIOVASCULAR:  CARDIAC:  Heart sounds are regular.  There is an accentuated S2.  A 2/6 pansystolic murmur is heard with a PMI and left sternal border, although there is no radiation.  His JVP is not elevated.  There are no carotid bruits.   GASTROINTESTINAL:   LIVER/SPLEEN/KIDNEYS:  No liver, no spleen.  No tenderness.   HERNIA:  He does have a fullness in the epigastric area which I think might be a ventral hernia, although he is not tender here.     GENITOURINARY:  BLADDER:   Distended.  Note his stimulator scar in the right posterolateral flank area.   MUSCULOSKELETAL:   EXTREMITIES:  Widespread degenerative changes in knees, hands.  Wasting of the interossei in his hands.   There are no active joints.  There is no tenderness in the left ankle.  Good range of motion.   BACK:  He has had previous lumbar surgery x3, according to the patient.   NEUROLOGICAL:    CRANIAL NERVES:  Cranial nerves seem intact, although he has left ptosis.   SENSATION/STRENGTH:  He has normal upper extremity tone and strength.  In the lower extremities, he has 3-3+/5 hip flexor and abductor.  He has weakness in his plantar, his dorsiflexion, especially on the left.   DEEP TENDON REFLEXES:  He is diffusely hyporeflexic.  I was only able to pick up 1+ at the right brachial radialis.   Both his plantar responses are flexor.    BALANCE/GAIT:  He could not get himself out of bed by himself, moderate assistance of  one.  He is able to bring himself to a standing position.  He does not complain of orthostasis.  He is wide-based and his feet  are stuck on the floor.   PSYCHIATRIC:   MENTAL STATUS:   This is functional.  He is vague on some points.  He is listing as having mild dementia.  I would not disagree with this.  No depression or delirium are noted.    ASSESSMENT/PLAN:  Multitude of medical issues are noted.    Profound lower extremity weakness, gait ataxia, diffuse hyporeflexia.  He is not a diabetic.  This is the major issue we are going to have to deal with.  He is certainly not capable of independent ambulation.  I would wonder whether this is a peripheral neuropathy and/or severe lumbar spinal stenosis.    Upper GI bleed.   Apparently, an endoscopy is pending.  For this reason, he is not on aspirin or Xarelto at this point.  We will follow up on his hemoglobin, which was 9.7 on 08/04/2014.    Pancytopenia.  Felt to be myelodysplastic, apparently based on a review at [facility].  This does seem reasonable and I would agree that aggressive hematologic work-up is probably not indicated.  He may have already had it, frankly.    Protein calorie malnutrition.  His albumin was 2.9 on 08/04/2014.    Mostly indirect hyperbilirubinemia.  Although I agree with the possibility of hemolysis based on lab work quoted above, I would wonder whether this is longstanding Gilbert's syndrome and really is only worsened by his acute illness, poor oral intake, etc.  This seems to have fluctuated in [place] for at least a year or two.  He has no known history of liver disease.    Mild dementia.  I would agree with this.  He is vague, but well aware of a lot of his medical issues including his bladder stimulator, who is following him and where.  I do not think this, by itself, should preclude active rehabilitation.    I will repeat this gentleman's lab work as requested.  I have asked for a bladder ultrasound as I think  there is some degree of urinary retention.  This may be a chronic finding, however.    I am a bit lost as to where we are in the GI work-up.  He did have a colonoscopy.  I was unable to pull up this result.  Whether an endoscopy is planned, I am uncertain.    A major issue here will be aggressive physical and occupational therapy.  If this man was walking with a walker prior to hospitalization, I doubt he is capable of that now.    CPT CODE: 09381

## 2014-08-09 NOTE — Progress Notes (Signed)
Patient ID: [name], male   DOB: 04/24/1926, 80 y.o.   MRN: [id number]                                             [name]   LEVEL OF CARE: SNF  This is an acute visit   CHIEF COMPLAINT:acute visit secondary to ear infection-follow-up cardiac issues-renal insufficiency   HISTORY OF PRESENT ILLNESS: This is an 80 year-old man who apparently lives in a home with his wife in [place]. Marland Kitchen He was started on Xarelto on 07/05/2014 by his cardiologist as he had developed persistent atrial fibrillation.   He presented with weakness, dark stools in the setting of continued NSAID use. He required a transfusion. It appears as though his hemoglobin got as low as 7.6. It would appear that he only had 1 U of packed cells. Hemoglobin at 9.7 on dischargeupdated hemoglobin is 9.9 as of November 4.   He apparently also has a history of pancytopenia with a diagnosis of myelodysplastic syndrome documented in [facility] records.   He apparently has had multiple falls at home and is unsteady on his feet. He has been sent here for rehabilitation.   Other relevant medical issues included:   Mild congestive heart failure, put on 20 mg of Lasix a dayhe recently saw cardiology and per their note he appears to be doing relatively well here.  He appears to have some history of renal insufficiency most recent creatinine is 1.51 but appears his baseline runs in the lower ones-apparently he is eating and drinking fairly well according to nursing staff will update this however.  Again he is on Lasix as well.  His main complaint today is he is ears feel plugged he feels he has some wax in them       PAST MEDICAL HISTORY/PROBLEM LIST:   Upper GI bleed.  There was some suggestion he would need an endoscopy and suggestions that he would need a follow-up CBC.    Pancytopenia. As discussed above. Felt to be myelodysplastic per [facility] records.  s.   Systolic congestive heart failure. Now on Lasix 20 mg. Echocardiogram showed a normal EF and really not a lot of abnormal right- or left-sided documentation.   Interstitial cystitis with a bladder stimulator. On Flomax.   Prerenal azotemia.   Hyperbilirubinemia, which is mostly indirect. 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 This has fluctuated for quite a period of time, however.   Atrial fibrillation. Now off Xarelto. On amiodarone. He did not get aspirin in the hospital, again because of the upper GI source possibility. We will try to think through this in a few weeks o if his hemoglobin stabilizes, there is no evidence of bleeding, and with further GI follow-up.   Bradycardia. Status post permanent pacemaker in 2010.   Mild dementia. On Aricept.   Hypertension.  CURRENT MEDICATIONS: Discharge medications include:   Protonix 40 q.d.   Fentanyl 12 mg every three days.   Amiodarone 200 q.d.   Elavil 10 mg at bedtime.   Amlodipine 2.5 q.d.   Aricept 5 q.d.   Cosopt ophthalmic 1 drop into both eyes.   Lasix 20 q.d.   Xalatan ophthalmic 1 drop into both eyes at bedtime.   KCl 20 q.d.   Rapaflo 8 mg daily  with breakfast (instead of Flomax).   Ultram 50 q.4 p.r.n.   Trazodone 25 q.h.s. p.r.n.  SOCIAL HISTORY:  HOUSING: The patient  lives in [place] with his wife. He states he also has a house [place]  Review of systems.  In general no complaints of fever chills.  Skin does not complain of rashes or itching.  Head ears eyes nose mouth and throat-only complaint is he feels his ears are plugged especially his right ear does not complain of ear pain.  Respiratory no cough or shortness of breath complaints.  Cardiac no chest pain does have per nursing staff  chronic lower extremity edema at baseline.  GU does not complain of dysuria.  GI does not complain of abdominal discomfort nausea or vomiting.  Muscle skeletal is not complaining of joint pain today.  Neurologic is not complaining of any headache or dizziness.  nphysical exam.  Temperature is 97.9 pulse 72 respirations 20 blood pressure 109/63 weight is 170.8  .  GENERAL APPEARANCE: Pleasant man in no distress. His skin is warm and dry. Ear--he does have wax bilaterally-left ear tympanic membrane is partially visualized-right ear it is occluded by the wax-I do not see any erythema or drainage or exudate  CHEST/RESPIRATORY:clear to auscultation there is no labored breathing  CARDIOVASCULAR:  CARDIAC: Heart sounds are regular.. A 2/6 pansystolic murmur is heard . His JVP is not elevated.  GASTROINTESTINAL:  It is soft and nontender  HERNIA: He does have a fullness in the epigastric area which  might be a ventral hernia, although he is not tender here.  .  MUSCULOSKELETAL:  EXTREMITIES: Widespread degenerative changes in knees, hands.does move all extremities 4 ambulates in a wheelchair  .  NEUROLOGICAL:  CRANIAL NERVES: Cranial nerves seem intact, although he has left ptosis.I do not see lateralizing findings although he has lower extremity weakness     . labs.  08/07/2014.  Sodium 139 potassium 4.2 UN 28 creatinine 1.51.  Liver function tests within normal limits except bilirubin 0.4-albumin 2.5.  WBC 2.9 hemoglobin 9.9 platelets 108.    ASSESSMENT/PLAN: Multitude of medical issues are noted.   earwax-will treat with deep Brox for 3 days 10 drops twice a day-and flush with warm water on day 4-1/2 to repeat this E appears to have a fairly significant impaction.  History of CHF-he is on Lasix cardiology recently saw him and thought he was doing fairly well-they did state possibly DC Norvasc at some point if blood pressures  are low-I see recent systolics ranging 767-209 he does not appear symptomatic of any hypotension we'll have to monitor this however. Also monitor weights Monday Wednesday Friday notify provider gain greater than 3 pounds   History of what appears to be some renal insufficiency-will update a lab. His creatinine appears to be slightly rising.    Marland Kitchen   Upper GI bleed. Apparently, an endoscopy is pending. For this reason, he is not on aspirin or Xarelto at this point. We will follow up on his hemoglobin, which was 9.9 on 11/4//2015.   Pancytopenia. Felt to be myelodysplastic, apparently based on a review at [facility].  Marland Kitchen    [id number]                                       Share  No data filed        No data filed        No data filed

## 2014-08-09 NOTE — Progress Notes (Signed)
UR chart review completed.  

## 2014-08-11 ENCOUNTER — Encounter: Payer: Self-pay | Admitting: Internal Medicine

## 2014-08-11 DIAGNOSIS — H6120 Impacted cerumen, unspecified ear: Secondary | ICD-10-CM | POA: Insufficient documentation

## 2014-08-12 ENCOUNTER — Non-Acute Institutional Stay (SKILLED_NURSING_FACILITY): Payer: Medicare Other | Admitting: Internal Medicine

## 2014-08-12 DIAGNOSIS — K922 Gastrointestinal hemorrhage, unspecified: Secondary | ICD-10-CM

## 2014-08-12 DIAGNOSIS — I5022 Chronic systolic (congestive) heart failure: Secondary | ICD-10-CM | POA: Diagnosis not present

## 2014-08-12 DIAGNOSIS — D61818 Other pancytopenia: Secondary | ICD-10-CM | POA: Diagnosis not present

## 2014-08-13 ENCOUNTER — Ambulatory Visit (HOSPITAL_COMMUNITY): Payer: Medicare Other | Attending: Internal Medicine

## 2014-08-13 ENCOUNTER — Encounter: Payer: Self-pay | Admitting: Internal Medicine

## 2014-08-13 ENCOUNTER — Non-Acute Institutional Stay (SKILLED_NURSING_FACILITY): Payer: Medicare Other | Admitting: Internal Medicine

## 2014-08-13 DIAGNOSIS — K922 Gastrointestinal hemorrhage, unspecified: Secondary | ICD-10-CM | POA: Diagnosis not present

## 2014-08-13 DIAGNOSIS — R05 Cough: Secondary | ICD-10-CM | POA: Diagnosis not present

## 2014-08-13 DIAGNOSIS — I10 Essential (primary) hypertension: Secondary | ICD-10-CM

## 2014-08-13 DIAGNOSIS — R918 Other nonspecific abnormal finding of lung field: Secondary | ICD-10-CM | POA: Diagnosis not present

## 2014-08-13 DIAGNOSIS — R0989 Other specified symptoms and signs involving the circulatory and respiratory systems: Secondary | ICD-10-CM | POA: Insufficient documentation

## 2014-08-13 DIAGNOSIS — R0689 Other abnormalities of breathing: Secondary | ICD-10-CM | POA: Diagnosis not present

## 2014-08-13 DIAGNOSIS — I5022 Chronic systolic (congestive) heart failure: Secondary | ICD-10-CM

## 2014-08-13 DIAGNOSIS — I517 Cardiomegaly: Secondary | ICD-10-CM | POA: Diagnosis not present

## 2014-08-13 NOTE — Progress Notes (Signed)
Patient ID: [name], male   DOB: 11-09-1925, 80 y.o.   MRN: [id number]                                                                                            This is an acute visit.  Level care skilled.  Facility [facility]  Chief complaint-acute visit follow-up chest congestion cough  HISTORY OF PRESENT ILLNESS: This is an 80 year-old man who apparently lives in a home with his wife in [place]. Marland Kitchen He was started on Xarelto on 07/05/2014 by his cardiologist as he had developed persistent atrial fibrillation.   He presented with weakness, dark stools in the setting of continued NSAID use. He required a transfusion. It appears as though his hemoglobin got as low as 7.6. It would appear that he only had 1 U of packed cells. Hemoglobin at 9.7 on dischargeupdated hemoglobin is 9.9 as of November 4.   He apparently also has a history of pancytopenia with a diagnosis of myelodysplastic syndrome documented in [facility] records.   He apparently has had multiple falls at home and is unsteady on his feet. He has been sent here for rehabilitation.   Other relevant medical issues included:   Mild congestive heart failure, put on 20 mg of Lasix a dayhe recently saw cardiology and per their note he appear to be doing relatively well-however apparently over the weekend he developed--.some increased edema and wheezing-Dr. Dellia Nims did see him yesterday and increased his Lasix to 40 mg a day also gave him and neck should dose of Lasix yesterday.  Today nursing reports patient does have some intermittent coughing or in some increased congestion-a chest x-ray was ordered which showed progression of right upper lobe airspace disease likely pulmonary edema.  Dr. Dellia Nims did evaluate the x-ray feels a pulmonary edema is baseline-patient is not in any distress his O2 saturations are in the 90s on room air-his weight actually decreased a couple pounds today--edema appears to  be somewhat improved according to nursing  .        PAST MEDICAL HISTORY/PROBLEM LIST:   Upper GI bleed.  There was some suggestion he would need an endoscopy and suggestions that he would need a follow-up CBC.   Pancytopenia. As discussed above. Felt to be myelodysplastic per [facility] records.  s.   Systolic congestive heart failure. Now on Lasix 40 mg. Echocardiogram showed a normal EF and really not a lot of abnormal right- or left-sided documentation.   Interstitial cystitis with a bladder stimulator. On Flomax.   Prerenal azotemia.   Hyperbilirubinemia, which is mostly indirect. 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 This has fluctuated for quite a period of time, however.   Atrial fibrillation. Now off Xarelto. On amiodarone. He did not get aspirin in the hospital, again because of the upper GI source possibility. We will try to think through this in a few weeks o if his hemoglobin stabilizes, there is no evidence of bleeding, and with further GI follow-up.   Bradycardia. Status post permanent pacemaker in 2010.  Mild dementia. On Aricept.   Hypertension.  CURRENT MEDICATIONS: Discharge medications include:   Protonix 40 q.d.   Fentanyl 12 mg every three days.   Amiodarone 200 q.d.   Elavil 10 mg at bedtime.   Amlodipine 2.5 q.d.   Aricept 5 q.d.   Cosopt ophthalmic 1 drop into both eyes.   Lasix 40 q.d.   Xalatan ophthalmic 1 drop into both eyes at bedtime.   KCl 20 q.d.   Rapaflo 8 mg daily with breakfast (instead of Flomax).   Ultram 50 q.4 p.r.n.   Trazodone 25 q.h.s. p.r.n.  SOCIAL HISTORY:  HOUSING: The patient lives in [place] with his wife. He states he also has a house [place]  Review of systems.  In general no complaints of fever chills.  Skin does not complain of rashes or itching.  Head ears eyes nose  mouth and throat-saw him last week for some ear impaction needs does not complain of this today.  Respiratory --has a cough---but no shortness of breath complaints.  Cardiac no chest pain does have per nursing staff chronic lower extremity edemasomewhat improved today compared to yesterday.  GU does not complain of dysuria.  GI does not complain of abdominal discomfort nausea or vomiting.  Muscle skeletal is not complaining of joint pain today.  Neurologic is not complaining of any headache or dizziness.  physical exam.  Temperature 98.2 pulse 62 respirations 21 blood pressure 163/84 O2 saturation 93% on room air  .  GENERAL APPEARANCE: Pleasant man in no distress.sitting comfortably in his wheelchair His skin is warm and dry. ears-does not complain of impaction or discomfort today   CHEST/RESPIRATORY:has some diffuse expiratory wheezing congestion-there is no labored breathing or sign of any distress  CARDIOVASCULAR:  CARDIAC: Heart sounds are regular.. A 2/6 pansystolic murmur is heard--has baseline edema according to nursing staff this is somewhat reduced from yesterday .   GASTROINTESTINAL:  It is soft and nontenderpositive bowel sounds  HERNIA: He does have a fullness in the epigastric area which might be a ventral hernia, although he is not tender here.  .  MUSCULOSKELETAL:  EXTREMITIES: Widespread degenerative changes in knees, hands.does move all extremities 4 ambulates in a wheelchair  .  NEUROLOGICAL:  CRANIAL NERVES: Cranial nerves seem intact, although he has left ptosis.I do not see lateralizing findings although he has lower extremity weakness     . labs.  08/12/2014.  WBC 4.9 hemoglobin 9.1 platelets 83,000.  Sodium 141 potassium 4.1 BUN 23 creatinine 1.22.    08/07/2014.  Sodium 139 potassium 4.2 UN 28 creatinine 1.51.  Liver function tests within normal limits except bilirubin 0.4-albumin 2.5.  WBC 2.9  hemoglobin 9.9 platelets 108.     ASSESSMENT/PLAN:   .  History of CHF-as noted above Lasix has been increased-clinically appears stable chest x-ray results as noted above-this was discussed with Dr. Dellia Nims via phone-Will start DuoNeb nebs when necessary every 4 hours also will add Mucinex 600 mg twice a day for 5 days-continue to monitor vital signs and pulse ox closely every shift-update BMP is ordered for tomorrow    History of what appears to be some renal insufficiency-at this point appears stable but will update tomorrow.    Marland Kitchen   Upper GI bleed. Apparently, an endoscopy is pending. For this reason, he is not on aspirin or Xarelto at this point. We will follow up on his hemoglobin,most recently 9.1 on lab yesterday-updated CBC is pending for next week   Pancytopenia. Felt to be  myelodysplastic, apparently based on a review at [facility].--again updated lab is pending  hypertension-cardiology did start him on Norvasc 2.5 mg a day-however during recent cardiology visit there was some concern that his blood pressures might be running a bit low and possibly may need Norvasc discontinued-recent blood pressures do appear to be variable somewhat elevated today with systolic in the 588T I see 150s   Also a 1 39 as well as a 109 there is quite a bit of variability at this point will monitor he does not appear to have consistent significantly depressed systolics or elevated systolics but will have to keep an eye on this  [id number]  .

## 2014-08-14 NOTE — Progress Notes (Signed)
Patient ID: [name], male   DOB: 1926/08/07, 80 y.o.   MRN: [id number]               PROGRESS NOTE  DATE:  08/12/2014    FACILITY: [facility]    LEVEL OF CARE:   SNF   Acute Visit   CHIEF COMPLAINT:  Pulmonary congestion, follow up lab work.    HISTORY OF PRESENT ILLNESS:  This is a gentleman who came to [place] after having some form of GI bleeding.  He required 1 U of packed red cells for a hemoglobin of 7.6.  His discharge hemoglobin was 9.7.  Today, it is repeated at 9.1.    He also has a history of congestive heart failure.  He was discharged on Lasix 20 mg a day.  The staff have noted crackles in his lungs and wheezing.    Finally, the patient has a history of interstitial cystitis and he is asking for Urispas.  When I admitted him here, he had a high bladder volume although he voids and has a postvoid residual of around 100.  Together with his bladder stimulator, I think this is probably reasonable.  I do not think the Bard Herbert would be a good idea as I believe all of this has some anticholinergic activity which would contribute to any degree of urinary retention.  He is already on Flomax.    LABORATORY DATA:  Lab work from today shows a hemoglobin of 9.1, white count of 4.9, platelet count of 83.  His differential count shows 84% neutrophils.    Basic metabolic panel is normal.  His BUN is 23, creatinine of 1.22.    REVIEW OF SYSTEMS:   CHEST/RESPIRATORY:  He is not complaining of shortness of breath.   CARDIAC:   No clear chest pain, although his wife is noting edema.    PHYSICAL EXAMINATION:   GENERAL APPEARANCE:  The patient does not look to be in any distress.   CHEST/RESPIRATORY:  Indeed, there are bibasilar crackles and mild wheezing.   CARDIOVASCULAR:  CARDIAC:  3/6 pansystolic murmur, maximal at the PMI although he is well heard up into the base of his heart.  His JVP is elevated at 90.   EDEMA/VARICOSITIES:  He has 2-3+ edema extending up to the back of  his thighs, although he has no coccyx edema.     ASSESSMENT/PLAN:    Congestive heart failure.  I am going to increase his Lasix today to 40 mg a day.    GI bleeding.  There is no obvious concern that he is bleeding.  His hemoglobin is 9.1.  He also is felt, I think, to have myelodysplastic syndrome and has had pancytopenia work-up at [facility].   For now, his hemoglobin seems stable.    History of urinary retention, with a bladder stimulator.  On Flomax.  This appears to be stable.  I generally do not think Bard Herbert would be a good idea here.    CPT CODE: [id number]

## 2014-08-16 ENCOUNTER — Non-Acute Institutional Stay (SKILLED_NURSING_FACILITY): Payer: Medicare Other | Admitting: Internal Medicine

## 2014-08-16 DIAGNOSIS — E870 Hyperosmolality and hypernatremia: Secondary | ICD-10-CM

## 2014-08-16 DIAGNOSIS — N289 Disorder of kidney and ureter, unspecified: Secondary | ICD-10-CM

## 2014-08-16 DIAGNOSIS — M25561 Pain in right knee: Secondary | ICD-10-CM | POA: Diagnosis not present

## 2014-08-16 DIAGNOSIS — I5020 Unspecified systolic (congestive) heart failure: Secondary | ICD-10-CM | POA: Diagnosis not present

## 2014-08-16 DIAGNOSIS — M25562 Pain in left knee: Secondary | ICD-10-CM | POA: Diagnosis not present

## 2014-08-16 DIAGNOSIS — M79631 Pain in right forearm: Secondary | ICD-10-CM | POA: Diagnosis not present

## 2014-08-16 DIAGNOSIS — M79642 Pain in left hand: Secondary | ICD-10-CM | POA: Diagnosis not present

## 2014-08-16 DIAGNOSIS — M25521 Pain in right elbow: Secondary | ICD-10-CM | POA: Diagnosis not present

## 2014-08-18 ENCOUNTER — Encounter: Payer: Self-pay | Admitting: Internal Medicine

## 2014-08-18 NOTE — Progress Notes (Signed)
Patient ID: [name], male   DOB: 06-12-1926, 80 y.o.   MRN: [id number]   This is an acute visit.  Level care skilled.  Facility is [facility].  Date is 08/16/2014.  Chief complaint-acute visit secondary to renal insufficiency-history CHF.  History of present illness.  Patient is a pleasant elderly resident here for rehabilitation after hospitalization and involve GI bleeding he does have a history of pancytopenia as well as mild dysplastic syndrome.  This appears to have stabilized --most recent hemoglobin was 9.6. Another issue is congestive heart failure thought to be systolicDr. Dellia Nims did increase his Lasix recently from 20 up to 40 mg a day secondary to concerns of some increased chest congestion and edema.  Clinically he appears to be stable we will and monitoring his labs and creatinine did go up to 1.4 on lab done earlier this week.  Updated lab today however shows improvement with a creatinine of 1.14 BUN of 36-do note his sodium is minimally elevated at 146.  Clinically his vital signs are stable he does not complain of any increased shortness of breath or chest pain his weight appears to be stable to slightly down from November 6.  Patient is a somewhat poor strain secondary to dementia.  Family medical social history is been reviewed per admission note on 08/07/2014.  Medications have been reviewed per MAR.  Review of systems again somewhat limited secondary to dementia but he is not complaining of any increased shortness of breath cough or chest pain edema appears to be baseline nursing staff does not report any issues.  Physical exam. Positive dentures 98.2 pulse 56 respirations 20 blood pressure 130/69 weight is 168.6 this appears down a couple pounds since November 6.  In general this is a pleasant LE male in no distress sitting comfortably in his wheelchair.  His skin is warm and dry.-skin turgor does not appear to be impaired.  Oropharynx is clear mucous  membranes are fairly moist  Chest there is a small amount of rhonchi that clear with cough there is no labored breathing.  Heart is regular rate and rhythm with slight bradycardiahe has a 3/6 systolic murmur-edema I would say is 1-2 plus bilaterally.  Abdomen is soft nontender with positive bowel sounds.  Labs.  08/16/2014.  Sodium 146 potassium 3.5 BUN 36 creatinine 1.14.  08/15/2014.WBC 4.0 hemoglobin 9.6  -

## 2014-08-22 ENCOUNTER — Ambulatory Visit (HOSPITAL_COMMUNITY): Payer: Medicare Other | Attending: Internal Medicine

## 2014-08-22 DIAGNOSIS — M25521 Pain in right elbow: Secondary | ICD-10-CM | POA: Insufficient documentation

## 2014-08-22 DIAGNOSIS — Z8781 Personal history of (healed) traumatic fracture: Secondary | ICD-10-CM | POA: Insufficient documentation

## 2014-08-23 ENCOUNTER — Ambulatory Visit (HOSPITAL_COMMUNITY): Payer: No Typology Code available for payment source | Attending: Internal Medicine

## 2014-08-23 ENCOUNTER — Non-Acute Institutional Stay (SKILLED_NURSING_FACILITY): Payer: Medicare Other | Admitting: Internal Medicine

## 2014-08-23 DIAGNOSIS — I4819 Other persistent atrial fibrillation: Secondary | ICD-10-CM

## 2014-08-23 DIAGNOSIS — M25521 Pain in right elbow: Secondary | ICD-10-CM | POA: Diagnosis not present

## 2014-08-23 DIAGNOSIS — D61818 Other pancytopenia: Secondary | ICD-10-CM

## 2014-08-23 DIAGNOSIS — I481 Persistent atrial fibrillation: Secondary | ICD-10-CM | POA: Diagnosis not present

## 2014-08-23 DIAGNOSIS — M79631 Pain in right forearm: Secondary | ICD-10-CM | POA: Diagnosis not present

## 2014-08-23 DIAGNOSIS — R531 Weakness: Secondary | ICD-10-CM | POA: Diagnosis not present

## 2014-08-23 DIAGNOSIS — M7989 Other specified soft tissue disorders: Secondary | ICD-10-CM | POA: Insufficient documentation

## 2014-08-23 DIAGNOSIS — R600 Localized edema: Secondary | ICD-10-CM | POA: Diagnosis not present

## 2014-08-23 DIAGNOSIS — G309 Alzheimer's disease, unspecified: Secondary | ICD-10-CM

## 2014-08-23 DIAGNOSIS — I5022 Chronic systolic (congestive) heart failure: Secondary | ICD-10-CM | POA: Diagnosis not present

## 2014-08-23 DIAGNOSIS — F0280 Dementia in other diseases classified elsewhere without behavioral disturbance: Secondary | ICD-10-CM | POA: Diagnosis not present

## 2014-08-23 DIAGNOSIS — M79642 Pain in left hand: Secondary | ICD-10-CM | POA: Diagnosis not present

## 2014-08-23 DIAGNOSIS — K922 Gastrointestinal hemorrhage, unspecified: Secondary | ICD-10-CM | POA: Diagnosis not present

## 2014-08-23 NOTE — Progress Notes (Signed)
Patient ID: [name], male   DOB: 1925/10/08, 80 y.o.   MRN: [id number]   Patient ID: [name], male   DOB: Jan 05, 1926, 80 y.o.   MRN: [id number]            This is a discharge note.  Facility [facility]    LEVEL OF CARE:   SNF   CHIEF COMPLAINT: Discharge note.    HISTORY OF PRESENT ILLNESS:  This is an 80 year-old man who apparently lives in a home with his wife in [place].  I am not precisely sure of his exact functional status prior to admission.  He was started on Xarelto on 07/05/2014 by his cardiologist as he had developed persistent atrial fibrillation.    He presented with weakness, dark stools in the setting of continued NSAID use.  He required a transfusion.  It appears as though his hemoglobin got as low as 7.6.  It would appear that he only had 1 U of packed cells.  Hemoglobin at 9.7 on discharge. This has remained stable during his stay here with hemoglobin of 9.6 today    He apparently also has a history of pancytopenia with a diagnosis of myelodysplastic syndrome documented in [facility] records.    He apparently has had multiple falls at home and is unsteady on his feet.  He was sent here for rehabilitation.--Apparently has gained some strength according to therapy but still will need a rolling walker he has been using a walker some in therapy would benefit from continued PT and OT     Other relevant medical issues included:    Mild congestive heart failure, put on 20 mg of Lasix a day. Initially in this has been increased to 40 mg a day recently by Dr. Dellia Nims    He has interstitial cystitis, on Flomax, and has a bladder stimulator.  Followed by [facility] in [place].    He also has hyperbilirubinemia which is mostly indirect. Per [facility], he has had mild fluctuations in this, dating as far back in the record as I can see.  His haptoglobin was low and he had an elevated LVH.  Therefore, it was thought that he possibly has hemolysis, as well.    His stay  here has been relatively unremarkable his creatinine did rise somewhat initially put on Lasix but this appears to have stabilized as well.  He will be going home with his spouse and also will have private sitters.  He will need a rolling walker for ambulation as well as continued PT and OT.     PAST MEDICAL HISTORY/PROBLEM LIST:    Upper GI bleed.  I think he had a colonoscopy in the hospital, although I really cannot see that result.  There was some suggestion he would need an endoscopy and suggestions that he would need a follow-up CBC.    Pancytopenia.  As discussed above.  Felt to be myelodysplastic per [facility] records.    Left buttock hematoma with a history of multiple falls.    Systolic congestive heart failure.  Now on Lasix 40 mg.  Echocardiogram showed a normal EF and really not a lot of abnormal right- or left-sided documentation.    Interstitial cystitis with a bladder stimulator.  On Flomax.    Prerenal azotemia.    Hyperbilirubinemia, which is mostly indirect.  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  This has fluctuated for quite a period of  time, however.    Atrial fibrillation.  Now off Xarelto.  On amiodarone.  He did not get aspirin in the hospital, again because of the upper GI source possibility.        Bradycardia.  Status post permanent pacemaker in 2010.    Mild dementia.  On Aricept.     Hypertension.     CURRENT MEDICATIONS reviewed per MAR:     SOCIAL HISTORY:   HOUSING:  The patient tells  lives in [place] with his wife.  He states he also has a house in [place].       FAMILY HISTORY:  None related by the patient.              REVIEW OF SYSTEMS General no complaints of fever chills says he feels well.  Skin does not complain of any rashes or itching had a hematoma on his buttocks that apparently has resolved this was the result of a previous fall.   head ears eyes nose mouth and  throat-does not plan any visual changes or sore throat:   CHEST/RESPIRATORY:  No cough.  No shortness of breath.   CARDIAC:   No clear complaints of chest pain.   GI:  No nausea,  vomiting or abdominal pain.  No bleeding.   MUSCULOSKELETAL:  Does not complain of joint pain today apparently he has gained some strength per therapy.  Neurologic is not complaining of any dizziness or headache.  Psych appears to have some moderate dementia.      PHYSICAL EXAMINATION:   Temperature is 98.3 pulse 58 respirations 19 and blood pressure 128/82 taken manually weight is 163 this is down about 7 pounds since admission-suspect this could be largely fluid related.  O2 saturations have been in the 90s on room air.   Marland Kitchen   GENERAL APPEARANCE:  Pleasant man in no distress.  .   CHEST/RESPIRATORY: There to auscultation no labored breathing   CARDIOVASCULAR:  CARDIAC:  Heart sounds are regular.  T.  A 2/6 pansystolic murmur is heard with a PMI and left sternal border,  His JVP is not elevated.   He has some lower extremity edema right greater than left I would say one plus to 2+ on the left   GASTROINTESTINAL:   LIVER/SPLEEN/KIDNEYS:  No liver, no spleen.  No tenderness.   HERNIA:  He does have a fullness in the epigastric are-- might be a ventral hernia, although he is not tender here--bowel sounds are positive.    .   MUSCULOSKELETAL:   EXTREMITIES:  Widespread degenerative changes in knees, hands.  Wasting of the interossei in his hands.   There are no active joints. He does ambulate at times with  a walker but largely ambulating in a wheelchair.   BACK:  He has had previous lumbar surgery x3, according to the patient.   NEUROLOGICAL:    CRANIAL NERVES:  Cranial nerves seem intact, although he has left ptosis.   r.   PSYCHIATRIC:   MENTAL STATUS:   This is functional.  He is vague on some points.  He is listing as having mild dementia.  I would not disagree with this.  No depression or delirium are  noted.   Labs.  08/23/2014.  WBC 2.7 hemoglobin 9.6 platelets 141.   sodium 145 potassium 3.8 BUN 31 creatinine 1.33.      ASSESSMENT/PLAN:  Multitude of medical issues are noted.    Profound lower extremity weakness, gait ataxia, diffuse hyporeflexia--he has progressed with  physical therapy however still has significant weakness will need a rolling walker as well as continued PT and OT  .    Upper GI bleed.   Apparently, an endoscopy is pending., he is not on aspirin or Xarelto at this point--this was followed by GI and have written order try to make sure GI follow-up is arranged..    Pancytopenia.  Felt to be myelodysplastic, apparently based on a review at [facility].  .   .    Mostly indirect apparently this is been worked up to some extent-will defer to primary care provider clinically he appears stable.    .    Mild dementia.  I would agree with this.  He is vague, but well aware of a lot of his medical issues--peers to be functional but certainly would benefit from having sitters.--He is on Aricept.    History CHF again his Lasix has been increased he is on potassium supplementation as well as will defer follow-up to primary care provider clinically he appears stable.    History of renal insufficiency creatinine did rise a bit initially on the increased Lasix but this appears to have stabilized as well as.    History of atrial fibrillation this appears rate controlled he continues on amiodarone.-Currently not on anticoagulation as noted above-this is followed by cardiology         Some increased edema of his right leg-we will order venous Doppler to rule out any DVT before discharge     History of chronic pain he is on Duragesic patch this appears to be stable.            .     .    CPT CODE: [id number] note greater than 40 minutes spent on this discharge summary

## 2014-08-24 ENCOUNTER — Encounter: Payer: Self-pay | Admitting: Internal Medicine

## 2014-08-27 DIAGNOSIS — D61818 Other pancytopenia: Secondary | ICD-10-CM | POA: Diagnosis not present

## 2014-08-27 DIAGNOSIS — K929 Disease of digestive system, unspecified: Secondary | ICD-10-CM | POA: Diagnosis not present

## 2014-08-27 DIAGNOSIS — I5022 Chronic systolic (congestive) heart failure: Secondary | ICD-10-CM | POA: Diagnosis not present

## 2014-08-27 DIAGNOSIS — M6281 Muscle weakness (generalized): Secondary | ICD-10-CM | POA: Diagnosis not present

## 2014-09-03 DIAGNOSIS — M6281 Muscle weakness (generalized): Secondary | ICD-10-CM | POA: Diagnosis not present

## 2014-09-03 DIAGNOSIS — D61818 Other pancytopenia: Secondary | ICD-10-CM | POA: Diagnosis not present

## 2014-09-03 DIAGNOSIS — I5022 Chronic systolic (congestive) heart failure: Secondary | ICD-10-CM | POA: Diagnosis not present

## 2014-09-03 DIAGNOSIS — K929 Disease of digestive system, unspecified: Secondary | ICD-10-CM | POA: Diagnosis not present

## 2014-09-04 DIAGNOSIS — M6281 Muscle weakness (generalized): Secondary | ICD-10-CM | POA: Diagnosis not present

## 2014-09-04 DIAGNOSIS — I5022 Chronic systolic (congestive) heart failure: Secondary | ICD-10-CM | POA: Diagnosis not present

## 2014-09-04 DIAGNOSIS — K929 Disease of digestive system, unspecified: Secondary | ICD-10-CM | POA: Diagnosis not present

## 2014-09-04 DIAGNOSIS — D61818 Other pancytopenia: Secondary | ICD-10-CM | POA: Diagnosis not present

## 2014-09-06 DIAGNOSIS — K929 Disease of digestive system, unspecified: Secondary | ICD-10-CM | POA: Diagnosis not present

## 2014-09-06 DIAGNOSIS — D61818 Other pancytopenia: Secondary | ICD-10-CM

## 2014-09-06 DIAGNOSIS — M6281 Muscle weakness (generalized): Secondary | ICD-10-CM | POA: Diagnosis not present

## 2014-09-06 DIAGNOSIS — I5022 Chronic systolic (congestive) heart failure: Secondary | ICD-10-CM | POA: Diagnosis not present

## 2014-09-09 ENCOUNTER — Ambulatory Visit (INDEPENDENT_AMBULATORY_CARE_PROVIDER_SITE_OTHER): Payer: Medicare Other | Admitting: Family Medicine

## 2014-09-09 ENCOUNTER — Encounter: Payer: Self-pay | Admitting: Family Medicine

## 2014-09-09 ENCOUNTER — Encounter (INDEPENDENT_AMBULATORY_CARE_PROVIDER_SITE_OTHER): Payer: Self-pay

## 2014-09-09 VITALS — BP 120/78 | HR 62 | Resp 16 | Ht 71.0 in | Wt 158.4 lb

## 2014-09-09 DIAGNOSIS — K929 Disease of digestive system, unspecified: Secondary | ICD-10-CM | POA: Diagnosis not present

## 2014-09-09 DIAGNOSIS — R29898 Other symptoms and signs involving the musculoskeletal system: Secondary | ICD-10-CM

## 2014-09-09 DIAGNOSIS — M5440 Lumbago with sciatica, unspecified side: Secondary | ICD-10-CM

## 2014-09-09 DIAGNOSIS — I5022 Chronic systolic (congestive) heart failure: Secondary | ICD-10-CM | POA: Diagnosis not present

## 2014-09-09 DIAGNOSIS — I10 Essential (primary) hypertension: Secondary | ICD-10-CM

## 2014-09-09 DIAGNOSIS — G309 Alzheimer's disease, unspecified: Secondary | ICD-10-CM

## 2014-09-09 DIAGNOSIS — F0280 Dementia in other diseases classified elsewhere without behavioral disturbance: Secondary | ICD-10-CM

## 2014-09-09 DIAGNOSIS — I481 Persistent atrial fibrillation: Secondary | ICD-10-CM | POA: Diagnosis not present

## 2014-09-09 DIAGNOSIS — D61818 Other pancytopenia: Secondary | ICD-10-CM | POA: Diagnosis not present

## 2014-09-09 DIAGNOSIS — R296 Repeated falls: Secondary | ICD-10-CM

## 2014-09-09 DIAGNOSIS — I4819 Other persistent atrial fibrillation: Secondary | ICD-10-CM

## 2014-09-09 DIAGNOSIS — M6281 Muscle weakness (generalized): Secondary | ICD-10-CM | POA: Diagnosis not present

## 2014-09-09 MED ORDER — FENTANYL 12 MCG/HR TD PT72: 12.5000 ug | MEDICATED_PATCH | TRANSDERMAL | Status: DC

## 2014-09-09 NOTE — Patient Instructions (Addendum)
F/u in 8 weeks ,call if you need me before  Medication is to be taken as listed.  Tramadol for pain is only once daily  You need to talk with cardiologist about pacerone since you do not have this, and lasix since you have not been taking this . We have your lasix and potassium here and will keep for 2 days until w hear from you  Or cardiology    Happy birthday  Nurse visit to be arranged to direct medication management

## 2014-09-09 NOTE — Progress Notes (Signed)
HPI: in a [name] is an 80 year old patient of Dr. Crissie Sickles, that we follow for ongoing assessment and management of chronic systolic heart failure, nonischemic cardiomyopathy, hypertension, atrial fibrillation, status post [employer] BiV  pacemaker insertion.  He was last seen in the office on 08/18/2014 on hospital followup where he was admitted with an upper GI bleed and heart failure. He was taken off of anticoagulation therapy. He was continued on amiodarone therapy for rate control. Colonoscopy did not reveal any active bleeding. On followup visit. He was ordered. A CBC and a BMET. He was also found to be mildly hypotensive and we may consider discontinuing amlodipine if he is symptomatic. He was found to be very sedentary at the time of last office visit.  followup labs demonstrate sodium 140, potassium 4.3, chloride 104, CO2 26, creatinine 1.16. Hemoglobin was 9.7, hematocrit 28.8, platelets 127.  Since being seen last he stopped taking lasix on his own. Stated that it makes him go to the bathroom too much. He stopped the potassium as well. He continues OP rehab. A nurse also comes to visit once a week.   He has seen Dr. Moshe Cipro who wanted clarification on his medications. He has mild dementia and is uncertain sometimes of what he is taking. He knows the pills by color.   Allergies  Allergen Reactions  . Codeine Diarrhea  . Penicillins     Other reaction(s): OTHER    Current Outpatient Prescriptions  Medication Sig Dispense Refill  . amiodarone (PACERONE) 200 MG tablet Take 1 tablet (200 mg total) by mouth daily. 30 tablet 3  . amLODipine (NORVASC) 2.5 MG tablet TAKE 1 TABLET (2.5 MG TOTAL) BY MOUTH DAILY. 90 tablet 1  . donepezil (ARICEPT) 5 MG tablet Take 1 tablet (5 mg total) by mouth at bedtime. 30 tablet 4  . dorzolamide-timolol (COSOPT) 22.3-6.8 MG/ML ophthalmic solution Place 1 drop into both eyes 2 (two) times daily.     . fentaNYL (DURAGESIC - DOSED MCG/HR) 12 MCG/HR  Place 12.5 mcg onto the skin every 3 (three) days.    . fentaNYL (DURAGESIC - DOSED MCG/HR) 12 MCG/HR Place 1 patch (12.5 mcg total) onto the skin every 3 (three) days. 10 patch 0  . latanoprost (XALATAN) 0.005 % ophthalmic solution Place 1 drop into both eyes at bedtime.     Marland Kitchen omeprazole (PRILOSEC) 20 MG capsule Take 20 mg by mouth 2 (two) times daily before a meal.    . silodosin (RAPAFLO) 8 MG CAPS capsule Take 8 mg by mouth daily with breakfast.    . traMADol (ULTRAM) 50 MG tablet Once daily as needed, for  uncontrolled pain 30 tablet 0  . hydrochlorothiazide (HYDRODIURIL) 25 MG tablet Take 1 tablet (25 mg total) by mouth daily. 90 tablet 3   No current facility-administered medications for this visit.    Past Medical History  Diagnosis Date  . Chronic systolic heart failure   . Rash and other nonspecific skin eruption   . Costochondritis, acute   . Glaucoma   . Erectile dysfunction   . Visual changes   . Pancytopenia   . Chronic pancreatitis     Based on CT findings  . History of melanoma in situ     JAN 2012--  LEFT HEEL W/ SLN BX  . Chronic back pain   . IC (interstitial cystitis)   . BPH (benign prostatic hypertrophy)   . Cardiac pacemaker     CARDIOLOGIST-- DR Cristopher Peru (LAST PACE CHECK  05-15-2013)  . Asymptomatic carotid artery stenosis     BILATERAL MILD ICA---  <50% PER DUPLEX 03-08-2008  . History of syncope   . Hypertension   . CHB (complete heart block)   . Nonischemic cardiomyopathy   . Severe mitral regurgitation   . Urge urinary incontinence   . A-fib   . Dementia     Past Surgical History  Procedure Laterality Date  . Cataract extraction, bilateral  left  1997/   right 2003  . Pacemaker insertion  12-06-2008  DR GREGG TAYLOR    BiV PPM  --  [employer]  . Colonoscopy  08/18/2006    FBP:ZWCH granularity and erosions of the rectum of uncertain significance biopsied.  A long redundant, but otherwise normal-appearing colon.melanosi coli and minimal  inflammation on bx  . Inguinal hernia repair Bilateral AS TEEN  . Interstim implant placement  2001  &  2007  . Cysto/ hod/  replacement interstim implant  05-14-2003  . Removal interstim implant/  cyst/  hod  04-14-2004  . Revision interstim implant and replace generator  05-15-2009    left upper buttock for urge urinary incontinence  . Wide excision left heel and sln bx  JUNE 2012  . Tonsillectomy  1950  . Left elbow surgery  2002  . Back surgery  X3  LAST ONE'90's  . Transthoracic echocardiogram  12-06-2008    LVSF 55-60%/  MODERATE MR/  MILD AR/  QUESTION DISTAL POSTERIOR HYPOKINESIS  . Interstim implant revision N/A 08/28/2013    Procedure: REPLACEMENT OF INTERSTIM-GENERATOR AND LEAD ;  Surgeon: [name], MD;  Location: [facility];  Service: Urology;  Laterality: N/A;  . Esophagogastroduodenoscopy N/A 03/28/2014    Dr. [name]: normal esophagus s/p dilation, mosaic appearance - chronic inflammation noted on bx  . Savory dilation N/A 03/28/2014    Procedure: SAVORY DILATION;  Surgeon: [name], MD;  Location: [facility];  Service: Endoscopy;  Laterality: N/A;  Venia Minks dilation N/A 03/28/2014    Procedure: Venia Minks DILATION;  Surgeon: [name], MD;  Location: [facility];  Service: Endoscopy;  Laterality: N/A;  . Colonoscopy N/A 08/05/2014    Procedure: COLONOSCOPY;  Surgeon: [name], MD;  Location: [facility];  Service: Endoscopy;  Laterality: N/A;  PT NEEDS TCS AT 1000.    ROS:  Complete review of systems performed and found to be negative unless outlined above  PHYSICAL EXAM BP 140/88 mmHg  Pulse 66  Ht 5\' 11"  (1.803 m)  Wt 158 lb 9.6 oz (71.94 kg)  BMI 22.13 kg/m2 General: Well developed, well nourished, in no acute distress Head: Eyes PERRLA, No xanthomas.   Normal cephalic and atramatic  Lungs: Clear bilaterally to auscultation and percussion. Heart: HRRR S1 S2, without MRG.  Pulses are 2+ & equal.            No carotid  bruit. No JVD.  No abdominal bruits. No femoral bruits. Abdomen: Bowel sounds are positive, abdomen soft and non-tender without masses or                  Hernia's noted. Msk:  Back normal, normal gait. Normal strength and tone for age. Extremities: No clubbing, cyanosis or 2+ pitting edema.  DP +1 Neuro: Alert and oriented X 3. Psych:  Good affect, responds appropriately  ASSESSMENT AND PLAN

## 2014-09-09 NOTE — Progress Notes (Signed)
   Subjective:    Patient ID: [name], male    DOB: [birth year], 80 y.o.   MRN: [id number]  HPI  Pt in for f/u following recent hospitalization for GI bleed followed by SNF placement for rehabilitation, and strengthening. He had been on an anticoagulant  For  A fib, however this is nor discontinued as pt is high fall risk , has had multiple falls, and was hospitalized on 10/30 anemic with a large hematoma on buttock Was d/c from [facility] 11/20 and presents for f/u Looks much improved as compared  With last visit. States feet feel stronger and so does he. Appetite is fairly good and wife denies behavioral issues at this time   Review of Systems See HPI Denies recent fever or chills. Denies sinus pressure, nasal congestion, ear pain or sore throat. Denies chest congestion, productive cough or wheezing. Denies chest pains, palpitations and leg swelling Denies abdominal pain, nausea, vomiting,diarrhea or constipation.   Denies dysuria, frequency, hesitancy or incontinence. Chronic c/o joint pain,  and limitation in mobility. Denies headaches, seizures, Denies depression, anxiety or insomnia. Denies skin break down or rash.        Objective:   Physical Exam  BP 120/78 mmHg  Pulse 62  Resp 16  Ht 5\' 11"  (1.803 m)  Wt 158 lb 6.4 oz (71.85 kg)  BMI 22.10 kg/m2  SpO2 95%  Patient alert and oriented and in no cardiopulmonary distress.  HEENT: No facial asymmetry, EOMI,   oropharynx pink and moist.  Neck decreased ROM, no JVD  Chest: Clear to auscultation bilaterally.  CVS: S1, S2 no murmurs, no S3.Regular rate.  ABD: Soft non tender.   Ext: No edema  MS: Decreased  ROM spine, shoulders, hips and knees.  Skin: Intact, no ulcerations or rash noted.No bruises  Psych: Good eye contact, normal affect. Memory impaired not anxious or depressed appearing.  CNS: CN 2-12 intact, power,  normal throughout.no focal deficits noted.       Assessment & Plan:  Essential  hypertension Controlled, no change in medication    Chronic systolic heart failure Clinically stable and asymptomatic, continue current management, followed by cardiology also   Atrial fibrillation, persistent Rate controlled on current therapy, anticoagullation is contraindicated   Alzheimer's dementia Should resume aricept which he ahs taken in the past will f/u on this   Bilateral leg weakness Improved with recent in house therapy, he is to continue out pt in home PT/OT  For next approx 4 weeks   Recurrent falls Home safety issues reviewed with pt and spouse

## 2014-09-10 ENCOUNTER — Encounter: Payer: Self-pay | Admitting: Adult Health

## 2014-09-10 ENCOUNTER — Ambulatory Visit (INDEPENDENT_AMBULATORY_CARE_PROVIDER_SITE_OTHER): Payer: Medicare Other | Admitting: Adult Health

## 2014-09-10 DIAGNOSIS — I5022 Chronic systolic (congestive) heart failure: Secondary | ICD-10-CM

## 2014-09-10 MED ORDER — HYDROCHLOROTHIAZIDE 25 MG PO TABS: 25.0000 mg | ORAL_TABLET | Freq: Every day | ORAL | Status: DC

## 2014-09-10 MED ORDER — POTASSIUM CHLORIDE ER 10 MEQ PO TBCR: 20.0000 meq | EXTENDED_RELEASE_TABLET | Freq: Every day | ORAL | Status: DC

## 2014-09-10 NOTE — Assessment & Plan Note (Signed)
I will keep him on amiodarone as he is tolerating this and continues excellent rate control. He will stay off of anticoagulation due to GI bleed requiring blood transfusions.

## 2014-09-10 NOTE — Assessment & Plan Note (Signed)
Follow up appt with Dr. Lovena Le planned for March of 2016.

## 2014-09-10 NOTE — Patient Instructions (Addendum)
Your physician wants you to follow-up in: 3 months   STAY ON Amiodarone 200 mg daily  START HCTZ 25 mg daily  START Potassium 20 meq daily  Get lab work Artist) in 1 week    Thank you for choosing [facility] !

## 2014-09-10 NOTE — Assessment & Plan Note (Signed)
He has some LEE, but refuses lasix currently. I will start him on HCTZ 25 mg daily and restart potassium. Follow up BMET in one week.

## 2014-09-10 NOTE — Progress Notes (Deleted)
Name: [name]    DOB: 05-May-1926  Age: 80 y.o.  MR#: [id number]       PCP:  [name], MD      Insurance: Payor: MEDICARE / Plan: MEDICARE PART A AND B / Product Type: *No Product type* /   CC:    Chief Complaint  Patient presents with  . Congestive Heart Failure  . Atrial Fibrillation  . Cardiomyopathy    VS Filed Vitals:   09/10/14 1307  BP: 140/88  Pulse: 66  Height: $Remove'5\' 11"'VMVrRyg$  (1.803 m)  Weight: 158 lb 9.6 oz (71.94 kg)    Weights Current Weight  09/10/14 158 lb 9.6 oz (71.94 kg)  09/09/14 158 lb 6.4 oz (71.85 kg)  08/08/14 163 lb (73.936 kg)    Blood Pressure  BP Readings from Last 3 Encounters:  09/10/14 140/88  09/09/14 120/78  08/24/14 128/82     Admit date:  (Not on file) Last encounter with RMR:  08/08/2014   Allergy Codeine and Penicillins  Current Outpatient Prescriptions  Medication Sig Dispense Refill  . amiodarone (PACERONE) 200 MG tablet Take 1 tablet (200 mg total) by mouth daily. 30 tablet 3  . amLODipine (NORVASC) 2.5 MG tablet TAKE 1 TABLET (2.5 MG TOTAL) BY MOUTH DAILY. 90 tablet 1  . donepezil (ARICEPT) 5 MG tablet Take 1 tablet (5 mg total) by mouth at bedtime. 30 tablet 4  . dorzolamide-timolol (COSOPT) 22.3-6.8 MG/ML ophthalmic solution Place 1 drop into both eyes 2 (two) times daily.     . fentaNYL (DURAGESIC - DOSED MCG/HR) 12 MCG/HR Place 12.5 mcg onto the skin every 3 (three) days.    . fentaNYL (DURAGESIC - DOSED MCG/HR) 12 MCG/HR Place 1 patch (12.5 mcg total) onto the skin every 3 (three) days. 10 patch 0  . latanoprost (XALATAN) 0.005 % ophthalmic solution Place 1 drop into both eyes at bedtime.     Marland Kitchen omeprazole (PRILOSEC) 20 MG capsule Take 20 mg by mouth 2 (two) times daily before a meal.    . silodosin (RAPAFLO) 8 MG CAPS capsule Take 8 mg by mouth daily with breakfast.    . traMADol (ULTRAM) 50 MG tablet Once daily as needed, for  uncontrolled pain 30 tablet 0   No current facility-administered medications for this visit.     Discontinued Meds:   There are no discontinued medications.  Patient Active Problem List   Diagnosis Date Noted  . Cerumen impaction 08/11/2014  . Acute upper GI bleeding 08/02/2014  . Recurrent falls 08/01/2014  . Hand pain, left 08/01/2014  . Left hip pain 08/01/2014  . Anemia due to other cause 08/01/2014  . Abrasion of hand, left 08/01/2014  . Need for vaccination with 13-polyvalent pneumococcal conjugate vaccine 05/26/2014  . Dysphagia, unspecified(787.20) 03/19/2014  . Chest pain 02/22/2014  . Atrial fibrillation, persistent 02/22/2014  . Encounter for medication management 11/10/2013  . Abnormal weight loss 08/09/2013  . Anorexia nervosa 08/09/2013  . Heme positive stool 07/22/2013  . Leg pain, bilateral 07/07/2013  . Bilateral leg weakness 05/01/2013  . Postural imbalance 05/01/2013  . Routine general medical examination at a health care facility 11/06/2012  . Laceration of finger of left hand 10/12/2012  . Nocturia 07/10/2012  . Difficulty walking 06/12/2012  . Difficulty in walking 06/12/2012  . Abnormal gait 06/12/2012  . Lower extremity weakness 05/31/2012  . GERD (gastroesophageal reflux disease) 05/09/2012  . Change in bowel habits 04/19/2012  . Alzheimer's dementia 11/17/2011  . Malignant melanoma 03/03/2011  .  Backache 02/18/2010  . GEN OSTEOARTHROSIS INVOLVING MULTIPLE SITES 11/25/2009  . CENTRAL HEARING LOSS 11/10/2009  . CARDIAC PACEMAKER IN SITU 07/22/2009  . INTERSTITIAL CYSTITIS 07/14/2009  . Pancytopenia 12/31/2008  . MITRAL REGURGITATION, MODERATE 12/31/2008  . Chronic systolic heart failure 83/66/2947  . FATIGUE 12/05/2008  . SYNCOPE 05/03/2008  . ERECTILE DYSFUNCTION 01/31/2008  . GLAUCOMA 01/31/2008  . Essential hypertension 01/31/2008    LABS    Component Value Date/Time   NA 140 08/04/2014 0622   NA 144 08/03/2014 0641   NA 143 08/02/2014 0948   K 4.3 08/04/2014 0622   K 3.9 08/03/2014 0641   K 3.9 08/02/2014 0948   CL 104  08/04/2014 0622   CL 106 08/03/2014 0641   CL 107 08/02/2014 0948   CO2 26 08/04/2014 0622   CO2 28 08/03/2014 0641   CO2 28 08/02/2014 0948   GLUCOSE 91 08/04/2014 0622   GLUCOSE 89 08/03/2014 0641   GLUCOSE 115* 08/02/2014 0948   BUN 23 08/04/2014 0622   BUN 24* 08/03/2014 0641   BUN 29* 08/02/2014 0948   CREATININE 1.16 08/04/2014 0622   CREATININE 1.22 08/03/2014 0641   CREATININE 1.40* 08/02/2014 0948   CREATININE 1.31 08/01/2014 1534   CREATININE 0.94 06/19/2013 1241   CREATININE 1.01 04/25/2013 1655   CALCIUM 8.2* 08/04/2014 0622   CALCIUM 8.3* 08/03/2014 0641   CALCIUM 8.4 08/02/2014 0948   GFRNONAA 56* 08/04/2014 0622   GFRNONAA 52* 08/03/2014 0641   GFRNONAA 44* 08/02/2014 0948   GFRAA 64* 08/04/2014 0622   GFRAA 61* 08/03/2014 0641   GFRAA 51* 08/02/2014 0948   CMP     Component Value Date/Time   NA 140 08/04/2014 0622   K 4.3 08/04/2014 0622   CL 104 08/04/2014 0622   CO2 26 08/04/2014 0622   GLUCOSE 91 08/04/2014 0622   BUN 23 08/04/2014 0622   CREATININE 1.16 08/04/2014 0622   CREATININE 1.31 08/01/2014 1534   CALCIUM 8.2* 08/04/2014 0622   PROT 5.6* 08/04/2014 0622   ALBUMIN 2.9* 08/04/2014 0622   AST 18 08/04/2014 0622   ALT 12 08/04/2014 0622   ALKPHOS 52 08/04/2014 0622   BILITOT 1.9* 08/04/2014 0758   GFRNONAA 56* 08/04/2014 0622   GFRAA 64* 08/04/2014 0622       Component Value Date/Time   WBC 2.4* 08/04/2014 0622   WBC 2.4* 08/03/2014 0641   WBC 1.9* 08/02/2014 0948   HGB 9.7* 08/04/2014 0622   HGB 9.4* 08/03/2014 0641   HGB 7.6* 08/02/2014 0948   HCT 28.8* 08/04/2014 0622   HCT 28.2* 08/03/2014 0641   HCT 23.0* 08/02/2014 0948   MCV 96.3 08/04/2014 0622   MCV 95.6 08/03/2014 0641   MCV 98.7 08/02/2014 0948    Lipid Panel     Component Value Date/Time   CHOL 179 06/19/2013 1241   TRIG 66 06/19/2013 1241   HDL 51 06/19/2013 1241   CHOLHDL 3.5 06/19/2013 1241   VLDL 13 06/19/2013 1241   LDLCALC 115* 06/19/2013 1241     ABG No results found for: PHART, PCO2ART, PO2ART, HCO3, TCO2, ACIDBASEDEF, O2SAT   Lab Results  Component Value Date   TSH 1.805 04/25/2013   BNP (last 3 results) No results for input(s): PROBNP in the last 8760 hours. Cardiac Panel (last 3 results) No results for input(s): CKTOTAL, CKMB, TROPONINI, RELINDX in the last 72 hours.  Iron/TIBC/Ferritin/ %Sat    Component Value Date/Time   IRON 57 08/01/2014 1534   TIBC 325 02/22/2011  [place] 08/01/2014 1534   IRONPCTSAT 6* 02/22/2011 1702     EKG Orders placed or performed during the hospital encounter of 08/02/14  . ED EKG  . ED EKG  . EKG 12-Lead  . EKG 12-Lead  . EKG     Prior Assessment and Plan Problem List as of 09/10/2014      Cardiovascular and Mediastinum   MITRAL REGURGITATION, MODERATE   Last Assessment & Plan   08/08/2014 Office Visit Written 08/08/2014  4:06 PM by Lendon Colonel, NP    No apparent for repair or surgery at this time. Multiple comorbidities. Continue medical management.    Essential hypertension   Last Assessment & Plan   08/08/2014 Office Visit Written 08/08/2014  4:05 PM by Lendon Colonel, NP    Blood pressure is low normal. He is very sedentary. Will not make any changes currently on this medication regimen. He continues generalized weakness, or fatigue. He may need to discontinue amlodipine. We will see again in one month once he is more active after physical therapy and reassess. Do not want him falling again, due to hypotension.    Chronic systolic heart failure   Last Assessment & Plan   08/08/2014 Office Visit Written 08/08/2014  4:07 PM by Lendon Colonel, NP    Lungs are clear be has dependent edema, bilaterally. I have advised him on low sodium diet in place that on his orders to return to the 10 cm to make sure that he is eating over salty foods.he verbalizes understanding.    SYNCOPE   Atrial fibrillation, persistent   Last Assessment & Plan   08/08/2014  Office Visit Edited 08/08/2014  4:07 PM by Lendon Colonel, NP    He has been taken off of the relative secondary to GI bleed, frequent falls, with hematoma and bruising noted. Most recently on hospitalization. Heart rate is well-controlled. He remains on amiodarone. He denies any palpitations, or heart racing at this time. He has not been placed on aspirin. He he still has considerable amount of bruising on his arms and legs. We will continue him on current medication regimen without changes. We will see him again in a month's for ongoing management.he will have a BMET, and a CBC in one week.      Digestive   GERD (gastroesophageal reflux disease)   Last Assessment & Plan   09/03/2013 Office Visit Written 09/09/2013  2:31 PM by Fayrene Helper, MD    Controlled, no change in medication Followed by GI    Dysphagia, unspecified(787.20)   Last Assessment & Plan   03/19/2014 Office Visit Written 03/21/2014 11:36 PM by Orvil Feil, NP    80 year old male with new-onset dysphagia. Vague historian but wife does note patient complains of hoarseness and notes his voice has changed. Father with history of throat cancer. Will proceed with upper endoscopy to rule out occult process; recommend subsequent ENT evaluation of negative EGD.   Proceed with upper endoscopy and dilation in the near future with Dr. [name]. The risks, benefits, and alternatives have been discussed in detail with patient. They have stated understanding and desire to proceed.  ENT referral if negative EGD    Acute upper GI bleeding     Nervous and Auditory   CENTRAL HEARING LOSS   Last Assessment & Plan   12/28/2013 Office Visit Written 12/29/2013  9:11 AM by Fayrene Helper, MD    needs hearing aids, will advise checking  belltone  For help with this    Alzheimer's dementia   Last Assessment & Plan   08/01/2014 Office Visit Written 08/01/2014 10:19 PM by Fayrene Helper, MD    rept MMSE needs, and hisdose of aricept needs  to be increased at next visit    Lower extremity weakness   Last Assessment & Plan   11/09/2013 Office Visit Written 11/10/2013  3:15 PM by Fayrene Helper, MD    Improved following recent physical therapy sessions, also ahs not been using pain medication regularly    Bilateral leg weakness   Last Assessment & Plan   12/28/2013 Office Visit Written 12/29/2013  9:09 AM by Fayrene Helper, MD    Continued c/o lower extremity weakness, no falls, no interest in therapy currently    Cerumen impaction     Musculoskeletal and Integument   GEN OSTEOARTHROSIS INVOLVING MULTIPLE SITES   Last Assessment & Plan   05/20/2014 Office Visit Written 05/26/2014  5:43 PM by Fayrene Helper, MD    Ongoing chronic pain in multiple joints with limitation in mobility Pt to use fenatnyl q 3 days 4mg dose , discouraged from continuing tramadol along with this    Abrasion of hand, left   Last Assessment & Plan   08/01/2014 Office Visit Written 08/01/2014 10:19 PM by MFayrene Helper MD    H/o hand trauma with slow healing and persistent scab, antibiotic ointment given for twice daily use fo next 5 to 7 days, if persists will need dermatology eval, had multiple myeloma on his foot which presented in a similar fashion      Genitourinary   INTERSTITIAL CYSTITIS   Last Assessment & Plan   11/09/2013 Office Visit Written 11/10/2013  3:17 PM by MFayrene Helper MD    C/o nocturia which disturbs his sleep, no improvement with recent procedure reportedly. I have advised him to try half valium at bedtime, urology had prescribed 3 times daily, he has not been taking this, and I suspect he would be unable to tolerate that high a dose. UA in the office is negative for infection. F/U with urology is to be arranged      Hematopoietic and Hemostatic   Pancytopenia   Last Assessment & Plan   03/01/2013 Office Visit Written 03/03/2013  3:04 PM by MFayrene Helper MD    Improved numbers, followed by hematology       Other   ERECTILE DYSFUNCTION   Last Assessment & Plan   05/10/2011 Office Visit Written 05/10/2011 10:00 AM by MFayrene Helper MD    Pt states that the cialis is not working as well, he still has a lot, advised him to discuss further with his urologist    [place]  09/03/2013 Office Visit Written 09/09/2013  2:35 PM by MFayrene Helper MD    The importance of regular eye exam and compliance with treatment is stressed    Backache   Last Assessment & Plan   11/09/2013 Office Visit Written 11/10/2013  3:18 PM by MFayrene Helper MD    Improved with recent therapy. Will use tramadol 50 mg one daily, only if needed    FATIGUE   Last Assessment & Plan   03/01/2013 Office Visit Written 03/03/2013  3:03 PM by MFayrene Helper MD    Worsened since acute illness    CARDIAC PACEMAKER IN SITU   Last Assessment & Plan   07/05/2014 Office Visit  Written 07/05/2014  9:58 AM by [name], MD    His Medtronic BiV, DDD PM is working normally. Will recheck in 3 months. Hopefully he will be back in NSR.    Malignant melanoma   Last Assessment & Plan   05/10/2011 Office Visit Written 05/12/2011  8:50 AM by Fayrene Helper, MD    Followed locally as well as at [facility] in bowel habits   Last Assessment & Plan   04/19/2012 Office Visit Written 04/19/2012 11:44 AM by Fayrene Helper, MD    6 to 10 month h/o change in bM and weight loss    Difficulty walking   Last Assessment & Plan   05/20/2014 Office Visit Written 05/26/2014  5:45 PM by Fayrene Helper, MD    Improved following PT    Difficulty in walking   Last Assessment & Plan   07/10/2012 Office Visit Written 07/10/2012 11:16 AM by Fayrene Helper, MD    Improved with therapy x 4 sessions, stopped stating his back was hurting more    Abnormal gait   Nocturia   Last Assessment & Plan   12/28/2013 Office Visit Written 12/29/2013  9:07 AM by Fayrene Helper, MD    Anxiety, poor sleep and  frequent urination at night, no infection in urine. Trial of amitriptyline low dose    Laceration of finger of left hand   Last Assessment & Plan   10/12/2012 Office Visit Written 11/06/2012  9:50 AM by Fayrene Helper, MD    Open wound on finger no obvious infection, topical antibiotic, and area to be kept clean Tdap at visit, due    Routine general medical examination at a health care facility   Last Assessment & Plan   10/12/2012 Office Visit Written 11/06/2012  9:52 AM by Fayrene Helper, MD    Annual wellness completed as documented. Pt needs more complete memory eval at next visit, even though the mini cog was normal, he has been on medication for memory. He is to review the MOST from with his children, he is a full code, but may wish modification of treatment offered. 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 Was not able to undergo colonoscopy because he did not refrain from eating solid food the day before his procedure. His constipation has resolved. His appetite has improved. He has gained 3 pounds. At this time he is not interested in pursuing a colonoscopy or upper endoscopy. We discussed that his Hemoccult-positive stool may be completely benign finding from benign anorectal disease but cannot exclude possibility of advanced polyp or malignancy. He voiced understanding that delay in workup may negatively affect his outcome. He is adamant about avoiding endoscopic evaluation. I note that he is most recent hemoglobin in November was very similar to that over the past several years.   Repeat CBC  in April. Further recommendations at that point.    Abnormal weight loss   Anorexia nervosa   Encounter for medication management   Last Assessment &  Plan   11/09/2013 Office Visit Written 11/10/2013  3:22 PM by Fayrene Helper, MD    Pt has all meds today, he and his wife have allowed me to get rid of all the tablets/ meds he is not to take. The current list reflects he recmmended meds as of this visit. Only variation may be in his eye drops, which he has not brought physically today    Chest pain   Last Assessment & Plan   02/22/2014 Office Visit Written 02/22/2014 11:36 AM by [name], MD    The etiology is unclear. It has resolved. As he has not been thought to have CAD in the past, I am not inclined to work up again. We will give him ntg.    Need for vaccination with 13-polyvalent pneumococcal conjugate vaccine   Last Assessment & Plan   05/20/2014 Office Visit Written 05/26/2014  5:44 PM by Fayrene Helper, MD    Vaccine administered    Recurrent falls   Last Assessment & Plan   08/01/2014 Office Visit Written 08/01/2014 10:06 PM by Fayrene Helper, MD    Needs evaluation for [place], also more involvement by patient's son/daughter , pt states house is cluttered , will follow through on both    Hand pain, left   Last Assessment & Plan   08/01/2014 Office Visit Written 08/01/2014 10:05 PM by Fayrene Helper, MD    Left hand pain and bruising s/p recurrent falls, abnormal xray of hand, needs further eval by ortho, may have ligamentous injury    Left hip pain   Last Assessment & Plan   08/01/2014 Office Visit Written 08/01/2014 10:08 PM by Fayrene Helper, MD    Left hip pain s/p fall, xray obsained negative for fracture, pt does have extensive bruise from left hip down posterior thigh to knee    Anemia due to other cause   Last Assessment & Plan   08/01/2014 Office Visit Written 08/01/2014 10:16 PM by Fayrene Helper, MD    Pt to return in am for testing for heme  positive stool as e is on xarelto, need to ensure bleed is not internal as well as the obvious bruise from recurrent trauma If heme neg, will transfuse as out pt ,  I called and explained to his spouse to bring him to office for further management, and arrangements have been started for outpatient  transfusion        Imaging: Dg Chest 1 View  08/13/2014   CLINICAL DATA:  Cough and congestion  EXAM: CHEST - 1 VIEW  COMPARISON:  08/02/2014  FINDINGS: Cardiac enlargement. Progression of right upper lobe airspace disease likely edema however pneumonia not excluded. There is vascular congestion. Minimal pleural effusion.  Cardiac enlargement with pacemaker leads unchanged.  IMPRESSION: Progression of right upper lobe airspace disease likely pulmonary edema however pneumonia not excluded.  Cardiac enlargement with vascular congestion.   Electronically Signed   By: Franchot Gallo M.D.   On: 08/13/2014 10:49   Dg Elbow Complete Right  08/22/2014   CLINICAL DATA:  Right elbow pain for 1 year, history of fracture  EXAM: RIGHT ELBOW - COMPLETE 3+ VIEW  COMPARISON:  None.  FINDINGS: Four views of right elbow submitted. No acute fracture or subluxation. No posterior fat pad sign. No radiopaque foreign body.  IMPRESSION: Negative.   Electronically Signed   By: Lahoma Crocker M.D.   On: 08/22/2014 14:45   US Venous Img Lower Unilateral  Right  08/23/2014   CLINICAL DATA:  Right lower extremity swelling  EXAM: Right LOWER EXTREMITY VENOUS DOPPLER ULTRASOUND  TECHNIQUE: Gray-scale sonography with graded compression, as well as color Doppler and duplex ultrasound were performed to evaluate the lower extremity deep venous systems from the level of the common femoral vein and including the common femoral, femoral, profunda femoral, popliteal and calf veins including the posterior tibial, peroneal and gastrocnemius veins when visible. The superficial great saphenous vein was also interrogated. Spectral Doppler was utilized  to evaluate flow at rest and with distal augmentation maneuvers in the common femoral, femoral and popliteal veins.  COMPARISON:  None.  FINDINGS: Contralateral Common Femoral Vein: Respiratory phasicity is normal and symmetric with the symptomatic side. No evidence of thrombus. Normal compressibility.  Common Femoral Vein: No evidence of thrombus. Normal compressibility, respiratory phasicity and response to augmentation.  Saphenofemoral Junction: No evidence of thrombus. Normal compressibility and flow on color Doppler imaging.  Profunda Femoral Vein: No evidence of thrombus. Normal compressibility and flow on color Doppler imaging.  Femoral Vein: No evidence of thrombus. Normal compressibility, respiratory phasicity and response to augmentation.  Popliteal Vein: No evidence of thrombus. Normal compressibility, respiratory phasicity and response to augmentation.  Calf Veins: No evidence of thrombus. Normal compressibility and flow on color Doppler imaging.  Superficial Great Saphenous Vein: No evidence of thrombus. Normal compressibility and flow on color Doppler imaging.  Other Findings:  Subcutaneous edema of the lower extremity.  IMPRESSION: No evidence of right lower extremity deep venous thrombosis.  Subcutaneous edema in the right lower extremity   Electronically Signed   By: Suzy Bouchard M.D.   On: 08/23/2014 18:27

## 2014-09-10 NOTE — Assessment & Plan Note (Signed)
Elevated today. Adding back diuretic will be helpful.

## 2014-09-12 DIAGNOSIS — D61818 Other pancytopenia: Secondary | ICD-10-CM | POA: Diagnosis not present

## 2014-09-12 DIAGNOSIS — I5022 Chronic systolic (congestive) heart failure: Secondary | ICD-10-CM | POA: Diagnosis not present

## 2014-09-12 DIAGNOSIS — M6281 Muscle weakness (generalized): Secondary | ICD-10-CM | POA: Diagnosis not present

## 2014-09-12 DIAGNOSIS — K929 Disease of digestive system, unspecified: Secondary | ICD-10-CM | POA: Diagnosis not present

## 2014-09-13 DIAGNOSIS — M6281 Muscle weakness (generalized): Secondary | ICD-10-CM | POA: Diagnosis not present

## 2014-09-13 DIAGNOSIS — I5022 Chronic systolic (congestive) heart failure: Secondary | ICD-10-CM | POA: Diagnosis not present

## 2014-09-13 DIAGNOSIS — K929 Disease of digestive system, unspecified: Secondary | ICD-10-CM | POA: Diagnosis not present

## 2014-09-13 DIAGNOSIS — D61818 Other pancytopenia: Secondary | ICD-10-CM | POA: Diagnosis not present

## 2014-09-16 ENCOUNTER — Ambulatory Visit: Payer: Medicare Other | Admitting: Family Medicine

## 2014-09-17 DIAGNOSIS — I5022 Chronic systolic (congestive) heart failure: Secondary | ICD-10-CM | POA: Diagnosis not present

## 2014-09-17 DIAGNOSIS — D61818 Other pancytopenia: Secondary | ICD-10-CM | POA: Diagnosis not present

## 2014-09-17 DIAGNOSIS — K929 Disease of digestive system, unspecified: Secondary | ICD-10-CM | POA: Diagnosis not present

## 2014-09-17 DIAGNOSIS — M6281 Muscle weakness (generalized): Secondary | ICD-10-CM | POA: Diagnosis not present

## 2014-09-18 LAB — BASIC METABOLIC PANEL
BUN: 20 mg/dL (ref 6–23)
CO2: 31 meq/L (ref 19–32)
Calcium: 8.3 mg/dL — ABNORMAL LOW (ref 8.4–10.5)
Chloride: 102 mEq/L (ref 96–112)
Creat: 1.16 mg/dL (ref 0.50–1.35)
GLUCOSE: 73 mg/dL (ref 70–99)
Potassium: 3.5 mEq/L (ref 3.5–5.3)
Sodium: 140 mEq/L (ref 135–145)

## 2014-09-19 DIAGNOSIS — D61818 Other pancytopenia: Secondary | ICD-10-CM | POA: Diagnosis not present

## 2014-09-19 DIAGNOSIS — I5022 Chronic systolic (congestive) heart failure: Secondary | ICD-10-CM | POA: Diagnosis not present

## 2014-09-19 DIAGNOSIS — K929 Disease of digestive system, unspecified: Secondary | ICD-10-CM | POA: Diagnosis not present

## 2014-09-19 DIAGNOSIS — M6281 Muscle weakness (generalized): Secondary | ICD-10-CM | POA: Diagnosis not present

## 2014-09-30 DIAGNOSIS — D61818 Other pancytopenia: Secondary | ICD-10-CM | POA: Diagnosis not present

## 2014-09-30 DIAGNOSIS — M6281 Muscle weakness (generalized): Secondary | ICD-10-CM | POA: Diagnosis not present

## 2014-09-30 DIAGNOSIS — I5022 Chronic systolic (congestive) heart failure: Secondary | ICD-10-CM | POA: Diagnosis not present

## 2014-09-30 DIAGNOSIS — K929 Disease of digestive system, unspecified: Secondary | ICD-10-CM | POA: Diagnosis not present

## 2014-10-01 ENCOUNTER — Encounter: Payer: Self-pay | Admitting: Family Medicine

## 2014-10-02 DIAGNOSIS — M6281 Muscle weakness (generalized): Secondary | ICD-10-CM | POA: Diagnosis not present

## 2014-10-02 DIAGNOSIS — D61818 Other pancytopenia: Secondary | ICD-10-CM | POA: Diagnosis not present

## 2014-10-02 DIAGNOSIS — K929 Disease of digestive system, unspecified: Secondary | ICD-10-CM | POA: Diagnosis not present

## 2014-10-02 DIAGNOSIS — I5022 Chronic systolic (congestive) heart failure: Secondary | ICD-10-CM | POA: Diagnosis not present

## 2014-10-07 ENCOUNTER — Other Ambulatory Visit: Payer: Self-pay

## 2014-10-07 ENCOUNTER — Encounter: Payer: Self-pay | Admitting: Internal Medicine

## 2014-10-07 ENCOUNTER — Encounter: Payer: Self-pay | Admitting: *Deleted

## 2014-10-07 ENCOUNTER — Ambulatory Visit (INDEPENDENT_AMBULATORY_CARE_PROVIDER_SITE_OTHER): Payer: Medicare Other | Admitting: Internal Medicine

## 2014-10-07 VITALS — BP 150/78 | HR 68 | Ht 71.0 in | Wt 157.0 lb

## 2014-10-07 DIAGNOSIS — M6281 Muscle weakness (generalized): Secondary | ICD-10-CM | POA: Diagnosis not present

## 2014-10-07 DIAGNOSIS — Z01818 Encounter for other preprocedural examination: Secondary | ICD-10-CM

## 2014-10-07 DIAGNOSIS — G309 Alzheimer's disease, unspecified: Secondary | ICD-10-CM | POA: Diagnosis not present

## 2014-10-07 DIAGNOSIS — I481 Persistent atrial fibrillation: Secondary | ICD-10-CM | POA: Diagnosis not present

## 2014-10-07 DIAGNOSIS — F0280 Dementia in other diseases classified elsewhere without behavioral disturbance: Secondary | ICD-10-CM

## 2014-10-07 DIAGNOSIS — I5022 Chronic systolic (congestive) heart failure: Secondary | ICD-10-CM

## 2014-10-07 DIAGNOSIS — Z950 Presence of cardiac pacemaker: Secondary | ICD-10-CM | POA: Diagnosis not present

## 2014-10-07 DIAGNOSIS — D61818 Other pancytopenia: Secondary | ICD-10-CM | POA: Diagnosis not present

## 2014-10-07 DIAGNOSIS — K929 Disease of digestive system, unspecified: Secondary | ICD-10-CM | POA: Diagnosis not present

## 2014-10-07 DIAGNOSIS — I4819 Other persistent atrial fibrillation: Secondary | ICD-10-CM

## 2014-10-07 MED ORDER — FENTANYL 12 MCG/HR TD PT72: 12.5000 ug | MEDICATED_PATCH | TRANSDERMAL | Status: DC

## 2014-10-07 NOTE — Patient Instructions (Signed)
Your physician has recommended that you have a changeout.  Your physician recommends that you return for lab work in: CBC/BMET  Your physician recommends that you continue on your current medications as directed. Please refer to the Current Medication list given to you today.  Thank you for choosing [facility]!

## 2014-10-07 NOTE — Assessment & Plan Note (Signed)
His symptoms are class 2B. He will continue his current meds. He has refused lasix. I encouraged a low sodium diet.

## 2014-10-07 NOTE — Assessment & Plan Note (Signed)
He has reached ERI on his current device. Will schedule for PPM generator change.

## 2014-10-07 NOTE — Assessment & Plan Note (Signed)
Today, he has minimal symptoms and is not agitated. Will follow.

## 2014-10-07 NOTE — Assessment & Plan Note (Signed)
He is using amio for rate control. I would consider switching to a beta blocker in the future.

## 2014-10-07 NOTE — Progress Notes (Signed)
HPI [name] returns today for followup. He is a pleasant elderly man with chronic systolic CHF, non-ischemic CM, HTN and arthritis. He is s/p BiV PPM insertion. When I saw him last, his heart failure had worsened and he had developed worsening atrial fib. He was placed on amio, xarelto and lasix. He developed a GI bleed on his blood thinner and could not tolerate the lasix. He stopped both. He has now reached ERI. No other complaints.  Allergies  Allergen Reactions  . Codeine Diarrhea  . Penicillins     Other reaction(s): OTHER     Current Outpatient Prescriptions  Medication Sig Dispense Refill  . amLODipine (NORVASC) 2.5 MG tablet TAKE 1 TABLET (2.5 MG TOTAL) BY MOUTH DAILY. 90 tablet 1  . dorzolamide-timolol (COSOPT) 22.3-6.8 MG/ML ophthalmic solution Place 1 drop into both eyes 2 (two) times daily.     . fentaNYL (DURAGESIC - DOSED MCG/HR) 12 MCG/HR Place 12.5 mcg onto the skin every 3 (three) days.    . hydrochlorothiazide (HYDRODIURIL) 25 MG tablet Take 1 tablet (25 mg total) by mouth daily. 90 tablet 3  . latanoprost (XALATAN) 0.005 % ophthalmic solution Place 1 drop into both eyes at bedtime.     . Meth-Hyo-M Bl-Na Phos-Ph Sal (URIBEL) 118 MG CAPS Take 118 mg by mouth 3 (three) times daily as needed.    . silodosin (RAPAFLO) 8 MG CAPS capsule Take 8 mg by mouth daily with breakfast.    . traMADol (ULTRAM) 50 MG tablet Once daily as needed, for  uncontrolled pain 30 tablet 0   No current facility-administered medications for this visit.     Past Medical History  Diagnosis Date  . Chronic systolic heart failure   . Rash and other nonspecific skin eruption   . Costochondritis, acute   . Glaucoma   . Erectile dysfunction   . Visual changes   . Pancytopenia   . Chronic pancreatitis     Based on CT findings  . History of melanoma in situ     JAN 2012--  LEFT HEEL W/ SLN BX  . Chronic back pain   . IC (interstitial cystitis)   . BPH (benign prostatic hypertrophy)   .  Cardiac pacemaker     CARDIOLOGIST-- DR Cristopher Peru (LAST PACE [facility] 05-15-2013)  . Asymptomatic carotid artery stenosis     BILATERAL MILD ICA---  <50% PER DUPLEX 03-08-2008  . History of syncope   . Hypertension   . CHB (complete heart block)   . Nonischemic cardiomyopathy   . Severe mitral regurgitation   . Urge urinary incontinence   . A-fib   . Dementia     ROS:   All systems reviewed and negative except as noted in the HPI.   Past Surgical History  Procedure Laterality Date  . Cataract extraction, bilateral  left  1997/   right 2003  . Pacemaker insertion  12-06-2008  DR Azrael Huss    BiV PPM  --  [employer]  . Colonoscopy  08/18/2006    MWN:UUVO granularity and erosions of the rectum of uncertain significance biopsied.  A long redundant, but otherwise normal-appearing colon.melanosi coli and minimal inflammation on bx  . Inguinal hernia repair Bilateral AS TEEN  . Interstim implant placement  2001  &  2007  . Cysto/ hod/  replacement interstim implant  05-14-2003  . Removal interstim implant/  cyst/  hod  04-14-2004  . Revision interstim implant and replace generator  05-15-2009    left upper buttock  for urge urinary incontinence  . Wide excision left heel and sln bx  JUNE 2012  . Tonsillectomy  1950  . Left elbow surgery  2002  . Back surgery  X3  LAST ONE'90's  . Transthoracic echocardiogram  12-06-2008    LVSF 55-60%/  MODERATE MR/  MILD AR/  QUESTION DISTAL POSTERIOR HYPOKINESIS  . Interstim implant revision N/A 08/28/2013    Procedure: REPLACEMENT OF INTERSTIM-GENERATOR AND LEAD ;  Surgeon: [name], MD;  Location: [place];  Service: Urology;  Laterality: N/A;  . Esophagogastroduodenoscopy N/A 03/28/2014    Dr. [name]: normal esophagus s/p dilation, mosaic appearance - chronic inflammation noted on bx  . Savory dilation N/A 03/28/2014    Procedure: SAVORY DILATION;  Surgeon: [name], MD;  [facility];  Service:  Endoscopy;  Laterality: N/A;  Venia Minks dilation N/A 03/28/2014    Procedure: Venia Minks DILATION;  Surgeon: [name], MD;  [facility];  Service: Endoscopy;  Laterality: N/A;  . Colonoscopy N/A 08/05/2014    Procedure: COLONOSCOPY;  Surgeon: [name], MD;  [facility];  Service: Endoscopy;  Laterality: N/A;  PT NEEDS TCS AT 1000.     Family History  Problem Relation Age of Onset  . Dementia Mother   . Throat cancer Father   . Cancer Father     throat  . Lung cancer Sister      History   Social History  . Marital Status: Married    Spouse Name: N/A    Number of Children: N/A  . Years of Education: N/A   Occupational History  . retired     Social History Main Topics  . Smoking status: Former Smoker    Quit date: 08/24/1997  . Smokeless tobacco: Not on file  . Alcohol Use: No  . Drug Use: No  . Sexual Activity: Not on file   Other Topics Concern  . Not on file   Social History Narrative     BP 150/78 mmHg  Pulse 68  Ht 5\' 11"  (1.803 m)  Wt 157 lb (71.215 kg)  BMI 21.91 kg/m2  Physical Exam:  Well appearing elderly man, NAD HEENT: Unremarkable Neck:  7 cm JVD, no thyromegally Back:  No CVA tenderness Lungs:  Clear with no wheezes HEART:  Regular rate rhythm, grade 3/6 systolic murmur, no rubs, no clicks Abd:  soft, positive bowel sounds, no organomegally, no rebound, no guarding Ext:  2 plus pulses, no edema, no cyanosis, no clubbing Skin:  No rashes no nodules Neuro:  CN II through XII intact, motor grossly intact   DEVICE  Normal device function.  See PaceArt for details. Device at [facility]  Assess/Plan:

## 2014-10-08 LAB — CBC WITH DIFFERENTIAL/PLATELET
BASOS PCT: 1 % (ref 0–1)
Basophils Absolute: 0 10*3/uL (ref 0.0–0.1)
Eosinophils Absolute: 0 10*3/uL (ref 0.0–0.7)
Eosinophils Relative: 2 % (ref 0–5)
HCT: 30.7 % — ABNORMAL LOW (ref 39.0–52.0)
HEMOGLOBIN: 10.5 g/dL — AB (ref 13.0–17.0)
LYMPHS ABS: 0.5 10*3/uL — AB (ref 0.7–4.0)
LYMPHS PCT: 23 % (ref 12–46)
MCH: 32.3 pg (ref 26.0–34.0)
MCHC: 34.2 g/dL (ref 30.0–36.0)
MCV: 94.5 fL (ref 78.0–100.0)
MONOS PCT: 13 % — AB (ref 3–12)
MPV: 10.1 fL (ref 8.6–12.4)
Monocytes Absolute: 0.3 10*3/uL (ref 0.1–1.0)
NEUTROS PCT: 61 % (ref 43–77)
Neutro Abs: 1.4 10*3/uL — ABNORMAL LOW (ref 1.7–7.7)
Platelets: 119 10*3/uL — ABNORMAL LOW (ref 150–400)
RBC: 3.25 MIL/uL — AB (ref 4.22–5.81)
RDW: 14.2 % (ref 11.5–15.5)
WBC: 2.3 10*3/uL — ABNORMAL LOW (ref 4.0–10.5)

## 2014-10-08 LAB — BASIC METABOLIC PANEL
BUN: 18 mg/dL (ref 6–23)
CHLORIDE: 107 meq/L (ref 96–112)
CO2: 28 meq/L (ref 19–32)
CREATININE: 0.98 mg/dL (ref 0.50–1.35)
Calcium: 8.5 mg/dL (ref 8.4–10.5)
Glucose, Bld: 83 mg/dL (ref 70–99)
Potassium: 3.8 mEq/L (ref 3.5–5.3)
Sodium: 140 mEq/L (ref 135–145)

## 2014-10-11 ENCOUNTER — Encounter (HOSPITAL_COMMUNITY): Payer: Self-pay | Admitting: Internal Medicine

## 2014-10-11 ENCOUNTER — Ambulatory Visit (HOSPITAL_COMMUNITY)
Admission: RE | Admit: 2014-10-11 | Discharge: 2014-10-11 | Disposition: A | Discharge status: Home / Self Care | Payer: Medicare Other | Source: Ambulatory Visit | Attending: Internal Medicine | Admitting: Internal Medicine

## 2014-10-11 ENCOUNTER — Encounter (HOSPITAL_COMMUNITY)
Admission: RE | Disposition: A | Discharge status: Home / Self Care | Payer: Self-pay | Source: Ambulatory Visit | Attending: Internal Medicine

## 2014-10-11 DIAGNOSIS — R922 Inconclusive mammogram: Secondary | ICD-10-CM | POA: Insufficient documentation

## 2014-10-11 DIAGNOSIS — I442 Atrioventricular block, complete: Secondary | ICD-10-CM | POA: Diagnosis not present

## 2014-10-11 DIAGNOSIS — I4891 Unspecified atrial fibrillation: Secondary | ICD-10-CM | POA: Insufficient documentation

## 2014-10-11 DIAGNOSIS — I10 Essential (primary) hypertension: Secondary | ICD-10-CM | POA: Diagnosis not present

## 2014-10-11 DIAGNOSIS — F0390 Unspecified dementia without behavioral disturbance: Secondary | ICD-10-CM | POA: Insufficient documentation

## 2014-10-11 DIAGNOSIS — M1990 Unspecified osteoarthritis, unspecified site: Secondary | ICD-10-CM | POA: Diagnosis not present

## 2014-10-11 DIAGNOSIS — Z79899 Other long term (current) drug therapy: Secondary | ICD-10-CM | POA: Diagnosis not present

## 2014-10-11 DIAGNOSIS — G8929 Other chronic pain: Secondary | ICD-10-CM | POA: Diagnosis not present

## 2014-10-11 DIAGNOSIS — I5022 Chronic systolic (congestive) heart failure: Secondary | ICD-10-CM | POA: Insufficient documentation

## 2014-10-11 DIAGNOSIS — N400 Enlarged prostate without lower urinary tract symptoms: Secondary | ICD-10-CM | POA: Diagnosis not present

## 2014-10-11 DIAGNOSIS — M549 Dorsalgia, unspecified: Secondary | ICD-10-CM | POA: Diagnosis not present

## 2014-10-11 DIAGNOSIS — Z87891 Personal history of nicotine dependence: Secondary | ICD-10-CM | POA: Insufficient documentation

## 2014-10-11 DIAGNOSIS — H409 Unspecified glaucoma: Secondary | ICD-10-CM | POA: Diagnosis not present

## 2014-10-11 DIAGNOSIS — Z45010 Encounter for checking and testing of cardiac pacemaker pulse generator [battery]: Secondary | ICD-10-CM | POA: Diagnosis not present

## 2014-10-11 HISTORY — PX: IMPLANTABLE CARDIOVERTER DEFIBRILLATOR (ICD) GENERATOR CHANGE: SHX5469

## 2014-10-11 LAB — SURGICAL PCR SCREEN
MRSA, PCR: NEGATIVE
Staphylococcus aureus: POSITIVE — AB

## 2014-10-11 SURGERY — ICD GENERATOR CHANGE

## 2014-10-11 MED ORDER — VANCOMYCIN HCL IN DEXTROSE 1-5 GM/200ML-% IV SOLN
1000.0000 mg | INTRAVENOUS | Status: DC
  Filled 2014-10-11: qty 200

## 2014-10-11 MED ORDER — MIDAZOLAM HCL 5 MG/5ML IJ SOLN
INTRAMUSCULAR | Status: AC
  Filled 2014-10-11: qty 5

## 2014-10-11 MED ORDER — FENTANYL CITRATE 0.05 MG/ML IJ SOLN
INTRAMUSCULAR | Status: AC
  Filled 2014-10-11: qty 2

## 2014-10-11 MED ORDER — ONDANSETRON HCL 4 MG/2ML IJ SOLN: 4.0000 mg | Freq: Four times a day (QID) | INTRAMUSCULAR | Status: DC | PRN

## 2014-10-11 MED ORDER — ACETAMINOPHEN 325 MG PO TABS: 325.0000 mg | ORAL_TABLET | ORAL | Status: DC | PRN

## 2014-10-11 MED ORDER — SODIUM CHLORIDE 0.9 % IV SOLN
INTRAVENOUS | Status: DC
  Administered 2014-10-11: 13:00:00 via INTRAVENOUS

## 2014-10-11 MED ORDER — MUPIROCIN 2 % EX OINT
1.0000 "application " | TOPICAL_OINTMENT | Freq: Once | CUTANEOUS | Status: AC
  Administered 2014-10-11: 1 via TOPICAL

## 2014-10-11 MED ORDER — MUPIROCIN 2 % EX OINT
TOPICAL_OINTMENT | CUTANEOUS | Status: AC
  Filled 2014-10-11: qty 22

## 2014-10-11 MED ORDER — GENTAMICIN SULFATE 40 MG/ML IJ SOLN
80.0000 mg | INTRAMUSCULAR | Status: DC
  Filled 2014-10-11: qty 2

## 2014-10-11 MED ORDER — LIDOCAINE HCL (PF) 1 % IJ SOLN
INTRAMUSCULAR | Status: AC
Start: 2014-10-11 — End: 2014-10-11
  Filled 2014-10-11: qty 60

## 2014-10-11 MED ORDER — CHLORHEXIDINE GLUCONATE 4 % EX LIQD
60.0000 mL | Freq: Once | CUTANEOUS | Status: DC
  Filled 2014-10-11: qty 60

## 2014-10-11 NOTE — Interval H&P Note (Signed)
History and Physical Interval Note:  10/11/2014 1:28 PM  [name]  has presented today for surgery, with the diagnosis of recall  The various methods of treatment have been discussed with the patient and family. After consideration of risks, benefits and other options for treatment, the patient has consented to  BiV PM generator change out as a surgical intervention .  The patient's history has been reviewed, patient examined, no change in status, stable for surgery.  I have reviewed the patient's chart and labs.  Questions were answered to the patient's satisfaction.     Mikle Bosworth.D.

## 2014-10-11 NOTE — H&P (View-Only) (Signed)
HPI [name] returns today for followup. He is a pleasant elderly man with chronic systolic CHF, non-ischemic CM, HTN and arthritis. He is s/p BiV PPM insertion. When I saw him last, his heart failure had worsened and he had developed worsening atrial fib. He was placed on amio, xarelto and lasix. He developed a GI bleed on his blood thinner and could not tolerate the lasix. He stopped both. He has now reached ERI. No other complaints.  Allergies  Allergen Reactions  . Codeine Diarrhea  . Penicillins     Other reaction(s): OTHER     Current Outpatient Prescriptions  Medication Sig Dispense Refill  . amLODipine (NORVASC) 2.5 MG tablet TAKE 1 TABLET (2.5 MG TOTAL) BY MOUTH DAILY. 90 tablet 1  . dorzolamide-timolol (COSOPT) 22.3-6.8 MG/ML ophthalmic solution Place 1 drop into both eyes 2 (two) times daily.     . fentaNYL (DURAGESIC - DOSED MCG/HR) 12 MCG/HR Place 12.5 mcg onto the skin every 3 (three) days.    . hydrochlorothiazide (HYDRODIURIL) 25 MG tablet Take 1 tablet (25 mg total) by mouth daily. 90 tablet 3  . latanoprost (XALATAN) 0.005 % ophthalmic solution Place 1 drop into both eyes at bedtime.     . Meth-Hyo-M Bl-Na Phos-Ph Sal (URIBEL) 118 MG CAPS Take 118 mg by mouth 3 (three) times daily as needed.    . silodosin (RAPAFLO) 8 MG CAPS capsule Take 8 mg by mouth daily with breakfast.    . traMADol (ULTRAM) 50 MG tablet Once daily as needed, for  uncontrolled pain 30 tablet 0   No current facility-administered medications for this visit.     Past Medical History  Diagnosis Date  . Chronic systolic heart failure   . Rash and other nonspecific skin eruption   . Costochondritis, acute   . Glaucoma   . Erectile dysfunction   . Visual changes   . Pancytopenia   . Chronic pancreatitis     Based on CT findings  . History of melanoma in situ     JAN 2012--  LEFT HEEL W/ SLN BX  . Chronic back pain   . IC (interstitial cystitis)   . BPH (benign prostatic hypertrophy)   .  Cardiac pacemaker     CARDIOLOGIST-- DR Cristopher Peru (LAST PACE [facility] 05-15-2013)  . Asymptomatic carotid artery stenosis     BILATERAL MILD ICA---  <50% PER DUPLEX 03-08-2008  . History of syncope   . Hypertension   . CHB (complete heart block)   . Nonischemic cardiomyopathy   . Severe mitral regurgitation   . Urge urinary incontinence   . A-fib   . Dementia     ROS:   All systems reviewed and negative except as noted in the HPI.   Past Surgical History  Procedure Laterality Date  . Cataract extraction, bilateral  left  1997/   right 2003  . Pacemaker insertion  12-06-2008  DR Ellyana Crigler    BiV PPM  --  [employer]  . Colonoscopy  08/18/2006    MOQ:HUTM granularity and erosions of the rectum of uncertain significance biopsied.  A long redundant, but otherwise normal-appearing colon.melanosi coli and minimal inflammation on bx  . Inguinal hernia repair Bilateral AS TEEN  . Interstim implant placement  2001  &  2007  . Cysto/ hod/  replacement interstim implant  05-14-2003  . Removal interstim implant/  cyst/  hod  04-14-2004  . Revision interstim implant and replace generator  05-15-2009    left upper buttock  for urge urinary incontinence  . Wide excision left heel and sln bx  JUNE 2012  . Tonsillectomy  1950  . Left elbow surgery  2002  . Back surgery  X3  LAST ONE'90's  . Transthoracic echocardiogram  12-06-2008    LVSF 55-60%/  MODERATE MR/  MILD AR/  QUESTION DISTAL POSTERIOR HYPOKINESIS  . Interstim implant revision N/A 08/28/2013    Procedure: REPLACEMENT OF INTERSTIM-GENERATOR AND LEAD ;  Surgeon: [name], MD;  Location: [place];  Service: Urology;  Laterality: N/A;  . Esophagogastroduodenoscopy N/A 03/28/2014    Dr. [name]: normal esophagus s/p dilation, mosaic appearance - chronic inflammation noted on bx  . Savory dilation N/A 03/28/2014    Procedure: SAVORY DILATION;  Surgeon: [name], MD;  [facility];  Service:  Endoscopy;  Laterality: N/A;  Venia Minks dilation N/A 03/28/2014    Procedure: Venia Minks DILATION;  Surgeon: [name], MD;  [facility];  Service: Endoscopy;  Laterality: N/A;  . Colonoscopy N/A 08/05/2014    Procedure: COLONOSCOPY;  Surgeon: [name], MD;  [facility];  Service: Endoscopy;  Laterality: N/A;  PT NEEDS TCS AT 1000.     Family History  Problem Relation Age of Onset  . Dementia Mother   . Throat cancer Father   . Cancer Father     throat  . Lung cancer Sister      History   Social History  . Marital Status: Married    Spouse Name: N/A    Number of Children: N/A  . Years of Education: N/A   Occupational History  . retired     Social History Main Topics  . Smoking status: Former Smoker    Quit date: 08/24/1997  . Smokeless tobacco: Not on file  . Alcohol Use: No  . Drug Use: No  . Sexual Activity: Not on file   Other Topics Concern  . Not on file   Social History Narrative     BP 150/78 mmHg  Pulse 68  Ht 5\' 11"  (1.803 m)  Wt 157 lb (71.215 kg)  BMI 21.91 kg/m2  Physical Exam:  Well appearing elderly man, NAD HEENT: Unremarkable Neck:  7 cm JVD, no thyromegally Back:  No CVA tenderness Lungs:  Clear with no wheezes HEART:  Regular rate rhythm, grade 3/6 systolic murmur, no rubs, no clicks Abd:  soft, positive bowel sounds, no organomegally, no rebound, no guarding Ext:  2 plus pulses, no edema, no cyanosis, no clubbing Skin:  No rashes no nodules Neuro:  CN II through XII intact, motor grossly intact   DEVICE  Normal device function.  See PaceArt for details. Device at [facility]  Assess/Plan:

## 2014-10-11 NOTE — Discharge Instructions (Signed)
Pacemaker Battery Change, Care After °Refer to this sheet in the next few weeks. These instructions provide you with information on caring for yourself after your procedure. Your health care provider may also give you more specific instructions. Your treatment has been planned according to current medical practices, but problems sometimes occur. Call your health care provider if you have any problems or questions after your procedure. °WHAT TO EXPECT AFTER THE PROCEDURE °After your procedure, it is typical to have the following sensations: °· Soreness at the pacemaker site. °HOME CARE INSTRUCTIONS  °· Keep the incision clean and dry. °· Unless advised otherwise, you may shower beginning 48 hours after your procedure. °· For the first week after the replacement, avoid stretching motions that pull at the incision site, and avoid heavy exercise with the arm that is on the same side as the incision. °· Take medicines only as directed by your health care provider. °· Keep all follow-up visits as directed by your health care provider. °SEEK MEDICAL CARE IF:  °· You have pain at the incision site that is not relieved by over-the-counter or prescription medicine. °· There is drainage or pus from the incision site. °· There is swelling larger than a lime at the incision site. °· You develop red streaking that extends above or below the incision site. °· You feel brief, intermittent palpitations, light-headedness, or any symptoms that you feel might be related to your heart. °SEEK IMMEDIATE MEDICAL CARE IF:  °· You experience chest pain that is different than the pain at the pacemaker site. °· You experience shortness of breath. °· You have palpitations or irregular heartbeat. °· You have light-headedness that does not go away quickly. °· You faint. °· You have pain that gets worse and is not relieved by medicine. °Document Released: 07/11/2013 Document Revised: 02/04/2014 Document Reviewed: 07/11/2013 °ExitCare® Patient  Information ©2015 [employer]. This information is not intended to replace advice given to you by your health care provider. Make sure you discuss any questions you have with your health care provider. ° °

## 2014-10-11 NOTE — CV Procedure (Signed)
Electrophysiology Procedure Note  Preoperative diagnosis: Complete heart block, chronic systolic heart failure, s/p biV PM with the old device at ERI  Postoperative diagnosis: same as preoperative diagnosis  Description of the Procedure: After informed consent was obtained, the patient was taken to the diagnostic electrophysiology laboratory in the fasting state. After the usual preparation and draping, 30 cc of lidocaine was infiltrated into the subcutaneous and deeper tissues. A 6 cm incision was carried out. Electrocautery was utilized to dissect down to the pacemaker pocket. The generator was removed. The old Medtronic biventricular pacemaker was disconnected from the atrial, RV, and LV leads, and a new Medtronic biventricular pacemaker, serial [id number] was connected to the old leads. At this point blunt dissection and electrocautery were utilized to revise the subcutaneous pocket. The leads were freed up with electrocautery. Electrocautery was utilized to assure hemostasis. The new Medtronic biventricular pacemaker and old pacing leads were placed into the subcutaneous pocket. The pocket was irrigated with antibiotic irrigation. The incision was closed with 2 layers of Vicryl suture. Pressure was held. Benzoin and Steri-Strips were painted over the skin. A pressure dressing was placed. The patient was returned to his room in satisfactory condition.  Complications: There were no immediate procedure complications  Conclusion: Successful removal of a previously implanted Medtronic biventricular pacemaker which had reached elective replacement, followed by insertion of a new Medtronic biventricular pacemaker with revision of the patient's pocket to accomodate the different shape of the leads and to free up the dense fibrous scar tissue.  Cristopher Peru, M.D

## 2014-10-14 ENCOUNTER — Telehealth: Payer: Self-pay

## 2014-10-14 ENCOUNTER — Telehealth: Payer: Self-pay | Admitting: Internal Medicine

## 2014-10-14 ENCOUNTER — Telehealth: Payer: Self-pay | Admitting: *Deleted

## 2014-10-14 NOTE — Telephone Encounter (Signed)
New Message  Pt requested to speak with Rn about what to do going forward following device placement on 1/8. Please call back to discuss.

## 2014-10-14 NOTE — Telephone Encounter (Signed)
Received call from Kerin Ransom front desk, who found a message sent to her Friday 10/11/14 at 5:30 pm stating patient could come to [place] office to have post pacer insertion bandage removed-to leave on steri strips. i called patient and told him to come direct to this office.I will cx the [place] apt and await his arrival here

## 2014-10-14 NOTE — Telephone Encounter (Signed)
Pt states that Dr Lovena Le told him to come to office today and have bandage removed from having pacemaker put in last week.

## 2014-10-14 NOTE — Telephone Encounter (Signed)
Per Paula,pt must make apt at [facility] in [place] for wound check, spoke ith wife and gave her the number to [facility] to arrange

## 2014-10-14 NOTE — Telephone Encounter (Signed)
Patient was wanting an appointment today for wound check for pacemaker which was put in on 10/11/14. Advised patient that wound checks on pacemaker is normally 10 days. Patient also wanted this appointment to be in [place] office. Patient has an appointment right now for 10/21/14 at the [facility] office in [place]. Patient wants appointment changed. Called [place] office to see about scheduling patient. [place] office aware of patient wanting appointment, due to four calls placed by patient today. [place] will follow-up with patient.

## 2014-10-14 NOTE — Telephone Encounter (Signed)
1511 hrs: Patient arrived to [place] office.Guaze bandage removed from pacemaker insertion site on upper left anterior chest wall. Bandage dry, without drainage at site,no swelling or redness,steri strips intact    LM for melissa in [place] office to rechedule pt for 10 day steri strip removal and pacer check.I asked her to call wife and give details.They are in route home and unable to be reached. The wife does not realize that patient doe need to go to the [facility] office.

## 2014-10-17 ENCOUNTER — Encounter (HOSPITAL_COMMUNITY): Payer: Self-pay | Admitting: Internal Medicine

## 2014-10-17 ENCOUNTER — Ambulatory Visit (INDEPENDENT_AMBULATORY_CARE_PROVIDER_SITE_OTHER): Payer: Medicare Other | Admitting: Family Medicine

## 2014-10-17 VITALS — BP 140/82 | HR 62 | Resp 16 | Ht 71.0 in | Wt 156.8 lb

## 2014-10-17 DIAGNOSIS — Z0000 Encounter for general adult medical examination without abnormal findings: Secondary | ICD-10-CM

## 2014-10-17 NOTE — Patient Instructions (Addendum)
F/u in 4 month, call if you need me  Before  No changes in medication   Keep appt with Dr Doristine Devoid for your eyes, left eye does not see well  Start wearing hearing aids EVERY day  Careful not to fall  Ride bike every day to strengthen leg muscles  Fall Prevention and Home Safety Falls cause injuries and can affect all age groups. It is possible to prevent falls.  HOW TO PREVENT FALLS  Wear shoes with rubber soles that do not have an opening for your toes.  Keep the inside and outside of your house well lit.  Use night lights throughout your home.  Remove clutter from floors.  Clean up floor spills.  Remove throw rugs or fasten them to the floor with carpet tape.  Do not place electrical cords across pathways.  Put grab bars by your tub, shower, and toilet. Do not use towel bars as grab bars.  Put handrails on both sides of the stairway. Fix loose handrails.  Do not climb on stools or stepladders, if possible.  Do not wax your floors.  Repair uneven or unsafe sidewalks, walkways, or stairs.  Keep items you use a lot within reach.  Be aware of pets.  Keep emergency numbers next to the telephone.  Put smoke detectors in your home and near bedrooms. Ask your doctor what other things you can do to prevent falls. Document Released: 07/17/2009 Document Revised: 03/21/2012 Document Reviewed: 12/21/2011 [facility] Patient Information 2015 [place], [place]. This information is not intended to replace advice given to you by your health care provider. Make sure you discuss any questions you have with your health care provider.

## 2014-10-17 NOTE — Progress Notes (Signed)
Subjective:    Patient ID: [name], male    DOB: [birth year], 81 y.o.   MRN: [id number]  HPI Preventive Screening-Counseling & Management   Patient present here today for a Medicare annual wellness visit.   Current Problems (verified)   Medications Prior to Visit Allergies (verified)   PAST HISTORY  Family History (verified)   Social History Married for 18 years, 2 boys and 2 girls, he retired from [employer]  Current exercise habits:  Uses an exercise bike sometimes to help strengthen his legs   Dietary issues discussed: Heart healthy diet discussed, need to reduce fried foods and red meat and eat more chicken fish and [place]. Loves to eat salads                                             Encouraged more fruits and vegetables and to cut back on sweets that he likes to eat a lot   Cardiac risk factors: Brother has "heart problems" per patient but unsure of what age of dx   Depression Screen  (Note: if answer to either of the following is "Yes", a more complete depression screening is indicated)   Over the past two weeks, have you felt down, depressed or hopeless? No  Over the past two weeks, have you felt little interest or pleasure in doing things? No  Have you lost interest or pleasure in daily life? No  Do you often feel hopeless? No  Do you cry easily over simple problems? No   Activities of Daily Living  In your present state of health, do you have any difficulty performing the following activities?  Driving?: states he still does drive around the house but his wife usually drives.  Managing money?: No Feeding yourself?:No Getting from bed to chair?: no- sometimes uses a cane for stability  Climbing a flight of stairs?: hard for him to do that Preparing food and eating?:No Bathing or showering?:No Getting dressed?:No Getting to the toilet?:No Using the toilet?:No Moving around from place to place?: no, sometimes needs cane to walk due to leg  weakness   Fall Risk Assessment In the past year have you fallen or had a near fall?:yes, 3 times per wife. Lost his balance due to leg weakness   Are you currently taking any medications that make you dizziness?:No   Hearing Difficulties: No Do you often ask people to speak up or repeat themselves?:yes, has hearing aids but chooses not to wear them a lot of the time  Do you experience ringing or noises in your ears?:No Do you have difficulty understanding soft or whispered voices?:yes  Cognitive Testing  Alert? Yes Normal Appearance?Yes  Oriented to person? Yes Place? Yes  Time? Yes  Displays appropriate judgment?Yes  Can read the correct time from a watch face? yes Are you having problems remembering things? No   Advanced Directives have been discussed with the patient?Yes, brochure given , full code   List the Names of Other Physician/Practitioners you currently use:  Dr Jeffie Pollock (Urology ) Dr Lovena Le (cardiology)  Indicate any recent Medical Services you may have received from other than [facility] providers in the past year (date may be approximate).   Assessment:    Annual Wellness Exam   Plan:     Medicare Attestation  I have personally reviewed:  The patient's medical and  social history  Their use of alcohol, tobacco or illicit drugs  Their current medications and supplements  The patient's functional ability including ADLs,fall risks, home safety risks, cognitive, and hearing and visual impairment  Diet and physical activities  Evidence for depression or mood disorders  The patient's weight, height, BMI, and visual acuity have been recorded in the chart. I have made referrals, counseling, and provided education to the patient based on review of the above and I have provided the patient with a written personalized care plan for preventive services.      Review of Systems     Objective:   Physical Exam  BP 140/82 mmHg  Pulse 62  Resp 16  Ht 5\' 11"  (1.803 m)  Wt  156 lb 12.8 oz (71.124 kg)  BMI 21.88 kg/m2  SpO2 96%       Assessment & Plan:  Medicare annual wellness visit, subsequent Annual exam as documented. Counseling done  re healthy lifestyle involving commitment to 150 minutes exercise per week, heart healthy diet, and attaining healthy weight.The importance of adequate sleep also discussed. Regular seat belt use and home safety, is also discussed. Changes in health habits are decided on by the patient with goals and time frames  set for achieving them. Immunization and cancer screening needs are specifically addressed at this visit.

## 2014-10-17 NOTE — Assessment & Plan Note (Signed)

## 2014-10-21 ENCOUNTER — Ambulatory Visit: Payer: Self-pay

## 2014-11-04 ENCOUNTER — Other Ambulatory Visit: Payer: Self-pay | Admitting: Family Medicine

## 2014-11-09 ENCOUNTER — Emergency Department (HOSPITAL_COMMUNITY)
Admission: EM | Admit: 2014-11-09 | Discharge: 2014-11-09 | Disposition: A | Discharge status: Home / Self Care | Payer: Medicare Other | Attending: Emergency Medicine | Admitting: Emergency Medicine

## 2014-11-09 ENCOUNTER — Emergency Department (HOSPITAL_COMMUNITY): Payer: Medicare Other

## 2014-11-09 ENCOUNTER — Encounter (HOSPITAL_COMMUNITY): Payer: Self-pay | Admitting: Emergency Medicine

## 2014-11-09 DIAGNOSIS — H409 Unspecified glaucoma: Secondary | ICD-10-CM | POA: Diagnosis not present

## 2014-11-09 DIAGNOSIS — Y9389 Activity, other specified: Secondary | ICD-10-CM | POA: Diagnosis not present

## 2014-11-09 DIAGNOSIS — Z950 Presence of cardiac pacemaker: Secondary | ICD-10-CM | POA: Diagnosis not present

## 2014-11-09 DIAGNOSIS — Z880 Allergy status to penicillin: Secondary | ICD-10-CM | POA: Diagnosis not present

## 2014-11-09 DIAGNOSIS — Z79899 Other long term (current) drug therapy: Secondary | ICD-10-CM | POA: Diagnosis not present

## 2014-11-09 DIAGNOSIS — Z87891 Personal history of nicotine dependence: Secondary | ICD-10-CM | POA: Insufficient documentation

## 2014-11-09 DIAGNOSIS — S0512XA Contusion of eyeball and orbital tissues, left eye, initial encounter: Secondary | ICD-10-CM | POA: Diagnosis not present

## 2014-11-09 DIAGNOSIS — S0012XA Contusion of left eyelid and periocular area, initial encounter: Secondary | ICD-10-CM

## 2014-11-09 DIAGNOSIS — W01198A Fall on same level from slipping, tripping and stumbling with subsequent striking against other object, initial encounter: Secondary | ICD-10-CM | POA: Insufficient documentation

## 2014-11-09 DIAGNOSIS — Y9289 Other specified places as the place of occurrence of the external cause: Secondary | ICD-10-CM | POA: Insufficient documentation

## 2014-11-09 DIAGNOSIS — I10 Essential (primary) hypertension: Secondary | ICD-10-CM | POA: Insufficient documentation

## 2014-11-09 DIAGNOSIS — Z87448 Personal history of other diseases of urinary system: Secondary | ICD-10-CM | POA: Insufficient documentation

## 2014-11-09 DIAGNOSIS — G8929 Other chronic pain: Secondary | ICD-10-CM | POA: Insufficient documentation

## 2014-11-09 DIAGNOSIS — Z8739 Personal history of other diseases of the musculoskeletal system and connective tissue: Secondary | ICD-10-CM | POA: Diagnosis not present

## 2014-11-09 DIAGNOSIS — W19XXXA Unspecified fall, initial encounter: Secondary | ICD-10-CM

## 2014-11-09 DIAGNOSIS — I5022 Chronic systolic (congestive) heart failure: Secondary | ICD-10-CM | POA: Diagnosis not present

## 2014-11-09 DIAGNOSIS — Y998 Other external cause status: Secondary | ICD-10-CM | POA: Diagnosis not present

## 2014-11-09 DIAGNOSIS — F0390 Unspecified dementia without behavioral disturbance: Secondary | ICD-10-CM | POA: Insufficient documentation

## 2014-11-09 DIAGNOSIS — S0990XA Unspecified injury of head, initial encounter: Secondary | ICD-10-CM | POA: Diagnosis not present

## 2014-11-09 DIAGNOSIS — S0993XA Unspecified injury of face, initial encounter: Secondary | ICD-10-CM | POA: Diagnosis not present

## 2014-11-09 DIAGNOSIS — S0592XA Unspecified injury of left eye and orbit, initial encounter: Secondary | ICD-10-CM | POA: Diagnosis present

## 2014-11-09 DIAGNOSIS — Z85820 Personal history of malignant melanoma of skin: Secondary | ICD-10-CM | POA: Diagnosis not present

## 2014-11-09 DIAGNOSIS — S0083XA Contusion of other part of head, initial encounter: Secondary | ICD-10-CM | POA: Diagnosis not present

## 2014-11-09 NOTE — ED Provider Notes (Addendum)
CSN: [id number]     Arrival date & time 11/09/14  1129 History  This chart was scribed for Tanna Furry, MD by Evelene Croon, ED Scribe. This patient was seen in room APA15/APA15 and the patient's care was started 12:54 PM.    Chief Complaint  Patient presents with  . Fall    The history is provided by the patient. No language interpreter was used.     HPI Comments:  [name] is a 81 y.o. male who presents to the Emergency Department s/p fall about 4 days ago complaining of moderate constant swelling and bruising surrounding his left eye following the incident. He denies pain to the eye. Pt states he fell while stepping off a step, hitting his head on the cement ground. He denies LOC, dizziness, acute vision changes and any other pain at this time. No Alleviating factors noted.      Past Medical History  Diagnosis Date  . Chronic systolic heart failure   . Rash and other nonspecific skin eruption   . Costochondritis, acute   . Glaucoma   . Erectile dysfunction   . Visual changes   . Pancytopenia   . Chronic pancreatitis     Based on CT findings  . History of melanoma in situ     JAN 2012--  LEFT HEEL W/ SLN BX  . Chronic back pain   . IC (interstitial cystitis)   . BPH (benign prostatic hypertrophy)   . Cardiac pacemaker     CARDIOLOGIST-- DR Cristopher Peru (LAST PACE [facility] 05-15-2013)  . Asymptomatic carotid artery stenosis     BILATERAL MILD ICA---  <50% PER DUPLEX 03-08-2008  . History of syncope   . Hypertension   . CHB (complete heart block)   . Nonischemic cardiomyopathy   . Severe mitral regurgitation   . Urge urinary incontinence   . A-fib   . Dementia    Past Surgical History  Procedure Laterality Date  . Cataract extraction, bilateral  left  1997/   right 2003  . Pacemaker insertion  12-06-2008  DR GREGG TAYLOR    BiV PPM  --  [employer]  . Colonoscopy  08/18/2006    UYQ:IHKV granularity and erosions of the rectum of uncertain significance biopsied.  A long  redundant, but otherwise normal-appearing colon.melanosi coli and minimal inflammation on bx  . Inguinal hernia repair Bilateral AS TEEN  . Interstim implant placement  2001  &  2007  . Cysto/ hod/  replacement interstim implant  05-14-2003  . Removal interstim implant/  cyst/  hod  04-14-2004  . Revision interstim implant and replace generator  05-15-2009    left upper buttock for urge urinary incontinence  . Wide excision left heel and sln bx  JUNE 2012  . Tonsillectomy  1950  . Left elbow surgery  2002  . Back surgery  X3  LAST ONE'90's  . Transthoracic echocardiogram  12-06-2008    LVSF 55-60%/  MODERATE MR/  MILD AR/  QUESTION DISTAL POSTERIOR HYPOKINESIS  . Interstim implant revision N/A 08/28/2013    Procedure: REPLACEMENT OF INTERSTIM-GENERATOR AND LEAD ;  Surgeon: [name], MD;  Location: [facility];  Service: Urology;  Laterality: N/A;  . Esophagogastroduodenoscopy N/A 03/28/2014    Dr. [name]: normal esophagus s/p dilation, mosaic appearance - chronic inflammation noted on bx  . Savory dilation N/A 03/28/2014    Procedure: SAVORY DILATION;  Surgeon: [name], MD;  [facility];  Service: Endoscopy;  Laterality: N/A;  Venia Minks dilation N/A 03/28/2014    Procedure: Venia Minks DILATION;  Surgeon: [name], MD;  [facility];  Service: Endoscopy;  Laterality: N/A;  . Colonoscopy N/A 08/05/2014    Procedure: COLONOSCOPY;  Surgeon: [name], MD;  [facility];  Service: Endoscopy;  Laterality: N/A;  PT NEEDS TCS AT 1000.  . Implantable cardioverter defibrillator (icd) generator change N/A 10/11/2014    Procedure: ICD GENERATOR CHANGE;  Surgeon: [name], MD;  Location: [facility] CATH LAB;  Service: Cardiovascular;  Laterality: N/A;   Family History  Problem Relation Age of Onset  . Dementia Mother   . Throat cancer Father   . Cancer Father 86    throat  . Lung cancer Sister   . Heart disease Brother    History   Substance Use Topics  . Smoking status: Former Smoker -- 0.02 packs/day    Types: Cigarettes    Quit date: 08/24/1997  . Smokeless tobacco: Not on file  . Alcohol Use: No    Review of Systems  Constitutional: Negative for fever, chills, diaphoresis, appetite change and fatigue.  HENT: Positive for facial swelling. Negative for mouth sores, sore throat and trouble swallowing.   Eyes: Negative for visual disturbance.  Respiratory: Negative for cough, chest tightness, shortness of breath and wheezing.   Cardiovascular: Negative for chest pain.  Gastrointestinal: Negative for nausea, vomiting, abdominal pain, diarrhea and abdominal distention.  Endocrine: Negative for polydipsia, polyphagia and polyuria.  Genitourinary: Negative for dysuria, frequency and hematuria.  Musculoskeletal: Negative for gait problem.  Skin: Positive for color change (Ecchymosis). Negative for pallor and rash.  Neurological: Negative for dizziness, syncope, light-headedness and headaches.  Hematological: Does not bruise/bleed easily.  Psychiatric/Behavioral: Negative for behavioral problems and confusion.  All other systems reviewed and are negative.     Allergies  Codeine and Penicillins  Home Medications   Prior to Admission medications   Medication Sig Start Date End Date Taking? Authorizing Provider  amitriptyline (ELAVIL) 10 MG tablet Take 10 mg by mouth at bedtime.   Yes Historical Provider, MD  amLODipine (NORVASC) 2.5 MG tablet TAKE 1 TABLET (2.5 MG TOTAL) BY MOUTH DAILY. 07/05/14  Yes Fayrene Helper, MD  dorzolamide-timolol (COSOPT) 22.3-6.8 MG/ML ophthalmic solution Place 1 drop into both eyes 2 (two) times daily.  10/13/10  Yes Historical Provider, MD  fentaNYL (DURAGESIC - DOSED MCG/HR) 25 MCG/HR patch Place 25 mcg onto the skin every 3 (three) days.   Yes Historical Provider, MD  ibuprofen (ADVIL,MOTRIN) 200 MG tablet Take 200 mg by mouth every 6 (six) hours as needed for moderate pain.     Yes Historical Provider, MD  latanoprost (XALATAN) 0.005 % ophthalmic solution Place 1 drop into both eyes at bedtime.  12/16/10  Yes Historical Provider, MD  Meth-Hyo-M Bl-Na Phos-Ph Sal (URIBEL) 118 MG CAPS Take 118 mg by mouth 3 (three) times daily as needed (bladder spasms).    Yes Historical Provider, MD  traMADol (ULTRAM) 50 MG tablet Take 50 mg by mouth daily as needed for moderate pain.  09/09/14  Yes Fayrene Helper, MD  traZODone (DESYREL) 50 MG tablet Take 25 mg by mouth at bedtime.   Yes Historical Provider, MD   BP 152/88 mmHg  Pulse 63  Temp(Src) 97.4 F (36.3 C) (Oral)  Resp 18  Ht 5\' 11"  (1.803 m)  Wt 160 lb (72.576 kg)  BMI 22.33 kg/m2  SpO2 98% Physical Exam  Constitutional: He is  oriented to person, place, and time. He appears well-developed and well-nourished. No distress.  HENT:  Head: Normocephalic.  Right Ear: No hemotympanum.  Left Ear: No hemotympanum.  No hemotympanum bilateral TMs   Eyes: Pupils are equal, round, and reactive to light. No scleral icterus.    Right eye conjunctival injection. Left complete periorbital ecchymosis.  Nml strength and senstaion of the face Reports nml vision right eye and Baseline vision (poor) left eye No blood over mastoids    Neck: Normal range of motion. Neck supple. No thyromegaly present.  Cardiovascular: Normal rate and regular rhythm.  Exam reveals no gallop and no friction rub.   No murmur heard. Pulmonary/Chest: Effort normal and breath sounds normal. No respiratory distress. He has no wheezes. He has no rales.  Abdominal: Soft. Bowel sounds are normal. He exhibits no distension. There is no tenderness. There is no rebound.  Musculoskeletal: Normal range of motion.  Neurological: He is alert and oriented to person, place, and time.  Skin: Skin is warm and dry. No rash noted.  Psychiatric: He has a normal mood and affect. His behavior is normal.  Nursing note and vitals reviewed.   ED Course  Procedures    DIAGNOSTIC STUDIES:  Oxygen Saturation is 98% on RA, normal by my interpretation.    COORDINATION OF CARE:  1:01 PM  Will order facial and head CT. Discussed treatment plan with pt and wife at bedside and pt agreed to plan.  Labs Review Labs Reviewed - No data to display  Imaging Review Ct Head Wo Contrast  11/09/2014   CLINICAL DATA:  Fall 4 days ago with injury to left supraorbital region. History melanoma.  EXAM: CT HEAD WITHOUT CONTRAST  CT MAXILLOFACIAL WITHOUT CONTRAST  TECHNIQUE: Multidetector CT imaging of the head and maxillofacial structures were performed using the standard protocol without intravenous contrast. Multiplanar CT image reconstructions of the maxillofacial structures were also generated.  COMPARISON:  Head CT 08/05/2014  FINDINGS: CT HEAD FINDINGS  Ventricles and cisterns are within normal. There is mild prominence of the CSF spaces compatible with age related atrophic change. There is no mass, mass effect, shift of midline structures or acute hemorrhage. There is no evidence of acute infarction. Bones soft tissues are unremarkable.  CT MAXILLOFACIAL FINDINGS  Minimal soft tissue swelling over the lateral left supraorbital region. Globes and retrobulbar spaces are normal. Paranasal sinuses are well developed and well aerated with mild mucosal membrane thickening over the left maxillary sinus. There is no acute fracture. No significant soft tissue injury. Mild deviation of the nasal septum to the right. There are moderate degenerative changes of the cervical spine.  IMPRESSION: No acute intracranial findings.  No acute facial bone fracture. Minimal soft tissue swelling over the lateral left supraorbital region.  Chronic inflammatory change of the left maxillary sinus.   Electronically Signed   By: Marin Olp M.D.   On: 11/09/2014 14:12   Ct Maxillofacial Wo Cm  11/09/2014   CLINICAL DATA:  Fall 4 days ago with injury to left supraorbital region. History melanoma.  EXAM: CT  HEAD WITHOUT CONTRAST  CT MAXILLOFACIAL WITHOUT CONTRAST  TECHNIQUE: Multidetector CT imaging of the head and maxillofacial structures were performed using the standard protocol without intravenous contrast. Multiplanar CT image reconstructions of the maxillofacial structures were also generated.  COMPARISON:  Head CT 08/05/2014  FINDINGS: CT HEAD FINDINGS  Ventricles and cisterns are within normal. There is mild prominence of the CSF spaces compatible with age related atrophic change.  There is no mass, mass effect, shift of midline structures or acute hemorrhage. There is no evidence of acute infarction. Bones soft tissues are unremarkable.  CT MAXILLOFACIAL FINDINGS  Minimal soft tissue swelling over the lateral left supraorbital region. Globes and retrobulbar spaces are normal. Paranasal sinuses are well developed and well aerated with mild mucosal membrane thickening over the left maxillary sinus. There is no acute fracture. No significant soft tissue injury. Mild deviation of the nasal septum to the right. There are moderate degenerative changes of the cervical spine.  IMPRESSION: No acute intracranial findings.  No acute facial bone fracture. Minimal soft tissue swelling over the lateral left supraorbital region.  Chronic inflammatory change of the left maxillary sinus.   Electronically Signed   By: Marin Olp M.D.   On: 11/09/2014 14:12     EKG Interpretation None      MDM   Final diagnoses:  Fall  Periorbital ecchymosis, left, initial encounter   I personally performed the services described in this documentation, which was scribed in my presence. The recorded information has been reviewed and is accurate.   Tanna Furry, MD 11/09/14 1454  Tanna Furry, MD 11/09/14 [phone]

## 2014-11-09 NOTE — Discharge Instructions (Signed)
No specific treatment.  Swelling and bruising will slowly resolve.  You may apply warm or cool compresses to the eye.

## 2014-11-09 NOTE — ED Notes (Signed)
Patient fell on Wednesday and hit head above left eye. Patient denies any LOC, dizziness, or blurred vision. Patient denies taking any blood thinners. Patient left orbit swollen and contused. Patient's sclera red and lower lid drooping. Per patient drainage from eye. Denies any pain. 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

## 2014-11-09 NOTE — ED Notes (Signed)
Patient with no complaints at this time. Respirations even and unlabored. Skin warm/dry. Discharge instructions reviewed with patient at this time. Patient given opportunity to voice concerns/ask questions. Patient discharged at this time and left Emergency Department with steady gait.   

## 2014-11-13 ENCOUNTER — Ambulatory Visit (INDEPENDENT_AMBULATORY_CARE_PROVIDER_SITE_OTHER): Payer: Medicare Other | Admitting: Physician Assistant

## 2014-11-13 ENCOUNTER — Encounter: Payer: Self-pay | Admitting: Physician Assistant

## 2014-11-13 VITALS — BP 140/82 | HR 42 | Ht 71.0 in | Wt 158.0 lb

## 2014-11-13 DIAGNOSIS — R296 Repeated falls: Secondary | ICD-10-CM | POA: Diagnosis not present

## 2014-11-13 DIAGNOSIS — I481 Persistent atrial fibrillation: Secondary | ICD-10-CM | POA: Diagnosis not present

## 2014-11-13 DIAGNOSIS — I5022 Chronic systolic (congestive) heart failure: Secondary | ICD-10-CM

## 2014-11-13 DIAGNOSIS — I4819 Other persistent atrial fibrillation: Secondary | ICD-10-CM

## 2014-11-13 DIAGNOSIS — R002 Palpitations: Secondary | ICD-10-CM

## 2014-11-13 DIAGNOSIS — I10 Essential (primary) hypertension: Secondary | ICD-10-CM

## 2014-11-13 MED ORDER — HYDROCHLOROTHIAZIDE 25 MG PO TABS: 25.0000 mg | ORAL_TABLET | Freq: Every day | ORAL | Status: DC

## 2014-11-13 MED ORDER — FUROSEMIDE 20 MG PO TABS: 20.0000 mg | ORAL_TABLET | Freq: Every day | ORAL | Status: DC

## 2014-11-13 NOTE — Assessment & Plan Note (Signed)
Blood Pressure stable 

## 2014-11-13 NOTE — Assessment & Plan Note (Signed)
Patient has recurrent heart failure. He doesn't like taking Lasix because it keeps him in the bathroom most of the day. Recommend Lasix 20 mg once daily for 3 days and then changed to HCTZ 25 mg once daily. I had along discussion concerning a 2 g sodium diet. Follow-up 2-D echo for LV function. Check lab next week. Follow-up with Dr. Lovena Le in 2 weeks.

## 2014-11-13 NOTE — Patient Instructions (Addendum)
Your physician recommends that you schedule a follow-up appointment in: 2 weeks with Ermalinda Barrios, PA-C, Dr. Lovena Le in 1 month  Your physician recommends that you return for lab work in: 1 week (BMET) on Wed. 11/20/14  Your physician has recommended you make the following change in your medication:   Start Lasix 20 mg daily for 3 days  Then start Hydroclorothiazide 25 mg Daily  Thank you for choosing [facility]!

## 2014-11-13 NOTE — Assessment & Plan Note (Signed)
Patient is no longer on amiodarone and her rate lowering medications

## 2014-11-13 NOTE — Assessment & Plan Note (Signed)
Patient had recent fall after missing a step coming out of her first run. He has a fair amount of bruising around his left eye. CT scan showed no acute abnormality.

## 2014-11-13 NOTE — Progress Notes (Signed)
Cardiology Office Note   Date:  11/13/2014   ID:  [name], DOB [birth year], MRN [id number]  PCP:  [name], MD  Cardiologist: Dr. Cristopher Peru  CC: shortness of breath    History of Present Illness: [name] is a 81 y.o. male that just had a  [employer] biV pacemaker change out by Dr. Lovena Le on 10/11/14. He has history of complete heart block and chronic systolic heart failure. He's had trouble taking Lasix in the past because he is in the bathroom all day long. He also has chronic atrial fibrillation and was on amiodarone and Xarelto but this was stopped because of a GI bleed.  The patient missed a step coming out of [place] [employer] when he wasn't using his cane and hit his head. He went to the emergency room on 11/09/14 and CT scan showed no acute injury. He does have significant left eye swelling and bruising. The patient denied syncope.  He comes in today complaining of worsening dyspnea on exertion. His wife says he is huffing and puffing all day long.He's also had some edema. He eats salted peanuts all day long and that you've a lot of canned foods. Last 2-D echo I found was from 2010.        Past Medical History  Diagnosis Date  . Chronic systolic heart failure   . Rash and other nonspecific skin eruption   . Costochondritis, acute   . Glaucoma   . Erectile dysfunction   . Visual changes   . Pancytopenia   . Chronic pancreatitis     Based on CT findings  . History of melanoma in situ     JAN 2012--  LEFT HEEL W/ SLN BX  . Chronic back pain   . IC (interstitial cystitis)   . BPH (benign prostatic hypertrophy)   . Cardiac pacemaker     CARDIOLOGIST-- DR Cristopher Peru (LAST PACE [facility] 05-15-2013)  . Asymptomatic carotid artery stenosis     BILATERAL MILD ICA---  <50% PER DUPLEX 03-08-2008  . History of syncope   . Hypertension   . CHB (complete heart block)   . Nonischemic cardiomyopathy   . Severe mitral regurgitation   . Urge  urinary incontinence   . A-fib   . Dementia     Past Surgical History  Procedure Laterality Date  . Cataract extraction, bilateral  left  1997/   right 2003  . Pacemaker insertion  12-06-2008  DR GREGG TAYLOR    BiV PPM  --  [employer]  . Colonoscopy  08/18/2006    RPR:XYVO granularity and erosions of the rectum of uncertain significance biopsied.  A long redundant, but otherwise normal-appearing colon.melanosi coli and minimal inflammation on bx  . Inguinal hernia repair Bilateral AS TEEN  . Interstim implant placement  2001  &  2007  . Cysto/ hod/  replacement interstim implant  05-14-2003  . Removal interstim implant/  cyst/  hod  04-14-2004  . Revision interstim implant and replace generator  05-15-2009    left upper buttock for urge urinary incontinence  . Wide excision left heel and sln bx  JUNE 2012  . Tonsillectomy  1950  . Left elbow surgery  2002  . Back surgery  X3  LAST ONE'90's  . Transthoracic echocardiogram  12-06-2008    LVSF 55-60%/  MODERATE MR/  MILD AR/  QUESTION DISTAL POSTERIOR HYPOKINESIS  . Interstim implant revision N/A 08/28/2013    Procedure: REPLACEMENT OF  INTERSTIM-GENERATOR AND LEAD ;  Surgeon: [name], MD;  Location: [facility];  Service: Urology;  Laterality: N/A;  . Esophagogastroduodenoscopy N/A 03/28/2014    Dr. [name]: normal esophagus s/p dilation, mosaic appearance - chronic inflammation noted on bx  . Savory dilation N/A 03/28/2014    Procedure: SAVORY DILATION;  Surgeon: [name], MD;  [facility];  Service: Endoscopy;  Laterality: N/A;  Venia Minks dilation N/A 03/28/2014    Procedure: Venia Minks DILATION;  Surgeon: [name], MD;  [facility];  Service: Endoscopy;  Laterality: N/A;  . Colonoscopy N/A 08/05/2014    Procedure: COLONOSCOPY;  Surgeon: [name], MD;  [facility];  Service: Endoscopy;  Laterality: N/A;  PT NEEDS TCS AT 1000.  . Implantable cardioverter  defibrillator (icd) generator change N/A 10/11/2014    Procedure: ICD GENERATOR CHANGE;  Surgeon: [name], MD;  Location: [facility] CATH LAB;  Service: Cardiovascular;  Laterality: N/A;     Current Outpatient Prescriptions  Medication Sig Dispense Refill  . amitriptyline (ELAVIL) 10 MG tablet Take 10 mg by mouth at bedtime.    Marland Kitchen amLODipine (NORVASC) 2.5 MG tablet TAKE 1 TABLET (2.5 MG TOTAL) BY MOUTH DAILY. 90 tablet 1  . dorzolamide-timolol (COSOPT) 22.3-6.8 MG/ML ophthalmic solution Place 1 drop into both eyes 2 (two) times daily.     . fentaNYL (DURAGESIC - DOSED MCG/HR) 25 MCG/HR patch Place 25 mcg onto the skin every 3 (three) days.    Marland Kitchen ibuprofen (ADVIL,MOTRIN) 200 MG tablet Take 200 mg by mouth every 6 (six) hours as needed for moderate pain.     Marland Kitchen latanoprost (XALATAN) 0.005 % ophthalmic solution Place 1 drop into both eyes at bedtime.     . Meth-Hyo-M Bl-Na Phos-Ph Sal (URIBEL) 118 MG CAPS Take 118 mg by mouth 3 (three) times daily as needed (bladder spasms).     . traMADol (ULTRAM) 50 MG tablet Take 50 mg by mouth daily as needed for moderate pain.  30 tablet 0  . traZODone (DESYREL) 50 MG tablet Take 25 mg by mouth at bedtime.     No current facility-administered medications for this visit.    Allergies:   Codeine and Penicillins    Social History:  The patient  reports that he quit smoking about 17 years ago. His smoking use included Cigarettes. He smoked 0.02 packs per day. He does not have any smokeless tobacco history on file. He reports that he does not drink alcohol or use illicit drugs.   Family History:  The patient'sfamily history includes Cancer (age of onset: 48) in his father; Dementia in his mother; Heart disease in his brother; Lung cancer in his sister; Throat cancer in his father.    ROS:  Please see the history of present illness.      All other systems are reviewed and negative.    PHYSICAL EXAM: VS:  BP 140/82 mmHg  Pulse 42  Ht 5\' 11"  (1.803 m)  Wt 158  lb (71.668 kg)  BMI 22.05 kg/m2 , BMI Body mass index is 22.05 kg/(m^2). GEN: Well nourished, well developed, in no acute distress HEENT:  left eye swollen and bruised from recent fall Neck:  increased  JVD,  nocarotid bruits, or masses Cardiac: ICD site looks good without hematoma or hemorrhage, Irregular irregular with 2/6 systolic murmur at the left sternal border, no  Rub patient has trace of  edema  Respiratory:   Decreased breath sounds with  bibasilar RalesI: soft, nontender, nondistended, + BS MS: no deformity or atrophy Skin: warm and dry, no rash Neuro:  Strength and sensation are intact Psych: euthymic mood, full affect   EKG:  EKG is ordered today. The ekg ordered today demonstrates AV paced rhythm at 74 bpm   Recent Labs: 08/04/2014: ALT 12 10/07/2014: BUN 18; Creatinine 0.98; Hemoglobin 10.5*; Platelets 119*; Potassium 3.8; Sodium 140    Lipid Panel    Component Value Date/Time   CHOL 179 06/19/2013 1241   TRIG 66 06/19/2013 1241   HDL 51 06/19/2013 1241   CHOLHDL 3.5 06/19/2013 1241   VLDL 13 06/19/2013 1241   LDLCALC 115* 06/19/2013 1241      Wt Readings from Last 3 Encounters:  11/13/14 158 lb (71.668 kg)  11/09/14 160 lb (72.576 kg)  10/17/14 156 lb 12.8 oz (71.124 kg)     CT scan 11/09/14: IMPRESSION: No acute intracranial findings.   No acute facial bone fracture. Minimal soft tissue swelling over the lateral left supraorbital region.   Chronic inflammatory change of the left maxillary sinus.     2-D echo from 2010:  LEFT VENTRICLE:  -  Question distal posterior hypokinesis.  -  Left ventricular size was normal.  -  Overall left ventricular systolic function was normal.  -  Left ventricular ejection fraction was estimated , range being 55        % to 65 %. Question distal posterior hypokinesis.  -  Left ventricular wall thickness was mildly increased.    Doppler interpretation(s):  -  Features were consistent with diastolic dysfunction.    AORTIC VALVE:  -  The aortic valve was trileaflet.  -  Aortic valve thickness was mildly increased.    Doppler interpretation(s):  -  There was mild aortic valvular regurgitation.   AORTA:  -  The aortic root was normal in size.   MITRAL VALVE:  -  Mitral valve is mildly thickened with moderate prolapse of the        anterior mitral leaflet with MR directed posteriorly along        wall of LA (1-2/4).    Doppler interpretation(s):  -  There was mild to moderate mitral valvular regurgitation.   LEFT ATRIUM:  -  Left atrial size was normal.   RIGHT VENTRICLE:  -  Right ventricular size was normal.  -  Right ventricular systolic function was normal.   PULMONIC VALVE:  -  The structure of the pulmonic valve appeared to be normal.    Doppler interpretation(s):  -  There was mild to moderate pulmonic regurgitation.   TRICUSPID VALVE:  -  The tricuspid valve structure was normal.    Doppler interpretation(s):  -  There was trivial tricuspid valvular regurgitation.   RIGHT ATRIUM:  -  Right atrial size was normal.   PERICARDIUM:  -  There was no pericardial effusion.    ---------------------------------------------------------------   SUMMARY  -  Question distal posterior hypokinesis. Overall left ventricular        systolic function was normal. Left ventricular ejection        fraction was estimated , range being 55 % to 65 %. Question        distal posterior hypokinesis.  -  There was mild aortic valvular regurgitation.  -  Mitral valve is mildly thickened with moderate prolapse of the        anterior mitral leaflet with MR directed posteriorly along  wall of LA (1-2/4). There was mild to moderate mitral        valvular regurgitation.     Signed, Ermalinda Barrios, PA-C  11/13/2014 2:56 PM    [facility] [place], [place],   [address] Phone: [phone]; Fax: [phone]

## 2014-11-15 ENCOUNTER — Ambulatory Visit (INDEPENDENT_AMBULATORY_CARE_PROVIDER_SITE_OTHER): Payer: Medicare Other | Admitting: Urology

## 2014-11-15 DIAGNOSIS — R351 Nocturia: Secondary | ICD-10-CM | POA: Diagnosis not present

## 2014-11-15 DIAGNOSIS — N3010 Interstitial cystitis (chronic) without hematuria: Secondary | ICD-10-CM | POA: Diagnosis not present

## 2014-11-15 DIAGNOSIS — R300 Dysuria: Secondary | ICD-10-CM

## 2014-11-22 DIAGNOSIS — M25512 Pain in left shoulder: Secondary | ICD-10-CM | POA: Diagnosis not present

## 2014-11-22 DIAGNOSIS — M546 Pain in thoracic spine: Secondary | ICD-10-CM | POA: Diagnosis not present

## 2014-11-22 DIAGNOSIS — M25511 Pain in right shoulder: Secondary | ICD-10-CM | POA: Diagnosis not present

## 2014-11-22 DIAGNOSIS — M542 Cervicalgia: Secondary | ICD-10-CM | POA: Diagnosis not present

## 2014-11-25 DIAGNOSIS — I5022 Chronic systolic (congestive) heart failure: Secondary | ICD-10-CM | POA: Diagnosis not present

## 2014-11-28 ENCOUNTER — Ambulatory Visit (INDEPENDENT_AMBULATORY_CARE_PROVIDER_SITE_OTHER): Payer: Medicare Other | Admitting: Adult Health

## 2014-11-28 ENCOUNTER — Encounter: Payer: Self-pay | Admitting: Adult Health

## 2014-11-28 VITALS — BP 138/78 | HR 75 | Ht 71.0 in | Wt 159.0 lb

## 2014-11-28 DIAGNOSIS — I10 Essential (primary) hypertension: Secondary | ICD-10-CM | POA: Diagnosis not present

## 2014-11-28 NOTE — Progress Notes (Signed)
Cardiology Office Note   Date:  11/28/2014   ID:  [name], DOB December 18, 1925, MRN [id number]  PCP:  [name], MD  Cardiologist:  Bryna Colander, NP   Chief Complaint  Patient presents with  . Congestive Heart Failure    Systolic  . Cardiomyopathy    BiV pacemaker in situ  . Hypertension      History of Present Illness: [name] is a 81 y.o. male who presents for we are following for ongoing assessment and management of chronic systolic heart failure, nonischemic cardiomyopathy, hypertension, with history of IV, permanent pacemaker insertion per Dr. Lovena Le.  ([employer]tr[employer]).  The patient was last seen by Dr. Lovena Le on 10/07/2014, and had developed worsening, atrial fibrillation.  He is not on anticoagulation due to 2 GI bleed.  He is also not on diuretic as he was unable to tolerate Lasix.  He was last seen by Dr. Lovena Le to discuss generator change.a new Medtronic by the pacemaker was placed on 10/16/2014.  Followup appointment with Sharyn Lull lens.  PA 2 weeks ago in the setting of recent fall, and fluid retention.  On that visit, the patient was asked to take Lasix 20 mg once a day for 3 days, and then begin HCTZ 25 mg daily.  Low sodium diet was discussed.  He is here for followup concerning his symptoms.  Weight on last office visit 158 pounds.  He comes today still confused about his medications. He is otherwise doing well. No further complaints.     Past Medical History  Diagnosis Date  . Chronic systolic heart failure   . Rash and other nonspecific skin eruption   . Costochondritis, acute   . Glaucoma   . Erectile dysfunction   . Visual changes   . Pancytopenia   . Chronic pancreatitis     Based on CT findings  . History of melanoma in situ     JAN 2012--  LEFT HEEL W/ SLN BX  . Chronic back pain   . IC (interstitial cystitis)   . BPH (benign prostatic hypertrophy)   . Cardiac pacemaker     CARDIOLOGIST-- DR Cristopher Peru (LAST PACE [facility]  05-15-2013)  . Asymptomatic carotid artery stenosis     BILATERAL MILD ICA---  <50% PER DUPLEX 03-08-2008  . History of syncope   . Hypertension   . CHB (complete heart block)   . Nonischemic cardiomyopathy   . Severe mitral regurgitation   . Urge urinary incontinence   . A-fib   . Dementia     Past Surgical History  Procedure Laterality Date  . Cataract extraction, bilateral  left  1997/   right 2003  . Pacemaker insertion  12-06-2008  DR GREGG TAYLOR    BiV PPM  --  [employer]  . Colonoscopy  08/18/2006    SAY:TKZS granularity and erosions of the rectum of uncertain significance biopsied.  A long redundant, but otherwise normal-appearing colon.melanosi coli and minimal inflammation on bx  . Inguinal hernia repair Bilateral AS TEEN  . Interstim implant placement  2001  &  2007  . Cysto/ hod/  replacement interstim implant  05-14-2003  . Removal interstim implant/  cyst/  hod  04-14-2004  . Revision interstim implant and replace generator  05-15-2009    left upper buttock for urge urinary incontinence  . Wide excision left heel and sln bx  JUNE 2012  . Tonsillectomy  1950  . Left elbow surgery  2002  . Back surgery  X3  LAST ONE'90's  . Transthoracic echocardiogram  12-06-2008    LVSF 55-60%/  MODERATE MR/  MILD AR/  QUESTION DISTAL POSTERIOR HYPOKINESIS  . Interstim implant revision N/A 08/28/2013    Procedure: REPLACEMENT OF INTERSTIM-GENERATOR AND LEAD ;  Surgeon: [name], MD;  Location: [place];  Service: Urology;  Laterality: N/A;  . Esophagogastroduodenoscopy N/A 03/28/2014    Dr. [name]: normal esophagus s/p dilation, mosaic appearance - chronic inflammation noted on bx  . Savory dilation N/A 03/28/2014    Procedure: SAVORY DILATION;  Surgeon: [name], MD;  [facility];  Service: Endoscopy;  Laterality: N/A;  Venia Minks dilation N/A 03/28/2014    Procedure: Venia Minks DILATION;  Surgeon: [name], MD;  [facility];  Service: Endoscopy;  Laterality: N/A;  . Colonoscopy N/A 08/05/2014    Procedure: COLONOSCOPY;  Surgeon: [name], MD;  Location: [facility];  Service: Endoscopy;  Laterality: N/A;  PT NEEDS TCS AT 1000.  . Implantable cardioverter defibrillator (icd) generator change N/A 10/11/2014    Procedure: ICD GENERATOR CHANGE;  Surgeon: [name], MD;  Location: [facility] CATH LAB;  Service: Cardiovascular;  Laterality: N/A;     Current Outpatient Prescriptions  Medication Sig Dispense Refill  . amitriptyline (ELAVIL) 10 MG tablet Take 10 mg by mouth at bedtime.    Marland Kitchen amLODipine (NORVASC) 2.5 MG tablet TAKE 1 TABLET (2.5 MG TOTAL) BY MOUTH DAILY. 90 tablet 1  . dorzolamide-timolol (COSOPT) 22.3-6.8 MG/ML ophthalmic solution Place 1 drop into both eyes 2 (two) times daily.     . fentaNYL (DURAGESIC - DOSED MCG/HR) 25 MCG/HR patch Place 25 mcg onto the skin every 3 (three) days.    . furosemide (LASIX) 20 MG tablet Take 1 tablet (20 mg total) by mouth daily. 3 tablet 0  . hydrochlorothiazide (HYDRODIURIL) 25 MG tablet Take 1 tablet (25 mg total) by mouth daily. 90 tablet 3  . ibuprofen (ADVIL,MOTRIN) 200 MG tablet Take 200 mg by mouth every 6 (six) hours as needed for moderate pain.     Marland Kitchen latanoprost (XALATAN) 0.005 % ophthalmic solution Place 1 drop into both eyes at bedtime.     . Meth-Hyo-M Bl-Na Phos-Ph Sal (URIBEL) 118 MG CAPS Take 118 mg by mouth 3 (three) times daily as needed (bladder spasms).     . traMADol (ULTRAM) 50 MG tablet Take 50 mg by mouth daily as needed for moderate pain.  30 tablet 0  . traZODone (DESYREL) 50 MG tablet Take 25 mg by mouth at bedtime.     No current facility-administered medications for this visit.    Allergies:   Codeine and Penicillins    Social History:  The patient  reports that he quit smoking about 17 years ago. His smoking use included Cigarettes. He smoked 0.02 packs per day. He has never used smokeless tobacco. He reports that he does not  drink alcohol or use illicit drugs.   Family History:  The patient's family history includes Cancer (age of onset: 14) in his father; Dementia in his mother; Heart disease in his brother; Lung cancer in his sister; Throat cancer in his father.    ROS:  Please see the history of present illness.   Otherwise, review of systems    All other systems are reviewed and negative.    PHYSICAL EXAM: VS:  BP 138/78 mmHg  Pulse 75  Ht 5\' 11"  (1.803 m)  Wt 72.122 kg (159 lb)  BMI  22.19 kg/m2  SpO2 92% , BMI Body mass index is 22.19 kg/(m^2). GEN: Well nourished, well developed, in no acute distress HEENT: normal Neck: no JVD, carotid bruits, or masses Cardiac: RRR; no murmurs, rubs, or gallops,no edema  Respiratory:  clear to auscultation bilaterally, normal work of breathing GI: soft, nontender, nondistended, + BS MS: no deformity or atrophy Skin: warm and dry, no rash Neuro:  Strength and sensation are intact Psych: euthymic mood, full affect   Recent Labs: 08/04/2014: ALT 12 10/07/2014: BUN 18; Creatinine 0.98; Hemoglobin 10.5*; Platelets 119*; Potassium 3.8; Sodium 140    Lipid Panel    Component Value Date/Time   CHOL 179 06/19/2013 1241   TRIG 66 06/19/2013 1241   HDL 51 06/19/2013 1241   CHOLHDL 3.5 06/19/2013 1241   VLDL 13 06/19/2013 1241   LDLCALC 115* 06/19/2013 1241      Wt Readings from Last 3 Encounters:  11/28/14 72.122 kg (159 lb)  11/13/14 71.668 kg (158 lb)  11/09/14 72.576 kg (160 lb)      Other studies Reviewed: Additional studies/ records that were reviewed today include: BMET Review of the above records demonstrates: Labs WNL   ASSESSMENT AND PLAN:  1. Hypertension: Well controlled. He is still confused on his medications. I will have [facility]N go by and see him to assist with clarification. I have written on the bottle what the medications are for The AVS will also be highlighted, He will decrease HCTZ to 12.5 mg daily,  2. BiV Pacemaker in situ: He will  continue to see Dr. Lovena Le for follow up.    Current medicines are reviewed at length with the patient today.  The patient some concerns regarding medicines. He is confused on what he takes. [facility] referral   The following changes have been made: HCTZ to 12.5 mg dialy.,  Labs/ tests ordered today include: BMET in 3 months.   Orders Placed This Encounter  Procedures  . Basic Metabolic Panel (BMET)     Disposition:   FU with 3 months   Signed, Jory Sims, NP  11/28/2014 1:46 PM    [facility] [address] [facility] Phone: [phone]

## 2014-11-28 NOTE — Progress Notes (Deleted)
Name: [name]    DOB: 1925-10-28  Age: 81 y.o.  MR#: [id number]       PCP:  [name], MD      Insurance: Payor: MEDICARE / Plan: MEDICARE PART A AND B / Product Type: *No Product type* /   CC:    Chief Complaint  Patient presents with  . Congestive Heart Failure    Systolic  . Cardiomyopathy    BiV pacemaker in situ  . Hypertension    VS Filed Vitals:   11/28/14 1313  BP: 138/78  Pulse: 75  Height: 5' 11"  (1.803 m)  Weight: 159 lb (72.122 kg)  SpO2: 92%    Weights Current Weight  11/28/14 159 lb (72.122 kg)  11/13/14 158 lb (71.668 kg)  11/09/14 160 lb (72.576 kg)    Blood Pressure  BP Readings from Last 3 Encounters:  11/28/14 138/78  11/13/14 140/82  11/09/14 152/88     Admit date:  (Not on file) Last encounter with RMR:  09/10/2014   Allergy Codeine and Penicillins  Current Outpatient Prescriptions  Medication Sig Dispense Refill  . amitriptyline (ELAVIL) 10 MG tablet Take 10 mg by mouth at bedtime.    Marland Kitchen amLODipine (NORVASC) 2.5 MG tablet TAKE 1 TABLET (2.5 MG TOTAL) BY MOUTH DAILY. 90 tablet 1  . dorzolamide-timolol (COSOPT) 22.3-6.8 MG/ML ophthalmic solution Place 1 drop into both eyes 2 (two) times daily.     . fentaNYL (DURAGESIC - DOSED MCG/HR) 25 MCG/HR patch Place 25 mcg onto the skin every 3 (three) days.    . furosemide (LASIX) 20 MG tablet Take 1 tablet (20 mg total) by mouth daily. 3 tablet 0  . hydrochlorothiazide (HYDRODIURIL) 25 MG tablet Take 1 tablet (25 mg total) by mouth daily. 90 tablet 3  . ibuprofen (ADVIL,MOTRIN) 200 MG tablet Take 200 mg by mouth every 6 (six) hours as needed for moderate pain.     Marland Kitchen latanoprost (XALATAN) 0.005 % ophthalmic solution Place 1 drop into both eyes at bedtime.     . Meth-Hyo-M Bl-Na Phos-Ph Sal (URIBEL) 118 MG CAPS Take 118 mg by mouth 3 (three) times daily as needed (bladder spasms).     . traMADol (ULTRAM) 50 MG tablet Take 50 mg by mouth daily as needed for moderate pain.  30 tablet 0  . traZODone  (DESYREL) 50 MG tablet Take 25 mg by mouth at bedtime.     No current facility-administered medications for this visit.    Discontinued Meds:   There are no discontinued medications.  Patient Active Problem List   Diagnosis Date Noted  . Medicare annual wellness visit, subsequent 10/17/2014  . Acute upper GI bleeding 08/02/2014  . Recurrent falls 08/01/2014  . Anemia due to other cause 08/01/2014  . Abrasion of hand, left 08/01/2014  . Need for vaccination with 13-polyvalent pneumococcal conjugate vaccine 05/26/2014  . Dysphagia, unspecified(787.20) 03/19/2014  . Chest pain 02/22/2014  . Atrial fibrillation, persistent 02/22/2014  . Abnormal weight loss 08/09/2013  . Anorexia nervosa 08/09/2013  . Heme positive stool 07/22/2013  . Bilateral leg weakness 05/01/2013  . Postural imbalance 05/01/2013  . Nocturia 07/10/2012  . Abnormal gait 06/12/2012  . Lower extremity weakness 05/31/2012  . GERD (gastroesophageal reflux disease) 05/09/2012  . Alzheimer's dementia 11/17/2011  . Malignant melanoma 03/03/2011  . Backache 02/18/2010  . GEN OSTEOARTHROSIS INVOLVING MULTIPLE SITES 11/25/2009  . CENTRAL HEARING LOSS 11/10/2009  . CARDIAC PACEMAKER IN SITU 07/22/2009  . INTERSTITIAL CYSTITIS 07/14/2009  . Pancytopenia 12/31/2008  .  MITRAL REGURGITATION, MODERATE 12/31/2008  . Chronic systolic heart failure 32/95/1884  . ERECTILE DYSFUNCTION 01/31/2008  . GLAUCOMA 01/31/2008  . Essential hypertension 01/31/2008    LABS    Component Value Date/Time   NA 140 10/07/2014 1355   NA 140 09/17/2014 1303   NA 140 08/04/2014 0622   K 3.8 10/07/2014 1355   K 3.5 09/17/2014 1303   K 4.3 08/04/2014 0622   CL 107 10/07/2014 1355   CL 102 09/17/2014 1303   CL 104 08/04/2014 0622   CO2 28 10/07/2014 1355   CO2 31 09/17/2014 1303   CO2 26 08/04/2014 0622   GLUCOSE 83 10/07/2014 1355   GLUCOSE 73 09/17/2014 1303   GLUCOSE 91 08/04/2014 0622   BUN 18 10/07/2014 1355   BUN 20  09/17/2014 1303   BUN 23 08/04/2014 0622   CREATININE 0.98 10/07/2014 1355   CREATININE 1.16 09/17/2014 1303   CREATININE 1.16 08/04/2014 0622   CREATININE 1.22 08/03/2014 0641   CREATININE 1.40* 08/02/2014 0948   CREATININE 1.31 08/01/2014 1534   CALCIUM 8.5 10/07/2014 1355   CALCIUM 8.3* 09/17/2014 1303   CALCIUM 8.2* 08/04/2014 0622   GFRNONAA 56* 08/04/2014 0622   GFRNONAA 52* 08/03/2014 0641   GFRNONAA 44* 08/02/2014 0948   GFRAA 64* 08/04/2014 0622   GFRAA 61* 08/03/2014 0641   GFRAA 51* 08/02/2014 0948   CMP     Component Value Date/Time   NA 140 10/07/2014 1355   K 3.8 10/07/2014 1355   CL 107 10/07/2014 1355   CO2 28 10/07/2014 1355   GLUCOSE 83 10/07/2014 1355   BUN 18 10/07/2014 1355   CREATININE 0.98 10/07/2014 1355   CREATININE 1.16 08/04/2014 0622   CALCIUM 8.5 10/07/2014 1355   PROT 5.6* 08/04/2014 0622   ALBUMIN 2.9* 08/04/2014 0622   AST 18 08/04/2014 0622   ALT 12 08/04/2014 0622   ALKPHOS 52 08/04/2014 0622   BILITOT 1.9* 08/04/2014 0758   GFRNONAA 56* 08/04/2014 0622   GFRAA 64* 08/04/2014 0622       Component Value Date/Time   WBC 2.3* 10/07/2014 1355   WBC 2.4* 08/04/2014 0622   WBC 2.4* 08/03/2014 0641   HGB 10.5* 10/07/2014 1355   HGB 9.7* 08/04/2014 0622   HGB 9.4* 08/03/2014 0641   HCT 30.7* 10/07/2014 1355   HCT 28.8* 08/04/2014 0622   HCT 28.2* 08/03/2014 0641   MCV 94.5 10/07/2014 1355   MCV 96.3 08/04/2014 0622   MCV 95.6 08/03/2014 0641    Lipid Panel     Component Value Date/Time   CHOL 179 06/19/2013 1241   TRIG 66 06/19/2013 1241   HDL 51 06/19/2013 1241   CHOLHDL 3.5 06/19/2013 1241   VLDL 13 06/19/2013 1241   LDLCALC 115* 06/19/2013 1241    ABG No results found for: PHART, PCO2ART, PO2ART, HCO3, TCO2, ACIDBASEDEF, O2SAT   Lab Results  Component Value Date   TSH 1.805 04/25/2013   BNP (last 3 results) No results for input(s): BNP in the last 8760 hours.  ProBNP (last 3 results) No results for input(s):  PROBNP in the last 8760 hours.  Cardiac Panel (last 3 results) No results for input(s): CKTOTAL, CKMB, TROPONINI, RELINDX in the last 72 hours.  Iron/TIBC/Ferritin/ %Sat    Component Value Date/Time   IRON 57 08/01/2014 1534   TIBC 325 02/22/2011 1702   FERRITIN 119 08/01/2014 1534   IRONPCTSAT 6* 02/22/2011 1702     EKG Orders placed or performed in visit on 11/13/14  .  EKG 12-Lead     Prior Assessment and Plan Problem List as of 11/28/2014      Cardiovascular and Mediastinum   MITRAL REGURGITATION, MODERATE   Last Assessment & Plan 08/08/2014 Office Visit Written 08/08/2014  4:06 PM by Lendon Colonel, NP    No apparent for repair or surgery at this time. Multiple comorbidities. Continue medical management.      Essential hypertension   Last Assessment & Plan 11/13/2014 Office Visit Written 11/13/2014  3:21 PM by Imogene Burn, PA-C    Blood Pressure stable      Chronic systolic heart failure   Last Assessment & Plan 11/13/2014 Office Visit Written 11/13/2014  3:20 PM by Imogene Burn, PA-C    Patient has recurrent heart failure. He doesn't like taking Lasix because it keeps him in the bathroom most of the day. Recommend Lasix 20 mg once daily for 3 days and then changed to HCTZ 25 mg once daily. I had along discussion concerning a 2 g sodium diet. Follow-up 2-D echo for LV function. Check lab next week. Follow-up with Dr. Lovena Le in 2 weeks.      Atrial fibrillation, persistent   Last Assessment & Plan 11/13/2014 Office Visit Written 11/13/2014  3:22 PM by Imogene Burn, PA-C    Patient is no longer on amiodarone and her rate lowering medications        Digestive   GERD (gastroesophageal reflux disease)   Last Assessment & Plan 09/03/2013 Office Visit Written 09/09/2013  2:31 PM by Fayrene Helper, MD    Controlled, no change in medication Followed by GI      Dysphagia, unspecified(787.20)   Last Assessment & Plan 03/19/2014 Office Visit Written 03/21/2014 11:36 PM  by Orvil Feil, NP    81 year old male with new-onset dysphagia. Vague historian but wife does note patient complains of hoarseness and notes his voice has changed. Father with history of throat cancer. Will proceed with upper endoscopy to rule out occult process; recommend subsequent ENT evaluation of negative EGD.   Proceed with upper endoscopy and dilation in the near future with Dr. [name]. The risks, benefits, and alternatives have been discussed in detail with patient. They have stated understanding and desire to proceed.  ENT referral if negative EGD      Acute upper GI bleeding     Nervous and Auditory   CENTRAL HEARING LOSS   Last Assessment & Plan 12/28/2013 Office Visit Written 12/29/2013  9:11 AM by Fayrene Helper, MD    needs hearing aids, will advise checking belltone  For help with this      Alzheimer's dementia   Last Assessment & Plan 10/07/2014 Office Visit Written 10/07/2014  1:30 PM by [name], MD    Today, he has minimal symptoms and is not agitated. Will follow.      Lower extremity weakness   Last Assessment & Plan 11/09/2013 Office Visit Written 11/10/2013  3:15 PM by Fayrene Helper, MD    Improved following recent physical therapy sessions, also ahs not been using pain medication regularly      Bilateral leg weakness   Last Assessment & Plan 12/28/2013 Office Visit Written 12/29/2013  9:09 AM by Fayrene Helper, MD    Continued c/o lower extremity weakness, no falls, no interest in therapy currently        Musculoskeletal and Integument   GEN OSTEOARTHROSIS INVOLVING MULTIPLE SITES   Last Assessment & Plan 05/20/2014 Office Visit Written  05/26/2014  5:43 PM by Fayrene Helper, MD    Ongoing chronic pain in multiple joints with limitation in mobility Pt to use fenatnyl q 3 days 65mg dose , discouraged from continuing tramadol along with this      Abrasion of hand, left   Last Assessment & Plan 08/01/2014 Office Visit Written 08/01/2014 10:19 PM by  MFayrene Helper MD    H/o hand trauma with slow healing and persistent scab, antibiotic ointment given for twice daily use fo next 5 to 7 days, if persists will need dermatology eval, had multiple myeloma on his foot which presented in a similar fashion        Genitourinary   INTERSTITIAL CYSTITIS   Last Assessment & Plan 11/09/2013 Office Visit Written 11/10/2013  3:17 PM by MFayrene Helper MD    C/o nocturia which disturbs his sleep, no improvement with recent procedure reportedly. I have advised him to try half valium at bedtime, urology had prescribed 3 times daily, he has not been taking this, and I suspect he would be unable to tolerate that high a dose. UA in the office is negative for infection. F/U with urology is to be arranged        Hematopoietic and Hemostatic   Pancytopenia   Last Assessment & Plan 03/01/2013 Office Visit Written 03/03/2013  3:04 PM by MFayrene Helper MD    Improved numbers, followed by hematology        Other   ERECTILE DYSFUNCTION   Last Assessment & Plan 05/10/2011 Office Visit Written 05/10/2011 10:00 AM by MFayrene Helper MD    Pt states that the cialis is not working as well, he still has a lot, advised him to discuss further with his urologist      [place] Office Visit Written 09/09/2013  2:35 PM by MFayrene Helper MD    The importance of regular eye exam and compliance with treatment is stressed      Backache   Last Assessment & Plan 11/09/2013 Office Visit Written 11/10/2013  3:18 PM by MFayrene Helper MD    Improved with recent therapy. Will use tramadol 50 mg one daily, only if needed      [place]1/01/2015 Office Visit Written 10/07/2014  1:30 PM by GEvans Lance MD    He has reached ERI on his current device. Will schedule for PPM generator change.      Malignant melanoma   Last Assessment & Plan 05/10/2011 Office Visit Written 05/12/2011  8:50 AM  by MFayrene Helper MD    [facility]     Abnormal gait   Nocturia   Last Assessment & Plan 12/28/2013 Office Visit Written 12/29/2013  9:07 AM by MFayrene Helper MD    Anxiety, poor sleep and frequent urination at night, no infection in urine. Trial of amitriptyline low dose      Postural imbalance   Heme positive stool   Last Assessment & Plan 11/01/2013 Office Visit Written 11/01/2013  1:43 PM by LMahala Menghini PA-C    81year old gentleman with history of Hemoccult-positive stool, pancytopenia. Was not able to undergo colonoscopy because he did not refrain from eating solid food the day before his procedure. His constipation has resolved. His appetite has improved. He has gained 3 pounds. At this time he is not interested in pursuing  a colonoscopy or upper endoscopy. We discussed that his Hemoccult-positive stool may be completely benign finding from benign anorectal disease but cannot exclude possibility of advanced polyp or malignancy. He voiced understanding that delay in workup may negatively affect his outcome. He is adamant about avoiding endoscopic evaluation. I note that he is most recent hemoglobin in November was very similar to that over the past several years.   Repeat CBC in April. Further recommendations at that point.      Abnormal weight loss   Anorexia nervosa   Chest pain   Last Assessment & Plan 02/22/2014 Office Visit Written 02/22/2014 11:36 AM by [name], MD    The etiology is unclear. It has resolved. As he has not been thought to have CAD in the past, I am not inclined to work up again. We will give him ntg.      Need for vaccination with 13-polyvalent pneumococcal conjugate vaccine   Last Assessment & Plan 05/20/2014 Office Visit Written 05/26/2014  5:44 PM by Fayrene Helper, MD    Vaccine administered      Recurrent falls   Last Assessment & Plan 11/13/2014 Office Visit Written 11/13/2014  3:22 PM by Imogene Burn, PA-C    Patient had recent fall after missing a step coming out of her first run. He has a fair amount of bruising around his left eye. CT scan showed no acute abnormality.      Anemia due to other cause   Last Assessment & Plan 08/01/2014 Office Visit Written 08/01/2014 10:16 PM by Fayrene Helper, MD    Pt to return in am for testing for heme positive stool as e is on xarelto, need to ensure bleed is not internal as well as the obvious bruise from recurrent trauma If heme neg, will transfuse as out pt ,  I called and explained to his spouse to bring him to office for further management, and arrangements have been started for outpatient  transfusion      Medicare annual wellness visit, subsequent   Last Assessment & Plan 10/17/2014 Office Visit Written 10/17/2014 11:43 AM by Fayrene Helper, MD    Annual exam as documented. Counseling done  re healthy lifestyle involving commitment to 150 minutes exercise per week, heart healthy diet, and attaining healthy weight.The importance of adequate sleep also discussed. Regular seat belt use and home safety, is also discussed. Changes in health habits are decided on by the patient with goals and time frames  set for achieving them. Immunization and cancer screening needs are specifically addressed at this visit.           Imaging: Ct Head Wo Contrast  11/09/2014   CLINICAL DATA:  Fall 4 days ago with injury to left supraorbital region. History melanoma.  EXAM: CT HEAD WITHOUT CONTRAST  CT MAXILLOFACIAL WITHOUT CONTRAST  TECHNIQUE: Multidetector CT imaging of the head and maxillofacial structures were performed using the standard protocol without intravenous contrast. Multiplanar CT image reconstructions of the maxillofacial structures were also generated.  COMPARISON:  Head CT 08/05/2014  FINDINGS: CT HEAD FINDINGS  Ventricles and cisterns are within normal. There is mild prominence of the CSF spaces compatible with age related atrophic  change. There is no mass, mass effect, shift of midline structures or acute hemorrhage. There is no evidence of acute infarction. Bones soft tissues are unremarkable.  CT MAXILLOFACIAL FINDINGS  Minimal soft tissue swelling over the lateral left supraorbital region. Globes and retrobulbar spaces are  normal. Paranasal sinuses are well developed and well aerated with mild mucosal membrane thickening over the left maxillary sinus. There is no acute fracture. No significant soft tissue injury. Mild deviation of the nasal septum to the right. There are moderate degenerative changes of the cervical spine.  IMPRESSION: No acute intracranial findings.  No acute facial bone fracture. Minimal soft tissue swelling over the lateral left supraorbital region.  Chronic inflammatory change of the left maxillary sinus.   Electronically Signed   By: Marin Olp M.D.   On: 11/09/2014 14:12   Ct Maxillofacial Wo Cm  11/09/2014   CLINICAL DATA:  Fall 4 days ago with injury to left supraorbital region. History melanoma.  EXAM: CT HEAD WITHOUT CONTRAST  CT MAXILLOFACIAL WITHOUT CONTRAST  TECHNIQUE: Multidetector CT imaging of the head and maxillofacial structures were performed using the standard protocol without intravenous contrast. Multiplanar CT image reconstructions of the maxillofacial structures were also generated.  COMPARISON:  Head CT 08/05/2014  FINDINGS: CT HEAD FINDINGS  Ventricles and cisterns are within normal. There is mild prominence of the CSF spaces compatible with age related atrophic change. There is no mass, mass effect, shift of midline structures or acute hemorrhage. There is no evidence of acute infarction. Bones soft tissues are unremarkable.  CT MAXILLOFACIAL FINDINGS  Minimal soft tissue swelling over the lateral left supraorbital region. Globes and retrobulbar spaces are normal. Paranasal sinuses are well developed and well aerated with mild mucosal membrane thickening over the left maxillary sinus. There  is no acute fracture. No significant soft tissue injury. Mild deviation of the nasal septum to the right. There are moderate degenerative changes of the cervical spine.  IMPRESSION: No acute intracranial findings.  No acute facial bone fracture. Minimal soft tissue swelling over the lateral left supraorbital region.  Chronic inflammatory change of the left maxillary sinus.   Electronically Signed   By: Marin Olp M.D.   On: 11/09/2014 14:12

## 2014-11-28 NOTE — Patient Instructions (Signed)
Your physician recommends that you schedule a follow-up appointment in: 3 months with Jory Sims, NP  Your physician recommends that you return for lab work 1 week before your next visit.   Thank you for choosing [facility]!

## 2014-12-11 ENCOUNTER — Other Ambulatory Visit: Payer: Self-pay | Admitting: Internal Medicine

## 2014-12-24 ENCOUNTER — Other Ambulatory Visit: Payer: Self-pay

## 2014-12-24 NOTE — Patient Outreach (Signed)
   12/24/2014   [name] [birth year] [id number]   RN CM spoke with patient regarding a referral from his primary doctor's office.  Explained to patient the services of [facility].  Explained to patient that his doctor was concerned about how he was managing his high blood pressure and his heart failure.  Patient states he is taking his medications as ordered.  States he prepares his own medications.  RN CM reviewed patient's medication list with him.  Patient states he has trouble with urinary urgency and does not take his fluid medication.  States he has discussed this with his heart doctor, Dr. Lovena Le. Patient states his concern is back pain.  States he has had several back surgeries.  States he uses a pain patch to control his pain.  States presently he is out of the Duragesic pain patch and has placed an order with drugstore.  States he should be receiving patch in the mail soon.  RN CM asked was he having any issues with his heart.  Patient stated he had his pacemaker replaced 6-8 months ago and all is well.  States he keeps his appointment with his cardiologist.  Patient states he did not need a nurse coming to see him.  Patient declines the services of [facility].  RN CM was able to get patient agree to having EMMI education material mailed to him related to his chronic diseases.   RN CM will send a letter to patient and notify primary doctor of patient's decision.   [name], RN, Ishmael Holter, [place] Telephonic Care Coordinator [phone]

## 2014-12-30 ENCOUNTER — Encounter: Payer: Self-pay | Admitting: Adult Health

## 2014-12-30 ENCOUNTER — Ambulatory Visit (HOSPITAL_COMMUNITY)
Admission: RE | Admit: 2014-12-30 | Discharge: 2014-12-30 | Disposition: A | Discharge status: Home / Self Care | Payer: Medicare Other | Source: Ambulatory Visit | Attending: Adult Health | Admitting: Adult Health

## 2014-12-30 ENCOUNTER — Telehealth: Payer: Self-pay | Admitting: Adult Health

## 2014-12-30 ENCOUNTER — Telehealth: Payer: Self-pay

## 2014-12-30 ENCOUNTER — Ambulatory Visit (INDEPENDENT_AMBULATORY_CARE_PROVIDER_SITE_OTHER): Payer: Medicare Other | Admitting: Adult Health

## 2014-12-30 VITALS — BP 138/84 | HR 69 | Ht 71.0 in | Wt 160.0 lb

## 2014-12-30 DIAGNOSIS — R0602 Shortness of breath: Secondary | ICD-10-CM

## 2014-12-30 DIAGNOSIS — R0782 Intercostal pain: Secondary | ICD-10-CM

## 2014-12-30 DIAGNOSIS — R079 Chest pain, unspecified: Secondary | ICD-10-CM | POA: Diagnosis not present

## 2014-12-30 DIAGNOSIS — Z87891 Personal history of nicotine dependence: Secondary | ICD-10-CM | POA: Insufficient documentation

## 2014-12-30 NOTE — Patient Outreach (Signed)
[facility]) Care Management  12/30/2014  [name] 02/18/1926 [id number]   Received notification from [name], RN to close case due to patient refused to participate with [facility] at this time.  Case has been closed.  [name]. [place] CM Assistant Phone: [phone] Fax: [phone]

## 2014-12-30 NOTE — Progress Notes (Signed)
Cardiology Office Note   Date:  12/30/2014   ID:  [name], DOB [birth year], MRN [id number]  PCP:  [name], MD  Cardiologist:  Bryna Colander, NP   Chief Complaint  Patient presents with  . Chest Pain  . Shortness of Breath      History of Present Illness: [name] is a 81 y.o. male who presents for ongoing assessment and management of chronic systolic CHF, NICM, hypertension, and PPM per Dr. Lovena Le in January of 2016. He comes today complaining of chronic pain around PPM site, and some dyspnea. He has not been taking lasix or HCTZ as directed because it makes him urinate too muich. He has ben referred to [facility] for assistance in medication management and when they called he refused their assistance. He states he is only willing to take pain medications, and patch, amlodipine, and trazodone. His wife state she has noticed him breathing harder lately. He has not been having substantial wt gain or edema. Apparently there is a son who is not getting along with the patient's wife who insists that he be seen today.    Past Medical History  Diagnosis Date  . Chronic systolic heart failure   . Rash and other nonspecific skin eruption   . Costochondritis, acute   . Glaucoma   . Erectile dysfunction   . Visual changes   . Pancytopenia   . Chronic pancreatitis     Based on CT findings  . History of melanoma in situ     JAN 2012--  LEFT HEEL W/ SLN BX  . Chronic back pain   . IC (interstitial cystitis)   . BPH (benign prostatic hypertrophy)   . Cardiac pacemaker     CARDIOLOGIST-- DR Cristopher Peru (LAST PACE [facility] 05-15-2013)  . Asymptomatic carotid artery stenosis     BILATERAL MILD ICA---  <50% PER DUPLEX 03-08-2008  . History of syncope   . Hypertension   . CHB (complete heart block)   . Nonischemic cardiomyopathy   . Severe mitral regurgitation   . Urge urinary incontinence   . A-fib   . Dementia     Past Surgical History  Procedure Laterality Date  .  Cataract extraction, bilateral  left  1997/   right 2003  . Pacemaker insertion  12-06-2008  DR GREGG TAYLOR    BiV PPM  --  [employer]  . Colonoscopy  08/18/2006    YQM:GNOI granularity and erosions of the rectum of uncertain significance biopsied.  A long redundant, but otherwise normal-appearing colon.melanosi coli and minimal inflammation on bx  . Inguinal hernia repair Bilateral AS TEEN  . Interstim implant placement  2001  &  2007  . Cysto/ hod/  replacement interstim implant  05-14-2003  . Removal interstim implant/  cyst/  hod  04-14-2004  . Revision interstim implant and replace generator  05-15-2009    left upper buttock for urge urinary incontinence  . Wide excision left heel and sln bx  JUNE 2012  . Tonsillectomy  1950  . Left elbow surgery  2002  . Back surgery  X3  LAST ONE'90's  . Transthoracic echocardiogram  12-06-2008    LVSF 55-60%/  MODERATE MR/  MILD AR/  QUESTION DISTAL POSTERIOR HYPOKINESIS  . Interstim implant revision N/A 08/28/2013    Procedure: REPLACEMENT OF INTERSTIM-GENERATOR AND LEAD ;  Surgeon: [name], MD;  Location: [facility];  Service: Urology;  Laterality: N/A;  . Esophagogastroduodenoscopy N/A 03/28/2014    Dr.  Rourk: normal esophagus s/p dilation, mosaic appearance - chronic inflammation noted on bx  . Savory dilation N/A 03/28/2014    Procedure: SAVORY DILATION;  Surgeon: [name], MD;  [facility];  Service: Endoscopy;  Laterality: N/A;  Venia Minks dilation N/A 03/28/2014    Procedure: Venia Minks DILATION;  Surgeon: [name], MD;  [facility];  Service: Endoscopy;  Laterality: N/A;  . Colonoscopy N/A 08/05/2014    Procedure: COLONOSCOPY;  Surgeon: [name], MD;  [facility];  Service: Endoscopy;  Laterality: N/A;  PT NEEDS TCS AT 1000.  . Implantable cardioverter defibrillator (icd) generator change N/A 10/11/2014    Procedure: ICD GENERATOR CHANGE;  Surgeon: [name], MD;   Location[facility] C[facility];  Service: Cardiovascular;  Laterality: N/A;     Current Outpatient Prescriptions  Medication Sig Dispense Refill  . amitriptyline (ELAVIL) 10 MG tablet Take 10 mg by mouth at bedtime.    Marland Kitchen amLODipine (NORVASC) 2.5 MG tablet TAKE 1 TABLET (2.5 MG TOTAL) BY MOUTH DAILY. 90 tablet 1  . dorzolamide-timolol (COSOPT) 22.3-6.8 MG/ML ophthalmic solution Place 1 drop into both eyes 2 (two) times daily.     . fentaNYL (DURAGESIC - DOSED MCG/HR) 25 MCG/HR patch Place 25 mcg onto the skin every 3 (three) days.    . furosemide (LASIX) 20 MG tablet Take 1 tablet (20 mg total) by mouth daily. 3 tablet 0  . hydrochlorothiazide (HYDRODIURIL) 25 MG tablet Take 1 tablet (25 mg total) by mouth daily. 90 tablet 3  . ibuprofen (ADVIL,MOTRIN) 200 MG tablet Take 200 mg by mouth every 6 (six) hours as needed for moderate pain.     Marland Kitchen latanoprost (XALATAN) 0.005 % ophthalmic solution Place 1 drop into both eyes at bedtime.     . Meth-Hyo-M Bl-Na Phos-Ph Sal (URIBEL) 118 MG CAPS Take 118 mg by mouth 3 (three) times daily as needed (bladder spasms).     . traMADol (ULTRAM) 50 MG tablet Take 50 mg by mouth daily as needed for moderate pain.  30 tablet 0  . traZODone (DESYREL) 50 MG tablet Take 25 mg by mouth at bedtime.     No current facility-administered medications for this visit.    Allergies:   Codeine and Penicillins    Social History:  The patient  reports that he quit smoking about 17 years ago. His smoking use included Cigarettes. He smoked 0.02 packs per day. He has never used smokeless tobacco. He reports that he does not drink alcohol or use illicit drugs.   Family History:  The patient's family history includes Cancer (age of onset: 11) in his father; Dementia in his mother; Heart disease in his brother; Lung cancer in his sister; Throat cancer in his father.    ROS: .   All other systems are reviewed and negative.Unless otherwise mentioned in H&P above.   PHYSICAL EXAM: VS:   BP 138/84 mmHg  Pulse 69  Ht 5\' 11"  (1.803 m)  Wt 160 lb (72.576 kg)  BMI 22.33 kg/m2  SpO2 93% , BMI Body mass index is 22.33 kg/(m^2). GEN: Well nourished, well developed, in no acute distress HEENT: normal Neck: no JVD, carotid bruits, or masses Cardiac: RRR; 2/6 holosystolic murmur at the apex., no murmurs and LSB Lungs: No increased work of breathing GI: soft, nontender, nondistended, + BS MS: no deformity or atrophy Skin: warm and dry, no rash Neuro:  Strength and sensation are intact Psych: euthymic mood, full affect.  Recent Labs: 08/04/2014: ALT 12 10/07/2014: BUN 18; Creatinine 0.98; Hemoglobin 10.5*; Platelets 119*; Potassium 3.8; Sodium 140    Lipid Panel    Component Value Date/Time   CHOL 179 06/19/2013 1241   TRIG 66 06/19/2013 1241   HDL 51 06/19/2013 1241   CHOLHDL 3.5 06/19/2013 1241   VLDL 13 06/19/2013 1241   LDLCALC 115* 06/19/2013 1241      Wt Readings from Last 3 Encounters:  12/30/14 160 lb (72.576 kg)  11/28/14 159 lb (72.122 kg)  11/13/14 158 lb (71.668 kg)       ASSESSMENT AND PLAN:  1. Chronic Systolic CHF: He is not willing to take diuretics daily, stating that he urinates too much. He does not appear to be overtly fluid overloaded. He has cracklles bibiaslir. I will check CXR CBC in the setting of anemia with mild dyspnea and pain. BMET to assess kidney fx. I have asked him to take 12.5 mg HCTZ in am to assist with BP and breathing  status.  Will see him in one month.   2. Chest Pain: Chronic for him. He states this occurred after pacemaker insertion. He also has chronic back pain. He is on Fentanyl patch and po meds for pain. I will refer him to Dr. Moshe Cipro for ongoing management of pain.   3. Medical Non-compliance: Refuses to take diuretics. I have asked [facility] to see him to explain about medications and indications as he is still confused but he has refused their assistance. Wife would like them to come out again so that she can talk to  them.    Current medicines are reviewed at length with the patient today.    Labs/ tests ordered today include: Orders Placed This Encounter  Procedures  . DG Chest 2 View  . CBC  . Basic Metabolic Panel (BMET)     Disposition:   FU with 1 month  Signed, Jory Sims, NP  12/30/2014 3:50 PM    [place]. [address] [place], [place] [address] Phone: [phone]; Fax: [phone]

## 2014-12-30 NOTE — Patient Instructions (Addendum)
Your physician recommends that you schedule a follow-up appointment in: 1 month with Jory Sims NP   Please take  1/2 (12.5 mg ) tablet tomorrow morning of your HCTZ   Please get chest x-ray and blood work TODAY    Thank you for choosing [facility] !

## 2014-12-30 NOTE — Progress Notes (Deleted)
Name: [name]    DOB: 08-15-1926  Age: 81 y.o.  MR#: [id number]       PCP:  [name], MD      Insurance: Payor: MEDICARE / Plan: MEDICARE PART A AND B / Product Type: *No Product type* /   CC:   No chief complaint on file.   VS Filed Vitals:   12/30/14 1518  BP: 138/84  Pulse: 69  Height: 5' 11"  (1.803 m)  Weight: 160 lb (72.576 kg)  SpO2: 93%    Weights Current Weight  12/30/14 160 lb (72.576 kg)  11/28/14 159 lb (72.122 kg)  11/13/14 158 lb (71.668 kg)    Blood Pressure  BP Readings from Last 3 Encounters:  12/30/14 138/84  11/28/14 138/78  11/13/14 140/82     Admit date:  (Not on file) Last encounter with RMR:  12/30/2014   Allergy Codeine and Penicillins  Current Outpatient Prescriptions  Medication Sig Dispense Refill  . amitriptyline (ELAVIL) 10 MG tablet Take 10 mg by mouth at bedtime.    Marland Kitchen amLODipine (NORVASC) 2.5 MG tablet TAKE 1 TABLET (2.5 MG TOTAL) BY MOUTH DAILY. 90 tablet 1  . dorzolamide-timolol (COSOPT) 22.3-6.8 MG/ML ophthalmic solution Place 1 drop into both eyes 2 (two) times daily.     . fentaNYL (DURAGESIC - DOSED MCG/HR) 25 MCG/HR patch Place 25 mcg onto the skin every 3 (three) days.    . furosemide (LASIX) 20 MG tablet Take 1 tablet (20 mg total) by mouth daily. 3 tablet 0  . hydrochlorothiazide (HYDRODIURIL) 25 MG tablet Take 1 tablet (25 mg total) by mouth daily. 90 tablet 3  . ibuprofen (ADVIL,MOTRIN) 200 MG tablet Take 200 mg by mouth every 6 (six) hours as needed for moderate pain.     Marland Kitchen latanoprost (XALATAN) 0.005 % ophthalmic solution Place 1 drop into both eyes at bedtime.     . Meth-Hyo-M Bl-Na Phos-Ph Sal (URIBEL) 118 MG CAPS Take 118 mg by mouth 3 (three) times daily as needed (bladder spasms).     . traMADol (ULTRAM) 50 MG tablet Take 50 mg by mouth daily as needed for moderate pain.  30 tablet 0  . traZODone (DESYREL) 50 MG tablet Take 25 mg by mouth at bedtime.     No current facility-administered medications for this  visit.    Discontinued Meds:   There are no discontinued medications.  Patient Active Problem List   Diagnosis Date Noted  . Medicare annual wellness visit, subsequent 10/17/2014  . Acute upper GI bleeding 08/02/2014  . Recurrent falls 08/01/2014  . Anemia due to other cause 08/01/2014  . Abrasion of hand, left 08/01/2014  . Need for vaccination with 13-polyvalent pneumococcal conjugate vaccine 05/26/2014  . Dysphagia, unspecified(787.20) 03/19/2014  . Chest pain 02/22/2014  . Atrial fibrillation, persistent 02/22/2014  . Abnormal weight loss 08/09/2013  . Anorexia nervosa 08/09/2013  . Heme positive stool 07/22/2013  . Bilateral leg weakness 05/01/2013  . Postural imbalance 05/01/2013  . Nocturia 07/10/2012  . Abnormal gait 06/12/2012  . Lower extremity weakness 05/31/2012  . GERD (gastroesophageal reflux disease) 05/09/2012  . Alzheimer's dementia 11/17/2011  . Malignant melanoma 03/03/2011  . Backache 02/18/2010  . GEN OSTEOARTHROSIS INVOLVING MULTIPLE SITES 11/25/2009  . CENTRAL HEARING LOSS 11/10/2009  . CARDIAC PACEMAKER IN SITU 07/22/2009  . INTERSTITIAL CYSTITIS 07/14/2009  . Pancytopenia 12/31/2008  . MITRAL REGURGITATION, MODERATE 12/31/2008  . Chronic systolic heart failure 38/87/1959  . ERECTILE DYSFUNCTION 01/31/2008  . GLAUCOMA 01/31/2008  . Essential  hypertension 01/31/2008    LABS    Component Value Date/Time   NA 140 10/07/2014 1355   NA 140 09/17/2014 1303   NA 140 08/04/2014 0622   K 3.8 10/07/2014 1355   K 3.5 09/17/2014 1303   K 4.3 08/04/2014 0622   CL 107 10/07/2014 1355   CL 102 09/17/2014 1303   CL 104 08/04/2014 0622   CO2 28 10/07/2014 1355   CO2 31 09/17/2014 1303   CO2 26 08/04/2014 0622   GLUCOSE 83 10/07/2014 1355   GLUCOSE 73 09/17/2014 1303   GLUCOSE 91 08/04/2014 0622   BUN 18 10/07/2014 1355   BUN 20 09/17/2014 1303   BUN 23 08/04/2014 0622   CREATININE 0.98 10/07/2014 1355   CREATININE 1.16 09/17/2014 1303    CREATININE 1.16 08/04/2014 0622   CREATININE 1.22 08/03/2014 0641   CREATININE 1.40* 08/02/2014 0948   CREATININE 1.31 08/01/2014 1534   CALCIUM 8.5 10/07/2014 1355   CALCIUM 8.3* 09/17/2014 1303   CALCIUM 8.2* 08/04/2014 0622   GFRNONAA 56* 08/04/2014 0622   GFRNONAA 52* 08/03/2014 0641   GFRNONAA 44* 08/02/2014 0948   GFRAA 64* 08/04/2014 0622   GFRAA 61* 08/03/2014 0641   GFRAA 51* 08/02/2014 0948   CMP     Component Value Date/Time   NA 140 10/07/2014 1355   K 3.8 10/07/2014 1355   CL 107 10/07/2014 1355   CO2 28 10/07/2014 1355   GLUCOSE 83 10/07/2014 1355   BUN 18 10/07/2014 1355   CREATININE 0.98 10/07/2014 1355   CREATININE 1.16 08/04/2014 0622   CALCIUM 8.5 10/07/2014 1355   PROT 5.6* 08/04/2014 0622   ALBUMIN 2.9* 08/04/2014 0622   AST 18 08/04/2014 0622   ALT 12 08/04/2014 0622   ALKPHOS 52 08/04/2014 0622   BILITOT 1.9* 08/04/2014 0758   GFRNONAA 56* 08/04/2014 0622   GFRAA 64* 08/04/2014 0622       Component Value Date/Time   WBC 2.3* 10/07/2014 1355   WBC 2.4* 08/04/2014 0622   WBC 2.4* 08/03/2014 0641   HGB 10.5* 10/07/2014 1355   HGB 9.7* 08/04/2014 0622   HGB 9.4* 08/03/2014 0641   HCT 30.7* 10/07/2014 1355   HCT 28.8* 08/04/2014 0622   HCT 28.2* 08/03/2014 0641   MCV 94.5 10/07/2014 1355   MCV 96.3 08/04/2014 0622   MCV 95.6 08/03/2014 0641    Lipid Panel     Component Value Date/Time   CHOL 179 06/19/2013 1241   TRIG 66 06/19/2013 1241   HDL 51 06/19/2013 1241   CHOLHDL 3.5 06/19/2013 1241   VLDL 13 06/19/2013 1241   LDLCALC 115* 06/19/2013 1241    ABG No results found for: PHART, PCO2ART, PO2ART, HCO3, TCO2, ACIDBASEDEF, O2SAT   Lab Results  Component Value Date   TSH 1.805 04/25/2013   BNP (last 3 results) No results for input(s): BNP in the last 8760 hours.  ProBNP (last 3 results) No results for input(s): PROBNP in the last 8760 hours.  Cardiac Panel (last 3 results) No results for input(s): CKTOTAL, CKMB, TROPONINI,  RELINDX in the last 72 hours.  Iron/TIBC/Ferritin/ %Sat    Component Value Date/Time   IRON 57 08/01/2014 1534   TIBC 325 02/22/2011 1702   FERRITIN 119 08/01/2014 1534   IRONPCTSAT 6* 02/22/2011 1702     EKG Orders placed or performed in visit on 11/13/14  . EKG 12-Lead     Prior Assessment and Plan Problem List as of 12/30/2014      Cardiovascular  and Mediastinum   MITRAL REGURGITATION, MODERATE   Last Assessment & Plan 08/08/2014 Office Visit Written 08/08/2014  4:06 PM by Lendon Colonel, NP    No apparent for repair or surgery at this time. Multiple comorbidities. Continue medical management.      Essential hypertension   Last Assessment & Plan 11/13/2014 Office Visit Written 11/13/2014  3:21 PM by Imogene Burn, PA-C    Blood Pressure stable      Chronic systolic heart failure   Last Assessment & Plan 11/13/2014 Office Visit Written 11/13/2014  3:20 PM by Imogene Burn, PA-C    Patient has recurrent heart failure. He doesn't like taking Lasix because it keeps him in the bathroom most of the day. Recommend Lasix 20 mg once daily for 3 days and then changed to HCTZ 25 mg once daily. I had along discussion concerning a 2 g sodium diet. Follow-up 2-D echo for LV function. Check lab next week. Follow-up with Dr. Lovena Le in 2 weeks.      Atrial fibrillation, persistent   Last Assessment & Plan 11/13/2014 Office Visit Written 11/13/2014  3:22 PM by Imogene Burn, PA-C    Patient is no longer on amiodarone and her rate lowering medications        Digestive   GERD (gastroesophageal reflux disease)   Last Assessment & Plan 09/03/2013 Office Visit Written 09/09/2013  2:31 PM by Fayrene Helper, MD    Controlled, no change in medication Followed by GI      Dysphagia, unspecified(787.20)   Last Assessment & Plan 03/19/2014 Office Visit Written 03/21/2014 11:36 PM by Orvil Feil, NP    81 year old male with new-onset dysphagia. Vague historian but wife does note patient  complains of hoarseness and notes his voice has changed. Father with history of throat cancer. Will proceed with upper endoscopy to rule out occult process; recommend subsequent ENT evaluation of negative EGD.   Proceed with upper endoscopy and dilation in the near future with Dr. [name]. The risks, benefits, and alternatives have been discussed in detail with patient. They have stated understanding and desire to proceed.  ENT referral if negative EGD      Acute upper GI bleeding     Nervous and Auditory   CENTRAL HEARING LOSS   Last Assessment & Plan 12/28/2013 Office Visit Written 12/29/2013  9:11 AM by Fayrene Helper, MD    needs hearing aids, will advise checking belltone  For help with this      Alzheimer's dementia   Last Assessment & Plan 10/07/2014 Office Visit Written 10/07/2014  1:30 PM by [name], MD    Today, he has minimal symptoms and is not agitated. Will follow.      Lower extremity weakness   Last Assessment & Plan 11/09/2013 Office Visit Written 11/10/2013  3:15 PM by Fayrene Helper, MD    Improved following recent physical therapy sessions, also ahs not been using pain medication regularly      Bilateral leg weakness   Last Assessment & Plan 12/28/2013 Office Visit Written 12/29/2013  9:09 AM by Fayrene Helper, MD    Continued c/o lower extremity weakness, no falls, no interest in therapy currently        Musculoskeletal and Integument   GEN OSTEOARTHROSIS INVOLVING MULTIPLE SITES   Last Assessment & Plan 05/20/2014 Office Visit Written 05/26/2014  5:43 PM by Fayrene Helper, MD    Ongoing chronic pain in multiple joints with limitation in  mobility Pt to use fenatnyl q 3 days 75mg dose , discouraged from continuing tramadol along with this      Abrasion of hand, left   Last Assessment & Plan 08/01/2014 Office Visit Written 08/01/2014 10:19 PM by MFayrene Helper MD    H/o hand trauma with slow healing and persistent scab, antibiotic ointment given  for twice daily use fo next 5 to 7 days, if persists will need dermatology eval, had multiple myeloma on his foot which presented in a similar fashion        Genitourinary   INTERSTITIAL CYSTITIS   Last Assessment & Plan 11/09/2013 Office Visit Written 11/10/2013  3:17 PM by MFayrene Helper MD    C/o nocturia which disturbs his sleep, no improvement with recent procedure reportedly. I have advised him to try half valium at bedtime, urology had prescribed 3 times daily, he has not been taking this, and I suspect he would be unable to tolerate that high a dose. UA in the office is negative for infection. F/U with urology is to be arranged        Hematopoietic and Hemostatic   Pancytopenia   Last Assessment & Plan 03/01/2013 Office Visit Written 03/03/2013  3:04 PM by MFayrene Helper MD    Improved numbers, followed by hematology        Other   ERECTILE DYSFUNCTION   Last Assessment & Plan 05/10/2011 Office Visit Written 05/10/2011 10:00 AM by MFayrene Helper MD    Pt states that the cialis is not working as well, he still has a lot, advised him to discuss further with his urologist      [place] Office Visit Written 09/09/2013  2:35 PM by MFayrene Helper MD    The importance of regular eye exam and compliance with treatment is stressed      Backache   Last Assessment & Plan 11/09/2013 Office Visit Written 11/10/2013  3:18 PM by MFayrene Helper MD    Improved with recent therapy. Will use tramadol 50 mg one daily, only if needed      [place] Office Visit Written 10/07/2014  1:30 PM by GEvans Lance MD    He has reached ERI on his current device. Will schedule for PPM generator change.      Malignant melanoma   Last Assessment & Plan 05/10/2011 Office Visit Written 05/12/2011  8:50 AM by MFayrene Helper MD    [facility]     Abnormal gait   Nocturia    Last Assessment & Plan 12/28/2013 Office Visit Written 12/29/2013  9:07 AM by MFayrene Helper MD    Anxiety, poor sleep and frequent urination at night, no infection in urine. Trial of amitriptyline low dose      Postural imbalance   Heme positive stool   Last Assessment & Plan 11/01/2013 Office Visit Written 11/01/2013  1:43 PM by LMahala Menghini PA-C    81year old gentleman with history of Hemoccult-positive stool, pancytopenia. Was not able to undergo colonoscopy because he did not refrain from eating solid food the day before his procedure. His constipation has resolved. His appetite has improved. He has gained 3 pounds. At this time he is not interested in pursuing a colonoscopy or upper endoscopy. We discussed that his Hemoccult-positive stool may be completely benign finding from benign anorectal disease but  cannot exclude possibility of advanced polyp or malignancy. He voiced understanding that delay in workup may negatively affect his outcome. He is adamant about avoiding endoscopic evaluation. I note that he is most recent hemoglobin in November was very similar to that over the past several years.   Repeat CBC in April. Further recommendations at that point.      Abnormal weight loss   Anorexia nervosa   Chest pain   Last Assessment & Plan 02/22/2014 Office Visit Written 02/22/2014 11:36 AM by [name], MD    The etiology is unclear. It has resolved. As he has not been thought to have CAD in the past, I am not inclined to work up again. We will give him ntg.      Need for vaccination with 13-polyvalent pneumococcal conjugate vaccine   Last Assessment & Plan 05/20/2014 Office Visit Written 05/26/2014  5:44 PM by Fayrene Helper, MD    Vaccine administered      Recurrent falls   Last Assessment & Plan 11/13/2014 Office Visit Written 11/13/2014  3:22 PM by Imogene Burn, PA-C    Patient had recent fall after missing a step coming out of her first run. He has a fair amount  of bruising around his left eye. CT scan showed no acute abnormality.      Anemia due to other cause   Last Assessment & Plan 08/01/2014 Office Visit Written 08/01/2014 10:16 PM by Fayrene Helper, MD    Pt to return in am for testing for heme positive stool as e is on xarelto, need to ensure bleed is not internal as well as the obvious bruise from recurrent trauma If heme neg, will transfuse as out pt ,  I called and explained to his spouse to bring him to office for further management, and arrangements have been started for outpatient  transfusion      Medicare annual wellness visit, subsequent   Last Assessment & Plan 10/17/2014 Office Visit Written 10/17/2014 11:43 AM by Fayrene Helper, MD    Annual exam as documented. Counseling done  re healthy lifestyle involving commitment to 150 minutes exercise per week, heart healthy diet, and attaining healthy weight.The importance of adequate sleep also discussed. Regular seat belt use and home safety, is also discussed. Changes in health habits are decided on by the patient with goals and time frames  set for achieving them. Immunization and cancer screening needs are specifically addressed at this visit.           Imaging: No results found.

## 2014-12-30 NOTE — Telephone Encounter (Signed)
Patient and wife in office today, both agree to allow [name] , RN,  [facility] Coordinator to visit them. Contact wife Raneda,mornings are best. I spoke with cheryl and she will call patient tomorrow

## 2014-12-31 LAB — BASIC METABOLIC PANEL
BUN: 31 mg/dL — ABNORMAL HIGH (ref 6–23)
CALCIUM: 8.4 mg/dL (ref 8.4–10.5)
CHLORIDE: 107 meq/L (ref 96–112)
CO2: 28 meq/L (ref 19–32)
Creat: 0.99 mg/dL (ref 0.50–1.35)
GLUCOSE: 96 mg/dL (ref 70–99)
Potassium: 4 mEq/L (ref 3.5–5.3)
Sodium: 142 mEq/L (ref 135–145)

## 2014-12-31 LAB — CBC
HCT: 31 % — ABNORMAL LOW (ref 39.0–52.0)
HEMOGLOBIN: 10.2 g/dL — AB (ref 13.0–17.0)
MCH: 31.8 pg (ref 26.0–34.0)
MCHC: 32.9 g/dL (ref 30.0–36.0)
MCV: 96.6 fL (ref 78.0–100.0)
MPV: 10.9 fL (ref 8.6–12.4)
PLATELETS: 97 10*3/uL — AB (ref 150–400)
RBC: 3.21 MIL/uL — ABNORMAL LOW (ref 4.22–5.81)
RDW: 15.2 % (ref 11.5–15.5)
WBC: 3.3 10*3/uL — AB (ref 4.0–10.5)

## 2014-12-31 MED ORDER — AZITHROMYCIN 500 MG PO TABS: 500.0000 mg | ORAL_TABLET | Freq: Every day | ORAL | Status: DC

## 2014-12-31 NOTE — Telephone Encounter (Signed)
I spoke with wife, she will pick up antibiotic today and encourage her husband to take lasix 20 mg daily.CXR forwarded to Dr Moshe Cipro

## 2014-12-31 NOTE — Telephone Encounter (Signed)
-----   Message from Lendon Colonel, NP sent at 12/31/2014  7:06 AM EDT ----- He has mild CHF and pneumonitis. He will need to take take the HCTZ I asked him to take this am. He refuses lasix, but would like to try him on daily lasix 20 mg (not BID as he was taking before). Maybe he will tolerate the daily dose. His main complaint was frequent urination.   He has pneumonitis as well.begin azithromycin 500 mg daily for 3 days.. Have him follow up with Dr. Moshe Cipro for further treatment.   Please cc copy of CXR and labs to Dr.Simpson Thanks

## 2014-12-31 NOTE — Telephone Encounter (Signed)
noted 

## 2015-01-06 ENCOUNTER — Emergency Department (HOSPITAL_COMMUNITY)
Admission: EM | Admit: 2015-01-06 | Discharge: 2015-01-06 | Disposition: A | Discharge status: Home / Self Care | Payer: Medicare Other | Attending: Emergency Medicine | Admitting: Emergency Medicine

## 2015-01-06 ENCOUNTER — Encounter: Payer: Self-pay | Admitting: Family Medicine

## 2015-01-06 ENCOUNTER — Encounter (HOSPITAL_COMMUNITY): Payer: Self-pay | Admitting: Emergency Medicine

## 2015-01-06 ENCOUNTER — Emergency Department (HOSPITAL_COMMUNITY): Payer: Medicare Other

## 2015-01-06 ENCOUNTER — Other Ambulatory Visit: Payer: Self-pay

## 2015-01-06 ENCOUNTER — Ambulatory Visit (INDEPENDENT_AMBULATORY_CARE_PROVIDER_SITE_OTHER): Payer: Medicare Other | Admitting: Family Medicine

## 2015-01-06 VITALS — BP 146/78 | HR 78 | Resp 18 | Ht 71.0 in | Wt 162.0 lb

## 2015-01-06 DIAGNOSIS — I509 Heart failure, unspecified: Secondary | ICD-10-CM

## 2015-01-06 DIAGNOSIS — I10 Essential (primary) hypertension: Secondary | ICD-10-CM | POA: Insufficient documentation

## 2015-01-06 DIAGNOSIS — R0600 Dyspnea, unspecified: Secondary | ICD-10-CM

## 2015-01-06 DIAGNOSIS — H409 Unspecified glaucoma: Secondary | ICD-10-CM | POA: Diagnosis not present

## 2015-01-06 DIAGNOSIS — F0390 Unspecified dementia without behavioral disturbance: Secondary | ICD-10-CM | POA: Diagnosis not present

## 2015-01-06 DIAGNOSIS — I5022 Chronic systolic (congestive) heart failure: Secondary | ICD-10-CM | POA: Insufficient documentation

## 2015-01-06 DIAGNOSIS — Z85820 Personal history of malignant melanoma of skin: Secondary | ICD-10-CM | POA: Diagnosis not present

## 2015-01-06 DIAGNOSIS — Z950 Presence of cardiac pacemaker: Secondary | ICD-10-CM | POA: Insufficient documentation

## 2015-01-06 DIAGNOSIS — Z8719 Personal history of other diseases of the digestive system: Secondary | ICD-10-CM | POA: Insufficient documentation

## 2015-01-06 DIAGNOSIS — R296 Repeated falls: Secondary | ICD-10-CM | POA: Diagnosis not present

## 2015-01-06 DIAGNOSIS — Z87448 Personal history of other diseases of urinary system: Secondary | ICD-10-CM | POA: Diagnosis not present

## 2015-01-06 DIAGNOSIS — G8929 Other chronic pain: Secondary | ICD-10-CM | POA: Insufficient documentation

## 2015-01-06 DIAGNOSIS — R0602 Shortness of breath: Secondary | ICD-10-CM | POA: Insufficient documentation

## 2015-01-06 DIAGNOSIS — G309 Alzheimer's disease, unspecified: Secondary | ICD-10-CM

## 2015-01-06 DIAGNOSIS — F0280 Dementia in other diseases classified elsewhere without behavioral disturbance: Secondary | ICD-10-CM

## 2015-01-06 LAB — COMPREHENSIVE METABOLIC PANEL
ALT: 26 U/L (ref 0–53)
ANION GAP: 5 (ref 5–15)
AST: 36 U/L (ref 0–37)
Albumin: 3.3 g/dL — ABNORMAL LOW (ref 3.5–5.2)
Alkaline Phosphatase: 84 U/L (ref 39–117)
BUN: 20 mg/dL (ref 6–23)
CALCIUM: 8.4 mg/dL (ref 8.4–10.5)
CO2: 26 mmol/L (ref 19–32)
CREATININE: 0.96 mg/dL (ref 0.50–1.35)
Chloride: 109 mmol/L (ref 96–112)
GFR calc Af Amer: 85 mL/min — ABNORMAL LOW (ref >90)
GFR, EST NON AFRICAN AMERICAN: 73 mL/min — AB (ref >90)
GLUCOSE: 108 mg/dL — AB (ref 70–99)
Potassium: 3.9 mmol/L (ref 3.5–5.1)
Sodium: 140 mmol/L (ref 135–145)
TOTAL PROTEIN: 6.7 g/dL (ref 6.0–8.3)
Total Bilirubin: 1.5 mg/dL — ABNORMAL HIGH (ref 0.3–1.2)

## 2015-01-06 LAB — CBC WITH DIFFERENTIAL/PLATELET
Basophils Absolute: 0 10*3/uL (ref 0.0–0.1)
Basophils Relative: 0 % (ref 0–1)
EOS ABS: 0.1 10*3/uL (ref 0.0–0.7)
EOS PCT: 3 % (ref 0–5)
HCT: 31.1 % — ABNORMAL LOW (ref 39.0–52.0)
Hemoglobin: 10.4 g/dL — ABNORMAL LOW (ref 13.0–17.0)
LYMPHS PCT: 13 % (ref 12–46)
Lymphs Abs: 0.5 10*3/uL — ABNORMAL LOW (ref 0.7–4.0)
MCH: 32.6 pg (ref 26.0–34.0)
MCHC: 33.4 g/dL (ref 30.0–36.0)
MCV: 97.5 fL (ref 78.0–100.0)
MONO ABS: 0.2 10*3/uL (ref 0.1–1.0)
MONOS PCT: 6 % (ref 3–12)
NEUTROS ABS: 2.7 10*3/uL (ref 1.7–7.7)
Neutrophils Relative %: 78 % — ABNORMAL HIGH (ref 43–77)
Platelets: 143 10*3/uL — ABNORMAL LOW (ref 150–400)
RBC: 3.19 MIL/uL — ABNORMAL LOW (ref 4.22–5.81)
RDW: 14.8 % (ref 11.5–15.5)
WBC: 3.4 10*3/uL — AB (ref 4.0–10.5)

## 2015-01-06 LAB — TROPONIN I: Troponin I: 0.56 ng/mL (ref <0.031)

## 2015-01-06 LAB — BRAIN NATRIURETIC PEPTIDE: B NATRIURETIC PEPTIDE 5: 664 pg/mL — AB (ref 0.0–100.0)

## 2015-01-06 NOTE — Patient Outreach (Signed)
[facility]) Care Management  01/06/2015  [name] 05-Nov-1925 [id number]  Referral from MD office.  Reason for referral is non compliance with medications/ medication management. Patient and his wife is agreeable to services of [facility] and wishes to schedule an appointment.  Patient needs assistance with self-managing his chronic diseases of heart failure and hypertension.  Patient gets confused about how to take his medications and many times forgets to take his medications.  Patient has Alzheimer's dementia and wife states it is progressing.  Wife concerned about patient driving his car and has taken his car keys, however this has not been a deterrent to keep patient from driving.  Wife would like social work to be involved with finding resources in community  for Alzheimer's support. RN CM will make a referral for [facility] community nurse and social work.  [name], RN, [place], [place] Telephonic Care Coordinator [phone]  .

## 2015-01-06 NOTE — ED Notes (Signed)
CRITICAL VALUE ALERT  Critical value received:  Troponin 0.56  Date of notification:  01/06/15  Time of notification:  2105  Critical value read back:  yes  Nurse who received alert:  Myer Peer, RN  MD notified:  Dr. Rogene Houston  Time:  2106

## 2015-01-06 NOTE — Discharge Instructions (Signed)
Workup for the shortness of breath without any significant abnormal findings.

## 2015-01-06 NOTE — ED Provider Notes (Signed)
CSN: [id number]     Arrival date & time 01/06/15  1713 History  This chart was scribed for Fredia Sorrow, MD by Eustaquio Maize, ED Scribe. This patient was seen in room APA12/APA12 and the patient's care was started at 10:00 PM.    Chief Complaint  Patient presents with  . Shortness of Breath   LEVEL 5 CAVEAT  Patient is a 81 y.o. male presenting with shortness of breath. The history is provided by the patient. No language interpreter was used.  Shortness of Breath Severity:  Unable to specify Onset quality:  Unable to specify Timing:  Unable to specify  HPI Comments: [name] is a 81 y.o. male with PMHx Alzheimer's, Atrial fibrillation, HTN, Nonischemic cardiomyopathy who presents to the Emergency Department complaining of shortness of breath that began several weeks ago. Wife also mentions that pt has been having lower extremity weakness. Wife reports that pt fell twice this week due to the weakness.  Pt saw Cardiologist on Monday 3/28 and was diagnosed with pneumonia. Pt was prescribed antibiotics for 3 days. He took the medicine for 2 days and told wife that he took the 3rd pill, but she states that she found the pill a couple of days later. O2 sat on RA is 96% currently. Pt states he is not currently having any shortness of breath.  Past Medical History  Diagnosis Date  . Chronic systolic heart failure   . Rash and other nonspecific skin eruption   . Costochondritis, acute   . Glaucoma   . Erectile dysfunction   . Visual changes   . Pancytopenia   . Chronic pancreatitis     Based on CT findings  . History of melanoma in situ     JAN 2012--  LEFT HEEL W/ SLN BX  . Chronic back pain   . IC (interstitial cystitis)   . BPH (benign prostatic hypertrophy)   . Cardiac pacemaker     CARDIOLOGIST-- DR Cristopher Peru (LAST [facility] 05-15-2013)  . Asymptomatic carotid artery stenosis     BILATERAL MILD ICA---  <50% PER DUPLEX 03-08-2008  . History of syncope   . Hypertension   .  CHB (complete heart block)   . Nonischemic cardiomyopathy   . Severe mitral regurgitation   . Urge urinary incontinence   . A-fib   . Dementia    Past Surgical History  Procedure Laterality Date  . Cataract extraction, bilateral  left  1997/   right 2003  . Pacemaker insertion  12-06-2008  DR GREGG TAYLOR    BiV PPM  --  [employer]  . Colonoscopy  08/18/2006    HLK:TGYB granularity and erosions of the rectum of uncertain significance biopsied.  A long redundant, but otherwise normal-appearing colon.melanosi coli and minimal inflammation on bx  . Inguinal hernia repair Bilateral AS TEEN  . Interstim implant placement  2001  &  2007  . Cysto/ hod/  replacement interstim implant  05-14-2003  . Removal interstim implant/  cyst/  hod  04-14-2004  . Revision interstim implant and replace generator  05-15-2009    left upper buttock for urge urinary incontinence  . Wide excision left heel and sln bx  JUNE 2012  . Tonsillectomy  1950  . Left elbow surgery  2002  . Back surgery  X3  LAST ONE'90's  . Transthoracic echocardiogram  12-06-2008    LVSF 55-60%/  MODERATE MR/  MILD AR/  QUESTION DISTAL POSTERIOR HYPOKINESIS  . Interstim implant revision N/A 08/28/2013  Procedure: REPLACEMENT OF INTERSTIM-GENERATOR AND LEAD ;  Surgeon: [name], MD;  Location: [facility];  Service: Urology;  Laterality: N/A;  . Esophagogastroduodenoscopy N/A 03/28/2014    Dr. [name]: normal esophagus s/p dilation, mosaic appearance - chronic inflammation noted on bx  . Savory dilation N/A 03/28/2014    Procedure: SAVORY DILATION;  Surgeon: [name], MD;  [facility];  Service: Endoscopy;  Laterality: N/A;  Venia Minks dilation N/A 03/28/2014    Procedure: Venia Minks DILATION;  Surgeon: [name], MD;  [facility];  Service: Endoscopy;  Laterality: N/A;  . Colonoscopy N/A 08/05/2014    Procedure: COLONOSCOPY;  Surgeon: [name], MD;  [facility];   Service: Endoscopy;  Laterality: N/A;  PT NEEDS TCS AT 1000.  . Implantable cardioverter defibrillator (icd) generator change N/A 10/11/2014    Procedure: ICD GENERATOR CHANGE;  Surgeon: [name], MD;  Location: [facility] CATH LAB;  Service: Cardiovascular;  Laterality: N/A;   Family History  Problem Relation Age of Onset  . Dementia Mother   . Throat cancer Father   . Cancer Father 86    throat  . Lung cancer Sister   . Heart disease Brother    History  Substance Use Topics  . Smoking status: Former Smoker -- 0.02 packs/day    Types: Cigarettes    Quit date: 08/24/1997  . Smokeless tobacco: Never Used  . Alcohol Use: No    Review of Systems  Unable to perform ROS: Dementia  Respiratory: Positive for shortness of breath.       Allergies  Codeine and Penicillins  Home Medications   Prior to Admission medications   Medication Sig Start Date End Date Taking? Authorizing Provider  amLODipine (NORVASC) 2.5 MG tablet TAKE 1 TABLET (2.5 MG TOTAL) BY MOUTH DAILY. 07/05/14  Yes Fayrene Helper, MD  aspirin EC 81 MG tablet Take 162 mg by mouth daily as needed for mild pain or fever.   Yes Historical Provider, MD  dorzolamide-timolol (COSOPT) 22.3-6.8 MG/ML ophthalmic solution Place 1 drop into both eyes 2 (two) times daily.  10/13/10  Yes Historical Provider, MD  fentaNYL (DURAGESIC - DOSED MCG/HR) 25 MCG/HR patch Place 25 mcg onto the skin every 3 (three) days.   Yes Historical Provider, MD  ibuprofen (ADVIL,MOTRIN) 200 MG tablet Take 200 mg by mouth every 6 (six) hours as needed for moderate pain.    Yes Historical Provider, MD  traMADol (ULTRAM) 50 MG tablet Take 50 mg by mouth daily as needed for moderate pain.  09/09/14  Yes Fayrene Helper, MD  furosemide (LASIX) 20 MG tablet Take 1 tablet (20 mg total) by mouth daily. 11/13/14   Imogene Burn, PA-C  hydrochlorothiazide (HYDRODIURIL) 25 MG tablet Take 1 tablet (25 mg total) by mouth daily. 11/13/14   Imogene Burn, PA-C   latanoprost (XALATAN) 0.005 % ophthalmic solution Place 1 drop into both eyes at bedtime.  12/16/10   Historical Provider, MD  Meth-Hyo-M Barnett Hatter Phos-Ph Sal (URIBEL) 118 MG CAPS Take 118 mg by mouth 3 (three) times daily as needed (bladder spasms).     Historical Provider, MD  traZODone (DESYREL) 50 MG tablet Take 25 mg by mouth at bedtime.    Historical Provider, MD   Triage Vitals: BP 129/90 mmHg  Pulse 62  Temp(Src) 98.5 F (36.9 C) (Oral)  Resp 24  Ht 5\' 11"  (1.803 m)  Wt 162 lb (73.483 kg)  BMI  22.60 kg/m2  SpO2 93%   Physical Exam  Constitutional: He is oriented to person, place, and time. He appears well-developed and well-nourished. No distress.  HENT:  Head: Normocephalic and atraumatic.  Mouth/Throat: Mucous membranes are normal.  Eyes: Conjunctivae and EOM are normal. Pupils are equal, round, and reactive to light.  Sclera clear. Eye track normal.   Neck: Neck supple. No tracheal deviation present.  Cardiovascular: Normal rate, regular rhythm and normal heart sounds.   No murmur heard. Pulmonary/Chest: Effort normal and breath sounds normal. No respiratory distress.  Abdominal: Soft. Bowel sounds are normal. There is no tenderness.  Musculoskeletal: Normal range of motion. He exhibits no edema.  Neurological: He is alert and oriented to person, place, and time. No cranial nerve deficit. He exhibits normal muscle tone. Coordination normal.  Skin: Skin is warm and dry.  Psychiatric: He has a normal mood and affect. His behavior is normal.  Nursing note and vitals reviewed.   ED Course  Procedures (including critical care time)  DIAGNOSTIC STUDIES: Oxygen Saturation is 98% on RA, normal by my interpretation.    COORDINATION OF CARE: 10:08 PM-Discussed treatment plan which includes CBC, CMP, Troponin, BNP with pt at bedside and pt agreed to plan.   Labs Review Labs Reviewed  CBC WITH DIFFERENTIAL/PLATELET - Abnormal; Notable for the following:    WBC 3.4 (*)     RBC 3.19 (*)    Hemoglobin 10.4 (*)    HCT 31.1 (*)    Platelets 143 (*)    Neutrophils Relative % 78 (*)    Lymphs Abs 0.5 (*)    All other components within normal limits  COMPREHENSIVE METABOLIC PANEL - Abnormal; Notable for the following:    Glucose, Bld 108 (*)    Albumin 3.3 (*)    Total Bilirubin 1.5 (*)    GFR calc non Af Amer 73 (*)    GFR calc Af Amer 85 (*)    All other components within normal limits  TROPONIN I - Abnormal; Notable for the following:    Troponin I 0.56 (*)    All other components within normal limits  BRAIN NATRIURETIC PEPTIDE - Abnormal; Notable for the following:    B Natriuretic Peptide 664.0 (*)    All other components within normal limits    Imaging Review Dg Chest 2 View  01/06/2015   CLINICAL DATA:  Shortness of breath with exertion.  EXAM: CHEST  2 VIEW  COMPARISON:  12/30/2014  FINDINGS: The heart is mildly enlarged but stable. The pacer wires are stable. Stable tortuosity, ectasia and calcification of the thoracic aorta. The lungs are much better aerated with interval resolution of pulmonary edema. Mild central vascular congestion but no infiltrates or effusions. The bony structures are intact. Stable T11 compression fracture.  IMPRESSION: Resolution of pulmonary edema. Probable mild persistent vascular congestion.  No effusions or infiltrates.   Electronically Signed   By: Marijo Sanes M.D.   On: 01/06/2015 18:16     EKG Interpretation None      MDM   Final diagnoses:  None   Patient with complaint of shortness of breath no chest pain. Patient without any evidence of pulmonary edema pneumonia. Oxygen saturations are all 93% or greater. Patient labs without significant abnormalities other than a troponin which has been chronically elevated. Patient stable for discharge home. No sniffing change in labs otherwise.  I personally performed the services described in this documentation, which was scribed in my presence. The recorded information  has been reviewed and is accurate.       Fredia Sorrow, MD 01/06/15 2218

## 2015-01-06 NOTE — ED Notes (Signed)
PT c/o SOB on exertion and recent pneumonia. PT denies any chest pain or swelling to his lower extremities.

## 2015-01-06 NOTE — Patient Instructions (Addendum)
You need to go to the ED for evaluation today since you have fallen twice and you are experiencing marked increase in shortness of breath  F/u in 1 to 2 weeks   STOP amitriptyline, since you report night mares and likely not helping you much with sleep or urination. I have kept the tablets you had since last Summer

## 2015-01-07 ENCOUNTER — Other Ambulatory Visit: Payer: Self-pay | Admitting: *Deleted

## 2015-01-07 NOTE — Patient Outreach (Signed)
[facility]) Care Management  01/07/2015  Jermar Colter 12-20-1925 [id number]   Received notification from [name], RN to re-open case as patient's doctor called to state patient will now accept [place] Management services.  Case is now re-open.  [name]. [place] CM Assistant Phone: [phone] Fax: [phone]

## 2015-01-07 NOTE — Patient Outreach (Signed)
Phone call to patient's spouse, Guy Franco.  Home visit scheduled for 01/14/15 at 10:00am to complete social work assessment and to provide patient and his spouse with community resources related to  Alzheimer's and adult day programs.      [name] [facility] Care Management [phone]

## 2015-01-14 ENCOUNTER — Other Ambulatory Visit: Payer: Self-pay | Admitting: *Deleted

## 2015-01-14 ENCOUNTER — Encounter: Payer: Self-pay | Admitting: *Deleted

## 2015-01-14 VITALS — BP 140/85 | HR 66 | Resp 18 | Wt 158.0 lb

## 2015-01-14 DIAGNOSIS — F0280 Dementia in other diseases classified elsewhere without behavioral disturbance: Secondary | ICD-10-CM

## 2015-01-14 DIAGNOSIS — I5022 Chronic systolic (congestive) heart failure: Secondary | ICD-10-CM

## 2015-01-14 DIAGNOSIS — G309 Alzheimer's disease, unspecified: Secondary | ICD-10-CM

## 2015-01-14 NOTE — Patient Outreach (Signed)
[facility]) Care Management  [facility] Social Work  01/14/2015  Edwyn Inclan 12/25/1925 [id number]    Current Medications:  Current Outpatient Prescriptions  Medication Sig Dispense Refill  . amLODipine (NORVASC) 2.5 MG tablet TAKE 1 TABLET (2.5 MG TOTAL) BY MOUTH DAILY. 90 tablet 1  . aspirin EC 81 MG tablet Take 162 mg by mouth daily as needed for mild pain or fever.    . dorzolamide-timolol (COSOPT) 22.3-6.8 MG/ML ophthalmic solution Place 1 drop into both eyes 2 (two) times daily.     . fentaNYL (DURAGESIC - DOSED MCG/HR) 25 MCG/HR patch Place 25 mcg onto the skin every 3 (three) days.    . furosemide (LASIX) 20 MG tablet Take 1 tablet (20 mg total) by mouth daily. 3 tablet 0  . hydrochlorothiazide (HYDRODIURIL) 25 MG tablet Take 1 tablet (25 mg total) by mouth daily. 90 tablet 3  . ibuprofen (ADVIL,MOTRIN) 200 MG tablet Take 200 mg by mouth every 6 (six) hours as needed for moderate pain.     Marland Kitchen latanoprost (XALATAN) 0.005 % ophthalmic solution Place 1 drop into both eyes at bedtime.     . Meth-Hyo-M Bl-Na Phos-Ph Sal (URIBEL) 118 MG CAPS Take 118 mg by mouth 3 (three) times daily as needed (bladder spasms).     . traMADol (ULTRAM) 50 MG tablet Take 50 mg by mouth daily as needed for moderate pain.  30 tablet 0  . traZODone (DESYREL) 50 MG tablet Take 25 mg by mouth at bedtime.     No current facility-administered medications for this visit.    Functional Status:  In your present state of health, do you have any difficulty performing the following activities: 01/14/2015 01/06/2015  Hearing? - Y  Vision? - Y  Difficulty concentrating or making decisions? - Y  Walking or climbing stairs? - Y  Dressing or bathing? - Y  Doing errands, shopping? - N  Conservation officer, nature and eating ? N -  Using the Toilet? N -  In the past six months, have you accidently leaked urine? N -  Do you have problems with loss of bowel control? N -  Managing your Medications? N -  Managing your  Finances? N -  Housekeeping or managing your Housekeeping? N -    Fall/Depression Screening:  PHQ 2/9 Scores 01/14/2015 01/14/2015 04/25/2013  PHQ - 2 Score 0 1 0    Assessment:  Met with patient and his spouse for initial home visit.  RNCM was present at visit as well.  Patient's spouse is patient's main caregiver. Married for 18 years, both have 2 children each.  Spouses daughter described as being their main support when she is not working.  Patient's caregiver discussed feeling overwhelmed at times with patient's care.  Understands that as his condition progresses he may need facility care.  However until that time, she will do the best that she can.  Patient does not have Advanced Directive.  Both patient and spouse educated, Advanced Directive information given. Patient states that his Will needs to be updated to include his children.  They do have a family attorney that they can use .  Patient no longer drives, discussed  finding it difficult to depend on others to get where he is going. Emotional support provided.  Dementia support services provided to patient spouse, options regarding in home support and respite care discussed.   Plan:  This social worker to send patient and his spouse a list of personal care service options as well  as respite care options.  This social worker will follow up on respite care options.     [name] [facility] Care Management [phone]

## 2015-01-14 NOTE — Patient Outreach (Signed)
[facility]) Care Management   01/14/2015  [name] 27-Jul-1926 [id number]  [name] is an 81 y.o. male  Subjective: Pt stated he knew he took medications for high blood pressure but was unable to identify medications and purposes by their names. Pt stated he sometimes does not take the little white pills because they cause his bladder to act up. Pt stated he has not allowed his wife to handle his medications. Spouse stated he has refused to allow her to take over dispensing his medications up until this point. Pt stated he did not realize some of his prescribed medications were not refilled. He stated he thought the empty eye drop bottle was full and had been using it anyway. Pt stated his appetite had been down. Spouse stated she had off and on been buying him ensure but not on a regular basis. Pt repeated himself often and was noted to be troubled by the recent death of his younger sister mentioning this several times during the visit. Spouse stated she and the patient fell just last week and that she was getting really tired being the pt's caregiver without any help.  Objective:  Blood pressure 140/85, pulse 66, resp. rate 18, weight 158 lb (71.668 kg), SpO2 96 %.  Review of Systems  Constitutional: Positive for weight loss and malaise/fatigue.  HENT: Positive for hearing loss.   Musculoskeletal: Positive for back pain and falls.  Psychiatric/Behavioral: Positive for memory loss.  All other systems reviewed and are negative.   Physical Exam  Vitals reviewed. Constitutional: He is oriented to person, place, and time.  Cardiovascular: Normal rate and regular rhythm.   Pulses:      Popliteal pulses are 2+ on the right side, and 2+ on the left side.  Pacemaker site noted to left upper chest.   Respiratory: Breath sounds normal.  GI: Soft. Bowel sounds are normal.  Musculoskeletal:       Right ankle: He exhibits swelling.       Left ankle: He exhibits swelling.    Feet:  Neurological: He is alert and oriented to person, place, and time.  Pt able to state name, DOB, and address but more focused on the past. Did not complete thoughts easily. Noted to be forgetful and to repeat himself often  Skin: Skin is warm, dry and intact.     Psychiatric: Cognition and memory are impaired. He exhibits abnormal recent memory.    Current Medications:   Current Outpatient Prescriptions  Medication Sig Dispense Refill  . amitriptyline (ELAVIL) 10 MG tablet Take 10 mg by mouth at bedtime.    Marland Kitchen amLODipine (NORVASC) 2.5 MG tablet TAKE 1 TABLET (2.5 MG TOTAL) BY MOUTH DAILY. 90 tablet 1  . dorzolamide-timolol (COSOPT) 22.3-6.8 MG/ML ophthalmic solution Place 1 drop into both eyes 2 (two) times daily.     . fentaNYL (DURAGESIC - DOSED MCG/HR) 25 MCG/HR patch Place 25 mcg onto the skin every 3 (three) days.    . furosemide (LASIX) 20 MG tablet Take 1 tablet (20 mg total) by mouth daily. 3 tablet 0  . hydrochlorothiazide (HYDRODIURIL) 25 MG tablet Take 1 tablet (25 mg total) by mouth daily. 90 tablet 3  . ibuprofen (ADVIL,MOTRIN) 200 MG tablet Take 200 mg by mouth every 6 (six) hours as needed for moderate pain.     Marland Kitchen latanoprost (XALATAN) 0.005 % ophthalmic solution Place 1 drop into both eyes at bedtime.     . Meth-Hyo-M Bl-Na Phos-Ph Sal (URIBEL) 118 MG  CAPS Take 118 mg by mouth 3 (three) times daily as needed (bladder spasms).     . traMADol (ULTRAM) 50 MG tablet Take 50 mg by mouth daily as needed for moderate pain.  30 tablet 0  . aspirin EC 81 MG tablet Take 162 mg by mouth daily as needed for mild pain or fever.    . traZODone (DESYREL) 50 MG tablet Take 25 mg by mouth at bedtime.     No current facility-administered medications for this visit.    Functional Status:   In your present state of health, do you have any difficulty performing the following activities: 01/14/2015 01/06/2015  Hearing? - Y  Vision? - Y  Difficulty concentrating or making decisions? - Y    Walking or climbing stairs? - Y  Dressing or bathing? - Y  Doing errands, shopping? - N  Conservation officer, nature and eating ? N -  Using the Toilet? N -  In the past six months, have you accidently leaked urine? N -  Do you have problems with loss of bowel control? N -  Managing your Medications? N -  Managing your Finances? N -  Housekeeping or managing your Housekeeping? N -    Fall/Depression Screening:    PHQ 2/9 Scores 01/14/2015 01/14/2015 04/25/2013  PHQ - 2 Score 0 1 0   [facility] CM Care Plan        Patient Outreach from 01/14/2015 in [place] Problem One  Knowledge deficit of heart failure and hypertension evidenced by lack of med adherence related to dementia/Alzheimer's   Care Plan for Problem One  Active   Interventions for Problem One Long Term Goal  Educated pt on the importance of taking all prescribed medications, educated pt on the importance of wife filling med box for accuracy and consistancy, assisted wife in filling new pill box, wrote pt's meds clearly for easy reference   [facility] Long Term Goal (31-90 days)  Pt will not be admitted to the hospital related to HF in the next 90 days   [facility] Long Term Goal Start Date  01/14/15   [facility] CM Short Term Goal #1 (0-30 days)  Pt will take all medications as prescribed for the next 30 days    [facility] CM Short Term Goal #1 Start Date  01/14/15   [facility] CM Short Term Goal #2 (0-30 days)  Pt will obtain and drink one nutritional supplement per day for the next 30days    [facility] CM Short Term Goal #2 Start Date  01/14/15     Assessment:  Alert, talkative but disheveled appearing elderly pt with apparent memory impairment. Patient and spouse historian. Physical assessment as noted above. Pt noted to be concerned that hypertension could affect his eyesight and agreed to wife managing medications to help keep blood pressure wnl. Pt noted to be slightly demanding to spouse. Spouse noted to be tired as evidenced by her admittance of  being tired and her slight frustration with patient's repeated requests.   Plan: Assisted spouse with pill organization for pt. Providing a pill organization box and a clearly written list of medications. Plan: Called pt's primary pharmacy to confirm and request refills on medications pt had forgotten to refill. Plan: RNCM to continue disease education with patient and spouse at next appointment. Plan: Collaborate with [place] about spouse possibly receiving assistance with caring for pt. Plan: Make primary care MD aware of [facility] involvement via designated preference in EPIC.  Plan: RNCM to follow up with pt in his home in one month 5/10.

## 2015-01-16 ENCOUNTER — Telehealth: Payer: Self-pay

## 2015-01-16 ENCOUNTER — Other Ambulatory Visit: Payer: Self-pay | Admitting: *Deleted

## 2015-01-16 NOTE — Patient Outreach (Signed)
Spoke with Loma Sousa the nurse for pt's primary care MD M. Simpson regarding medication discrepancies found during recent pt home visit. Made Courtney aware pt stated he was not taking all prescribed meds every day. Also made her aware pt did not like the current sleep medication trazadone related to it causing him to be too sleepy during the day. Reviewed med discrepancies with nurse. She stated  She would speak with Dr. Moshe Cipro about the information and return a phone call to me with any new information.   [name] RN, BSN  [facility] Care Management [phone])

## 2015-01-16 NOTE — Telephone Encounter (Signed)
Yes ok to take elavil not trazodone, pld remove / make the med list accurate then As far as daily weights for cHF I suggest she get the opinion of his cardiologist as far as that is concerned  Pls see if she can involve his child/ children in trying to address the home situation as I do believe there is definite need for that, wife is also very old and they are really not able to care for themselves independently anymore in my opinion, calling in help/ involvement of younger family members is needed

## 2015-01-17 ENCOUNTER — Other Ambulatory Visit: Payer: Self-pay | Admitting: *Deleted

## 2015-01-17 ENCOUNTER — Other Ambulatory Visit: Payer: Self-pay

## 2015-01-17 NOTE — Telephone Encounter (Signed)
Spoke with [place].  She states that she will get in contact with cardio about weights. She is aware that patient should take Elavil instead of Trazadone and that he should be taking ASA 81mg  for heart protection.   She states that as far as child evolvement in care that there is only one daughter that is close by and assisting patient's. This is the daughter of Ms. [name].  They are working on getting [facility] services for Ms. [name].     She will provide updates as available.

## 2015-01-17 NOTE — Patient Outreach (Signed)
Return phone call received from [place] related to questions about medications and the necessity of pt doing weights daily in his home. Also discussed the pt's home situation, and the fact the [facility] is in the process of assisting pt and spouse with in home care. Medications clarified as Elavil instead of Trazadone, and pt was supposed to be taking an 81mg  aspirin daily. Loma Sousa reported the questions about the daily weights needed to be routed to pt's cardiologist.   Plan: Call pt's wife and make her aware that the aspirin needed to be added daily. Plan: Call pt's cardiologist to inquire about the necessity of daily weights.  [name] RN, BSN  [facility] Care Management [phone])

## 2015-01-17 NOTE — Patient Outreach (Signed)
Attempted to contact pt about medicines and to schedule follow up visit. Pt did not answer and did not have an answering machine.  Plan: Call pt again on Monday.

## 2015-01-20 ENCOUNTER — Other Ambulatory Visit: Payer: Self-pay | Admitting: *Deleted

## 2015-01-20 NOTE — Patient Outreach (Signed)
Spoke with pt's wife Guy Franco, to inform her of pt's need to take a baby aspirin q day per the MD orders. Asked spouse if pt had ben allowing her to manage medications to which she replied "It's not going so well" Spouse stated pt felt the medications were causing his bladder to burn so he told her he was not taking this medication any more. Encouraged spouse to encourage pt to take medications as prescribed for blood pressure. Spouse stated she picked up medicine from the pharmacy but no eye drops were in the order. Pt noted to be out of the COSOPT eye drop while in the home and this medicine called in to the pharmacy while in pt home on 4/12. Scheduled home visit for 5/10 with spouse.  Plan: Pt outreach visit scheduled for 02/11/15  Drago Hammonds RN, BSN  [facility] Care Management [phone])

## 2015-01-23 ENCOUNTER — Encounter: Payer: Self-pay | Admitting: Family Medicine

## 2015-01-23 ENCOUNTER — Ambulatory Visit (INDEPENDENT_AMBULATORY_CARE_PROVIDER_SITE_OTHER): Payer: Medicare Other | Admitting: Family Medicine

## 2015-01-23 VITALS — BP 126/74 | HR 72 | Resp 18 | Ht 73.0 in | Wt 152.1 lb

## 2015-01-23 DIAGNOSIS — F0280 Dementia in other diseases classified elsewhere without behavioral disturbance: Secondary | ICD-10-CM

## 2015-01-23 DIAGNOSIS — R29898 Other symptoms and signs involving the musculoskeletal system: Secondary | ICD-10-CM

## 2015-01-23 DIAGNOSIS — I5022 Chronic systolic (congestive) heart failure: Secondary | ICD-10-CM

## 2015-01-23 DIAGNOSIS — G309 Alzheimer's disease, unspecified: Secondary | ICD-10-CM

## 2015-01-23 DIAGNOSIS — M5440 Lumbago with sciatica, unspecified side: Secondary | ICD-10-CM

## 2015-01-23 DIAGNOSIS — I481 Persistent atrial fibrillation: Secondary | ICD-10-CM | POA: Diagnosis not present

## 2015-01-23 DIAGNOSIS — I10 Essential (primary) hypertension: Secondary | ICD-10-CM | POA: Diagnosis not present

## 2015-01-23 DIAGNOSIS — I4819 Other persistent atrial fibrillation: Secondary | ICD-10-CM

## 2015-01-23 NOTE — Patient Instructions (Signed)
Annual physical exam in 3 month, call if you need me before    Thankful you are much better, our  Condolence on your recent loss.  Continue your medicine as you take it now  Dr Katy Fitch will see if he is able to help your vision  Thanks for choosing [facility], we consider it a privelige to serve you.

## 2015-01-23 NOTE — Progress Notes (Signed)
   Subjective:    Patient ID: [name], male    DOB: 1925/10/28, 81 y.o.   MRN: [id number]  HPI Pt in for Ed follow up. Last seen 2 weeks ago extremely short of breathn and weak, and he was sent directly to the ED. Was not kep in hospital, however, since then he has hadclose follow up with out pt social workers, and attemopts made to get his son more involved with his care as both himself and his wife are aging and less able to care for themselves. Also nursing has been involved in sorting out the medication he actually takes regularly at home, which is essential Has brought only 5 medications organized in terms of day and night time medication and looks 2005 better than the last viist  Review of Systems See HPI Denies recent fever or chills. Denies sinus pressure, nasal congestion, ear pain or sore throat. Denies chest congestion, productive cough or wheezing. Denies chest pains, palpitations and leg swelling, denies PND or orthopnea Denies abdominal pain, nausea, vomiting,diarrhea or constipation. Chronic frequency and nocturia C/o chronic  joint pain, swelling and limitation in mobility.No falls since last visit Denies headaches, seizures, numbness, or tingling. Denies depression, anxiety or insomnia. Denies skin break down or rash.        Objective:   Physical Exam BP 126/74 mmHg  Pulse 72  Resp 18  Ht 6\' 1"  (1.854 m)  Wt 152 lb 1.3 oz (68.983 kg)  BMI 20.07 kg/m2  SpO2 98%   Patient alert and oriented and in no cardiopulmonary distress.  HEENT: No facial asymmetry, EOMI,   oropharynx pink and moist.  Neck decreased ROM no JVD, no mass.  Chest: Clear to auscultation bilaterally.  CVS: S1, S2 systolic  murmur, no S3.Regular rate.  ABD: Soft non tender.   Ext: No edema  MS: Decreased  ROM spine, shoulders, hips and knees.  Skin: Intact, no ulcerations or rash noted.  Psych: Good eye contact, normal affect. Memory impaired not anxious or depressed  appearing.  CNS: CN 2-12 intact, power,  normal throughout.no focal deficits noted.        Assessment & Plan:  Essential hypertension Controlled, no change in medication    Chronic systolic heart failure Asymptomatic , no leg edema or JVD on exam, clinically stable   Atrial fibrillation, persistent Rate controlled, anticoaguallation contraindicated   Bilateral leg weakness Improved strength and stability at this time, encouraged daily home strengthening exercises   Backache Unchanged however pt and wife report using the fentanyl patch as needed   Alzheimer's dementia No agitation in recent times, but clear evidence of memory loss and confusion at home, would benefit from med which eh ahds been using in the past will reconsider re introduction of same

## 2015-01-24 NOTE — Telephone Encounter (Signed)
Seen in office taking elavil; and doing well, no trazodone needed

## 2015-01-27 ENCOUNTER — Ambulatory Visit (INDEPENDENT_AMBULATORY_CARE_PROVIDER_SITE_OTHER): Payer: Medicare Other | Admitting: Adult Health

## 2015-01-27 ENCOUNTER — Encounter: Payer: Self-pay | Admitting: Adult Health

## 2015-01-27 ENCOUNTER — Other Ambulatory Visit: Payer: Self-pay | Admitting: *Deleted

## 2015-01-27 VITALS — BP 116/82 | HR 76 | Ht 71.0 in | Wt 150.0 lb

## 2015-01-27 DIAGNOSIS — I10 Essential (primary) hypertension: Secondary | ICD-10-CM | POA: Diagnosis not present

## 2015-01-27 NOTE — Progress Notes (Signed)
Cardiology Office Note   Date:  01/27/2015   ID:  [name], DOB [birth year], MRN [id number]  PCP:  [name], MD  Cardiologist: Bryna Colander, NP   Chief Complaint  Patient presents with  . Congestive Heart Failure  . Cardiomyopathy  . Hypertension      History of Present Illness: [name] is a 81 y.o. male who presents for is an assessment and management of chronic systolic CHF, nonischemic cardiomyopathy, hypertension, and pacemaker placed by Dr. Lovena Le in January 2016.the patient was last seen in the office on 12/30/2014, he did not been medically compliant concerning Lasix.  Her HCTZ to do to frequent urination.  He was advised to take HCTZ 12.5 mg daily, to assist with blood pressure and breathing status.  His chest pain was chronic for him.  He has refused, outside assistance with[facility].  However, he is willing to talk to them if they come by.  Followup CBC and BMET were completed, sodium 140, potassium 3.9, chloride 109, CO2 26, glucose 108, BUN 20, creatinine 0.9.  6.  BNP 664.  Chest x-ray revealed low-grade CHF, possible pneumonitis.  He was placed on azithromycin 500 mg daily for 3 days and to followup with his primary care physician, Dr. Moshe Cipro.  Since being seen last she continues to be confused about his medications.  I have taken him off of Lasix on the last office visit, but he continued to take it.  He is complaining about frequent urination. [facility] did come out to see him and evaluate him.  He does not remember what they told him.  They do plan to return to see him in a week.  In the interim, I have reviewed all his medications again.  He denies any chest pain, palpitations, dizziness, or near syncope.    Past Medical History  Diagnosis Date  . Chronic systolic heart failure   . Rash and other nonspecific skin eruption   . Costochondritis, acute   . Glaucoma   . Erectile dysfunction   . Visual changes   . Pancytopenia   . Chronic pancreatitis      Based on CT findings  . History of melanoma in situ     JAN 2012--  LEFT HEEL W/ SLN BX  . Chronic back pain   . IC (interstitial cystitis)   . BPH (benign prostatic hypertrophy)   . Cardiac pacemaker     CARDIOLOGIST-- DR Cristopher Peru (LAST PACE [facility] 05-15-2013)  . Asymptomatic carotid artery stenosis     BILATERAL MILD ICA---  <50% PER DUPLEX 03-08-2008  . History of syncope   . Hypertension   . CHB (complete heart block)   . Nonischemic cardiomyopathy   . Severe mitral regurgitation   . Urge urinary incontinence   . A-fib   . Dementia     Past Surgical History  Procedure Laterality Date  . Cataract extraction, bilateral  left  1997/   right 2003  . Pacemaker insertion  12-06-2008  DR GREGG TAYLOR    BiV PPM  --  [employer]  . Colonoscopy  08/18/2006    YKD:XIPJ granularity and erosions of the rectum of uncertain significance biopsied.  A long redundant, but otherwise normal-appearing colon.melanosi coli and minimal inflammation on bx  . Inguinal hernia repair Bilateral AS TEEN  . Interstim implant placement  2001  &  2007  . Cysto/ hod/  replacement interstim implant  05-14-2003  . Removal interstim implant/  cyst/  hod  04-14-2004  .  Revision interstim implant and replace generator  05-15-2009    left upper buttock for urge urinary incontinence  . Wide excision left heel and sln bx  JUNE 2012  . Tonsillectomy  1950  . Left elbow surgery  2002  . Back surgery  X3  LAST ONE'90's  . Transthoracic echocardiogram  12-06-2008    LVSF 55-60%/  MODERATE MR/  MILD AR/  QUESTION DISTAL POSTERIOR HYPOKINESIS  . Interstim implant revision N/A 08/28/2013    Procedure: REPLACEMENT OF INTERSTIM-GENERATOR AND LEAD ;  Surgeon: [name], MD;  Location: [facility];  Service: Urology;  Laterality: N/A;  . Esophagogastroduodenoscopy N/A 03/28/2014    Dr. [name]: normal esophagus s/p dilation, mosaic appearance - chronic inflammation noted on bx  . Savory dilation N/A  03/28/2014    Procedure: SAVORY DILATION;  Surgeon: [name], MD;  [facility];  Service: Endoscopy;  Laterality: N/A;  Venia Minks dilation N/A 03/28/2014    Procedure: Venia Minks DILATION;  Surgeon: [name], MD;  [facility]: [facility];  Service: Endoscopy;  Laterality: N/A;  . Colonoscopy N/A 08/05/2014    Procedure: COLONOSCOPY;  Surgeon: [name], MD;  [facility];  Service: Endoscopy;  Laterality: N/A;  PT NEEDS TCS AT 1000.  . Implantable cardioverter defibrillator (icd) generator change N/A 10/11/2014    Procedure: ICD GENERATOR CHANGE;  Surgeon: [name], MD;  Location: [facility];  Service: Cardiovascular;  Laterality: N/A;     Current Outpatient Prescriptions  Medication Sig Dispense Refill  . amitriptyline (ELAVIL) 10 MG tablet Take 10 mg by mouth at bedtime.    Marland Kitchen amLODipine (NORVASC) 2.5 MG tablet TAKE 1 TABLET (2.5 MG TOTAL) BY MOUTH DAILY. 90 tablet 1  . aspirin EC 81 MG tablet Take 162 mg by mouth daily as needed for mild pain or fever.    . dorzolamide-timolol (COSOPT) 22.3-6.8 MG/ML ophthalmic solution Place 1 drop into both eyes 2 (two) times daily.     . fentaNYL (DURAGESIC - DOSED MCG/HR) 25 MCG/HR patch Place 25 mcg onto the skin every 3 (three) days.    . furosemide (LASIX) 20 MG tablet Take 1 tablet (20 mg total) by mouth daily. 3 tablet 0  . hydrochlorothiazide (HYDRODIURIL) 25 MG tablet Take 1 tablet (25 mg total) by mouth daily. 90 tablet 3  . ibuprofen (ADVIL,MOTRIN) 200 MG tablet Take 200 mg by mouth every 6 (six) hours as needed for moderate pain.     Marland Kitchen latanoprost (XALATAN) 0.005 % ophthalmic solution Place 1 drop into both eyes at bedtime.     . Meth-Hyo-M Bl-Na Phos-Ph Sal (URIBEL) 118 MG CAPS Take 118 mg by mouth 3 (three) times daily as needed (bladder spasms).     . traMADol (ULTRAM) 50 MG tablet Take 50 mg by mouth daily as needed for moderate pain.  30 tablet 0   No current facility-administered medications for  this visit.    Allergies:   Codeine and Penicillins    Social History:  The patient  reports that he quit smoking about 17 years ago. His smoking use included Cigarettes. He smoked 0.02 packs per day. He has never used smokeless tobacco. He reports that he does not drink alcohol or use illicit drugs.   Family History:  The patient's family history includes Cancer (age of onset: 80) in his father; Dementia in his mother; Heart disease in his brother; Lung cancer in his sister; Throat cancer in his father.  ROS: .   All other systems are reviewed and negative.Unless otherwise mentioned in  H&P above.   PHYSICAL EXAM: VS:  There were no vitals taken for this visit. , BMI There is no weight on file to calculate BMI. GEN: Well nourished, well developed, in no acute distress HEENT: normal Neck: no JVD, carotid bruits, or masses Cardiac: RRR; no murmurs, rubs, or gallops,no edema  Respiratory:  Clear to auscultation bilaterally, normal work of breathing GI: soft, nontender, nondistended, + BS MS: no deformity or atrophy Skin: warm and dry, no rash Neuro:  Strength and sensation are intact Psych: euthymic mood, full affect     Recent Labs: 01/06/2015: ALT 26; B Natriuretic Peptide 664.0*; BUN 20; Creatinine 0.96; Hemoglobin 10.4*; Platelets 143*; Potassium 3.9; Sodium 140    Lipid Panel    Component Value Date/Time   CHOL 179 06/19/2013 1241   TRIG 66 06/19/2013 1241   HDL 51 06/19/2013 1241   CHOLHDL 3.5 06/19/2013 1241   VLDL 13 06/19/2013 1241   LDLCALC 115* 06/19/2013 1241      Wt Readings from Last 3 Encounters:  01/23/15 152 lb 1.3 oz (68.983 kg)  01/14/15 158 lb (71.668 kg)  01/06/15 162 lb (73.483 kg)      Other studies Reviewed: Additional studies/ records that were reviewed today include: None Review of the above records demonstrates: N/A   ASSESSMENT AND PLAN:  1. PAF: He is not on anticoagulation due to memory issues and history of non-compliance.    2. Hypertension: Well controlled. He is to no longer take lasix. I have removed this from his medication bag and have advised his wife to remove it from his daily pill boc. We will call the pharmacy to remove it from his refills. Home health should be sent to see him 2-3 times a week and is recommended to [facility] when they see him again. He is very forgetful and I am extremely concerned about his ability to care for himself, and his wife'; ability to care for him too. Social worker is to see him along with the [facility] service. Will see him again in one month with labs.    Current medicines are reviewed at length with the patient today.    Labs/ tests ordered today include: BMET No orders of the defined types were placed in this encounter.     Disposition:   FU with 1 month Signed, Jory Sims, NP  01/27/2015 7:20 AM    [facility] [address], [place], [place] [address] Phone: [phone]; Fax: [phone]

## 2015-01-27 NOTE — Assessment & Plan Note (Signed)
Improved strength and stability at this time, encouraged daily home strengthening exercises

## 2015-01-27 NOTE — Assessment & Plan Note (Signed)
Unchanged however pt and wife report using the fentanyl patch as needed

## 2015-01-27 NOTE — Assessment & Plan Note (Signed)
Controlled, no change in medication  

## 2015-01-27 NOTE — Assessment & Plan Note (Signed)
No agitation in recent times, but clear evidence of memory loss and confusion at home, would benefit from med which eh ahds been using in the past will reconsider re introduction of same

## 2015-01-27 NOTE — Patient Instructions (Signed)
Your physician recommends that you schedule a follow-up appointment in: 1 month  STOP TAKING: LASIX  Please take Lasix out of pill box.   Your physician recommends that you return for lab work in: [employer]  Thank you for choosing [facility]!

## 2015-01-27 NOTE — Patient Outreach (Signed)
Pt care coordination call received from [facility] the nurse from [name]' Cardiologist Brian Domino NP. Pt's status was discussed related to his declining mental and physical health and the possibility of having home health or personal care services to come in and assist with his care. Medications clarified, also spoke to the nurse about the necessity of pt weights related to his HF. At this point the risk of pt falling is too great to recommend daily weights. We will revisit this when home health and/or personal care services are arranged.   RNCM made call to [place] to report pt's status and let her know MD's request for pt to have in home care as soon as possible. [name] RN, BSN  [facility] Care Management [phone])

## 2015-01-27 NOTE — Assessment & Plan Note (Signed)
Rate controlled, anticoaguallation contraindicated

## 2015-01-27 NOTE — Assessment & Plan Note (Signed)
Asymptomatic , no leg edema or JVD on exam, clinically stable

## 2015-01-27 NOTE — Progress Notes (Deleted)
Name: [name]    DOB: Dec 28, 1925  Age: 81 y.o.  MR#: [id number]       PCP:  [name], MD      Insurance: Payor: MEDICARE / Plan: MEDICARE PART A AND B / Product Type: *No Product type* /   CC:    Chief Complaint  Patient presents with  . Congestive Heart Failure  . Cardiomyopathy  . Hypertension    VS Filed Vitals:   01/27/15 1412  BP: 116/82  Pulse: 76  Height: 5\' 11"  (1.803 m)  Weight: 150 lb (68.04 kg)    Weights Current Weight  01/27/15 150 lb (68.04 kg)  01/23/15 152 lb 1.3 oz (68.983 kg)  01/14/15 158 lb (71.668 kg)    Blood Pressure  BP Readings from Last 3 Encounters:  01/27/15 116/82  01/23/15 126/74  01/14/15 140/85     Admit date:  (Not on file) Last encounter with RMR:  12/30/2014   Allergy Codeine and Penicillins  Current Outpatient Prescriptions  Medication Sig Dispense Refill  . amitriptyline (ELAVIL) 10 MG tablet Take 10 mg by mouth at bedtime.    Marland Kitchen amLODipine (NORVASC) 2.5 MG tablet TAKE 1 TABLET (2.5 MG TOTAL) BY MOUTH DAILY. 90 tablet 1  . dorzolamide-timolol (COSOPT) 22.3-6.8 MG/ML ophthalmic solution Place 1 drop into both eyes 2 (two) times daily.     . fentaNYL (DURAGESIC - DOSED MCG/HR) 25 MCG/HR patch Place 25 mcg onto the skin every 3 (three) days.    . furosemide (LASIX) 20 MG tablet Take 1 tablet (20 mg total) by mouth daily. 3 tablet 0  . hydrochlorothiazide (HYDRODIURIL) 25 MG tablet Take 1 tablet (25 mg total) by mouth daily. 90 tablet 3  . latanoprost (XALATAN) 0.005 % ophthalmic solution Place 1 drop into both eyes at bedtime.     . Meth-Hyo-M Bl-Na Phos-Ph Sal (URIBEL) 118 MG CAPS Take 118 mg by mouth 3 (three) times daily as needed (bladder spasms).     . traMADol (ULTRAM) 50 MG tablet Take 50 mg by mouth daily as needed for moderate pain.  30 tablet 0   No current facility-administered medications for this visit.    Discontinued Meds:    Medications Discontinued During This Encounter  Medication Reason  . aspirin EC  81 MG tablet Error    Patient Active Problem List   Diagnosis Date Noted  . Recurrent falls 08/01/2014  . Atrial fibrillation, persistent 02/22/2014  . Bilateral leg weakness 05/01/2013  . Abnormal gait 06/12/2012  . GERD (gastroesophageal reflux disease) 05/09/2012  . Alzheimer's dementia 11/17/2011  . Backache 02/18/2010  . GEN OSTEOARTHROSIS INVOLVING MULTIPLE SITES 11/25/2009  . CENTRAL HEARING LOSS 11/10/2009  . CARDIAC PACEMAKER IN SITU 07/22/2009  . INTERSTITIAL CYSTITIS 07/14/2009  . Pancytopenia 12/31/2008  . MITRAL REGURGITATION, MODERATE 12/31/2008  . Chronic systolic heart failure 40/97/3532  . ERECTILE DYSFUNCTION 01/31/2008  . GLAUCOMA 01/31/2008  . Essential hypertension 01/31/2008    LABS    Component Value Date/Time   NA 140 01/06/2015 1955   NA 142 12/30/2014 1641   NA 140 10/07/2014 1355   K 3.9 01/06/2015 1955   K 4.0 12/30/2014 1641   K 3.8 10/07/2014 1355   CL 109 01/06/2015 1955   CL 107 12/30/2014 1641   CL 107 10/07/2014 1355   CO2 26 01/06/2015 1955   CO2 28 12/30/2014 1641   CO2 28 10/07/2014 1355   GLUCOSE 108* 01/06/2015 1955   GLUCOSE 96 12/30/2014 1641   GLUCOSE 83 10/07/2014  1355   BUN 20 01/06/2015 1955   BUN 31* 12/30/2014 1641   BUN 18 10/07/2014 1355   CREATININE 0.96 01/06/2015 1955   CREATININE 0.99 12/30/2014 1641   CREATININE 0.98 10/07/2014 1355   CREATININE 1.16 09/17/2014 1303   CREATININE 1.16 08/04/2014 0622   CREATININE 1.22 08/03/2014 0641   CALCIUM 8.4 01/06/2015 1955   CALCIUM 8.4 12/30/2014 1641   CALCIUM 8.5 10/07/2014 1355   GFRNONAA 73* 01/06/2015 1955   GFRNONAA 56* 08/04/2014 0622   GFRNONAA 52* 08/03/2014 0641   GFRAA 85* 01/06/2015 1955   GFRAA 64* 08/04/2014 0622   GFRAA 61* 08/03/2014 0641   CMP     Component Value Date/Time   NA 140 01/06/2015 1955   K 3.9 01/06/2015 1955   CL 109 01/06/2015 1955   CO2 26 01/06/2015 1955   GLUCOSE 108* 01/06/2015 1955   BUN 20 01/06/2015 1955   CREATININE  0.96 01/06/2015 1955   CREATININE 0.99 12/30/2014 1641   CALCIUM 8.4 01/06/2015 1955   PROT 6.7 01/06/2015 1955   ALBUMIN 3.3* 01/06/2015 1955   AST 36 01/06/2015 1955   ALT 26 01/06/2015 1955   ALKPHOS 84 01/06/2015 1955   BILITOT 1.5* 01/06/2015 1955   GFRNONAA 73* 01/06/2015 1955   GFRAA 85* 01/06/2015 1955       Component Value Date/Time   WBC 3.4* 01/06/2015 1955   WBC 3.3* 12/30/2014 1641   WBC 2.3* 10/07/2014 1355   HGB 10.4* 01/06/2015 1955   HGB 10.2* 12/30/2014 1641   HGB 10.5* 10/07/2014 1355   HCT 31.1* 01/06/2015 1955   HCT 31.0* 12/30/2014 1641   HCT 30.7* 10/07/2014 1355   MCV 97.5 01/06/2015 1955   MCV 96.6 12/30/2014 1641   MCV 94.5 10/07/2014 1355    Lipid Panel     Component Value Date/Time   CHOL 179 06/19/2013 1241   TRIG 66 06/19/2013 1241   HDL 51 06/19/2013 1241   CHOLHDL 3.5 06/19/2013 1241   VLDL 13 06/19/2013 1241   LDLCALC 115* 06/19/2013 1241    ABG No results found for: PHART, PCO2ART, PO2ART, HCO3, TCO2, ACIDBASEDEF, O2SAT   Lab Results  Component Value Date   TSH 1.805 04/25/2013   BNP (last 3 results)  Recent Labs  01/06/15 1955  BNP 664.0*    ProBNP (last 3 results) No results for input(s): PROBNP in the last 8760 hours.  Cardiac Panel (last 3 results) No results for input(s): CKTOTAL, CKMB, TROPONINI, RELINDX in the last 72 hours.  Iron/TIBC/Ferritin/ %Sat    Component Value Date/Time   IRON 57 08/01/2014 1534   TIBC 325 02/22/2011 1702   FERRITIN 119 08/01/2014 1534   IRONPCTSAT 6* 02/22/2011 1702     EKG Orders placed or performed in visit on 11/13/14  . EKG 12-Lead     Prior Assessment and Plan Problem List as of 01/27/2015      Cardiovascular and Mediastinum   MITRAL REGURGITATION, MODERATE   Last Assessment & Plan 08/08/2014 Office Visit Written 08/08/2014  4:06 PM by Lendon Colonel, NP    No apparent for repair or surgery at this time. Multiple comorbidities. Continue medical management.       Essential hypertension   Last Assessment & Plan 01/23/2015 Office Visit Written 01/27/2015  8:14 AM by Fayrene Helper, MD    Controlled, no change in medication       Chronic systolic heart failure   Last Assessment & Plan 01/23/2015 Office Visit Written 01/27/2015  8:15  AM by Fayrene Helper, MD    Asymptomatic , no leg edema or JVD on exam, clinically stable      Atrial fibrillation, persistent   Last Assessment & Plan 01/23/2015 Office Visit Written 01/27/2015  8:15 AM by Fayrene Helper, MD    Rate controlled, anticoaguallation contraindicated        Digestive   GERD (gastroesophageal reflux disease)   Last Assessment & Plan 09/03/2013 Office Visit Written 09/09/2013  2:31 PM by Fayrene Helper, MD    Controlled, no change in medication Followed by GI        Nervous and Auditory   CENTRAL HEARING LOSS   Last Assessment & Plan 12/28/2013 Office Visit Written 12/29/2013  9:11 AM by Fayrene Helper, MD    needs hearing aids, will advise checking belltone  For help with this      Alzheimer's dementia   Last Assessment & Plan 01/23/2015 Office Visit Written 01/27/2015  8:20 AM by Fayrene Helper, MD    No agitation in recent times, but clear evidence of memory loss and confusion at home, would benefit from med which eh ahds been using in the past will reconsider re introduction of same      Bilateral leg weakness   Last Assessment & Plan 01/23/2015 Office Visit Written 01/27/2015  8:16 AM by Fayrene Helper, MD    Improved strength and stability at this time, encouraged daily home strengthening exercises        Musculoskeletal and [place] 05/20/2014 Office Visit Written 05/26/2014  5:43 PM by Fayrene Helper, MD    Ongoing chronic pain in multiple joints with limitation in mobility Pt to use fenatnyl q 3 days 47mcg dose , discouraged from continuing tramadol along with this         Genitourinary   INTERSTITIAL CYSTITIS   Last Assessment & Plan 11/09/2013 Office Visit Written 11/10/2013  3:17 PM by Fayrene Helper, MD    C/o nocturia which disturbs his sleep, no improvement with recent procedure reportedly. I have advised him to try half valium at bedtime, urology had prescribed 3 times daily, he has not been taking this, and I suspect he would be unable to tolerate that high a dose. UA in the office is negative for infection. F/U with urology is to be arranged        Hematopoietic and Hemostatic   Pancytopenia   Last Assessment & Plan 03/01/2013 Office Visit Written 03/03/2013  3:04 PM by Fayrene Helper, MD    Improved numbers, followed by hematology        Other   ERECTILE DYSFUNCTION   Last Assessment & Plan 05/10/2011 Office Visit Written 05/10/2011 10:00 AM by Fayrene Helper, MD    Pt states that the cialis is not working as well, he still has a lot, advised him to discuss further with his urologist      [place] 09/03/2013 Office Visit Written 09/09/2013  2:35 PM by Fayrene Helper, MD    The importance of regular eye exam and compliance with treatment is stressed      Backache   Last Assessment & Plan 01/23/2015 Office Visit Written 01/27/2015  8:18 AM by Fayrene Helper, MD    Unchanged however pt and wife report using the fentanyl patch as needed      [place]  Last Assessment & Plan 10/07/2014 Office Visit Written 10/07/2014  1:30 PM by [name], MD    He has reached ERI on his current device. Will schedule for PPM generator change.      Abnormal gait   Recurrent falls   Last Assessment & Plan 11/13/2014 Office Visit Written 11/13/2014  3:22 PM by Imogene Burn, PA-C    Patient had recent fall after missing a step coming out of her first run. He has a fair amount of bruising around his left eye. CT scan showed no acute abnormality.          Imaging: Dg Chest 2 View  01/06/2015   CLINICAL  DATA:  Shortness of breath with exertion.  EXAM: CHEST  2 VIEW  COMPARISON:  12/30/2014  FINDINGS: The heart is mildly enlarged but stable. The pacer wires are stable. Stable tortuosity, ectasia and calcification of the thoracic aorta. The lungs are much better aerated with interval resolution of pulmonary edema. Mild central vascular congestion but no infiltrates or effusions. The bony structures are intact. Stable T11 compression fracture.  IMPRESSION: Resolution of pulmonary edema. Probable mild persistent vascular congestion.  No effusions or infiltrates.   Electronically Signed   By: Marijo Sanes M.D.   On: 01/06/2015 18:16   Dg Chest 2 View  12/30/2014   CLINICAL DATA:  Four week history of left-sided chest pain with shortness of breath; history of previous tobacco use.  EXAM: CHEST  2 VIEW  COMPARISON:  AP chest x-ray of August 13, 2014  FINDINGS: The lungs are adequately inflated. The interstitial markings are increased bilaterally in a fine line ground-glass type pattern. The cardiac silhouette is mildly enlarged but stable. The pulmonary vascularity is not engorged. There is mild tortuosity of the descending thoracic aorta. There is no pleural effusion. The permanent pacemaker is in reasonable position radiographically. The bony thorax is unremarkable.  IMPRESSION: Abnormally increased interstitial markings which have progressed since the previous study and are worrisome for pneumonitis. Low-grade CHF may be present as well.  A PA and lateral chest x-ray with deep inspiratory effort is recommended.   Electronically Signed   By: David  Martinique   On: 12/30/2014 16:24   Name: [name]    DOB: 1926-01-11  Age: 81 y.o.  MR#: [id number]       PCP:  [name], MD      Insurance: Payor: MEDICARE / Plan: MEDICARE PART A AND B / Product Type: *No Product type* /   CC:    Chief Complaint  Patient presents with  . Congestive Heart Failure  . Cardiomyopathy  . Hypertension    VS Filed Vitals:    01/27/15 1412  BP: 116/82  Pulse: 76  Height: 5\' 11"  (1.803 m)  Weight: 150 lb (68.04 kg)    Weights Current Weight  01/27/15 150 lb (68.04 kg)  01/23/15 152 lb 1.3 oz (68.983 kg)  01/14/15 158 lb (71.668 kg)    Blood Pressure  BP Readings from Last 3 Encounters:  01/27/15 116/82  01/23/15 126/74  01/14/15 140/85     Admit date:  (Not on file) Last encounter with RMR:  12/30/2014   Allergy Codeine and Penicillins  Current Outpatient Prescriptions  Medication Sig Dispense Refill  . amitriptyline (ELAVIL) 10 MG tablet Take 10 mg by mouth at bedtime.    Marland Kitchen amLODipine (NORVASC) 2.5 MG tablet TAKE 1 TABLET (2.5 MG TOTAL) BY MOUTH DAILY. 90 tablet 1  . dorzolamide-timolol (COSOPT) 22.3-6.8 MG/ML  ophthalmic solution Place 1 drop into both eyes 2 (two) times daily.     . fentaNYL (DURAGESIC - DOSED MCG/HR) 25 MCG/HR patch Place 25 mcg onto the skin every 3 (three) days.    . furosemide (LASIX) 20 MG tablet Take 1 tablet (20 mg total) by mouth daily. 3 tablet 0  . hydrochlorothiazide (HYDRODIURIL) 25 MG tablet Take 1 tablet (25 mg total) by mouth daily. 90 tablet 3  . latanoprost (XALATAN) 0.005 % ophthalmic solution Place 1 drop into both eyes at bedtime.     . Meth-Hyo-M Bl-Na Phos-Ph Sal (URIBEL) 118 MG CAPS Take 118 mg by mouth 3 (three) times daily as needed (bladder spasms).     . traMADol (ULTRAM) 50 MG tablet Take 50 mg by mouth daily as needed for moderate pain.  30 tablet 0   No current facility-administered medications for this visit.    Discontinued Meds:    Medications Discontinued During This Encounter  Medication Reason  . aspirin EC 81 MG tablet Error    Patient Active Problem List   Diagnosis Date Noted  . Recurrent falls 08/01/2014  . Atrial fibrillation, persistent 02/22/2014  . Bilateral leg weakness 05/01/2013  . Abnormal gait 06/12/2012  . GERD (gastroesophageal reflux disease) 05/09/2012  . Alzheimer's dementia 11/17/2011  . Backache 02/18/2010   . GEN OSTEOARTHROSIS INVOLVING MULTIPLE SITES 11/25/2009  . CENTRAL HEARING LOSS 11/10/2009  . CARDIAC PACEMAKER IN SITU 07/22/2009  . INTERSTITIAL CYSTITIS 07/14/2009  . Pancytopenia 12/31/2008  . MITRAL REGURGITATION, MODERATE 12/31/2008  . Chronic systolic heart failure 76/81/1572  . ERECTILE DYSFUNCTION 01/31/2008  . GLAUCOMA 01/31/2008  . Essential hypertension 01/31/2008    LABS    Component Value Date/Time   NA 140 01/06/2015 1955   NA 142 12/30/2014 1641   NA 140 10/07/2014 1355   K 3.9 01/06/2015 1955   K 4.0 12/30/2014 1641   K 3.8 10/07/2014 1355   CL 109 01/06/2015 1955   CL 107 12/30/2014 1641   CL 107 10/07/2014 1355   CO2 26 01/06/2015 1955   CO2 28 12/30/2014 1641   CO2 28 10/07/2014 1355   GLUCOSE 108* 01/06/2015 1955   GLUCOSE 96 12/30/2014 1641   GLUCOSE 83 10/07/2014 1355   BUN 20 01/06/2015 1955   BUN 31* 12/30/2014 1641   BUN 18 10/07/2014 1355   CREATININE 0.96 01/06/2015 1955   CREATININE 0.99 12/30/2014 1641   CREATININE 0.98 10/07/2014 1355   CREATININE 1.16 09/17/2014 1303   CREATININE 1.16 08/04/2014 0622   CREATININE 1.22 08/03/2014 0641   CALCIUM 8.4 01/06/2015 1955   CALCIUM 8.4 12/30/2014 1641   CALCIUM 8.5 10/07/2014 1355   GFRNONAA 73* 01/06/2015 1955   GFRNONAA 56* 08/04/2014 0622   GFRNONAA 52* 08/03/2014 0641   GFRAA 85* 01/06/2015 1955   GFRAA 64* 08/04/2014 0622   GFRAA 61* 08/03/2014 0641   CMP     Component Value Date/Time   NA 140 01/06/2015 1955   K 3.9 01/06/2015 1955   CL 109 01/06/2015 1955   CO2 26 01/06/2015 1955   GLUCOSE 108* 01/06/2015 1955   BUN 20 01/06/2015 1955   CREATININE 0.96 01/06/2015 1955   CREATININE 0.99 12/30/2014 1641   CALCIUM 8.4 01/06/2015 1955   PROT 6.7 01/06/2015 1955   ALBUMIN 3.3* 01/06/2015 1955   AST 36 01/06/2015 1955   ALT 26 01/06/2015 1955   ALKPHOS 84 01/06/2015 1955   BILITOT 1.5* 01/06/2015 1955   GFRNONAA 73* 01/06/2015 1955   GFRAA  85* 01/06/2015 1955        Component Value Date/Time   WBC 3.4* 01/06/2015 1955   WBC 3.3* 12/30/2014 1641   WBC 2.3* 10/07/2014 1355   HGB 10.4* 01/06/2015 1955   HGB 10.2* 12/30/2014 1641   HGB 10.5* 10/07/2014 1355   HCT 31.1* 01/06/2015 1955   HCT 31.0* 12/30/2014 1641   HCT 30.7* 10/07/2014 1355   MCV 97.5 01/06/2015 1955   MCV 96.6 12/30/2014 1641   MCV 94.5 10/07/2014 1355    Lipid Panel     Component Value Date/Time   CHOL 179 06/19/2013 1241   TRIG 66 06/19/2013 1241   HDL 51 06/19/2013 1241   CHOLHDL 3.5 06/19/2013 1241   VLDL 13 06/19/2013 1241   LDLCALC 115* 06/19/2013 1241    ABG No results found for: PHART, PCO2ART, PO2ART, HCO3, TCO2, ACIDBASEDEF, O2SAT   Lab Results  Component Value Date   TSH 1.805 04/25/2013   BNP (last 3 results)  Recent Labs  01/06/15 1955  BNP 664.0*    ProBNP (last 3 results) No results for input(s): PROBNP in the last 8760 hours.  Cardiac Panel (last 3 results) No results for input(s): CKTOTAL, CKMB, TROPONINI, RELINDX in the last 72 hours.  Iron/TIBC/Ferritin/ %Sat    Component Value Date/Time   IRON 57 08/01/2014 1534   TIBC 325 02/22/2011 1702   FERRITIN 119 08/01/2014 1534   IRONPCTSAT 6* 02/22/2011 1702     EKG Orders placed or performed in visit on 11/13/14  . EKG 12-Lead     Prior Assessment and Plan Problem List as of 01/27/2015      Cardiovascular and Mediastinum   MITRAL REGURGITATION, MODERATE   Last Assessment & Plan 08/08/2014 Office Visit Written 08/08/2014  4:06 PM by Lendon Colonel, NP    No apparent for repair or surgery at this time. Multiple comorbidities. Continue medical management.      Essential hypertension   Last Assessment & Plan 01/23/2015 Office Visit Written 01/27/2015  8:14 AM by Fayrene Helper, MD    Controlled, no change in medication       Chronic systolic heart failure   Last Assessment & Plan 01/23/2015 Office Visit Written 01/27/2015  8:15 AM by Fayrene Helper, MD    Asymptomatic , no  leg edema or JVD on exam, clinically stable      Atrial fibrillation, persistent   Last Assessment & Plan 01/23/2015 Office Visit Written 01/27/2015  8:15 AM by Fayrene Helper, MD    Rate controlled, anticoaguallation contraindicated        Digestive   GERD (gastroesophageal reflux disease)   Last Assessment & Plan 09/03/2013 Office Visit Written 09/09/2013  2:31 PM by Fayrene Helper, MD    Controlled, no change in medication Followed by GI        Nervous and Auditory   CENTRAL HEARING LOSS   Last Assessment & Plan 12/28/2013 Office Visit Written 12/29/2013  9:11 AM by Fayrene Helper, MD    needs hearing aids, will advise checking belltone  For help with this      Alzheimer's dementia   Last Assessment & Plan 01/23/2015 Office Visit Written 01/27/2015  8:20 AM by Fayrene Helper, MD    No agitation in recent times, but clear evidence of memory loss and confusion at home, would benefit from med which eh ahds been using in the past will reconsider re introduction of same      Bilateral leg weakness  Last Assessment & Plan 01/23/2015 Office Visit Written 01/27/2015  8:16 AM by Fayrene Helper, MD    Improved strength and stability at this time, encouraged daily home strengthening exercises        Musculoskeletal and Integument   GEN OSTEOARTHROSIS INVOLVING MULTIPLE SITES   Last Assessment & Plan 05/20/2014 Office Visit Written 05/26/2014  5:43 PM by Fayrene Helper, MD    Ongoing chronic pain in multiple joints with limitation in mobility Pt to use fenatnyl q 3 days 20mcg dose , discouraged from continuing tramadol along with this        Genitourinary   INTERSTITIAL CYSTITIS   Last Assessment & Plan 11/09/2013 Office Visit Written 11/10/2013  3:17 PM by Fayrene Helper, MD    C/o nocturia which disturbs his sleep, no improvement with recent procedure reportedly. I have advised him to try half valium at bedtime, urology had prescribed 3 times daily, he has not been  taking this, and I suspect he would be unable to tolerate that high a dose. UA in the office is negative for infection. F/U with urology is to be arranged        Hematopoietic and Hemostatic   Pancytopenia   Last Assessment & Plan 03/01/2013 Office Visit Written 03/03/2013  3:04 PM by Fayrene Helper, MD    Improved numbers, followed by hematology        Other   ERECTILE DYSFUNCTION   Last Assessment & Plan 05/10/2011 Office Visit Written 05/10/2011 10:00 AM by Fayrene Helper, MD    Pt states that the cialis is not working as well, he still has a lot, advised him to discuss further with his urologist      [place] 09/03/2013 Office Visit Written 09/09/2013  2:35 PM by Fayrene Helper, MD    The importance of regular eye exam and compliance with treatment is stressed      Backache   Last Assessment & Plan 01/23/2015 Office Visit Written 01/27/2015  8:18 AM by Fayrene Helper, MD    Unchanged however pt and wife report using the fentanyl patch as needed      [place] 10/07/2014 Office Visit Written 10/07/2014  1:30 PM by [name], MD    He has reached ERI on his current device. Will schedule for PPM generator change.      Abnormal gait   Recurrent falls   Last Assessment & Plan 11/13/2014 Office Visit Written 11/13/2014  3:22 PM by Imogene Burn, PA-C    Patient had recent fall after missing a step coming out of her first run. He has a fair amount of bruising around his left eye. CT scan showed no acute abnormality.          Imaging: Dg Chest 2 View  01/06/2015   CLINICAL DATA:  Shortness of breath with exertion.  EXAM: CHEST  2 VIEW  COMPARISON:  12/30/2014  FINDINGS: The heart is mildly enlarged but stable. The pacer wires are stable. Stable tortuosity, ectasia and calcification of the thoracic aorta. The lungs are much better aerated with interval resolution of pulmonary edema. Mild central vascular  congestion but no infiltrates or effusions. The bony structures are intact. Stable T11 compression fracture.  IMPRESSION: Resolution of pulmonary edema. Probable mild persistent vascular congestion.  No effusions or infiltrates.   Electronically Signed   By: Marijo Sanes M.D.   On:  01/06/2015 18:16   Dg Chest 2 View  12/30/2014   CLINICAL DATA:  Four week history of left-sided chest pain with shortness of breath; history of previous tobacco use.  EXAM: CHEST  2 VIEW  COMPARISON:  AP chest x-ray of August 13, 2014  FINDINGS: The lungs are adequately inflated. The interstitial markings are increased bilaterally in a fine line ground-glass type pattern. The cardiac silhouette is mildly enlarged but stable. The pulmonary vascularity is not engorged. There is mild tortuosity of the descending thoracic aorta. There is no pleural effusion. The permanent pacemaker is in reasonable position radiographically. The bony thorax is unremarkable.  IMPRESSION: Abnormally increased interstitial markings which have progressed since the previous study and are worrisome for pneumonitis. Low-grade CHF may be present as well.  A PA and lateral chest x-ray with deep inspiratory effort is recommended.   Electronically Signed   By: David  Martinique   On: 12/30/2014 16:24

## 2015-01-30 LAB — BASIC METABOLIC PANEL
BUN: 24 mg/dL — ABNORMAL HIGH (ref 6–23)
CALCIUM: 8.9 mg/dL (ref 8.4–10.5)
CO2: 29 meq/L (ref 19–32)
Chloride: 103 mEq/L (ref 96–112)
Creat: 1.28 mg/dL (ref 0.50–1.35)
Glucose, Bld: 102 mg/dL — ABNORMAL HIGH (ref 70–99)
Potassium: 3.8 mEq/L (ref 3.5–5.3)
SODIUM: 143 meq/L (ref 135–145)

## 2015-02-02 NOTE — Assessment & Plan Note (Signed)
Improved with recent in house therapy, he is to continue out pt in home PT/OT  For next approx 4 weeks

## 2015-02-02 NOTE — Assessment & Plan Note (Signed)
Clinically stable and asymptomatic, continue current management, followed by cardiology also

## 2015-02-02 NOTE — Assessment & Plan Note (Signed)
Should resume aricept which he ahs taken in the past will f/u on this

## 2015-02-02 NOTE — Assessment & Plan Note (Signed)
Controlled, no change in medication  

## 2015-02-02 NOTE — Assessment & Plan Note (Signed)
Rate controlled on current therapy, anticoagullation is contraindicated

## 2015-02-02 NOTE — Assessment & Plan Note (Signed)
Home safety issues reviewed with pt and spouse

## 2015-02-04 ENCOUNTER — Encounter: Payer: Self-pay | Admitting: *Deleted

## 2015-02-05 ENCOUNTER — Other Ambulatory Visit: Payer: Self-pay | Admitting: *Deleted

## 2015-02-05 NOTE — Patient Outreach (Signed)
[facility]) Care Management  02/05/2015  Zaire Levesque December 10, 1925 [id number]    Phone call to patient's spouse.  Home visit re-scheduled for 02/11/15 at 9:30am.  In home personal care services discussed, need emphasized.  Spouse reluctant to accept services, stating that she is managing okay now.  However, she did report that some day were harder than others.  Spouse willing to discuss personal care options at next scheduled home visit.  Spouse stated that they have an appointment with their lawyer on 02/12/15 to revise patient's will.  It was suggested that the Advanced Directive be notarized on that date as well.  Spouse requested another Advanced Directive form to take to the lawyer.  Personal care options and Advanced Directive to be brought to next appointment.   [name] Care Management [phone]

## 2015-02-06 DIAGNOSIS — Z961 Presence of intraocular lens: Secondary | ICD-10-CM | POA: Diagnosis not present

## 2015-02-06 DIAGNOSIS — H4011X3 Primary open-angle glaucoma, severe stage: Secondary | ICD-10-CM | POA: Diagnosis not present

## 2015-02-06 DIAGNOSIS — H4011X2 Primary open-angle glaucoma, moderate stage: Secondary | ICD-10-CM | POA: Diagnosis not present

## 2015-02-07 DIAGNOSIS — H463 Toxic optic neuropathy: Secondary | ICD-10-CM | POA: Diagnosis not present

## 2015-02-09 DIAGNOSIS — R0600 Dyspnea, unspecified: Secondary | ICD-10-CM | POA: Insufficient documentation

## 2015-02-09 NOTE — Progress Notes (Signed)
   Subjective:    Patient ID: [name], male    DOB: 1926/06/30, 81 y.o.   MRN: [id number]  HPI Pt in today with c/o increased shortness of breath with minimal activity, and he has fallen twice in the past 1 week. Denies any recent fever , chills, cough or chest paoin   Review of Systems See HPI Denies recent fever or chills. Denies sinus pressure, nasal congestion, ear pain or sore throat. Denies chest congestion, productive cough or wheezing. Denies abdominal pain, nausea, vomiting,diarrhea or constipation.   . Chronic generalized  joint pain, swellin and limitation in mobility.  Denies skin break down or rash.        Objective:   Physical Exam BP 146/78 mmHg  Pulse 78  Resp 18  Ht 5\' 11"  (1.803 m)  Wt 162 lb (73.483 kg)  BMI 22.60 kg/m2  SpO2 94% Patient alert  and in mild cardiopulmonary distress even at rest  HEENT: No facial asymmetry, EOMI,   oropharynx pink and moist.  Neck decreased ROM no JVD, no mass.  Chest: Clear to auscultation bilaterally.Decreased air entry throughout  CVS: S1, S2 n, systolic  , no S3.Regular rate.  ABD: Soft non tender.   Ext: No edema  MS: Decreased ROM spine, shoulders, hips and knees.        Assessment & Plan:  Recurrent falls Recurrent falls, 2 in the past week, and increased fatigue, pt sent to Ed for further evaluation   Essential hypertension Elevated at today's visit, no mmed adjustment made at thsi time howevcer, reports recurrent falls and weakness .   Dyspnea Recent increase with worsened  exercise tolerance and recurrent falls, pt sent to Ed for further eval

## 2015-02-09 NOTE — Assessment & Plan Note (Signed)
Recent increase with worsened  exercise tolerance and recurrent falls, pt sent to Ed for further eval

## 2015-02-09 NOTE — Assessment & Plan Note (Signed)
Recurrent falls, 2 in the past week, and increased fatigue, pt sent to Ed for further evaluation

## 2015-02-09 NOTE — Assessment & Plan Note (Signed)
Elevated at today's visit, no mmed adjustment made at thsi time howevcer, reports recurrent falls and weakness .

## 2015-02-11 ENCOUNTER — Other Ambulatory Visit: Payer: Self-pay | Admitting: *Deleted

## 2015-02-12 NOTE — Patient Outreach (Signed)
  [facility]) Care Management  [facility] Social Work  02/12/2015  [name] 02-14-1926 [id number]    Current Medications:  Current Outpatient Prescriptions  Medication Sig Dispense Refill  . amitriptyline (ELAVIL) 10 MG tablet Take 10 mg by mouth at bedtime.    . dorzolamide-timolol (COSOPT) 22.3-6.8 MG/ML ophthalmic solution Place 1 drop into both eyes 2 (two) times daily.     . fentaNYL (DURAGESIC - DOSED MCG/HR) 25 MCG/HR patch Place 25 mcg onto the skin every 3 (three) days.    Marland Kitchen latanoprost (XALATAN) 0.005 % ophthalmic solution Place 1 drop into both eyes at bedtime.     . Meth-Hyo-M Bl-Na Phos-Ph Sal (URIBEL) 118 MG CAPS Take 118 mg by mouth 3 (three) times daily as needed (bladder spasms).     . traMADol (ULTRAM) 50 MG tablet Take 50 mg by mouth daily as needed for moderate pain.  30 tablet 0  . amLODipine (NORVASC) 2.5 MG tablet TAKE 1 TABLET (2.5 MG TOTAL) BY MOUTH DAILY. (Patient not taking: Reported on 02/11/2015) 90 tablet 1  . hydrochlorothiazide (HYDRODIURIL) 25 MG tablet Take 1 tablet (25 mg total) by mouth daily. (Patient not taking: Reported on 02/11/2015) 90 tablet 3   No current facility-administered medications for this visit.    Functional Status:  In your present state of health, do you have any difficulty performing the following activities: 01/14/2015 01/06/2015  Hearing? - Y  Vision? - Y  Difficulty concentrating or making decisions? - Y  Walking or climbing stairs? - Y  Dressing or bathing? - Y  Doing errands, shopping? - N  Conservation officer, nature and eating ? N -  Using the Toilet? N -  In the past six months, have you accidently leaked urine? N -  Do you have problems with loss of bowel control? N -  Managing your Medications? N -  Managing your Finances? N -  Housekeeping or managing your Housekeeping? N -    Fall/Depression Screening:  PHQ 2/9 Scores 01/14/2015 01/14/2015 04/25/2013  PHQ - 2 Score 0 1 0    Assessment: This social worker  visited patient and spouse in his home to complete Advanced Directive and to discuss in home assistance options.  Patient was very engaged and friendly during the visit.  Patient was oriented X3.  Was able to discuss medication changes.  Advanced Directive completed. Per patient and spouse, they would like to make changes to patient's  Will and who he has named as his  Medical laboratory scientific officer.  They have an appointment with their lawyer on 02/12/15 to discuss these issues and to have Advanced Directive notarized. Patient and spouse agree to in home assistance at least once a week.  Two angencies contacted, [facility] [employer] [facility](do not cover [facility]) and [employer]( not taking new patient's at this time).   Plan: This social worker to continue to search to identify an agency to provide personal care services.    [name] [facility] Care Management [phone]

## 2015-02-13 ENCOUNTER — Other Ambulatory Visit: Payer: Self-pay | Admitting: *Deleted

## 2015-02-13 ENCOUNTER — Encounter: Payer: Self-pay | Admitting: *Deleted

## 2015-02-13 VITALS — BP 140/90 | HR 69 | Resp 24

## 2015-02-13 DIAGNOSIS — G309 Alzheimer's disease, unspecified: Principal | ICD-10-CM

## 2015-02-13 DIAGNOSIS — F0280 Dementia in other diseases classified elsewhere without behavioral disturbance: Secondary | ICD-10-CM
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
## 2015-02-14 ENCOUNTER — Other Ambulatory Visit: Payer: Self-pay | Admitting: *Deleted

## 2015-02-14 ENCOUNTER — Other Ambulatory Visit: Payer: Self-pay | Admitting: Pharmacist

## 2015-02-14 NOTE — Patient Outreach (Signed)
[facility]) Care Management  02/14/2015  [name] Jul 15, 1926 [id number]    Phone call to [facility] to complete referral for custodial care for patient.  Referral completed.  Per patient's wife, she has used [facility] in the past and is agreeable to them resuming care on a self pay basis.  Caryl Pina from [place] will contact patient and his spouse to complete needs assessment.   [name] [facility] Care Management [phone]

## 2015-02-14 NOTE — Patient Outreach (Signed)
RNCM called pt to inquire if pt was SOB, had increased swelling or was feeling worse or better since yesterday's visit. RNCM spoke with [name] pt's spouse. She stated pt's breathing was better and his swelling to his hands was improved. Spouse also reported pt had taken his blood pressure medication this morning. Spouse stated that she felt pt was much better today than he was yesterday. RNCM asked spouse to please call MD if pt's SOB became worse or if she noticed the swelling in the pt's hands or feet becoming worse. Spouse verbalized understanding.   Plan: RNCM will call pt next week to check on med adherence and s/s of HF.   [name] RN, BSN  [facility] Care Management [phone])

## 2015-02-14 NOTE — Patient Outreach (Signed)
[facility]) Care Management  [place]   02/14/2015  Vasilis Luhman 02-14-1926 [id number]  Subjective: Akeel Reffner is a 81 y.o. male who was referred to [place] for medication management. Patient has difficulty remembering to take his medications and per report, is not willing to use a pill box or have his wife help him.  I called patient to offer pharmacy services today but he declined. He reported that he was already being helped and that he did not need any additional help at this time. He would continue to work with the "girl" he is currently working with.  Objective:   Current Medications: Current Outpatient Prescriptions  Medication Sig Dispense Refill  . amitriptyline (ELAVIL) 10 MG tablet Take 10 mg by mouth at bedtime.    Marland Kitchen amLODipine (NORVASC) 2.5 MG tablet TAKE 1 TABLET (2.5 MG TOTAL) BY MOUTH DAILY. (Patient not taking: Reported on 02/11/2015) 90 tablet 1  . dorzolamide-timolol (COSOPT) 22.3-6.8 MG/ML ophthalmic solution Place 1 drop into both eyes 2 (two) times daily.     . fentaNYL (DURAGESIC - DOSED MCG/HR) 25 MCG/HR patch Place 25 mcg onto the skin every 3 (three) days.    . hydrochlorothiazide (HYDRODIURIL) 25 MG tablet Take 1 tablet (25 mg total) by mouth daily. (Patient not taking: Reported on 02/13/2015) 90 tablet 3  . latanoprost (XALATAN) 0.005 % ophthalmic solution Place 1 drop into both eyes at bedtime.     . Meth-Hyo-M Bl-Na Phos-Ph Sal (URIBEL) 118 MG CAPS Take 118 mg by mouth 3 (three) times daily as needed (bladder spasms).     . traMADol (ULTRAM) 50 MG tablet Take 50 mg by mouth daily as needed for moderate pain.  30 tablet 0   No current facility-administered medications for this visit.    Functional Status: In your present state of health, do you have any difficulty performing the following activities: 02/13/2015 01/14/2015  Hearing? [place]? Y -  Difficulty concentrating or making decisions? Y -  Walking or  climbing stairs? N -  Dressing or bathing? Y -  Doing errands, shopping? Y -  Conservation officer, nature and eating ? Y N  Using the Toilet? N N  In the past six months, have you accidently leaked urine? Y N  Do you have problems with loss of bowel control? N N  Managing your Medications? Y N  Managing your Finances? Y N  Housekeeping or managing your Housekeeping? Y N    Fall/Depression Screening: PHQ 2/9 Scores 01/14/2015 01/14/2015 04/25/2013  PHQ - 2 Score 0 1 0    Assessment: Patient is not interested in [place] [facility] pharmacy services. I think he may be overwhelmed by all of the help that he is receiving right now and I tried to explain how my role would be different but he still reported that he was not interested.   Plan: Will not open a pharmacy program at this time. Will update Janci Minor, RNCM, who referred patient for pharmacy services. Will be available for future assistance if patient changes his mind.  Nicoletta Ba, PharmD, [place] [facility] Resident [phone]

## 2015-02-21 ENCOUNTER — Other Ambulatory Visit: Payer: Self-pay | Admitting: *Deleted

## 2015-02-21 NOTE — Patient Outreach (Signed)
[facility]) Care Management  02/21/2015  [name] 1926-09-24 [id number]   Phone call to [facility] to follow up on referral made for custodial care for patient.  Per patient's spouse, she has not received a call form them yet to discuss services.  This social worker left a message for a return call.   [name] [facility] Care Management [phone]

## 2015-02-25 ENCOUNTER — Ambulatory Visit (INDEPENDENT_AMBULATORY_CARE_PROVIDER_SITE_OTHER): Payer: Medicare Other | Admitting: Family Medicine

## 2015-02-25 ENCOUNTER — Encounter: Payer: Self-pay | Admitting: Family Medicine

## 2015-02-25 VITALS — BP 140/90 | HR 91 | Resp 16 | Ht 71.0 in | Wt 158.0 lb

## 2015-02-25 DIAGNOSIS — F0280 Dementia in other diseases classified elsewhere without behavioral disturbance: Secondary | ICD-10-CM

## 2015-02-25 DIAGNOSIS — I4819 Other persistent atrial fibrillation: Secondary | ICD-10-CM

## 2015-02-25 DIAGNOSIS — I5022 Chronic systolic (congestive) heart failure: Secondary | ICD-10-CM

## 2015-02-25 DIAGNOSIS — H409 Unspecified glaucoma: Secondary | ICD-10-CM

## 2015-02-25 DIAGNOSIS — M5440 Lumbago with sciatica, unspecified side: Secondary | ICD-10-CM

## 2015-02-25 DIAGNOSIS — I481 Persistent atrial fibrillation: Secondary | ICD-10-CM

## 2015-02-25 DIAGNOSIS — G309 Alzheimer's disease, unspecified: Secondary | ICD-10-CM | POA: Diagnosis not present

## 2015-02-25 DIAGNOSIS — M159 Polyosteoarthritis, unspecified: Secondary | ICD-10-CM

## 2015-02-25 DIAGNOSIS — I10 Essential (primary) hypertension: Secondary | ICD-10-CM

## 2015-02-25 MED ORDER — FENTANYL 25 MCG/HR TD PT72: MEDICATED_PATCH | TRANSDERMAL | Status: DC

## 2015-02-25 MED ORDER — HYDROCHLOROTHIAZIDE 25 MG PO TABS: ORAL_TABLET | ORAL | Status: DC

## 2015-02-25 MED ORDER — TRAMADOL HCL 50 MG PO TABS: 50.0000 mg | ORAL_TABLET | Freq: Every day | ORAL | Status: DC | PRN

## 2015-02-25 NOTE — Patient Instructions (Signed)
Annual physical exam in 4 month, call if you need me before  Start taking HALF HCTZ tablet every morning , so you do not get fluid build up  Change pain patch every 3 days, mark on calender the days to change this  Tramadol is sent in for you   Blood pressure a bit high today  Thanks for choosing [facility], we consider it a privelige to serve you.

## 2015-02-25 NOTE — Progress Notes (Signed)
   Subjective:    Patient ID: [name], male    DOB: October 26, 1925, 81 y.o.   MRN: [id number]  HPI    Review of Systems     Objective:   Physical Exam BP 140/90 mmHg  Pulse 91  Resp 16  Ht 5\' 11"  (1.803 m)  Wt 158 lb (71.668 kg)  BMI 22.05 kg/m2  SpO2 95% Patient alert and in no cardiopulmonary distress.  HEENT: No facial asymmetry, EOMI,   oropharynx  moist.  Neck decreased though adequate ROm no JVD, no mass.  Chest: Clear to auscultation bilaterally.  CVS: S1, S2 systolic  murmur, no S3. Irregular rate.  ABD: Soft non tender.   Ext: No edema  MS: decreased  ROM spine, shoulders, hips and knees.  Skin: Intact, no ulcerations or rash noted.  Psych: Good eye contact, normal affect. Memory loss not anxious or depressed appearing.  CNS: CN 2-12 intact, power,  normal throughout.no focal deficits noted.        Assessment & Plan:  Essential hypertension Uncontrolled, pt tpo add HCTZ half daily as prescribed, he is encouraged to take in am  Chronic systolic heart failure Denies in current PND or orthopnea, does c/o intermittent leg edema, low sodium diet and fluid restriction  discussed  Atrial fibrillation, persistent Not a candidate for anti coagulation due to high fall risk  Alzheimer's dementia Non compliant with medication , will try to encourage the use of aricept when he returns to next visit  Glaucoma Followed by opthalmology and compliant with drops  Backache Pt has spinal stenosis, encouraged regular use as prescribed of his chronic pain medication

## 2015-02-27 ENCOUNTER — Encounter: Payer: Self-pay | Admitting: Adult Health

## 2015-02-27 ENCOUNTER — Ambulatory Visit (INDEPENDENT_AMBULATORY_CARE_PROVIDER_SITE_OTHER): Payer: Medicare Other | Admitting: Adult Health

## 2015-02-27 ENCOUNTER — Ambulatory Visit: Payer: Self-pay | Admitting: Adult Health

## 2015-02-27 ENCOUNTER — Other Ambulatory Visit: Payer: Self-pay | Admitting: *Deleted

## 2015-02-27 VITALS — BP 128/82 | HR 67 | Ht 71.0 in | Wt 157.0 lb

## 2015-02-27 DIAGNOSIS — I10 Essential (primary) hypertension: Secondary | ICD-10-CM

## 2015-02-27 NOTE — Progress Notes (Deleted)
Name: [name]    DOB: 1926/04/02  Age: 81 y.o.  MR#: [id number]       PCP:  [name], MD      Insurance: Payor: MEDICARE / Plan: MEDICARE PART A AND B / Product Type: *No Product type* /   CC:    Chief Complaint  Patient presents with  . Congestive Heart Failure    Systolic  . Cardiomyopathy    NICM  . Hypertension    VS Filed Vitals:   02/27/15 1308  BP: 128/82  Pulse: 67  Height: 5\' 11"  (1.803 m)  Weight: 157 lb (71.215 kg)  SpO2: 94%    Weights Current Weight  02/27/15 157 lb (71.215 kg)  02/25/15 158 lb (71.668 kg)  01/27/15 150 lb (68.04 kg)    Blood Pressure  BP Readings from Last 3 Encounters:  02/27/15 128/82  02/25/15 140/90  02/13/15 140/90     Admit date:  (Not on file) Last encounter with RMR:  01/27/2015   Allergy Codeine and Penicillins  Current Outpatient Prescriptions  Medication Sig Dispense Refill  . amLODipine (NORVASC) 2.5 MG tablet TAKE 1 TABLET (2.5 MG TOTAL) BY MOUTH DAILY. 90 tablet 1  . dorzolamide-timolol (COSOPT) 22.3-6.8 MG/ML ophthalmic solution Place 1 drop into both eyes 2 (two) times daily.     . fentaNYL (DURAGESIC - DOSED MCG/HR) 25 MCG/HR patch Place 25 mcg onto the skin every 3 (three) days.    Marland Kitchen latanoprost (XALATAN) 0.005 % ophthalmic solution Place 1 drop into both eyes at bedtime.     . traMADol (ULTRAM) 50 MG tablet Take 1 tablet (50 mg total) by mouth daily as needed for moderate pain. 30 tablet 2  . amitriptyline (ELAVIL) 10 MG tablet Take 10 mg by mouth at bedtime.    . hydrochlorothiazide (HYDRODIURIL) 25 MG tablet Half tablet every morning 15 tablet 3  . Meth-Hyo-M Bl-Na Phos-Ph Sal (URIBEL) 118 MG CAPS Take 118 mg by mouth 3 (three) times daily as needed (bladder spasms).      No current facility-administered medications for this visit.    Discontinued Meds:    Medications Discontinued During This Encounter  Medication Reason  . fentaNYL (DURAGESIC) 25 MCG/HR patch Error    Patient Active Problem  List   Diagnosis Date Noted  . Dyspnea 02/09/2015  . Recurrent falls 08/01/2014  . Atrial fibrillation, persistent 02/22/2014  . Bilateral leg weakness 05/01/2013  . Abnormal gait 06/12/2012  . GERD (gastroesophageal reflux disease) 05/09/2012  . Alzheimer's dementia 11/17/2011  . Backache 02/18/2010  . GEN OSTEOARTHROSIS INVOLVING MULTIPLE SITES 11/25/2009  . CENTRAL HEARING LOSS 11/10/2009  . CARDIAC PACEMAKER IN SITU 07/22/2009  . INTERSTITIAL CYSTITIS 07/14/2009  . Pancytopenia 12/31/2008  . MITRAL REGURGITATION, MODERATE 12/31/2008  . Chronic systolic heart failure 54/62/7035  . ERECTILE DYSFUNCTION 01/31/2008  . GLAUCOMA 01/31/2008  . Essential hypertension 01/31/2008    LABS    Component Value Date/Time   NA 143 01/27/2015 1533   NA 140 01/06/2015 1955   NA 142 12/30/2014 1641   K 3.8 01/27/2015 1533   K 3.9 01/06/2015 1955   K 4.0 12/30/2014 1641   CL 103 01/27/2015 1533   CL 109 01/06/2015 1955   CL 107 12/30/2014 1641   CO2 29 01/27/2015 1533   CO2 26 01/06/2015 1955   CO2 28 12/30/2014 1641   GLUCOSE 102* 01/27/2015 1533   GLUCOSE 108* 01/06/2015 1955   GLUCOSE 96 12/30/2014 1641   BUN 24* 01/27/2015 1533  BUN 20 01/06/2015 1955   BUN 31* 12/30/2014 1641   CREATININE 1.28 01/27/2015 1533   CREATININE 0.96 01/06/2015 1955   CREATININE 0.99 12/30/2014 1641   CREATININE 0.98 10/07/2014 1355   CREATININE 1.16 08/04/2014 0622   CREATININE 1.22 08/03/2014 0641   CALCIUM 8.9 01/27/2015 1533   CALCIUM 8.4 01/06/2015 1955   CALCIUM 8.4 12/30/2014 1641   GFRNONAA 73* 01/06/2015 1955   GFRNONAA 56* 08/04/2014 0622   GFRNONAA 52* 08/03/2014 0641   GFRAA 85* 01/06/2015 1955   GFRAA 64* 08/04/2014 0622   GFRAA 61* 08/03/2014 0641   CMP     Component Value Date/Time   NA 143 01/27/2015 1533   K 3.8 01/27/2015 1533   CL 103 01/27/2015 1533   CO2 29 01/27/2015 1533   GLUCOSE 102* 01/27/2015 1533   BUN 24* 01/27/2015 1533   CREATININE 1.28 01/27/2015 1533    CREATININE 0.96 01/06/2015 1955   CALCIUM 8.9 01/27/2015 1533   PROT 6.7 01/06/2015 1955   ALBUMIN 3.3* 01/06/2015 1955   AST 36 01/06/2015 1955   ALT 26 01/06/2015 1955   ALKPHOS 84 01/06/2015 1955   BILITOT 1.5* 01/06/2015 1955   GFRNONAA 73* 01/06/2015 1955   GFRAA 85* 01/06/2015 1955       Component Value Date/Time   WBC 3.4* 01/06/2015 1955   WBC 3.3* 12/30/2014 1641   WBC 2.3* 10/07/2014 1355   HGB 10.4* 01/06/2015 1955   HGB 10.2* 12/30/2014 1641   HGB 10.5* 10/07/2014 1355   HCT 31.1* 01/06/2015 1955   HCT 31.0* 12/30/2014 1641   HCT 30.7* 10/07/2014 1355   MCV 97.5 01/06/2015 1955   MCV 96.6 12/30/2014 1641   MCV 94.5 10/07/2014 1355    Lipid Panel     Component Value Date/Time   CHOL 179 06/19/2013 1241   TRIG 66 06/19/2013 1241   HDL 51 06/19/2013 1241   CHOLHDL 3.5 06/19/2013 1241   VLDL 13 06/19/2013 1241   LDLCALC 115* 06/19/2013 1241    ABG No results found for: PHART, PCO2ART, PO2ART, HCO3, TCO2, ACIDBASEDEF, O2SAT   Lab Results  Component Value Date   TSH 1.805 04/25/2013   BNP (last 3 results)  Recent Labs  01/06/15 1955  BNP 664.0*    ProBNP (last 3 results) No results for input(s): PROBNP in the last 8760 hours.  Cardiac Panel (last 3 results) No results for input(s): CKTOTAL, CKMB, TROPONINI, RELINDX in the last 72 hours.  Iron/TIBC/Ferritin/ %Sat    Component Value Date/Time   IRON 57 08/01/2014 1534   TIBC 325 02/22/2011 1702   FERRITIN 119 08/01/2014 1534   IRONPCTSAT 6* 02/22/2011 1702     EKG Orders placed or performed in visit on 11/13/14  . EKG 12-Lead     Prior Assessment and Plan Problem List as of 02/27/2015      Cardiovascular and Mediastinum   MITRAL REGURGITATION, MODERATE   Last Assessment & Plan 08/08/2014 Office Visit Written 08/08/2014  4:06 PM by Lendon Colonel, NP    No apparent for repair or surgery at this time. Multiple comorbidities. Continue medical management.      Essential hypertension    Last Assessment & Plan 01/06/2015 Office Visit Written 02/09/2015  5:26 PM by Fayrene Helper, MD    Elevated at today's visit, no mmed adjustment made at [facility] time howevcer, reports recurrent falls and weakness .      Chronic systolic heart failure   Last Assessment & Plan 09/09/2014 Office Visit Written  02/02/2015 10:57 AM by Fayrene Helper, MD    Clinically stable and asymptomatic, continue current management, followed by cardiology also      Atrial fibrillation, persistent   Last Assessment & Plan 09/09/2014 Office Visit Written 02/02/2015 10:57 AM by Fayrene Helper, MD    Rate controlled on current therapy, anticoagullation is contraindicated        Digestive   GERD (gastroesophageal reflux disease)   Last Assessment & Plan 09/03/2013 Office Visit Written 09/09/2013  2:31 PM by Fayrene Helper, MD    Controlled, no change in medication Followed by GI        Nervous and Auditory   CENTRAL HEARING LOSS   Last Assessment & Plan 12/28/2013 Office Visit Written 12/29/2013  9:11 AM by Fayrene Helper, MD    needs hearing aids, will advise checking belltone  For help with this      Alzheimer's dementia   Last Assessment & Plan 09/09/2014 Office Visit Written 02/02/2015 10:58 AM by Fayrene Helper, MD    Should resume aricept which he ahs taken in the past will f/u on this      Bilateral leg weakness   Last Assessment & Plan 09/09/2014 Office Visit Written 02/02/2015 10:59 AM by Fayrene Helper, MD    Improved with recent in house therapy, he is to continue out pt in home PT/OT  For next approx 4 weeks        Musculoskeletal and [place] 05/20/2014 Office Visit Written 05/26/2014  5:43 PM by Fayrene Helper, MD    Ongoing chronic pain in multiple joints with limitation in mobility Pt to use fenatnyl q 3 days 48mcg dose , discouraged from continuing tramadol along with this        Genitourinary    INTERSTITIAL CYSTITIS   Last Assessment & Plan 11/09/2013 Office Visit Written 11/10/2013  3:17 PM by Fayrene Helper, MD    C/o nocturia which disturbs his sleep, no improvement with recent procedure reportedly. I have advised him to try half valium at bedtime, urology had prescribed 3 times daily, he has not been taking this, and I suspect he would be unable to tolerate that high a dose. UA in the office is negative for infection. F/U with urology is to be arranged        Hematopoietic and Hemostatic   Pancytopenia   Last Assessment & Plan 03/01/2013 Office Visit Written 03/03/2013  3:04 PM by Fayrene Helper, MD    Improved numbers, followed by hematology        Other   ERECTILE DYSFUNCTION   Last Assessment & Plan 05/10/2011 Office Visit Written 05/10/2011 10:00 AM by Fayrene Helper, MD    Pt states that the cialis is not working as well, he still has a lot, advised him to discuss further with his urologist      [place] 09/03/2013 Office Visit Written 09/09/2013  2:35 PM by Fayrene Helper, MD    The importance of regular eye exam and compliance with treatment is stressed      Backache   Last Assessment & Plan 01/23/2015 Office Visit Written 01/27/2015  8:18 AM by Fayrene Helper, MD    Unchanged however pt and wife report using the fentanyl patch as needed      [place] 10/07/2014 Office  Visit Written 10/07/2014  1:30 PM by [name], MD    He has reached ERI on his current device. Will schedule for PPM generator change.      Abnormal gait   Recurrent falls   Last Assessment & Plan 01/06/2015 Office Visit Written 02/09/2015  5:26 PM by Fayrene Helper, MD    Recurrent falls, 2 in the past week, and increased fatigue, pt sent to [facility] for further evaluation      Dyspnea   Last Assessment & Plan 01/06/2015 Office Visit Written 02/09/2015  5:28 PM by Fayrene Helper, MD    Recent increase with worsened   exercise tolerance and recurrent falls, pt sent to Ed for further eval          Imaging: No results found.

## 2015-02-27 NOTE — Patient Instructions (Signed)
Your physician recommends that you schedule a follow-up appointment in: 3 months with Jory Sims, NP.  Your physician recommends that you schedule a follow-up appointment with Dr. Lovena Le   Your physician recommends that you continue on your current medications as directed. Please refer to the Current Medication list given to you today.  Your physician recommends that you return for lab work in: [place]) Before your next visit.   Thank you for choosing [facility]!

## 2015-02-27 NOTE — Progress Notes (Signed)
Cardiology Office Note   Date:  02/27/2015   ID:  Mcarthur Ivins, DOB 12-Sep-1926, MRN [id number]  PCP:  [name], MD  Cardiologist: Bryna Colander, NP   Chief Complaint  Patient presents with  . Congestive Heart Failure    Systolic  . Cardiomyopathy    NICM  . Hypertension      History of Present Illness: [name] is a 81 y.o. male who presents for ongoing assessment and management of chronic systolic CHF, NICM, Hypertension, and PPM in situ. He was last seen in the office on 4.25.2016. He was confused on his medications on last visit. He has some issues with memory loss and non-adherence to medications as a result. I sopped his lasix and removed the bottle from his bag. The pharmacy was called to remove it from his pill packs. HHN is involved concerning medication review and adherence. [facility] was also referred.   Follow up labs: Na;:143; Potassium 3.8; Chloride 103; CO2 129, Creatinine 1.28;BUN 24.   He has seen Dr.Simpson since last appt. She has placed him on HCTZ 12.5 mg daily. HHN and Social workers have been out to see him. They are working on assistance  for  He and his wife concerning help in their home.   He is concerned today about his pacemaker "poking through" as he has lost weight and is finding the pacemaker more prominent under his skin.     Past Medical History  Diagnosis Date  . Chronic systolic heart failure   . Rash and other nonspecific skin eruption   . Costochondritis, acute   . Glaucoma   . Erectile dysfunction   . Visual changes   . Pancytopenia   . Chronic pancreatitis     Based on CT findings  . History of melanoma in situ     JAN 2012--  LEFT HEEL W/ SLN BX  . Chronic back pain   . IC (interstitial cystitis)   . BPH (benign prostatic hypertrophy)   . Cardiac pacemaker     CARDIOLOGIST-- DR Cristopher Peru (LAST PACE [facility] 05-15-2013)  . Asymptomatic carotid artery stenosis     BILATERAL MILD ICA---  <50% PER DUPLEX 03-08-2008  .  History of syncope   . Hypertension   . CHB (complete heart block)   . Nonischemic cardiomyopathy   . Severe mitral regurgitation   . Urge urinary incontinence   . A-fib   . Dementia   . Alzheimer disease     Past Surgical History  Procedure Laterality Date  . Cataract extraction, bilateral  left  1997/   right 2003  . Pacemaker insertion  12-06-2008  DR GREGG TAYLOR    BiV PPM  --  [employer]  . Colonoscopy  08/18/2006    RKY:HCWC granularity and erosions of the rectum of uncertain significance biopsied.  A long redundant, but otherwise normal-appearing colon.melanosi coli and minimal inflammation on bx  . Inguinal hernia repair Bilateral AS TEEN  . Interstim implant placement  2001  &  2007  . Cysto/ hod/  replacement interstim implant  05-14-2003  . Removal interstim implant/  cyst/  hod  04-14-2004  . Revision interstim implant and replace generator  05-15-2009    left upper buttock for urge urinary incontinence  . Wide excision left heel and sln bx  JUNE 2012  . Tonsillectomy  1950  . Left elbow surgery  2002  . Back surgery  X3  LAST ONE'90's  . Transthoracic echocardiogram  12-06-2008  LVSF 55-60%/  MODERATE MR/  MILD AR/  QUESTION DISTAL POSTERIOR HYPOKINESIS  . Interstim implant revision N/A 08/28/2013    Procedure: REPLACEMENT OF INTERSTIM-GENERATOR AND LEAD ;  Surgeon: [name], MD;  Location: [facility];  Service: Urology;  Laterality: N/A;  . Esophagogastroduodenoscopy N/A 03/28/2014    Dr. [name]: normal esophagus s/p dilation, mosaic appearance - chronic inflammation noted on bx  . Savory dilation N/A 03/28/2014    Procedure: SAVORY DILATION;  Surgeon: [name], MD;  [facility];  Service: Endoscopy;  Laterality: N/A;  Venia Minks dilation N/A 03/28/2014    Procedure: Venia Minks DILATION;  Surgeon: [name], MD;  [facility];  Service: Endoscopy;  Laterality: N/A;  . Colonoscopy N/A 08/05/2014    Procedure:  COLONOSCOPY;  Surgeon: [name], MD;  [facility];  Service: Endoscopy;  Laterality: N/A;  PT NEEDS TCS AT 1000.  . Implantable cardioverter defibrillator (icd) generator change N/A 10/11/2014    Procedure: ICD GENERATOR CHANGE;  Surgeon: [name], MD;  Location: [facility] CATH LAB;  Service: Cardiovascular;  Laterality: N/A;     Current Outpatient Prescriptions  Medication Sig Dispense Refill  . amitriptyline (ELAVIL) 10 MG tablet Take 10 mg by mouth at bedtime.    Marland Kitchen amLODipine (NORVASC) 2.5 MG tablet TAKE 1 TABLET (2.5 MG TOTAL) BY MOUTH DAILY. 90 tablet 1  . dorzolamide-timolol (COSOPT) 22.3-6.8 MG/ML ophthalmic solution Place 1 drop into both eyes 2 (two) times daily.     . fentaNYL (DURAGESIC - DOSED MCG/HR) 25 MCG/HR patch Place 25 mcg onto the skin every 3 (three) days.    . fentaNYL (DURAGESIC) 25 MCG/HR patch Apply one patch every 3 days. NOTE dose reduction effective 05/20/2014 10 patch 0  . hydrochlorothiazide (HYDRODIURIL) 25 MG tablet Half tablet every morning 15 tablet 3  . latanoprost (XALATAN) 0.005 % ophthalmic solution Place 1 drop into both eyes at bedtime.     . Meth-Hyo-M Bl-Na Phos-Ph Sal (URIBEL) 118 MG CAPS Take 118 mg by mouth 3 (three) times daily as needed (bladder spasms).     . traMADol (ULTRAM) 50 MG tablet Take 1 tablet (50 mg total) by mouth daily as needed for moderate pain. 30 tablet 2   No current facility-administered medications for this visit.    Allergies:   Codeine and Penicillins    Social History:  The patient  reports that he quit smoking about 17 years ago. His smoking use included Cigarettes. He smoked 0.02 packs per day. He has never used smokeless tobacco. He reports that he does not drink alcohol or use illicit drugs.   Family History:  The patient's family history includes Cancer (age of onset: 51) in his father; Dementia in his mother; Heart disease in his brother; Lung cancer in his sister; Throat cancer in his father.     ROS: .   All other systems are reviewed and negative.Unless otherwise mentioned in  H&P above.   PHYSICAL EXAM: VS:  There were no vitals taken for this visit. , BMI There is no weight on file to calculate BMI. GEN: Well nourished, well developed, in no acute distress HEENT: normal Neck: no JVD, carotid bruits, or masses Cardiac: RRR; 2/6 systolic murmur radiating to the axilla, and into the abdominal arteries, rubs, or gallops,no edema  Respiratory:  clear to auscultation bilaterally, normal work of breathing GI: soft, nontender, nondistended, + BS MS: no deformity or atrophyPacemaker is prominent in the  upper left chest. No evidence of erosion or erythema.  Skin: warm and dry, no rash Neuro:  Strength and sensation are intact Psych: euthymic mood, full affect  Recent Labs: 01/06/2015: ALT 26; B Natriuretic Peptide 664.0*; Hemoglobin 10.4*; Platelets 143* 01/27/2015: BUN 24*; Creatinine 1.28; Potassium 3.8; Sodium 143    Lipid Panel    Component Value Date/Time   CHOL 179 06/19/2013 1241   TRIG 66 06/19/2013 1241   HDL 51 06/19/2013 1241   CHOLHDL 3.5 06/19/2013 1241   VLDL 13 06/19/2013 1241   LDLCALC 115* 06/19/2013 1241      Wt Readings from Last 3 Encounters:  02/25/15 158 lb (71.668 kg)  01/27/15 150 lb (68.04 kg)  01/23/15 152 lb 1.3 oz (68.983 kg)      Other studies Reviewed: Additional studies/ records that were reviewed today include: None Review of the above records demonstrates: N/A   ASSESSMENT AND PLAN:  1. Hypertension: Well controlled without symptoms of dizziness or lightheadedness. He is compliant with his medications with the help of HHN. Dr. Moshe Cipro has given him HCTZ 12.5 mg daily. I will recheck a BMET within the next week.   2. PPM in situ: Will make appt to have his PPM checked remotely. Has not been checked since being place. He will follow with Dr. Lovena Le for annual appt.   3. Permanent atrial fibrillation: Rate is controlled currently  He is to a candidate for anticoagulation due to dementia.   4. Systolic CHF: He does not appear to be decompensated currently. Will continue current medication regimen.    Current medicines are reviewed at length with the patient today.    Labs/ tests ordered today include: BMET No orders of the defined types were placed in this encounter.     Disposition:   FU with cardiology in 3 months. Signed, Jory Sims, NP  02/27/2015 7:04 AM    [facility] [address] [place], [place] [address] Phone: [phone]; Fax: [phone]

## 2015-02-27 NOTE — Patient Outreach (Signed)
[place] [facility]) Care Management  02/27/2015  Alecxander Mainwaring [birth year] [id number]    Phone call to patient's spouse to discuss request for custodial care.  Per patient's spouse, patient does not want any help coming into the home, however spouse states that he needs it.  It was requested that spouse contact [facility] to discuss custodial care details further.  Spouse states that she will call today.  Contact information given.   [name] [facility] Care Management [phone]

## 2015-02-28 ENCOUNTER — Other Ambulatory Visit: Payer: Self-pay | Admitting: *Deleted

## 2015-02-28 NOTE — Patient Outreach (Signed)
[place] [facility]) Care Management  02/28/2015  [name] [birth year] [id number]    Phone call to patient and spouse to follow up on custodial care coordination through [name].  Phone call rang several times, no message able to be left.  CSW to call back within 1 week.  [name] [facility] Care Management [phone]

## 2015-03-06 ENCOUNTER — Other Ambulatory Visit: Payer: Self-pay | Admitting: *Deleted

## 2015-03-06 NOTE — Patient Outreach (Signed)
[facility] [facility]) Care Management  03/06/2015  [name] 02/25/1926 [id number]   Care coordination call to [facility] home care to follow up on referral made.  Per Caryl Pina, care coordinator, she did receive a call from patient's spouse last week.  She made attempts to return the call, however could not reach patient.  Per Caryl Pina, her concern at this time is staffing.  [place] staffing available in that area at this time. This Education officer, museum will follow up with [facility] during home visit on 03/11/15.   [name] [facility] Care Management [phone]

## 2015-03-06 NOTE — Patient Outreach (Signed)
[place] [facility]) Care Management  03/06/2015  [name] 1926/04/20 [id number]   Phone call to patient and spouse to follow up on custodial care.  Phone just rang, no voicemail able to be left.  However, there is a home visit scheduled for 03/11/15.  CSW to follow up at that time.   [name] [facility] Care Management [phone]

## 2015-03-07 ENCOUNTER — Encounter: Payer: Self-pay | Admitting: *Deleted

## 2015-03-07 DIAGNOSIS — S63501A Unspecified sprain of right wrist, initial encounter: Secondary | ICD-10-CM | POA: Diagnosis not present

## 2015-03-07 DIAGNOSIS — Z8679 Personal history of other diseases of the circulatory system: Secondary | ICD-10-CM | POA: Diagnosis not present

## 2015-03-10 ENCOUNTER — Other Ambulatory Visit: Payer: Self-pay | Admitting: *Deleted

## 2015-03-10 NOTE — Patient Outreach (Signed)
Pt's wife called to confirm appointment for tomorrow. Pt thought appointment was with [facility] tomorrow, but appointment actually with [place]. Mrs. Dunivan also relayed pt was confused to place thinking he was at his other home in [place]. She stated he had gotten a hold of his car keys and she was having trouble getting them from him. Will relay this to [employer] so she can assess the situation related to the keys during her visit tomorrow am.   Plan: Will let SW know about pt having car keys.  RNCM will see pt next week as previously planned.   [name] RN, BSN  [facility] Care Management [phone])

## 2015-03-11 ENCOUNTER — Ambulatory Visit: Payer: Self-pay | Admitting: *Deleted

## 2015-03-11 ENCOUNTER — Other Ambulatory Visit: Payer: Self-pay | Admitting: *Deleted

## 2015-03-11 NOTE — Patient Outreach (Signed)
[place] [facility]) Care Management  03/11/2015  [name] [birth year] [id number]  Phone call to patient's spouse.  Home visit cancelled for today and will be re-scheduled.  CSW explained continued efftorts to identify agencies that provide custodial care in her county, however no agency has been identified to date.  Patient's spouse discussed continued contact with a in home aid-Jackie who worked for [facility]  in the past.  She has agreed to work with patient 1.5 hours twice a week.  Per patient's spouse she is familiar with patient and she trust her.  Spouse will contact in home aid this week to coordinate care.  Patient's spouse discussed concern with patient's continued efforts to drive. Per patient, he was able to make himself a extra set of car keys without her knowing it.   Patient's sister and niece visited the home to discuss driving concerns with patient however were met with resistance. Safety precautions emphasized, options explored in regards to initiating the conversation to express safety concerns emphasizing reassurance of a plan for alternative transportation.  Per patient's spouse, patient's Will and Advanced Directive completed with attorney on 02/12/15.   [name] [facility] Care Management [phone]

## 2015-03-17 ENCOUNTER — Other Ambulatory Visit: Payer: Self-pay | Admitting: *Deleted

## 2015-03-17 NOTE — Patient Outreach (Signed)
[place] [facility]) Care Management  03/17/2015  Mckinnon Glick 1926-05-01 [id number]   Phone call to [facility] care.  They have staffing for custodial care in [place]-per intake coordinator.  Discussed home care opportunity with patient's spouse.  She agrees with referral being made.  CSW will make referral for custodial care.   [name] [facility] Care Management [phone]

## 2015-03-18 ENCOUNTER — Other Ambulatory Visit: Payer: Self-pay | Admitting: *Deleted

## 2015-03-18 ENCOUNTER — Telehealth: Payer: Self-pay | Admitting: Adult Health

## 2015-03-18 ENCOUNTER — Telehealth: Payer: Self-pay | Admitting: *Deleted

## 2015-03-18 DIAGNOSIS — I10 Essential (primary) hypertension: Secondary | ICD-10-CM

## 2015-03-18 NOTE — Telephone Encounter (Signed)
Reviewed. If chest pain or dyspnea persists, he should be seen in ER. I agree.

## 2015-03-18 NOTE — Telephone Encounter (Signed)
Patient called and states that he is having chest pain and SOB. Patient advised to be evaluated in the ED. Patient voiced understanding. Spoke with wife and she stated that patient is confused and has dementia. Wife states that "It's not that bad". I strongly encouraged her to have him evaluated in the ED. Wife stated that he would have a nurse coming out to the house this morning and she wanted to wait until she came.

## 2015-03-18 NOTE — Patient Outreach (Signed)
[facility]) Care Management  03/18/2015  [name] 03-17-1926 [id number]   Referral made to [facility] by an Glenard Haring II for custodial care.  They will call patient's wife to schedule in home nursing assessment.   [name] [facility] Care Management [phone]

## 2015-03-18 NOTE — Telephone Encounter (Signed)
[facility] nurse Janci called and reports that patient's CP was resolved with Tramadol. Nurse reports that [name] has SOB when walking. V/S are BP 132/80, O2 Sat 94% , HR 80 and has +1 LE swelling.  Patient has not been taking his HCTZ  But has now agreed to take 1/2 a pill daily. Jory Sims, NP made aware and agreed to the change.

## 2015-03-18 NOTE — Telephone Encounter (Signed)
[name] is out w/ the pt now and would like to speak w/ a nurse

## 2015-03-18 NOTE — Patient Outreach (Signed)
[name] [facility]) Care Management   03/18/2015  [name] [birth year] [id number]  [name] is an 81 y.o. male  Subjective:"I had some chest pain last night, I called the doctor, they told me to come see them at the emergency room, but I don't need to do that. It's not hurting now, I just feel a little tired." "That pill makes me run to the bathroom all the time and causes a problem with my bladder that's why I haven't been taking it." "I didn't know I wasn't supposed to be eating salt, I will cut back on the salt."   Objective:Blood pressure 132/80, pulse 70, resp. rate 20, SpO2 98 %. While ambulating SpO2 94%, HR 84, Resp. 22.   Review of Systems  Musculoskeletal: Positive for falls.  Psychiatric/Behavioral: Positive for memory loss.    Physical Exam  Cardiovascular: Normal rate.   Murmur heard. Pulses:      Dorsalis pedis pulses are 2+ on the right side, and 2+ on the left side.  Pacemaker   Respiratory: Effort normal and breath sounds normal.  SOB with moderate exertion:O2 saturation remained WNL  GI: Soft. Bowel sounds are normal.  Musculoskeletal:       Right ankle: He exhibits swelling.       Left ankle: He exhibits swelling.  1+ edema noted to bilateral ankles and feet. Right lower extremity slightly more swollen.  Neurological: He is alert.  Skin: Skin is warm and dry.  Noted to have healing abrasions and skin tears to bilateral hands pt's spouse reports from pt working in the yard and has had some falls.   Psychiatric: He has a normal mood and affect. His speech is normal. Cognition and memory are impaired.    Current Medications:   Current Outpatient Prescriptions  Medication Sig Dispense Refill  . amLODipine (NORVASC) 2.5 MG tablet TAKE 1 TABLET (2.5 MG TOTAL) BY MOUTH DAILY. 90 tablet 1  . dorzolamide-timolol (COSOPT) 22.3-6.8 MG/ML ophthalmic solution Place 1 drop into both eyes 2 (two) times daily.     . fentaNYL (DURAGESIC - DOSED MCG/HR) 25 MCG/HR  patch Place 25 mcg onto the skin every 3 (three) days.    . hydrochlorothiazide (HYDRODIURIL) 25 MG tablet Half tablet every morning 15 tablet 3  . latanoprost (XALATAN) 0.005 % ophthalmic solution Place 1 drop into both eyes at bedtime.     . Meth-Hyo-M Bl-Na Phos-Ph Sal (URIBEL) 118 MG CAPS Take 118 mg by mouth 3 (three) times daily as needed (bladder spasms).     . traMADol (ULTRAM) 50 MG tablet Take 1 tablet (50 mg total) by mouth daily as needed for moderate pain. 30 tablet 2  . amitriptyline (ELAVIL) 10 MG tablet Take 10 mg by mouth at bedtime.     No current facility-administered medications for this visit.    Functional Status:   In your present state of health, do you have any difficulty performing the following activities: 02/13/2015 01/14/2015  Hearing? [name]? Y -  Difficulty concentrating or making decisions? Y -  Walking or climbing stairs? N -  Dressing or bathing? Y -  Doing errands, shopping? Y -  Conservation officer, nature and eating ? Y N  Using the Toilet? N N  In the past six months, have you accidently leaked urine? Y N  Do you have problems with loss of bowel control? N N  Managing your Medications? Y N  Managing your Finances? Y N  Housekeeping or managing your Housekeeping? [name]  N    Fall/Depression Screening:    PHQ 2/9 Scores 01/14/2015 01/14/2015 04/25/2013  PHQ - 2 Score 0 1 0   THN CM Care Plan Problem One        Patient Outreach from 03/18/2015 in [place] Problem One  Knowledge deficit of heart failure and hypertension evidenced by lack of med adherence related to dementia/Alzheimer's   Care Plan for Problem One  Active   THN Long Term Goal (31-90 days)  Pt will not be admitted to the hospital related to HF in the next 90 days   THN Long Term Goal Start Date  01/14/15   Interventions for Problem One Long Term Goal  Educated pt on the importance of taking all prescribed medications, educated pt on the importance of wife filling med box  for accuracy and consistancy, assisted wife in filling new pill box, wrote pt's meds clearly for easy reference   THN CM Short Term Goal #1 (0-30 days)  Pt will take all medications as prescribed for the next 30 days    THN CM Short Term Goal #1 Start Date  01/14/15   Interventions for Short Term Goal #1  Educated spouse on all medications and their doses, purpose, and appropriate times. Wrote a clear medication list with this information, assisted spouse in filling pill organizer. Educated pt on the importance of spouse taking over this duty.    THN CM Short Term Goal #2 (0-30 days)  Pt will obtain and drink one nutritional supplement per [name] for the next 30days    THN CM Short Term Goal #2 Start Date  01/14/15   [name] CM Short Term Goal #2 Met Date  02/13/15   Interventions for Short Term Goal #2  Educated wife on the importance of nutritious diet and the affects of dementia on the appetite.    THN CM Short Term Goal #3 (0-30 days)  Pt will take medications as prescribed for the next 30 days.    THN CM Short Term Goal #3 Start Date  03/18/15   Interventions for Short Tern Goal #3  Medication bottles labeled with different colored labels and clear messages describing med use to assist in pt not being confused on med purpose. Reeducated pt and spouse on medicine purposes and times. Talked with MD office  about pt reducing fluid medication to 1/2 tab instead of a whole to improve compliance.   THN CM Short Term Goal #4 (0-30 days)  Pt will follow a low salt diet for the next 30 days.   THN CM Short Term Goal #4 Start Date  03/18/15   Interventions for Short Term Goal #4  Educated pt and spouse on the importance of a low sodium diet. Assisted pt and spouse in reading labels to monitor sodium intake.     [place] Plan Problem Two        Patient Outreach Telephone from 03/18/2015 in [place]   Interventions for Short Term Goal #2  referral made to [facility] by an Glenard Haring II as it was confirmed  that they cover [employer], they will contact patient to schedule in home assessment      Assessment:  General: Alert, resting in his chair, Oriented x3. Pt noted to be clean, dressed appropriate and in no apparent distress. Pt did not immediately mention to [facility] he had called MD office this morning about having chest pain. Spouse reminded pt he had called and then pt  was able to remember. Pt denied any pain at the time stating the chest pain had occurred during the night and was relieved with his morning dose of tramadol. Pt VSS, with no SOB at rest. At [facility] request pt ambulated down driveway. Pt noted to be adequately steady with use of his cane. Pt noted to get mildly SOB after ambulating approx 52fet , O2 sat remained WNL, pt denied chest pain but did say he felt tired. RNCM placed a phone call to cardiology office K. Lawrence NP and spoke with her nurse KVarney Biles RNCM made [facility] Center[facility] pt was able to ambulate without chest pain, had mild SOB and fatigue w exertion, VSS, and pt did not want to go to the emergency department. Discussed with KVarney Bilesthat pt was refusing to take hydrochlorothiazide related to it causing him to go to to the bathroom too often and irritate his bladder. MD was ok with pt changing to half a tablet. Pt agreed to take half a tablet. Large colored labels made for medicine bottles, and a large print of instructions assisting pt to remember to take medications daily. Spouse reeducated on the signs and symptoms of a heart attack and the need to call 911 if pt. should have another episode of chest pain. Spouse voiced understanding.    HF: Mild SOB w exertion, c/o fatigue w exertion, not adhering to diuretic med as ordered, bilat lower extremities w 1+ edema.   Hypertension: B/P WNL at this time, pt reports taking b/p as prescribed, memory unreliable. Low sodium diet discussed.  Plan: RNCM educated spouse and pt on low sodium diet. RNCM educated pt on med adherence and made  assistance labels. RNCM will route this note to primary care MD office.  RNCM will see pt in 1 month at pt's home.   JRutherford LimerickRN, BSN  [facility] Center[facility] [phone]

## 2015-03-21 ENCOUNTER — Other Ambulatory Visit: Payer: Self-pay

## 2015-03-21 MED ORDER — FENTANYL 25 MCG/HR TD PT72: 25.0000 ug | MEDICATED_PATCH | TRANSDERMAL | Status: DC

## 2015-03-27 ENCOUNTER — Other Ambulatory Visit: Payer: Self-pay | Admitting: *Deleted

## 2015-03-27 NOTE — Patient Outreach (Signed)
[facility]) Care Management  03/27/2015  Yahye Siebert 1925-11-01 [id number]  Care coordination phone call to patient to schedule next home visit.  Also confirmed if [employer] contacted patient about in home care.  Per patient's spouse, the agency did call, however stated that their hourly cost would be$ 22.00 per hour.  Per patient's spouse, she has declined this service.  Home visit scheduled for 04/01/15 at 9 am to discuss alternative plan.   [name] [facility] Care Management [phone]

## 2015-04-01 ENCOUNTER — Other Ambulatory Visit: Payer: Self-pay | Admitting: *Deleted

## 2015-04-01 NOTE — Patient Outreach (Signed)
  Brian [name]) Care Management  [facility] [name] Social Work  04/01/2015  Brian [name] [birth year] [id number]    Current Medications:  Current Outpatient Prescriptions  Medication Sig Dispense Refill  . amLODipine (NORVASC) 2.5 MG tablet TAKE 1 TABLET (2.5 MG TOTAL) BY MOUTH DAILY. 90 tablet 1  . dorzolamide-timolol (COSOPT) 22.3-6.8 MG/ML ophthalmic solution Place 1 drop into both eyes 2 (two) times daily.     . fentaNYL (DURAGESIC - DOSED MCG/HR) 25 MCG/HR patch Place 1 patch (25 mcg total) onto the skin every 3 (three) days. 10 patch 0  . hydrochlorothiazide (HYDRODIURIL) 25 MG tablet Half tablet every morning 15 tablet 3  . latanoprost (XALATAN) 0.005 % ophthalmic solution Place 1 drop into both eyes at bedtime.     . Meth-Hyo-M Bl-Na Phos-Ph Sal (URIBEL) 118 MG CAPS Take 118 mg by mouth 3 (three) times daily as needed (bladder spasms).     . traMADol (ULTRAM) 50 MG tablet Take 1 tablet (50 mg total) by mouth daily as needed for moderate pain. 30 tablet 2  . amitriptyline (ELAVIL) 10 MG tablet Take 10 mg by mouth at bedtime.     No current facility-administered medications for this visit.    Functional Status:  In your present state of health, do you have any difficulty performing the following activities: 02/13/2015 01/14/2015  Hearing? [place]? Y -  Difficulty concentrating or making decisions? Y -  Walking or climbing stairs? N -  Dressing or bathing? Y -  Doing errands, shopping? Y -  Conservation officer, nature and eating ? Y N  Using the Toilet? N N  In the past six months, have you accidently leaked urine? Y N  Do you have problems with loss of bowel control? N N  Managing your Medications? Y N  Managing your Finances? Y N  Housekeeping or managing your Housekeeping? Y N    Fall/Depression Screening:  PHQ 2/9 Scores 01/14/2015 01/14/2015 04/25/2013  PHQ - 2 Score 0 1 0    Assessment: CSW visited patient's home to follow up on personal care services and advance directives.  CSW [place] observing. Personal care services reviewed, patient continues to decline need for an aid. However, is willing to allow a previous home health aid that he is familiar with to come in for 1 1/2 hours a day, twice a week to start after the fourth of July holiday to assist "his wife" which in turn will assist him. Patient's medications were reviewed. Patient continues to refuse his elavil medication. RNCM to be notified. Patient's feet observed to be swollen. Patient's spouse urged patient to elevate them. He complied.  Advance directive completed at previous visit and it was confirmed that the documents were notarized on 02/12/15. Patient encouraged to make copies to take to doctor's office.  Patient also had legal documents naming patient's spouse as Hospital doctor.  Plan: CSW will follow up with patient's wife next month regarding the start date for personal care services, 1 1/2 hours a day twice per week.  Brian [name] Brian [name] Care Management [phone]

## 2015-04-04 ENCOUNTER — Encounter: Payer: Self-pay | Admitting: Family Medicine

## 2015-04-04 ENCOUNTER — Telehealth: Payer: Self-pay | Admitting: Family Medicine

## 2015-04-04 NOTE — Assessment & Plan Note (Signed)
Pt has spinal stenosis, encouraged regular use as prescribed of his chronic pain medication

## 2015-04-04 NOTE — Assessment & Plan Note (Signed)
Followed by opthalmology and compliant with drops

## 2015-04-04 NOTE — Assessment & Plan Note (Signed)
Not a candidate for anti coagulation due to high fall risk

## 2015-04-04 NOTE — Assessment & Plan Note (Signed)
Uncontrolled, pt tpo add HCTZ half daily as prescribed, he is encouraged to take in am

## 2015-04-04 NOTE — Assessment & Plan Note (Signed)
Non compliant with medication , will try to encourage the use of aricept when he returns to next visit

## 2015-04-04 NOTE — Assessment & Plan Note (Signed)
Denies in current PND or orthopnea, does c/o intermittent leg edema, low sodium diet and fluid restriction  discussed

## 2015-04-04 NOTE — Telephone Encounter (Signed)
pls contact his social worker , [employer], let her know I recommend pt resume aricept for dementia, not sure who has noted elavil for dementia, but not accurate, he needs to get back on the aricept, if pt is in agreement the 5mg  dose daily needs to be sent for 30 days refill 3 please, and pls let her knmwo that I do agree with family conference that she is trying to arrange with the couple's children

## 2015-04-11 ENCOUNTER — Other Ambulatory Visit: Payer: Self-pay | Admitting: *Deleted

## 2015-04-11 ENCOUNTER — Other Ambulatory Visit: Payer: Medicare Other | Admitting: *Deleted

## 2015-04-11 ENCOUNTER — Other Ambulatory Visit: Payer: Self-pay | Admitting: Internal Medicine

## 2015-04-11 ENCOUNTER — Other Ambulatory Visit: Payer: Self-pay | Admitting: Family Medicine

## 2015-04-11 VITALS — BP 128/80 | HR 68 | Resp 18

## 2015-04-11 DIAGNOSIS — F0280 Dementia in other diseases classified elsewhere without behavioral disturbance: Secondary | ICD-10-CM

## 2015-04-11 DIAGNOSIS — G309 Alzheimer's disease, unspecified: Principal | ICD-10-CM

## 2015-04-11 NOTE — Patient Outreach (Signed)
[name] Care Management   04/11/2015  [name] 04/16/1926 [id number]  [name] is an 81 y.o. male  Subjective: "I am feeling weak in my legs when I walk." "I am not going to let my memory get any worse."   Objective: Blood pressure 128/80, pulse 68, resp. rate 18, SpO2 97 %.  Review of Systems  Constitutional: Positive for weight loss.  Cardiovascular: Positive for leg swelling. Negative for chest pain.  Musculoskeletal: Positive for joint pain. Negative for falls.  Neurological: Positive for weakness.  Endo/Heme/Allergies: Bruises/bleeds easily.  Psychiatric/Behavioral: Positive for memory loss.    Physical Exam  Constitutional: He is cooperative.  Cardiovascular:  Murmur heard. Pulses:      Dorsalis pedis pulses are 2+ on the right side, and 2+ on the left side.  Respiratory: Effort normal and breath sounds normal.  GI: Soft. Normal appearance and bowel sounds are normal.  Pt stating he was having soft stool not diarrhea so he decided to take an OTC anti-diarrheal.  Musculoskeletal:       Right ankle: He exhibits swelling.       Left ankle: He exhibits swelling.       Feet:  Neurological: He is alert.  Pt able to state he was at home, but did struggle with completing his address when asked. Pt complained of "feeling weak in his legs when walking"   Skin: Skin is warm, dry and intact.  Pt with small healing bruises to bilateral lower arms. Bilateral feet noted to have blackened and unkept toe nails.  Psychiatric: He has a normal mood and affect. His speech is normal and behavior is normal. He exhibits abnormal recent memory.  Memory affected by Alzheimer's/dementia.    Current Medications:  Please see medication review for current med list.  Current Outpatient Prescriptions  Medication Sig Dispense Refill  . hydrochlorothiazide (HYDRODIURIL) 25 MG tablet Half tablet every morning 15 tablet 3  . loperamide (LOPERAMIDE A-D) 2 MG tablet Take 2 mg  by mouth 4 (four) times daily as needed for diarrhea or loose stools.    . Meth-Hyo-M Bl-Na Phos-Ph Sal (URIBEL) 118 MG CAPS Take 118 mg by mouth 3 (three) times daily as needed (bladder spasms).     . traMADol (ULTRAM) 50 MG tablet Take 1 tablet (50 mg total) by mouth daily as needed for moderate pain. 30 tablet 2  . amitriptyline (ELAVIL) 10 MG tablet Take 10 mg by mouth at bedtime.    Marland Kitchen amLODipine (NORVASC) 2.5 MG tablet TAKE 1 TABLET (2.5 MG TOTAL) BY MOUTH DAILY. 90 tablet 1  . diclofenac sodium (VOLTAREN) 1 % GEL Apply 1 application topically 3 (three) times daily.    . dorzolamide-timolol (COSOPT) 22.3-6.8 MG/ML ophthalmic solution Place 1 drop into both eyes 2 (two) times daily.     . fentaNYL (DURAGESIC - DOSED MCG/HR) 25 MCG/HR patch Place 1 patch (25 mcg total) onto the skin every 3 (three) days. 10 patch 0  . latanoprost (XALATAN) 0.005 % ophthalmic solution Place 1 drop into both eyes at bedtime.      No current facility-administered medications for this visit.    Functional Status:   In your present state of health, do you have any difficulty performing the following activities: 02/25/2015 02/13/2015  Hearing? [name]  Vision? Y Y  Difficulty concentrating or making decisions? [name]  Walking or climbing stairs? Y N  Dressing or bathing? Y Y  Doing errands, shopping? [name]  Conservation officer, nature and  eating ? - Y  Using the Toilet? - N  In the past six months, have you accidently leaked urine? - Y  Do you have problems with loss of bowel control? - N  Managing your Medications? - Y  Managing your Finances? - Y  Housekeeping or managing your Housekeeping? - Y    Fall/Depression Screening:    PHQ 2/9 Scores 01/14/2015 01/14/2015 04/25/2013  PHQ - 2 Score 0 1 0    Assessment: Co-visit with SW [name]. Alzheimer's: Alert and oriented to person place and time. Able to name children, grandchildren, and most of his current address. Unable to recall current appointments, forgets to take  medications, noted to have 3 bottles of the same medication and stated to be taking them all thinking they were different medications. Wife was unaware pt had been doing this. Pt unable to read medications on the bottles. Discussed with pt that Alzheimer's effects memory and gets worse, pt denies he has any issues with memory and he stated he was not going to allow it to get worse. RNCM explained to pt and wife that this was a progressive disease that will get worse and it was important to have a plan in place as it does. Pt's wife's daughter invited to come and discuss possibilities to increase support for her mom and step-dad. Daughter supportive and involved in the discussion. Daughter stating she feels her mother needs more help with taking care of the large property and Mr. Straw. Daughter states, " [name] unrealistic about how much these services should cost and even though she knows her mom has the money she doesn't want to spend it." Daughter states she will discuss with her mom the realistic prices and the necessity of obtaining some help. [name] also stated although she knows she needs help she is fearful about living alone. HF: Lungs clear, Heart w murmur noted, 1+ edema to bilateral lower extremities. Medication for b/p called into pharmacy. Communication order sent to pharmacy to assist with finding better solution for pt managing medications.   Plan:  RNCM will make a future visit with pharmacy. RNCM will route this communication to MD. [facility] printed and discussed with Daughter, [name], and [name] ways to keep someone safe in the home with dementia. RNCM will maintain in contact with SW on the progress of obtaining in home assistance. [name] RN, BSN  [facility] Care Management [phone])
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
## 2015-04-14 ENCOUNTER — Ambulatory Visit: Payer: Self-pay | Admitting: *Deleted

## 2015-04-14 NOTE — Patient Outreach (Signed)
[facility]) Care Management  04/14/2015  Christie Copley [birth year] [id number]   Request from Bessie, RN to assign Pharmacy,  [name], PharmD assigned.  [name]. [place], [place] [facility] [place] Assistant Phone: [phone] Fax: [phone]

## 2015-04-15 ENCOUNTER — Other Ambulatory Visit: Payer: Self-pay

## 2015-04-15 MED ORDER — AMLODIPINE BESYLATE 2.5 MG PO TABS: 2.5000 mg | ORAL_TABLET | Freq: Every day | ORAL | Status: DC

## 2015-04-16 ENCOUNTER — Other Ambulatory Visit (HOSPITAL_COMMUNITY): Payer: Self-pay | Admitting: Orthopaedic Surgery

## 2015-04-16 ENCOUNTER — Other Ambulatory Visit: Payer: Self-pay

## 2015-04-16 DIAGNOSIS — M25572 Pain in left ankle and joints of left foot: Secondary | ICD-10-CM | POA: Diagnosis not present

## 2015-04-16 DIAGNOSIS — M25571 Pain in right ankle and joints of right foot: Secondary | ICD-10-CM | POA: Diagnosis not present

## 2015-04-16 DIAGNOSIS — M25562 Pain in left knee: Secondary | ICD-10-CM | POA: Diagnosis not present

## 2015-04-16 DIAGNOSIS — I10 Essential (primary) hypertension: Secondary | ICD-10-CM

## 2015-04-16 DIAGNOSIS — Z8679 Personal history of other diseases of the circulatory system: Secondary | ICD-10-CM | POA: Diagnosis not present

## 2015-04-16 DIAGNOSIS — M25561 Pain in right knee: Secondary | ICD-10-CM | POA: Diagnosis not present

## 2015-04-16 MED ORDER — HYDROCHLOROTHIAZIDE 25 MG PO TABS: ORAL_TABLET | ORAL | Status: DC

## 2015-04-16 MED ORDER — AMLODIPINE BESYLATE 2.5 MG PO TABS: 2.5000 mg | ORAL_TABLET | Freq: Every day | ORAL | Status: DC

## 2015-04-18 ENCOUNTER — Encounter: Payer: Self-pay | Admitting: Pharmacist

## 2015-04-18 NOTE — Patient Outreach (Signed)
[name] is a 81 y.o. male referred to pharmacy for assistance with his medication adherence. Received a message from Nurse Care Manager Janci Minor letting me know that she has scheduled an appointment for [place] to see [name] in his home on 04/21/15 at [place], [place] [facility] [phone]

## 2015-04-21 ENCOUNTER — Other Ambulatory Visit: Payer: Self-pay | Admitting: *Deleted

## 2015-04-21 ENCOUNTER — Other Ambulatory Visit: Payer: Self-pay | Admitting: Pharmacist

## 2015-04-21 NOTE — Patient Outreach (Signed)
[place] [facility]) Care Management   04/21/2015  [name] [birth year] [id number]  [name] is an 81 y.o. male  Subjective: "I fell going to the doctor but I only skinned my nose." "I don't understand why the doctor is only looking at one knee when both legs hurt and get weak when I am walking."    Objective:   Review of Systems  Unable to perform ROS Cardiovascular: Negative for leg swelling.     Physical Exam  Constitutional: Vital signs are normal.  Cardiovascular: Normal rate and regular rhythm.   Respiratory: Breath sounds normal.  GI: Soft. Bowel sounds are normal.  Musculoskeletal: He exhibits no edema.  Neurological: He is alert.  Skin: Skin is warm and dry. Abrasion noted.       Current Medications:  Please see pharmacy notes per co-visit with [name]   Assessment:See physical assessment as above.  RNCM called CT office(while at pt's home) to inquire if there were any special instructions related to the CT planned for pt this week. Also asked CT assistant if there was any reason only one leg was being tested, CT assistant stated the "arthogram" is only done on one joint at a time to prevent both legs being sore at the same time. RNCM relayed this information to the pt and his spouse.   Spouse reports [name] had started coming to assist with housework and cooking. She expressed it was a great help.   Pharmacist Grayland Ormond discussed with pt and spouse the importance of using a Pill Organization Box to assist pt and spouse in assuring pt does not over or under dose medications. Pt agreeable at this time and pharmacist assisted pt in filling pill box for this month. Related to insurance issues the pt's medications were unable to be filled into the blister packaging, as discussed before as an option.  Pt reported a fall going into the orthopedic MD office, sustaining a scrape to his nose. RNCM discussed with the pt the importance of using his  cane, especially with the weakness he had been experiencing in his legs.   Plan:  RNCM will follow up with pt in 2 weeks for routine home visit.

## 2015-04-21 NOTE — Patient Outreach (Addendum)
[name]) Care Management  [place]   04/21/2015  [name] Apr 20, 1926 [id number]  Subjective: [name] is Brian 81 y.o. gentleman referred to pharmacy for assistance with medication adherence, specifically related to his memory. Met with Mr and Mrs. [name] today in their home, accompanied by Nurse Care Manager [name].  Patient reports that he fell last Thursday going into his doctor's office. Didn't have his cane. Hit his nose. Patient attributed the fall to the weakness in right leg. Wife reports that he doesn't always want to use his cane, but acknowledges that he needs it. Brian counseled patient on the importance of always using his cane.  Reports that he is no longer taking amitriptyline. [name] reports that this medication had caused him to have hallucinations. Patient reports that he is not having trouble sleeping without it.  Discussed Cosopt and lantanoprost eye drops with the patient, showing him the two bottles. Patient reports that he uses 1 drop of the lantanoprost in each eye in the morning and uses 1 drop of the Cosopt in each eye before bedtime. Counseled patient on the importance of taking Cosopt twice daily and taking lantanoprost in the evening. Patient verbalized understanding. Applied labels to each bottle to help patient remember this dosing and also reviewed this dosing with Mrs. [name]. Mrs. [name] says that she will help him to remember.  [name] reports that he has pain in his legs. Reports that he is having imaging done on his right leg this week. Patient uses fentanyl patchs, as well as tramadol once Brian day in the morning, to control his pain. Patient and wife use Brian calendar with dates circled every three days to help them remember to change this patch.  Patient has three pill bottles; for tramadol, hydrochlorothiazide and amlodipine; sitting on Brian shelf in the living room next to the calendar. Patient reports that he takes one of each  of these himself each morning. As patient has trouble with his memory, spoke with the patient about receiving blister packaged medication cards from [name], with [name], to help him take his medications as directed. Patient expressed interest, so I called and spoke with [name], Chartered certified accountant at [name]. Unfortunately, as patient has [name], [name] is Brian non-preferred pharmacy for the patient. Instead discussed with the patient using Brian pillbox to help him to remember to take his medications. Patient and wife have tried this in the past. However, report that this led to conflict as patient wanted to handle his own medication. Discussed in detail with patient the benefit of using the box with his wife's help. Patient and wife agree to try using the pillbox again. [name] brings out the pillbox that they have used and I fill this with the patient. Explain to the patient that he and his wife together will fill this each weekend. Patient verbalizes understanding. He continues to state that he thinks it was fine how he was doing it, but agrees to give the pillbox another try.  When I asked about other medications or over the counter items in the home, [name] brings Brian Mail Order prescription bottle of Cialis 20 mg from the patient's bedroom. The prescription is from over Brian year ago, for Brian quantity of #30 and appears to be fairly full. When I show the bottle to [name], he first has this confused with his Uribel. After I tell the patient what medication this is, he reports that he no  longer uses it. Reports that he used it when he was younger, but does not take it now. I offer to dispose of this medication for the patient, but the patient refuses, stating that he can do this himself.  Objective:   Current Medications: Current Outpatient Prescriptions  Medication Sig Dispense Refill  . amLODipine (NORVASC) 2.5 MG tablet Take 1 tablet (2.5 mg total) by mouth daily. 30 tablet 3   . diclofenac sodium (VOLTAREN) 1 % GEL Apply 1 application topically 3 (three) times daily.    . dorzolamide-timolol (COSOPT) 22.3-6.8 MG/ML ophthalmic solution Place 1 drop into both eyes 2 (two) times daily.     . fentaNYL (DURAGESIC - DOSED MCG/HR) 25 MCG/HR patch Place 1 patch (25 mcg total) onto the skin every 3 (three) days. 10 patch 0  . hydrochlorothiazide (HYDRODIURIL) 25 MG tablet Half tablet every morning 15 tablet 3  . latanoprost (XALATAN) 0.005 % ophthalmic solution Place 1 drop into both eyes at bedtime.     Marland Kitchen loperamide (LOPERAMIDE Brian-D) 2 MG tablet Take 2 mg by mouth 4 (four) times daily as needed for diarrhea or loose stools.    . Meth-Hyo-M Bl-Na Phos-Ph Sal (URIBEL) 118 MG CAPS Take 118 mg by mouth 3 (three) times daily as needed (bladder spasms).     . traMADol (ULTRAM) 50 MG tablet Take 1 tablet (50 mg total) by mouth daily as needed for moderate pain. 30 tablet 2  . amitriptyline (ELAVIL) 10 MG tablet Take 10 mg by mouth at bedtime.     No current facility-administered medications for this visit.    Functional Status: In your present state of health, do you have any difficulty performing the following activities: 02/25/2015 02/13/2015  Hearing? [name]  Vision? Y Y  Difficulty concentrating or making decisions? [name]  Walking or climbing stairs? Y N  Dressing or bathing? Y Y  Doing errands, shopping? [name]  Preparing Food and eating ? - Y  Using the Toilet? - N  In the past six months, have you accidently leaked urine? - Y  Do you have problems with loss of bowel control? - N  Managing your Medications? - Y  Managing your Finances? - Y  Housekeeping or managing your Housekeeping? - Y    Fall/Depression Screening: PHQ 2/9 Scores 04/11/2015 01/14/2015 01/14/2015 04/25/2013  PHQ - 2 Score 0 0 1 0    Assessment:  Patient non-compliant with his eye drops and oral medications. Patient has limited memory due dementia. Patient resistant to having his wife help him manage his  medications.  Plan:  Patient to take tramadol, amlodipine and hydrochlorothiazide tablet via his pillbox daily, with the help of his wife.  Patient, with the assistance of his wife, to remember to use his Cosopt eye drops twice daily and his latanoprost eye drops at bedtime.  I will follow up with Mr and Mrs. Dascenzo via phone on Friday to see how the use of the pillbox is going. If patient is using the pillbox, will discuss the use of Brian clock that displays the day of the week to help patient remember what day of the week it is moving forward  [name], [place] [facility] [phone]

## 2015-04-22 ENCOUNTER — Other Ambulatory Visit: Payer: Self-pay | Admitting: *Deleted

## 2015-04-22 NOTE — Patient Outreach (Signed)
Chart opened in error

## 2015-04-23 ENCOUNTER — Telehealth: Payer: Self-pay | Admitting: *Deleted

## 2015-04-23 NOTE — Telephone Encounter (Signed)
Pt called requesting his pain patches pt said he will pick them up tomorrow.

## 2015-04-23 NOTE — Telephone Encounter (Signed)
They are ready.

## 2015-04-24 ENCOUNTER — Other Ambulatory Visit (HOSPITAL_COMMUNITY): Payer: Self-pay | Admitting: Orthopaedic Surgery

## 2015-04-24 ENCOUNTER — Ambulatory Visit (HOSPITAL_COMMUNITY)
Admission: RE | Admit: 2015-04-24 | Discharge: 2015-04-24 | Disposition: A | Discharge status: Home / Self Care | Payer: Medicare Other | Source: Ambulatory Visit | Attending: Orthopaedic Surgery | Admitting: Orthopaedic Surgery

## 2015-04-24 DIAGNOSIS — M25562 Pain in left knee: Secondary | ICD-10-CM

## 2015-04-24 DIAGNOSIS — X58XXXA Exposure to other specified factors, initial encounter: Secondary | ICD-10-CM | POA: Diagnosis not present

## 2015-04-24 DIAGNOSIS — S83282A Other tear of lateral meniscus, current injury, left knee, initial encounter: Secondary | ICD-10-CM | POA: Diagnosis not present

## 2015-04-24 DIAGNOSIS — M25561 Pain in right knee: Secondary | ICD-10-CM | POA: Diagnosis not present

## 2015-04-24 DIAGNOSIS — M7122 Synovial cyst of popliteal space [Baker], left knee: Secondary | ICD-10-CM | POA: Insufficient documentation

## 2015-04-24 MED ORDER — LIDOCAINE HCL (PF) 1 % IJ SOLN
INTRAMUSCULAR | Status: AC
  Filled 2015-04-24: qty 5

## 2015-04-24 MED ORDER — LIDOCAINE HCL (PF) 1 % IJ SOLN
INTRAMUSCULAR | Status: AC
Start: 2015-04-24 — End: 2015-04-24
  Filled 2015-04-24: qty 5

## 2015-04-24 MED ORDER — IOHEXOL 300 MG/ML  SOLN
50.0000 mL | Freq: Once | INTRAMUSCULAR | Status: AC | PRN
  Administered 2015-04-24: 20 mL via INTRA_ARTICULAR

## 2015-04-24 MED ORDER — POVIDONE-IODINE 10 % EX SOLN
CUTANEOUS | Status: AC
  Filled 2015-04-24: qty 15

## 2015-04-28 ENCOUNTER — Other Ambulatory Visit: Payer: Self-pay | Admitting: Pharmacist

## 2015-04-28 DIAGNOSIS — M25562 Pain in left knee: Secondary | ICD-10-CM | POA: Diagnosis not present

## 2015-04-28 DIAGNOSIS — M25561 Pain in right knee: Secondary | ICD-10-CM | POA: Diagnosis not present

## 2015-04-28 DIAGNOSIS — Z8679 Personal history of other diseases of the circulatory system: Secondary | ICD-10-CM | POA: Diagnosis not present

## 2015-04-28 NOTE — Patient Outreach (Signed)
Lawson Mahone was referred to pharmacy for assistance with medication adherence. Calling to follow up with Mr and Mrs. Krakow to see how the use of the pillbox is going. Left a HIPAA compliant message on the patient's voicemail. If have not heard from patient by 04/30/15, will give him another call at that time.  [name], PharmD Clinical Pharmacist [place] [facility] [phone]

## 2015-04-30 ENCOUNTER — Other Ambulatory Visit: Payer: Self-pay | Admitting: Pharmacist

## 2015-04-30 NOTE — Patient Outreach (Signed)
[name] was referred to pharmacy for assistance with medication adherence. Calling to follow up with [name] to see how the use of the pillbox is going, attempt #2. Left a HIPAA compliant message on the patient's voicemail. If have not heard from patient by 05/02/15, will give him another call at that time.  [name], PharmD Clinical Pharmacist [place] [facility] [phone]

## 2015-05-02 ENCOUNTER — Other Ambulatory Visit: Payer: Self-pay | Admitting: Pharmacist

## 2015-05-02 NOTE — Patient Outreach (Signed)
Called to follow up with Mr and Mrs. Schlee about the use of the weekly pillbox to manage his medications. Spoke with Mrs. Pursley who reports that this has been going well. Reports that they are using the pillbox - she fills it each weekend when she fills her own and then gives him his medications each morning. Reports that the patient has not been resistant to this strategy this time. Asked [name] about Mr. Farquhar' use of his Cosopt and latanoprost eye drops. Reports that she has seen him using these in the morning and evening, but has not been following this use closely. Asked that Mrs. Dory help remind the patient that his is to use his Cosopt eye drops twice daily and his latanoprost eye drops at bedtime. Mrs. Dilauro states that she will help him to do this.  Mrs. Vancott reports that they have no further medication questions at this time. Provide Mrs. Binegar with my phone number again. Let Mrs. Mayeux know that I will follow up with her and Mr. Jafri again on 05/16/15.  [name], PharmD Clinical Pharmacist [place] [facility] [phone]

## 2015-05-05 ENCOUNTER — Encounter: Payer: Self-pay | Admitting: Internal Medicine

## 2015-05-05 ENCOUNTER — Ambulatory Visit (INDEPENDENT_AMBULATORY_CARE_PROVIDER_SITE_OTHER): Payer: Medicare Other | Admitting: Internal Medicine

## 2015-05-05 VITALS — BP 144/96 | HR 91 | Ht 71.0 in | Wt 153.2 lb

## 2015-05-05 DIAGNOSIS — I4819 Other persistent atrial fibrillation: Secondary | ICD-10-CM

## 2015-05-05 DIAGNOSIS — I5022 Chronic systolic (congestive) heart failure: Secondary | ICD-10-CM

## 2015-05-05 DIAGNOSIS — I481 Persistent atrial fibrillation: Secondary | ICD-10-CM | POA: Diagnosis not present

## 2015-05-05 DIAGNOSIS — Z950 Presence of cardiac pacemaker: Secondary | ICD-10-CM

## 2015-05-05 DIAGNOSIS — I10 Essential (primary) hypertension: Secondary | ICD-10-CM | POA: Diagnosis not present

## 2015-05-05 LAB — CUP PACEART INCLINIC DEVICE CHECK
Brady Statistic AS VP Percent: 41.75 %
Brady Statistic RA Percent Paced: 56.94 %
Brady Statistic RV Percent Paced: 98.65 %
Lead Channel Impedance Value: 342 Ohm
Lead Channel Impedance Value: 437 Ohm
Lead Channel Impedance Value: 456 Ohm
Lead Channel Impedance Value: 551 Ohm
Lead Channel Pacing Threshold Amplitude: 1.25 V
Lead Channel Sensing Intrinsic Amplitude: 0.25 mV
Lead Channel Sensing Intrinsic Amplitude: 23.5 mV
Lead Channel Setting Pacing Amplitude: 2.5 V
Lead Channel Setting Pacing Amplitude: 2.5 V
Lead Channel Setting Pacing Pulse Width: 0.4 ms
Lead Channel Setting Pacing Pulse Width: 0.4 ms
Lead Channel Setting Sensing Sensitivity: 5.6 mV
MDC IDC MSMT BATTERY REMAINING LONGEVITY: 30 mo
MDC IDC MSMT BATTERY VOLTAGE: 2.98 V
MDC IDC MSMT LEADCHNL LV IMPEDANCE VALUE: 200 Ohm
MDC IDC MSMT LEADCHNL LV IMPEDANCE VALUE: 437 Ohm
MDC IDC MSMT LEADCHNL LV IMPEDANCE VALUE: 513 Ohm
MDC IDC MSMT LEADCHNL LV PACING THRESHOLD AMPLITUDE: 1 V
MDC IDC MSMT LEADCHNL LV PACING THRESHOLD PULSEWIDTH: 0.4 ms
MDC IDC MSMT LEADCHNL RA IMPEDANCE VALUE: 342 Ohm
MDC IDC MSMT LEADCHNL RA SENSING INTR AMPL: 0.375 mV
MDC IDC MSMT LEADCHNL RV IMPEDANCE VALUE: 323 Ohm
MDC IDC MSMT LEADCHNL RV PACING THRESHOLD PULSEWIDTH: 0.4 ms
MDC IDC MSMT LEADCHNL RV SENSING INTR AMPL: 23.5 mV
MDC IDC SESS DTM: 20160801152649
MDC IDC SET LEADCHNL RA PACING AMPLITUDE: 5 V
MDC IDC SET ZONE DETECTION INTERVAL: 400 ms
MDC IDC STAT BRADY AP VP PERCENT: 56.9 %
MDC IDC STAT BRADY AP VS PERCENT: 0.04 %
MDC IDC STAT BRADY AS VS PERCENT: 1.31 %
Zone Setting Detection Interval: 400 ms

## 2015-05-05 NOTE — Assessment & Plan Note (Signed)
His symptoms are class 2B. He is intolerant of beta blockers (fatigue) and ACE/ARB (renal insuff). He is encouraged to reduce his sodium intake. Will follow. He appears euvolemic today and his optivol confirms this.

## 2015-05-05 NOTE — Assessment & Plan Note (Signed)
He is in and out of NSR, mostly in rhythm. He unfortunately cannot take any anti-coagulation due to GI bleeding. He does not feel palpitations.

## 2015-05-05 NOTE — Patient Instructions (Signed)
Your physician wants you to follow-up in: 12 months with Dr. Lovena Le. You will receive a reminder letter in the mail two months in advance. If you don't receive a letter, please call our office to schedule the follow-up appointment.  Remote monitoring is used to monitor your Pacemaker of ICD from home. This monitoring reduces the number of office visits required to check your device to one time per year. It allows [place] to keep an eye on the functioning of your device to ensure it is working properly. You are scheduled for a device check from home on 10/31 You may send your transmission at any time that day. If you have a wireless device, the transmission will be sent automatically. After your physician reviews your transmission, you will receive a postcard with your next transmission date.  Your physician recommends that you continue on your current medications as directed. Please refer to the Current Medication list given to you today.  Thank you for choosing [facility]!

## 2015-05-05 NOTE — Progress Notes (Signed)
HPI [name] returns today for followup. He is a pleasant elderly man with chronic systolic CHF, non-ischemic CM, HTN and arthritis. He is s/p BiV PPM insertion. When I saw him last, his heart failure had worsened and he had developed worsening atrial fib. He was placed on amio, xarelto and lasix. He developed a GI bleed on his blood thinner and could not tolerate the lasix. He stopped both. No other complaints. In the interim, he has undergone change out of his BiV PPM. He notes worsening CHF symptoms. He has also been intolerant of beta blockers. He is angry that he cannot drive. Allergies  Allergen Reactions  . Codeine Diarrhea  . Penicillins     Other reaction(s): OTHER     Current Outpatient Prescriptions  Medication Sig Dispense Refill  . amLODipine (NORVASC) 2.5 MG tablet Take 1 tablet (2.5 mg total) by mouth daily. 30 tablet 3  . diclofenac sodium (VOLTAREN) 1 % GEL Apply 1 application topically 3 (three) times daily.    . dorzolamide-timolol (COSOPT) 22.3-6.8 MG/ML ophthalmic solution Place 1 drop into both eyes 2 (two) times daily.     . fentaNYL (DURAGESIC - DOSED MCG/HR) 25 MCG/HR patch Place 1 patch (25 mcg total) onto the skin every 3 (three) days. 10 patch 0  . hydrochlorothiazide (HYDRODIURIL) 25 MG tablet Half tablet every morning 15 tablet 3  . latanoprost (XALATAN) 0.005 % ophthalmic solution Place 1 drop into both eyes at bedtime.     Marland Kitchen loperamide (LOPERAMIDE A-D) 2 MG tablet Take 2 mg by mouth 4 (four) times daily as needed for diarrhea or loose stools.    . Meth-Hyo-M Bl-Na Phos-Ph Sal (URIBEL) 118 MG CAPS Take 118 mg by mouth 3 (three) times daily as needed (bladder spasms).     . traMADol (ULTRAM) 50 MG tablet Take 1 tablet (50 mg total) by mouth daily as needed for moderate pain. 30 tablet 2   No current facility-administered medications for this visit.     Past Medical History  Diagnosis Date  . Chronic systolic heart failure   . Rash and other nonspecific skin  eruption   . Costochondritis, acute   . Glaucoma   . Erectile dysfunction   . Visual changes   . Pancytopenia   . Chronic pancreatitis     Based on CT findings  . History of melanoma in situ     JAN 2012--  LEFT HEEL W/ SLN BX  . Chronic back pain   . IC (interstitial cystitis)   . BPH (benign prostatic hypertrophy)   . Cardiac pacemaker     CARDIOLOGIST-- DR Cristopher Peru ([facility] 05-15-2013)  . Asymptomatic carotid artery stenosis     BILATERAL MILD ICA---  <50% PER DUPLEX 03-08-2008  . History of syncope   . Hypertension   . CHB (complete heart block)   . Nonischemic cardiomyopathy   . Severe mitral regurgitation   . Urge urinary incontinence   . A-fib   . Dementia   . Alzheimer disease     ROS:   All systems reviewed and negative except as noted in the HPI.   Past Surgical History  Procedure Laterality Date  . Cataract extraction, bilateral  left  1997/   right 2003  . Pacemaker insertion  12-06-2008  DR GREGG TAYLOR    BiV PPM  --  [employer]  . Colonoscopy  08/18/2006    NAT:FTDD granularity and erosions of the rectum of uncertain significance biopsied.  A long redundant,  but otherwise normal-appearing colon.melanosi coli and minimal inflammation on bx  . Inguinal hernia repair Bilateral AS TEEN  . Interstim implant placement  2001  &  2007  . Cysto/ hod/  replacement interstim implant  05-14-2003  . Removal interstim implant/  cyst/  hod  04-14-2004  . Revision interstim implant and replace generator  05-15-2009    left upper buttock for urge urinary incontinence  . Wide excision left heel and sln bx  JUNE 2012  . Tonsillectomy  1950  . Left elbow surgery  2002  . Back surgery  X3  LAST ONE'90's  . Transthoracic echocardiogram  12-06-2008    LVSF 55-60%/  MODERATE MR/  MILD AR/  QUESTION DISTAL POSTERIOR HYPOKINESIS  . Interstim implant revision N/A 08/28/2013    Procedure: REPLACEMENT OF INTERSTIM-GENERATOR AND LEAD ;  Surgeon: [name],  MD;  Location: [facility];  Service: Urology;  Laterality: N/A;  . Esophagogastroduodenoscopy N/A 03/28/2014    Dr. [name]: normal esophagus s/p dilation, mosaic appearance - chronic inflammation noted on bx  . Savory dilation N/A 03/28/2014    Procedure: SAVORY DILATION;  Surgeon: [name], MD;  [facility];  Service: Endoscopy;  Laterality: N/A;  Venia Minks dilation N/A 03/28/2014    Procedure: Venia Minks DILATION;  Surgeon: [name], MD;  [facility];  Service: Endoscopy;  Laterality: N/A;  . Colonoscopy N/A 08/05/2014    Procedure: COLONOSCOPY;  Surgeon: [name], MD;  [facility];  Service: Endoscopy;  Laterality: N/A;  PT NEEDS TCS AT 1000.  . Implantable cardioverter defibrillator (icd) generator change N/A 10/11/2014    Procedure: ICD GENERATOR CHANGE;  Surgeon: [name], MD;  Location: [facility] CATH LAB;  Service: Cardiovascular;  Laterality: N/A;     Family History  Problem Relation Age of Onset  . Dementia Mother   . Throat cancer Father   . Cancer Father 86    throat  . Lung cancer Sister   . Heart disease Brother      History   Social History  . Marital Status: Married    Spouse Name: N/A  . Number of Children: N/A  . Years of Education: N/A   Occupational History  . retired     Social History Main Topics  . Smoking status: Former Smoker -- 0.02 packs/day    Types: Cigarettes    Quit date: 08/24/1997  . Smokeless tobacco: Never Used  . Alcohol Use: No  . Drug Use: No  . Sexual Activity:    Partners: Male   Other Topics Concern  . Not on file   Social History Narrative     BP 144/96 mmHg  Pulse 91  Ht 5\' 11"  (1.803 m)  Wt 153 lb 3.2 oz (69.491 kg)  BMI 21.38 kg/m2  SpO2 96%  Physical Exam:  Well appearing elderly man, NAD HEENT: Unremarkable Neck:  7 cm JVD, no thyromegally Back:  No CVA tenderness Lungs:  Clear with no wheezes HEART:  Regular rate rhythm, grade 2/6 systolic  murmur, no rubs, no clicks Abd:  soft, positive bowel sounds, no organomegally, no rebound, no guarding Ext:  2 plus pulses, no edema, no cyanosis, no clubbing Skin:  No rashes no nodules Neuro:  CN II through XII intact, motor grossly intact   DEVICE  Normal device function.  See PaceArt for details.   Assess/Plan:

## 2015-05-05 NOTE — Assessment & Plan Note (Signed)
His blood pressure is elevated. I have encouraged the patient to reduce his sodium intake. Isordil/hydralazine would be a consideration.

## 2015-05-05 NOTE — Assessment & Plan Note (Signed)
His medtronic BiV PPM is working normally. Will recheck in several months.  

## 2015-05-09 ENCOUNTER — Telehealth: Payer: Self-pay

## 2015-05-09 DIAGNOSIS — I10 Essential (primary) hypertension: Secondary | ICD-10-CM

## 2015-05-09 DIAGNOSIS — D649 Anemia, unspecified: Secondary | ICD-10-CM

## 2015-05-09 NOTE — Telephone Encounter (Signed)
Patient aware and will come and have labs done Monday

## 2015-05-09 NOTE — Addendum Note (Signed)
Addended by: Denman George B on: 05/09/2015 02:55 PM   Modules accepted: Orders

## 2015-05-09 NOTE — Telephone Encounter (Signed)
plsorder cbc, chem 7 and tSH to be done next week, and remind him to dress warmly if cold

## 2015-05-12 ENCOUNTER — Ambulatory Visit: Payer: Self-pay | Admitting: *Deleted

## 2015-05-12 ENCOUNTER — Other Ambulatory Visit: Payer: Self-pay | Admitting: *Deleted

## 2015-05-12 DIAGNOSIS — I10 Essential (primary) hypertension: Secondary | ICD-10-CM | POA: Diagnosis not present

## 2015-05-12 DIAGNOSIS — D649 Anemia, unspecified: Secondary | ICD-10-CM | POA: Diagnosis not present

## 2015-05-12 NOTE — Patient Outreach (Signed)
[facility]) Care Management  05/12/2015  [name] Jan 06, 1926 [id number]   Phone call to patient and spouse to follow up on personal care services arranged.  HIPPA compliant voicemail message left for a return call.    [name] [facility] Care Management [phone]

## 2015-05-12 NOTE — Patient Outreach (Signed)
[facility]) Care Management  05/12/2015  [name] 1926-09-30 [id number]  Follow up phone call regarding the start of personal care services.  Phone line busy.  CSW to attempt another call later today.    [name] [facility] Care Management [phone]

## 2015-05-13 ENCOUNTER — Telehealth: Payer: Self-pay | Admitting: Family Medicine

## 2015-05-13 DIAGNOSIS — D61818 Other pancytopenia: Secondary | ICD-10-CM

## 2015-05-13 LAB — BASIC METABOLIC PANEL
BUN: 17 mg/dL (ref 7–25)
CALCIUM: 8.7 mg/dL (ref 8.6–10.3)
CHLORIDE: 104 mmol/L (ref 98–110)
CO2: 27 mmol/L (ref 20–31)
Creat: 1.07 mg/dL (ref 0.70–1.11)
Glucose, Bld: 108 mg/dL — ABNORMAL HIGH (ref 65–99)
POTASSIUM: 3.5 mmol/L (ref 3.5–5.3)
Sodium: 140 mmol/L (ref 135–146)

## 2015-05-13 LAB — CBC
HCT: 32.2 % — ABNORMAL LOW (ref 39.0–52.0)
HEMOGLOBIN: 11 g/dL — AB (ref 13.0–17.0)
MCH: 32.1 pg (ref 26.0–34.0)
MCHC: 34.2 g/dL (ref 30.0–36.0)
MCV: 93.9 fL (ref 78.0–100.0)
MPV: 10.2 fL (ref 8.6–12.4)
Platelets: 97 10*3/uL — ABNORMAL LOW (ref 150–400)
RBC: 3.43 MIL/uL — ABNORMAL LOW (ref 4.22–5.81)
RDW: 14.4 % (ref 11.5–15.5)
WBC: 2.3 10*3/uL — ABNORMAL LOW (ref 4.0–10.5)

## 2015-05-13 LAB — TSH: TSH: 3.414 u[IU]/mL (ref 0.350–4.500)

## 2015-05-13 NOTE — Telephone Encounter (Signed)
[name] is asking for lab results from 05/12/15, please advise?

## 2015-05-14 ENCOUNTER — Other Ambulatory Visit: Payer: Self-pay | Admitting: *Deleted

## 2015-05-14 NOTE — Patient Outreach (Signed)
[name]   05/14/2015  [name] [birth year] [id number]  [name] is an 81 y.o. male  Subjective: "I had some labs done because I am so weak and tired. I feel cold all the time even when it's hot outside." Spouse states," Moody sleeps all the time and has been really constipated lately."   Objective: Blood pressure 120/60, pulse 60, resp. rate 18, weight 151 lb (68.493 kg), SpO2 92 % on room air. Review of Systems  Constitutional: Positive for weight loss and malaise/fatigue.  Eyes: Positive for blurred vision.  Respiratory: Positive for shortness of breath.   Gastrointestinal: Positive for constipation.  Musculoskeletal: Positive for joint pain.  Neurological: Positive for weakness.  Psychiatric/Behavioral: Positive for memory loss.    Physical Exam  Constitutional: He is oriented to person, place, and time. He appears lethargic. He is cooperative.  Pt noted to look thin compared to previous visits Dozing off at times during the visit.   HENT:  Head:    Cardiovascular: Normal rate and regular rhythm.   Murmur heard. Pulses:      Dorsalis pedis pulses are 2+ on the right side, and 2+ on the left side.  Respiratory: Effort normal and breath sounds normal.  Pt reports getting easily "winded" with any exercise  GI: Soft. Bowel sounds are normal.  Pt and spouse report pt struggling with constipation  Musculoskeletal: He exhibits tenderness.       Right lower leg: He exhibits no edema.       Left lower leg: He exhibits no edema.       Right foot: There is swelling.       Left foot: There is swelling.  Bilateral feet with slight swelling  Neurological: He is oriented to person, place, and time. He appears lethargic.  Skin: Skin is warm and dry.     Psychiatric: He has a normal mood and affect. His speech is normal. He exhibits abnormal recent memory.  Pt noted to repeat himself several times asking the same questions over after the  answer was given.     Current Medications:     Medications Reviewed Today    Reviewed by Gurney Maxin, RN (Registered Nurse) on 05/14/15 at 20  Med List Status: <None>   Medication Order Taking? Sig Documenting Provider Last Dose Status Informant   amLODipine (NORVASC) 2.5 MG tablet [id number] Yes Take 1 tablet (2.5 mg total) by mouth daily. Fayrene Helper, MD Taking Active    diclofenac sodium (VOLTAREN) 1 % GEL [id number] Yes Apply 1 application topically 3 (three) times daily. Historical Provider, MD Taking Active              Med Note (Zakeria Kulzer, Dalbert Garnet   Fri Apr 11, 2015 10:23 AM): Pt stating he uses this medication for gout, but tube unopened.    dorzolamide-timolol (COSOPT) 22.3-6.8 MG/ML ophthalmic solution [id number] Yes Place 1 drop into both eyes 2 (two) times daily.  Historical Provider, MD Taking Active Self             Med Note Belva Agee, Isabella Bowens Feb 13, 2015  1:34 PM): Patient states he is taking this medication.    fentaNYL (DURAGESIC - DOSED MCG/HR) 25 MCG/HR patch [id number] Yes Place 1 patch (25 mcg total) onto the skin every 3 (three) days. Fayrene Helper, MD Taking Active              Med Note ([place], [facility] Jerilynn Mages  Tue Apr 01, 2015  9:29 AM): Patient's wife assist patient with applying patch every 3 days.    hydrochlorothiazide (HYDRODIURIL) 25 MG tablet [id number] Yes Half tablet every morning Fayrene Helper, MD Taking Active    latanoprost (XALATAN) 0.005 % ophthalmic solution [id number] Yes Place 1 drop into both eyes at bedtime.  Historical Provider, MD Taking Active Self   loperamide (LOPERAMIDE A-D) 2 MG tablet [id number] No Take 2 mg by mouth 4 (four) times daily as needed for diarrhea or loose stools. Historical Provider, MD Not Taking Active              Med Note (Obrien Huskins, Dalbert Garnet   Fri Apr 11, 2015 10:25 AM): Pt states recently started taking for "softer stool"    Meth-Hyo-M Bl-Na Phos-Ph Sal (URIBEL) 118 MG CAPS [id number] Yes Take 118 mg by mouth 3 (three)  times daily as needed (bladder spasms).  Historical Provider, MD Taking Active Self   traMADol (ULTRAM) 50 MG tablet [id number] Yes Take 1 tablet (50 mg total) by mouth daily as needed for moderate pain. Fayrene Helper, MD Taking Active              Med Note [facility]   Mon Apr 21, 2015  2:58 PM): Taking 1 tablet once daily in the morning           Assessment: See physical assessment as above.  General: Pt had just gotten up and looked tired. Pt also looked thinner than at previous visit. At PCP visit in May weight 157 stated weight today 151. Spouse stated he continues to drink Boost daily. RNCM able to provide a case of Breeze nutritional drinks this visit. Pt c/o feeling really cold, weak, tired and not being able to walk far without giving out and being "winded". Pt concerned about recent lab work results. RNCM talked with Loma Sousa LPN for Dr. Moshe Cipro related to lab results, pt's complaints and pt's recent constipation. Miralax was ordered and sent to pt's pharmacy. A consult for Hematology was ordered by Dr.Simpson and recommendations for pt to start an OTC iron supplement 325mg  q day given. RNCM talked with pt and spouse about lab results and MD recommendations. Note written plainly for spouse related to all instructions given by the MD office. RNCM also gave spouse a list of iron rich foods to add to pt's diet.   HF: Bilat lungs clear. Bilat feet slightly swollen. Neither hands nor abd with swelling noted. Poss for SOB with exertion. Poss for fatigue. Denies nausea or dry hacking cough.   Med Adherence: Spouse reports pt compliant with allowing her to fill pill box and RNCM observed pt's pills and breakfast neatly set out for him at the table. Pt's b/p WNL.   Caregiver Assistance: Spouse reports CNA [name] coming into their home helping regularly. Also reports pt taking medications willingly has caused her to have less stress. Spouse states they have been doing well for the  last month. Spouse also stated the company that [name] works for may possibly be able to provide increased caregiver support though [facility]. RNCM passed this information along to [facility] for collaboration. RNCM discussed with pt and spouse possible d/c from care-[name] services at next visit.     Plan: RNCM will print and mail EMMI information related to anemia and iron rich diet.  RNCM will send this encounter to MD for review. RNCM will see pt in one month with possible plans to d/c.  [name] RN, BSN  [facility] Care [name] [phone])

## 2015-05-14 NOTE — Telephone Encounter (Signed)
Referral entered, please let him know

## 2015-05-14 NOTE — Patient Outreach (Signed)
[facility]) Care Management  05/14/2015  [name] 03-Jul-1926 [id number]    Care coordination phone call to patient's spouse to follow up on personal care services being in place for patient.  Per patient's spouse, family friend [name] comes to provide personal care assistance and to help with household needs oly when she calls.  At this time, [name] does not have a set schedule.  Per patient's spouse, [name] arranged for [name] nurse [name] from [facility]  to come and speak with them about their services. Particularly daily in home aid services.  Per patient's spouse, she is considering this option.  Phone call made to [name] from [facility] who states that he received an order from patient's provider's office- [name]  on Monday to evaluate patient for Hospice Services.  Per [name] he  will schedule for patient to have a nursing assessment to determined if patient is eligible for Hospice services by the end of the week.  Plan:This Education officer, museum to follow up with [name] following patient's nursing assessment for eligibility for Hospice services.   [name] [facility] Care Management [phone]

## 2015-05-14 NOTE — Telephone Encounter (Signed)
Patient aware of results.  He is asking to be referred to hematology.  Please advise.

## 2015-05-16 ENCOUNTER — Other Ambulatory Visit: Payer: Self-pay

## 2015-05-16 ENCOUNTER — Other Ambulatory Visit: Payer: Self-pay | Admitting: Pharmacist

## 2015-05-16 MED ORDER — POLYETHYLENE GLYCOL 3350 17 GM/SCOOP PO POWD: 17.0000 g | Freq: Every day | ORAL | Status: AC

## 2015-05-16 MED ORDER — FENTANYL 25 MCG/HR TD PT72: 25.0000 ug | MEDICATED_PATCH | TRANSDERMAL | Status: DC

## 2015-05-16 NOTE — Patient Outreach (Signed)
Called to follow up with Mr and Mrs. Slomski about medication adherence and the new iron and Miralax that patient was started on this week per Nurse Care Manager Janci Minor's note on 05/14/15.  Spoke with Mrs. Ciulla who reports that the patient is still doing very well with taking his medications using the pillbox, allowing her to manage this for him. Reports that they picked up the over the counter iron, ferrous sulfate 325 mg tablets on Wednesday and he has been taking 1 tablet each morning with his other medications. Reports that she will be refilling the pillbox today and will be adding the iron to this.   Reports that they called the pharmacy this morning about picking up the medication for his constipation, but the pharmacy did not have it. Reports that despite not having this, the patient's bowel movements have improved. Per the 05/14/15 note of Nurse Care Manager Janci Minor, Courtney LPN for Dr. Moshe Cipro stated that the Miralax was ordered and sent to pt's pharmacy. Let Ms. Palencia know that I will call and follow up with Dr. Griffin Dakin office now about this medication and then call her back.  [name], PharmD Clinical Pharmacist [place] Management [phone]

## 2015-05-16 NOTE — Patient Outreach (Signed)
Called to follow up with Loma Sousa at Dr. Griffin Dakin office about the Miralax prescription. Spoke with receptionist and left a message for [place]. However, the office is now closed. Receptionist states that according to their records, the Miralax has not yet been called in. States that she will have Loma Sousa call me and the patient back. Will call and follow up with Mrs. Cilia now.  [name], PharmD Clinical Pharmacist [place] Management [phone]

## 2015-05-16 NOTE — Patient Outreach (Signed)
Called to follow up with Mrs. Potocki to let her know that I called Courtney at Dr. Griffin Dakin office about the Miralax prescription. Spoke with receptionist and left a message for [place]. However, the office is now closed. Receptionist states that according to their records, the Miralax has not yet been called in. States that she will have Loma Sousa call me and the patient back.   Patient reports that she has not heard from [facility], but that [facility] just called her to let her know that the Miralax was called in for Mr. Rolfe. Reminded patient's wife to use this as needed for the patient's constipation.  Mr. Ottley has his appointment with hematology on 05/27/15. Let Mrs. Catlin know that I will follow up with them on 05/28/15.  [name], PharmD Clinical Pharmacist [place] Management [phone]

## 2015-05-19 ENCOUNTER — Other Ambulatory Visit: Payer: Self-pay | Admitting: *Deleted

## 2015-05-19 NOTE — Patient Outreach (Signed)
[facility]) Care Management  05/19/2015  [name] [birth year] [id number]  Phone call to Beryle Beams, nurse liaison with [facility] and [facility]ospice to follow up on completion of patient's nursing assessment for Hospice eligibility.  Per Georgina Snell, assessment has not been done yet, however shoulf be completed this week.  He will call this social worker once assessment is completed.  RNCM to be updated regarding assessment once completed as well.   [name] [facility] Care Management [phone]'

## 2015-05-21 NOTE — Telephone Encounter (Signed)
Will address at next ov

## 2015-05-23 ENCOUNTER — Ambulatory Visit: Payer: Self-pay | Admitting: Urology

## 2015-05-27 ENCOUNTER — Other Ambulatory Visit (HOSPITAL_COMMUNITY): Admission: RE | Admit: 2015-05-27 | Payer: Medicare Other | Source: Ambulatory Visit | Admitting: Hematology & Oncology

## 2015-05-27 ENCOUNTER — Encounter (HOSPITAL_COMMUNITY): Payer: Medicare Other | Attending: Hematology & Oncology | Admitting: Hematology & Oncology

## 2015-05-27 ENCOUNTER — Encounter (HOSPITAL_COMMUNITY): Payer: Self-pay | Admitting: Hematology & Oncology

## 2015-05-27 VITALS — BP 122/77 | HR 60 | Temp 97.7°F | Resp 16 | Ht 69.0 in | Wt 156.3 lb

## 2015-05-27 DIAGNOSIS — D61818 Other pancytopenia: Secondary | ICD-10-CM

## 2015-05-27 DIAGNOSIS — R5383 Other fatigue: Secondary | ICD-10-CM | POA: Diagnosis not present

## 2015-05-27 DIAGNOSIS — D649 Anemia, unspecified: Secondary | ICD-10-CM | POA: Diagnosis not present

## 2015-05-27 DIAGNOSIS — D7289 Other specified disorders of white blood cells: Secondary | ICD-10-CM | POA: Diagnosis not present

## 2015-05-27 LAB — CBC WITH DIFFERENTIAL/PLATELET
BASOS ABS: 0 10*3/uL (ref 0.0–0.1)
Basophils Relative: 1 % (ref 0–1)
EOS ABS: 0.1 10*3/uL (ref 0.0–0.7)
Eosinophils Relative: 4 % (ref 0–5)
HCT: 31.5 % — ABNORMAL LOW (ref 39.0–52.0)
Hemoglobin: 10.7 g/dL — ABNORMAL LOW (ref 13.0–17.0)
Lymphocytes Relative: 20 % (ref 12–46)
Lymphs Abs: 0.4 10*3/uL — ABNORMAL LOW (ref 0.7–4.0)
MCH: 32.5 pg (ref 26.0–34.0)
MCHC: 34 g/dL (ref 30.0–36.0)
MCV: 95.7 fL (ref 78.0–100.0)
MONO ABS: 0.1 10*3/uL (ref 0.1–1.0)
Monocytes Relative: 8 % (ref 3–12)
NEUTROS ABS: 1.2 10*3/uL — AB (ref 1.7–7.7)
Neutrophils Relative %: 67 % (ref 43–77)
Platelets: 82 10*3/uL — ABNORMAL LOW (ref 150–400)
RBC: 3.29 MIL/uL — AB (ref 4.22–5.81)
RDW: 14.1 % (ref 11.5–15.5)
WBC: 1.8 10*3/uL — AB (ref 4.0–10.5)

## 2015-05-27 LAB — IRON AND TIBC
IRON: 61 ug/dL (ref 45–182)
Saturation Ratios: 17 % — ABNORMAL LOW (ref 17.9–39.5)
TIBC: 364 ug/dL (ref 250–450)
UIBC: 303 ug/dL

## 2015-05-27 LAB — COMPREHENSIVE METABOLIC PANEL
ALBUMIN: 3.6 g/dL (ref 3.5–5.0)
ALT: 16 U/L — ABNORMAL LOW (ref 17–63)
ANION GAP: 6 (ref 5–15)
AST: 24 U/L (ref 15–41)
Alkaline Phosphatase: 52 U/L (ref 38–126)
BUN: 23 mg/dL — ABNORMAL HIGH (ref 6–20)
CO2: 29 mmol/L (ref 22–32)
Calcium: 8.5 mg/dL — ABNORMAL LOW (ref 8.9–10.3)
Chloride: 105 mmol/L (ref 101–111)
Creatinine, Ser: 1.06 mg/dL (ref 0.61–1.24)
GFR calc Af Amer: >60 mL/min (ref >60)
GFR calc non Af Amer: >60 mL/min (ref >60)
Glucose, Bld: 126 mg/dL — ABNORMAL HIGH (ref 65–99)
POTASSIUM: 3.8 mmol/L (ref 3.5–5.1)
Sodium: 140 mmol/L (ref 135–145)
Total Bilirubin: 1.2 mg/dL (ref 0.3–1.2)
Total Protein: 6.4 g/dL — ABNORMAL LOW (ref 6.5–8.1)

## 2015-05-27 LAB — RETICULOCYTES
RBC.: 3.29 MIL/uL — AB (ref 4.22–5.81)
RETIC COUNT ABSOLUTE: 26.3 10*3/uL (ref 19.0–186.0)
Retic Ct Pct: 0.8 % (ref 0.4–3.1)

## 2015-05-27 LAB — C-REACTIVE PROTEIN: CRP: <0.5 mg/dL (ref <1.0)

## 2015-05-27 LAB — VITAMIN B12: VITAMIN B 12: 662 pg/mL (ref 180–914)

## 2015-05-27 LAB — TSH: TSH: 2.875 u[IU]/mL (ref 0.350–4.500)

## 2015-05-27 LAB — LACTATE DEHYDROGENASE: LDH: 269 U/L — ABNORMAL HIGH (ref 98–192)

## 2015-05-27 LAB — SEDIMENTATION RATE: SED RATE: 9 mm/h (ref 0–16)

## 2015-05-27 LAB — FERRITIN: Ferritin: 51 ng/mL (ref 24–336)

## 2015-05-27 LAB — FOLATE: FOLATE: 15.6 ng/mL (ref >5.9)

## 2015-05-27 NOTE — Patient Instructions (Signed)
..  [place] at [facility] Discharge Instructions  RECOMMENDATIONS MADE BY THE CONSULTANT AND ANY TEST RESULTS WILL BE SENT TO YOUR REFERRING PHYSICIAN.   Seen and discussion with Dr. Whitney Muse Call the can center with any questions and/or concerns that you have.   Lab work drawn today.  Follow up appt. In 3 weeks.        Thank you for choosing [place] at [facility] to provide your oncology and hematology care.  To afford each patient quality time with our provider, please arrive at least 15 minutes before your scheduled appointment time.    You need to re-schedule your appointment should you arrive 10 or more minutes late.  We strive to give you quality time with our providers, and arriving late affects you and other patients whose appointments are after yours.  Also, if you no show three or more times for appointments you may be dismissed from the clinic at the providers discretion.     Again, thank you for choosing [facility].  Our hope is that these requests will decrease the amount of time that you wait before being seen by our physicians.       _____________________________________________________________  Should you have questions after your visit to [facility], please contact our office at [phone] between the hours of 8:30 a.m. and 4:30 p.m.  Voicemails left after 4:30 p.m. will not be returned until the following business day.  For prescription refill requests, have your pharmacy contact our office.

## 2015-05-27 NOTE — Progress Notes (Signed)
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 Decreased iron stores were noted. Cytogenetics showed 20% of the nuclei positive by fish with a 22 q deletion. No monosomy 7 is present.  HISTORY OF PRESENTING ILLNESS:  [name] 81 y.o. male is here because of pancytopenia, referred by Dr. Moshe Cipro.  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  The patient has MDS with 22Q deletion diagnosed and evaluated by Dr. Sterling Big at [facility].  Today, he has complaints of having back pains, weak legs, frequent urination, and "dim" vision; he has just received glasses.  He also has Glaucoma in his L eye.  He visits [facility] for issues with his bladder.  His appetite is well but he has experienced some weight loss over time.  The patient denies problems with breathing and had a new pacemaker put in a couple of years ago.  He says that he has constipation occasionally but this is alleviated with medication prescribed from his Primary.  He denies diarrhea or blood in his stool.  He has a home in [place] and his wife's home is in [place].  He says that he spends  95% of his time at his wife's but he would like to spend more time at his home.  He has seen Dr. Jacqlyn Larsen from [facility] for his melanoma and was told at this time that his blood was abdormal.  He has also previously visited Dr. Tressie Stalker but cannot remember exactly why.  His major complaint today are his weak legs. He is no longer able to go like he wants to.  He is referred today by Dr. Moshe Cipro for additional evaluation and management of his pancytopenia.  MEDICAL HISTORY:  Past Medical History  Diagnosis Date  . Chronic systolic heart failure   . Rash and other nonspecific skin eruption   . Costochondritis, acute   . Glaucoma   . Erectile dysfunction   . Visual changes   . Pancytopenia   . Chronic pancreatitis     Based on CT findings  . History of melanoma in situ     JAN 2012--  LEFT HEEL W/ SLN BX  . Chronic back pain   . IC (interstitial cystitis)   . BPH (benign prostatic hypertrophy)   . Cardiac pacemaker     CARDIOLOGIST-- DR Cristopher Peru (LAST PACE [facility] 05-15-2013)  . Asymptomatic carotid artery stenosis     BILATERAL MILD ICA---  <50% PER DUPLEX 03-08-2008  . History of syncope   . Hypertension   . CHB (complete heart block)   . Nonischemic cardiomyopathy   . Severe mitral regurgitation   . Urge urinary incontinence   . A-fib   . Dementia   .  Alzheimer disease     SURGICAL HISTORY: Past Surgical History  Procedure Laterality Date  . Cataract extraction, bilateral  left  1997/   right 2003  . Pacemaker insertion  12-06-2008  DR GREGG TAYLOR    BiV PPM  --  [employer]  . Colonoscopy  08/18/2006    GYJ:EHUD granularity and erosions of the rectum of uncertain significance biopsied.  A long redundant, but otherwise normal-appearing colon.melanosi coli and minimal inflammation on bx  . Inguinal hernia repair Bilateral AS TEEN  . Interstim implant placement  2001  &  2007  . Cysto/ hod/  replacement interstim implant  05-14-2003  . Removal interstim implant/   cyst/  hod  04-14-2004  . Revision interstim implant and replace generator  05-15-2009    left upper buttock for urge urinary incontinence  . Wide excision left heel and sln bx  JUNE 2012  . Tonsillectomy  1950  . Left elbow surgery  2002  . Back surgery  X3  LAST ONE'90's  . Transthoracic echocardiogram  12-06-2008    LVSF 55-60%/  MODERATE MR/  MILD AR/  QUESTION DISTAL POSTERIOR HYPOKINESIS  . Interstim implant revision N/A 08/28/2013    Procedure: REPLACEMENT OF INTERSTIM-GENERATOR AND LEAD ;  Surgeon: [name], MD;  Location: [place];  Service: Urology;  Laterality: N/A;  . Esophagogastroduodenoscopy N/A 03/28/2014    Dr. [name]: normal esophagus s/p dilation, mosaic appearance - chronic inflammation noted on bx  . Savory dilation N/A 03/28/2014    Procedure: SAVORY DILATION;  Surgeon: [name], MD;  [facility];  Service: Endoscopy;  Laterality: N/A;  Venia Minks dilation N/A 03/28/2014    Procedure: Venia Minks DILATION;  Surgeon: [name], MD;  [facility];  Service: Endoscopy;  Laterality: N/A;  . Colonoscopy N/A 08/05/2014    Procedure: COLONOSCOPY;  Surgeon: [name], MD;  [facility];  Service: Endoscopy;  Laterality: N/A;  PT NEEDS TCS AT 1000.  . Implantable cardioverter defibrillator (icd) generator change N/A 10/11/2014    Procedure: ICD GENERATOR CHANGE;  Surgeon: [name], MD;  Location: [facility] CATH LAB;  Service: Cardiovascular;  Laterality: N/A;    SOCIAL HISTORY: Social History   Social History  . Marital Status: Married    Spouse Name: N/A  . Number of Children: N/A  . Years of Education: N/A   Occupational History  . retired     Social History Main Topics  . Smoking status: Former Smoker -- 0.02 packs/day    Types: Cigarettes    Quit date: 08/24/1997  . Smokeless tobacco: Never Used  . Alcohol Use: No  . Drug Use: No  . Sexual Activity:    Partners: Male   Other Topics Concern    . 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 Dementia Mother   . Throat cancer Father   . Cancer Father 86    throat  . Lung cancer Sister   . Heart disease Brother    indicated that his mother is deceased. He indicated that his father is deceased. He indicated that two of his five sisters are alive. He indicated that only one of his two brothers is alive.   ALLERGIES:  is allergic to codeine and penicillins. Father deceased at 69, Head and  Neck Cancer Mother deceased at 28, Dementia 25 brothers and sisters; 3 brothers deceased, 2 sisters deceased    MEDICATIONS:  Current Outpatient Prescriptions  Medication Sig Dispense Refill  . amLODipine (NORVASC) 2.5 MG tablet Take 1 tablet (2.5 mg total) by mouth daily. 30 tablet 3  . diclofenac sodium (VOLTAREN) 1 % GEL Apply 1 application topically 3 (three) times daily.    . dorzolamide-timolol (COSOPT) 22.3-6.8 MG/ML ophthalmic solution Place 1 drop into both eyes 2 (two) times daily.     . fentaNYL (DURAGESIC - DOSED MCG/HR) 25 MCG/HR patch Place 1 patch (25 mcg total) onto the skin every 3 (three) days. 10 patch 0  . hydrochlorothiazide (HYDRODIURIL) 25 MG tablet Half tablet every morning 15 tablet 3  . latanoprost (XALATAN) 0.005 % ophthalmic solution Place 1 drop into both eyes at bedtime.     . Meth-Hyo-M Bl-Na Phos-Ph Sal (URIBEL) 118 MG CAPS Take 118 mg by mouth 3 (three) times daily as needed (bladder spasms).     . polyethylene glycol powder (GLYCOLAX/MIRALAX) powder Take 17 g by mouth daily. 3350 g 1  . traMADol (ULTRAM) 50 MG tablet Take 1 tablet (50 mg total) by mouth daily as needed for moderate pain. 30 tablet 2   No current facility-administered medications for this visit.    Review of Systems  Constitutional: Positive for weight loss and malaise/fatigue.  HENT:  Positive for hearing loss.   Eyes: Positive for blurred vision.       Weakening  Respiratory: Negative.   Cardiovascular: Negative.   Gastrointestinal: Negative.   Genitourinary: Positive for frequency.  Musculoskeletal: Positive for back pain and joint pain.       Weak legs Back pain  Skin: Negative.   Neurological: Positive for weakness. Negative for dizziness, tingling, tremors, sensory change, speech change, focal weakness, seizures and loss of consciousness.  Endo/Heme/Allergies: Negative.   Psychiatric/Behavioral: Negative.   All other systems reviewed and are negative.  14 point ROS was done and is otherwise as detailed above or in HPI  PHYSICAL EXAMINATION: ECOG PERFORMANCE STATUS: 2 - Symptomatic, <50% confined to bed  Filed Vitals:   05/27/15 1400  BP: 122/77  Pulse: 60  Temp: 97.7 F (36.5 C)  Resp: 16   Filed Weights   05/27/15 1400  Weight: 156 lb 4.8 oz (70.897 kg)     Physical Exam  Constitutional: He is oriented to person, place, and time and well-developed, well-nourished, and in no distress. No distress.  HENT:  Head: Normocephalic and atraumatic.  Nose: Nose normal.  Mouth/Throat: Oropharynx is clear and moist. No oropharyngeal exudate.  Eyes: Conjunctivae and EOM are normal. Pupils are equal, round, and reactive to light. Right eye exhibits no discharge. Left eye exhibits no discharge. No scleral icterus.  L eye, lazy  Neck: Normal range of motion. Neck supple. No tracheal deviation present. No thyromegaly present.  Cardiovascular: Normal rate, regular rhythm and normal heart sounds.  Exam reveals no gallop and no friction rub.   No murmur heard. Palpable pacemaker, R chest Systolic ejection murmur More edema in R ankle  Pulmonary/Chest: Effort normal and breath sounds normal. He has no wheezes. He has no rales.  Abdominal: Soft. Bowel sounds are normal. He exhibits no distension and no mass. There is no tenderness. There is no rebound and no  guarding.  Musculoskeletal: Normal range of motion. He exhibits no edema.  Ulnar deviation in both hands  Lymphadenopathy:    He has no cervical adenopathy.  Neurological: He  is alert and oriented to person, place, and time. He has normal reflexes. No cranial nerve deficit. Gait normal. Coordination normal.  Skin: Skin is warm and dry. No rash noted.  Psychiatric: Mood and affect normal.  Nursing note and vitals reviewed.   LABORATORY DATA:  I have reviewed the data as listed  Results for RAYHAAN, HUSTER (MRN [id number])   Ref. Range 10/07/2014 13:55 12/30/2014 16:41 01/06/2015 19:55 01/27/2015 15:33 05/12/2015 13:38 05/27/2015 15:01  WBC Latest Ref Range: 4.0-10.5 K/uL 2.3 (L) 3.3 (L) 3.4 (L)  2.3 (L) 1.8 (L)  RBC Latest Ref Range: 4.22-5.81 MIL/uL 3.25 (L) 3.21 (L) 3.19 (L)  3.43 (L) 3.29 (L)  Hemoglobin Latest Ref Range: 13.0-17.0 g/dL 10.5 (L) 10.2 (L) 10.4 (L)  11.0 (L) 10.7 (L)  HCT Latest Ref Range: 39.0-52.0 % 30.7 (L) 31.0 (L) 31.1 (L)  32.2 (L) 31.5 (L)  MCV Latest Ref Range: 78.0-100.0 fL 94.5 96.6 97.5  93.9 95.7  MCH Latest Ref Range: 26.0-34.0 pg 32.3 31.8 32.6  32.1 32.5  MCHC Latest Ref Range: 30.0-36.0 g/dL 34.2 32.9 33.4  34.2 34.0  RDW Latest Ref Range: 11.5-15.5 % 14.2 15.2 14.8  14.4 14.1  Platelets Latest Ref Range: 150-400 K/uL 119 (L) 97 (L) 143 (L)  97 (L) 82 (L)  MPV Latest Ref Range: 8.6-12.4 fL 10.1 10.9   10.2     ASSESSMENT & PLAN:  Pancytopenia 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 Decreased iron stores were noted. Cytogenetics showed 20% of the nuclei positive by fish with a 20 2Q deletion. No monosomy 7 is present.   He already has a diagnosis of MDS with 22q deletion. Bone marrow biopsy at  that time revealed low iron stores. On review of his laboratory studies he has had a slow decline in his counts. I have recommended additional evaluation of his iron stores, B12 and other labs as detailed below. Replacing his iron if needed may help some with his fatigue and anemia. I suspect overall however that his progressive weakness is related to multiple issues including his myelodysplasia. Unfortunately he would not be a candidate for therapy such as Vidaza or Dacogen.  He would be a candidate for supportive therapy. We will check an erythropoietin level today as well.  The patient does not have a good understanding of his bone marrow process. I spent time discussing this with he and his wife. At our visit today he is very focused on the weakness in his legs and his desire to spend more time at his home in [place]. We discussed these issues as well. I will see him back in 2 weeks to review the laboratory studies below and make additional recommendations at that time.   Orders Placed This Encounter  Procedures  . CBC with Differential  . Sedimentation rate  . Comprehensive metabolic panel  . Lactate dehydrogenase  . Immunofixation electrophoresis  . Protein electrophoresis, serum  . IgG, IgA, IgM  . Iron and TIBC  . Ferritin  . Erythropoietin  . Reticulocytes  . Haptoglobin  . Vitamin B12  . Folate  . C-reactive protein  . Testosterone  . Testosterone, free  . TSH  . Pathologist smear review    All questions were answered. The patient knows  to call the clinic with any problems, questions or concerns.   This document serves as a record of services personally performed by Ancil Linsey, MD. It was created on her behalf by Janace Hoard, a trained medical scribe. The creation of this record is based on the scribe's personal observations and the provider's statements to them. This document has been checked and approved by the attending provider.  I have reviewed the above  documentation for accuracy and completeness, and I agree with the above.  This note was electronically signed.  Kelby Fam. Whitney Muse, MD

## 2015-05-28 ENCOUNTER — Other Ambulatory Visit: Payer: Self-pay | Admitting: Pharmacist

## 2015-05-28 LAB — PROTEIN ELECTROPHORESIS, SERUM
A/G Ratio: 1.2 (ref 0.7–1.7)
Albumin ELP: 3.8 g/dL (ref 2.9–4.4)
Alpha-1-Globulin: 0.2 g/dL (ref 0.0–0.4)
Alpha-2-Globulin: 0.6 g/dL (ref 0.4–1.0)
BETA GLOBULIN: 1 g/dL (ref 0.7–1.3)
Gamma Globulin: 1.3 g/dL (ref 0.4–1.8)
Globulin, Total: 3.2 g/dL (ref 2.2–3.9)
Total Protein ELP: 7 g/dL (ref 6.0–8.5)

## 2015-05-28 LAB — IGG, IGA, IGM
IGG (IMMUNOGLOBIN G), SERUM: 1098 mg/dL (ref 700–1600)
IgA: 223 mg/dL (ref 61–437)
IgM, Serum: 133 mg/dL (ref 15–143)

## 2015-05-28 LAB — TESTOSTERONE: Testosterone: 197 ng/dL — ABNORMAL LOW (ref 348–1197)

## 2015-05-28 LAB — IMMUNOFIXATION ELECTROPHORESIS
IgA: 238 mg/dL (ref 61–437)
IgG (Immunoglobin G), Serum: 1163 mg/dL (ref 700–1600)
IgM, Serum: 143 mg/dL (ref 15–143)
TOTAL PROTEIN ELP: 6.8 g/dL (ref 6.0–8.5)

## 2015-05-28 LAB — TESTOSTERONE, FREE: Testosterone, Free: 1.6 pg/mL — ABNORMAL LOW (ref 6.6–18.1)

## 2015-05-28 LAB — HAPTOGLOBIN

## 2015-05-28 LAB — PATHOLOGIST SMEAR REVIEW

## 2015-05-28 LAB — ERYTHROPOIETIN: Erythropoietin: 29.7 m[IU]/mL — ABNORMAL HIGH (ref 2.6–18.5)

## 2015-05-28 NOTE — Patient Outreach (Signed)
Called to follow up with Mr and [name] about Mr. Rowlette' medication adherence, constipation and his appointment yesterday with hematology.  Mrs. Aguilera reports that the patient is doing well. States that he is no longer having any issues with constipation, has been using the Miralax. Reports that patient was recently resistant to taking his hydrochlorothiazide due to its making him urinate more frequently. However, Mrs. Gram states that she reminded him of the importance of him taking all of his medications in order to stay well and the patient was willing to take it. Confirmed that patient is taking this medication first thing in the morning as to reduce the chance that it is causing him to urinate more frequently overnight.  Mrs. Losee reports that Mr. Vondrak has been eating well. Reports that he doesn't eat a lot at each meal, but that she is also making sure that he drinks Ensure to supplement this. Reports that she needs to go and get some more as she just noted that he is running low.  Reports that the patient's appointment with hematology went well. Patient had many tubes of blood drawn. Reports that they have a follow up appointment in 3 weeks to find out the results of the blood work.  Mrs. Cartwright reports that she and Mr. Vise have no medication questions or concerns for me at this time, but that she has my phone number if they think of anything.  Let Mrs. Manuele know that I will follow up with them again on 06/16/15, following this next hematology appointment.  [name], PharmD Clinical Pharmacist [place] [facility] [phone]

## 2015-06-03 ENCOUNTER — Other Ambulatory Visit: Payer: Self-pay | Admitting: *Deleted

## 2015-06-03 NOTE — Patient Outreach (Signed)
[facility]) Care Management  06/03/2015  Hendryx Ricke 1925/10/29 [id number]   Phone call from patient's spouse confirming that they did not qualify for Hospice services at this time.  This confirms email received on 05/27/15 from Adela Lank, LPN from [facility]. Per patient's spouse, she still has [name] providing personal care services when needed.  Per patient's spouse, is no ton a set schedule.  Per patient's spouse, patient is becoming a little more confused and appears to be losing weight. RNCM to be notified.  Per patient's spouse, she understands the need to take patient's keys for safety precautions.  They continue to visit his home regularly in [place]. Patient's wife's daughter continues to be supportive  Patient's spouse verbalized no further social work needs.  Case to be closed to social work at this time.

## 2015-06-05 ENCOUNTER — Ambulatory Visit (INDEPENDENT_AMBULATORY_CARE_PROVIDER_SITE_OTHER): Payer: Medicare Other | Admitting: Otolaryngology

## 2015-06-05 DIAGNOSIS — H903 Sensorineural hearing loss, bilateral: Secondary | ICD-10-CM

## 2015-06-05 DIAGNOSIS — H6123 Impacted cerumen, bilateral: Secondary | ICD-10-CM | POA: Diagnosis not present

## 2015-06-12 ENCOUNTER — Ambulatory Visit (INDEPENDENT_AMBULATORY_CARE_PROVIDER_SITE_OTHER): Payer: Medicare Other | Admitting: Family Medicine

## 2015-06-12 ENCOUNTER — Encounter: Payer: Self-pay | Admitting: Family Medicine

## 2015-06-12 VITALS — BP 126/60 | HR 64 | Resp 18 | Ht 69.0 in | Wt 154.0 lb

## 2015-06-12 DIAGNOSIS — G309 Alzheimer's disease, unspecified: Secondary | ICD-10-CM

## 2015-06-12 DIAGNOSIS — R296 Repeated falls: Secondary | ICD-10-CM

## 2015-06-12 DIAGNOSIS — M159 Polyosteoarthritis, unspecified: Secondary | ICD-10-CM

## 2015-06-12 DIAGNOSIS — I10 Essential (primary) hypertension: Secondary | ICD-10-CM | POA: Diagnosis not present

## 2015-06-12 DIAGNOSIS — Z23 Encounter for immunization: Secondary | ICD-10-CM

## 2015-06-12 DIAGNOSIS — N3010 Interstitial cystitis (chronic) without hematuria: Secondary | ICD-10-CM

## 2015-06-12 DIAGNOSIS — F0280 Dementia in other diseases classified elsewhere without behavioral disturbance: Secondary | ICD-10-CM

## 2015-06-12 NOTE — Progress Notes (Signed)
   Subjective:    Patient ID: [name], male    DOB: November 24, 1925, 81 y.o.   MRN: [id number]  HPI The PT is here for follow up and re-evaluation of chronic medical conditions, medication management and review of any available recent lab and radiology data.  Preventive health is updated, specifically  Cancer screening and Immunization.   Questions or concerns regarding consultations or procedures which the PT has had in the interim are  addressed. The PT denies any adverse reactions to current medications since the last visit.  There are no new concerns.  There are no specific complaints       Review of Systems See HPI Denies recent fever or chills. Denies sinus pressure, nasal congestion, ear pain or sore throat. Denies chest congestion, productive cough or wheezing. Denies chest pains, palpitations and leg swelling Denies abdominal pain, nausea, vomiting,diarrhea or constipation.   Denies dysuria,y, does have  hesitancy incontinence and nocturia C/o chronic  joint pain,  and limitation in mobility. Denies headaches, seizures, numbness, or tingling. Denies depression does have mild , anxietand or insomnia. Denies skin break down or rash.        Objective:   Physical Exam BP 126/60 mmHg  Pulse 64  Resp 18  Ht 5\' 9"  (1.753 m)  Wt 154 lb 0.6 oz (69.872 kg)  BMI 22.74 kg/m2  SpO2 95%  Patient alert and oriented and in no cardiopulmonary distress.  HEENT: No facial asymmetry, EOMI,   oropharynx pink and moist.  Neck supple no JVD, no mass.  Chest: Clear to auscultation bilaterally.  CVS: S1, S2 systolic  murmur, no S3. Irregular rate.  ABD: Soft non tender.   Ext: No edema  MS: Decreased  ROM spine, shoulders, hips and knees.  Skin: Intact, no ulcerations or rash noted.  Psych: Good eye contact, normal affect. Memory loss not anxious or depressed appearing.  CNS: CN 2-12 intact, power,  normal throughout.no focal deficits noted.        Assessment & Plan:    Essential hypertension Controlled, no change in medication   Alzheimer's dementia Increasing behavioral problems,, does not taking medication regularly, and unwilling to do so, increasing need for help at home  Campus current medication  Recurrent falls Home safety reviewed, and importance of fall prevention stressed, including use of assistive device  INTERSTITIAL CYSTITIS Followed by urology has implanted device as well as is on medication for symptoms, still c/o excessive nocturia and wants to stop his diuretic, the importance of maintaining medications as prescribed for heart failure protection is again discussed

## 2015-06-12 NOTE — Patient Instructions (Addendum)
F/u in early February, call if you need before  Flu  Vaccine today  You need to continue all  Medication listed, INCLUDING fluid pill every morning    Thanks for choosing  Primary Care, we consider it a privelige to serve you.

## 2015-06-13 ENCOUNTER — Other Ambulatory Visit: Payer: Self-pay | Admitting: *Deleted

## 2015-06-13 NOTE — Patient Outreach (Signed)
[name]) Care Management   06/13/2015  [name] [birth year] [id number]  [name] is an 81 y.o. male  Subjective: "I am taking all of my medications when [name] gives them to me but I don't like taking that little half a pill. That pill makes me run to the bathroom." "I want to go to my house because I sit here and worry about my house when I can't go there."   Objective: Blood pressure 124/72, pulse 64, resp. rate 16, weight 157 lb (71.215 kg), SpO2 95 % on room air.  Review of Systems  Genitourinary: Positive for frequency.  Neurological: Positive for weakness.  Endo/Heme/Allergies: Bruises/bleeds easily.  Psychiatric/Behavioral: Positive for memory loss.    Physical Exam  Constitutional: He is oriented to person, place, and time. He appears well-developed and well-nourished.  Pt describes a general overall weakness, which keeps him from doing much.  Cardiovascular: Normal rate.   Murmur heard. Pulses:      Dorsalis pedis pulses are 2+ on the right side, and 2+ on the left side.  Respiratory: Effort normal and breath sounds normal.  GI: Soft. Bowel sounds are normal.  Musculoskeletal: Normal range of motion. He exhibits no edema.  Neurological: He is alert and oriented to person, place, and time.  Skin: Skin is warm and dry. Bruising noted.  Pt with small purple bruises both arms pt states these bruises bleed easily if scratched or bumped. Several of these areas noted to be in the healing stage.  Psychiatric: He expresses inappropriate judgment. He exhibits abnormal recent memory.  Pt insisting he should be able to drive. RNCM making pt aware the medical decision has been made that he is not to drive.     Current Medications:  Medications reviewed please see encounter for currently taking meds.  Current Outpatient Prescriptions  Medication Sig Dispense Refill  . amLODipine (NORVASC) 2.5 MG tablet Take 1 tablet (2.5 mg total) by mouth daily. 30 tablet 3  .  diclofenac sodium (VOLTAREN) 1 % GEL Apply 1 application topically 3 (three) times daily.    . dorzolamide-timolol (COSOPT) 22.3-6.8 MG/ML ophthalmic solution Place 1 drop into both eyes 2 (two) times daily.     . fentaNYL (DURAGESIC - DOSED MCG/HR) 25 MCG/HR patch Place 1 patch (25 mcg total) onto the skin every 3 (three) days. 10 patch 0  . hydrochlorothiazide (HYDRODIURIL) 25 MG tablet Half tablet every morning 15 tablet 3  . latanoprost (XALATAN) 0.005 % ophthalmic solution Place 1 drop into both eyes at bedtime.     . Meth-Hyo-M Bl-Na Phos-Ph Sal (URIBEL) 118 MG CAPS Take 118 mg by mouth 3 (three) times daily as needed (bladder spasms).     . polyethylene glycol powder (GLYCOLAX/MIRALAX) powder Take 17 g by mouth daily. 3350 g 1  . traMADol (ULTRAM) 50 MG tablet Take 1 tablet (50 mg total) by mouth daily as needed for moderate pain. 30 tablet 2   No current facility-administered medications for this visit.    Functional Status:   In your present state of health, do you have any difficulty performing the following activities: 02/25/2015 02/13/2015  Hearing? [name]  Vision? Y Y  Difficulty concentrating or making decisions? [name]  Walking or climbing stairs? Y N  Dressing or bathing? Y Y  Doing errands, shopping? [name]  Preparing Food and eating ? - Y  Using the Toilet? - N  In the past six months, have you accidently leaked urine? - Y  Do you have problems with loss of bowel control? - N  Managing your Medications? - Y  Managing your Finances? - Y  Housekeeping or managing your Housekeeping? - Y     Assessment: See physical exam above.   General: Pt talkative, well groomed with clean clothes. Spouse present during visit. RNCM discussed with pt reasons why he is unable to drive. RNCM and pt discussed reasons why he needed to take his medication as prescribed. RNCM discussed with spouse the dangers of leaving pt alone. RNCM discussed with spouse and pt the possibility of hiring a young man  from their church to drive [name] to his house in [place] to assist with up keep. Spouse stated she would look into it. Spouse stated she realizes after an episode that happened over the weekend she can no longer leave [name] alone.   RNCM discussed with pt and spouse that care coordination goals from [name] and SW have been met but pharmacy goals are still ongoing. RNCM gave spouse phone number for med alert through [facility] to obtain for safety reasons. Spouse stated she would call.   HF: Pt stated he had ongoing weakness and fatigue. Pt stated he was still SOB with exertion but no more than before. No edema to lower legs, abd or hands. With pt's dementia he is unable to navigate the HF zones but spouse aware of when to call the MD.   Weakness: Pt going to see hematologist/oncologist next week for f/u on abnormal blood work.   Plan: RNCM will resolve community care management at this time.  RNCM will discuss with [facility] continued pharmacy goals.  RNCM will make PCP aware RN goals and SW goals have been met.   Brian Limerick RN, BSN  [facility] Care Management [phone])

## 2015-06-18 ENCOUNTER — Other Ambulatory Visit: Payer: Self-pay | Admitting: Pharmacist

## 2015-06-18 NOTE — Patient Outreach (Signed)
Called to follow up with Mr and [name] about Mr. Lofstrom' medication adherence and about his follow up appointment with hematology. Spoke with Mr. And [name]. Mrs. Kluever reports that the patient is a little tired today, but overall is "doing nicely."  Reports that patient has eating well and drinking ensure daily for additional intake. Reports that he has not continued to lose weight. Reports that he is no longer having trouble with constipation with use of Miralax.  Mrs. Mcneal reports that the patient continues to be adherent to taking all of his medications. Reports that she gives them to him each day and that, while he reports that he does not like taking the hydrochlorothiazide due to its making him urinate more frequently, he also takes it consistently.   Mrs. Kliebert reports that Mr. Kiernan has not yet had his hematology follow up appointment, which is scheduled for 06/20/15. Both Mr and Mrs. Peral report that they have no medication questions at this time.  Let Mrs. Laplant know that I will follow up with them again on 06/23/15, following this hematology appointment.  [name], PharmD Clinical Pharmacist [place] [facility] [phone]

## 2015-06-20 ENCOUNTER — Encounter (HOSPITAL_COMMUNITY): Payer: Self-pay | Admitting: Hematology & Oncology

## 2015-06-20 ENCOUNTER — Encounter (HOSPITAL_COMMUNITY): Payer: Medicare Other | Attending: Hematology & Oncology | Admitting: Hematology & Oncology

## 2015-06-20 VITALS — BP 133/85 | HR 69 | Temp 98.4°F | Resp 18

## 2015-06-20 DIAGNOSIS — D469 Myelodysplastic syndrome, unspecified: Secondary | ICD-10-CM

## 2015-06-20 DIAGNOSIS — R5383 Other fatigue: Secondary | ICD-10-CM | POA: Insufficient documentation

## 2015-06-20 DIAGNOSIS — D61818 Other pancytopenia: Secondary | ICD-10-CM | POA: Insufficient documentation

## 2015-06-20 DIAGNOSIS — Q069 Congenital malformation of spinal cord, unspecified: Secondary | ICD-10-CM

## 2015-06-20 DIAGNOSIS — IMO0001 Reserved for inherently not codable concepts without codable children: Secondary | ICD-10-CM | POA: Insufficient documentation

## 2015-06-20 DIAGNOSIS — D649 Anemia, unspecified: Secondary | ICD-10-CM | POA: Insufficient documentation

## 2015-06-20 NOTE — Patient Instructions (Addendum)
[place] at [facility] Discharge Instructions  RECOMMENDATIONS MADE BY THE CONSULTANT AND ANY TEST RESULTS WILL BE SENT TO YOUR REFERRING PHYSICIAN.  Exam per Dr.Penland. We will get you scheduled to come in for an iron infusion. Dr.Penland will make a Home Health referral for PT/OT to evaluate for exercise and strengthening. Someone from [place] will call you to schedule a time that they can come to your home. Lab work and MD appointment again in 2 weeks. Return as scheduled.  Thank you for choosing [place] at [facility] to provide your oncology and hematology care.  To afford each patient quality time with our provider, please arrive at least 15 minutes before your scheduled appointment time.    You need to re-schedule your appointment should you arrive 10 or more minutes late.  We strive to give you quality time with our providers, and arriving late affects you and other patients whose appointments are after yours.  Also, if you no show three or more times for appointments you may be dismissed from the clinic at the providers discretion.     Again, thank you for choosing [facility].  Our hope is that these requests will decrease the amount of time that you wait before being seen by our physicians.       _____________________________________________________________  Should you have questions after your visit to [facility], please contact our office at [phone] between the hours of 8:30 a.m. and 4:30 p.m.  Voicemails left after 4:30 p.m. will not be returned until the following business day.  For prescription refill requests, have your pharmacy contact our office.

## 2015-06-20 NOTE — Progress Notes (Signed)
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 Decreased iron stores were noted. Cytogenetics showed 20% of the nuclei positive by fish with a 22 q deletion. No monosomy 7 is present.   HISTORY OF PRESENTING ILLNESS:  [name] 81 y.o. male is here because of pancytopenia, referred by Dr. Moshe [name].  The patient has a history of T3BNO melanoma of the L heal treated at [facility] in Jan of 2012.  The patient has MDS with 22Q deletion diagnosed and evaluated by Dr [name] at [facility].  [name] is here with his wife today. He is in a wheelchair today. He says his legs are so weak.  He states that he can walk, but he quickly becomes worn out. He says it's because "it's just hurting." His wife asked why, and he confirms it's because he's tired and his legs give out. He hasn't fallen this past month, but before that, he was falling a lot.  He has not received any physical therapy.   They are here today for additional discussion of his pancytopenia and to review lab results.  MEDICAL HISTORY:  Past Medical History  Diagnosis Date  . Chronic systolic heart failure   . Rash and other nonspecific skin eruption   .  Costochondritis, acute   . Glaucoma   . Erectile dysfunction   . Visual changes   . Pancytopenia   . Chronic pancreatitis     Based on CT findings  . History of melanoma in situ     JAN 2012--  LEFT HEEL W/ SLN BX  . Chronic back pain   . IC (interstitial cystitis)   . BPH (benign prostatic hypertrophy)   . Cardiac pacemaker     CARDIOLOGIST-- DR Cristopher Peru (LAST [facility] 05-15-2013)  . Asymptomatic carotid artery stenosis     BILATERAL MILD ICA---  <50% PER DUPLEX 03-08-2008  . History of syncope   . Hypertension   . CHB (complete heart block)   . Nonischemic cardiomyopathy   . Severe mitral regurgitation   . Urge urinary incontinence   . A-fib   . Dementia   . Alzheimer disease     SURGICAL HISTORY: Past Surgical History  Procedure Laterality Date  . Cataract extraction, bilateral  left  1997/   right 2003  . Pacemaker insertion  12-06-2008  DR GREGG TAYLOR    BiV PPM  --  [employer]  . Colonoscopy  08/18/2006    TMA:UQJF granularity and erosions of the rectum of uncertain significance biopsied.  A long redundant, but otherwise normal-appearing colon.melanosi coli and minimal inflammation on bx  . Inguinal hernia repair Bilateral AS TEEN  . Interstim implant placement  2001  &  2007  . Cysto/ hod/  replacement interstim implant  05-14-2003  . Removal interstim implant/  cyst/  hod  04-14-2004  . Revision interstim implant and replace generator  05-15-2009    left upper buttock for urge urinary incontinence  . Wide excision left heel and sln bx  JUNE 2012  . Tonsillectomy  1950  . Left elbow surgery  2002  . Back surgery  X3  LAST ONE'90's  . Transthoracic echocardiogram  12-06-2008    LVSF 55-60%/  MODERATE MR/  MILD AR/  QUESTION DISTAL POSTERIOR HYPOKINESIS  . Interstim implant revision N/A 08/28/2013    Procedure: REPLACEMENT OF INTERSTIM-GENERATOR AND LEAD ;  Surgeon: [name], MD;  Location: [place];  Service: Urology;   Laterality: N/A;  . Esophagogastroduodenoscopy N/A 03/28/2014    Dr. [name]: normal esophagus s/p dilation, mosaic appearance - chronic inflammation noted on bx  . Savory dilation N/A 03/28/2014    Procedure: SAVORY DILATION;  Surgeon: [name], MD;  [facility];  Service: Endoscopy;  Laterality: N/A;  Venia Minks dilation N/A 03/28/2014    Procedure: Venia Minks DILATION;  Surgeon: [name], MD;  [facility];  Service: Endoscopy;  Laterality: N/A;  . Colonoscopy N/A 08/05/2014    Procedure: COLONOSCOPY;  Surgeon: [name], MD;  [facility];  Service: Endoscopy;  Laterality: N/A;  PT NEEDS TCS AT 1000.  . Implantable cardioverter defibrillator (icd) generator change N/A 10/11/2014    Procedure: ICD GENERATOR CHANGE;  Surgeon: [name], MD;  Location: [facility] CATH LAB;  Service: Cardiovascular;  Laterality: N/A;    SOCIAL HISTORY: Social History   Social History  . Marital Status: Married    Spouse Name: N/A  . Number of Children: N/A  . Years of Education: N/A   Occupational History  . retired     Social History Main Topics  . Smoking status: Former Smoker -- 0.02 packs/day    Types: Cigarettes    Quit date: 08/24/1997  . Smokeless tobacco: Never Used  . Alcohol Use: No  . Drug Use: No  . Sexual Activity:    Partners: Male   Other Topics Concern  . 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 Dementia Mother   . Throat cancer Father   . Cancer Father 86    throat  . Lung cancer Sister   . Heart disease Brother    indicated that his mother is deceased. He indicated that his father is deceased. He indicated that two of his five sisters are alive. He indicated that only one of his two brothers is alive.   ALLERGIES:  is allergic to codeine and penicillins. Father deceased at  2, Head and Neck Cancer Mother deceased at 16, Dementia 72 brothers and sisters; 3 brothers deceased, 2 sisters deceased    MEDICATIONS:  Current Outpatient Prescriptions  Medication Sig Dispense Refill  . amLODipine (NORVASC) 2.5 MG tablet Take 1 tablet (2.5 mg total) by mouth daily. 30 tablet 3  . diclofenac sodium (VOLTAREN) 1 % GEL Apply 1 application topically 3 (three) times daily.    . dorzolamide-timolol (COSOPT) 22.3-6.8 MG/ML ophthalmic solution Place 1 drop into both eyes 2 (two) times daily.     . fentaNYL (DURAGESIC - DOSED MCG/HR) 25 MCG/HR patch Place 1 patch (25 mcg total) onto the skin every 3 (three) days. 10 patch 0  .  hydrochlorothiazide (HYDRODIURIL) 25 MG tablet Half tablet every morning 15 tablet 3  . latanoprost (XALATAN) 0.005 % ophthalmic solution Place 1 drop into both eyes at bedtime.     . Meth-Hyo-M Bl-Na Phos-Ph Sal (URIBEL) 118 MG CAPS Take 118 mg by mouth 3 (three) times daily as needed (bladder spasms).     . polyethylene glycol powder (GLYCOLAX/MIRALAX) powder Take 17 g by mouth daily. 3350 g 1  . traMADol (ULTRAM) 50 MG tablet Take 1 tablet (50 mg total) by mouth daily as needed for moderate pain. 30 tablet 2   No current facility-administered medications for this visit.    Review of Systems  Constitutional: Negative.        In wheelchair.  HENT: Negative.   Eyes:       Weakening  Respiratory: Negative.   Cardiovascular: Negative.   Gastrointestinal: Negative.   Musculoskeletal:       Weak legs Back pain  Skin: Negative.   Neurological: Negative.   Endo/Heme/Allergies: Negative.   Psychiatric/Behavioral: Negative.   All other systems reviewed and are negative.  14 point ROS was done and is otherwise as detailed above or in HPI    PHYSICAL EXAMINATION: ECOG PERFORMANCE STATUS: 2 - Symptomatic, <50% confined to bed  Filed Vitals:   06/20/15 1333  BP: 133/85  Pulse: 69  Temp: 98.4 F (36.9 C)  Resp: 18   There were no vitals  filed for this visit.   Physical Exam  Constitutional: He is oriented to person, place, and time and well-developed, well-nourished, and in no distress.  HENT:  Head: Normocephalic and atraumatic.  Eyes:  L eye, lazy  Neck: Normal range of motion. Neck supple.  Cardiovascular: Normal rate and regular rhythm.   Palpable pacemaker, R chest Diastolic murmur  Pulmonary/Chest: Effort normal and breath sounds normal.  Abdominal: Soft. Bowel sounds are normal.  Musculoskeletal: Normal range of motion.  Ulnar deviation in both hands  Neurological: He is alert and oriented to person, place, and time.  Skin: Skin is warm and dry.    LABORATORY DATA:  I have reviewed the data as listed  Results for MONTRE, HARBOR (MRN [id number]) as of 07/01/2015 07:45  Ref. Range 05/27/2015 15:01 05/27/2015 15:01 05/27/2015 15:01  Sodium Latest Ref Range: 135-145 mmol/L 140    Potassium Latest Ref Range: 3.5-5.1 mmol/L 3.8    Chloride Latest Ref Range: 101-111 mmol/L 105    CO2 Latest Ref Range: 22-32 mmol/L 29    BUN Latest Ref Range: 6-20 mg/dL 23 (H)    Creatinine Latest Ref Range: 0.61-1.24 mg/dL 1.06    Calcium Latest Ref Range: 8.9-10.3 mg/dL 8.5 (L)    EGFR (Non-African Amer.) Latest Ref Range: >60 mL/min >60    EGFR (African American) Latest Ref Range: >60 mL/min >60    Glucose Latest Ref Range: 65-99 mg/dL 126 (H)    Anion gap Latest Ref Range: 5-15  6    Alkaline Phosphatase Latest Ref Range: 38-126 U/L 52    Albumin Latest Ref Range: 3.5-5.0 g/dL 3.6    AST Latest Ref Range: 15-41 U/L 24    ALT Latest Ref Range: 17-63 U/L 16 (L)    Total Protein Latest Ref Range: 6.5-8.1 g/dL 6.4 (L)    Total Bilirubin Latest Ref Range: 0.3-1.2 mg/dL 1.2    LDH Latest Ref Range: 98-192 U/L 269 (H)    Iron Latest Ref Range: 45-182 ug/dL 61    UIBC Latest Units: ug/dL 303    TIBC Latest Ref  Range: 250-450 ug/dL 364    Saturation Ratios Latest Ref Range: 17.9-39.5 % 17 (L)    Ferritin Latest Ref Range: 24-336  ng/mL 51    Folate Latest Ref Range: >5.9 ng/mL 15.6    CRP Latest Ref Range: <1.0 mg/dL <0.5    Vitamin B-12 Latest Ref Range: 180-914 pg/mL 662    Total Protein ELP Latest Ref Range: 6.0-8.5 g/dL  7.0 6.8  Albumin ELP Latest Ref Range: 2.9-4.4 g/dL  3.8   Globulin, Total Latest Ref Range: 2.2-3.9 g/dL  3.2   A/G Ratio Latest Ref Range: 0.7-1.7   1.2   Alpha-1 Glubulin Latest Ref Range: 0.0-0.4 g/dL  0.2   Alpha-2 Globulin Latest Ref Range: 0.4-1.0 g/dL  0.6   Beta Globulin Latest Ref Range: 0.7-1.3 g/dL  1.0   Gamma Globulin Latest Ref Range: 0.4-1.8 g/dL  1.3   M-SPIKE, % Latest Ref Range: Not Observed g/dL  Not Observed   SPE Interp. Unknown  Comment   Comment Unknown  Comment   IgG (Immunoglobin G), Serum Latest Ref Range: [phone] mg/dL 1098  1163  IgA Latest Ref Range: 61-437 mg/dL 223  238  IgM, Serum Latest Ref Range: 15-143 mg/dL 133  143  WBC Latest Ref Range: 4.0-10.5 K/uL 1.8 (L)    RBC Latest Ref Range: 4.22-5.81 MIL/uL 3.29 (L)    Hemoglobin Latest Ref Range: 13.0-17.0 g/dL 10.7 (L)    HCT Latest Ref Range: 39.0-52.0 % 31.5 (L)    MCV Latest Ref Range: 78.0-100.0 fL 95.7    MCH Latest Ref Range: 26.0-34.0 pg 32.5    MCHC Latest Ref Range: 30.0-36.0 g/dL 34.0    RDW Latest Ref Range: 11.5-15.5 % 14.1    Platelets Latest Ref Range: 150-400 K/uL 82 (L)    Neutrophils Latest Ref Range: 43-77 % 67    Lymphocytes Latest Ref Range: 12-46 % 20    Monocytes Relative Latest Ref Range: 3-12 % 8    Eosinophil Latest Ref Range: 0-5 % 4    Basophil Latest Ref Range: 0-1 % 1    NEUT# Latest Ref Range: 1.7-7.7 K/uL 1.2 (L)    Lymphocyte # Latest Ref Range: 0.7-4.0 K/uL 0.4 (L)    Monocyte # Latest Ref Range: 0.1-1.0 K/uL 0.1    Eosinophils Absolute Latest Ref Range: 0.0-0.7 K/uL 0.1    Basophils Absolute Latest Ref Range: 0.0-0.1 K/uL 0.0    RBC. Latest Ref Range: 4.22-5.81 MIL/uL 3.29 (L)    Retic Ct Pct Latest Ref Range: 0.4-3.1 % 0.8    Retic Count, Manual Latest Ref Range:  19.0-186.0 K/uL 26.3    Haptoglobin Latest Ref Range: 34-200 mg/dL <10 (L)    Erythropoietin Latest Ref Range: 2.6-18.5 mIU/mL 29.7 (H)    Sed Rate Latest Ref Range: 0-16 mm/hr 9    Testosterone Latest Ref Range: 330-266-3184 ng/dL 197 (L)    Testosterone Free Latest Ref Range: 6.6-18.1 pg/mL 1.6 (L)    TSH Latest Ref Range: 0.350-4.500 uIU/mL 2.875    Immunofixation Result, Serum Unknown   Comment    Pathology review with pancytopenia  INTERPRETATION Interpretation Peripheral Blood Flow Cytometry - PREDOMINANCE OF T CELLS WITH NO ABNORMAL PHENOTYPE. - MINOR B-CELL POPULATION PRESENT. - SEE NOTE. Diagnosis Comment: Analysis of the lymphoid population shows predominance of T cells without an aberrant phenotype. B cells represent the minor population (less than 10% of lymphocytes) with expression of pan B cell antigens but with dim/negative staining for surface immunoglobulin light chains that hinder assessment and/or quantitation  of clonality. However, an abnormal B-cell phenotype such as co-expression of CD5 and CD20 or CD10 expression is not identified. Clinical correlation is recommended. 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 Decreased iron stores were noted. Cytogenetics showed 20% of the nuclei positive by fish with a 22 q deletion. No monosomy 7 is present. Erythopoietin Level 29.7 mIU/ml Hypogonadism Low serum iron with ferritin of 51 ng/ml Low haptoglobin, mild elevation of LDH, normal bilirubin   Because of the patient's fatigue is multifactorial including his MDS and  hypogonadism, cardiac status, deconditioning and age. His blood counts are reasonable. If he becomes more anemic he would be a candidate for a trial of growth factor support. If his white blood count becomes an issue, with significant neutropenia and/or infection he would also be a candidate for growth factor support. Currently I have recommended observation of his blood counts. His iron level is not optimal and we will arrange for a dose of IV iron to see if he obtains some benefit in his counts.  I have arranged for home health with therapy. The patient is home bound and continues to progressively limit his ambulation. I discussed with him the importance of staying active, decreasing his activity further contributes to his fatigue and debility.  I will plan on seeing him back in several weeks with laboratory studies we will also check a copper and zinc level at follow-up.  I provided the patient and his wife with education information on MDS.   Orders Placed This Encounter  Procedures  . Copper, serum    Standing Status: Future     Number of Occurrences:      Standing Expiration Date: 06/19/2016  . Zinc    Standing Status: Future     Number of Occurrences:      Standing Expiration Date: 06/19/2016  . CBC with Differential    Standing Status: Future     Number of Occurrences:      Standing Expiration Date: 06/19/2016    All questions were answered. The patient knows to call the clinic with any problems, questions or concerns.  This document serves as a record of services personally performed by Ancil Linsey, MD. It was created on her behalf by Toni Amend, a trained medical scribe. The creation of this record is based on the scribe's personal observations and the provider's statements to them. This document has been checked and approved by the attending provider.  I have reviewed the above documentation for accuracy and completeness, and I agree with the above.  This note was  electronically signed.  Kelby Fam. Whitney Muse, MD

## 2015-06-22 ENCOUNTER — Encounter (HOSPITAL_COMMUNITY): Payer: Self-pay | Admitting: Hematology & Oncology

## 2015-06-23 DIAGNOSIS — Z9181 History of falling: Secondary | ICD-10-CM | POA: Diagnosis not present

## 2015-06-23 DIAGNOSIS — D469 Myelodysplastic syndrome, unspecified: Secondary | ICD-10-CM | POA: Diagnosis not present

## 2015-06-23 DIAGNOSIS — Z950 Presence of cardiac pacemaker: Secondary | ICD-10-CM | POA: Diagnosis not present

## 2015-06-23 DIAGNOSIS — R296 Repeated falls: Secondary | ICD-10-CM | POA: Diagnosis not present

## 2015-06-23 DIAGNOSIS — R2689 Other abnormalities of gait and mobility: Secondary | ICD-10-CM | POA: Diagnosis not present

## 2015-06-23 DIAGNOSIS — G309 Alzheimer's disease, unspecified: Secondary | ICD-10-CM | POA: Diagnosis not present

## 2015-06-26 DIAGNOSIS — Z9181 History of falling: Secondary | ICD-10-CM | POA: Diagnosis not present

## 2015-06-26 DIAGNOSIS — R296 Repeated falls: Secondary | ICD-10-CM | POA: Diagnosis not present

## 2015-06-26 DIAGNOSIS — Z950 Presence of cardiac pacemaker: Secondary | ICD-10-CM | POA: Diagnosis not present

## 2015-06-26 DIAGNOSIS — D469 Myelodysplastic syndrome, unspecified: Secondary | ICD-10-CM | POA: Diagnosis not present

## 2015-06-26 DIAGNOSIS — G309 Alzheimer's disease, unspecified: Secondary | ICD-10-CM | POA: Diagnosis not present

## 2015-06-26 DIAGNOSIS — R2689 Other abnormalities of gait and mobility: Secondary | ICD-10-CM | POA: Diagnosis not present

## 2015-06-27 ENCOUNTER — Encounter (HOSPITAL_COMMUNITY): Payer: Self-pay

## 2015-06-27 ENCOUNTER — Other Ambulatory Visit: Payer: Self-pay | Admitting: Pharmacist

## 2015-06-27 ENCOUNTER — Encounter (HOSPITAL_BASED_OUTPATIENT_CLINIC_OR_DEPARTMENT_OTHER): Payer: Medicare Other

## 2015-06-27 DIAGNOSIS — G309 Alzheimer's disease, unspecified: Secondary | ICD-10-CM | POA: Diagnosis not present

## 2015-06-27 DIAGNOSIS — D469 Myelodysplastic syndrome, unspecified: Secondary | ICD-10-CM | POA: Diagnosis present

## 2015-06-27 DIAGNOSIS — R2689 Other abnormalities of gait and mobility: Secondary | ICD-10-CM | POA: Diagnosis not present

## 2015-06-27 DIAGNOSIS — R296 Repeated falls: Secondary | ICD-10-CM | POA: Diagnosis not present

## 2015-06-27 DIAGNOSIS — Z950 Presence of cardiac pacemaker: Secondary | ICD-10-CM | POA: Diagnosis not present

## 2015-06-27 DIAGNOSIS — Z9181 History of falling: Secondary | ICD-10-CM | POA: Diagnosis not present

## 2015-06-27 MED ORDER — SODIUM CHLORIDE 0.9 % IV SOLN
125.0000 mg | Freq: Once | INTRAVENOUS | Status: AC
  Administered 2015-06-27: 125 mg via INTRAVENOUS
  Filled 2015-06-27: qty 10

## 2015-06-27 MED ORDER — SODIUM CHLORIDE 0.9 % IV SOLN
INTRAVENOUS | Status: DC
  Administered 2015-06-27: 13:00:00 via INTRAVENOUS

## 2015-06-27 NOTE — Patient Instructions (Signed)
[place] at [facility] Discharge Instructions  RECOMMENDATIONS MADE BY THE CONSULTANT AND ANY TEST RESULTS WILL BE SENT TO YOUR REFERRING PHYSICIAN.  IV iron today Return as scheduled Please call the clinic if you have any questions or concerns  Thank you for choosing [place] at [facility] to provide your oncology and hematology care.  To afford each patient quality time with our provider, please arrive at least 15 minutes before your scheduled appointment time.    You need to re-schedule your appointment should you arrive 10 or more minutes late.  We strive to give you quality time with our providers, and arriving late affects you and other patients whose appointments are after yours.  Also, if you no show three or more times for appointments you may be dismissed from the clinic at the providers discretion.     Again, thank you for choosing [facility].  Our hope is that these requests will decrease the amount of time that you wait before being seen by our physicians.       _____________________________________________________________  Should you have questions after your visit to [facility], please contact our office at [phone] between the hours of 8:30 a.m. and 4:30 p.m.  Voicemails left after 4:30 p.m. will not be returned until the following business day.  For prescription refill requests, have your pharmacy contact our office.

## 2015-06-27 NOTE — Patient Outreach (Signed)
Called to follow up with Mr.  [name] following his hematology follow up appointment. Left a HIPAA compliant message on the patient's voicemail. If have not heard from patient by next week will give him another call at that time.  [name], PharmD Clinical Pharmacist [place] [facility] [phone]

## 2015-06-27 NOTE — Progress Notes (Signed)
[name] Tolerated iron infusion well today

## 2015-06-30 ENCOUNTER — Other Ambulatory Visit: Payer: Self-pay | Admitting: Pharmacist

## 2015-06-30 DIAGNOSIS — D469 Myelodysplastic syndrome, unspecified: Secondary | ICD-10-CM | POA: Diagnosis not present

## 2015-06-30 DIAGNOSIS — Z950 Presence of cardiac pacemaker: Secondary | ICD-10-CM | POA: Diagnosis not present

## 2015-06-30 DIAGNOSIS — Z9181 History of falling: Secondary | ICD-10-CM | POA: Diagnosis not present

## 2015-06-30 DIAGNOSIS — G309 Alzheimer's disease, unspecified: Secondary | ICD-10-CM | POA: Diagnosis not present

## 2015-06-30 DIAGNOSIS — R296 Repeated falls: Secondary | ICD-10-CM | POA: Diagnosis not present

## 2015-06-30 DIAGNOSIS — R2689 Other abnormalities of gait and mobility: Secondary | ICD-10-CM | POA: Diagnosis not present

## 2015-06-30 NOTE — Patient Outreach (Signed)
Called to follow up with Mr and Mrs. [name] following [name]' follow up appointment with hematology.   Speak with [name] who reports that at the follow up hematology appointment, they were told that the patient has cancer of the blood. Reports that there is a therapist in the home working with [name] as we speak. Reports that the patient has been very weak. Reports that this past Friday he was given an iron infusion. Reports that his doctor told them that this should improve the patient's strength.   [name] reports that she continues to give the patient his daily medications in his pillbox without problem. Reports that they have no further question for me at this time. Confirm that [name] has my contact information. Let [name] know that I will stop following the patient for now, but ask that they reach out to me with future questions.   [name], PharmD Clinical Pharmacist [place] [facility] [phone]

## 2015-07-02 ENCOUNTER — Ambulatory Visit (INDEPENDENT_AMBULATORY_CARE_PROVIDER_SITE_OTHER): Payer: Medicare Other | Admitting: Urology

## 2015-07-02 DIAGNOSIS — R3915 Urgency of urination: Secondary | ICD-10-CM | POA: Diagnosis not present

## 2015-07-02 DIAGNOSIS — R296 Repeated falls: Secondary | ICD-10-CM | POA: Diagnosis not present

## 2015-07-02 DIAGNOSIS — R300 Dysuria: Secondary | ICD-10-CM | POA: Diagnosis not present

## 2015-07-02 DIAGNOSIS — N390 Urinary tract infection, site not specified: Secondary | ICD-10-CM

## 2015-07-02 DIAGNOSIS — R339 Retention of urine, unspecified: Secondary | ICD-10-CM | POA: Diagnosis not present

## 2015-07-02 DIAGNOSIS — N410 Acute prostatitis: Secondary | ICD-10-CM

## 2015-07-02 DIAGNOSIS — Z9181 History of falling: Secondary | ICD-10-CM | POA: Diagnosis not present

## 2015-07-02 DIAGNOSIS — Z950 Presence of cardiac pacemaker: Secondary | ICD-10-CM | POA: Diagnosis not present

## 2015-07-02 DIAGNOSIS — R2689 Other abnormalities of gait and mobility: Secondary | ICD-10-CM | POA: Diagnosis not present

## 2015-07-02 DIAGNOSIS — D469 Myelodysplastic syndrome, unspecified: Secondary | ICD-10-CM | POA: Diagnosis not present

## 2015-07-02 DIAGNOSIS — G309 Alzheimer's disease, unspecified: Secondary | ICD-10-CM | POA: Diagnosis not present

## 2015-07-02 NOTE — Patient Outreach (Signed)
[place] [facility]) Care Management  07/02/2015  [name] August 04, 1926 [id number]   Notification from [name], PharmD, to close case due to goals met with [name].  Thanks, [name]. [place], [place] Assistant Phone: [phone] Fax: [phone]

## 2015-07-04 ENCOUNTER — Encounter (HOSPITAL_COMMUNITY): Payer: Self-pay | Admitting: Hematology & Oncology

## 2015-07-04 ENCOUNTER — Encounter (HOSPITAL_COMMUNITY): Payer: Medicare Other | Attending: Hematology & Oncology | Admitting: Hematology & Oncology

## 2015-07-04 ENCOUNTER — Encounter (HOSPITAL_BASED_OUTPATIENT_CLINIC_OR_DEPARTMENT_OTHER): Payer: Medicare Other

## 2015-07-04 VITALS — BP 115/87 | HR 71 | Temp 97.8°F | Resp 18 | Wt 158.8 lb

## 2015-07-04 DIAGNOSIS — IMO0001 Reserved for inherently not codable concepts without codable children: Secondary | ICD-10-CM

## 2015-07-04 DIAGNOSIS — D61818 Other pancytopenia: Secondary | ICD-10-CM

## 2015-07-04 DIAGNOSIS — R970 Elevated carcinoembryonic antigen [CEA]: Secondary | ICD-10-CM

## 2015-07-04 DIAGNOSIS — R5383 Other fatigue: Secondary | ICD-10-CM | POA: Diagnosis not present

## 2015-07-04 DIAGNOSIS — D469 Myelodysplastic syndrome, unspecified: Secondary | ICD-10-CM | POA: Diagnosis not present

## 2015-07-04 DIAGNOSIS — D649 Anemia, unspecified: Secondary | ICD-10-CM | POA: Diagnosis not present

## 2015-07-04 DIAGNOSIS — R7989 Other specified abnormal findings of blood chemistry: Secondary | ICD-10-CM | POA: Diagnosis not present

## 2015-07-04 DIAGNOSIS — C439 Malignant melanoma of skin, unspecified: Secondary | ICD-10-CM

## 2015-07-04 DIAGNOSIS — D508 Other iron deficiency anemias: Secondary | ICD-10-CM

## 2015-07-04 LAB — CBC WITH DIFFERENTIAL/PLATELET
BASOS ABS: 0 10*3/uL (ref 0.0–0.1)
Basophils Relative: 1 %
Eosinophils Absolute: 0.1 10*3/uL (ref 0.0–0.7)
Eosinophils Relative: 3 %
HEMATOCRIT: 31.1 % — AB (ref 39.0–52.0)
Hemoglobin: 10.6 g/dL — ABNORMAL LOW (ref 13.0–17.0)
LYMPHS PCT: 19 %
Lymphs Abs: 0.4 10*3/uL — ABNORMAL LOW (ref 0.7–4.0)
MCH: 32.8 pg (ref 26.0–34.0)
MCHC: 34.1 g/dL (ref 30.0–36.0)
MCV: 96.3 fL (ref 78.0–100.0)
MONO ABS: 0.2 10*3/uL (ref 0.1–1.0)
Monocytes Relative: 11 %
NEUTROS ABS: 1.3 10*3/uL — AB (ref 1.7–7.7)
NEUTROS PCT: 66 %
Platelets: 82 10*3/uL — ABNORMAL LOW (ref 150–400)
RBC: 3.23 MIL/uL — ABNORMAL LOW (ref 4.22–5.81)
RDW: 14.2 % (ref 11.5–15.5)
WBC: 2 10*3/uL — AB (ref 4.0–10.5)

## 2015-07-04 NOTE — Patient Instructions (Addendum)
[place] at [facility] Discharge Instructions  RECOMMENDATIONS MADE BY THE CONSULTANT AND ANY TEST RESULTS WILL BE SENT TO YOUR REFERRING PHYSICIAN.  Exam completed by Dr Whitney Muse  Return to see the doctor in 2 months with lab work Referral back to Dr Lovena Le, they will call you. Please call the clinic if you have any questions or concerns  Thank you for choosing [place] at [facility] to provide your oncology and hematology care.  To afford each patient quality time with our Rickardo Brinegar, please arrive at least 15 minutes before your scheduled appointment time.    You need to re-schedule your appointment should you arrive 10 or more minutes late.  We strive to give you quality time with our providers, and arriving late affects you and other patients whose appointments are after yours.  Also, if you no show three or more times for appointments you may be dismissed from the clinic at the providers discretion.     Again, thank you for choosing [facility].  Our hope is that these requests will decrease the amount of time that you wait before being seen by our physicians.       _____________________________________________________________  Should you have questions after your visit to [facility], please contact our office at [phone] between the hours of 8:30 a.m. and 4:30 p.m.  Voicemails left after 4:30 p.m. will not be returned until the following business day.  For prescription refill requests, have your pharmacy contact our office.

## 2015-07-04 NOTE — Progress Notes (Signed)
Labs drawn

## 2015-07-04 NOTE — Progress Notes (Signed)
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 Decreased iron stores were noted. Cytogenetics showed 20% of the nuclei positive by fish with a 22 q deletion. No monosomy 7 is present.   HISTORY OF PRESENTING ILLNESS:  [name] 81 y.o. male is here because of pancytopenia, referred by Dr. Moshe Cipro.  The patient has a history of T3BNO melanoma of the L heal treated at [facility] in Jan of 2012.  The patient has MDS with 22Q deletion diagnosed and evaluated by Dr Sterling Big at [facility].  [name] is here with his wife today. He has had his iron and he looks better today.  With regards to [place], his wife states that "the lady's been coming." She confirms that they are still working with him, but there have been some complications lately because she (his wife) hasn't been feeling well, and she is his primary caregiver, which is difficult given the fact that she is a year older than him. His wife makes a point of saying that she has a lot to tend to when it comes to [name], the household, and all of the cooking. In spite of this, both [name] and his wife are in good spirits today, smiling and laughing often.   [name] does not like taking his fluid pills, and with regards to this, his wife states "I  just give you the pills that the nurse tells me to give you." His main concern appears to be the fact that he is going to the bathroom in the night, which could be a symptom of the time of day he's taking his pills.  Notably, [name] remarks that after he received his new pacemaker, he hasn't felt as good. He sees Dr. Lovena Le for his heart.  He admits to feeling "somewhat stronger in his legs."  MEDICAL HISTORY:  Past Medical History  Diagnosis Date  . Chronic systolic heart failure   . Rash and other nonspecific skin eruption   . Costochondritis, acute   . Glaucoma   . Erectile dysfunction   . Visual changes   . Pancytopenia   . Chronic pancreatitis     Based on CT findings  . History of melanoma in situ     JAN 2012--  LEFT HEEL W/ SLN BX  . Chronic back pain   . IC (interstitial cystitis)   . BPH (benign prostatic hypertrophy)   . Cardiac pacemaker     CARDIOLOGIST-- DR Cristopher Peru (LAST PACE [facility] 05-15-2013)  . Asymptomatic carotid artery stenosis     BILATERAL MILD ICA---  <50% PER DUPLEX 03-08-2008  . History of syncope   . Hypertension   . CHB (complete heart block)   . Nonischemic cardiomyopathy   . Severe mitral regurgitation   . Urge urinary incontinence   . A-fib   . Dementia   .  Alzheimer disease     SURGICAL HISTORY: Past Surgical History  Procedure Laterality Date  . Cataract extraction, bilateral  left  1997/   right 2003  . Pacemaker insertion  12-06-2008  DR GREGG TAYLOR    BiV PPM  --  [employer]  . Colonoscopy  08/18/2006    LXB:WIOM granularity and erosions of the rectum of uncertain significance biopsied.  A long redundant, but otherwise normal-appearing colon.melanosi coli and minimal inflammation on bx  . Inguinal hernia repair Bilateral AS TEEN  . Interstim implant placement  2001  &  2007  . Cysto/ hod/  replacement interstim implant  05-14-2003  . Removal interstim implant/  cyst/  hod  04-14-2004  . Revision interstim implant and replace  generator  05-15-2009    left upper buttock for urge urinary incontinence  . Wide excision left heel and sln bx  JUNE 2012  . Tonsillectomy  1950  . Left elbow surgery  2002  . Back surgery  X3  LAST ONE'90's  . Transthoracic echocardiogram  12-06-2008    LVSF 55-60%/  MODERATE MR/  MILD AR/  QUESTION DISTAL POSTERIOR HYPOKINESIS  . Interstim implant revision N/A 08/28/2013    Procedure: REPLACEMENT OF INTERSTIM-GENERATOR AND LEAD ;  Surgeon: [name], MD;  Location: [facility];  Service: Urology;  Laterality: N/A;  . Esophagogastroduodenoscopy N/A 03/28/2014    Dr. [name]: normal esophagus s/p dilation, mosaic appearance - chronic inflammation noted on bx  . Savory dilation N/A 03/28/2014    Procedure: SAVORY DILATION;  Surgeon: [name], MD;  [facility];  Service: Endoscopy;  Laterality: N/A;  Venia Minks dilation N/A 03/28/2014    Procedure: Venia Minks DILATION;  Surgeon: [name], MD;  [facility];  Service: Endoscopy;  Laterality: N/A;  . Colonoscopy N/A 08/05/2014    Procedure: COLONOSCOPY;  Surgeon: [name], MD;  [facility];  Service: Endoscopy;  Laterality: N/A;  PT NEEDS TCS AT 1000.  . Implantable cardioverter defibrillator (icd) generator change N/A 10/11/2014    Procedure: ICD GENERATOR CHANGE;  Surgeon: [name], MD;  Location: [facility] CATH LAB;  Service: Cardiovascular;  Laterality: N/A;    SOCIAL HISTORY: Social History   Social History  . Marital Status: Married    Spouse Name: N/A  . Number of Children: N/A  . Years of Education: N/A   Occupational History  . retired     Social History Main Topics  . Smoking status: Former Smoker -- 0.02 packs/day    Types: Cigarettes    Quit date: 08/24/1997  . Smokeless tobacco: Never Used  . Alcohol Use: No  . Drug Use: No  . Sexual Activity:    Partners: Male   Other Topics Concern  . 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 Dementia Mother   . Throat cancer Father   . Cancer Father 86    throat  . Lung cancer Sister   . Heart disease Brother    indicated that his mother is deceased. He indicated that his father is deceased. He indicated that two of his five sisters are alive. He indicated that only one of his two brothers is alive.   ALLERGIES:  is allergic to codeine and penicillins. Father deceased at 48, Head and Neck  Cancer Mother deceased at 7, Dementia 27 brothers and sisters; 3 brothers deceased, 2 sisters deceased    MEDICATIONS:  Current Outpatient Prescriptions  Medication Sig Dispense Refill  . amitriptyline (ELAVIL) 10 MG tablet Take 10 mg by mouth at bedtime.    Marland Kitchen amLODipine (NORVASC) 2.5 MG tablet Take 1 tablet (2.5 mg total) by mouth daily. 30 tablet 3  . dorzolamide-timolol (COSOPT) 22.3-6.8 MG/ML ophthalmic solution Place 1 drop into both eyes 2 (two) times daily.     . fentaNYL (DURAGESIC - DOSED MCG/HR) 25 MCG/HR patch Place 1 patch (25 mcg total) onto the skin every 3 (three) days. 10 patch 0  . hydrochlorothiazide (HYDRODIURIL) 25 MG tablet Take 25 mg by mouth daily.    Marland Kitchen latanoprost (XALATAN) 0.005 % ophthalmic solution Place 1 drop into both eyes at bedtime.     . Meth-Hyo-M Bl-Na Phos-Ph Sal (URIBEL) 118 MG CAPS Take 118 mg by mouth 3 (three) times daily as needed (bladder spasms).     . polyethylene glycol powder (GLYCOLAX/MIRALAX) powder Take 17 g by mouth daily. 3350 g 1  . traMADol (ULTRAM) 50 MG tablet Take 1 tablet (50 mg total) by mouth daily as needed for moderate pain. (Patient not taking: Reported on 07/04/2015) 30 tablet 2   No current facility-administered medications for this visit.    Review of Systems  Constitutional: Negative.        In wheelchair.  HENT: Negative.   Eyes:       Weakening  Respiratory: Negative.     Cardiovascular: Negative.   Gastrointestinal: Negative.   Musculoskeletal:       Weak legs      Back pain  Skin: Negative.   Neurological: Negative.   Endo/Heme/Allergies: Negative.   Psychiatric/Behavioral: Negative.   All other systems reviewed and are negative.  14 point ROS was done and is otherwise as detailed above or in HPI   PHYSICAL EXAMINATION: ECOG PERFORMANCE STATUS: 2 - Symptomatic, <50% confined to bed  Filed Vitals:   07/04/15 1300  BP: 115/87  Pulse: 71  Temp: 97.8 F (36.6 C)  Resp: 18   Filed Weights   07/04/15 1300  Weight: 158 lb 12.8 oz (72.031 kg)     Physical Exam  Constitutional: He is oriented to person, place, and time and well-developed, well-nourished, and in no distress.  Talkative today, pleasant,  HENT:  Head: Normocephalic and atraumatic.  Eyes:  L eye, lazy  Neck: Normal range of motion. Neck supple.  Cardiovascular: Normal rate and regular rhythm.  Rare ectopy Palpable pacemaker, R chest Diastolic murmur  Pulmonary/Chest: Effort normal and breath sounds normal.  Breathing sounds good today. Abdominal: Soft. Bowel sounds are normal.  Musculoskeletal: Normal range of motion.  Ulnar deviation in both hands  Neurological: He is alert and oriented to person, place, and time. More interactive and appropriate in our conversation. Skin: Skin is warm and dry.    LABORATORY DATA:  I have reviewed the data as listed  Iron/TIBC/Ferritin/ %Sat    Component Value Date/Time   IRON 61 05/27/2015 1501   TIBC 364 05/27/2015 1501   FERRITIN 51 05/27/2015 1501   IRONPCTSAT 17* 05/27/2015 1501   CBC    Component Value Date/Time   WBC 2.0* 07/04/2015 1308   RBC 3.23* 07/04/2015 1308   RBC 3.29* 05/27/2015 1501   HGB 10.6* 07/04/2015 1308   HCT 31.1* 07/04/2015 1308   PLT 82* 07/04/2015 1308   MCV 96.3 07/04/2015 1308   MCH 32.8  07/04/2015 1308   MCHC 34.1 07/04/2015 1308   RDW 14.2 07/04/2015 1308   LYMPHSABS 0.4* 07/04/2015 1308    MONOABS 0.2 07/04/2015 1308   EOSABS 0.1 07/04/2015 1308   BASOSABS 0.0 07/04/2015 1308   CMP     Component Value Date/Time   NA 140 05/27/2015 1501   K 3.8 05/27/2015 1501   CL 105 05/27/2015 1501   CO2 29 05/27/2015 1501   GLUCOSE 126* 05/27/2015 1501   BUN 23* 05/27/2015 1501   CREATININE 1.06 05/27/2015 1501   CREATININE 1.07 05/12/2015 1338   CALCIUM 8.5* 05/27/2015 1501   PROT 6.4* 05/27/2015 1501   ALBUMIN 3.6 05/27/2015 1501   AST 24 05/27/2015 1501   ALT 16* 05/27/2015 1501   ALKPHOS 52 05/27/2015 1501   BILITOT 1.2 05/27/2015 1501   GFRNONAA >60 05/27/2015 1501   GFRAA >60 05/27/2015 1501   Results for BRENNON, OTTERNESS (MRN [id number]) as of 07/01/2015 07:45  Ref. Range 05/27/2015 15:01 05/27/2015 15:01 05/27/2015 15:01  Sodium Latest Ref Range: 135-145 mmol/L 140    Potassium Latest Ref Range: 3.5-5.1 mmol/L 3.8    Chloride Latest Ref Range: 101-111 mmol/L 105    CO2 Latest Ref Range: 22-32 mmol/L 29    BUN Latest Ref Range: 6-20 mg/dL 23 (H)    Creatinine Latest Ref Range: 0.61-1.24 mg/dL 1.06    Calcium Latest Ref Range: 8.9-10.3 mg/dL 8.5 (L)    EGFR (Non-African Amer.) Latest Ref Range: >60 mL/min >60    EGFR (African American) Latest Ref Range: >60 mL/min >60    Glucose Latest Ref Range: 65-99 mg/dL 126 (H)    Anion gap Latest Ref Range: 5-15  6    Alkaline Phosphatase Latest Ref Range: 38-126 U/L 52    Albumin Latest Ref Range: 3.5-5.0 g/dL 3.6    AST Latest Ref Range: 15-41 U/L 24    ALT Latest Ref Range: 17-63 U/L 16 (L)    Total Protein Latest Ref Range: 6.5-8.1 g/dL 6.4 (L)    Total Bilirubin Latest Ref Range: 0.3-1.2 mg/dL 1.2    LDH Latest Ref Range: 98-192 U/L 269 (H)    Iron Latest Ref Range: 45-182 ug/dL 61    UIBC Latest Units: ug/dL 303    TIBC Latest Ref Range: 250-450 ug/dL 364    Saturation Ratios Latest Ref Range: 17.9-39.5 % 17 (L)    Ferritin Latest Ref Range: 24-336 ng/mL 51    Folate Latest Ref Range: >5.9 ng/mL 15.6    CRP Latest Ref  Range: <1.0 mg/dL <0.5    Vitamin B-12 Latest Ref Range: 180-914 pg/mL 662    Total Protein ELP Latest Ref Range: 6.0-8.5 g/dL  7.0 6.8  Albumin ELP Latest Ref Range: 2.9-4.4 g/dL  3.8   Globulin, Total Latest Ref Range: 2.2-3.9 g/dL  3.2   A/G Ratio Latest Ref Range: 0.7-1.7   1.2   Alpha-1 Glubulin Latest Ref Range: 0.0-0.4 g/dL  0.2   Alpha-2 Globulin Latest Ref Range: 0.4-1.0 g/dL  0.6   Beta Globulin Latest Ref Range: 0.7-1.3 g/dL  1.0   Gamma Globulin Latest Ref Range: 0.4-1.8 g/dL  1.3   M-SPIKE, % Latest Ref Range: Not Observed g/dL  Not Observed   SPE Interp. Unknown  Comment   Comment Unknown  Comment   IgG (Immunoglobin G), Serum Latest Ref Range: 575-287-5649 mg/dL 1098  1163  IgA Latest Ref Range: 61-437 mg/dL 223  238  IgM, Serum Latest Ref Range: 15-143 mg/dL 133  143  WBC Latest  Ref Range: 4.0-10.5 K/uL 1.8 (L)    RBC Latest Ref Range: 4.22-5.81 MIL/uL 3.29 (L)    Hemoglobin Latest Ref Range: 13.0-17.0 g/dL 10.7 (L)    HCT Latest Ref Range: 39.0-52.0 % 31.5 (L)    MCV Latest Ref Range: 78.0-100.0 fL 95.7    MCH Latest Ref Range: 26.0-34.0 pg 32.5    MCHC Latest Ref Range: 30.0-36.0 g/dL 34.0    RDW Latest Ref Range: 11.5-15.5 % 14.1    Platelets Latest Ref Range: 150-400 K/uL 82 (L)    Neutrophils Latest Ref Range: 43-77 % 67    Lymphocytes Latest Ref Range: 12-46 % 20    Monocytes Relative Latest Ref Range: 3-12 % 8    Eosinophil Latest Ref Range: 0-5 % 4    Basophil Latest Ref Range: 0-1 % 1    NEUT# Latest Ref Range: 1.7-7.7 K/uL 1.2 (L)    Lymphocyte # Latest Ref Range: 0.7-4.0 K/uL 0.4 (L)    Monocyte # Latest Ref Range: 0.1-1.0 K/uL 0.1    Eosinophils Absolute Latest Ref Range: 0.0-0.7 K/uL 0.1    Basophils Absolute Latest Ref Range: 0.0-0.1 K/uL 0.0    RBC. Latest Ref Range: 4.22-5.81 MIL/uL 3.29 (L)    Retic Ct Pct Latest Ref Range: 0.4-3.1 % 0.8    Retic Count, Manual Latest Ref Range: 19.0-186.0 K/uL 26.3    Haptoglobin Latest Ref Range: 34-200 mg/dL <10  (L)    Erythropoietin Latest Ref Range: 2.6-18.5 mIU/mL 29.7 (H)    Sed Rate Latest Ref Range: 0-16 mm/hr 9    Testosterone Latest Ref Range: 786-483-2866 ng/dL 197 (L)    Testosterone Free Latest Ref Range: 6.6-18.1 pg/mL 1.6 (L)    TSH Latest Ref Range: 0.350-4.500 uIU/mL 2.875    Immunofixation Result, Serum Unknown   Comment    Pathology review with pancytopenia TERPRETATION Interpretation Peripheral Blood Flow Cytometry - PREDOMINANCE OF T CELLS WITH NO ABNORMAL PHENOTYPE. - MINOR B-CELL POPULATION PRESENT. - SEE NOTE. Diagnosis Comment: Analysis of the lymphoid population shows predominance of T cells without an aberrant phenotype. B cells represent the minor population (less than 10% of lymphocytes) with expression of pan B cell antigens but with dim/negative staining for surface immunoglobulin light chains that hinder assessment and/or quantitation of clonality. However, an abnormal B-cell phenotype such as co-expression of CD5 and CD20 or CD10 expression is not identified. Clinical correlation is recommended. 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 Decreased iron stores were noted. Cytogenetics showed 20% of the nuclei positive by fish with a 22 q deletion. No monosomy 7 is present. Erythopoietin Level 29.7 mIU/ml Hypogonadism Low serum iron with ferritin of 51 ng/ml Low haptoglobin, mild elevation of LDH, normal bilirubin   Because of the patient's fatigue is multifactorial including his MDS and hypogonadism, cardiac status, deconditioning and age. His blood counts are  reasonable. If he becomes more anemic he would be a candidate for a trial of growth factor support. If his white blood count becomes an issue, with significant neutropenia and/or infection he would also be a candidate for growth factor support. Currently I have recommended observation of his blood counts.  He actually is doing better today. He is engaged, talkative, sitting in a chair and admits to having somewhat better energy. I emphasized to him that we have to set reasonable goals in getting him back to the point he was several years ago may not be reasonable. He states he understands that. He will continue to work with physical therapy at home. I will call the patient and his wife when his zinc and copper levels returned. We will see him back again in 8 weeks with repeat labs including a ferritin level.  Given his complaints about his pacemaker, I recommend that [name] follow up with Dr. Lovena Le; I can make an appointment. He should at least call and follow-up with his nurse.  I will see [name] back in a few months.  All questions were answered. The patient knows to call the clinic with any problems, questions or concerns.  This document serves as a record of services personally performed by Ancil Linsey, MD. It was created on her behalf by Toni Amend, a trained medical scribe. The creation of this record is based on the scribe's personal observations and the provider's statements to them. This document has been checked and approved by the attending provider.  I have reviewed the above documentation for accuracy and completeness, and I agree with the above.  This note was electronically signed.  Kelby Fam. Whitney Muse, MD

## 2015-07-05 LAB — COPPER, SERUM: COPPER: 119 ug/dL (ref 72–166)

## 2015-07-05 LAB — ZINC: ZINC: 72 ug/dL (ref 56–134)

## 2015-07-07 DIAGNOSIS — Z950 Presence of cardiac pacemaker: Secondary | ICD-10-CM | POA: Diagnosis not present

## 2015-07-07 DIAGNOSIS — Z9181 History of falling: Secondary | ICD-10-CM | POA: Diagnosis not present

## 2015-07-07 DIAGNOSIS — D469 Myelodysplastic syndrome, unspecified: Secondary | ICD-10-CM | POA: Diagnosis not present

## 2015-07-07 DIAGNOSIS — R296 Repeated falls: Secondary | ICD-10-CM | POA: Diagnosis not present

## 2015-07-07 DIAGNOSIS — G309 Alzheimer's disease, unspecified: Secondary | ICD-10-CM | POA: Diagnosis not present

## 2015-07-07 DIAGNOSIS — R2689 Other abnormalities of gait and mobility: Secondary | ICD-10-CM | POA: Diagnosis not present

## 2015-07-09 DIAGNOSIS — R296 Repeated falls: Secondary | ICD-10-CM | POA: Diagnosis not present

## 2015-07-09 DIAGNOSIS — Z9181 History of falling: Secondary | ICD-10-CM | POA: Diagnosis not present

## 2015-07-09 DIAGNOSIS — G309 Alzheimer's disease, unspecified: Secondary | ICD-10-CM | POA: Diagnosis not present

## 2015-07-09 DIAGNOSIS — Z950 Presence of cardiac pacemaker: Secondary | ICD-10-CM | POA: Diagnosis not present

## 2015-07-09 DIAGNOSIS — D469 Myelodysplastic syndrome, unspecified: Secondary | ICD-10-CM | POA: Diagnosis not present

## 2015-07-09 DIAGNOSIS — R2689 Other abnormalities of gait and mobility: Secondary | ICD-10-CM | POA: Diagnosis not present

## 2015-07-09 NOTE — Assessment & Plan Note (Signed)
Controlled, no change in medication  

## 2015-07-09 NOTE — Assessment & Plan Note (Signed)
Increasing behavioral problems,, does not taking medication regularly, and unwilling to do so, increasing need for help at home

## 2015-07-09 NOTE — Assessment & Plan Note (Signed)
Home safety reviewed, and importance of fall prevention stressed, including use of assistive device

## 2015-07-09 NOTE — Assessment & Plan Note (Signed)
Continue current medication.

## 2015-07-09 NOTE — Assessment & Plan Note (Signed)
Followed by urology has implanted device as well as is on medication for symptoms, still c/o excessive nocturia and wants to stop his diuretic, the importance of maintaining medications as prescribed for heart failure protection is again discussed

## 2015-07-11 DIAGNOSIS — D469 Myelodysplastic syndrome, unspecified: Secondary | ICD-10-CM | POA: Diagnosis not present

## 2015-07-11 DIAGNOSIS — R296 Repeated falls: Secondary | ICD-10-CM | POA: Diagnosis not present

## 2015-07-11 DIAGNOSIS — Z950 Presence of cardiac pacemaker: Secondary | ICD-10-CM | POA: Diagnosis not present

## 2015-07-11 DIAGNOSIS — Z9181 History of falling: Secondary | ICD-10-CM | POA: Diagnosis not present

## 2015-07-11 DIAGNOSIS — R2689 Other abnormalities of gait and mobility: Secondary | ICD-10-CM | POA: Diagnosis not present

## 2015-07-11 DIAGNOSIS — G309 Alzheimer's disease, unspecified: Secondary | ICD-10-CM | POA: Diagnosis not present

## 2015-07-14 ENCOUNTER — Telehealth: Payer: Self-pay

## 2015-07-14 DIAGNOSIS — Z9181 History of falling: Secondary | ICD-10-CM | POA: Diagnosis not present

## 2015-07-14 DIAGNOSIS — G309 Alzheimer's disease, unspecified: Secondary | ICD-10-CM | POA: Diagnosis not present

## 2015-07-14 DIAGNOSIS — R296 Repeated falls: Secondary | ICD-10-CM | POA: Diagnosis not present

## 2015-07-14 DIAGNOSIS — D469 Myelodysplastic syndrome, unspecified: Secondary | ICD-10-CM | POA: Diagnosis not present

## 2015-07-14 DIAGNOSIS — Z950 Presence of cardiac pacemaker: Secondary | ICD-10-CM | POA: Diagnosis not present

## 2015-07-14 DIAGNOSIS — R2689 Other abnormalities of gait and mobility: Secondary | ICD-10-CM | POA: Diagnosis not present

## 2015-07-14 NOTE — Telephone Encounter (Signed)
PT states that he has been having chest pain & sob for 4-5 days now. States that it comes & goes, not really a pattern (rest, exertion) That he has not had to take a nitro- but states that he does not want to go to ED- although that is what I suggested that he do. He would rather wait to see dr tomorrow. Made appointment with PA tomorrow @1 .

## 2015-07-15 ENCOUNTER — Encounter: Payer: Self-pay | Admitting: Cardiology

## 2015-07-15 ENCOUNTER — Ambulatory Visit: Payer: Self-pay | Admitting: Physician Assistant

## 2015-07-16 ENCOUNTER — Ambulatory Visit (INDEPENDENT_AMBULATORY_CARE_PROVIDER_SITE_OTHER): Payer: Medicare Other | Admitting: Urology

## 2015-07-16 DIAGNOSIS — R300 Dysuria: Secondary | ICD-10-CM | POA: Diagnosis not present

## 2015-07-16 DIAGNOSIS — N3010 Interstitial cystitis (chronic) without hematuria: Secondary | ICD-10-CM

## 2015-07-16 DIAGNOSIS — N410 Acute prostatitis: Secondary | ICD-10-CM | POA: Diagnosis not present

## 2015-07-17 DIAGNOSIS — Z9181 History of falling: Secondary | ICD-10-CM | POA: Diagnosis not present

## 2015-07-17 DIAGNOSIS — D469 Myelodysplastic syndrome, unspecified: Secondary | ICD-10-CM | POA: Diagnosis not present

## 2015-07-17 DIAGNOSIS — R296 Repeated falls: Secondary | ICD-10-CM | POA: Diagnosis not present

## 2015-07-17 DIAGNOSIS — G309 Alzheimer's disease, unspecified: Secondary | ICD-10-CM | POA: Diagnosis not present

## 2015-07-17 DIAGNOSIS — R2689 Other abnormalities of gait and mobility: Secondary | ICD-10-CM | POA: Diagnosis not present

## 2015-07-17 DIAGNOSIS — Z950 Presence of cardiac pacemaker: Secondary | ICD-10-CM | POA: Diagnosis not present

## 2015-07-18 ENCOUNTER — Other Ambulatory Visit (HOSPITAL_COMMUNITY): Payer: Self-pay

## 2015-07-18 ENCOUNTER — Ambulatory Visit (HOSPITAL_COMMUNITY): Payer: Self-pay | Admitting: Oncology

## 2015-07-18 ENCOUNTER — Encounter: Payer: Self-pay | Admitting: Adult Health

## 2015-07-18 ENCOUNTER — Ambulatory Visit (INDEPENDENT_AMBULATORY_CARE_PROVIDER_SITE_OTHER): Payer: Medicare Other | Admitting: Adult Health

## 2015-07-18 VITALS — BP 118/74 | HR 84 | Ht 71.0 in | Wt 159.2 lb

## 2015-07-18 DIAGNOSIS — R0602 Shortness of breath: Secondary | ICD-10-CM

## 2015-07-18 DIAGNOSIS — I10 Essential (primary) hypertension: Secondary | ICD-10-CM | POA: Diagnosis not present

## 2015-07-18 DIAGNOSIS — I420 Dilated cardiomyopathy: Secondary | ICD-10-CM

## 2015-07-18 DIAGNOSIS — I059 Rheumatic mitral valve disease, unspecified: Secondary | ICD-10-CM

## 2015-07-18 NOTE — Progress Notes (Signed)
Name: [name]    DOB: May 31, 1926  Age: 81 y.o.  MR#: [id number]       PCP:  [name], MD      Insurance: Payor: MEDICARE / Plan: MEDICARE PART A AND B / Product Type: *No Product type* /   CC:    Chief Complaint  Patient presents with  . Congestive Heart Failure  . Hypertension    VS Filed Vitals:   07/18/15 1427  BP: 118/74  Pulse: 84  Height: 5\' 11"  (1.803 m)  Weight: 159 lb 3.2 oz (72.213 kg)  SpO2: 93%    Weights Current Weight  07/18/15 159 lb 3.2 oz (72.213 kg)  07/04/15 158 lb 12.8 oz (72.031 kg)  06/13/15 157 lb (71.215 kg)    Blood Pressure  BP Readings from Last 3 Encounters:  07/18/15 118/74  07/04/15 115/87  06/27/15 134/83     Admit date:  (Not on file) Last encounter with RMR:  03/18/2015   Allergy Codeine and Penicillins  Current Outpatient Prescriptions  Medication Sig Dispense Refill  . amitriptyline (ELAVIL) 10 MG tablet Take 10 mg by mouth at bedtime.    Marland Kitchen amLODipine (NORVASC) 2.5 MG tablet Take 1 tablet (2.5 mg total) by mouth daily. 30 tablet 3  . dorzolamide-timolol (COSOPT) 22.3-6.8 MG/ML ophthalmic solution Place 1 drop into both eyes 2 (two) times daily.     . fentaNYL (DURAGESIC - DOSED MCG/HR) 25 MCG/HR patch Place 1 patch (25 mcg total) onto the skin every 3 (three) days. 10 patch 0  . hydrochlorothiazide (HYDRODIURIL) 25 MG tablet Take 25 mg by mouth daily.    Marland Kitchen latanoprost (XALATAN) 0.005 % ophthalmic solution Place 1 drop into both eyes at bedtime.     . Meth-Hyo-M Bl-Na Phos-Ph Sal (URIBEL) 118 MG CAPS Take 118 mg by mouth 3 (three) times daily as needed (bladder spasms).     . polyethylene glycol powder (GLYCOLAX/MIRALAX) powder Take 17 g by mouth daily. 3350 g 1  . traMADol (ULTRAM) 50 MG tablet Take 1 tablet (50 mg total) by mouth daily as needed for moderate pain. 30 tablet 2   No current facility-administered medications for this visit.    Discontinued Meds:   There are no discontinued medications.  Patient Active  Problem List   Diagnosis Date Noted  . Myelodysplasia 06/20/2015  . Dyspnea 02/09/2015  . Recurrent falls 08/01/2014  . Atrial fibrillation, persistent ([place]) 02/22/2014  . Bilateral leg weakness 05/01/2013  . Abnormal gait 06/12/2012  . GERD (gastroesophageal reflux disease) 05/09/2012  . Alzheimer's dementia 11/17/2011  . Backache 02/18/2010  . GEN OSTEOARTHROSIS INVOLVING MULTIPLE SITES 11/25/2009  . CENTRAL HEARING LOSS 11/10/2009  . CARDIAC PACEMAKER IN SITU 07/22/2009  . INTERSTITIAL CYSTITIS 07/14/2009  . Pancytopenia ([place]) 12/31/2008  . MITRAL REGURGITATION, MODERATE 12/31/2008  . Chronic systolic heart failure ([place]) 12/31/2008  . ERECTILE DYSFUNCTION 01/31/2008  . Glaucoma 01/31/2008  . Essential hypertension 01/31/2008    LABS    Component Value Date/Time   NA 140 05/27/2015 1501   NA 140 05/12/2015 1338   NA 143 01/27/2015 1533   K 3.8 05/27/2015 1501   K 3.5 05/12/2015 1338   K 3.8 01/27/2015 1533   CL 105 05/27/2015 1501   CL 104 05/12/2015 1338   CL 103 01/27/2015 1533   CO2 29 05/27/2015 1501   CO2 27 05/12/2015 1338   CO2 29 01/27/2015 1533   GLUCOSE 126* 05/27/2015 1501   GLUCOSE 108* 05/12/2015 1338   GLUCOSE 102* 01/27/2015  1533   BUN 23* 05/27/2015 1501   BUN 17 05/12/2015 1338   BUN 24* 01/27/2015 1533   CREATININE 1.06 05/27/2015 1501   CREATININE 1.07 05/12/2015 1338   CREATININE 1.28 01/27/2015 1533   CREATININE 0.96 01/06/2015 1955   CREATININE 0.99 12/30/2014 1641   CREATININE 1.16 08/04/2014 0622   CALCIUM 8.5* 05/27/2015 1501   CALCIUM 8.7 05/12/2015 1338   CALCIUM 8.9 01/27/2015 1533   GFRNONAA >60 05/27/2015 1501   GFRNONAA 73* 01/06/2015 1955   GFRNONAA 56* 08/04/2014 0622   GFRAA >60 05/27/2015 1501   GFRAA 85* 01/06/2015 1955   GFRAA 64* 08/04/2014 0622   CMP     Component Value Date/Time   NA 140 05/27/2015 1501   K 3.8 05/27/2015 1501   CL 105 05/27/2015 1501   CO2 29 05/27/2015 1501   GLUCOSE 126* 05/27/2015 1501    BUN 23* 05/27/2015 1501   CREATININE 1.06 05/27/2015 1501   CREATININE 1.07 05/12/2015 1338   CALCIUM 8.5* 05/27/2015 1501   PROT 6.4* 05/27/2015 1501   ALBUMIN 3.6 05/27/2015 1501   AST 24 05/27/2015 1501   ALT 16* 05/27/2015 1501   ALKPHOS 52 05/27/2015 1501   BILITOT 1.2 05/27/2015 1501   GFRNONAA >60 05/27/2015 1501   GFRAA >60 05/27/2015 1501       Component Value Date/Time   WBC 2.0* 07/04/2015 1308   WBC 1.8* 05/27/2015 1501   WBC 2.3* 05/12/2015 1338   HGB 10.6* 07/04/2015 1308   HGB 10.7* 05/27/2015 1501   HGB 11.0* 05/12/2015 1338   HCT 31.1* 07/04/2015 1308   HCT 31.5* 05/27/2015 1501   HCT 32.2* 05/12/2015 1338   MCV 96.3 07/04/2015 1308   MCV 95.7 05/27/2015 1501   MCV 93.9 05/12/2015 1338    Lipid Panel     Component Value Date/Time   CHOL 179 06/19/2013 1241   TRIG 66 06/19/2013 1241   HDL 51 06/19/2013 1241   CHOLHDL 3.5 06/19/2013 1241   VLDL 13 06/19/2013 1241   LDLCALC 115* 06/19/2013 1241    ABG No results found for: PHART, PCO2ART, PO2ART, HCO3, TCO2, ACIDBASEDEF, O2SAT   Lab Results  Component Value Date   TSH 2.875 05/27/2015   BNP (last 3 results)  Recent Labs  01/06/15 1955  BNP 664.0*    ProBNP (last 3 results) No results for input(s): PROBNP in the last 8760 hours.  Cardiac Panel (last 3 results) No results for input(s): CKTOTAL, CKMB, TROPONINI, RELINDX in the last 72 hours.  Iron/TIBC/Ferritin/ %Sat    Component Value Date/Time   IRON 61 05/27/2015 1501   TIBC 364 05/27/2015 1501   FERRITIN 51 05/27/2015 1501   IRONPCTSAT 17* 05/27/2015 1501     EKG Orders placed or performed in visit on 07/18/15  . EKG 12-Lead     Prior Assessment and Plan Problem List as of 07/18/2015      Cardiovascular and Mediastinum   MITRAL REGURGITATION, MODERATE   Last Assessment & Plan 08/08/2014 Office Visit Written 08/08/2014  4:06 PM by Lendon Colonel, NP    No apparent for repair or surgery at this time. Multiple  comorbidities. Continue medical management.      Essential hypertension   Last Assessment & Plan 06/12/2015 Office Visit Written 07/09/2015  8:20 PM by Fayrene Helper, MD    Controlled, no change in medication       Chronic systolic heart failure [facility])   Last Assessment & Plan 05/05/2015 Office Visit Written 05/05/2015 11:28  AM by [name], MD    His symptoms are class 2B. He is intolerant of beta blockers (fatigue) and ACE/ARB (renal insuff). He is encouraged to reduce his sodium intake. Will follow. He appears euvolemic today and his optivol confirms this.      Atrial fibrillation, persistent [facility])   Last Assessment & Plan 05/05/2015 Office Visit Written 05/05/2015 11:26 AM by [name], MD    He is in and out of NSR, mostly in rhythm. He unfortunately cannot take any anti-coagulation due to GI bleeding. He does not feel palpitations.         Digestive   GERD (gastroesophageal reflux disease)   Last Assessment & Plan 09/03/2013 Office Visit Written 09/09/2013  2:31 PM by Fayrene Helper, MD    Controlled, no change in medication Followed by GI        Nervous and Auditory   CENTRAL HEARING LOSS   Last Assessment & Plan 12/28/2013 Office Visit Written 12/29/2013  9:11 AM by Fayrene Helper, MD    needs hearing aids, will advise checking belltone  For help with this      Alzheimer's dementia   Last Assessment & Plan 06/12/2015 Office Visit Written 07/09/2015  8:29 PM by Fayrene Helper, MD    Increasing behavioral problems,, does not taking medication regularly, and unwilling to do so, increasing need for help at home      Bilateral leg weakness   Last Assessment & Plan 09/09/2014 Office Visit Written 02/02/2015 10:59 AM by Fayrene Helper, MD    Improved with recent in house therapy, he is to continue out pt in home PT/OT  For next approx 4 weeks      Myelodysplasia     Musculoskeletal and [place] 06/12/2015 Office Visit Written 07/09/2015  8:31 PM by Fayrene Helper, MD    Continue current medication        Genitourinary   INTERSTITIAL CYSTITIS   Last Assessment & Plan 06/12/2015 Office Visit Written 07/09/2015  8:36 PM by Fayrene Helper, MD    Followed by urology has implanted device as well as is on medication for symptoms, still c/o excessive nocturia and wants to stop his diuretic, the importance of maintaining medications as prescribed for heart failure protection is again discussed        Hematopoietic and Hemostatic   Pancytopenia [facility])   Last Assessment & Plan 03/01/2013 Office Visit Written 03/03/2013  3:04 PM by Fayrene Helper, MD    Improved numbers, followed by hematology        Other   ERECTILE DYSFUNCTION   Last Assessment & Plan 05/10/2011 Office Visit Written 05/10/2011 10:00 AM by Fayrene Helper, MD    Pt states that the cialis is not working as well, he still has a lot, advised him to discuss further with his urologist      Glaucoma   Last Assessment & Plan 02/25/2015 Office Visit Written 04/04/2015 11:06 PM by Fayrene Helper, MD    Followed by opthalmology and compliant with drops      Backache   Last Assessment & Plan 02/25/2015 Office Visit Written 04/04/2015 11:07 PM by Fayrene Helper, MD    Pt has spinal stenosis, encouraged regular use as prescribed of his chronic pain medication      CARDIAC PACEMAKER IN SITU   Last Assessment & Plan 05/05/2015 Office Visit Written  05/05/2015 11:29 AM by [name], MD    His medtronic BiV PPM is working normally. Will recheck in several months.       Abnormal gait   Recurrent falls   Last Assessment & Plan 06/12/2015 Office Visit Written 07/09/2015  8:34 PM by Fayrene Helper, MD    Home safety reviewed, and importance of fall prevention stressed, including use of assistive device      Dyspnea   Last Assessment & Plan 01/06/2015 Office Visit Written 02/09/2015  5:28 PM by Fayrene Helper, MD     Recent increase with worsened  exercise tolerance and recurrent falls, pt sent to [facility] for further eval          Imaging: No results found.

## 2015-07-18 NOTE — Patient Instructions (Signed)
Your physician wants you to follow-up in: 6 months with Arnold Long NP You will receive a reminder letter in the mail two months in advance. If you don't receive a letter, please call our office to schedule the follow-up appointment.      Your physician has requested that you have an echocardiogram. Echocardiography is a painless test that uses sound waves to create images of your heart. It provides your doctor with information about the size and shape of your heart and how well your heart's chambers and valves are working. This procedure takes approximately one hour. There are no restrictions for this procedure.      Please call with medication list     Thank you for choosing [facility] !

## 2015-07-18 NOTE — Progress Notes (Signed)
Cardiology Office Note   Date:  07/18/2015   ID:  [name], DOB November 27, 1925, MRN [id number]  PCP:  [name], MD  Cardiologist:  Bryna Colander, NP   No chief complaint on file.     History of Present Illness: [name] is a 81 y.o. male who presents for ongoing assessment and management of chronic systolic CHF, NICM, Hypertension, s/p BiV PPM followed by Dr. Lovena Le, atrial fib on amiodarone but no anticoagulant due to GIB. He is intolerant to BB, ACE/ARB due to renal insufficiency. He was hypertensive on last office visit.   He called our office on 07/14/2015 with complaints of chest pain and dyspnea for 4-5 days. He is here for assessment.   Echo 2010 Question distal posterior hypokinesis. Overall left ventricular    systolic function was normal. Left ventricular ejection    fraction was estimated , range being 55 % to 65 %. Question    distal posterior hypokinesis. - There was mild aortic valvular regurgitation. - Mitral valve is mildly thickened with moderate prolapse of the    anterior mitral leaflet with MR directed posteriorly along    wall of LA (1-2/4). There was mild to moderate mitral    valvular regurgitation.  He denies recurrent chest pain today. He has no idea what his medications are, and his wife, who is with him, states she doesn't remember either. They state that he is taking 1/2 of a fluid pill at home. His wife states that he eats a lot of salted foods. He has dementia and has visiting nurses to evaluate him every week or so.   Past Medical History  Diagnosis Date  . Chronic systolic heart failure   . Rash and other nonspecific skin eruption   . Costochondritis, acute   . Glaucoma   . Erectile dysfunction   . Visual changes   . Pancytopenia   . Chronic pancreatitis     Based on CT findings  . History of melanoma in situ     JAN 2012--  LEFT HEEL W/ SLN BX  . Chronic back pain   . IC (interstitial cystitis)    . BPH (benign prostatic hypertrophy)   . Cardiac pacemaker     CARDIOLOGIST-- DR Cristopher Peru (LAST PACE [facility] 05-15-2013)  . Asymptomatic carotid artery stenosis     BILATERAL MILD ICA---  <50% PER DUPLEX 03-08-2008  . History of syncope   . Hypertension   . CHB (complete heart block)   . Nonischemic cardiomyopathy   . Severe mitral regurgitation   . Urge urinary incontinence   . A-fib   . Dementia   . Alzheimer disease     Past Surgical History  Procedure Laterality Date  . Cataract extraction, bilateral  left  1997/   right 2003  . Pacemaker insertion  12-06-2008  DR GREGG TAYLOR    BiV PPM  --  [employer]  . Colonoscopy  08/18/2006    GLO:VFIE granularity and erosions of the rectum of uncertain significance biopsied.  A long redundant, but otherwise normal-appearing colon.melanosi coli and minimal inflammation on bx  . Inguinal hernia repair Bilateral AS TEEN  . Interstim implant placement  2001  &  2007  . Cysto/ hod/  replacement interstim implant  05-14-2003  . Removal interstim implant/  cyst/  hod  04-14-2004  . Revision interstim implant and replace generator  05-15-2009    left upper buttock for urge urinary incontinence  . Wide excision left heel and sln bx  JUNE 2012  . Tonsillectomy  1950  . Left elbow surgery  2002  . Back surgery  X3  LAST ONE'90's  . Transthoracic echocardiogram  12-06-2008    LVSF 55-60%/  MODERATE MR/  MILD AR/  QUESTION DISTAL POSTERIOR HYPOKINESIS  . Interstim implant revision N/A 08/28/2013    Procedure: REPLACEMENT OF INTERSTIM-GENERATOR AND LEAD ;  Surgeon: [name], MD;  Location: [place];  Service: Urology;  Laterality: N/A;  . Esophagogastroduodenoscopy N/A 03/28/2014    Dr. [name]: normal esophagus s/p dilation, mosaic appearance - chronic inflammation noted on bx  . Savory dilation N/A 03/28/2014    Procedure: SAVORY DILATION;  Surgeon: [name], MD;  [facility];  Service: Endoscopy;   Laterality: N/A;  Venia Minks dilation N/A 03/28/2014    Procedure: Venia Minks DILATION;  Surgeon: [name], MD;  Location: [facility];  Service: Endoscopy;  Laterality: N/A;  . Colonoscopy N/A 08/05/2014    Procedure: COLONOSCOPY;  Surgeon: [name], MD;  [facility];  Service: Endoscopy;  Laterality: N/A;  PT NEEDS TCS AT 1000.  . Implantable cardioverter defibrillator (icd) generator change N/A 10/11/2014    Procedure: ICD GENERATOR CHANGE;  Surgeon: [name], MD;  Location: [facility] CATH LAB;  Service: Cardiovascular;  Laterality: N/A;     Current Outpatient Prescriptions  Medication Sig Dispense Refill  . amitriptyline (ELAVIL) 10 MG tablet Take 10 mg by mouth at bedtime.    Marland Kitchen amLODipine (NORVASC) 2.5 MG tablet Take 1 tablet (2.5 mg total) by mouth daily. 30 tablet 3  . dorzolamide-timolol (COSOPT) 22.3-6.8 MG/ML ophthalmic solution Place 1 drop into both eyes 2 (two) times daily.     . fentaNYL (DURAGESIC - DOSED MCG/HR) 25 MCG/HR patch Place 1 patch (25 mcg total) onto the skin every 3 (three) days. 10 patch 0  . hydrochlorothiazide (HYDRODIURIL) 25 MG tablet Take 25 mg by mouth daily.    Marland Kitchen latanoprost (XALATAN) 0.005 % ophthalmic solution Place 1 drop into both eyes at bedtime.     . Meth-Hyo-M Bl-Na Phos-Ph Sal (URIBEL) 118 MG CAPS Take 118 mg by mouth 3 (three) times daily as needed (bladder spasms).     . polyethylene glycol powder (GLYCOLAX/MIRALAX) powder Take 17 g by mouth daily. 3350 g 1  . traMADol (ULTRAM) 50 MG tablet Take 1 tablet (50 mg total) by mouth daily as needed for moderate pain. (Patient not taking: Reported on 07/04/2015) 30 tablet 2   No current facility-administered medications for this visit.    Allergies:   Codeine and Penicillins    Social History:  The patient  reports that he quit smoking about 17 years ago. His smoking use included Cigarettes. He smoked 0.02 packs per day. He has never used smokeless tobacco. He reports that he does  not drink alcohol or use illicit drugs.   Family History:  The patient's family history includes Cancer (age of onset: 22) in his father; Dementia in his mother; Heart disease in his brother; Lung cancer in his sister; Throat cancer in his father.    ROS: All other systems are reviewed and negative. Unless otherwise mentioned in H&P    PHYSICAL EXAM: VS:  There were no vitals taken for this visit. , BMI There is no weight on file to calculate BMI. GEN: Well nourished, well developed, in no acute distress HEENT: normal Neck: no JVD, carotid bruits, or masses Cardiac: RRR; 2/6 holosystolic murmur at the apex,  rubs, or gallops,no edema  Respiratory:  clear to auscultation bilaterally, normal work of breathing GI: soft, nontender, nondistended, + BS Appears to have a blood blister on his abdomen.  MS: no deformity or atrophy Skin: warm and dry, no rash Neuro:  Strength and sensation are intact Psych: euthymic mood, full affect   EKG: The ekg ordered today demonstrates ventricular pacemaker. Rate of 65 bpm.   Recent Labs: 01/06/2015: B Natriuretic Peptide 664.0* 05/27/2015: ALT 16*; BUN 23*; Creatinine, Ser 1.06; Potassium 3.8; Sodium 140; TSH 2.875 07/04/2015: Hemoglobin 10.6*; Platelets 82*    Lipid Panel    Component Value Date/Time   CHOL 179 06/19/2013 1241   TRIG 66 06/19/2013 1241   HDL 51 06/19/2013 1241   CHOLHDL 3.5 06/19/2013 1241   VLDL 13 06/19/2013 1241   LDLCALC 115* 06/19/2013 1241      Wt Readings from Last 3 Encounters:  07/04/15 158 lb 12.8 oz (72.031 kg)  06/13/15 157 lb (71.215 kg)  06/12/15 154 lb 0.6 oz (69.872 kg)      ASSESSMENT AND PLAN:  1. NICM: He appears well compensated today despite dietary non-compliance. He apparently is only taking half of his fluid pill instead of the full dose. Wife states that his other doctor changed the dose, but she does not know which dose he is taking. She is to call us to let us know his medications. I will not  make any changes.   2. Harsh Mitral Valve murmur:  He has not had an echo in 6 years. I will have one completed for ongoing evaluation to direct medical management.  3. Hypertension: Well controlled.   4. Dementia; Both he and his wife suffer memory loss, with worsening memory in the patient. This makes it difficult to treat as he is uncertain what he takes and is non-compliant with his medications. PCP make need to consider SNF or assisted living for this patient and his wife for safety and health concerns.   5. Myelodysplagia: Followed by hematology just saw the end of Sept.    Current medicines are reviewed at length with the patient today.    Labs/ tests ordered today include: Echo  Orders Placed This Encounter  Procedures  . EKG 12-Lead     Disposition:   FU with 6 months  Signed, Jory Sims, NP  07/18/2015 2:27 PM    [facility] [address], [place], [place] [address] Phone: [phone]; Fax: [phone]

## 2015-07-21 ENCOUNTER — Telehealth: Payer: Self-pay

## 2015-07-21 NOTE — Telephone Encounter (Signed)
Medication list updated.

## 2015-07-21 NOTE — Telephone Encounter (Signed)
-----   Message from Lendon Colonel, NP sent at 07/21/2015  6:53 AM EDT ----- Regarding: RE: hctz dose Ok. Please change this in the medication list to correct dose.  ----- Message -----    From: Bernita Raisin, RN    Sent: 07/18/2015   4:28 PM      To: Lendon Colonel, NP Subject: hctz dose                                      Per wife, HHN spoke with Dr Moshe Cipro and told them to cut HCTZ in half.Pt takes 12.5 mg daily

## 2015-07-23 ENCOUNTER — Ambulatory Visit (HOSPITAL_COMMUNITY)
Admission: RE | Admit: 2015-07-23 | Discharge: 2015-07-23 | Disposition: A | Discharge status: Home / Self Care | Payer: Medicare Other | Source: Ambulatory Visit | Attending: Adult Health | Admitting: Adult Health

## 2015-07-23 DIAGNOSIS — I10 Essential (primary) hypertension: Secondary | ICD-10-CM | POA: Diagnosis not present

## 2015-07-23 DIAGNOSIS — R0602 Shortness of breath: Secondary | ICD-10-CM | POA: Diagnosis not present

## 2015-07-31 ENCOUNTER — Telehealth: Payer: Self-pay | Admitting: *Deleted

## 2015-07-31 NOTE — Telephone Encounter (Signed)
Called patient and left message for them to return call at the office   

## 2015-07-31 NOTE — Telephone Encounter (Signed)
Called x 2 and phone line was busy

## 2015-07-31 NOTE — Telephone Encounter (Signed)
Patient called stating he is having pain in his rectum it has been going on 8 days now patient has been using preporation H and it is not helping. Patient is also requesting a refill on pain patch Please advise

## 2015-08-01 MED ORDER — HYDROCORTISONE ACE-PRAMOXINE 1-1 % RE FOAM: 1.0000 | Freq: Two times a day (BID) | RECTAL | Status: DC

## 2015-08-01 NOTE — Telephone Encounter (Signed)
pls check with pharmacy what rx is available for piles, and call in twice daily topically for 5 days then as needed, may be proctofoam HC,  ??? pls ask

## 2015-08-01 NOTE — Telephone Encounter (Signed)
Medication sent to pharmacy.  Called and left message notifying patient.

## 2015-08-01 NOTE — Addendum Note (Signed)
Addended by: Denman George B on: 08/01/2015 04:55 PM   Modules accepted: Orders

## 2015-08-01 NOTE — Telephone Encounter (Signed)
Wanted something prescription strength for the rectal pain (thinks its coming from hemorhoids) sent to [facility] pharmacy. Preparation H not working

## 2015-08-04 ENCOUNTER — Encounter: Payer: Medicare Other | Admitting: *Deleted

## 2015-08-06 ENCOUNTER — Encounter: Payer: Self-pay | Admitting: Cardiology

## 2015-08-12 ENCOUNTER — Other Ambulatory Visit: Payer: Self-pay | Admitting: Urology

## 2015-08-19 DIAGNOSIS — M25561 Pain in right knee: Secondary | ICD-10-CM | POA: Diagnosis not present

## 2015-08-19 DIAGNOSIS — Z8679 Personal history of other diseases of the circulatory system: Secondary | ICD-10-CM | POA: Diagnosis not present

## 2015-08-19 DIAGNOSIS — M25511 Pain in right shoulder: Secondary | ICD-10-CM | POA: Diagnosis not present

## 2015-08-19 DIAGNOSIS — M79604 Pain in right leg: Secondary | ICD-10-CM | POA: Diagnosis not present

## 2015-08-20 ENCOUNTER — Ambulatory Visit: Payer: Self-pay | Admitting: Urology

## 2015-08-20 ENCOUNTER — Ambulatory Visit (INDEPENDENT_AMBULATORY_CARE_PROVIDER_SITE_OTHER): Payer: Medicare Other | Admitting: Urology

## 2015-08-20 DIAGNOSIS — N3010 Interstitial cystitis (chronic) without hematuria: Secondary | ICD-10-CM

## 2015-08-20 DIAGNOSIS — N411 Chronic prostatitis: Secondary | ICD-10-CM | POA: Diagnosis not present

## 2015-08-20 DIAGNOSIS — K5900 Constipation, unspecified: Secondary | ICD-10-CM

## 2015-08-20 DIAGNOSIS — R351 Nocturia: Secondary | ICD-10-CM | POA: Diagnosis not present

## 2015-08-21 ENCOUNTER — Other Ambulatory Visit: Payer: Self-pay

## 2015-08-21 MED ORDER — FENTANYL 25 MCG/HR TD PT72: 25.0000 ug | MEDICATED_PATCH | TRANSDERMAL | Status: DC

## 2015-08-22 DIAGNOSIS — H02831 Dermatochalasis of right upper eyelid: Secondary | ICD-10-CM | POA: Diagnosis not present

## 2015-08-22 DIAGNOSIS — Z961 Presence of intraocular lens: Secondary | ICD-10-CM | POA: Diagnosis not present

## 2015-08-22 DIAGNOSIS — H401112 Primary open-angle glaucoma, right eye, moderate stage: Secondary | ICD-10-CM | POA: Diagnosis not present

## 2015-08-22 DIAGNOSIS — H02834 Dermatochalasis of left upper eyelid: Secondary | ICD-10-CM | POA: Diagnosis not present

## 2015-08-22 DIAGNOSIS — H401123 Primary open-angle glaucoma, left eye, severe stage: Secondary | ICD-10-CM | POA: Diagnosis not present

## 2015-09-01 ENCOUNTER — Other Ambulatory Visit (HOSPITAL_COMMUNITY): Payer: Self-pay

## 2015-09-02 NOTE — Assessment & Plan Note (Deleted)
Pancytopenia in the setting of a bone marrow aspiration and biopsy on 0000000 by Dr. Sterling Big at [facility] demonstrating [place] with 22q deletion, normocellular marrow at 25% without evidence of melanoma, lymphoma, or morphologic evidence of MDS.  However, decreased iron stores were noted.  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  Labs today:  Orders Placed This Encounter  Procedures  . CBC with Differential  . Comprehensive metabolic panel  . Lactate dehydrogenase  . Sedimentation rate  . C-reactive protein  . Erythropoietin  . Reticulocytes  . Iron and TIBC  . Ferritin    At this time, there is no need for ESA therapy.  However, if he anemia progresses, we could absolutely consider ESA treatment  Leukopenia is noted.  Recent infections or hospitalizations associated with infections.  If this becomes an issue GSF support can be considered.  Not necessary now.  Return in ~8 weeks for follow-up.

## 2015-09-02 NOTE — Progress Notes (Addendum)
Brian Nakayama, MD [address], [address] [place] [address]  Pancytopenia [facility]) - Plan: CBC with Differential, Comprehensive metabolic panel, Lactate dehydrogenase, Sedimentation rate, C-reactive protein, Erythropoietin, Reticulocytes, Iron and TIBC, Ferritin, diclofenac sodium (VOLTAREN) 1 % GEL, CBC with Differential, Comprehensive metabolic panel  Myelodysplasia - Plan: CBC with Differential, Comprehensive metabolic panel, Lactate dehydrogenase, Sedimentation rate, C-reactive protein, Erythropoietin, Reticulocytes, Iron and TIBC, Ferritin, diclofenac sodium (VOLTAREN) 1 % GEL, CBC with Differential, Comprehensive metabolic panel  CURRENT THERAPY: Observation  INTERVAL HISTORY: [name] 82 y.o. male returns for followup of pancytopenia in the setting of a bone marrow aspiration and biopsy on 0000000 by Dr. Sterling Big at [facility] demonstrating MDS with 22q deletion, normocellular marrow at 25% without evidence of melanoma, lymphoma, or morphologic evidence of MDS.  However, decreased iron stores were noted.  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  I personally reviewed and went over laboratory results with the patient.  The results are noted within this dictation.  Labs will be updated today.  He denies any hospitalizations and antibiotic needs.  He denies any B symptoms.  He reports right eye vision changes.  We spent a lot of time discussing this despite my deferring to his ophthalmologist.  He is educated that this is unrelated to his bone marrow issue.  However, I found during discussion, we kept getting back to his right eye vision.  He shows me that his small finger nail fell off and he used nail clipper to help with it snagging on clothing, etc.  He denies any trauma.  His fingernails are dirty.  He reports that he is burning wood [place]ing [place].    Past Medical History    Diagnosis Date  . Chronic systolic heart failure ([place])   . Rash and other nonspecific skin eruption   . Costochondritis, acute   . Glaucoma   . Erectile dysfunction   . Visual changes   . Pancytopenia   . Chronic pancreatitis ([place])     Based on CT findings  . History of melanoma in situ     JAN 2012--  LEFT HEEL W/ SLN BX  . Chronic back pain   . IC (interstitial cystitis)   . BPH (benign prostatic hypertrophy)   . Cardiac pacemaker     CARDIOLOGIST-- DR Cristopher Peru (LAST PACE [facility] 05-15-2013)  . Asymptomatic carotid artery stenosis     BILATERAL MILD ICA---  <50% PER DUPLEX 03-08-2008  . History of syncope   . Hypertension   . CHB (complete heart block) ([place])   . Nonischemic cardiomyopathy ([place])   . Severe mitral regurgitation   . Urge urinary incontinence   . A-fib ([place])   . Dementia   . Alzheimer disease     has Pancytopenia ([place]); ERECTILE DYSFUNCTION; Glaucoma; CENTRAL HEARING LOSS; MITRAL REGURGITATION, MODERATE; Essential hypertension; Chronic systolic heart failure ([place]); INTERSTITIAL CYSTITIS; GEN OSTEOARTHROSIS INVOLVING MULTIPLE SITES; Backache; CARDIAC PACEMAKER IN SITU; Alzheimer's dementia; GERD (gastroesophageal reflux disease); Abnormal gait; Bilateral leg weakness; Atrial fibrillation, persistent ([place]); Recurrent falls; Dyspnea; and Myelodysplasia on his problem list.     is allergic to codeine and penicillins.  Current Outpatient Prescriptions on File Prior to Visit  Medication Sig Dispense Refill  . amitriptyline (ELAVIL) 10 MG tablet Take 10 mg by mouth at bedtime.    Marland Kitchen amLODipine (NORVASC) 2.5 MG tablet Take 1 tablet (2.5 mg  total) by mouth daily. 30 tablet 3  . dorzolamide-timolol (COSOPT) 22.3-6.8 MG/ML ophthalmic solution Place 1 drop into both eyes 2 (two) times daily.     . fentaNYL (DURAGESIC - DOSED MCG/HR) 25 MCG/HR patch Place 1 patch (25 mcg total) onto the skin every 3 (three) days. 10 patch 0  . hydrocortisone-pramoxine (PROCTOFOAM HC)  rectal foam Place 1 applicator rectally 2 (two) times daily. 10 g 0  . latanoprost (XALATAN) 0.005 % ophthalmic solution Place 1 drop into both eyes at bedtime.     . Meth-Hyo-M Bl-Na Phos-Ph Sal (URIBEL) 118 MG CAPS Take 118 mg by mouth 3 (three) times daily as needed (bladder spasms).     . polyethylene glycol powder (GLYCOLAX/MIRALAX) powder Take 17 g by mouth daily. 3350 g 1  . traMADol (ULTRAM) 50 MG tablet Take 1 tablet (50 mg total) by mouth daily as needed for moderate pain. 30 tablet 2  . hydrochlorothiazide (HYDRODIURIL) 25 MG tablet Take 12.5 mg by mouth daily. Take 1/2 tab  (daily) 12.5 mg     No current facility-administered medications on file prior to visit.    Past Surgical History  Procedure Laterality Date  . Cataract extraction, bilateral  left  1997/   right 2003  . Pacemaker insertion  12-06-2008  DR GREGG TAYLOR    BiV PPM  --  [employer]  . Colonoscopy  08/18/2006    [id number] granularity and erosions of the rectum of uncertain significance biopsied.  A long redundant, but otherwise normal-appearing colon.melanosi coli and minimal inflammation on bx  . Inguinal hernia repair Bilateral AS TEEN  . Interstim implant placement  2001  &  2007  . Cysto/ hod/  replacement interstim implant  05-14-2003  . Removal interstim implant/  cyst/  hod  04-14-2004  . Revision interstim implant and replace generator  05-15-2009    left upper buttock for urge urinary incontinence  . Wide excision left heel and sln bx  JUNE 2012  . Tonsillectomy  1950  . Left elbow surgery  2002  . Back surgery  X3  LAST ONE'90's  . Transthoracic echocardiogram  12-06-2008    LVSF 55-60%/  MODERATE MR/  MILD AR/  QUESTION DISTAL POSTERIOR HYPOKINESIS  . Interstim implant revision N/A 08/28/2013    Procedure: REPLACEMENT OF INTERSTIM-GENERATOR AND LEAD ;  Surgeon: [name], MD;  Location: [place];  Service: Urology;  Laterality: N/A;  . Esophagogastroduodenoscopy N/A  03/28/2014    Dr. [name]: normal esophagus s/p dilation, mosaic appearance - chronic inflammation noted on bx  . Savory dilation N/A 03/28/2014    Procedure: SAVORY DILATION;  Surgeon: [name], MD;  [facility];  Service: Endoscopy;  Laterality: N/A;  Venia Minks dilation N/A 03/28/2014    Procedure: Venia Minks DILATION;  Surgeon: [name], MD;  [facility];  Service: Endoscopy;  Laterality: N/A;  . Colonoscopy N/A 08/05/2014    Procedure: COLONOSCOPY;  Surgeon: [name], MD;  [facility];  Service: Endoscopy;  Laterality: N/A;  PT NEEDS TCS AT 1000.  . Implantable cardioverter defibrillator (icd) generator change N/A 10/11/2014    Procedure: ICD GENERATOR CHANGE;  Surgeon: [name], MD;  Location: [facility] CATH LAB;  Service: Cardiovascular;  Laterality: N/A;    Denies any headaches, dizziness, double vision, fevers, chills, night sweats, nausea, vomiting, diarrhea, constipation, chest pain, heart palpitations, shortness of breath, blood in stool, black tarry stool, urinary pain, urinary burning, urinary frequency,  hematuria.   PHYSICAL EXAMINATION  ECOG PERFORMANCE STATUS: 1 - Symptomatic but completely ambulatory  Filed Vitals:   09/03/15 1026  BP: 139/86  Pulse: 77  Temp: 97.8 F (36.6 C)  Resp: 18    GENERAL:alert, no distress, well nourished, well developed, comfortable, cooperative, smiling and accompanied by his wife. SKIN: skin color, texture, turgor are normal, no rashes or significant lesions HEAD: Normocephalic, No masses, lesions, tenderness or abnormalities EYES: normal, PERRLA, EOMI, Conjunctiva are pink and non-injected EARS: External ears normal OROPHARYNX:lips, buccal mucosa, and tongue normal and mucous membranes are moist  NECK: supple, no adenopathy, trachea midline LYMPH:  no palpable lymphadenopathy BREAST:not examined LUNGS: clear to auscultation  HEART: regular rate & rhythm ABDOMEN:abdomen soft and normal bowel  sounds BACK: Back symmetric, no curvature. EXTREMITIES:less then 2 second capillary refill, no joint deformities, effusion, or inflammation, no skin discoloration, no clubbing, no cyanosis, all fingernails are dirty with nearly removed left small fingernail.  NEURO: alert & oriented x 3 with fluent speech, no focal motor/sensory deficits, gait normal   LABORATORY DATA: CBC    Component Value Date/Time   WBC 2.3* 09/03/2015 1014   RBC 3.25* 09/03/2015 1014   RBC 3.25* 09/03/2015 1014   HGB 10.5* 09/03/2015 1014   HCT 31.2* 09/03/2015 1014   PLT 78* 09/03/2015 1014   MCV 96.0 09/03/2015 1014   MCH 32.3 09/03/2015 1014   MCHC 33.7 09/03/2015 1014   RDW 14.3 09/03/2015 1014   LYMPHSABS 0.3* 09/03/2015 1014   MONOABS 0.3 09/03/2015 1014   EOSABS 0.0 09/03/2015 1014   BASOSABS 0.0 09/03/2015 1014      Chemistry      Component Value Date/Time   NA 140 09/03/2015 1014   K 3.9 09/03/2015 1014   CL 107 09/03/2015 1014   CO2 28 09/03/2015 1014   BUN 25* 09/03/2015 1014   CREATININE 0.95 09/03/2015 1014   CREATININE 1.07 05/12/2015 1338      Component Value Date/Time   CALCIUM 8.7* 09/03/2015 1014   ALKPHOS 59 09/03/2015 1014   AST 41 09/03/2015 1014   ALT 33 09/03/2015 1014   BILITOT 1.5* 09/03/2015 1014        PENDING LABS:   RADIOGRAPHIC STUDIES:  No results found.   PATHOLOGY:    ASSESSMENT AND PLAN:  Myelodysplasia Pancytopenia in the setting of a bone marrow aspiration and biopsy on 0000000 by Dr. Sterling Big at [facility] demonstrating [place] with 22q deletion, normocellular marrow at 25% without evidence of melanoma, lymphoma, or morphologic evidence of MDS.  However, decreased iron stores were noted.  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  Labs today:  Orders Placed This Encounter  Procedures  . CBC with Differential  . Comprehensive metabolic panel  . Lactate dehydrogenase  . Sedimentation  rate  . C-reactive protein  . Erythropoietin  . Reticulocytes  . Iron and TIBC  . Ferritin    At this time, there is no need for ESA therapy.  However, if he anemia progresses, we could absolutely consider ESA treatment  Leukopenia is noted.  Recent infections or hospitalizations associated with infections.  If this becomes an issue GSF support can be considered.  Not necessary now.  I have deferred his right eye vision change to his ophthalmologist.  Return in ~8 weeks for follow-up and labs: CBC diff, CMET  Addendum: Ferritin is less than 100.  Calculated iron deficit is ~ 375 mg.  I will  get him set up for 2 doses of ferric gluconate.  Pancytopenia ([place]) Secondary to MDS    THERAPY PLAN:  Continue surveillance.  All questions were answered. The patient knows to call the clinic with any problems, questions or concerns. We can certainly see the patient much sooner if necessary.  Patient and plan discussed with Dr. Ancil Linsey and she is in agreement with the aforementioned.   This note is electronically signed by: Doy Mince 09/03/2015 2:56 PM

## 2015-09-03 ENCOUNTER — Other Ambulatory Visit (HOSPITAL_COMMUNITY): Payer: Self-pay

## 2015-09-03 ENCOUNTER — Encounter (HOSPITAL_COMMUNITY): Payer: Medicare Other | Attending: Hematology & Oncology | Admitting: Oncology

## 2015-09-03 ENCOUNTER — Other Ambulatory Visit (HOSPITAL_COMMUNITY): Payer: Self-pay | Admitting: Oncology

## 2015-09-03 ENCOUNTER — Ambulatory Visit (HOSPITAL_COMMUNITY): Payer: Self-pay | Admitting: Oncology

## 2015-09-03 ENCOUNTER — Encounter (HOSPITAL_BASED_OUTPATIENT_CLINIC_OR_DEPARTMENT_OTHER): Payer: Medicare Other

## 2015-09-03 VITALS — BP 139/86 | HR 77 | Temp 97.8°F | Resp 18 | Wt 153.4 lb

## 2015-09-03 DIAGNOSIS — D61818 Other pancytopenia: Secondary | ICD-10-CM

## 2015-09-03 DIAGNOSIS — Q069 Congenital malformation of spinal cord, unspecified: Secondary | ICD-10-CM | POA: Diagnosis not present

## 2015-09-03 DIAGNOSIS — R5383 Other fatigue: Secondary | ICD-10-CM | POA: Insufficient documentation

## 2015-09-03 DIAGNOSIS — D649 Anemia, unspecified: Secondary | ICD-10-CM | POA: Diagnosis not present

## 2015-09-03 DIAGNOSIS — D469 Myelodysplastic syndrome, unspecified: Secondary | ICD-10-CM

## 2015-09-03 DIAGNOSIS — D509 Iron deficiency anemia, unspecified: Secondary | ICD-10-CM

## 2015-09-03 DIAGNOSIS — IMO0001 Reserved for inherently not codable concepts without codable children: Secondary | ICD-10-CM

## 2015-09-03 LAB — COMPREHENSIVE METABOLIC PANEL
ALBUMIN: 3.6 g/dL (ref 3.5–5.0)
ALT: 33 U/L (ref 17–63)
ANION GAP: 5 (ref 5–15)
AST: 41 U/L (ref 15–41)
Alkaline Phosphatase: 59 U/L (ref 38–126)
BUN: 25 mg/dL — AB (ref 6–20)
CALCIUM: 8.7 mg/dL — AB (ref 8.9–10.3)
CHLORIDE: 107 mmol/L (ref 101–111)
CO2: 28 mmol/L (ref 22–32)
Creatinine, Ser: 0.95 mg/dL (ref 0.61–1.24)
GFR calc Af Amer: >60 mL/min (ref >60)
GLUCOSE: 121 mg/dL — AB (ref 65–99)
POTASSIUM: 3.9 mmol/L (ref 3.5–5.1)
Sodium: 140 mmol/L (ref 135–145)
TOTAL PROTEIN: 6.6 g/dL (ref 6.5–8.1)
Total Bilirubin: 1.5 mg/dL — ABNORMAL HIGH (ref 0.3–1.2)

## 2015-09-03 LAB — CBC WITH DIFFERENTIAL/PLATELET
BASOS ABS: 0 10*3/uL (ref 0.0–0.1)
BASOS PCT: 1 %
EOS PCT: 2 %
Eosinophils Absolute: 0 10*3/uL (ref 0.0–0.7)
HCT: 31.2 % — ABNORMAL LOW (ref 39.0–52.0)
Hemoglobin: 10.5 g/dL — ABNORMAL LOW (ref 13.0–17.0)
Lymphocytes Relative: 14 %
Lymphs Abs: 0.3 10*3/uL — ABNORMAL LOW (ref 0.7–4.0)
MCH: 32.3 pg (ref 26.0–34.0)
MCHC: 33.7 g/dL (ref 30.0–36.0)
MCV: 96 fL (ref 78.0–100.0)
MONO ABS: 0.3 10*3/uL (ref 0.1–1.0)
Monocytes Relative: 11 %
Neutro Abs: 1.7 10*3/uL (ref 1.7–7.7)
Neutrophils Relative %: 72 %
PLATELETS: 78 10*3/uL — AB (ref 150–400)
RBC: 3.25 MIL/uL — ABNORMAL LOW (ref 4.22–5.81)
RDW: 14.3 % (ref 11.5–15.5)
WBC: 2.3 10*3/uL — ABNORMAL LOW (ref 4.0–10.5)

## 2015-09-03 LAB — LACTATE DEHYDROGENASE: LDH: 234 U/L — AB (ref 98–192)

## 2015-09-03 LAB — FERRITIN: FERRITIN: 49 ng/mL (ref 24–336)

## 2015-09-03 LAB — SEDIMENTATION RATE: Sed Rate: 12 mm/hr (ref 0–16)

## 2015-09-03 LAB — RETICULOCYTES
RBC.: 3.25 MIL/uL — ABNORMAL LOW (ref 4.22–5.81)
Retic Count, Absolute: 29.3 10*3/uL (ref 19.0–186.0)
Retic Ct Pct: 0.9 % (ref 0.4–3.1)

## 2015-09-03 LAB — IRON AND TIBC
IRON: 73 ug/dL (ref 45–182)
Saturation Ratios: 22 % (ref 17.9–39.5)
TIBC: 332 ug/dL (ref 250–450)
UIBC: 259 ug/dL

## 2015-09-03 LAB — C-REACTIVE PROTEIN: CRP: <0.5 mg/dL (ref <1.0)

## 2015-09-03 NOTE — Assessment & Plan Note (Addendum)
Pancytopenia in the setting of a bone marrow aspiration and biopsy on 0000000 by Dr. Sterling Big at [facility] demonstrating [place] with 22q deletion, normocellular marrow at 25% without evidence of melanoma, lymphoma, or morphologic evidence of MDS.  However, decreased iron stores were noted.  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  Labs today:  Orders Placed This Encounter  Procedures  . CBC with Differential  . Comprehensive metabolic panel  . Lactate dehydrogenase  . Sedimentation rate  . C-reactive protein  . Erythropoietin  . Reticulocytes  . Iron and TIBC  . Ferritin    At this time, there is no need for ESA therapy.  However, if he anemia progresses, we could absolutely consider ESA treatment  Leukopenia is noted.  Recent infections or hospitalizations associated with infections.  If this becomes an issue GSF support can be considered.  Not necessary now.  I have deferred his right eye vision change to his ophthalmologist.  Return in ~8 weeks for follow-up and labs: CBC diff, CMET  Addendum: Ferritin is less than 100.  Calculated iron deficit is ~ 375 mg.  I will get him set up for 2 doses of ferric gluconate.

## 2015-09-03 NOTE — Assessment & Plan Note (Signed)
Secondary to MDS 

## 2015-09-03 NOTE — Patient Instructions (Signed)
..  [place] at [facility] Discharge Instructions  RECOMMENDATIONS MADE BY THE CONSULTANT AND ANY TEST RESULTS WILL BE SENT TO YOUR REFERRING PHYSICIAN.  Exam per Kirby Crigler PA-C Return in 8 weeks for labs and appt with Dr. Whitney Muse  Thank you for choosing [place] at [facility] to provide your oncology and hematology care.  To afford each patient quality time with our provider, please arrive at least 15 minutes before your scheduled appointment time.    You need to re-schedule your appointment should you arrive 10 or more minutes late.  We strive to give you quality time with our providers, and arriving late affects you and other patients whose appointments are after yours.  Also, if you no show three or more times for appointments you may be dismissed from the clinic at the providers discretion.     Again, thank you for choosing [facility].  Our hope is that these requests will decrease the amount of time that you wait before being seen by our physicians.       _____________________________________________________________  Should you have questions after your visit to [facility], please contact our office at [phone] between the hours of 8:30 a.m. and 4:30 p.m.  Voicemails left after 4:30 p.m. will not be returned until the following business day.  For prescription refill requests, have your pharmacy contact our office.

## 2015-09-03 NOTE — Addendum Note (Signed)
Addended by: Baird Cancer on: 09/03/2015 02:56 PM   Modules accepted: Level of Service

## 2015-09-03 NOTE — Progress Notes (Signed)
Labs drawn

## 2015-09-04 DIAGNOSIS — M25511 Pain in right shoulder: Secondary | ICD-10-CM | POA: Diagnosis not present

## 2015-09-04 DIAGNOSIS — Z6824 Body mass index (BMI) 24.0-24.9, adult: Secondary | ICD-10-CM | POA: Diagnosis not present

## 2015-09-04 DIAGNOSIS — Z8679 Personal history of other diseases of the circulatory system: Secondary | ICD-10-CM | POA: Diagnosis not present

## 2015-09-04 DIAGNOSIS — M25561 Pain in right knee: Secondary | ICD-10-CM | POA: Diagnosis not present

## 2015-09-04 DIAGNOSIS — M7061 Trochanteric bursitis, right hip: Secondary | ICD-10-CM | POA: Diagnosis not present

## 2015-09-04 LAB — ERYTHROPOIETIN: Erythropoietin: 31 m[IU]/mL — ABNORMAL HIGH (ref 2.6–18.5)

## 2015-09-08 ENCOUNTER — Other Ambulatory Visit: Payer: Self-pay | Admitting: Family Medicine

## 2015-09-08 ENCOUNTER — Encounter (HOSPITAL_COMMUNITY): Payer: Medicare Other | Attending: Hematology & Oncology

## 2015-09-08 VITALS — BP 140/87 | HR 63 | Temp 97.4°F | Resp 20

## 2015-09-08 DIAGNOSIS — D649 Anemia, unspecified: Secondary | ICD-10-CM | POA: Insufficient documentation

## 2015-09-08 DIAGNOSIS — D509 Iron deficiency anemia, unspecified: Secondary | ICD-10-CM | POA: Diagnosis present

## 2015-09-08 DIAGNOSIS — R5383 Other fatigue: Secondary | ICD-10-CM | POA: Insufficient documentation

## 2015-09-08 DIAGNOSIS — D61818 Other pancytopenia: Secondary | ICD-10-CM | POA: Insufficient documentation

## 2015-09-08 MED ORDER — SODIUM CHLORIDE 0.9 % IJ SOLN: 10.0000 mL | Freq: Once | INTRAMUSCULAR | Status: DC

## 2015-09-08 MED ORDER — SODIUM CHLORIDE 0.9 % IV SOLN
125.0000 mg | Freq: Once | INTRAVENOUS | Status: AC
  Administered 2015-09-08: 125 mg via INTRAVENOUS
  Filled 2015-09-08: qty 10

## 2015-09-08 MED ORDER — SODIUM CHLORIDE 0.9 % IV SOLN
INTRAVENOUS | Status: DC
  Administered 2015-09-08: 15:00:00 via INTRAVENOUS

## 2015-09-08 NOTE — Progress Notes (Signed)
Patient tolerated infusion well.  VSS post infusion.  Patient and wife given schedule and instruction of when to come back to the clinic.

## 2015-09-08 NOTE — Patient Instructions (Signed)
[place] at [facility] Discharge Instructions  RECOMMENDATIONS MADE BY THE CONSULTANT AND ANY TEST RESULTS WILL BE SENT TO YOUR REFERRING PHYSICIAN.  Ferric Gluconate today.   Sodium Ferric Gluconate Complex injection What is this medicine? SODIUM FERRIC GLUCONATE COMPLEX (SOE dee um FER ik GLOO koe nate KOM pleks) is an iron replacement. It is used with epoetin therapy to treat low iron levels in patients who are receiving hemodialysis. This medicine may be used for other purposes; ask your health care provider or pharmacist if you have questions. What should I tell my health care provider before I take this medicine? They need to know if you have any of the following conditions: -anemia that is not from iron deficiency -high levels of iron in the body -an unusual or allergic reaction to iron, benzyl alcohol, other medicines, foods, dyes, or preservatives -pregnant or are trying to become pregnant -breast-feeding How should I use this medicine? This medicine is for infusion into a vein. It is given by a health care professional in a hospital or clinic setting. Talk to your pediatrician regarding the use of this medicine in children. While this drug may be prescribed for children as young as 20 years old for selected conditions, precautions do apply. Overdosage: If you think you have taken too much of this medicine contact a poison control center or emergency room at once. NOTE: This medicine is only for you. Do not share this medicine with others. What if I miss a dose? It is important not to miss your dose. Call your doctor or health care professional if you are unable to keep an appointment. What may interact with this medicine? Do not take this medicine with any of the following medications: -deferoxamine -dimercaprol -other iron products This medicine may also interact with the following medications: -chloramphenicol -deferasirox -medicine for blood  pressure like enalapril This list may not describe all possible interactions. Give your health care provider a list of all the medicines, herbs, non-prescription drugs, or dietary supplements you use. Also tell them if you smoke, drink alcohol, or use illegal drugs. Some items may interact with your medicine. What should I watch for while using this medicine? Your condition will be monitored carefully while you are receiving this medicine. Visit your doctor for check-ups as directed. What side effects may I notice from receiving this medicine? Side effects that you should report to your doctor or health care professional as soon as possible: -allergic reactions like skin rash, itching or hives, swelling of the face, lips, or tongue -breathing problems -changes in hearing -changes in vision -chills, flushing, or sweating -fast, irregular heartbeat -feeling faint or lightheaded, falls -fever, flu-like symptoms -high or low blood pressure -pain, tingling, numbness in the hands or feet -severe pain in the chest, back, flanks, or groin -swelling of the ankles, feet, hands -trouble passing urine or change in the amount of urine -unusually weak or tired Side effects that usually do not require medical attention (report to your doctor or health care professional if they continue or are bothersome): -cramps -dark colored stools -diarrhea -headache -nausea, vomiting -stomach upset This list may not describe all possible side effects. Call your doctor for medical advice about side effects. You may report side effects to FDA at 1-800-FDA-1088. Where should I keep my medicine? This drug is given in a hospital or clinic and will not be stored at home. NOTE: This sheet is a summary. It may not cover all possible information. If  you have questions about this medicine, talk to your doctor, pharmacist, or health care provider.    2016, Elsevier/Gold Standard. (2008-05-22 15:58:57)   Thank you for  choosing [place] at [facility] to provide your oncology and hematology care.  To afford each patient quality time with our provider, please arrive at least 15 minutes before your scheduled appointment time.    You need to re-schedule your appointment should you arrive 10 or more minutes late.  We strive to give you quality time with our providers, and arriving late affects you and other patients whose appointments are after yours.  Also, if you no show three or more times for appointments you may be dismissed from the clinic at the providers discretion.     Again, thank you for choosing [facility].  Our hope is that these requests will decrease the amount of time that you wait before being seen by our physicians.       _____________________________________________________________  Should you have questions after your visit to [facility], please contact our office at [phone] between the hours of 8:30 a.m. and 4:30 p.m.  Voicemails left after 4:30 p.m. will not be returned until the following business day.  For prescription refill requests, have your pharmacy contact our office.

## 2015-09-15 ENCOUNTER — Encounter (HOSPITAL_BASED_OUTPATIENT_CLINIC_OR_DEPARTMENT_OTHER): Payer: Medicare Other

## 2015-09-15 VITALS — BP 104/59 | HR 60 | Temp 97.7°F | Resp 14

## 2015-09-15 DIAGNOSIS — D509 Iron deficiency anemia, unspecified: Secondary | ICD-10-CM | POA: Diagnosis present

## 2015-09-15 MED ORDER — SODIUM CHLORIDE 0.9 % IJ SOLN: 10.0000 mL | Freq: Once | INTRAMUSCULAR | Status: DC

## 2015-09-15 MED ORDER — NA FERRIC GLUC CPLX IN SUCROSE 12.5 MG/ML IV SOLN
125.0000 mg | Freq: Once | INTRAVENOUS | Status: AC
  Administered 2015-09-15: 125 mg via INTRAVENOUS
  Filled 2015-09-15: qty 10

## 2015-09-15 MED ORDER — SODIUM CHLORIDE 0.9 % IV SOLN
Freq: Once | INTRAVENOUS | Status: AC
  Administered 2015-09-15: 14:00:00 via INTRAVENOUS

## 2015-09-15 NOTE — Progress Notes (Signed)
Patient tolerated infusion well.  VSS post infusion.   

## 2015-09-15 NOTE — Patient Instructions (Signed)
[facility] at [facility] Discharge Instructions  RECOMMENDATIONS MADE BY THE CONSULTANT AND ANY TEST RESULTS WILL BE SENT TO YOUR REFERRING PHYSICIAN.  IV Iron today.    Thank you for choosing [facility] to provide your oncology and hematology care.  To afford each patient quality time with our provider, please arrive at least 15 minutes before your scheduled appointment time.    You need to re-schedule your appointment should you arrive 10 or more minutes late.  We strive to give you quality time with our providers, and arriving late affects you and other patients whose appointments are after yours.  Also, if you no show three or more times for appointments you may be dismissed from the clinic at the providers discretion.     Again, thank you for choosing [facility].  Our hope is that these requests will decrease the amount of time that you wait before being seen by our physicians.       _____________________________________________________________  Should you have questions after your visit to [facility], please contact our office at [phone] between the hours of 8:30 a.m. and 4:30 p.m.  Voicemails left after 4:30 p.m. will not be returned until the following business day.  For prescription refill requests, have your pharmacy contact our office.     

## 2015-09-30 ENCOUNTER — Encounter: Payer: Self-pay | Admitting: *Deleted

## 2015-10-01 ENCOUNTER — Ambulatory Visit (INDEPENDENT_AMBULATORY_CARE_PROVIDER_SITE_OTHER): Payer: Medicare Other | Admitting: Cardiology

## 2015-10-01 ENCOUNTER — Encounter: Payer: Self-pay | Admitting: Cardiology

## 2015-10-01 VITALS — BP 118/78 | HR 82 | Ht 71.0 in | Wt 155.0 lb

## 2015-10-01 DIAGNOSIS — G8929 Other chronic pain: Secondary | ICD-10-CM | POA: Diagnosis not present

## 2015-10-01 DIAGNOSIS — I481 Persistent atrial fibrillation: Secondary | ICD-10-CM | POA: Diagnosis not present

## 2015-10-01 DIAGNOSIS — I5022 Chronic systolic (congestive) heart failure: Secondary | ICD-10-CM

## 2015-10-01 DIAGNOSIS — I4819 Other persistent atrial fibrillation: Secondary | ICD-10-CM

## 2015-10-01 DIAGNOSIS — I4891 Unspecified atrial fibrillation: Secondary | ICD-10-CM | POA: Diagnosis not present

## 2015-10-01 DIAGNOSIS — I272 Other secondary pulmonary hypertension: Secondary | ICD-10-CM

## 2015-10-01 DIAGNOSIS — R5383 Other fatigue: Secondary | ICD-10-CM | POA: Diagnosis not present

## 2015-10-01 DIAGNOSIS — M549 Dorsalgia, unspecified: Secondary | ICD-10-CM

## 2015-10-01 DIAGNOSIS — Z950 Presence of cardiac pacemaker: Secondary | ICD-10-CM | POA: Diagnosis not present

## 2015-10-01 NOTE — Assessment & Plan Note (Addendum)
His main complaint today is fatigue x 1 week. "just feel run down"

## 2015-10-01 NOTE — Assessment & Plan Note (Signed)
PA pressure 69 mmHg Oct 2016- echo

## 2015-10-01 NOTE — Assessment & Plan Note (Signed)
Unable to tolerate anticoagulation secondary to GI bleeding CHADs VASc=3

## 2015-10-01 NOTE — Patient Instructions (Signed)
Your physician recommends that you schedule a follow-up appointment in: 2 weeks with Jory Sims, NP.   Your physician recommends that you have lab work done today: (CBC, BMP, BNP)  If you need a refill on your cardiac medications before your next appointment, please call your pharmacy.  Thank you for choosing [place]!

## 2015-10-01 NOTE — Assessment & Plan Note (Signed)
Prior surgery, on Duragesic patch.

## 2015-10-01 NOTE — Assessment & Plan Note (Signed)
Hx of NICM- most recent EF 07/23/15- EF 55-60%!

## 2015-10-01 NOTE — Progress Notes (Signed)
10/01/2015 [name]   [birth year]  [id number]  Primary Physician [name], MD Primary Cardiologist: Dr Lovena Le  HPI:  81 y.o. Male with a history of chronic systolic CHF, NICM, Hypertension, s/p BiV PPM followed by Dr. Lovena Le, and atrial fib but no anticoagulant due to GIB. He is intolerant to BB because of perceived side effects , and ACE/ARB due to previous renal insufficiency. His last echo was Oct 2016 and his EF was 55-60%!. He did have significant pulmonary HTN on echo with PA pressure of 69 mmHg. He is in the office today with complaints of fatigue for one week. "i just feel run down". He gets Iron injections every Monday and was told his blood levels were OK.  He is concerned about his pacemaker site "its too big".  His wife says he is breathing hard at times.    Current Outpatient Prescriptions  Medication Sig Dispense Refill  . amitriptyline (ELAVIL) 10 MG tablet Take 10 mg by mouth at bedtime.    Marland Kitchen amLODipine (NORVASC) 2.5 MG tablet Take 1 tablet (2.5 mg total) by mouth daily. 30 tablet 3  . amLODipine (NORVASC) 2.5 MG tablet TAKE 1 TABLET (2.5 MG TOTAL) BY MOUTH DAILY. 30 tablet 3  . diclofenac sodium (VOLTAREN) 1 % GEL USE 1 APPLICATION 3 TIMES DAILY AS DIRECTED.  4  . dorzolamide-timolol (COSOPT) 22.3-6.8 MG/ML ophthalmic solution Place 1 drop into both eyes 2 (two) times daily.     . fentaNYL (DURAGESIC - DOSED MCG/HR) 25 MCG/HR patch Place 1 patch (25 mcg total) onto the skin every 3 (three) days. 10 patch 0  . hydrochlorothiazide (HYDRODIURIL) 25 MG tablet Take 12.5 mg by mouth daily. Take 1/2 tab  (daily) 12.5 mg    . hydrocortisone-pramoxine (PROCTOFOAM HC) rectal foam Place 1 applicator rectally 2 (two) times daily. 10 g 0  . latanoprost (XALATAN) 0.005 % ophthalmic solution Place 1 drop into both eyes at bedtime.     . Meth-Hyo-M Bl-Na Phos-Ph Sal (URIBEL) 118 MG CAPS Take 118 mg by mouth 3 (three) times daily as needed (bladder spasms).     . polyethylene  glycol powder (GLYCOLAX/MIRALAX) powder Take 17 g by mouth daily. 3350 g 1  . traMADol (ULTRAM) 50 MG tablet Take 1 tablet (50 mg total) by mouth daily as needed for moderate pain. 30 tablet 2   No current facility-administered medications for this visit.    Allergies  Allergen Reactions  . Codeine Diarrhea  . Penicillins     Other reaction(s): OTHER    Social History   Social History  . Marital Status: Married    Spouse Name: N/A  . Number of Children: N/A  . Years of Education: N/A   Occupational History  . retired     Social History Main Topics  . Smoking status: Former Smoker -- 0.02 packs/day    Types: Cigarettes    Quit date: 08/24/1997  . Smokeless tobacco: Never Used  . Alcohol Use: No  . Drug Use: No  . Sexual Activity:    Partners: Male   Other Topics Concern  . Not on file   Social History Narrative     Review of Systems: General: negative for chills, fever, night sweats or weight changes.  Cardiovascular: negative for chest pain, dyspnea on exertion, edema, orthopnea, palpitations, paroxysmal nocturnal dyspnea or shortness of breath Dermatological: negative for rash Respiratory: negative for cough or wheezing Urologic: negative for hematuria Abdominal: negative for nausea, vomiting, diarrhea, bright red blood per  rectum, melena, or hematemesis Neurologic: negative for visual changes, syncope, or dizziness All other systems reviewed and are otherwise negative except as noted above.    Blood pressure 118/78, pulse 82, height 5\' 11"  (1.803 m), weight 155 lb (70.308 kg), SpO2 99 %.  General appearance: alert, cooperative, appears stated age and no distress Neck: no carotid bruit and no JVD Lungs: clear to auscultation bilaterally Heart: irregularly irregular rhythm and 2/6 systolic murmur LSB Abdomen: soft, non-tender; bowel sounds normal; no masses,  no organomegaly Extremities: no edema Skin: cool, dry, his pacemaker is prominent on his Lt chest  but no signs of infection.  Neurologic: Grossly normal  EKG Looks like AF with V pacing  ASSESSMENT AND PLAN:   Chronic systolic heart failure (HCC) Hx of NICM- most recent EF 07/23/15- EF 55-60%!  Atrial fibrillation, persistent (HCC) Unable to tolerate anticoagulation secondary to GI bleeding CHADs VASc=3  Biventricular cardiac pacemaker in situ MDT BiV pacemaker, last gen change Jan 2016- Dr Lovena Le follows  Pulmonary hypertension ([place]) PA pressure 69 mmHg Oct 2016- echo  Fatigue His main complaint today is fatigue x 1 week. "just feel run down"  Chronic back pain Prior surgery, on Duragesic patch.    PLAN  Not sure why he feels "fatigued". His echo showed good LVF, but he did have pulmonary HTN. His anemia is being followed and treated. He is not in CHF on exam. His TSH in Aug 2016 was WNL. He is on no medications that would obviously make him fatigued. ? If fatigue is from CAF and pulmonary HTN (O2 sat 99% on RA). Check CBC, BNP,  and BMP (last Hgb was 10.5 Nov 30th).   Kerin Ransom K PA-C 10/01/2015 4:09 PM

## 2015-10-01 NOTE — Assessment & Plan Note (Signed)
MDT BiV pacemaker, last gen change Jan 2016- Dr Lovena Le follows

## 2015-10-02 LAB — CBC WITH DIFFERENTIAL/PLATELET
Basophils Absolute: 0 10*3/uL (ref 0.0–0.1)
Basophils Relative: 1 % (ref 0–1)
Eosinophils Absolute: 0 10*3/uL (ref 0.0–0.7)
Eosinophils Relative: 2 % (ref 0–5)
HCT: 31.2 % — ABNORMAL LOW (ref 39.0–52.0)
Hemoglobin: 10.6 g/dL — ABNORMAL LOW (ref 13.0–17.0)
Lymphocytes Relative: 21 % (ref 12–46)
Lymphs Abs: 0.4 10*3/uL — ABNORMAL LOW (ref 0.7–4.0)
MCH: 32.4 pg (ref 26.0–34.0)
MCHC: 34 g/dL (ref 30.0–36.0)
MCV: 95.4 fL (ref 78.0–100.0)
MPV: 10.9 fL (ref 8.6–12.4)
Monocytes Absolute: 0.3 10*3/uL (ref 0.1–1.0)
Monocytes Relative: 14 % — ABNORMAL HIGH (ref 3–12)
Neutro Abs: 1.3 10*3/uL — ABNORMAL LOW (ref 1.7–7.7)
Neutrophils Relative %: 62 % (ref 43–77)
Platelets: 89 10*3/uL — ABNORMAL LOW (ref 150–400)
RBC: 3.27 MIL/uL — ABNORMAL LOW (ref 4.22–5.81)
RDW: 15.2 % (ref 11.5–15.5)
WBC: 2.1 10*3/uL — ABNORMAL LOW (ref 4.0–10.5)

## 2015-10-02 LAB — BASIC METABOLIC PANEL
BUN: 23 mg/dL (ref 7–25)
CO2: 26 mmol/L (ref 20–31)
Calcium: 8.5 mg/dL — ABNORMAL LOW (ref 8.6–10.3)
Chloride: 108 mmol/L (ref 98–110)
Creat: 0.94 mg/dL (ref 0.70–1.11)
Glucose, Bld: 88 mg/dL (ref 65–99)
Potassium: 4.2 mmol/L (ref 3.5–5.3)
Sodium: 144 mmol/L (ref 135–146)

## 2015-10-02 LAB — BRAIN NATRIURETIC PEPTIDE: Brain Natriuretic Peptide: 583.4 pg/mL — ABNORMAL HIGH (ref 0.0–100.0)

## 2015-10-10 ENCOUNTER — Telehealth: Payer: Self-pay | Admitting: *Deleted

## 2015-10-10 MED ORDER — HYDROCHLOROTHIAZIDE 12.5 MG PO TABS: 12.5000 mg | ORAL_TABLET | Freq: Every day | ORAL | Status: DC

## 2015-10-10 NOTE — Telephone Encounter (Signed)
-----   Message from Erlene Quan, [place] sent at 10/03/2015 12:20 PM EST ----- Please ask this pt to increase his HCTZ to 25 mg a day for 5 days, then go back to 12.5 mg daily.  Kerin Ransom PA-C 10/03/2015 12:20 PM

## 2015-10-10 NOTE — Telephone Encounter (Signed)
Spoke with pt, he can not find the medication and reports he is not taking that medication. New script called into the pharmacy for HCTZ 12.5 mg once daily Patient voiced understanding

## 2015-10-17 ENCOUNTER — Ambulatory Visit (INDEPENDENT_AMBULATORY_CARE_PROVIDER_SITE_OTHER): Payer: Medicare Other | Admitting: Adult Health

## 2015-10-17 ENCOUNTER — Encounter: Payer: Self-pay | Admitting: Adult Health

## 2015-10-17 VITALS — BP 138/86 | HR 66 | Ht 71.0 in | Wt 157.0 lb

## 2015-10-17 DIAGNOSIS — F0390 Unspecified dementia without behavioral disturbance: Secondary | ICD-10-CM | POA: Diagnosis not present

## 2015-10-17 DIAGNOSIS — I10 Essential (primary) hypertension: Secondary | ICD-10-CM | POA: Diagnosis not present

## 2015-10-17 NOTE — Patient Instructions (Signed)
Your physician wants you to follow-up in: 6 months with Jory Sims, NP.  You will receive a reminder letter in the mail two months in advance. If you don't receive a letter, please call our office to schedule the follow-up appointment.  Your physician recommends that you continue on your current medications as directed. Please refer to the Current Medication list given to you today.  Your physician recommends that you schedule a follow-up appointment with Dr. Lovena Le in February   If you need a refill on your cardiac medications before your next appointment, please call your pharmacy.  Thank you for choosing [facility]!

## 2015-10-17 NOTE — Progress Notes (Signed)
Cardiology Office Note   Date:  10/17/2015   ID:  [name], DOB 1926-02-24, MRN [id number]  PCP:  [name], MD  Cardiologist: Bryna Colander, NP   Chief Complaint  Patient presents with  . Hypertension      History of Present Illness: [name] is a 82 y.o. male who presents for ongoing assessment and management of chronic systolic CHF, NICM, Hypertension, s/p BiV PPM followed by Dr. Lovena Le, and atrial fib but no anticoagulant due to GIB. He is intolerant to BB because of perceived side effects , and ACE/ARB due to previous renal insufficiency. His last echo was Oct 2016 and his EF was 55-60%!. He did have significant pulmonary HTN on echo with PA pressure of 69 mmHg. On last office visit he complained of fatigue. CBC and BMET with BNP were ordered.   Labs: Na !44, K+4.2, Creatinine 0.94, BNP 583, Hgb 10.6/Hct 11.6. WBC 2.1  He comes in today with confusion about his medications and what he takes. He thinks he is only taking 3 pills a day. But not sure. His wife who is with him has no idea what he is taking either. Going over medication list is of no help. He also brings back a cardiac monitor that was ordered in the past. He has an ICD pacemaker in situ and I am uncertain why this was ordered when remote checks are made monthly.   Past Medical History  Diagnosis Date  . Chronic systolic heart failure ([place])   . Rash and other nonspecific skin eruption   . Costochondritis, acute   . Glaucoma   . Erectile dysfunction   . Visual changes   . Pancytopenia   . Chronic pancreatitis ([place])     Based on CT findings  . History of melanoma in situ     JAN 2012--  LEFT HEEL W/ SLN BX  . Chronic back pain   . IC (interstitial cystitis)   . BPH (benign prostatic hypertrophy)   . Cardiac pacemaker     CARDIOLOGIST-- DR Cristopher Peru (LAST PACE [facility] 05-15-2013)  . Asymptomatic carotid artery stenosis     BILATERAL MILD ICA---  <50% PER DUPLEX 03-08-2008  . History of  syncope   . Hypertension   . CHB (complete heart block) ([place])   . Nonischemic cardiomyopathy ([place])   . Severe mitral regurgitation   . Urge urinary incontinence   . A-fib ([place])   . Dementia   . Alzheimer disease     Past Surgical History  Procedure Laterality Date  . Cataract extraction, bilateral  left  1997/   right 2003  . Pacemaker insertion  12-06-2008  DR GREGG TAYLOR    BiV PPM  --  [employer]  . Colonoscopy  08/18/2006    [id number] granularity and erosions of the rectum of uncertain significance biopsied.  A long redundant, but otherwise normal-appearing colon.melanosi coli and minimal inflammation on bx  . Inguinal hernia repair Bilateral AS TEEN  . Interstim implant placement  2001  &  2007  . Cysto/ hod/  replacement interstim implant  05-14-2003  . Removal interstim implant/  cyst/  hod  04-14-2004  . Revision interstim implant and replace generator  05-15-2009    left upper buttock for urge urinary incontinence  . Wide excision left heel and sln bx  JUNE 2012  . Tonsillectomy  1950  . Left elbow surgery  2002  . Back surgery  X3  LAST ONE'90's  . Transthoracic echocardiogram  12-06-2008    LVSF 55-60%/  MODERATE MR/  MILD AR/  QUESTION DISTAL POSTERIOR HYPOKINESIS  . Interstim implant revision N/A 08/28/2013    Procedure: REPLACEMENT OF INTERSTIM-GENERATOR AND LEAD ;  Surgeon: [name], MD;  Location: [facility];  Service: Urology;  Laterality: N/A;  . Esophagogastroduodenoscopy N/A 03/28/2014    Dr. [name]: normal esophagus s/p dilation, mosaic appearance - chronic inflammation noted on bx  . Savory dilation N/A 03/28/2014    Procedure: SAVORY DILATION;  Surgeon: [name], MD;  [facility];  Service: Endoscopy;  Laterality: N/A;  Venia Minks dilation N/A 03/28/2014    Procedure: Venia Minks DILATION;  Surgeon: [name], MD;  [facility];  Service: Endoscopy;  Laterality: N/A;  . Colonoscopy N/A 08/05/2014     Procedure: COLONOSCOPY;  Surgeon: [name], MD;  [facility];  Service: Endoscopy;  Laterality: N/A;  PT NEEDS TCS AT 1000.  . Implantable cardioverter defibrillator (icd) generator change N/A 10/11/2014    Procedure: ICD GENERATOR CHANGE;  Surgeon: [name], MD;  [facility];  Service: Cardiovascular;  Laterality: N/A;     Current Outpatient Prescriptions  Medication Sig Dispense Refill  . amitriptyline (ELAVIL) 10 MG tablet Take 10 mg by mouth at bedtime.    Marland Kitchen amLODipine (NORVASC) 2.5 MG tablet TAKE 1 TABLET (2.5 MG TOTAL) BY MOUTH DAILY. 30 tablet 3  . diclofenac sodium (VOLTAREN) 1 % GEL USE 1 APPLICATION 3 TIMES DAILY AS DIRECTED.  4  . dorzolamide-timolol (COSOPT) 22.3-6.8 MG/ML ophthalmic solution Place 1 drop into both eyes 2 (two) times daily.     . fentaNYL (DURAGESIC - DOSED MCG/HR) 25 MCG/HR patch Place 1 patch (25 mcg total) onto the skin every 3 (three) days. 10 patch 0  . hydrochlorothiazide (HYDRODIURIL) 12.5 MG tablet Take 1 tablet (12.5 mg total) by mouth daily. 30 tablet 6  . hydrocortisone-pramoxine (PROCTOFOAM HC) rectal foam Place 1 applicator rectally 2 (two) times daily. 10 g 0  . latanoprost (XALATAN) 0.005 % ophthalmic solution Place 1 drop into both eyes at bedtime.     . Meth-Hyo-M Bl-Na Phos-Ph Sal (URIBEL) 118 MG CAPS Take 118 mg by mouth 3 (three) times daily as needed (bladder spasms).     . polyethylene glycol powder (GLYCOLAX/MIRALAX) powder Take 17 g by mouth daily. 3350 g 1  . traMADol (ULTRAM) 50 MG tablet Take 1 tablet (50 mg total) by mouth daily as needed for moderate pain. 30 tablet 2   No current facility-administered medications for this visit.    Allergies:   Codeine and Penicillins    Social History:  The patient  reports that he quit smoking about 18 years ago. His smoking use included Cigarettes. He smoked 0.02 packs per day. He has never used smokeless tobacco. He reports that he does not drink alcohol or use  illicit drugs.   Family History:  The patient's family history includes Cancer (age of onset: 64) in his father; Dementia in his mother; Heart disease in his brother; Lung cancer in his sister; Throat cancer in his father.    ROS: All other systems are reviewed and negative. Unless otherwise mentioned in H&P    PHYSICAL EXAM: VS:  BP 138/86 mmHg  Pulse 66  Ht 5\' 11"  (1.803 m)  Wt 157 lb (71.215 kg)  BMI 21.91 kg/m2  SpO2 98% , BMI Body mass index is 21.91 kg/(m^2). GEN: Well nourished, well developed, in no  acute distress HEENT: normal Neck: no JVD, carotid bruits, or masses Cardiac: RRR; no murmurs, rubs, or gallops,no edema  Respiratory:  clear to auscultation bilaterally, normal work of breathing GI: soft, nontender, nondistended, + BS MS: no deformity or atrophy Skin: warm and dry, no rash Neuro:  Strength and sensation are intact Psych: euthymic mood, full affect Significant memory issues and confusion.     Recent Labs: 01/06/2015: B Natriuretic Peptide 664.0* 05/27/2015: TSH 2.875 09/03/2015: ALT 33 10/01/2015: BUN 23; Creat 0.94; Hemoglobin 10.6*; Platelets 89*; Potassium 4.2; Sodium 144    Lipid Panel    Component Value Date/Time   CHOL 179 06/19/2013 1241   TRIG 66 06/19/2013 1241   HDL 51 06/19/2013 1241   CHOLHDL 3.5 06/19/2013 1241   VLDL 13 06/19/2013 1241   LDLCALC 115* 06/19/2013 1241      Wt Readings from Last 3 Encounters:  10/17/15 157 lb (71.215 kg)  10/01/15 155 lb (70.308 kg)  09/03/15 153 lb 6.4 oz (69.582 kg)     ASSESSMENT AND PLAN:  1.  Hypertension: Currently well controlled despite not knowing what he is taking. I am very concerned about this patient and his wife, as they appear to have dementia, more so in this patient. I am uncertain that they are comprehending or taking his medications as directed all the time. SNF or Assisted living is strongly recommended.   2.Pancytopenia: Still leukemic, but no worse than last lab draws. Defer to  PCP  3.Dementia: I have had [facility] come out to see them but they have been sent away. I am concerned about the safety and overall self management of his chronic conditions. Consider strongly placement.   Current medicines are reviewed at length with the patient today.    Labs/ tests ordered today include: None No orders of the defined types were placed in this encounter.     Disposition:   FU with 6 months  Signed, Jory Sims, NP  10/17/2015 3:25 PM    [facility] [address] [place], [place] [address] Phone: [phone]; Fax: [phone]

## 2015-10-17 NOTE — Progress Notes (Signed)
Name: [name]    DOB: 07-Dec-1925  Age: 82 y.o.  MR#: [id number]       PCP:  [name], MD      Insurance: Payor: MEDICARE / Plan: MEDICARE PART A AND B / Product Type: *No Product type* /   CC:   No chief complaint on file.   VS Filed Vitals:   10/17/15 1317  BP: 138/86  Pulse: 66  Height: 5\' 11"  (1.803 m)  Weight: 157 lb (71.215 kg)  SpO2: 98%    Weights Current Weight  10/17/15 157 lb (71.215 kg)  10/01/15 155 lb (70.308 kg)  09/03/15 153 lb 6.4 oz (69.582 kg)    Blood Pressure  BP Readings from Last 3 Encounters:  10/17/15 138/86  10/01/15 118/78  09/15/15 104/59     Admit date:  (Not on file) Last encounter with RMR:  07/18/2015   Allergy Codeine and Penicillins  Current Outpatient Prescriptions  Medication Sig Dispense Refill  . amitriptyline (ELAVIL) 10 MG tablet Take 10 mg by mouth at bedtime.    Marland Kitchen amLODipine (NORVASC) 2.5 MG tablet Take 1 tablet (2.5 mg total) by mouth daily. 30 tablet 3  . amLODipine (NORVASC) 2.5 MG tablet TAKE 1 TABLET (2.5 MG TOTAL) BY MOUTH DAILY. 30 tablet 3  . diclofenac sodium (VOLTAREN) 1 % GEL USE 1 APPLICATION 3 TIMES DAILY AS DIRECTED.  4  . dorzolamide-timolol (COSOPT) 22.3-6.8 MG/ML ophthalmic solution Place 1 drop into both eyes 2 (two) times daily.     . fentaNYL (DURAGESIC - DOSED MCG/HR) 25 MCG/HR patch Place 1 patch (25 mcg total) onto the skin every 3 (three) days. 10 patch 0  . hydrochlorothiazide (HYDRODIURIL) 12.5 MG tablet Take 1 tablet (12.5 mg total) by mouth daily. 30 tablet 6  . hydrocortisone-pramoxine (PROCTOFOAM HC) rectal foam Place 1 applicator rectally 2 (two) times daily. 10 g 0  . latanoprost (XALATAN) 0.005 % ophthalmic solution Place 1 drop into both eyes at bedtime.     . Meth-Hyo-M Bl-Na Phos-Ph Sal (URIBEL) 118 MG CAPS Take 118 mg by mouth 3 (three) times daily as needed (bladder spasms).     . polyethylene glycol powder (GLYCOLAX/MIRALAX) powder Take 17 g by mouth daily. 3350 g 1  . traMADol  (ULTRAM) 50 MG tablet Take 1 tablet (50 mg total) by mouth daily as needed for moderate pain. 30 tablet 2   No current facility-administered medications for this visit.    Discontinued Meds:   There are no discontinued medications.  Patient Active Problem List   Diagnosis Date Noted  . Pulmonary hypertension ([place]) 10/01/2015  . Fatigue 10/01/2015  . Chronic back pain 10/01/2015  . Myelodysplasia 06/20/2015  . Dyspnea 02/09/2015  . Recurrent falls 08/01/2014  . Atrial fibrillation, persistent ([place]) 02/22/2014  . Bilateral leg weakness 05/01/2013  . Abnormal gait 06/12/2012  . GERD (gastroesophageal reflux disease) 05/09/2012  . Alzheimer's dementia 11/17/2011  . Backache 02/18/2010  . GEN OSTEOARTHROSIS INVOLVING MULTIPLE SITES 11/25/2009  . CENTRAL HEARING LOSS 11/10/2009  . Biventricular cardiac pacemaker in situ 07/22/2009  . INTERSTITIAL CYSTITIS 07/14/2009  . Pancytopenia ([place]) 12/31/2008  . MITRAL REGURGITATION, MODERATE 12/31/2008  . Chronic systolic heart failure ([place]) 12/31/2008  . ERECTILE DYSFUNCTION 01/31/2008  . Glaucoma 01/31/2008  . Essential hypertension 01/31/2008    LABS    Component Value Date/Time   NA 144 10/01/2015 1653   NA 140 09/03/2015 1014   NA 140 05/27/2015 1501   K 4.2 10/01/2015 1653   K 3.9  09/03/2015 1014   K 3.8 05/27/2015 1501   CL 108 10/01/2015 1653   CL 107 09/03/2015 1014   CL 105 05/27/2015 1501   CO2 26 10/01/2015 1653   CO2 28 09/03/2015 1014   CO2 29 05/27/2015 1501   GLUCOSE 88 10/01/2015 1653   GLUCOSE 121* 09/03/2015 1014   GLUCOSE 126* 05/27/2015 1501   BUN 23 10/01/2015 1653   BUN 25* 09/03/2015 1014   BUN 23* 05/27/2015 1501   CREATININE 0.94 10/01/2015 1653   CREATININE 0.95 09/03/2015 1014   CREATININE 1.06 05/27/2015 1501   CREATININE 1.07 05/12/2015 1338   CREATININE 1.28 01/27/2015 1533   CREATININE 0.96 01/06/2015 1955   CALCIUM 8.5* 10/01/2015 1653   CALCIUM 8.7* 09/03/2015 1014   CALCIUM 8.5*  05/27/2015 1501   GFRNONAA >60 09/03/2015 1014   GFRNONAA >60 05/27/2015 1501   GFRNONAA 73* 01/06/2015 1955   GFRAA >60 09/03/2015 1014   GFRAA >60 05/27/2015 1501   GFRAA 85* 01/06/2015 1955   CMP     Component Value Date/Time   NA 144 10/01/2015 1653   K 4.2 10/01/2015 1653   CL 108 10/01/2015 1653   CO2 26 10/01/2015 1653   GLUCOSE 88 10/01/2015 1653   BUN 23 10/01/2015 1653   CREATININE 0.94 10/01/2015 1653   CREATININE 0.95 09/03/2015 1014   CALCIUM 8.5* 10/01/2015 1653   PROT 6.6 09/03/2015 1014   ALBUMIN 3.6 09/03/2015 1014   AST 41 09/03/2015 1014   ALT 33 09/03/2015 1014   ALKPHOS 59 09/03/2015 1014   BILITOT 1.5* 09/03/2015 1014   GFRNONAA >60 09/03/2015 1014   GFRAA >60 09/03/2015 1014       Component Value Date/Time   WBC 2.1* 10/01/2015 1653   WBC 2.3* 09/03/2015 1014   WBC 2.0* 07/04/2015 1308   HGB 10.6* 10/01/2015 1653   HGB 10.5* 09/03/2015 1014   HGB 10.6* 07/04/2015 1308   HCT 31.2* 10/01/2015 1653   HCT 31.2* 09/03/2015 1014   HCT 31.1* 07/04/2015 1308   MCV 95.4 10/01/2015 1653   MCV 96.0 09/03/2015 1014   MCV 96.3 07/04/2015 1308    Lipid Panel     Component Value Date/Time   CHOL 179 06/19/2013 1241   TRIG 66 06/19/2013 1241   HDL 51 06/19/2013 1241   CHOLHDL 3.5 06/19/2013 1241   VLDL 13 06/19/2013 1241   LDLCALC 115* 06/19/2013 1241    ABG No results found for: PHART, PCO2ART, PO2ART, HCO3, TCO2, ACIDBASEDEF, O2SAT   Lab Results  Component Value Date   TSH 2.875 05/27/2015   BNP (last 3 results)  Recent Labs  01/06/15 1955  BNP 664.0*    ProBNP (last 3 results) No results for input(s): PROBNP in the last 8760 hours.  Cardiac Panel (last 3 results) No results for input(s): CKTOTAL, CKMB, TROPONINI, RELINDX in the last 72 hours.  Iron/TIBC/Ferritin/ %Sat    Component Value Date/Time   IRON 73 09/03/2015 1015   TIBC 332 09/03/2015 1015   FERRITIN 49 09/03/2015 1015   IRONPCTSAT 22 09/03/2015 1015      EKG Orders placed or performed in visit on 10/01/15  . EKG 12-Lead     Prior Assessment and Plan Problem List as of 10/17/2015      Cardiovascular and Mediastinum   MITRAL REGURGITATION, MODERATE   Last Assessment & Plan 08/08/2014 Office Visit Written 08/08/2014  4:06 PM by Lendon Colonel, NP    No apparent for repair or surgery at this time. Multiple  comorbidities. Continue medical management.      Essential hypertension   Last Assessment & Plan 06/12/2015 Office Visit Written 07/09/2015  8:20 PM by Fayrene Helper, MD    Controlled, no change in medication       Chronic systolic heart failure [facility])   Last Assessment & Plan 10/01/2015 Office Visit Written 10/01/2015  3:43 PM by Erlene Quan, PA-C    Hx of NICM- most recent EF 07/23/15- EF 55-60%!      Atrial fibrillation, persistent [facility])   Last Assessment & Plan 10/01/2015 Office Visit Written 10/01/2015  3:45 PM by Erlene Quan, PA-C    Unable to tolerate anticoagulation secondary to GI bleeding CHADs VASc=3      Pulmonary hypertension [facility])   Last Assessment & Plan 10/01/2015 Office Visit Written 10/01/2015  3:47 PM by Erlene Quan, PA-C    PA pressure 69 mmHg Oct 2016- echo        Digestive   GERD (gastroesophageal reflux disease)   Last Assessment & Plan 09/03/2013 Office Visit Written 09/09/2013  2:31 PM by Fayrene Helper, MD    Controlled, no change in medication Followed by GI        Nervous and Auditory   CENTRAL HEARING LOSS   Last Assessment & Plan 12/28/2013 Office Visit Written 12/29/2013  9:11 AM by Fayrene Helper, MD    needs hearing aids, will advise checking belltone  For help with this      Alzheimer's dementia   Last Assessment & Plan 06/12/2015 Office Visit Written 07/09/2015  8:29 PM by Fayrene Helper, MD    Increasing behavioral problems,, does not taking medication regularly, and unwilling to do so, increasing need for help at home      Bilateral leg weakness   Last  Assessment & Plan 09/09/2014 Office Visit Written 02/02/2015 10:59 AM by Fayrene Helper, MD    Improved with recent in house therapy, he is to continue out pt in home PT/OT  For next approx 4 weeks      Myelodysplasia   Last Assessment & Plan 09/03/2015 Office Visit Edited 09/03/2015  2:55 PM by Baird Cancer, PA-C    Pancytopenia in the setting of a bone marrow aspiration and biopsy on 0000000 by Dr. Sterling Big at [facility] demonstrating MDS with 22q deletion, normocellular marrow at 25% without evidence of melanoma, lymphoma, or morphologic evidence of MDS.  However, decreased iron stores were noted.  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  Labs today:  Orders Placed This Encounter  Procedures  . CBC with Differential  . Comprehensive metabolic panel  . Lactate dehydrogenase  . Sedimentation rate  . C-reactive protein  . Erythropoietin  . Reticulocytes  . Iron and TIBC  . Ferritin    At this time, there is no need for ESA therapy.  However, if he anemia progresses, we could absolutely consider ESA treatment  Leukopenia is noted.  Recent infections or hospitalizations associated with infections.  If this becomes an issue GSF support can be considered.  Not necessary now.  I have deferred his right eye vision change to his ophthalmologist.  Return in ~8 weeks for follow-up and labs: CBC diff, CMET  Addendum: Ferritin is less than 100.  Calculated iron deficit is ~ 375 mg.  I will get him set up for 2 doses of ferric gluconate.        Musculoskeletal and Integument  GEN OSTEOARTHROSIS INVOLVING MULTIPLE SITES   Last Assessment & Plan 06/12/2015 Office Visit Written 07/09/2015  8:31 PM by Fayrene Helper, MD    Continue current medication        Genitourinary   INTERSTITIAL CYSTITIS   Last Assessment & Plan 06/12/2015 Office Visit Written 07/09/2015  8:36 PM by Fayrene Helper, MD    Followed by urology has  implanted device as well as is on medication for symptoms, still c/o excessive nocturia and wants to stop his diuretic, the importance of maintaining medications as prescribed for heart failure protection is again discussed        Hematopoietic and Hemostatic   Pancytopenia [facility])   Last Assessment & Plan 09/03/2015 Office Visit Written 09/03/2015 11:22 AM by Baird Cancer, PA-C    Secondary to MDS        Other   ERECTILE DYSFUNCTION   Last Assessment & Plan 05/10/2011 Office Visit Written 05/10/2011 10:00 AM by Fayrene Helper, MD    Pt states that the cialis is not working as well, he still has a lot, advised him to discuss further with his urologist      Glaucoma   Last Assessment & Plan 02/25/2015 Office Visit Written 04/04/2015 11:06 PM by Fayrene Helper, MD    Followed by opthalmology and compliant with drops      Backache   Last Assessment & Plan 02/25/2015 Office Visit Written 04/04/2015 11:07 PM by Fayrene Helper, MD    Pt has spinal stenosis, encouraged regular use as prescribed of his chronic pain medication      Biventricular cardiac pacemaker in situ   Last Assessment & Plan 10/01/2015 Office Visit Written 10/01/2015  3:46 PM by Erlene Quan, PA-C    MDT BiV pacemaker, last gen change Jan 2016- Dr Lovena Le follows      Abnormal gait   Recurrent falls   Last Assessment & Plan 06/12/2015 Office Visit Written 07/09/2015  8:34 PM by Fayrene Helper, MD    Home safety reviewed, and importance of fall prevention stressed, including use of assistive device      Dyspnea   Last Assessment & Plan 01/06/2015 Office Visit Written 02/09/2015  5:28 PM by Fayrene Helper, MD    Recent increase with worsened  exercise tolerance and recurrent falls, pt sent to [facility] for further eval      Fatigue   Last Assessment & Plan 10/01/2015 Office Visit Edited 10/01/2015  4:01 PM by Erlene Quan, PA-C    His main complaint today is fatigue x 1 week. "just feel run down"      Chronic  back pain   Last Assessment & Plan 10/01/2015 Office Visit Written 10/01/2015  4:09 PM by Erlene Quan, PA-C    Prior surgery, on Duragesic patch.          Imaging: No results found.

## 2015-10-22 DIAGNOSIS — H401112 Primary open-angle glaucoma, right eye, moderate stage: Secondary | ICD-10-CM | POA: Diagnosis not present

## 2015-10-22 DIAGNOSIS — H401123 Primary open-angle glaucoma, left eye, severe stage: Secondary | ICD-10-CM | POA: Diagnosis not present

## 2015-10-22 DIAGNOSIS — Z961 Presence of intraocular lens: Secondary | ICD-10-CM | POA: Diagnosis not present

## 2015-10-27 ENCOUNTER — Encounter (HOSPITAL_COMMUNITY): Payer: Medicare Other | Attending: Hematology & Oncology

## 2015-10-27 DIAGNOSIS — D61818 Other pancytopenia: Secondary | ICD-10-CM | POA: Diagnosis not present

## 2015-10-27 DIAGNOSIS — D649 Anemia, unspecified: Secondary | ICD-10-CM | POA: Insufficient documentation

## 2015-10-27 DIAGNOSIS — R5383 Other fatigue: Secondary | ICD-10-CM | POA: Diagnosis not present

## 2015-10-27 DIAGNOSIS — D469 Myelodysplastic syndrome, unspecified: Secondary | ICD-10-CM

## 2015-10-27 DIAGNOSIS — IMO0001 Reserved for inherently not codable concepts without codable children: Secondary | ICD-10-CM

## 2015-10-27 LAB — CBC WITH DIFFERENTIAL/PLATELET
BASOS ABS: 0 10*3/uL (ref 0.0–0.1)
Basophils Relative: 1 %
EOS PCT: 3 %
Eosinophils Absolute: 0.1 10*3/uL (ref 0.0–0.7)
HEMATOCRIT: 32 % — AB (ref 39.0–52.0)
Hemoglobin: 10.9 g/dL — ABNORMAL LOW (ref 13.0–17.0)
LYMPHS ABS: 0.3 10*3/uL — AB (ref 0.7–4.0)
LYMPHS PCT: 17 %
MCH: 33.6 pg (ref 26.0–34.0)
MCHC: 34.1 g/dL (ref 30.0–36.0)
MCV: 98.8 fL (ref 78.0–100.0)
MONO ABS: 0.3 10*3/uL (ref 0.1–1.0)
Monocytes Relative: 13 %
NEUTROS ABS: 1.3 10*3/uL — AB (ref 1.7–7.7)
Neutrophils Relative %: 67 %
PLATELETS: 95 10*3/uL — AB (ref 150–400)
RBC: 3.24 MIL/uL — ABNORMAL LOW (ref 4.22–5.81)
RDW: 14.4 % (ref 11.5–15.5)
WBC: 1.9 10*3/uL — ABNORMAL LOW (ref 4.0–10.5)

## 2015-10-27 LAB — COMPREHENSIVE METABOLIC PANEL
ALT: 16 U/L — AB (ref 17–63)
AST: 28 U/L (ref 15–41)
Albumin: 3.6 g/dL (ref 3.5–5.0)
Alkaline Phosphatase: 60 U/L (ref 38–126)
Anion gap: 4 — ABNORMAL LOW (ref 5–15)
BILIRUBIN TOTAL: 1.1 mg/dL (ref 0.3–1.2)
BUN: 25 mg/dL — AB (ref 6–20)
CO2: 33 mmol/L — ABNORMAL HIGH (ref 22–32)
CREATININE: 0.99 mg/dL (ref 0.61–1.24)
Calcium: 8.7 mg/dL — ABNORMAL LOW (ref 8.9–10.3)
Chloride: 103 mmol/L (ref 101–111)
Glucose, Bld: 98 mg/dL (ref 65–99)
POTASSIUM: 3.8 mmol/L (ref 3.5–5.1)
Sodium: 140 mmol/L (ref 135–145)
TOTAL PROTEIN: 6.3 g/dL — AB (ref 6.5–8.1)

## 2015-10-29 ENCOUNTER — Ambulatory Visit (HOSPITAL_COMMUNITY): Payer: Self-pay | Admitting: Oncology

## 2015-10-29 ENCOUNTER — Other Ambulatory Visit (HOSPITAL_COMMUNITY): Payer: Self-pay

## 2015-11-02 NOTE — Assessment & Plan Note (Addendum)
Pancytopenia in the setting of a bone marrow aspiration and biopsy on 0000000 by Dr. Sterling Big at [facility] demonstrating [place] with 22q deletion, normocellular marrow at 25% without evidence of melanoma, lymphoma, or morphologic evidence of MDS.  However, decreased iron stores were noted.  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  In November, his ferritin was less than 100 and his calculated iron deficit was approximately 370 mg.  He was therefore given ferric gluconate 125 mg x 2.   Oncology Flowsheet 09/08/2015 09/15/2015  ferric gluconate (NULECIT) IV 125 mg 125 mg    At this time, there is no need for ESA therapy.  However, if he anemia progresses, we could absolutely consider ESA treatment.  His HGB is stable without any significant change.  Leukopenia is noted.  No recent infections or hospitalizations associated with infections.  If this becomes an issue GSF support can be considered.  Not necessary now.  Stable.  Neutrophil count is stable without any major changes.  Re-dressing of right upper arm completed by myself and nursing.  Cutting of scab from Band-Aid was completed.  New dressing placed and recommended he keep dressing in place for 48 hours.  Labs in 8 weeks: CBC diff, CMET, iron/TIBC, ferritin.  Return in 8 weeks for follow-up.  Chart is reviewed.  I agree with SNF placement and will defer to primary care provider.

## 2015-11-02 NOTE — Progress Notes (Signed)
Brian Nakayama, MD [address] [place] [address]  Myelodysplasia - Plan: CBC with Differential, Basic metabolic panel, Iron and TIBC, Ferritin  Iron deficiency - Plan: CBC with Differential, Iron and TIBC, Ferritin  CURRENT THERAPY: Observation  INTERVAL HISTORY: Ledon Muldrew 82 y.o. male returns for followup of pancytopenia in the setting of a bone marrow aspiration and biopsy on 0000000 by Dr. Sterling Big at [facility] demonstrating MDS with 22q deletion, normocellular marrow at 25% without evidence of melanoma, lymphoma, or morphologic evidence of MDS.  However, decreased iron stores were noted.  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  I personally reviewed and went over laboratory results with the patient.  The results are noted within this dictation.    He denies any hospitalizations and antibiotic needs.  He denies any B symptoms.  He has a right upper arm injury from a fall he reports.  He showed nursing and there is a photo below in the exam section.  He had a Band-Aid in place.  It was not in the appropriate location.  Scab is noted growing over Band-Aid.    He otherwise denies any complaints.  Past Medical History  Diagnosis Date  . Chronic systolic heart failure ([place])   . Rash and other nonspecific skin eruption   . Costochondritis, acute   . Glaucoma   . Erectile dysfunction   . Visual changes   . Pancytopenia   . Chronic pancreatitis ([place])     Based on CT findings  . History of melanoma in situ     JAN 2012--  LEFT HEEL W/ SLN BX  . Chronic back pain   . IC (interstitial cystitis)   . BPH (benign prostatic hypertrophy)   . Cardiac pacemaker     CARDIOLOGIST-- DR Cristopher Peru (LAST PACE [facility] 05-15-2013)  . Asymptomatic carotid artery stenosis     BILATERAL MILD ICA---  <50% PER DUPLEX 03-08-2008  . History of syncope   . Hypertension   . CHB (complete heart block) ([place])     . Nonischemic cardiomyopathy ([place])   . Severe mitral regurgitation   . Urge urinary incontinence   . A-fib ([place])   . Dementia   . Alzheimer disease     has Pancytopenia ([place]); ERECTILE DYSFUNCTION; Glaucoma; CENTRAL HEARING LOSS; MITRAL REGURGITATION, MODERATE; Essential hypertension; Chronic systolic heart failure ([place]); INTERSTITIAL CYSTITIS; GEN OSTEOARTHROSIS INVOLVING MULTIPLE SITES; Backache; Biventricular cardiac pacemaker in situ; Alzheimer's dementia; GERD (gastroesophageal reflux disease); Abnormal gait; Bilateral leg weakness; Atrial fibrillation, persistent ([place]); Recurrent falls; Dyspnea; Myelodysplasia; Pulmonary hypertension ([place]); Fatigue; and Chronic back pain on his problem list.     is allergic to codeine and penicillins.  Current Outpatient Prescriptions on File Prior to Visit  Medication Sig Dispense Refill  . amitriptyline (ELAVIL) 10 MG tablet Take 10 mg by mouth at bedtime.    Marland Kitchen amLODipine (NORVASC) 2.5 MG tablet TAKE 1 TABLET (2.5 MG TOTAL) BY MOUTH DAILY. 30 tablet 3  . diclofenac sodium (VOLTAREN) 1 % GEL USE 1 APPLICATION 3 TIMES DAILY AS DIRECTED.  4  . dorzolamide-timolol (COSOPT) 22.3-6.8 MG/ML ophthalmic solution Place 1 drop into both eyes 2 (two) times daily.     . fentaNYL (DURAGESIC - DOSED MCG/HR) 25 MCG/HR patch Place 1 patch (25 mcg total) onto the skin every 3 (three) days. 10 patch 0  . hydrochlorothiazide (HYDRODIURIL) 12.5 MG tablet Take 1 tablet (12.5  mg total) by mouth daily. 30 tablet 6  . hydrocortisone-pramoxine (PROCTOFOAM HC) rectal foam Place 1 applicator rectally 2 (two) times daily. 10 g 0  . latanoprost (XALATAN) 0.005 % ophthalmic solution Place 1 drop into both eyes at bedtime.     . Meth-Hyo-M Bl-Na Phos-Ph Sal (URIBEL) 118 MG CAPS Take 118 mg by mouth 3 (three) times daily as needed (bladder spasms).     . polyethylene glycol powder (GLYCOLAX/MIRALAX) powder Take 17 g by mouth daily. 3350 g 1  . traMADol (ULTRAM) 50 MG tablet Take 1  tablet (50 mg total) by mouth daily as needed for moderate pain. 30 tablet 2   No current facility-administered medications on file prior to visit.    Past Surgical History  Procedure Laterality Date  . Cataract extraction, bilateral  left  1997/   right 2003  . Pacemaker insertion  12-06-2008  DR GREGG TAYLOR    BiV PPM  --  [employer]  . Colonoscopy  08/18/2006    [id number] granularity and erosions of the rectum of uncertain significance biopsied.  A long redundant, but otherwise normal-appearing colon.melanosi coli and minimal inflammation on bx  . Inguinal hernia repair Bilateral AS TEEN  . Interstim implant placement  2001  &  2007  . Cysto/ hod/  replacement interstim implant  05-14-2003  . Removal interstim implant/  cyst/  hod  04-14-2004  . Revision interstim implant and replace generator  05-15-2009    left upper buttock for urge urinary incontinence  . Wide excision left heel and sln bx  JUNE 2012  . Tonsillectomy  1950  . Left elbow surgery  2002  . Back surgery  X3  LAST ONE'90's  . Transthoracic echocardiogram  12-06-2008    LVSF 55-60%/  MODERATE MR/  MILD AR/  QUESTION DISTAL POSTERIOR HYPOKINESIS  . Interstim implant revision N/A 08/28/2013    Procedure: REPLACEMENT OF INTERSTIM-GENERATOR AND LEAD ;  Surgeon: [name], MD;  Location: [place];  Service: Urology;  Laterality: N/A;  . Esophagogastroduodenoscopy N/A 03/28/2014    Dr. [name]: normal esophagus s/p dilation, mosaic appearance - chronic inflammation noted on bx  . [place] dilation N/A 03/28/2014    Procedure: SAVORY DILATION;  Surgeon: [name], MD;  [facility];  Service: Endoscopy;  Laterality: N/A;  Venia Minks dilation N/A 03/28/2014    Procedure: Venia Minks DILATION;  Surgeon: [name], MD;  [facility];  Service: Endoscopy;  Laterality: N/A;  . Colonoscopy N/A 08/05/2014    Procedure: COLONOSCOPY;  Surgeon: [name], MD;  [facility];  Service: Endoscopy;  Laterality: N/A;  PT NEEDS TCS AT 1000.  . Implantable cardioverter defibrillator (icd) generator change N/A 10/11/2014    Procedure: ICD GENERATOR CHANGE;  Surgeon: [name], MD;  Location: [facility] CATH LAB;  Service: Cardiovascular;  Laterality: N/A;    Denies any headaches, dizziness, double vision, fevers, chills, night sweats, nausea, vomiting, diarrhea, constipation, chest pain, heart palpitations, shortness of breath, blood in stool, black tarry stool, urinary pain, urinary burning, urinary frequency, hematuria.   PHYSICAL EXAMINATION  ECOG PERFORMANCE STATUS: 1 - Symptomatic but completely ambulatory  BP 129/82 P 65 Resp 20/min Temp 97.3 F O2 98% RA  GENERAL:alert, no distress, well nourished, well developed, comfortable, cooperative, smiling and accompanied by his wife. SKIN: skin color, texture, turgor are normal, no rashes or significant lesions.  Injury of right upper arm as noted below. HEAD: Normocephalic, No  masses, lesions, tenderness or abnormalities EYES: normal, PERRLA, EOMI, Conjunctiva are pink and non-injected EARS: External ears normal OROPHARYNX:lips, buccal mucosa, and tongue normal and mucous membranes are moist  NECK: supple, no adenopathy, trachea midline LYMPH:  no palpable lymphadenopathy BREAST:not examined LUNGS: clear to auscultation  HEART: regular rate & rhythm ABDOMEN:abdomen soft and normal bowel sounds BACK: Back symmetric, no curvature. EXTREMITIES:less then 2 second capillary refill, no joint deformities, effusion, or inflammation, no skin discoloration, no clubbing, no cyanosis,   RIGHT UPPER ARM       NEURO: alert & oriented x 3 with fluent speech, no focal motor/sensory deficits, gait normal   LABORATORY DATA: CBC    Component Value Date/Time   WBC 1.9* 10/27/2015 1054   RBC 3.24* 10/27/2015 1054   RBC 3.25* 09/03/2015 1014   HGB 10.9* 10/27/2015 1054   HCT 32.0* 10/27/2015 1054   PLT 95*  10/27/2015 1054   MCV 98.8 10/27/2015 1054   MCH 33.6 10/27/2015 1054   MCHC 34.1 10/27/2015 1054   RDW 14.4 10/27/2015 1054   LYMPHSABS 0.3* 10/27/2015 1054   MONOABS 0.3 10/27/2015 1054   EOSABS 0.1 10/27/2015 1054   BASOSABS 0.0 10/27/2015 1054      Chemistry      Component Value Date/Time   NA 140 10/27/2015 1054   K 3.8 10/27/2015 1054   CL 103 10/27/2015 1054   CO2 33* 10/27/2015 1054   BUN 25* 10/27/2015 1054   CREATININE 0.99 10/27/2015 1054   CREATININE 0.94 10/01/2015 1653      Component Value Date/Time   CALCIUM 8.7* 10/27/2015 1054   ALKPHOS 60 10/27/2015 1054   AST 28 10/27/2015 1054   ALT 16* 10/27/2015 1054   BILITOT 1.1 10/27/2015 1054        PENDING LABS:   RADIOGRAPHIC STUDIES:  No results found.   PATHOLOGY:    ASSESSMENT AND PLAN:  Myelodysplasia Pancytopenia in the setting of a bone marrow aspiration and biopsy on 0000000 by Dr. Sterling Big at [facility] demonstrating [place] with 22q deletion, normocellular marrow at 25% without evidence of melanoma, lymphoma, or morphologic evidence of MDS.  However, decreased iron stores were noted.  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  In November, his ferritin was less than 100 and his calculated iron deficit was approximately 370 mg.  He was therefore given ferric gluconate 125 mg x 2.   Oncology Flowsheet 09/08/2015 09/15/2015  ferric gluconate (NULECIT) IV 125 mg 125 mg    At this time, there is no need for ESA therapy.  However, if he anemia progresses, we could absolutely consider ESA treatment.  His HGB is stable without any significant change.  Leukopenia is noted.  No recent infections or hospitalizations associated with infections.  If this becomes an issue GSF support can be considered.  Not necessary now.  Stable.  Neutrophil count is stable without any major changes.  Re-dressing of right upper arm completed by myself and nursing.   Cutting of scab from Band-Aid was completed.  New dressing placed and recommended he keep dressing in place for 48 hours.  Labs in 8 weeks: CBC diff, CMET, iron/TIBC, ferritin.  Return in 8 weeks for follow-up.  Chart is reviewed.  I agree with SNF placement and will defer to primary care provider.    THERAPY PLAN:  Continue surveillance.  All questions were answered. The patient knows to call the clinic with any problems, questions or concerns.  We can certainly see the patient much sooner if necessary.  Patient and plan discussed with Dr. Ancil Linsey and she is in agreement with the aforementioned.   This note is electronically signed by: Doy Mince 11/03/2015 3:28 PM

## 2015-11-03 ENCOUNTER — Encounter (HOSPITAL_BASED_OUTPATIENT_CLINIC_OR_DEPARTMENT_OTHER): Payer: Medicare Other | Admitting: Oncology

## 2015-11-03 ENCOUNTER — Encounter (HOSPITAL_COMMUNITY): Payer: Self-pay | Admitting: Oncology

## 2015-11-03 VITALS — BP 129/82 | HR 65 | Temp 97.3°F | Resp 16 | Wt 155.0 lb

## 2015-11-03 DIAGNOSIS — D72819 Decreased white blood cell count, unspecified: Secondary | ICD-10-CM

## 2015-11-03 DIAGNOSIS — D469 Myelodysplastic syndrome, unspecified: Secondary | ICD-10-CM | POA: Diagnosis not present

## 2015-11-03 DIAGNOSIS — E611 Iron deficiency: Secondary | ICD-10-CM

## 2015-11-03 DIAGNOSIS — IMO0001 Reserved for inherently not codable concepts without codable children: Secondary | ICD-10-CM

## 2015-11-03 NOTE — Addendum Note (Signed)
Addended by: Joie Bimler on: 11/03/2015 03:46 PM   Modules accepted: Medications

## 2015-11-03 NOTE — Patient Instructions (Signed)
[place] at [facility] Discharge Instructions  RECOMMENDATIONS MADE BY THE CONSULTANT AND ANY TEST RESULTS WILL BE SENT TO YOUR REFERRING PHYSICIAN.  Exam and discussion today with Kirby Crigler, PA. Lab work and office visit in 8 weeks.   Thank you for choosing [place] at [facility] to provide your oncology and hematology care.  To afford each patient quality time with our provider, please arrive at least 15 minutes before your scheduled appointment time.   Beginning January 23rd 2017 lab work for the [facility] will be done in the  Main lab at [facility] on 1st floor. If you have a lab appointment with the [facility] please come in thru the  Main Entrance and check in at the main information desk  You need to re-schedule your appointment should you arrive 10 or more minutes late.  We strive to give you quality time with our providers, and arriving late affects you and other patients whose appointments are after yours.  Also, if you no show three or more times for appointments you may be dismissed from the clinic at the providers discretion.     Again, thank you for choosing [facility].  Our hope is that these requests will decrease the amount of time that you wait before being seen by our physicians.       _____________________________________________________________  Should you have questions after your visit to [facility], please contact our office at [phone] between the hours of 8:30 a.m. and 4:30 p.m.  Voicemails left after 4:30 p.m. will not be returned until the following business day.  For prescription refill requests, have your pharmacy contact our office.

## 2015-11-04 ENCOUNTER — Telehealth: Payer: Self-pay | Admitting: Family Medicine

## 2015-11-04 NOTE — Telephone Encounter (Signed)
No message has appt next week

## 2015-11-12 ENCOUNTER — Ambulatory Visit (INDEPENDENT_AMBULATORY_CARE_PROVIDER_SITE_OTHER): Payer: Medicare Other | Admitting: Family Medicine

## 2015-11-12 ENCOUNTER — Encounter: Payer: Self-pay | Admitting: Family Medicine

## 2015-11-12 VITALS — BP 126/74 | HR 87 | Resp 18 | Ht 69.0 in | Wt 155.0 lb

## 2015-11-12 DIAGNOSIS — R269 Unspecified abnormalities of gait and mobility: Secondary | ICD-10-CM

## 2015-11-12 DIAGNOSIS — I5022 Chronic systolic (congestive) heart failure: Secondary | ICD-10-CM

## 2015-11-12 DIAGNOSIS — M549 Dorsalgia, unspecified: Secondary | ICD-10-CM

## 2015-11-12 DIAGNOSIS — I10 Essential (primary) hypertension: Secondary | ICD-10-CM | POA: Diagnosis not present

## 2015-11-12 DIAGNOSIS — G309 Alzheimer's disease, unspecified: Secondary | ICD-10-CM

## 2015-11-12 DIAGNOSIS — G8929 Other chronic pain: Secondary | ICD-10-CM

## 2015-11-12 DIAGNOSIS — Z950 Presence of cardiac pacemaker: Secondary | ICD-10-CM

## 2015-11-12 DIAGNOSIS — F0280 Dementia in other diseases classified elsewhere without behavioral disturbance: Secondary | ICD-10-CM

## 2015-11-12 NOTE — Progress Notes (Signed)
   Subjective:    Patient ID: [name], male    DOB: 1926-07-12, 82 y.o.   MRN: [id number]  HPI   [name]     MRN: [id number]      DOB: 04-13-1926   HPI [name] is here for follow up and re-evaluation of chronic medical conditions, medication management and review of any available recent lab and radiology data.  Preventive health is updated, specifically  Cancer screening and Immunization.   Questions or concerns regarding consultations or procedures which the PT has had in the interim are  addressed. The PT denies any adverse reactions to current medications since the last visit.  States he feels stronger in the egs and is happy with new topical anti inflammatory recently started by ortho Overwhelmed by need to learn how to monitor defibrillator in the home as is his wife and they are requesting help  ROS Denies recent fever or chills. Denies sinus pressure, nasal congestion, ear pain or sore throat. Denies chest congestion, productive cough or wheezing. Denies chest pains, palpitations and leg swelling Denies abdominal pain, nausea, vomiting,diarrhea or constipation.   Denies dysuria, frequency, hesitancy or incontinence.  Denies headaches, seizures, numbness, or tingling. Denies depression, anxiety or insomnia. Denies skin break down or rash.   PE  BP 126/74 mmHg  Pulse 87  Resp 18  Ht 5\' 9"  (1.753 m)  Wt 155 lb (70.308 kg)  BMI 22.88 kg/m2  SpO2 92%  Patient alert and oriented and in no cardiopulmonary distress.  HEENT: No facial asymmetry, EOMI,   oropharynx pink and moist.  Neck decreased ROM no JVD, no mass.  Chest: Clear to auscultation bilaterally.  CVS: S1, S2 systolic  murmur, no S3.Regular rate.  ABD: Soft non tender.   Ext: No edema  MS: decreased RoM spine , hips , shoulders and knees  Skin: Intact, no ulcerations or rash noted.  Psych: Good eye contact, normal affect. Memory impaired not anxious or depressed appearing.  CNS: CN 2-12 intact,  decreased power and tone in all extremities   Assessment & Plan   Essential hypertension Controlled, no change in medication   Chronic back pain Reports improved symptoms and less lower extremity pain with topical anti inflammatory from ortho  Abnormal gait High fall risk and incapable of self care , will refer to [facility] for evaluation for any possible assistance, he lives with his spouse who is also aged and less capable of independent living, however , they both still prefer to live in their own home together at this time  Alzheimer's dementia No behavioral issues currently, stable and on no medication  Chronic systolic heart failure ([place]) Not decompensated currently, followed by cardiology in frequent basis  Biventricular cardiac pacemaker in situ Current proposal per spouse is that she learn how to monitor from home, she is overwhelmed ans states she is incapable wants an alternative arrangement, requests [facility] help again       Review of Systems     Objective:   Physical Exam        Assessment & Plan:

## 2015-11-12 NOTE — Patient Instructions (Addendum)
Annual wellness in 4.5 month, call if you need me before  Please ensure that you increase water intake , SO THAT YOU ARE NOT DEHYDRATED  You are being referred to [facility] so that you can get assistance in areas which are needed  Careful not to fall, use walker at all times  We will also let cardiology know that your wife states she is unable to learn what is being requested as far as in home monitoring via telephone of your device  Thanks for choosing [facility], we consider it a privelige to serve you.

## 2015-11-17 NOTE — Assessment & Plan Note (Signed)
Not decompensated currently, followed by cardiology in frequent basis

## 2015-11-17 NOTE — Assessment & Plan Note (Signed)
Current proposal per spouse is that she learn how to monitor from home, she is overwhelmed ans states she is incapable wants an alternative arrangement, requests [facility] help again

## 2015-11-17 NOTE — Assessment & Plan Note (Signed)
Controlled, no change in medication  

## 2015-11-17 NOTE — Assessment & Plan Note (Signed)
No behavioral issues currently, stable and on no medication

## 2015-11-17 NOTE — Assessment & Plan Note (Signed)
High fall risk and incapable of self care , will refer to [facility] for evaluation for any possible assistance, he lives with his spouse who is also aged and less capable of independent living, however , they both still prefer to live in their own home together at this time

## 2015-11-17 NOTE — Assessment & Plan Note (Signed)
Reports improved symptoms and less lower extremity pain with topical anti inflammatory from ortho

## 2015-11-25 ENCOUNTER — Other Ambulatory Visit: Payer: Self-pay | Admitting: *Deleted

## 2015-11-25 DIAGNOSIS — I509 Heart failure, unspecified: Secondary | ICD-10-CM

## 2015-11-25 DIAGNOSIS — Z950 Presence of cardiac pacemaker: Secondary | ICD-10-CM

## 2015-11-25 NOTE — Patient Outreach (Signed)
[facility]) Care Management  11/25/2015  Daelyn Topf 05-Aug-1926 [id number]   Referral from MD office: Telephone to patient; HIPPA verification received from patient. Patient advised of reason for call and of [facility] care management services.   Patient states major health concerns are chronic back pain, heart and feeling "tired". States he sees pain/neurology specialist for back and recently was given patch to apply every 3 days for pain. States he also uses pain pills if needed. States " gets shots in back every 3-4 months". States he is never completely free of the nagging pain but can get level down to 2-4 on 1-10 pain scale and feel comfortable. States uses cane most of time. Voices that he has not fallen this year but had 3 falls last year. (History in EPIC states patient has history of recurrent falls.) States he does not weigh daily and slight swelling today in feet.   Patient states he has heart pacemaker in place with replacement done 3 years ago. States he has heart murmur .Heart failure is noted as one of patient's diagnosis but he has no knowledge of red and yellow zones and action plan.    States he is seeing urologist for interstitial cystitis several times yearly.  Patient states he no longer drives, has glaucoma. States spouse takes him to all MD appointments without problems. States gets medications without difficulty.  States spouse fixes daily medicines and he takes as ordered by doctors.     States he has someone to clean home once a month. Does not have home health services.  Patient consents to [facility] care management services.  Patient has knowledge deficit regarding heart failure and action plan. Patient is falls risk due to history of falls, Chronic back pain, using cane.   Will refer to care management assistant to refer to community care coordinator for disease management and health / falls assessments. Telephonic signing off.   Sherrin Daisy, RN BSN  [place] Management Coordinator [facility] Care Management  [phone]

## 2015-11-27 ENCOUNTER — Other Ambulatory Visit: Payer: Self-pay | Admitting: *Deleted

## 2015-11-27 DIAGNOSIS — R300 Dysuria: Secondary | ICD-10-CM | POA: Diagnosis not present

## 2015-11-27 NOTE — Patient Outreach (Signed)
Follow up on referral from [facility] telephonic RN CM for pt to receive community nurse case management services.   Spoke with pt, HIPPA verified.   Home visit scheduled for 3/8.    [name].   [place] Care Management  [phone]

## 2015-12-10 ENCOUNTER — Other Ambulatory Visit: Payer: Self-pay | Admitting: *Deleted

## 2015-12-10 ENCOUNTER — Ambulatory Visit (INDEPENDENT_AMBULATORY_CARE_PROVIDER_SITE_OTHER): Payer: Medicare Other | Admitting: Urology

## 2015-12-10 ENCOUNTER — Ambulatory Visit: Payer: Self-pay | Admitting: *Deleted

## 2015-12-10 DIAGNOSIS — R300 Dysuria: Secondary | ICD-10-CM | POA: Diagnosis not present

## 2015-12-10 DIAGNOSIS — K5900 Constipation, unspecified: Secondary | ICD-10-CM

## 2015-12-10 NOTE — Patient Outreach (Signed)
Arrived at pt's home for scheduled initial home visit,  Knocked on door several times in addition to calling pt on phone.   Unable to leave a voice message - pt has no voice message.  Will attempt to f/u with pt again.    [name].   [place] Care Management  [phone]

## 2015-12-11 ENCOUNTER — Other Ambulatory Visit: Payer: Self-pay | Admitting: *Deleted

## 2015-12-11 NOTE — Patient Outreach (Signed)
Follow up call as pt was a no show yesterday (home visit).   Spoke with spouse Guy Franco who reports pt not home at this time.     Spouse reports pt  had a MD appointment yesterday and forgot RN CM was coming.   Spouse reports pt wanted to cancel [facility] services, did not feel it could offer him anything.   RN CM discussed some of the services of [facility] and as agreed will call pt back  at a later time.      [name].   [place] Care Management  [phone]

## 2015-12-15 ENCOUNTER — Other Ambulatory Visit (HOSPITAL_COMMUNITY)
Admission: RE | Admit: 2015-12-15 | Discharge: 2015-12-15 | Disposition: A | Discharge status: Home / Self Care | Payer: Medicare Other | Source: Ambulatory Visit | Attending: Adult Health | Admitting: Adult Health

## 2015-12-15 ENCOUNTER — Ambulatory Visit (HOSPITAL_COMMUNITY)
Admission: RE | Admit: 2015-12-15 | Discharge: 2015-12-15 | Disposition: A | Discharge status: Home / Self Care | Payer: Medicare Other | Source: Ambulatory Visit | Attending: Adult Health | Admitting: Adult Health

## 2015-12-15 ENCOUNTER — Ambulatory Visit (INDEPENDENT_AMBULATORY_CARE_PROVIDER_SITE_OTHER): Payer: Medicare Other | Admitting: Adult Health

## 2015-12-15 ENCOUNTER — Encounter: Payer: Self-pay | Admitting: Adult Health

## 2015-12-15 VITALS — BP 136/76 | HR 64 | Ht 71.0 in | Wt 153.0 lb

## 2015-12-15 DIAGNOSIS — R5383 Other fatigue: Secondary | ICD-10-CM | POA: Insufficient documentation

## 2015-12-15 DIAGNOSIS — R0602 Shortness of breath: Secondary | ICD-10-CM | POA: Insufficient documentation

## 2015-12-15 DIAGNOSIS — I272 Other secondary pulmonary hypertension: Secondary | ICD-10-CM

## 2015-12-15 DIAGNOSIS — R918 Other nonspecific abnormal finding of lung field: Secondary | ICD-10-CM | POA: Insufficient documentation

## 2015-12-15 DIAGNOSIS — I255 Ischemic cardiomyopathy: Secondary | ICD-10-CM | POA: Diagnosis not present

## 2015-12-15 DIAGNOSIS — I517 Cardiomegaly: Secondary | ICD-10-CM | POA: Insufficient documentation

## 2015-12-15 DIAGNOSIS — Z95810 Presence of automatic (implantable) cardiac defibrillator: Secondary | ICD-10-CM | POA: Diagnosis not present

## 2015-12-15 LAB — CBC WITH DIFFERENTIAL/PLATELET
Basophils Absolute: 0 10*3/uL (ref 0.0–0.1)
Basophils Relative: 1 %
EOS PCT: 2 %
Eosinophils Absolute: 0 10*3/uL (ref 0.0–0.7)
HCT: 34 % — ABNORMAL LOW (ref 39.0–52.0)
HEMOGLOBIN: 11.6 g/dL — AB (ref 13.0–17.0)
LYMPHS ABS: 0.5 10*3/uL — AB (ref 0.7–4.0)
LYMPHS PCT: 22 %
MCH: 33.1 pg (ref 26.0–34.0)
MCHC: 34.1 g/dL (ref 30.0–36.0)
MCV: 97.1 fL (ref 78.0–100.0)
MONOS PCT: 11 %
Monocytes Absolute: 0.2 10*3/uL (ref 0.1–1.0)
Neutro Abs: 1.3 10*3/uL — ABNORMAL LOW (ref 1.7–7.7)
Neutrophils Relative %: 64 %
PLATELETS: 95 10*3/uL — AB (ref 150–400)
RBC: 3.5 MIL/uL — AB (ref 4.22–5.81)
RDW: 13.8 % (ref 11.5–15.5)
WBC: 2 10*3/uL — AB (ref 4.0–10.5)

## 2015-12-15 NOTE — Progress Notes (Signed)
Cardiology Office Note   Date:  12/15/2015   ID:  [name], DOB January 07, 1926, MRN [id number]  PCP:  Brian Nakayama, MD  Cardiologist: Bryna Colander, NP   Chief Complaint  Patient presents with  . Fatigue  . Shortness of Breath      History of Present Illness: [name] is a 82 y.o. male who presents for ongoing assessment and management of chronic systolic CHF, NICM, Hypertension, s/p BiV PPM followed by Dr. Lovena Le, and atrial fib but no anticoagulant due to GIB. He is intolerant to BB because of perceived side effects , and ACE/ARB due to previous renal insufficiency. His last echo was Oct 2016 and his EF was 55-60%!. He did have significant pulmonary HTN on echo with PA pressure of 69 mmHg.   He comes today with complaints of generalized fatigue, shortness of breath, and he also feels that his pacemaker is not working correctly.  He states that before he had the ICD and had a "regular pacemaker." he was feeling much better.he comes with his wife, who is very frail, and has become very fatigued taking care of him.  They have been offered skilled nursing facility, but had refused this.  He is also being followed by hematology for pancytopenia,iron deficiency anemia, and myelodysplasia.  He is due to follow-up with them on 12/29/2015.  His wife has refused, external pacemaker, remote device for interrogation by phone.  She states that she is having responsibility for so many things concerning his care and she is unable to keep up with it.   A follow-up appointment in person with the pacemaker clinic.  He is scheduled for next week.  Past Medical History  Diagnosis Date  . Chronic systolic heart failure ([place])   . Rash and other nonspecific skin eruption   . Costochondritis, acute   . Glaucoma   . Erectile dysfunction   . Visual changes   . Pancytopenia   . Chronic pancreatitis ([place])     Based on CT findings  . History of melanoma in situ     JAN 2012--  LEFT HEEL W/ SLN  BX  . Chronic back pain   . IC (interstitial cystitis)   . BPH (benign prostatic hypertrophy)   . Cardiac pacemaker     CARDIOLOGIST-- DR Cristopher Peru (LAST PACE [facility] 05-15-2013)  . Asymptomatic carotid artery stenosis     BILATERAL MILD ICA---  <50% PER DUPLEX 03-08-2008  . History of syncope   . Hypertension   . CHB (complete heart block) ([place])   . Nonischemic cardiomyopathy ([place])   . Severe mitral regurgitation   . Urge urinary incontinence   . A-fib ([place])   . Dementia   . Alzheimer disease     Past Surgical History  Procedure Laterality Date  . Cataract extraction, bilateral  left  1997/   right 2003  . Pacemaker insertion  12-06-2008  DR GREGG TAYLOR    BiV PPM  --  [employer]  . Colonoscopy  08/18/2006    [id number] granularity and erosions of the rectum of uncertain significance biopsied.  A long redundant, but otherwise normal-appearing colon.melanosi coli and minimal inflammation on bx  . Inguinal hernia repair Bilateral AS TEEN  . Interstim implant placement  2001  &  2007  . Cysto/ hod/  replacement interstim implant  05-14-2003  . Removal interstim implant/  cyst/  hod  04-14-2004  . Revision interstim implant and replace generator  05-15-2009    left upper buttock  for urge urinary incontinence  . Wide excision left heel and sln bx  JUNE 2012  . Tonsillectomy  1950  . Left elbow surgery  2002  . Back surgery  X3  LAST ONE'90's  . Transthoracic echocardiogram  12-06-2008    LVSF 55-60%/  MODERATE MR/  MILD AR/  QUESTION DISTAL POSTERIOR HYPOKINESIS  . Interstim implant revision N/A 08/28/2013    Procedure: REPLACEMENT OF INTERSTIM-GENERATOR AND LEAD ;  Surgeon: [name], MD;  Location: [facility];  Service: Urology;  Laterality: N/A;  . Esophagogastroduodenoscopy N/A 03/28/2014    Dr. [name]: normal esophagus s/p dilation, mosaic appearance - chronic inflammation noted on bx  . Savory dilation N/A 03/28/2014    Procedure: SAVORY DILATION;   Surgeon: [name], MD;  [facility];  Service: Endoscopy;  Laterality: N/A;  Venia Minks dilation N/A 03/28/2014    Procedure: Venia Minks DILATION;  Surgeon: [name], MD;  [facility];  Service: Endoscopy;  Laterality: N/A;  . Colonoscopy N/A 08/05/2014    Procedure: COLONOSCOPY;  Surgeon: [name], MD;  [facility];  Service: Endoscopy;  Laterality: N/A;  PT NEEDS TCS AT 1000.  . Implantable cardioverter defibrillator (icd) generator change N/A 10/11/2014    Procedure: ICD GENERATOR CHANGE;  Surgeon: [name], MD;  [facility] C[facility] LAB;  Service: Cardiovascular;  Laterality: N/A;     Current Outpatient Prescriptions  Medication Sig Dispense Refill  . amLODipine (NORVASC) 2.5 MG tablet TAKE 1 TABLET (2.5 MG TOTAL) BY MOUTH DAILY. 30 tablet 3  . CIALIS 20 MG tablet     . Diclofenac Sodium 1.5 % SOLN USE 2 DROPS 3 TIMES A DAY  0  . fentaNYL (DURAGESIC - DOSED MCG/HR) 25 MCG/HR patch Place 1 patch (25 mcg total) onto the skin every 3 (three) days. 10 patch 0  . hydrochlorothiazide (HYDRODIURIL) 12.5 MG tablet Take 1 tablet (12.5 mg total) by mouth daily. 30 tablet 6  . polyethylene glycol powder (GLYCOLAX/MIRALAX) powder Take 17 g by mouth daily. 3350 g 1  . traMADol (ULTRAM) 50 MG tablet Take 1 tablet (50 mg total) by mouth daily as needed for moderate pain. 30 tablet 2   No current facility-administered medications for this visit.    Allergies:   Codeine and Penicillins    Social History:  The patient  reports that he quit smoking about 18 years ago. His smoking use included Cigarettes. He smoked 0.02 packs per day. He has never used smokeless tobacco. He reports that he does not drink alcohol or use illicit drugs.   Family History:  The patient's family history includes Cancer (age of onset: 20) in his father; Dementia in his mother; Heart disease in his brother; Lung cancer in his sister; Throat cancer in his father.    ROS: All  other systems are reviewed and negative. Unless otherwise mentioned in H&P    PHYSICAL EXAM: VS:  BP 136/76 mmHg  Pulse 64  Ht 5\' 11"  (1.803 m)  Wt 153 lb (69.4 kg)  BMI 21.35 kg/m2  SpO2 97% , BMI Body mass index is 21.35 kg/(m^2). GEN: Well nourished, well developed, in no acute distress HEENT: normal Neck: no JVD, carotid bruits, or masses Cardiac: RRR;distant heart sounds, no murmurs, rubs, or gallops,no edema  Respiratory:  clear to auscultation bilaterally, normal work of breathing GI: soft, nontender, nondistended, + BS MS: no deformity or atrophy Skin: warm and dry, no rash Neuro:  Strength and sensation are intact Psych: euthymic mood, full affect difficult historian, and very hard of hearing    Recent Labs: 01/06/2015: B Natriuretic Peptide 664.0* 05/27/2015: TSH 2.875 10/27/2015: ALT 16*; BUN 25*; Creatinine, Ser 0.99; Hemoglobin 10.9*; Platelets 95*; Potassium 3.8; Sodium 140    Lipid Panel    Component Value Date/Time   CHOL 179 06/19/2013 1241   TRIG 66 06/19/2013 1241   HDL 51 06/19/2013 1241   CHOLHDL 3.5 06/19/2013 1241   VLDL 13 06/19/2013 1241   LDLCALC 115* 06/19/2013 1241      Wt Readings from Last 3 Encounters:  12/15/15 153 lb (69.4 kg)  11/12/15 155 lb (70.308 kg)  11/03/15 155 lb (70.308 kg)     ASSESSMENT AND PLAN:  1. Pulmonary hypertension: This may be the etiology of his chronic shortness of breath and fatigue.  The patient is on amlodipine 2.5 mg daily.  O2 sat is 97%.  2. Iron deficiency anemia: Because  of worsening dyspnea, will check a CBC.  He is due to have one drawn before he is seen by hematology in a couple of, weeks.  We will go ahead and get this done sooner.  Most recent hemoglobin was 10.9, with hematocrit of 32.0 on 10/27/2015.we will send copy to hematology.  3.Chronic dyspnea with fatigue: we will plan a chest x-ray to evaluate for CHF in the setting of systolic dysfunction.  He is currently not on any diuretics, and does  not appear to be fluid overloaded.  4. ICD in situ:he was last seen in the office in August of 2016.  They had planned to have a remote checks of his pacemaker, but his wife refused, the external pacemaker interrogation device.  There do to be in the pacemaker clinic in the next 2 weeks, will be trained on the use of the device, MA.  Take at home with them to make it easier.    Current medicines are reviewed at length with the patient today.    Labs/ tests ordered today include: CXR CBC Orders Placed This Encounter  Procedures  . DG Chest 2 View  . CBC with Differential     Disposition:     Follow-up with Dr. Harl Bowie in one month to be established with a local cardiologist for closer follow-up than when he is seen by Dr. Lovena Le   Signed, Jory Sims, NP  12/15/2015 3:53 PM    [facility]. [address], [place], [place] [address] Phone: [phone]; Fax: [phone]

## 2015-12-15 NOTE — Patient Instructions (Signed)
Medication Instructions:  Your physician recommends that you continue on your current medications as directed. Please refer to the Current Medication list given to you today.   Labwork: Your physician recommends that you return for lab work in: today cbc   Testing/Procedures: A chest x-ray takes a picture of the organs and structures inside the chest, including the heart, lungs, and blood vessels. This test can show several things, including, whether the heart is enlarges; whether fluid is building up in the lungs; and whether pacemaker / defibrillator leads are still in place.   Follow-Up: Your physician recommends that you schedule a follow-up appointment in: 1 month with Dr. Harl Bowie   Any Other Special Instructions Will Be Listed Below (If Applicable).     If you need a refill on your cardiac medications before your next appointment, please call your pharmacy.

## 2015-12-15 NOTE — Progress Notes (Deleted)
Name: [name]    DOB: [birth year]  Age: 82 y.o.  MR#: [id number]       PCP:  [name], MD      Insurance: Payor: MEDICARE / Plan: MEDICARE PART A AND B / Product Type: *No Product type* /   CC:   No chief complaint on file.   VS Filed Vitals:   12/15/15 1511  BP: 136/76  Pulse: 64  Height: 5\' 11"  (1.803 m)  Weight: 153 lb (69.4 kg)  SpO2: 97%    Weights Current Weight  12/15/15 153 lb (69.4 kg)  11/12/15 155 lb (70.308 kg)  11/03/15 155 lb (70.308 kg)    Blood Pressure  BP Readings from Last 3 Encounters:  12/15/15 136/76  11/12/15 126/74  11/03/15 129/82     Admit date:  (Not on file) Last encounter with RMR:  10/17/2015   Allergy Codeine and Penicillins  Current Outpatient Prescriptions  Medication Sig Dispense Refill  . amLODipine (NORVASC) 2.5 MG tablet TAKE 1 TABLET (2.5 MG TOTAL) BY MOUTH DAILY. 30 tablet 3  . CIALIS 20 MG tablet     . Diclofenac Sodium 1.5 % SOLN USE 2 DROPS 3 TIMES A DAY  0  . fentaNYL (DURAGESIC - DOSED MCG/HR) 25 MCG/HR patch Place 1 patch (25 mcg total) onto the skin every 3 (three) days. 10 patch 0  . hydrochlorothiazide (HYDRODIURIL) 12.5 MG tablet Take 1 tablet (12.5 mg total) by mouth daily. 30 tablet 6  . polyethylene glycol powder (GLYCOLAX/MIRALAX) powder Take 17 g by mouth daily. 3350 g 1  . traMADol (ULTRAM) 50 MG tablet Take 1 tablet (50 mg total) by mouth daily as needed for moderate pain. 30 tablet 2   No current facility-administered medications for this visit.    Discontinued Meds:   There are no discontinued medications.  Patient Active Problem List   Diagnosis Date Noted  . Pulmonary hypertension ([place]) 10/01/2015  . Fatigue 10/01/2015  . Chronic back pain 10/01/2015  . Myelodysplasia 06/20/2015  . Dyspnea 02/09/2015  . Recurrent falls 08/01/2014  . Atrial fibrillation, persistent ([place]) 02/22/2014  . Bilateral leg weakness 05/01/2013  . Abnormal gait 06/12/2012  . GERD (gastroesophageal reflux disease)  05/09/2012  . Alzheimer's dementia 11/17/2011  . Backache 02/18/2010  . GEN OSTEOARTHROSIS INVOLVING MULTIPLE SITES 11/25/2009  . CENTRAL HEARING LOSS 11/10/2009  . Biventricular cardiac pacemaker in situ 07/22/2009  . INTERSTITIAL CYSTITIS 07/14/2009  . Pancytopenia ([place]) 12/31/2008  . MITRAL REGURGITATION, MODERATE 12/31/2008  . Chronic systolic heart failure ([place]) 12/31/2008  . ERECTILE DYSFUNCTION 01/31/2008  . Glaucoma 01/31/2008  . Essential hypertension 01/31/2008    LABS    Component Value Date/Time   NA 140 10/27/2015 1054   NA 144 10/01/2015 1653   NA 140 09/03/2015 1014   K 3.8 10/27/2015 1054   K 4.2 10/01/2015 1653   K 3.9 09/03/2015 1014   CL 103 10/27/2015 1054   CL 108 10/01/2015 1653   CL 107 09/03/2015 1014   CO2 33* 10/27/2015 1054   CO2 26 10/01/2015 1653   CO2 28 09/03/2015 1014   GLUCOSE 98 10/27/2015 1054   GLUCOSE 88 10/01/2015 1653   GLUCOSE 121* 09/03/2015 1014   BUN 25* 10/27/2015 1054   BUN 23 10/01/2015 1653   BUN 25* 09/03/2015 1014   CREATININE 0.99 10/27/2015 1054   CREATININE 0.94 10/01/2015 1653   CREATININE 0.95 09/03/2015 1014   CREATININE 1.06 05/27/2015 1501   CREATININE 1.07 05/12/2015 1338   CREATININE 1.28  01/27/2015 1533   CALCIUM 8.7* 10/27/2015 1054   CALCIUM 8.5* 10/01/2015 1653   CALCIUM 8.7* 09/03/2015 1014   GFRNONAA >60 10/27/2015 1054   GFRNONAA >60 09/03/2015 1014   GFRNONAA >60 05/27/2015 1501   GFRAA >60 10/27/2015 1054   GFRAA >60 09/03/2015 1014   GFRAA >60 05/27/2015 1501   CMP     Component Value Date/Time   NA 140 10/27/2015 1054   K 3.8 10/27/2015 1054   CL 103 10/27/2015 1054   CO2 33* 10/27/2015 1054   GLUCOSE 98 10/27/2015 1054   BUN 25* 10/27/2015 1054   CREATININE 0.99 10/27/2015 1054   CREATININE 0.94 10/01/2015 1653   CALCIUM 8.7* 10/27/2015 1054   PROT 6.3* 10/27/2015 1054   ALBUMIN 3.6 10/27/2015 1054   AST 28 10/27/2015 1054   ALT 16* 10/27/2015 1054   ALKPHOS 60 10/27/2015 1054    BILITOT 1.1 10/27/2015 1054   GFRNONAA >60 10/27/2015 1054   GFRAA >60 10/27/2015 1054       Component Value Date/Time   WBC 1.9* 10/27/2015 1054   WBC 2.1* 10/01/2015 1653   WBC 2.3* 09/03/2015 1014   HGB 10.9* 10/27/2015 1054   HGB 10.6* 10/01/2015 1653   HGB 10.5* 09/03/2015 1014   HCT 32.0* 10/27/2015 1054   HCT 31.2* 10/01/2015 1653   HCT 31.2* 09/03/2015 1014   MCV 98.8 10/27/2015 1054   MCV 95.4 10/01/2015 1653   MCV 96.0 09/03/2015 1014    Lipid Panel     Component Value Date/Time   CHOL 179 06/19/2013 1241   TRIG 66 06/19/2013 1241   HDL 51 06/19/2013 1241   CHOLHDL 3.5 06/19/2013 1241   VLDL 13 06/19/2013 1241   LDLCALC 115* 06/19/2013 1241    ABG No results found for: PHART, PCO2ART, PO2ART, HCO3, TCO2, ACIDBASEDEF, O2SAT   Lab Results  Component Value Date   TSH 2.875 05/27/2015   BNP (last 3 results)  Recent Labs  01/06/15 1955  BNP 664.0*    ProBNP (last 3 results) No results for input(s): PROBNP in the last 8760 hours.  Cardiac Panel (last 3 results) No results for input(s): CKTOTAL, CKMB, TROPONINI, RELINDX in the last 72 hours.  Iron/TIBC/Ferritin/ %Sat    Component Value Date/Time   IRON 73 09/03/2015 1015   TIBC 332 09/03/2015 1015   FERRITIN 49 09/03/2015 1015   IRONPCTSAT 22 09/03/2015 1015     EKG Orders placed or performed in visit on 10/01/15  . EKG 12-Lead     Prior Assessment and Plan Problem List as of 12/15/2015      Cardiovascular and Mediastinum   MITRAL REGURGITATION, MODERATE   Last Assessment & Plan 08/08/2014 Office Visit Written 08/08/2014  4:06 PM by Lendon Colonel, NP    No apparent for repair or surgery at this time. Multiple comorbidities. Continue medical management.      Essential hypertension   Last Assessment & Plan 11/12/2015 Office Visit Written 11/17/2015  1:55 PM by Fayrene Helper, MD    Controlled, no change in medication       Chronic systolic heart failure [facility])   Last Assessment &  Plan 11/12/2015 Office Visit Written 11/17/2015  2:02 PM by Fayrene Helper, MD    Not decompensated currently, followed by cardiology in frequent basis      Atrial fibrillation, persistent [facility])   Last Assessment & Plan 10/01/2015 Office Visit Written 10/01/2015  3:45 PM by Erlene Quan, PA-C    Unable to tolerate  anticoagulation secondary to GI bleeding CHADs VASc=3      Pulmonary hypertension [facility]   Last Assessment & Plan 10/01/2015 Office Visit Written 10/01/2015  3:47 PM by Erlene Quan, PA-C    PA pressure 69 mmHg Oct 2016- echo        Digestive   GERD (gastroesophageal reflux disease)   Last Assessment & Plan 09/03/2013 Office Visit Written 09/09/2013  2:31 PM by Fayrene Helper, MD    Controlled, no change in medication Followed by GI        Nervous and Auditory   CENTRAL HEARING LOSS   Last Assessment & Plan 12/28/2013 Office Visit Written 12/29/2013  9:11 AM by Fayrene Helper, MD    needs hearing aids, will advise checking belltone  For help with this      Alzheimer's dementia   Last Assessment & Plan 11/12/2015 Office Visit Written 11/17/2015  2:01 PM by Fayrene Helper, MD    No behavioral issues currently, stable and on no medication      Bilateral leg weakness   Last Assessment & Plan 09/09/2014 Office Visit Written 02/02/2015 10:59 AM by Fayrene Helper, MD    Improved with recent in house therapy, he is to continue out pt in home PT/OT  For next approx 4 weeks      Myelodysplasia   Last Assessment & Plan 11/03/2015 Office Visit Edited 11/03/2015  3:28 PM by Baird Cancer, PA-C    Pancytopenia in the setting of a bone marrow aspiration and biopsy on 0000000 by Dr. Sterling Big at [facility] demonstrating MDS with 22q deletion, normocellular marrow at 25% without evidence of melanoma, lymphoma, or morphologic evidence of MDS.  However, decreased iron stores were noted.  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  In November, his ferritin was less than 100 and his calculated iron deficit was approximately 370 mg.  He was therefore given ferric gluconate 125 mg x 2.   Oncology Flowsheet 09/08/2015 09/15/2015  ferric gluconate (NULECIT) IV 125 mg 125 mg    At this time, there is no need for ESA therapy.  However, if he anemia progresses, we could absolutely consider ESA treatment.  His HGB is stable without any significant change.  Leukopenia is noted.  No recent infections or hospitalizations associated with infections.  If this becomes an issue GSF support can be considered.  Not necessary now.  Stable.  Neutrophil count is stable without any major changes.  Re-dressing of right upper arm completed by myself and nursing.  Cutting of scab from Band-Aid was completed.  New dressing placed and recommended he keep dressing in place for 48 hours.  Labs in 8 weeks: CBC diff, CMET, iron/TIBC, ferritin.  Return in 8 weeks for follow-up.  Chart is reviewed.  I agree with SNF placement and will defer to primary care provider.        Musculoskeletal and Integument   GEN OSTEOARTHROSIS INVOLVING MULTIPLE SITES   Last Assessment & Plan 06/12/2015 Office Visit Written 07/09/2015  8:31 PM by Fayrene Helper, MD    Continue current medication        Genitourinary   INTERSTITIAL CYSTITIS   Last Assessment & Plan 06/12/2015 Office Visit Written 07/09/2015  8:36 PM by Fayrene Helper, MD    Followed by urology has implanted device as well as is on medication for symptoms, still c/o excessive nocturia and wants to stop his diuretic,  the importance of maintaining medications as prescribed for heart failure protection is again discussed        Hematopoietic and Hemostatic   Pancytopenia [facility])   Last Assessment & Plan 09/03/2015 Office Visit Written 09/03/2015 11:22 AM by Baird Cancer, PA-C    Secondary to MDS        Other   ERECTILE DYSFUNCTION   Last Assessment & Plan 05/10/2011 Office  Visit Written 05/10/2011 10:00 AM by Fayrene Helper, MD    Pt states that the cialis is not working as well, he still has a lot, advised him to discuss further with his urologist      Glaucoma   Last Assessment & Plan 02/25/2015 Office Visit Written 04/04/2015 11:06 PM by Fayrene Helper, MD    Followed by opthalmology and compliant with drops      Backache   Last Assessment & Plan 02/25/2015 Office Visit Written 04/04/2015 11:07 PM by Fayrene Helper, MD    Pt has spinal stenosis, encouraged regular use as prescribed of his chronic pain medication      Biventricular cardiac pacemaker in situ   Last Assessment & Plan 11/12/2015 Office Visit Written 11/17/2015  2:09 PM by Fayrene Helper, MD    Current proposal per spouse is that she learn how to monitor from home, she is overwhelmed ans states she is incapable wants an alternative arrangement, requests [facility] help again      Abnormal gait   Last Assessment & Plan 11/12/2015 Office Visit Written 11/17/2015  2:01 PM by Fayrene Helper, MD    High fall risk and incapable of self care , will refer to [facility] for evaluation for any possible assistance, he lives with his spouse who is also aged and less capable of independent living, however , they both still prefer to live in their own home together at this time      Recurrent falls   Last Assessment & Plan 06/12/2015 Office Visit Written 07/09/2015  8:34 PM by Fayrene Helper, MD    Home safety reviewed, and importance of fall prevention stressed, including use of assistive device      Dyspnea   Last Assessment & Plan 01/06/2015 Office Visit Written 02/09/2015  5:28 PM by Fayrene Helper, MD    Recent increase with worsened  exercise tolerance and recurrent falls, pt sent to [facility] for further eval      Fatigue   Last Assessment & Plan 10/01/2015 Office Visit Edited 10/01/2015  4:01 PM by Erlene Quan, PA-C    His main complaint today is fatigue x 1 week. "just feel run down"      Chronic  back pain   Last Assessment & Plan 11/12/2015 Office Visit Written 11/17/2015  1:56 PM by Fayrene Helper, MD    Reports improved symptoms and less lower extremity pain with topical anti inflammatory from ortho          Imaging: No results found.

## 2015-12-17 ENCOUNTER — Telehealth: Payer: Self-pay | Admitting: *Deleted

## 2015-12-17 ENCOUNTER — Other Ambulatory Visit: Payer: Self-pay | Admitting: *Deleted

## 2015-12-17 DIAGNOSIS — Z79899 Other long term (current) drug therapy: Secondary | ICD-10-CM

## 2015-12-17 MED ORDER — FUROSEMIDE 20 MG PO TABS: ORAL_TABLET | ORAL | Status: DC

## 2015-12-17 NOTE — Telephone Encounter (Signed)
-----   Message from Lendon Colonel, NP sent at 12/16/2015 11:28 AM EDT ----- Discontinue HCTZ. Begin lasix 20 mg BID for two days and then 20 mg daily. Repeat BMET in one week.

## 2015-12-18 NOTE — Patient Outreach (Signed)
3/15- F/u call to pt as pt was a no show for home visit  3/8.  Spoke with pt and spouse, pt reports he forgot he had a MD visit on day RN CM was to come, agreed  to home visit 3/23.    [name].   [facility]  [phone]

## 2015-12-25 ENCOUNTER — Other Ambulatory Visit: Payer: Self-pay | Admitting: *Deleted

## 2015-12-25 ENCOUNTER — Ambulatory Visit: Payer: Self-pay | Admitting: *Deleted

## 2015-12-25 NOTE — Patient Outreach (Signed)
Arrived at pt's home for scheduled home visit.   Pt and spouse were in the car, spouse states have an emergency in [place], on their way now- had to call MD to get  RN CM's number- left message to cancel today's visit.    Provided spouse with RN CM's name and contact information.  As discussed, plan to call pt at a later time, reschedule home visit.     [name].   [facility]  [phone]

## 2015-12-29 ENCOUNTER — Encounter (HOSPITAL_COMMUNITY): Payer: Medicare Other | Attending: Hematology & Oncology | Admitting: Hematology & Oncology

## 2015-12-29 ENCOUNTER — Other Ambulatory Visit (HOSPITAL_COMMUNITY): Payer: Self-pay | Admitting: Oncology

## 2015-12-29 ENCOUNTER — Encounter (HOSPITAL_COMMUNITY): Payer: Self-pay | Admitting: Hematology & Oncology

## 2015-12-29 ENCOUNTER — Encounter (HOSPITAL_COMMUNITY): Payer: Medicare Other

## 2015-12-29 VITALS — HR 72 | Temp 97.9°F | Resp 18 | Wt 151.1 lb

## 2015-12-29 DIAGNOSIS — R5383 Other fatigue: Secondary | ICD-10-CM | POA: Insufficient documentation

## 2015-12-29 DIAGNOSIS — E876 Hypokalemia: Secondary | ICD-10-CM

## 2015-12-29 DIAGNOSIS — E611 Iron deficiency: Secondary | ICD-10-CM

## 2015-12-29 DIAGNOSIS — Z862 Personal history of diseases of the blood and blood-forming organs and certain disorders involving the immune mechanism: Secondary | ICD-10-CM | POA: Diagnosis not present

## 2015-12-29 DIAGNOSIS — D469 Myelodysplastic syndrome, unspecified: Principal | ICD-10-CM

## 2015-12-29 DIAGNOSIS — E291 Testicular hypofunction: Secondary | ICD-10-CM

## 2015-12-29 DIAGNOSIS — IMO0001 Reserved for inherently not codable concepts without codable children: Secondary | ICD-10-CM

## 2015-12-29 DIAGNOSIS — D649 Anemia, unspecified: Secondary | ICD-10-CM | POA: Diagnosis not present

## 2015-12-29 DIAGNOSIS — D61818 Other pancytopenia: Secondary | ICD-10-CM | POA: Insufficient documentation

## 2015-12-29 DIAGNOSIS — Z85820 Personal history of malignant melanoma of skin: Secondary | ICD-10-CM | POA: Diagnosis not present

## 2015-12-29 LAB — FERRITIN: Ferritin: 60 ng/mL (ref 24–336)

## 2015-12-29 LAB — CBC WITH DIFFERENTIAL/PLATELET
BASOS ABS: 0 10*3/uL (ref 0.0–0.1)
Basophils Relative: 1 %
EOS PCT: 3 %
Eosinophils Absolute: 0.1 10*3/uL (ref 0.0–0.7)
HEMATOCRIT: 32.9 % — AB (ref 39.0–52.0)
Hemoglobin: 11.4 g/dL — ABNORMAL LOW (ref 13.0–17.0)
LYMPHS ABS: 0.4 10*3/uL — AB (ref 0.7–4.0)
LYMPHS PCT: 18 %
MCH: 33 pg (ref 26.0–34.0)
MCHC: 34.7 g/dL (ref 30.0–36.0)
MCV: 95.4 fL (ref 78.0–100.0)
MONO ABS: 0.2 10*3/uL (ref 0.1–1.0)
MONOS PCT: 12 %
NEUTROS ABS: 1.3 10*3/uL — AB (ref 1.7–7.7)
Neutrophils Relative %: 66 %
Platelets: 76 10*3/uL — ABNORMAL LOW (ref 150–400)
RBC: 3.45 MIL/uL — ABNORMAL LOW (ref 4.22–5.81)
RDW: 13.3 % (ref 11.5–15.5)
WBC: 2 10*3/uL — ABNORMAL LOW (ref 4.0–10.5)

## 2015-12-29 LAB — BASIC METABOLIC PANEL
ANION GAP: 8 (ref 5–15)
BUN: 20 mg/dL (ref 6–20)
CALCIUM: 9 mg/dL (ref 8.9–10.3)
CO2: 31 mmol/L (ref 22–32)
Chloride: 102 mmol/L (ref 101–111)
Creatinine, Ser: 1.13 mg/dL (ref 0.61–1.24)
GFR calc Af Amer: >60 mL/min (ref >60)
GFR calc non Af Amer: 56 mL/min — ABNORMAL LOW (ref >60)
GLUCOSE: 125 mg/dL — AB (ref 65–99)
Potassium: 3.4 mmol/L — ABNORMAL LOW (ref 3.5–5.1)
Sodium: 141 mmol/L (ref 135–145)

## 2015-12-29 LAB — IRON AND TIBC
Iron: 78 ug/dL (ref 45–182)
SATURATION RATIOS: 25 % (ref 17.9–39.5)
TIBC: 308 ug/dL (ref 250–450)
UIBC: 230 ug/dL

## 2015-12-29 MED ORDER — POTASSIUM CHLORIDE CRYS ER 20 MEQ PO TBCR: 20.0000 meq | EXTENDED_RELEASE_TABLET | Freq: Every day | ORAL | Status: DC

## 2015-12-29 NOTE — Patient Instructions (Addendum)
[place] at [facility] Discharge Instructions  RECOMMENDATIONS MADE BY THE CONSULTANT AND ANY TEST RESULTS WILL BE SENT TO YOUR REFERRING PHYSICIAN.   Exam and discussion by Dr Whitney Muse today Labs are stable, potassium is a little low (make sure you are taking your potassium medication) Iron levels are still pending we will call you with those results  Return to see the doctor in 3 months with labs  Please call the clinic if you have any questions or concerns    Thank you for choosing [place] at [facility] to provide your oncology and hematology care.  To afford each patient quality time with our provider, please arrive at least 15 minutes before your scheduled appointment time.   Beginning January 23rd 2017 lab work for the [facility] will be done in the  Main lab at [facility] on 1st floor. If you have a lab appointment with the [facility] please come in thru the  Main Entrance and check in at the main information desk  You need to re-schedule your appointment should you arrive 10 or more minutes late.  We strive to give you quality time with our providers, and arriving late affects you and other patients whose appointments are after yours.  Also, if you no show three or more times for appointments you may be dismissed from the clinic at the providers discretion.     Again, thank you for choosing [facility].  Our hope is that these requests will decrease the amount of time that you wait before being seen by our physicians.       _____________________________________________________________  Should you have questions after your visit to [facility], please contact our office at [phone] between the hours of 8:30 a.m. and 4:30 p.m.  Voicemails left after 4:30 p.m. will not be returned until the following business day.  For prescription refill requests, have your pharmacy contact our office.          Resources For Cancer Patients and their Caregivers ? American Cancer Society: Can assist with transportation, wigs, general needs, runs Look Good Feel Better.        813-114-1951 ? Cancer Care: Provides financial assistance, online support groups, medication/co-pay assistance.  1-800-813-HOPE 820-258-4416) ? [place] Assists [place] cancer patients and their families through emotional , educational and financial support.  (213)454-4656 ? [place] DSS Where to apply for food stamps, Medicaid and utility assistance. 405-159-4267 ? [facility]: Transportation to medical appointments. (405) 821-9871 ? Social Security Administration: May apply for disability if have a Stage IV cancer. (702) 704-0265 (484)411-0755 ? [employer], Disability and Transit Services: Assists with nutrition, care and transit needs. (989) 631-5983

## 2015-12-29 NOTE — Progress Notes (Signed)
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 Decreased iron stores were noted. Cytogenetics showed 20% of the nuclei positive by fish with a 22 q deletion. No monosomy 7 is present.   HISTORY OF PRESENTING ILLNESS:  [name] 82 y.o. male is here for follow-up of MDS referred by Dr. Moshe Cipro.  The patient has a history of T3BNO melanoma of the L heal treated at [facility] in Jan of 2012.  The patient has MDS with 22Q deletion diagnosed and evaluated by Dr Sterling Big at [facility].  Mr. Friedland is here with his wife today. .  Mr. Zarrella is accompanied by his wife. I personally reviewed and went over laboratory studies with the patient.  His current complaint is one of the medications he is currently on makes him use the bathroom frequently. He wants to discontinue taking them due to this urinary frequency. Reports nocturia x4. He tried to negotiate taking this medication only once every few days rather than the prescribed dosage. His wife notes he tries not to take his medications as prescribed.  She also remarks she is having more difficulty caring for him because she is having her own health issues.  His wife notes he eats pretty good. He is taking black strap molasses daily to  maintain his iron.    MEDICAL HISTORY:  Past Medical History  Diagnosis Date  . Chronic systolic heart failure ([place])   . Rash and other nonspecific skin eruption   . Costochondritis, acute   . Glaucoma   . Erectile dysfunction   . Visual changes   . Pancytopenia   . Chronic pancreatitis ([place])     Based on CT findings  . History of melanoma in situ     JAN 2012--  LEFT HEEL W/ SLN BX  . Chronic back pain   . IC (interstitial cystitis)   . BPH (benign prostatic hypertrophy)   . Cardiac pacemaker     CARDIOLOGIST-- DR Cristopher Peru (LAST PACE [facility] 05-15-2013)  . Asymptomatic carotid artery stenosis     BILATERAL MILD ICA---  <50% PER DUPLEX 03-08-2008  . History of syncope   . Hypertension   . CHB (complete heart block) ([place])   . Nonischemic cardiomyopathy ([place])   . Severe mitral regurgitation   . Urge urinary incontinence   . A-fib ([place])   . Dementia   . Alzheimer disease     SURGICAL HISTORY: Past Surgical History  Procedure Laterality Date  . Cataract extraction, bilateral  left  1997/   right 2003  . Pacemaker insertion  12-06-2008  DR GREGG TAYLOR    BiV PPM  --  [employer]  . Colonoscopy  08/18/2006    DVV:OHYW granularity and erosions of the rectum of uncertain significance biopsied.  A long redundant, but otherwise normal-appearing colon.melanosi coli and minimal inflammation on bx  .  Inguinal hernia repair Bilateral AS TEEN  . Interstim implant placement  2001  &  2007  . Cysto/ hod/  replacement interstim implant  05-14-2003  . Removal interstim implant/  cyst/  hod  04-14-2004  . Revision interstim implant and replace generator  05-15-2009    left upper buttock for urge urinary incontinence  . Wide excision left heel and sln bx  JUNE 2012  . Tonsillectomy  1950  . Left elbow surgery  2002  . Back surgery  X3  LAST ONE'90's  . Transthoracic echocardiogram  12-06-2008    LVSF 55-60%/  MODERATE MR/  MILD AR/  QUESTION DISTAL POSTERIOR HYPOKINESIS  .  Interstim implant revision N/A 08/28/2013    Procedure: REPLACEMENT OF INTERSTIM-GENERATOR AND LEAD ;  Surgeon: [name], MD;  Location: [place];  Service: Urology;  Laterality: N/A;  . Esophagogastroduodenoscopy N/A 03/28/2014    Dr. [name]: normal esophagus s/p dilation, mosaic appearance - chronic inflammation noted on bx  . Savory dilation N/A 03/28/2014    Procedure: SAVORY DILATION;  Surgeon: [name], MD;  [facility];  Service: Endoscopy;  Laterality: N/A;  Venia Minks dilation N/A 03/28/2014    Procedure: Venia Minks DILATION;  Surgeon: [name], MD;  [facility];  Service: Endoscopy;  Laterality: N/A;  . Colonoscopy N/A 08/05/2014    Procedure: COLONOSCOPY;  Surgeon: [name], MD;  [facility];  Service: Endoscopy;  Laterality: N/A;  PT NEEDS TCS AT 1000.  . Implantable cardioverter defibrillator (icd) generator change N/A 10/11/2014    Procedure: ICD GENERATOR CHANGE;  Surgeon: [name], MD;  Location[facility] CATH LAB;  Service: Cardiovascular;  Laterality: N/A;    SOCIAL HISTORY: Social History   Social History  . Marital Status: Married    Spouse Name: N/A  . Number of Children: N/A  . Years of Education: N/A   Occupational History  . retired     Social History Main Topics  . Smoking status: Former Smoker -- 0.02 packs/day    Types: Cigarettes    Quit date: 08/24/1997  . Smokeless tobacco: Never Used  . Alcohol Use: No  . Drug Use: No  . Sexual Activity:    Partners: Male   Other Topics Concern  . 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 Dementia Mother   . Throat cancer Father   . Cancer Father 86    throat  . Lung cancer Sister   . Heart disease Brother    indicated that his mother is deceased. He indicated that his father is  deceased. He indicated that two of his five sisters are alive. He indicated that only one of his two brothers is alive.   ALLERGIES:  is allergic to codeine and penicillins. Father deceased at 58, Head and Neck Cancer Mother deceased at 59, Dementia 23 brothers and sisters; 3 brothers deceased, 2 sisters deceased    MEDICATIONS:  Current Outpatient Prescriptions  Medication Sig Dispense Refill  . amLODipine (NORVASC) 2.5 MG tablet TAKE 1 TABLET (2.5 MG TOTAL) BY MOUTH DAILY. 30 tablet 3  . CIALIS 20 MG tablet     . diclofenac sodium (VOLTAREN) 1 % GEL     . Diclofenac Sodium 1.5 % SOLN USE 2 DROPS 3 TIMES A DAY  0  . fentaNYL (DURAGESIC - DOSED MCG/HR) 25 MCG/HR patch Place 1 patch (25 mcg total) onto the skin every 3 (three) days. 10 patch 0  . furosemide (LASIX) 20 MG tablet TAKE ONE TABLET (20 MG) TWO TIMES A DAY FOR TWO DAYS, AND THEN START ONE TABLET (20 MG) ONE TIME DAILY 90 tablet 3  . hydrochlorothiazide (HYDRODIURIL) 12.5 MG tablet TAKE 1 TABLET (12.5 MG TOTAL) BY MOUTH DAILY.  6  . polyethylene glycol powder (GLYCOLAX/MIRALAX) powder Take 17 g by mouth daily. 3350 g 1  . traMADol (ULTRAM) 50 MG tablet Take 1 tablet (50 mg total) by mouth daily as needed for moderate pain. 30 tablet 2  . Meth-Hyo-M Bl-Na Phos-Ph Sal (URIBEL) 118 MG CAPS Reported on 12/29/2015  1  . potassium chloride SA (K-DUR,KLOR-CON) 20 MEQ tablet Take 1 tablet (20 mEq total) by mouth daily. 30 tablet 0   No current facility-administered medications for this visit.    Review of Systems  Constitutional: Negative.        In wheelchair.  HENT: Negative.   Eyes:       Weakening  Respiratory: Negative.   Cardiovascular: Negative.   Urinary: Positive for frequency.      Frequency with prescription medication. Nocturia x4 Gastrointestinal: Negative.   Musculoskeletal: Positive for weakness and back pain.      Weak legs      Back pain  Skin: Negative.   Neurological: Negative.   Endo/Heme/Allergies:  Negative.   Psychiatric/Behavioral: Negative.   All other systems reviewed and are negative.  14 point ROS was done and is otherwise as detailed above or in HPI   PHYSICAL EXAMINATION: ECOG PERFORMANCE STATUS: 2 - Symptomatic, <50% confined to bed  Filed Vitals:   12/29/15 1337  Pulse: 72  Temp: 97.9 F (36.6 C)  Resp: 18   Filed Weights   12/29/15 1337  Weight: 151 lb 1.6 oz (68.539 kg)    Physical Exam  Constitutional: He is oriented to person, place, and time and well-developed, well-nourished, and in no distress.   HENT:  Head: Normocephalic and atraumatic.  Eyes:  L eye, lazy  Neck: Normal range of motion. Neck supple.  Cardiovascular: Normal rate and regular rhythm.  Rare ectopy Palpable pacemaker, R chest Diastolic murmur  Pulmonary/Chest: Effort normal and breath sounds normal.  Abdominal: Soft. Bowel sounds are normal.  Musculoskeletal: Normal range of motion.  Ulnar deviation in both hands  Neurological: He is alert and oriented to person, place, and time. Interactive and appropriate in our conversation. Skin: Skin is warm and dry.    LABORATORY DATA:  I have reviewed the data as listed  Iron/TIBC/Ferritin/ %Sat    Component Value Date/Time   IRON 73 09/03/2015 1015   TIBC 332 09/03/2015 1015   FERRITIN 49 09/03/2015 1015   IRONPCTSAT 22 09/03/2015 1015   CBC    Component Value Date/Time   WBC 2.0* 12/29/2015 1313   RBC 3.45* 12/29/2015 1313   RBC 3.25* 09/03/2015 1014   HGB 11.4* 12/29/2015 1313   HCT 32.9* 12/29/2015 1313   PLT 76* 12/29/2015 1313   MCV 95.4 12/29/2015 1313   MCH 33.0 12/29/2015 1313   MCHC 34.7 12/29/2015 1313   RDW 13.3 12/29/2015 1313   LYMPHSABS 0.4* 12/29/2015 1313   MONOABS 0.2 12/29/2015 1313   EOSABS 0.1 12/29/2015 1313   BASOSABS 0.0 12/29/2015 1313   CMP     Component Value Date/Time   NA 141 12/29/2015 1313   K  3.4* 12/29/2015 1313   CL 102 12/29/2015 1313   CO2 31 12/29/2015 1313   GLUCOSE 125*  12/29/2015 1313   BUN 20 12/29/2015 1313   CREATININE 1.13 12/29/2015 1313   CREATININE 0.94 10/01/2015 1653   CALCIUM 9.0 12/29/2015 1313   PROT 6.3* 10/27/2015 1054   ALBUMIN 3.6 10/27/2015 1054   AST 28 10/27/2015 1054   ALT 16* 10/27/2015 1054   ALKPHOS 60 10/27/2015 1054   BILITOT 1.1 10/27/2015 1054   GFRNONAA 56* 12/29/2015 1313   GFRAA >60 12/29/2015 1313   Pathology review with pancytopenia TERPRETATION Interpretation Peripheral Blood Flow Cytometry - PREDOMINANCE OF T CELLS WITH NO ABNORMAL PHENOTYPE. - MINOR B-CELL POPULATION PRESENT. - SEE NOTE. Diagnosis Comment: Analysis of the lymphoid population shows predominance of T cells without an aberrant phenotype. B cells represent the minor population (less than 10% of lymphocytes) with expression of pan B cell antigens but with dim/negative staining for surface immunoglobulin light chains that hinder assessment and/or quantitation of clonality. However, an abnormal B-cell phenotype such as co-expression of CD5 and CD20 or CD10 expression is not identified. Clinical correlation is recommended. 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 Decreased iron stores were noted. Cytogenetics showed 20% of the nuclei positive by fish with a 22 q deletion. No monosomy 7 is present. Erythopoietin Level 29.7 mIU/ml Hypogonadism Low serum iron with ferritin of 51 ng/ml Low haptoglobin, mild elevation of LDH, normal bilirubin   Labs are stable. Iron levels are still pending. We will keep the patient and his wife advised of the results  when available.   I have encouraged the patient to take his medications as prescribed.  We discussed not stopping his medications without discussing it with his prescribing physicians. Per review of his chart, the patient and his wife have been offered assisted living but decline.   I will see him again for routine follow up in 3 months with labs. Potassium was adjusted due to mild hypokalemia today.  All questions were answered. The patient knows to call the clinic with any problems, questions or concerns.  This document serves as a record of services personally performed by Ancil Linsey, MD. It was created on her behalf by Arlyce Harman, a trained medical scribe. The creation of this record is based on the scribe's personal observations and the provider's statements to them. This document has been checked and approved by the attending provider.  I have reviewed the above documentation for accuracy and completeness, and I agree with the above.  This note was electronically signed.  Kelby Fam. Whitney Muse, MD

## 2015-12-31 ENCOUNTER — Ambulatory Visit (INDEPENDENT_AMBULATORY_CARE_PROVIDER_SITE_OTHER): Payer: Medicare Other | Admitting: Urology

## 2015-12-31 DIAGNOSIS — N3289 Other specified disorders of bladder: Secondary | ICD-10-CM

## 2015-12-31 DIAGNOSIS — R300 Dysuria: Secondary | ICD-10-CM | POA: Diagnosis not present

## 2016-01-05 ENCOUNTER — Other Ambulatory Visit: Payer: Self-pay | Admitting: *Deleted

## 2016-01-05 ENCOUNTER — Encounter: Payer: Self-pay | Admitting: *Deleted

## 2016-01-06 ENCOUNTER — Encounter: Payer: Self-pay | Admitting: *Deleted

## 2016-01-06 NOTE — Patient Outreach (Signed)
[facility]) Care Management   Home visit done 01/05/16  [name] [birth year] [id number]  [name] is an 82 y.o. male  Subjective:  Spouse reports pt is to have prostate surgery (laser) in MD office this month.  Pt reports he has not taken his water pill for 2 days- not feeling right, to talk to pharmacy about his medications.   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  Spouse reports pt no longer drives, she does his pill planner.    Objective:   Filed Vitals:   01/05/16 1046  BP: 110/80  Pulse: 63  Resp: 20    ROS  Physical Exam  Constitutional: He is oriented to person, place, and time. He appears well-developed and well-nourished.  Cardiovascular: Regular rhythm and normal heart sounds.   Respiratory: Effort normal.  Slight diminished   GI: Soft. Bowel sounds are normal.  Musculoskeletal: Normal range of motion.  Neurological: He is alert and oriented to person, place, and time.  Skin: Skin is warm and dry.  Psychiatric: He has a normal mood and affect. His behavior is normal. Judgment and thought content normal.    Encounter Medications:   Outpatient Encounter Prescriptions as of 01/05/2016  Medication Sig Note  . furosemide (LASIX) 20 MG tablet TAKE ONE TABLET (20 MG) TWO TIMES A DAY FOR TWO DAYS, AND THEN START ONE TABLET (20 MG) ONE TIME DAILY   . Meth-Hyo-M Bl-Na Phos-Ph Sal (URIBEL) 118 MG CAPS Reported on 12/29/2015 12/29/2015: Received from: External Pharmacy  . polyethylene glycol powder (GLYCOLAX/MIRALAX) powder Take 17 g by mouth daily.   . potassium chloride SA (K-DUR,KLOR-CON) 20 MEQ tablet Take 1 tablet (20 mEq total) by mouth daily.   . traMADol (ULTRAM) 50 MG tablet Take 1 tablet (50 mg total) by mouth daily as needed for moderate pain. 01/05/2016: Pt taking one tablet daily   . amLODipine (NORVASC) 2.5 MG tablet TAKE 1 TABLET (2.5 MG TOTAL) BY MOUTH DAILY. (Patient not taking: Reported on  01/05/2016)   . CIALIS 20 MG tablet Reported on 01/05/2016 11/03/2015: Received from: External Pharmacy  . diclofenac sodium (VOLTAREN) 1 % GEL Reported on 01/05/2016 12/29/2015: Received from: External Pharmacy  . Diclofenac Sodium 1.5 % SOLN Reported on 01/05/2016 11/12/2015: Received from: External Pharmacy  . fentaNYL (DURAGESIC - DOSED MCG/HR) 25 MCG/HR patch Place 1 patch (25 mcg total) onto the skin every 3 (three) days. (Patient not taking: Reported on 01/05/2016)   . hydrochlorothiazide (HYDRODIURIL) 12.5 MG tablet Reported on 01/05/2016 01/05/2016: Per note in Epic, Bunnie Domino stopped 12/17/15   No facility-administered encounter medications on file as of 01/05/2016.    Functional Status:   In your present state of health, do you have any difficulty performing the following activities: 01/05/2016 02/25/2015  Hearing? [name]  Vision? Y Y  Difficulty concentrating or making decisions? [name]  Walking or climbing stairs? Y Y  Dressing or bathing? N Y  Doing errands, shopping? Y Y  Preparing Food and eating ? - -  Using the Toilet? N -  In the past six months, have you accidently leaked urine? N -  Do you have problems with loss of bowel control? N -  Managing your Medications? Y -  Managing your Finances? N -  Housekeeping or managing your Housekeeping? N -    Fall/Depression Screening:    PHQ 2/9 Scores 01/05/2016 11/25/2015 04/11/2015 01/14/2015 01/14/2015 04/25/2013  PHQ - 2 Score 1 1  0 0 1 0    Assessment:   Note- [facility] was involved with pt in the past.   Was met by step daughter (visiting from [place]), inquired about Meals on Wheels in [place] for pt/spouse.  Provided step daughter with community resources in surrounding counties to f/u with, ask about   resources in [place].   HF-  No edema noted, pt not weighing on a regular basis, scale accurate but old- step daughter to purchase a new digital scale.   Review of pt's medications  with spouse + view of Epic note on 3/15 from Delena Bali NP - revealed spouse did not discontinue HCTZ (pt given 2 different doses of same medication), Lasix and Potassium not started, Norvasc needs refilling.    Note- Potassium rx started 3/27- PA-C at cancer center.                              Plan:   [facility] consent form updated to include pt's step daughter (from [place])- step daughter to have Norvasc refilled for pt/ pt to start back taking daily.              As discussed with spouse, pt to take medications as directed, RN CM filled pt's pill planner for a week,                 Provided spouse with large 7 day pill planner.                Step daughter to purchase new scale, pt to start weighing daily [facility] calendar provided), record,                 Bring results to next MD visit.                Pt to f/u on 4/12- urology procedure at [facility].                Plan to continue to provide community nurse case management services to pt, next home visit 5/1              Plan to inform Dr. Lodema Hong of [facility] involvement- in basket (barrier letter, home visit encounter).    [facility] CM Care Plan Problem One        Most Recent Value   Care Plan Problem One  Better management of HF related to hx of dementia, compliance with medication    Role Documenting the Problem One  Care Management Coordinator   Care Plan for Problem One  Active   [facility] Long Term Goal (31-90 days)  Spouse would be assist pt to better manage his HF in the next 60 days    [facility] Long Term Goal Start Date  01/05/16   Interventions for Problem One Long Term Goal  Provided spouse with HF information, discussed importance of pt weighing, medication and diet compliance     [facility] CM Short Term Goal #1 (0-30 days)  Pt would take all medications as prescribed for the next 30 days    [facility] CM Short Term Goal #1 Start Date  01/05/16   Interventions for Short Term Goal #1  Discussed with pt/spouse importance of medication compliance,- reviewed use of all medications, assisted spouse by filling  pt's pill planner for a week, provided large pill planner     [facility] CM Short Term Goal #2 (0-30 days)  Pt would weigh  daily on new scale, record, bring results to next MD visit    [facility] CM Short Term Goal #2 Start Date  01/05/16   Interventions for Short Term Goal #2  Educated pt/spouse importance of weighing, when to call MD, provided spouse with [facility] calendar to record pt's weights.       [name].   [place] [facility]  [phone]

## 2016-01-09 ENCOUNTER — Telehealth: Payer: Self-pay | Admitting: Urology

## 2016-01-09 NOTE — Telephone Encounter (Signed)
Pt advised of pre op date/time and sx date. Sx: 02/11/16 @ 12:15-1:15pm--[place]. Pre op: 02/06/16 @ 11:00am--[place]. Post Op: 02/18/16 @ 10:00am  No authorization is required for CPT: 52234--Ref # [id number] per Al.

## 2016-01-09 NOTE — Telephone Encounter (Signed)
error 

## 2016-01-14 ENCOUNTER — Ambulatory Visit: Payer: Medicare Other | Admitting: Urology

## 2016-01-14 DIAGNOSIS — N3010 Interstitial cystitis (chronic) without hematuria: Secondary | ICD-10-CM | POA: Diagnosis not present

## 2016-01-15 ENCOUNTER — Ambulatory Visit (INDEPENDENT_AMBULATORY_CARE_PROVIDER_SITE_OTHER): Payer: Medicare Other | Admitting: Orthopaedic Surgery

## 2016-01-15 ENCOUNTER — Encounter: Payer: Self-pay | Admitting: Orthopaedic Surgery

## 2016-01-15 VITALS — BP 129/74 | HR 74 | Temp 97.7°F | Resp 16 | Ht 71.0 in | Wt 152.0 lb

## 2016-01-15 DIAGNOSIS — I255 Ischemic cardiomyopathy: Secondary | ICD-10-CM

## 2016-01-15 DIAGNOSIS — M25511 Pain in right shoulder: Secondary | ICD-10-CM

## 2016-01-15 DIAGNOSIS — M25561 Pain in right knee: Secondary | ICD-10-CM

## 2016-01-16 ENCOUNTER — Encounter: Payer: Self-pay | Admitting: Orthopaedic Surgery

## 2016-01-16 NOTE — Progress Notes (Signed)
Patient [name] [name], male DOB:09-11-1926, 82 y.o. [id number]  Chief Complaint  Patient presents with  . Shoulder Pain    shoulder pain    HPI  [name] is a 82 y.o. male who has right shoulder pain.  He has pain with overhead lifting and at night rolling on his shoulder.  He has no new trauma.  He has no numbness.  He has tried heat, ice and tylenol with little help.  He also has right knee pain with swelling and popping.  He has no trauma no redness, no paresthesias.  He has no giving way.  He has also used heat, ice, rubs and tylenol with no help.   HPI  Body mass index is 21.21 kg/(m^2).  Review of Systems  HENT: Positive for congestion.   Eyes: Positive for visual disturbance.  Respiratory: Positive for cough and shortness of breath.   Cardiovascular: Negative for chest pain and leg swelling.  Endocrine: Positive for cold intolerance.  Musculoskeletal: Positive for myalgias, back pain, joint swelling, arthralgias and gait problem.  Allergic/Immunologic: Positive for environmental allergies.    Past Medical History  Diagnosis Date  . Chronic systolic heart failure ([place])   . Rash and other nonspecific skin eruption   . Costochondritis, acute   . Glaucoma   . Erectile dysfunction   . Visual changes   . Pancytopenia   . Chronic pancreatitis ([place])     Based on CT findings  . History of melanoma in situ     JAN 2012--  LEFT HEEL W/ SLN BX  . Chronic back pain   . IC (interstitial cystitis)   . BPH (benign prostatic hypertrophy)   . Cardiac pacemaker     CARDIOLOGIST-- DR Cristopher Peru (LAST PACE [facility] 05-15-2013)  . Asymptomatic carotid artery stenosis     BILATERAL MILD ICA---  <50% PER DUPLEX 03-08-2008  . History of syncope   . Hypertension   . CHB (complete heart block) ([place])   . Nonischemic cardiomyopathy ([place])   . Severe mitral regurgitation   . Urge urinary incontinence   . A-fib ([place])   . Dementia   . Alzheimer disease     Past Surgical History   Procedure Laterality Date  . Cataract extraction, bilateral  left  1997/   right 2003  . Pacemaker insertion  12-06-2008  DR GREGG TAYLOR    BiV PPM  --  [employer]  . Colonoscopy  08/18/2006    [id number] granularity and erosions of the rectum of uncertain significance biopsied.  A long redundant, but otherwise normal-appearing colon.melanosi coli and minimal inflammation on bx  . Inguinal hernia repair Bilateral AS TEEN  . Interstim implant placement  2001  &  2007  . Cysto/ hod/  replacement interstim implant  05-14-2003  . Removal interstim implant/  cyst/  hod  04-14-2004  . Revision interstim implant and replace generator  05-15-2009    left upper buttock for urge urinary incontinence  . Wide excision left heel and sln bx  JUNE 2012  . Tonsillectomy  1950  . Left elbow surgery  2002  . Back surgery  X3  LAST ONE'90's  . Transthoracic echocardiogram  12-06-2008    LVSF 55-60%/  MODERATE MR/  MILD AR/  QUESTION DISTAL POSTERIOR HYPOKINESIS  . Interstim implant revision N/A 08/28/2013    Procedure: REPLACEMENT OF INTERSTIM-GENERATOR AND LEAD ;  Surgeon: [name], MD;  Location: [facility];  Service: Urology;  Laterality: N/A;  . Esophagogastroduodenoscopy N/A  03/28/2014    Dr. [name]: normal esophagus s/p dilation, mosaic appearance - chronic inflammation noted on bx  . Savory dilation N/A 03/28/2014    Procedure: SAVORY DILATION;  Surgeon: [name], MD;  [facility];  Service: Endoscopy;  Laterality: N/A;  Venia Minks dilation N/A 03/28/2014    Procedure: Venia Minks DILATION;  Surgeon: [name], MD;  [facility];  Service: Endoscopy;  Laterality: N/A;  . Colonoscopy N/A 08/05/2014    Procedure: COLONOSCOPY;  Surgeon: [name], MD;  [facility];  Service: Endoscopy;  Laterality: N/A;  PT NEEDS TCS AT 1000.  . Implantable cardioverter defibrillator (icd) generator change N/A 10/11/2014    Procedure: ICD GENERATOR  CHANGE;  Surgeon: [name], MD;  Location: [facility] CATH LAB;  Service: Cardiovascular;  Laterality: N/A;    Family History  Problem Relation Age of Onset  . Dementia Mother   . Throat cancer Father   . Cancer Father 86    throat  . Lung cancer Sister   . Heart disease Brother     Social History Social History  Substance Use Topics  . Smoking status: Former Smoker -- 0.02 packs/day    Types: Cigarettes    Quit date: 08/24/1997  . Smokeless tobacco: Never Used  . Alcohol Use: No    Allergies  Allergen Reactions  . Codeine Diarrhea  . Penicillins     Other reaction(s): OTHER    Current Outpatient Prescriptions  Medication Sig Dispense Refill  . amLODipine (NORVASC) 2.5 MG tablet TAKE 1 TABLET (2.5 MG TOTAL) BY MOUTH DAILY. 30 tablet 3  . CIALIS 20 MG tablet Reported on 01/05/2016    . diclofenac sodium (VOLTAREN) 1 % GEL Reported on 01/05/2016    . Diclofenac Sodium 1.5 % SOLN Reported on 01/05/2016  0  . fentaNYL (DURAGESIC - DOSED MCG/HR) 25 MCG/HR patch Place 1 patch (25 mcg total) onto the skin every 3 (three) days. 10 patch 0  . furosemide (LASIX) 20 MG tablet TAKE ONE TABLET (20 MG) TWO TIMES A DAY FOR TWO DAYS, AND THEN START ONE TABLET (20 MG) ONE TIME DAILY 90 tablet 3  . hydrochlorothiazide (HYDRODIURIL) 12.5 MG tablet Reported on 01/05/2016  6  . Meth-Hyo-M Bl-Na Phos-Ph Sal (URIBEL) 118 MG CAPS Reported on 12/29/2015  1  . polyethylene glycol powder (GLYCOLAX/MIRALAX) powder Take 17 g by mouth daily. 3350 g 1  . potassium chloride SA (K-DUR,KLOR-CON) 20 MEQ tablet Take 1 tablet (20 mEq total) by mouth daily. 30 tablet 0  . traMADol (ULTRAM) 50 MG tablet Take 1 tablet (50 mg total) by mouth daily as needed for moderate pain. 30 tablet 2   No current facility-administered medications for this visit.     Physical Exam  Blood pressure 129/74, pulse 74, temperature 97.7 F (36.5 C), resp. rate 16, height 5\' 11"  (1.803 m), weight 152 lb (68.947 kg).  Constitutional:  overall normal hygiene, normal nutrition, well developed, normal grooming, normal body habitus. Assistive device:none  Musculoskeletal: gait and station Limp right, muscle tone and strength are normal, no tremors or atrophy is present.  .  Neurological: coordination overall normal.  Deep tendon reflex/nerve stretch intact.  Sensation normal.  Cranial nerves II-XII intact.   Skin:   Pacemaker scar on left upper chest, otherwise overall no scars, lesions, ulcers or rashes. No psoriasis.  Psychiatric: Alert and oriented x 3.  Recent memory intact, remote memory unclear.  Normal mood and affect. Well groomed.  Good eye contact.  Cardiovascular: overall no swelling, no varicosities, no edema bilaterally, normal temperatures of the legs and arms, no clubbing, cyanosis and good capillary refill.  Lymphatic: palpation is normal.  .Right Knee Exam   Tenderness  The patient is experiencing tenderness in the medial joint line.  Range of Motion  Extension: 0  Flexion: 100   Muscle Strength   The patient has normal right knee strength.  Tests  McMurray:  Medial - negative Lateral - negative Lachman:  Anterior - negative     Drawer:       Anterior - negative      Other  Erythema: absent Scars: absent Sensation: normal Pulse: present Swelling: mild   Left Knee Exam   Range of Motion  Extension: 0  Flexion: 110   Muscle Strength   The patient has normal left knee strength.  Tests  McMurray:  Medial - negative Lateral - negative Lachman:  Anterior - negative     Drawer:       Anterior - negative       Other  Erythema: absent Scars: absent Sensation: normal Pulse: present Swelling: none   Right Shoulder Exam   Tenderness  The patient is experiencing tenderness in the acromioclavicular joint.  Range of Motion  Active Abduction: 130  Passive Abduction: 150  Extension: 20  Forward Flexion: 160  External Rotation: 40   Muscle Strength  The patient has normal  right shoulder strength.  Other  Erythema: absent Scars: absent Sensation: normal Pulse: present   Left Shoulder Exam  Left shoulder exam is normal.    He has shortness of breath.  It is better over the last month.  He also has pacemaker with no chest pains.  He has hypertension well controlled.  He has no leg swelling and he watches his salt intake.  The patient has been educated about the nature of the problem(s) and counseled on treatment options.  The patient appeared to understand what I have discussed and is in agreement with it.  Encounter Diagnoses  Name Primary?  . Right knee pain Yes  . Right shoulder pain    PROCEDURE NOTE:  The patient request injection, verbal consent was obtained.  The right shoulder was prepped appropriately after time out was performed.   Sterile technique was observed and injection of 1 cc of Depo-Medrol 40 mg with several cc's of plain xylocaine. Anesthesia was provided by ethyl chloride and a 20-gauge needle was used to inject the shoulder area. A posterior approach was used.  The injection was tolerated well.  A band aid dressing was applied.  The patient was advised to apply ice later today and tomorrow to the injection sight as needed.  PROCEDURE NOTE:  The patient requests injections of the right knee , verbal consent was obtained.  The right knee was prepped appropriately after time out was performed.   Sterile technique was observed and injection of 1 cc of Depo-Medrol 40 mg with several cc's of plain xylocaine. Anesthesia was provided by ethyl chloride and a 20-gauge needle was used to inject the knee area. The injection was tolerated well.  A band aid dressing was applied.  The patient was advised to apply ice later today and tomorrow to the injection sight as needed. PLAN Call if any problems.  Precautions discussed.  Continue current medications.   Return to clinic 3 months

## 2016-01-29 ENCOUNTER — Telehealth: Payer: Self-pay

## 2016-01-29 NOTE — Telephone Encounter (Signed)
Wife called in stating [name] was c/o onset of blurry vision in right eye that started yesterday and weakness on the right side especially in his arm. Wife instructed to look for right sided face drooping when patient smiled which she did not see and no unusual cognitive features. Still advised to take patient to the ER to be evaluated for possible stroke. Wife understands.

## 2016-02-01 ENCOUNTER — Other Ambulatory Visit: Payer: Self-pay | Admitting: Family Medicine

## 2016-02-02 ENCOUNTER — Other Ambulatory Visit: Payer: Self-pay | Admitting: *Deleted

## 2016-02-02 NOTE — Patient Outreach (Signed)
[facility]) Care Management   02/02/2016  [name] Nov 09, 1925 [id number]  [name] is an 82 y.o. male  Subjective:   Pt reports doing fair, no c/o pain, to f/u with Urologist soon.  Spouse reports pt to have bladder surgery (5/10).    Spouse reports pt has not been weighing because he has no edema, daughter purchased a new scale, have not set it up yet.  Spouse reports Meals on Wheels person will be coming 5/8.   Spouse reports county transportation  Was set up, to use if needed when pt has MD appointments in [place].   Spouse reports daughter close by is helping as needed, pt's  MD appointments recorded so she can keep up them.  Pt reports he has not been taking his Potassium medication, makes him urinate at night.   Objective:   Filed Vitals:   02/02/16 1029 02/02/16 1102  BP: 100/70 100/70  Pulse: 63   Resp: 20     ROS  Physical Exam  Constitutional: He is oriented to person, place, and time. He appears well-developed and well-nourished.  Cardiovascular: Regular rhythm and normal heart sounds.   Pt has pacemaker, HR 63.   Respiratory: Effort normal and breath sounds normal.  GI: Soft. Bowel sounds are normal.  Musculoskeletal:  Gait unsteady when walking   Neurological: He is alert and oriented to person, place, and time.  Hx of Alzheimer- repeats himself.   Psychiatric: He has a normal mood and affect. His behavior is normal. Thought content normal.    Encounter Medications:  Reviewed with spouse (manages pt's medications) Outpatient Encounter Prescriptions as of 02/02/2016  Medication Sig Note  . amLODipine (NORVASC) 2.5 MG tablet TAKE 1 TABLET (2.5 MG TOTAL) BY MOUTH DAILY.   Marland Kitchen diclofenac sodium (VOLTAREN) 1 % GEL Reported on 01/05/2016   . fentaNYL (DURAGESIC - DOSED MCG/HR) 25 MCG/HR patch Place 1 patch (25 mcg total) onto the skin every 3 (three) days.   . furosemide (LASIX) 20 MG tablet TAKE ONE TABLET (20 MG) TWO TIMES A DAY FOR TWO DAYS, AND THEN  START ONE TABLET (20 MG) ONE TIME DAILY   . Meth-Hyo-M Bl-Na Phos-Ph Sal (URIBEL) 118 MG CAPS Reported on 12/29/2015 02/02/2016: Taking as needed   . polyethylene glycol powder (GLYCOLAX/MIRALAX) powder Take 17 g by mouth daily.   . traMADol (ULTRAM) 50 MG tablet Take 1 tablet (50 mg total) by mouth daily as needed for moderate pain. 01/05/2016: Pt taking one tablet daily   . CIALIS 20 MG tablet Reported on 02/02/2016   . Diclofenac Sodium 1.5 % SOLN Reported on 01/05/2016   . hydrochlorothiazide (HYDRODIURIL) 12.5 MG tablet Reported on 02/02/2016 01/05/2016: Per note in Epic, Bunnie Domino stopped 12/17/15  . Meth-Hyo-M Bl-Na Phos-Ph Sal (URIBEL PO) Take by mouth. Reported on 02/02/2016   . potassium chloride SA (K-DUR,KLOR-CON) 20 MEQ tablet Take 1 tablet (20 mEq total) by mouth daily. (Patient not taking: Reported on 02/02/2016) 02/02/2016: Pt reports cannot take medication.      No facility-administered encounter medications on file as of 02/02/2016.    Functional Status:   In your present state of health, do you have any difficulty performing the following activities: 01/05/2016 02/25/2015  Hearing? [name]  Vision? Y Y  Difficulty concentrating or making decisions? [name]  Walking or climbing stairs? Y Y  Dressing or bathing? N Y  Doing errands, shopping? Y Y  Preparing Food and eating ? - -  Using the Toilet?  N -  In the past six months, have you accidently leaked urine? N -  Do you have problems with loss of bowel control? N -  Managing your Medications? Y -  Managing your Finances? N -  Housekeeping or managing your Housekeeping? N -    Fall/Depression Screening:    PHQ 2/9 Scores 01/05/2016 11/25/2015 04/11/2015 01/14/2015 01/14/2015 04/25/2013  PHQ - 2 Score 1 1 0 0 1 0    Assessment:   82 year old gentleman, heard of hearing (hx of Alzheimer)- spouse present during home visit.                             HF- per spouse, pt not been weighing.   RN CM set up pt's scales, had pt weigh during visit- result                                 150 lbs. (after eating).   Lungs clear, no edema, no c/o sob, pain.                            Medication compliance- per pt not been taking his Potassium, spouse could not find rx bottle.                            As discussed with spouse, to let  Heart MD know tomorrow pt not taking Potassium.                               Discussed with spouse to keep medication bottles away from pt (hx of Alzheimer)   Plan: Pt to f/u with Heart MD 5/2, spouse to let MD know pt not taking Potassium            As discussed with spouse, pt to weigh 3 days a week, record, call MD for weight gain (>2 lbs in a day, 5 lbs in a week.              Pt to have pacemaker checked 5/4           Pt to have bladder surgery 5/10.               Plan to continue to provide community nurse case management services, f/u again 6/1- home visit.    [facility] CM Care Plan Problem One        Most Recent Value   Care Plan Problem One  Better management of HF related to hx of dementia, compliance with medication    Role Documenting the Problem One  Care Management Coordinator   Care Plan for Problem One  Active   [facility] Long Term Goal (31-90 days)  Spouse would be assist pt to better manage his HF in the next 60 days    [facility] Long Term Goal Start Date  01/05/16   Interventions for Problem One Long Term Goal  Provided spouse with HF information, discussed importance of pt weighing, medication and diet compliance     [facility] CM Short Term Goal #1 (0-30 days)  Pt would take all medications as prescribed for the next 30 days    [facility] CM Short Term Goal #1 Start Date  01/05/16   [facility] CM Short  Term Goal #1 Met Date  -- [not met- pt not been taking Potassium ]   Interventions for Short Term Goal #1  Discussed with pt/spouse importance of medication compliance,- reviewed use of all medications, assisted spouse by filling pt's pill planner for a week, provided large pill planner     [facility] CM Short Term Goal #2 (0-30 days)  Pt  would weigh daily on new scale, record, bring results to next MD visit    [facility] CM Short Term Goal #2 Start Date  01/05/16   [facility] CM Short Term Goal #2 Met Date  -- [not met- pt not been weighing ]   Interventions for Short Term Goal #2  Educated pt/spouse importance of weighing, when to call MD, provided spouse with [facility] calendar to record pt's weights.    [facility] CM Short Term Goal #3 (0-30 days)  Pt would be compliant with medications as prescirbed for the next 30 days    [facility] CM Short Term Goal #3 Start Date  02/02/16   Interventions for Short Tern Goal #3  Discussed with spouse to let Heart MD know at upcoming office visit 5/2- pt not taking his Potassium    [facility] CM Short Term Goal #4 (0-30 days)  Pt would weigh 3 times a week/record/call MD if weight gain of >2 lbs in a day, 5 lbs in a week.    [facility] CM Short Term Goal #4 Start Date  02/02/16   Interventions for Short Term Goal #4  Set up new scale,had pt weigh. As discussed with spouse, pt to weigh 3 days a week, record in [facility] calendar, call MD for weight gain of >2 lbs in a day, 5 lbs in a week.     [facility] CM Care Plan Problem Two        Most Recent Value   Care Plan Problem Two  j     [name].   [facility]  [phone]

## 2016-02-03 ENCOUNTER — Ambulatory Visit: Payer: Self-pay | Admitting: *Deleted

## 2016-02-03 ENCOUNTER — Encounter: Payer: Self-pay | Admitting: Cardiology

## 2016-02-03 ENCOUNTER — Ambulatory Visit (INDEPENDENT_AMBULATORY_CARE_PROVIDER_SITE_OTHER): Payer: Medicare Other | Admitting: Cardiology

## 2016-02-03 VITALS — BP 118/72 | HR 82 | Ht 67.0 in | Wt 154.0 lb

## 2016-02-03 DIAGNOSIS — I5022 Chronic systolic (congestive) heart failure: Secondary | ICD-10-CM

## 2016-02-03 DIAGNOSIS — I4819 Other persistent atrial fibrillation: Secondary | ICD-10-CM

## 2016-02-03 DIAGNOSIS — I340 Nonrheumatic mitral (valve) insufficiency: Secondary | ICD-10-CM | POA: Diagnosis not present

## 2016-02-03 DIAGNOSIS — I481 Persistent atrial fibrillation: Secondary | ICD-10-CM | POA: Diagnosis not present

## 2016-02-03 DIAGNOSIS — I255 Ischemic cardiomyopathy: Secondary | ICD-10-CM

## 2016-02-03 DIAGNOSIS — I071 Rheumatic tricuspid insufficiency: Secondary | ICD-10-CM

## 2016-02-03 NOTE — Progress Notes (Signed)
Patient ID: Zayde Laessig, male   DOB: 1925/11/14, 82 y.o.   MRN: [id number]     Clinical Summary Mr. Riesgo is a 82 y.o.male last seen by NP Purcell Nails, this is our first visit together. He is seen for the following medical problems.   1. Chronic systolic HF - mild LV systolic dysfunction by echo 2012 at 45%. Most recent echo 07/2015 LVEF 55-60%, no WMAs - denies any SOB or DOE recent, no LE edema - compliant with meds   2. Afib - not on anticoag due to history of GI bleed - intolerant to beta blockers due to side effects. Currently not requiring AV nodal agents.   3. Mitral regurgitation - moderate to severe by echo 07/2015 - denies any recent symptoms.   4. Tricuspid regurgitatoin - moderate to severe by echo 07/2015, PASP 69, mild RV dysfunction.  - denies any recent symptoms.   5. Biventricular pacemaker - device check coming up this week - no recent lightheadedness, no dizziness.   6. Preoperative evaluation - being considered for bladder surgery by Dr Alyson Ingles. The type of anethesia is unlcear at this time.  Past Medical History  Diagnosis Date  . Chronic systolic heart failure ([place])   . Rash and other nonspecific skin eruption   . Costochondritis, acute   . Glaucoma   . Erectile dysfunction   . Visual changes   . Pancytopenia   . Chronic pancreatitis ([place])     Based on CT findings  . History of melanoma in situ     JAN 2012--  LEFT HEEL W/ SLN BX  . Chronic back pain   . IC (interstitial cystitis)   . BPH (benign prostatic hypertrophy)   . Cardiac pacemaker     CARDIOLOGIST-- DR Cristopher Peru (LAST PACE [facility] 05-15-2013)  . Asymptomatic carotid artery stenosis     BILATERAL MILD ICA---  <50% PER DUPLEX 03-08-2008  . History of syncope   . Hypertension   . CHB (complete heart block) ([place])   . Nonischemic cardiomyopathy ([place])   . Severe mitral regurgitation   . Urge urinary incontinence   . A-fib ([place])   . Dementia   . Alzheimer disease      Allergies    Allergen Reactions  . Codeine Diarrhea  . Penicillins     Other reaction(s): OTHER     Current Outpatient Prescriptions  Medication Sig Dispense Refill  . amLODipine (NORVASC) 2.5 MG tablet TAKE 1 TABLET (2.5 MG TOTAL) BY MOUTH DAILY. 30 tablet 3  . CIALIS 20 MG tablet Reported on 02/02/2016    . diclofenac sodium (VOLTAREN) 1 % GEL Reported on 01/05/2016    . Diclofenac Sodium 1.5 % SOLN Reported on 01/05/2016  0  . fentaNYL (DURAGESIC - DOSED MCG/HR) 25 MCG/HR patch Place 1 patch (25 mcg total) onto the skin every 3 (three) days. 10 patch 0  . furosemide (LASIX) 20 MG tablet TAKE ONE TABLET (20 MG) TWO TIMES A DAY FOR TWO DAYS, AND THEN START ONE TABLET (20 MG) ONE TIME DAILY 90 tablet 3  . hydrochlorothiazide (HYDRODIURIL) 12.5 MG tablet Reported on 02/02/2016  6  . Meth-Hyo-M Bl-Na Phos-Ph Sal (URIBEL PO) Take by mouth. Reported on 02/02/2016    . Meth-Hyo-M Bl-Na Phos-Ph Sal (URIBEL) 118 MG CAPS Reported on 12/29/2015  1  . polyethylene glycol powder (GLYCOLAX/MIRALAX) powder Take 17 g by mouth daily. 3350 g 1  . potassium chloride SA (K-DUR,KLOR-CON) 20 MEQ tablet Take 1 tablet (20 mEq total) by  mouth daily. (Patient not taking: Reported on 02/02/2016) 30 tablet 0  . traMADol (ULTRAM) 50 MG tablet Take 1 tablet (50 mg total) by mouth daily as needed for moderate pain. 30 tablet 2   No current facility-administered medications for this visit.     Past Surgical History  Procedure Laterality Date  . Cataract extraction, bilateral  left  1997/   right 2003  . Pacemaker insertion  12-06-2008  DR GREGG TAYLOR    BiV PPM  --  [employer]  . Colonoscopy  08/18/2006    [id number] granularity and erosions of the rectum of uncertain significance biopsied.  A long redundant, but otherwise normal-appearing colon.melanosi coli and minimal inflammation on bx  . Inguinal hernia repair Bilateral AS TEEN  . Interstim implant placement  2001  &  2007  . Cysto/ hod/  replacement interstim implant  05-14-2003   . Removal interstim implant/  cyst/  hod  04-14-2004  . Revision interstim implant and replace generator  05-15-2009    left upper buttock for urge urinary incontinence  . Wide excision left heel and sln bx  JUNE 2012  . Tonsillectomy  1950  . Left elbow surgery  2002  . Back surgery  X3  LAST ONE'90's  . Transthoracic echocardiogram  12-06-2008    LVSF 55-60%/  MODERATE MR/  MILD AR/  QUESTION DISTAL POSTERIOR HYPOKINESIS  . Interstim implant revision N/A 08/28/2013    Procedure: REPLACEMENT OF INTERSTIM-GENERATOR AND LEAD ;  Surgeon: [name], MD;  Location: [place];  Service: Urology;  Laterality: N/A;  . Esophagogastroduodenoscopy N/A 03/28/2014    Dr. [name]: normal esophagus s/p dilation, mosaic appearance - chronic inflammation noted on bx  . Savory dilation N/A 03/28/2014    Procedure: SAVORY DILATION;  Surgeon: [name], MD;  [facility];  Service: Endoscopy;  Laterality: N/A;  Venia Minks dilation N/A 03/28/2014    Procedure: Venia Minks DILATION;  Surgeon: [name], MD;  [facility];  Service: Endoscopy;  Laterality: N/A;  . Colonoscopy N/A 08/05/2014    Procedure: COLONOSCOPY;  Surgeon: [name], MD;  [facility];  Service: Endoscopy;  Laterality: N/A;  PT NEEDS TCS AT 1000.  . Implantable cardioverter defibrillator (icd) generator change N/A 10/11/2014    Procedure: ICD GENERATOR CHANGE;  Surgeon: [name], MD;  Location: [facility] CATH LAB;  Service: Cardiovascular;  Laterality: N/A;     Allergies  Allergen Reactions  . Codeine Diarrhea  . Penicillins     Other reaction(s): OTHER      Family History  Problem Relation Age of Onset  . Dementia Mother   . Throat cancer Father   . Cancer Father 86    throat  . Lung cancer Sister   . Heart disease Brother      Social History Mr. Wilmoth reports that he quit smoking about 18 years ago. His smoking use included Cigarettes. He smoked 0.02 packs per  day. He has never used smokeless tobacco. Mr. Dekay reports that he does not drink alcohol.   Review of Systems CONSTITUTIONAL: No weight loss, fever, chills, weakness or fatigue.  HEENT: Eyes: No visual loss, blurred vision, double vision or yellow sclerae.No hearing loss, sneezing, congestion, runny nose or sore throat.  SKIN: No rash or itching.  CARDIOVASCULAR: per HPI RESPIRATORY: No shortness of breath, cough or sputum.  GASTROINTESTINAL: No anorexia, nausea, vomiting or diarrhea. No abdominal pain or blood.  GENITOURINARY: No burning  on urination, no polyuria NEUROLOGICAL: No headache, dizziness, syncope, paralysis, ataxia, numbness or tingling in the extremities. No change in bowel or bladder control.  MUSCULOSKELETAL: No muscle, back pain, joint pain or stiffness.  LYMPHATICS: No enlarged nodes. No history of splenectomy.  PSYCHIATRIC: No history of depression or anxiety.  ENDOCRINOLOGIC: No reports of sweating, cold or heat intolerance. No polyuria or polydipsia.  Marland Kitchen   Physical Examination Filed Vitals:   02/03/16 0921  BP: 118/72  Pulse: 82   Filed Vitals:   02/03/16 0921  Height: 5\' 7"  (1.702 m)  Weight: 154 lb (69.854 kg)    Gen: resting comfortably, no acute distress HEENT: no scleral icterus, pupils equal round and reactive, no palptable cervical adenopathy,  CV: irreg, 3/6 systolic murmur at apex Resp: Clear to auscultation bilaterally GI: abdomen is soft, non-tender, non-distended, normal bowel sounds, no hepatosplenomegaly MSK: extremities are warm, no edema.  Skin: warm, no rash Neuro:  no focal deficits Psych: appropriate affect      Assessment and Plan  1. Chronic systolic HF - now with normalized LVEF, no recent symptoms. Medical therapy limited due to side effects on beta blockers, no ARB/ACE or aldactone due to renal insffuciiency according to notes, however most recent GFR is 56. LVEF now normalized, will not alter meds at this time  2.  Afib - no anticoag due to prior GI bleed - not requiring av nodal agents, continue to monitor  3. Valvular heart disease - moderate mitral and tricuspid regurgitation, no recent symptoms. COntinue to monitor  4. BiV pacemaker - per Dr Lovena Le  5. Preoperative evaluation - being considered for TURP, I am not clear on the plans for type of anesthesia that is planned. Will contact urology. Recent echo with normal LVEF, he has not cardiac symptoms. Would recommend proceeding with surgery as planned. Additional cardiac testing would not affect treatment course.       Arnoldo Lenis, M.D.

## 2016-02-03 NOTE — Patient Instructions (Signed)
Medication Instructions:  Your physician recommends that you continue on your current medications as directed. Please refer to the Current Medication list given to you today.   Labwork: none  Testing/Procedures: none  Follow-Up: Your physician wants you to follow-up in: 4 months with Jory Sims, N.P.  You will receive a reminder letter in the mail two months in advance. If you don't receive a letter, please call our office to schedule the follow-up appointment.   Any Other Special Instructions Will Be Listed Below (If Applicable).   I WILL REQUEST YOUR RECORDS FROM DR. MACKENZIE  If you need a refill on your cardiac medications before your next appointment, please call your pharmacy.

## 2016-02-04 NOTE — Patient Instructions (Signed)
[name]  02/04/2016     @PREFPERIOPPHARMACY @   Your procedure is scheduled on 02/11/2016.  Report to [facility] at 10:30 A.M.  Call this number if you have problems the morning of surgery:  [phone]   Remember:  Do not eat food or drink liquids after midnight.  Take these medicines the morning of surgery with A SIP OF WATER Amlodipine, Tramadol   Do not wear jewelry, make-up or nail polish.  Do not wear lotions, powders, or perfumes.  You may wear deodorant.  Do not shave 48 hours prior to surgery.  Men may shave face and neck.  Do not bring valuables to the hospital.  [facility] is not responsible for any belongings or valuables.  Contacts, dentures or bridgework may not be worn into surgery.  Leave your suitcase in the car.  After surgery it may be brought to your room.  For patients admitted to the hospital, discharge time will be determined by your treatment team.  Patients discharged the day of surgery will not be allowed to drive home.    Please read over the following fact sheets that you were given. Surgical Site Infection Prevention and Anesthesia Post-op Instructions     PATIENT INSTRUCTIONS POST-ANESTHESIA  IMMEDIATELY FOLLOWING SURGERY:  Do not drive or operate machinery for the first twenty four hours after surgery.  Do not make any important decisions for twenty four hours after surgery or while taking narcotic pain medications or sedatives.  If you develop intractable nausea and vomiting or a severe headache please notify your doctor immediately.  FOLLOW-UP:  Please make an appointment with your surgeon as instructed. You do not need to follow up with anesthesia unless specifically instructed to do so.  WOUND CARE INSTRUCTIONS (if applicable):  Keep a dry clean dressing on the anesthesia/puncture wound site if there is drainage.  Once the wound has quit draining you may leave it open to air.  Generally you should leave the bandage intact for twenty four  hours unless there is drainage.  If the epidural site drains for more than 36-48 hours please call the anesthesia department.  QUESTIONS?:  Please feel free to call your physician or the hospital operator if you have any questions, and they will be happy to assist you.      Transurethral Resection, Bladder Tumor A cancerous growth (tumor) can develop on the inside wall of the bladder. The bladder is the organ that holds urine. One way to remove the tumor is a procedure called a transurethral resection. The tumor is removed (resected) through the tube that carries urine from the bladder out of the body (urethra). No cuts (incisions) are made in the skin. Instead, the procedure is done through a thin telescope, called a resectoscope. Attached to it is a light and usually a tiny camera. The resectoscope is put into the urethra. In men, the urethra opens at the end of the penis. In women, it opens just above the vagina.  A transurethral resection is usually used to remove tumors that have not gotten too big or too deep. These are called Stage 0, Stage 1 or Stage 2 bladder cancers. LET YOUR CAREGIVER KNOW ABOUT:  On the day of the procedure, your caregivers will need to know the last time you had anything to eat or drink. This includes water, gum, and candy. In advance, make sure they know about:   Any allergies.  All medications you are taking, including:  Herbs, eyedrops, over-the-counter medications  and creams.  Blood thinners (anticoagulants), aspirin or other drugs that could affect blood clotting.  Use of steroids (by mouth or as creams).  Previous problems with anesthetics, including local anesthetics.  Possibility of pregnancy, if this applies.  Any history of blood clots.  Any history of bleeding or other blood problems.  Previous surgery.  Smoking history.  Any recent symptoms of colds or infections.  Other health problems. RISKS AND COMPLICATIONS This is usually a safe  procedure. Every procedure has risks, though. For a transurethral resection, they include:  Infection. Antibiotic medication would need to be taken.  Bleeding.  Light bleeding may last for several days after the procedure.  If bleeding continues or is heavy, the bladder may need rinsing. Or, a new catheter might be put in for awhile.  Sometimes bed rest is needed.  Urination problems.  Pain and burning can occur when urinating. This usually goes away in a few days.  Scarring from the procedure can block the flow of urine.  Bladder damage.  It can be punctured or torn during removal of the tumor. If this happens, a catheter might be needed for longer. Antibiotics would be taken while the bladder heals.  Urine can leak through the hole or tear into the abdomen. If this happens, surgery may be needed to repair the bladder. BEFORE THE PROCEDURE   A medical evaluation will be done. This may include:  A physical examination.  Urine test. This is to make sure you do not have a urinary tract infection.  Blood tests.  A test that checks the heart's rhythm (electrocardiogram).  Talking with an anesthesiologist. This is the person who will be in charge of the medication (anesthesia) to keep you from feeling pain during the transurethral resection. You might be asleep during the procedure (general anesthesia) or numb from the waist down, but awake during the procedure (spinal anesthesia). Ask your surgeon what to expect.  The person who is having a transurethral resection needs to give what is called informed consent. This requires signing a legal paper that gives permission for the procedure. To give informed consent:  You must understand how the procedure is done and why.  You must be told all the risks and benefits of the procedure.  You must sign the consent. Sometimes a legal guardian can do this.  Signing should be witnessed by a healthcare professional.  The day before the  surgery, eat only a light dinner. Then, do not eat or drink anything for at least 8 hours before the surgery. Ask your caregiver if it is OK to take any needed medicines with a sip of water.  Arrive at least an hour before the surgery or whenever your surgeon recommends. This will give you time to check in and fill out any needed paperwork. PROCEDURE  The preparation:  You will change into a hospital gown.  A needle will be inserted in your arm. This is an intravenous access tube (IV). Medication will be able to flow directly into your body through this needle.  Small monitors will be put on your body. They are used to check your heart, blood pressure, and oxygen level.  You might be given medication that will help you relax (sedative).  You will be given a general anesthetic or spinal anesthesia.  The procedure:  Once you are asleep or numb from the waist down, your legs will be placed in stirrups.  The resectoscope will be passed through the urethra into the bladder.  Fluid will be passed through the resectoscope. This will fill the bladder with water.  The surgeon will examine the bladder through the scope. If the scope has a camera, it can take pictures from inside the bladder. They can be projected onto a TV screen.  The surgeon will use various tools to remove the tumor in small pieces. Sometimes a laser (a beam of light energy) is used. Other tools may use electric current.  A tube (catheter) will often be placed so that urine can drain into a bag outside the body. This process helps stop bleeding. This tube keeps blood clots from blocking the urethra.  The procedure usually takes 30 to 45 minutes. AFTER THE PROCEDURE   You will stay in a recovery area until the anesthesia has worn off. Your blood pressure and pulse will be checked every so often. Then you will be taken to a hospital room.  You may continue to get fluids through the IV for awhile.  Some pain is normal.  The catheter might be uncomfortable. Pain is usually not severe. If it is, ask for pain medicine.  Your urine may look bloody after a transurethral resection. This is normal.  If bleeding is heavy, a hospital caregiver may rinse out the bladder (irrigation) through the catheter.  Once the urine is clear, the catheter will be taken out.  You will need to stay in the hospital until you can urinate on your own.  Most people stay in the hospital for up to 4 days. PROGNOSIS   Transurethral resection is considered the best way to treat bladder tumors that are not too far along. For most people, the treatment is successful. Sometimes, though, more treatment is needed.  Bladder cancers can come back even after a successful procedure. Because of this, be sure to have a checkup with your caregiver every 3 to 6 months. If everything is OK for 3 years, you can reduce the checkups to once a year.   This information is not intended to replace advice given to you by your health care provider. Make sure you discuss any questions you have with your health care provider.   Document Released: 07/17/2009 Document Revised: 12/13/2011 Document Reviewed: 09/22/2009 Elsevier Interactive Patient Education [employer].

## 2016-02-05 ENCOUNTER — Other Ambulatory Visit: Payer: Self-pay

## 2016-02-05 ENCOUNTER — Ambulatory Visit (INDEPENDENT_AMBULATORY_CARE_PROVIDER_SITE_OTHER): Payer: Medicare Other | Admitting: Internal Medicine

## 2016-02-05 ENCOUNTER — Telehealth: Payer: Self-pay | Admitting: Family Medicine

## 2016-02-05 ENCOUNTER — Encounter: Payer: Self-pay | Admitting: Internal Medicine

## 2016-02-05 VITALS — BP 130/80 | HR 67 | Ht 69.0 in | Wt 156.0 lb

## 2016-02-05 DIAGNOSIS — Z950 Presence of cardiac pacemaker: Secondary | ICD-10-CM

## 2016-02-05 DIAGNOSIS — I255 Ischemic cardiomyopathy: Secondary | ICD-10-CM | POA: Diagnosis not present

## 2016-02-05 DIAGNOSIS — I5022 Chronic systolic (congestive) heart failure: Secondary | ICD-10-CM | POA: Diagnosis not present

## 2016-02-05 LAB — CUP PACEART INCLINIC DEVICE CHECK
Brady Statistic AP VP Percent: 21.18 %
Brady Statistic AP VS Percent: 0.02 %
Brady Statistic RV Percent Paced: 98.4 %
Date Time Interrogation Session: 20170504144217
Implantable Lead Implant Date: 20100305
Implantable Lead Implant Date: 20100305
Implantable Lead Location: 753858
Implantable Lead Location: 753859
Lead Channel Impedance Value: 209 Ohm
Lead Channel Impedance Value: 342 Ohm
Lead Channel Impedance Value: 361 Ohm
Lead Channel Impedance Value: 456 Ohm
Lead Channel Impedance Value: 456 Ohm
Lead Channel Setting Pacing Amplitude: 2.75 V
Lead Channel Setting Sensing Sensitivity: 5.6 mV
MDC IDC LEAD IMPLANT DT: 20100305
MDC IDC LEAD LOCATION: 753860
MDC IDC LEAD MODEL: 4194
MDC IDC MSMT BATTERY REMAINING LONGEVITY: 30 mo
MDC IDC MSMT BATTERY VOLTAGE: 2.98 V
MDC IDC MSMT LEADCHNL LV IMPEDANCE VALUE: 342 Ohm
MDC IDC MSMT LEADCHNL LV IMPEDANCE VALUE: 532 Ohm
MDC IDC MSMT LEADCHNL LV IMPEDANCE VALUE: 589 Ohm
MDC IDC MSMT LEADCHNL LV PACING THRESHOLD AMPLITUDE: 1 V
MDC IDC MSMT LEADCHNL LV PACING THRESHOLD PULSEWIDTH: 0.4 ms
MDC IDC MSMT LEADCHNL RA IMPEDANCE VALUE: 456 Ohm
MDC IDC MSMT LEADCHNL RA SENSING INTR AMPL: 0.125 mV
MDC IDC MSMT LEADCHNL RV PACING THRESHOLD AMPLITUDE: 1 V
MDC IDC MSMT LEADCHNL RV PACING THRESHOLD PULSEWIDTH: 0.4 ms
MDC IDC SET LEADCHNL LV PACING PULSEWIDTH: 0.4 ms
MDC IDC SET LEADCHNL RV PACING AMPLITUDE: 2.5 V
MDC IDC SET LEADCHNL RV PACING PULSEWIDTH: 0.4 ms
MDC IDC STAT BRADY AS VP PERCENT: 77.22 %
MDC IDC STAT BRADY AS VS PERCENT: 1.58 %
MDC IDC STAT BRADY RA PERCENT PACED: 21.2 %

## 2016-02-05 MED ORDER — FUROSEMIDE 20 MG PO TABS: ORAL_TABLET | ORAL | Status: DC

## 2016-02-05 MED ORDER — FENTANYL 25 MCG/HR TD PT72: 25.0000 ug | MEDICATED_PATCH | TRANSDERMAL | Status: DC

## 2016-02-05 NOTE — Progress Notes (Signed)
HPI [name] returns today for followup. He is a pleasant elderly man with chronic systolic CHF, non-ischemic CM, HTN and arthritis. He is s/p BiV PPM insertion. When I saw him last, his heart failure had worsened and he had developed worsening atrial fib. He was placed on amio, xarelto and lasix. He developed a GI bleed on his blood thinner and could not tolerate the lasix. He stopped both. No other complaints. In the interim, he has undergone change out of his BiV PPM. He notes worsening CHF symptoms. He has also been intolerant of beta blockers. He notes that there are days when he feels more sob. Allergies  Allergen Reactions  . Codeine Diarrhea  . Penicillins     Other reaction(s): OTHER     Current Outpatient Prescriptions  Medication Sig Dispense Refill  . amLODipine (NORVASC) 2.5 MG tablet TAKE 1 TABLET (2.5 MG TOTAL) BY MOUTH DAILY. 30 tablet 3  . diclofenac sodium (VOLTAREN) 1 % GEL Reported on 01/05/2016    . furosemide (LASIX) 20 MG tablet Take 20 mg by mouth daily.    . Meth-Hyo-M Bl-Na Phos-Ph Sal (URIBEL) 118 MG CAPS Reported on 12/29/2015  1  . polyethylene glycol powder (GLYCOLAX/MIRALAX) powder Take 17 g by mouth daily. 3350 g 1  . traMADol (ULTRAM) 50 MG tablet Take 1 tablet (50 mg total) by mouth daily as needed for moderate pain. 30 tablet 2   No current facility-administered medications for this visit.     Past Medical History  Diagnosis Date  . Chronic systolic heart failure ([place])   . Rash and other nonspecific skin eruption   . Costochondritis, acute   . Glaucoma   . Erectile dysfunction   . Visual changes   . Pancytopenia   . Chronic pancreatitis ([place])     Based on CT findings  . History of melanoma in situ     JAN 2012--  LEFT HEEL W/ SLN BX  . Chronic back pain   . IC (interstitial cystitis)   . BPH (benign prostatic hypertrophy)   . Cardiac pacemaker     CARDIOLOGIST-- DR Cristopher Peru (LAST [facility] 05-15-2013)  . Asymptomatic carotid artery  stenosis     BILATERAL MILD ICA---  <50% PER DUPLEX 03-08-2008  . History of syncope   . Hypertension   . CHB (complete heart block) ([place])   . Nonischemic cardiomyopathy ([place])   . Severe mitral regurgitation   . Urge urinary incontinence   . A-fib ([place])   . Dementia   . Alzheimer disease     ROS:   All systems reviewed and negative except as noted in the HPI.   Past Surgical History  Procedure Laterality Date  . Cataract extraction, bilateral  left  1997/   right 2003  . Pacemaker insertion  12-06-2008  DR Phyllistine Domingos    BiV PPM  --  [employer]  . Colonoscopy  08/18/2006    [id number] granularity and erosions of the rectum of uncertain significance biopsied.  A long redundant, but otherwise normal-appearing colon.melanosi coli and minimal inflammation on bx  . Inguinal hernia repair Bilateral AS TEEN  . Interstim implant placement  2001  &  2007  . Cysto/ hod/  replacement interstim implant  05-14-2003  . Removal interstim implant/  cyst/  hod  04-14-2004  . Revision interstim implant and replace generator  05-15-2009    left upper buttock for urge urinary incontinence  . Wide excision left heel and sln bx  JUNE 2012  .  Tonsillectomy  1950  . Left elbow surgery  2002  . Back surgery  X3  LAST ONE'90's  . Transthoracic echocardiogram  12-06-2008    LVSF 55-60%/  MODERATE MR/  MILD AR/  QUESTION DISTAL POSTERIOR HYPOKINESIS  . Interstim implant revision N/A 08/28/2013    Procedure: REPLACEMENT OF INTERSTIM-GENERATOR AND LEAD ;  Surgeon: [name], MD;  Location: [facility];  Service: Urology;  Laterality: N/A;  . Esophagogastroduodenoscopy N/A 03/28/2014    Dr. [name]: normal esophagus s/p dilation, mosaic appearance - chronic inflammation noted on bx  . Savory dilation N/A 03/28/2014    Procedure: SAVORY DILATION;  Surgeon: [name], MD;  [facility];  Service: Endoscopy;  Laterality: N/A;  Venia Minks dilation N/A 03/28/2014    Procedure:  Venia Minks DILATION;  Surgeon: [name], MD;  [facility];  Service: Endoscopy;  Laterality: N/A;  . Colonoscopy N/A 08/05/2014    Procedure: COLONOSCOPY;  Surgeon: [name], MD;  [facility];  Service: Endoscopy;  Laterality: N/A;  PT NEEDS TCS AT 1000.  . Implantable cardioverter defibrillator (icd) generator change N/A 10/11/2014    Procedure: ICD GENERATOR CHANGE;  Surgeon: [name], MD;  Location: [facility] CATH LAB;  Service: Cardiovascular;  Laterality: N/A;     Family History  Problem Relation Age of Onset  . Dementia Mother   . Throat cancer Father   . Cancer Father 86    throat  . Lung cancer Sister   . Heart disease Brother      Social History   Social History  . Marital Status: Married    Spouse Name: N/A  . Number of Children: N/A  . Years of Education: N/A   Occupational History  . retired     Social History Main Topics  . Smoking status: Former Smoker -- 0.02 packs/day    Types: Cigarettes    Quit date: 08/24/1997  . Smokeless tobacco: Never Used  . Alcohol Use: No  . Drug Use: No  . Sexual Activity:    Partners: Male   Other Topics Concern  . Not on file   Social History Narrative     BP 130/80 mmHg  Pulse 67  Ht 5\' 9"  (1.753 m)  Wt 156 lb (70.761 kg)  BMI 23.03 kg/m2  SpO2 97%  Physical Exam:  stable appearing elderly man, NAD HEENT: Unremarkable Neck:  8 cm JVD, no thyromegally Back:  No CVA tenderness Lungs:  Clear with no wheezes HEART:  Regular rate rhythm, grade 2/6 systolic murmur, no rubs, no clicks Abd:  soft, positive bowel sounds, no organomegally, no rebound, no guarding Ext:  2 plus pulses, no edema, no cyanosis, no clubbing Skin:  No rashes no nodules Neuro:  CN II through XII intact, motor grossly intact   DEVICE  Normal device function.  See PaceArt for details.   Assess/Plan: 1. Atrial fib - his ventricular rate is well controlled. He is not a candidate for coumadin. 2. Chronic  systolic heart failure - his symptoms are class 2B. He has some dyspnea and I have asked him to increase his dose of lasix as needed to 40 mg daily. 3. PPM - his device has been reprogrammed to VVIR from DDDR. Normal function. 4. HTN - his blood pressure is well controlled. He will maintain a low sodium diet.  [name].D.

## 2016-02-05 NOTE — Telephone Encounter (Signed)
Patient in to collect rx.

## 2016-02-05 NOTE — Telephone Encounter (Signed)
Patient is calling requesting a refill on Pain Patches, please advise?

## 2016-02-05 NOTE — Patient Instructions (Signed)
Your physician wants you to follow-up in: 1 Year with Dr. Lovena Le. You will receive a reminder letter in the mail two months in advance. If you don't receive a letter, please call our office to schedule the follow-up appointment.  Remote monitoring is used to monitor your Pacemaker of ICD from home. This monitoring reduces the number of office visits required to check your device to one time per year. It allows [place] to keep an eye on the functioning of your device to ensure it is working properly. You are scheduled for a device check from home on 05/06/16. You may send your transmission at any time that day. If you have a wireless device, the transmission will be sent automatically. After your physician reviews your transmission, you will receive a postcard with your next transmission date.   Your physician has recommended you make the following change in your medication:    Lasix 20 mg Take as Directed   If you need a refill on your cardiac medications before your next appointment, please call your pharmacy.  Thank you for choosing [facility]!

## 2016-02-06 ENCOUNTER — Other Ambulatory Visit: Payer: Self-pay

## 2016-02-06 ENCOUNTER — Encounter (HOSPITAL_COMMUNITY): Payer: Self-pay

## 2016-02-06 ENCOUNTER — Encounter (HOSPITAL_COMMUNITY)
Admission: RE | Admit: 2016-02-06 | Discharge: 2016-02-06 | Disposition: A | Discharge status: Home / Self Care | Payer: Medicare Other | Source: Ambulatory Visit | Attending: Urology | Admitting: Urology

## 2016-02-06 DIAGNOSIS — Z01812 Encounter for preprocedural laboratory examination: Secondary | ICD-10-CM | POA: Diagnosis not present

## 2016-02-06 DIAGNOSIS — D4959 Neoplasm of unspecified behavior of other genitourinary organ: Secondary | ICD-10-CM | POA: Diagnosis not present

## 2016-02-06 HISTORY — DX: Unspecified hearing loss, unspecified ear: H91.90

## 2016-02-06 HISTORY — DX: Unspecified osteoarthritis, unspecified site: M19.90

## 2016-02-06 HISTORY — DX: Other specified health status: Z78.9

## 2016-02-06 LAB — BASIC METABOLIC PANEL
ANION GAP: 5 (ref 5–15)
BUN: 19 mg/dL (ref 6–20)
CALCIUM: 8.5 mg/dL — AB (ref 8.9–10.3)
CO2: 31 mmol/L (ref 22–32)
Chloride: 106 mmol/L (ref 101–111)
Creatinine, Ser: 1.05 mg/dL (ref 0.61–1.24)
GFR calc Af Amer: >60 mL/min (ref >60)
GFR calc non Af Amer: >60 mL/min (ref >60)
Glucose, Bld: 102 mg/dL — ABNORMAL HIGH (ref 65–99)
POTASSIUM: 3.6 mmol/L (ref 3.5–5.1)
SODIUM: 142 mmol/L (ref 135–145)

## 2016-02-06 LAB — CBC WITH DIFFERENTIAL/PLATELET
BASOS ABS: 0 10*3/uL (ref 0.0–0.1)
Basophils Relative: 0 %
EOS ABS: 0.1 10*3/uL (ref 0.0–0.7)
EOS PCT: 3 %
HCT: 32.2 % — ABNORMAL LOW (ref 39.0–52.0)
Hemoglobin: 11 g/dL — ABNORMAL LOW (ref 13.0–17.0)
Lymphocytes Relative: 15 %
Lymphs Abs: 0.3 10*3/uL — ABNORMAL LOW (ref 0.7–4.0)
MCH: 33.1 pg (ref 26.0–34.0)
MCHC: 34.2 g/dL (ref 30.0–36.0)
MCV: 97 fL (ref 78.0–100.0)
Monocytes Absolute: 0.4 10*3/uL (ref 0.1–1.0)
Monocytes Relative: 16 %
Neutro Abs: 1.5 10*3/uL — ABNORMAL LOW (ref 1.7–7.7)
Neutrophils Relative %: 67 %
PLATELETS: 74 10*3/uL — AB (ref 150–400)
RBC: 3.32 MIL/uL — AB (ref 4.22–5.81)
RDW: 14.4 % (ref 11.5–15.5)
WBC: 2.3 10*3/uL — AB (ref 4.0–10.5)

## 2016-02-06 NOTE — Pre-Procedure Instructions (Signed)
Patient given information to sign up for my chart at home. 

## 2016-02-09 NOTE — Pre-Procedure Instructions (Signed)
Dr Milford Cage aware of extensive history and CBC. No orders given. Will proceed with surgery.

## 2016-02-10 NOTE — OR Nursing (Signed)
Office called  Spoke with Estill Bamberg who is texting Dr. Alyson Ingles to make sure he is aware of platelet count of 74, also asking about chemo agent if planning to give. She is to return my call at [phone]

## 2016-02-11 ENCOUNTER — Encounter (HOSPITAL_COMMUNITY): Payer: Self-pay | Admitting: *Deleted

## 2016-02-11 ENCOUNTER — Ambulatory Visit (HOSPITAL_COMMUNITY): Payer: Medicare Other | Admitting: Anesthesiology

## 2016-02-11 ENCOUNTER — Ambulatory Visit (HOSPITAL_COMMUNITY)
Admission: RE | Admit: 2016-02-11 | Discharge: 2016-02-11 | Disposition: A | Discharge status: Home / Self Care | Payer: Medicare Other | Source: Ambulatory Visit | Attending: Urology | Admitting: Urology

## 2016-02-11 ENCOUNTER — Ambulatory Visit (HOSPITAL_COMMUNITY): Payer: Medicare Other

## 2016-02-11 ENCOUNTER — Encounter (HOSPITAL_COMMUNITY)
Admission: RE | Disposition: A | Discharge status: Home / Self Care | Payer: Self-pay | Source: Ambulatory Visit | Attending: Urology

## 2016-02-11 DIAGNOSIS — N3090 Cystitis, unspecified without hematuria: Secondary | ICD-10-CM | POA: Diagnosis not present

## 2016-02-11 DIAGNOSIS — Z87891 Personal history of nicotine dependence: Secondary | ICD-10-CM | POA: Insufficient documentation

## 2016-02-11 DIAGNOSIS — I5022 Chronic systolic (congestive) heart failure: Secondary | ICD-10-CM | POA: Insufficient documentation

## 2016-02-11 DIAGNOSIS — M5440 Lumbago with sciatica, unspecified side: Secondary | ICD-10-CM

## 2016-02-11 DIAGNOSIS — C679 Malignant neoplasm of bladder, unspecified: Secondary | ICD-10-CM

## 2016-02-11 DIAGNOSIS — D494 Neoplasm of unspecified behavior of bladder: Secondary | ICD-10-CM | POA: Insufficient documentation

## 2016-02-11 DIAGNOSIS — Z85820 Personal history of malignant melanoma of skin: Secondary | ICD-10-CM | POA: Diagnosis not present

## 2016-02-11 DIAGNOSIS — D61818 Other pancytopenia: Secondary | ICD-10-CM | POA: Insufficient documentation

## 2016-02-11 DIAGNOSIS — I110 Hypertensive heart disease with heart failure: Secondary | ICD-10-CM | POA: Diagnosis not present

## 2016-02-11 DIAGNOSIS — M1991 Primary osteoarthritis, unspecified site: Secondary | ICD-10-CM | POA: Insufficient documentation

## 2016-02-11 DIAGNOSIS — I4891 Unspecified atrial fibrillation: Secondary | ICD-10-CM | POA: Diagnosis not present

## 2016-02-11 DIAGNOSIS — Z79899 Other long term (current) drug therapy: Secondary | ICD-10-CM | POA: Insufficient documentation

## 2016-02-11 HISTORY — PX: TRANSURETHRAL RESECTION OF BLADDER TUMOR WITH GYRUS (TURBT-GYRUS): SHX6458

## 2016-02-11 HISTORY — PX: CYSTOSCOPY W/ RETROGRADES: SHX1426

## 2016-02-11 SURGERY — TRANSURETHRAL RESECTION OF BLADDER TUMOR WITH GYRUS (TURBT-GYRUS)
Anesthesia: General | Site: Bladder

## 2016-02-11 MED ORDER — FENTANYL CITRATE (PF) 100 MCG/2ML IJ SOLN
INTRAMUSCULAR | Status: AC
  Filled 2016-02-11: qty 2

## 2016-02-11 MED ORDER — DIATRIZOATE MEGLUMINE 30 % UR SOLN
URETHRAL | Status: AC
  Filled 2016-02-11: qty 300

## 2016-02-11 MED ORDER — ONDANSETRON HCL 4 MG/2ML IJ SOLN: 4.0000 mg | Freq: Once | INTRAMUSCULAR | Status: DC | PRN

## 2016-02-11 MED ORDER — FENTANYL CITRATE (PF) 250 MCG/5ML IJ SOLN
INTRAMUSCULAR | Status: DC | PRN
  Administered 2016-02-11: 25 ug via INTRAVENOUS

## 2016-02-11 MED ORDER — CEFAZOLIN SODIUM 1-5 GM-% IV SOLN
1.0000 g | INTRAVENOUS | Status: AC
  Administered 2016-02-11: 1 g via INTRAVENOUS

## 2016-02-11 MED ORDER — URIBEL 118 MG PO CAPS: 1.0000 | ORAL_CAPSULE | Freq: Two times a day (BID) | ORAL | Status: AC | PRN

## 2016-02-11 MED ORDER — FENTANYL CITRATE (PF) 100 MCG/2ML IJ SOLN: 25.0000 ug | INTRAMUSCULAR | Status: DC | PRN

## 2016-02-11 MED ORDER — SODIUM CHLORIDE 0.9 % IJ SOLN
INTRAMUSCULAR | Status: AC
  Filled 2016-02-11: qty 10

## 2016-02-11 MED ORDER — FENTANYL CITRATE (PF) 250 MCG/5ML IJ SOLN
INTRAMUSCULAR | Status: AC
  Filled 2016-02-11: qty 5

## 2016-02-11 MED ORDER — CEFAZOLIN SODIUM 1-5 GM-% IV SOLN
INTRAVENOUS | Status: AC
  Filled 2016-02-11: qty 50

## 2016-02-11 MED ORDER — FENTANYL CITRATE (PF) 100 MCG/2ML IJ SOLN
25.0000 ug | INTRAMUSCULAR | Status: AC
  Administered 2016-02-11: 25 ug via INTRAVENOUS

## 2016-02-11 MED ORDER — LACTATED RINGERS IV SOLN
INTRAVENOUS | Status: DC
  Administered 2016-02-11: 11:00:00 via INTRAVENOUS

## 2016-02-11 MED ORDER — PROPOFOL 10 MG/ML IV BOLUS
INTRAVENOUS | Status: AC
  Filled 2016-02-11: qty 20

## 2016-02-11 MED ORDER — MIDAZOLAM HCL 2 MG/2ML IJ SOLN
INTRAMUSCULAR | Status: AC
  Filled 2016-02-11: qty 2

## 2016-02-11 MED ORDER — EPHEDRINE SULFATE 50 MG/ML IJ SOLN
INTRAMUSCULAR | Status: AC
  Filled 2016-02-11: qty 1

## 2016-02-11 MED ORDER — MIDAZOLAM HCL 2 MG/2ML IJ SOLN
1.0000 mg | INTRAMUSCULAR | Status: DC | PRN
  Administered 2016-02-11: 2 mg via INTRAVENOUS

## 2016-02-11 MED ORDER — TRAMADOL HCL 50 MG PO TABS: 50.0000 mg | ORAL_TABLET | ORAL | Status: DC | PRN

## 2016-02-11 MED ORDER — IOHEXOL 350 MG/ML SOLN
INTRAVENOUS | Status: DC | PRN
  Administered 2016-02-11: 8 mL

## 2016-02-11 MED ORDER — PROPOFOL 10 MG/ML IV BOLUS
INTRAVENOUS | Status: DC | PRN
  Administered 2016-02-11: 75 mg via INTRAVENOUS
  Administered 2016-02-11: 25 mg via INTRAVENOUS

## 2016-02-11 MED ORDER — STERILE WATER FOR IRRIGATION IR SOLN
Status: DC | PRN
  Administered 2016-02-11: 1000 mL

## 2016-02-11 MED ORDER — LIDOCAINE HCL (PF) 1 % IJ SOLN
INTRAMUSCULAR | Status: AC
  Filled 2016-02-11: qty 5

## 2016-02-11 MED ORDER — SODIUM CHLORIDE 0.9 % IR SOLN
Status: DC | PRN
  Administered 2016-02-11: 3000 mL

## 2016-02-11 SURGICAL SUPPLY — 27 items
BAG DRAIN URO TABLE W/ADPT NS (DRAPE) ×3 IMPLANT
BAG DRN 8 ADPR NS SKTRN CSTL (DRAPE) ×1
BAG HAMPER (MISCELLANEOUS) ×3 IMPLANT
BASKET ZERO TIP NITINOL 2.4FR (BASKET) IMPLANT
BSKT STON RTRVL ZERO TP 2.4FR (BASKET)
CATH FOLEY 3WAY 30CC 22F (CATHETERS) ×2 IMPLANT
CATH INTERMIT  6FR 70CM (CATHETERS) ×2 IMPLANT
CLOTH BEACON ORANGE TIMEOUT ST (SAFETY) ×3 IMPLANT
ELECT LOOP 22F BIPOLAR SML (ELECTROSURGICAL) ×3
ELECTRODE LOOP 22F BIPOLAR SML (ELECTROSURGICAL) IMPLANT
GLOVE BIO SURGEON STRL SZ7.5 (GLOVE) ×3 IMPLANT
GLOVE BIOGEL PI IND STRL 6.5 (GLOVE) IMPLANT
GLOVE BIOGEL PI IND STRL 7.0 (GLOVE) IMPLANT
GLOVE BIOGEL PI INDICATOR 6.5 (GLOVE) ×2
GLOVE BIOGEL PI INDICATOR 7.0 (GLOVE) ×2
GLOVE ECLIPSE 6.5 STRL STRAW (GLOVE) ×2 IMPLANT
GOWN STRL REUS W/ TWL LRG LVL3 (GOWN DISPOSABLE) ×2 IMPLANT
GOWN STRL REUS W/ TWL XL LVL3 (GOWN DISPOSABLE) ×1 IMPLANT
GOWN STRL REUS W/TWL LRG LVL3 (GOWN DISPOSABLE) ×6
GOWN STRL REUS W/TWL XL LVL3 (GOWN DISPOSABLE) ×3
GUIDEWIRE ANG ZIPWIRE 038X150 (WIRE) ×3 IMPLANT
GUIDEWIRE STR DUAL SENSOR (WIRE) IMPLANT
IV NS IRRIG 3000ML ARTHROMATIC (IV SOLUTION) ×3 IMPLANT
KIT ROOM TURNOVER AP CYSTO (KITS) ×3 IMPLANT
MANIFOLD NEPTUNE II (INSTRUMENTS) ×2 IMPLANT
PACK CYSTO (CUSTOM PROCEDURE TRAY) ×3 IMPLANT
PAD ARMBOARD 7.5X6 YLW CONV (MISCELLANEOUS) ×3 IMPLANT

## 2016-02-11 NOTE — Anesthesia Preprocedure Evaluation (Addendum)
Anesthesia Evaluation  Patient identified by MRN, date of birth, ID band Patient awake    Reviewed: Allergy & Precautions, H&P , NPO status , Patient's Chart, lab work & pertinent test results  Airway Mallampati: I  TM Distance: >3 FB Neck ROM: Limited   Comment: Can move head side to side, but ROM slightly limited. Dental  (+) Edentulous Upper, Partial Lower   Pulmonary shortness of breath, former smoker,    Pulmonary exam normal breath sounds clear to auscultation       Cardiovascular hypertension, Pt. on medications + Peripheral Vascular Disease and +CHF  Normal cardiovascular exam+ dysrhythmias + pacemaker  Rhythm:Regular Rate:Normal + Systolic murmurs- Diastolic murmurs    Neuro/Psych PSYCHIATRIC DISORDERS Chronic back pain. negative neurological ROS     GI/Hepatic Neg liver ROS, neg GERD  ,H/O chronic pancreatitis in the past.   Endo/Other  negative endocrine ROS  Renal/GU Bladder tumor   negative genitourinary   Musculoskeletal negative musculoskeletal ROS (+)   Abdominal   Peds negative pediatric ROS (+)  Hematology negative hematology ROS (+) anemia ,   Anesthesia Other Findings Not  Anticoagulated, plts=74,000  Reproductive/Obstetrics negative OB ROS                            Anesthesia Physical Anesthesia Plan  ASA: IV  Anesthesia Plan: General   Post-op Pain Management:    Induction: Intravenous  Airway Management Planned: LMA  Additional Equipment:   Intra-op Plan:   Post-operative Plan: Extubation in OR  Informed Consent: I have reviewed the patients History and Physical, chart, labs and discussed the procedure including the risks, benefits and alternatives for the proposed anesthesia with the patient or authorized representative who has indicated his/her understanding and acceptance.     Plan Discussed with:   Anesthesia Plan Comments:          Anesthesia Quick Evaluation

## 2016-02-11 NOTE — Transfer of Care (Signed)
Immediate Anesthesia Transfer of Care Note  Patient: [name]  Procedure(s) Performed: Procedure(s): TRANSURETHRAL RESECTION OF BLADDER TUMOR WITH GYRUS (TURBT-GYRUS) (N/A) CYSTOSCOPY WITH RETROGRADE PYELOGRAM (N/A)  Patient Location: PACU  Anesthesia Type:General  Level of Consciousness: awake and patient cooperative  Airway & Oxygen Therapy: Patient Spontanous Breathing and non-rebreather face mask  Post-op Assessment: Report given to RN, Post -op Vital signs reviewed and stable and Patient moving all extremities  Post vital signs: Reviewed and stable  Last Vitals:  Filed Vitals:   02/11/16 1130 02/11/16 1200  BP: 132/88 133/94  Temp:    Resp: 0 15    Last Pain: There were no vitals filed for this visit.       Complications: No apparent anesthesia complications

## 2016-02-11 NOTE — Anesthesia Procedure Notes (Signed)
Procedure Name: LMA Insertion Date/Time: 02/11/2016 12:26 PM Performed by: Tressie Stalker E Pre-anesthesia Checklist: Patient identified, Patient being monitored, Emergency Drugs available, Timeout performed and Suction available Patient Re-evaluated:Patient Re-evaluated prior to inductionOxygen Delivery Method: Circle System Utilized Preoxygenation: Pre-oxygenation with 100% oxygen Intubation Type: IV induction Ventilation: Mask ventilation without difficulty LMA: LMA inserted LMA Size: 4.0 Number of attempts: 1 Placement Confirmation: positive ETCO2 and breath sounds checked- equal and bilateral

## 2016-02-11 NOTE — Op Note (Signed)
.  Preoperative diagnosis: bladder tumor  Postoperative diagnosis: Same  Procedure: 1 cystoscopy 2. bilateral retrograde pyelography 3.  Intraoperative fluoroscopy, under one hour, with interpretation 4.  Transurethral resection of bladder tumor, medium  Attending: Rosie Fate  Anesthesia: General  Estimated blood loss: Minimal  Drains: 22 French foley  Specimens: bladder tumor  Antibiotics: ancef  Findings:   1cm bladder tumor in prostatic urethra. 3cm area of erythema at the bladder neck concerning for CIS. Ureteral orifices in normal anatomic location. No hydronephrosis or filling defects in either collecting system. Left duplicated system  Indications: Patient is a 82 year old male with a history of bladder tumor found on office cystoscopy.  After discussing treatment options, they decided proceed with transurethral resection of a bladder tumor.  Procedure her in detail: The patient was brought to the operating room and a brief timeout was done to ensure correct patient, correct procedure, correct site.  General anesthesia was administered patient was placed in dorsal lithotomy position.  Their genitalia was then prepped and draped in usual sterile fashion.  A rigid 57 French cystoscope was passed in the urethra and the bladder. We not a 1cm tumor in the prostatic urethra and a 3cm bladder tumor at the bladder neck.  the ureteral orifices were in the normal orthotopic locations.  a 6 french ureteral catheter was then instilled into the left ureteral orifice.  a gentle retrograde was obtained and findings noted above. We then turned our attention to the right side. a 6 french ureteral catheter was then instilled into the right ureteral orifice.  a gentle retrograde was obtained and findings noted above. We then removed the cystoscope and placed a resectoscope into the bladder. Using the bipolar resectoscope we removed the bladder tumor down to the base. Hemostasis was then obtained  with electrocautery. We then removed the bladder tumor chips and sent them for pathology. We then re-inspected the bladder and found no residula bleeding.  the bladder was then drained, a 22 French foley was placed and this concluded the procedure which was well tolerated by patient.  Complications: None  Condition: Stable, extubated, transferred to PACU  Plan: Patient is to be discharged home. He is to followup in 2 days for voiding trial.

## 2016-02-11 NOTE — Anesthesia Postprocedure Evaluation (Signed)
Anesthesia Post Note  Patient: [name]  Procedure(s) Performed: Procedure(s) (LRB): TRANSURETHRAL RESECTION OF BLADDER TUMOR WITH GYRUS (TURBT-GYRUS) (N/A) CYSTOSCOPY WITH RETROGRADE PYELOGRAM (N/A)  Patient location during evaluation: PACU Anesthesia Type: General Level of consciousness: awake and alert and awake Pain management: pain level controlled Vital Signs Assessment: post-procedure vital signs reviewed and stable Respiratory status: spontaneous breathing Cardiovascular status: blood pressure returned to baseline Postop Assessment: no signs of nausea or vomiting Anesthetic complications: no    Last Vitals:  Filed Vitals:   02/11/16 1312 02/11/16 1327  BP:    Pulse: 61 59  Temp: 36.9 C   Resp: 15 17    Last Pain:  Filed Vitals:   02/11/16 1332  PainSc: Asleep                 Britnie Colville

## 2016-02-11 NOTE — Brief Op Note (Signed)
02/11/2016  12:59 PM  PATIENT:  [name]  82 y.o. male  PRE-OPERATIVE DIAGNOSIS:  urethral prostatic ureteral tumor  POST-OPERATIVE DIAGNOSIS:  urethral prostatic ureteral tumo  PROCEDURE:  Procedure(s): TRANSURETHRAL RESECTION OF BLADDER TUMOR WITH GYRUS (TURBT-GYRUS) (N/A) CYSTOSCOPY WITH RETROGRADE PYELOGRAM (N/A)  SURGEON:  Surgeon(s) and Role:    * Cleon Gustin, MD - Primary  PHYSICIAN ASSISTANT:   ASSISTANTS: none   ANESTHESIA:   general  EBL:  Total I/O In: 400 [I.V.:400] Out: 0   BLOOD ADMINISTERED:none  DRAINS: Urinary Catheter (Foley)   LOCAL MEDICATIONS USED:  NONE  SPECIMEN:  Source of Specimen:  prostatic urethral tumor  DISPOSITION OF SPECIMEN:  PATHOLOGY  COUNTS:  YES  TOURNIQUET:  * No tourniquets in log *  DICTATION: .Note written in EPIC  PLAN OF CARE: Discharge to home after PACU  PATIENT DISPOSITION:  PACU - hemodynamically stable.   Delay start of Pharmacological VTE agent (>24hrs) due to surgical blood loss or risk of bleeding: not applicable

## 2016-02-11 NOTE — H&P (Signed)
Urology Admission H&P  Chief Complaint: dysuria  History of Present Illness: Mr [name] is a 82yo with a hx of dysuria who on office cystoscopy was found to have a prostatic urethral tumor. He has urgency, frequency. No hematuria  Past Medical History  Diagnosis Date  . Chronic systolic heart failure ([place])   . Rash and other nonspecific skin eruption   . Costochondritis, acute   . Glaucoma   . Erectile dysfunction   . Visual changes   . Pancytopenia   . Chronic pancreatitis ([place])     Based on CT findings  . History of melanoma in situ     JAN 2012--  LEFT HEEL W/ SLN BX  . Chronic back pain   . IC (interstitial cystitis)   . BPH (benign prostatic hypertrophy)   . Cardiac pacemaker     CARDIOLOGIST-- DR Cristopher Peru (LAST PACE [facility] 05-15-2013)  . Asymptomatic carotid artery stenosis     BILATERAL MILD ICA---  <50% PER DUPLEX 03-08-2008  . History of syncope   . Hypertension   . CHB (complete heart block) ([place])   . Nonischemic cardiomyopathy ([place])   . Severe mitral regurgitation   . Urge urinary incontinence   . A-fib ([place])   . Dementia   . Alzheimer disease   . Arthritis   . Cancer ([place])   . Poor historian   . HOH (hard of hearing)    Past Surgical History  Procedure Laterality Date  . Cataract extraction, bilateral  left  1997/   right 2003  . Pacemaker insertion  12-06-2008  DR GREGG TAYLOR    BiV PPM  --  [employer]  . Colonoscopy  08/18/2006    [id number] granularity and erosions of the rectum of uncertain significance biopsied.  A long redundant, but otherwise normal-appearing colon.melanosi coli and minimal inflammation on bx  . Inguinal hernia repair Bilateral AS TEEN  . Interstim implant placement  2001  &  2007  . Cysto/ hod/  replacement interstim implant  05-14-2003  . Removal interstim implant/  cyst/  hod  04-14-2004  . Revision interstim implant and replace generator  05-15-2009    left upper buttock for urge urinary incontinence  . Wide excision left  heel and sln bx  JUNE 2012  . Tonsillectomy  1950  . Left elbow surgery  2002  . Back surgery  X3  LAST ONE'90's  . Transthoracic echocardiogram  12-06-2008    LVSF 55-60%/  MODERATE MR/  MILD AR/  QUESTION DISTAL POSTERIOR HYPOKINESIS  . Interstim implant revision N/A 08/28/2013    Procedure: REPLACEMENT OF INTERSTIM-GENERATOR AND LEAD ;  Surgeon: [name], MD;  Location: [facility];  Service: Urology;  Laterality: N/A;  . Esophagogastroduodenoscopy N/A 03/28/2014    Dr. [name]: normal esophagus s/p dilation, mosaic appearance - chronic inflammation noted on bx  . Savory dilation N/A 03/28/2014    Procedure: SAVORY DILATION;  Surgeon: [name], MD;  [facility];  Service: Endoscopy;  Laterality: N/A;  Venia Minks dilation N/A 03/28/2014    Procedure: Venia Minks DILATION;  Surgeon: [name], MD;  [facility];  Service: Endoscopy;  Laterality: N/A;  . Colonoscopy N/A 08/05/2014    Procedure: COLONOSCOPY;  Surgeon: [name], MD;  [facility];  Service: Endoscopy;  Laterality: N/A;  PT NEEDS TCS AT 1000.  . Implantable cardioverter defibrillator (icd) generator change N/A 10/11/2014    Procedure: ICD GENERATOR CHANGE;  Surgeon:  [name], MD;  Location: [facility] C[facility];  Service: Cardiovascular;  Laterality: N/A;    Home Medications:  Prescriptions prior to admission  Medication Sig Dispense Refill Last Dose  . amLODipine (NORVASC) 2.5 MG tablet TAKE 1 TABLET (2.5 MG TOTAL) BY MOUTH DAILY. 30 tablet 3 02/10/2016 at Unknown time  . Capsaicin-Methyl Nicotinate (ARTH ARREST EX) Apply 1 application topically daily as needed (pain).     Marland Kitchen diclofenac sodium (VOLTAREN) 1 % GEL Apply 2 g topically daily as needed (pain). Reported on 01/05/2016   02/10/2016 at Unknown time  . fentaNYL (DURAGESIC - DOSED MCG/HR) 25 MCG/HR patch Place 1 patch (25 mcg total) onto the skin every 3 (three) days. 10 patch 0   . furosemide (LASIX) 20 MG tablet  TAKE AS DIRECTED (Patient taking differently: Take 20 mg by mouth daily. May take twice if needed.) 45 tablet 12   . ibuprofen (ADVIL,MOTRIN) 200 MG tablet Take 400 mg by mouth every 6 (six) hours as needed for moderate pain.     . Meth-Hyo-M Bl-Na Phos-Ph Sal (URIBEL) 118 MG CAPS Take 1 capsule by mouth 2 (two) times daily as needed (bladder).     . polyethylene glycol powder (GLYCOLAX/MIRALAX) powder Take 17 g by mouth daily. (Patient taking differently: Take 17 g by mouth daily as needed for mild constipation. ) 3350 g 1 Taking  . traMADol (ULTRAM) 50 MG tablet Take 1 tablet (50 mg total) by mouth daily as needed for moderate pain. (Patient taking differently: Take 50 mg by mouth every 4 (four) hours as needed for moderate pain. ) 30 tablet 2 Taking   Allergies:  Allergies  Allergen Reactions  . Codeine Diarrhea  . Penicillins     Other reaction(s): OTHER    Family History  Problem Relation Age of Onset  . Dementia Mother   . Throat cancer Father   . Cancer Father 86    throat  . Lung cancer Sister   . Heart disease Brother    Social History:  reports that he quit smoking about 18 years ago. His smoking use included Cigarettes. He has a .6 pack-year smoking history. He has never used smokeless tobacco. He reports that he does not drink alcohol or use illicit drugs.  Review of Systems  Genitourinary: Positive for dysuria and urgency.  All other systems reviewed and are negative.   Physical Exam:  Vital signs in last 24 hours: Temp:  [98.2 F (36.8 C)] 98.2 F (36.8 C) (05/10 1055) Resp:  [0-24] 24 (05/10 1100) BP: (128-139)/(86-92) 139/92 mmHg (05/10 1100) SpO2:  [90 %-96 %] 90 % (05/10 1100) Physical Exam  Constitutional: He is oriented to person, place, and time. He appears well-developed and well-nourished.  HENT:  Head: Atraumatic.  Eyes: EOM are normal. Pupils are equal, round, and reactive to light.  Neck: Normal range of motion. No thyromegaly present.   Cardiovascular: Normal rate and regular rhythm.   Respiratory: Effort normal. No respiratory distress.  GI: Soft. He exhibits no distension.  Musculoskeletal: Normal range of motion.  Neurological: He is alert and oriented to person, place, and time.  Skin: Skin is warm and dry.  Psychiatric: He has a normal mood and affect. His behavior is normal. Judgment and thought content normal.    Laboratory Data:  No results found for this or any previous visit (from the past 24 hour(s)). No results found for this or any previous visit (from the past 240 hour(s)). Creatinine:  Recent Labs  02/06/16  1125  CREATININE 1.05   Baseline Creatinine: 1  Impression/Assessment:  82yo with dysuria and urethral tumor  Plan:  The risks/benefits/alternatives to TURBT was explained to the patient and he understands and wishes to proceed with surgery  Nicolette Bang 02/11/2016, 12:04 PM

## 2016-02-11 NOTE — Discharge Instructions (Signed)
Foley Catheter Care, Adult °A Foley catheter is a soft, flexible tube that is placed into the bladder to drain urine. A Foley catheter may be inserted if: °· You leak urine or are not able to control when you urinate (urinary incontinence). °· You are not able to urinate when you need to (urinary retention). °· You had prostate surgery or surgery on the genitals. °· You have certain medical conditions, such as multiple sclerosis, dementia, or a spinal cord injury. °If you are going home with a Foley catheter in place, follow the instructions below. °TAKING CARE OF THE CATHETER °1. Wash your hands with soap and water. °2. Using mild soap and warm water on a clean washcloth: °¨ Clean the area on your body closest to the catheter insertion site using a circular motion, moving away from the catheter. Never wipe toward the catheter because this could sweep bacteria up into the urethra and cause infection. °¨ Remove all traces of soap. Pat the area dry with a clean towel. For males, reposition the foreskin. °3. Attach the catheter to your leg so there is no tension on the catheter. Use adhesive tape or a leg strap. If you are using adhesive tape, remove any sticky residue left behind by the previous tape you used. °4. Keep the drainage bag below the level of the bladder, but keep it off the floor. °5. Check throughout the day to be sure the catheter is working and urine is draining freely. Make sure the tubing does not become kinked. °6. Do not pull on the catheter or try to remove it. Pulling could damage internal tissues. °TAKING CARE OF THE DRAINAGE BAGS °You will be given two drainage bags to take home. One is a large overnight drainage bag, and the other is a smaller leg bag that fits underneath clothing. You may wear the overnight bag at any time, but you should never wear the smaller leg bag at night. Follow the instructions below for how to empty, change, and clean your drainage bags. °Emptying the Drainage  Bag °You must empty your drainage bag when it is  -½ full or at least 2-3 times a day. °1. Wash your hands with soap and water. °2. Keep the drainage bag below your hips, below the level of your bladder. This stops urine from going back into the tubing and into your bladder. °3. Hold the dirty bag over the toilet or a clean container. °4. Open the pour spout at the bottom of the bag and empty the urine into the toilet or container. Do not let the pour spout touch the toilet, container, or any other surface. Doing so can place bacteria on the bag, which can cause an infection. °5. Clean the pour spout with a gauze pad or cotton ball that has rubbing alcohol on it. °6. Close the pour spout. °7. Attach the bag to your leg with adhesive tape or a leg strap. °8. Wash your hands well. °Changing the Drainage Bag °Change your drainage bag once a month or sooner if it starts to smell bad or look dirty. Below are steps to follow when changing the drainage bag. °1. Wash your hands with soap and water. °2. Pinch off the rubber catheter so that urine does not spill out. °3. Disconnect the catheter tube from the drainage tube at the connection valve. Do not let the tubes touch any surface. °4. Clean the end of the catheter tube with an alcohol wipe. Use a different alcohol wipe to clean   the end of the drainage tube. 5. Connect the catheter tube to the drainage tube of the clean drainage bag. 6. Attach the new bag to the leg with adhesive tape or a leg strap. Avoid attaching the new bag too tightly. 7. Wash your hands well. Cleaning the Drainage Bag 1. Wash your hands with soap and water. 2. Wash the bag in warm, soapy water. 3. Rinse the bag thoroughly with warm water. 4. Fill the bag with a solution of white vinegar and water (1 cup vinegar to 1 qt warm water [.2 L vinegar to 1 L warm water]). Close the bag and soak it for 30 minutes in the solution. 5. Rinse the bag with warm water. 6. Hang the bag to dry with the  pour spout open and hanging downward. 7. Store the clean bag (once it is dry) in a clean plastic bag. 8. Wash your hands well. PREVENTING INFECTION  Wash your hands before and after handling your catheter.  Take showers daily and wash the area where the catheter enters your body. Do not take baths. Replace wet leg straps with dry ones, if this applies.  Do not use powders, sprays, or lotions on the genital area. Only use creams, lotions, or ointments as directed by your caregiver.  For females, wipe from front to back after each bowel movement.  Drink enough fluids to keep your urine clear or pale yellow unless you have a fluid restriction.  Do not let the drainage bag or tubing touch or lie on the floor.  Wear cotton underwear to absorb moisture and to keep your skin drier. SEEK MEDICAL CARE IF:   Your urine is cloudy or smells unusually bad.  Your catheter becomes clogged.  You are not draining urine into the bag or your bladder feels full.  Your catheter starts to leak. SEEK IMMEDIATE MEDICAL CARE IF:   You have pain, swelling, redness, or pus where the catheter enters the body.  You have pain in the abdomen, legs, lower back, or bladder.  You have a fever.  You see blood fill the catheter, or your urine is pink or red.  You have nausea, vomiting, or chills.  Your catheter gets pulled out. MAKE SURE YOU:   Understand these instructions.  Will watch your condition.  Will get help right away if you are not doing well or get worse.   This information is not intended to replace advice given to you by your health care provider. Make sure you discuss any questions you have with your health care provider.   Document Released: 09/20/2005 Document Revised: 02/04/2014 Document Reviewed: 09/11/2012 [employer] Interactive Patient Education 2016 [employer] Transurethral Resection, Bladder Tumor A cancerous growth (tumor) can develop on the inside wall of the bladder. The  bladder is the organ that holds urine. One way to remove the tumor is a procedure called a transurethral resection. The tumor is removed (resected) through the tube that carries urine from the bladder out of the body (urethra). No cuts (incisions) are made in the skin. Instead, the procedure is done through a thin telescope, called a resectoscope. Attached to it is a light and usually a tiny camera. The resectoscope is put into the urethra. In men, the urethra opens at the end of the penis. In women, it opens just above the vagina.  A transurethral resection is usually used to remove tumors that have not gotten too big or too deep. These are called Stage 0, Stage 1 or Stage  2 bladder cancers. LET YOUR CAREGIVER KNOW ABOUT:  On the day of the procedure, your caregivers will need to know the last time you had anything to eat or drink. This includes water, gum, and candy. In advance, make sure they know about:   Any allergies.  All medications you are taking, including:  Herbs, eyedrops, over-the-counter medications and creams.  Blood thinners (anticoagulants), aspirin or other drugs that could affect blood clotting.  Use of steroids (by mouth or as creams).  Previous problems with anesthetics, including local anesthetics.  Possibility of pregnancy, if this applies.  Any history of blood clots.  Any history of bleeding or other blood problems.  Previous surgery.  Smoking history.  Any recent symptoms of colds or infections.  Other health problems. RISKS AND COMPLICATIONS This is usually a safe procedure. Every procedure has risks, though. For a transurethral resection, they include: 7. Infection. Antibiotic medication would need to be taken. 8. Bleeding.  Light bleeding may last for several days after the procedure.  If bleeding continues or is heavy, the bladder may need rinsing. Or, a new catheter might be put in for awhile.  Sometimes bed rest is needed. 9. Urination  problems.  Pain and burning can occur when urinating. This usually goes away in a few days.  Scarring from the procedure can block the flow of urine. 10. Bladder damage.  It can be punctured or torn during removal of the tumor. If this happens, a catheter might be needed for longer. Antibiotics would be taken while the bladder heals.  Urine can leak through the hole or tear into the abdomen. If this happens, surgery may be needed to repair the bladder. BEFORE THE PROCEDURE  9. A medical evaluation will be done. This may include: 1. A physical examination. 2. Urine test. This is to make sure you do not have a urinary tract infection. 3. Blood tests. 4. A test that checks the heart's rhythm (electrocardiogram). 5. Talking with an anesthesiologist. This is the person who will be in charge of the medication (anesthesia) to keep you from feeling pain during the transurethral resection. You might be asleep during the procedure (general anesthesia) or numb from the waist down, but awake during the procedure (spinal anesthesia). Ask your surgeon what to expect. 10. The person who is having a transurethral resection needs to give what is called informed consent. This requires signing a legal paper that gives permission for the procedure. To give informed consent: 1. You must understand how the procedure is done and why. 2. You must be told all the risks and benefits of the procedure. 3. You must sign the consent. Sometimes a legal guardian can do this. 4. Signing should be witnessed by a healthcare professional. 11. The day before the surgery, eat only a light dinner. Then, do not eat or drink anything for at least 8 hours before the surgery. Ask your caregiver if it is OK to take any needed medicines with a sip of water. 12. Arrive at least an hour before the surgery or whenever your surgeon recommends. This will give you time to check in and fill out any needed paperwork. PROCEDURE 8. The  preparation: 1. You will change into a hospital gown. 2. A needle will be inserted in your arm. This is an intravenous access tube (IV). Medication will be able to flow directly into your body through this needle. 3. Small monitors will be put on your body. They are used to check your heart, blood  pressure, and oxygen level. 4. You might be given medication that will help you relax (sedative). 5. You will be given a general anesthetic or spinal anesthesia. 9. The procedure: 1. Once you are asleep or numb from the waist down, your legs will be placed in stirrups. 2. The resectoscope will be passed through the urethra into the bladder. 3. Fluid will be passed through the resectoscope. This will fill the bladder with water. 4. The surgeon will examine the bladder through the scope. If the scope has a camera, it can take pictures from inside the bladder. They can be projected onto a TV screen. 5. The surgeon will use various tools to remove the tumor in small pieces. Sometimes a laser (a beam of light energy) is used. Other tools may use electric current. 6. A tube (catheter) will often be placed so that urine can drain into a bag outside the body. This process helps stop bleeding. This tube keeps blood clots from blocking the urethra. 7. The procedure usually takes 30 to 45 minutes. AFTER THE PROCEDURE  9. You will stay in a recovery area until the anesthesia has worn off. Your blood pressure and pulse will be checked every so often. Then you will be taken to a hospital room. 10. You may continue to get fluids through the IV for awhile. 11. Some pain is normal. The catheter might be uncomfortable. Pain is usually not severe. If it is, ask for pain medicine. 12. Your urine may look bloody after a transurethral resection. This is normal. 1. If bleeding is heavy, a hospital caregiver may rinse out the bladder (irrigation) through the catheter. 2. Once the urine is clear, the catheter will be taken  out. 3. You will need to stay in the hospital until you can urinate on your own. 13. Most people stay in the hospital for up to 4 days. PROGNOSIS   Transurethral resection is considered the best way to treat bladder tumors that are not too far along. For most people, the treatment is successful. Sometimes, though, more treatment is needed.  Bladder cancers can come back even after a successful procedure. Because of this, be sure to have a checkup with your caregiver every 3 to 6 months. If everything is OK for 3 years, you can reduce the checkups to once a year.   This information is not intended to replace advice given to you by your health care provider. Make sure you discuss any questions you have with your health care provider.   Document Released: 07/17/2009 Document Revised: 12/13/2011 Document Reviewed: 09/22/2009 [employer] Interactive Patient Education [employer].

## 2016-02-12 ENCOUNTER — Encounter (HOSPITAL_COMMUNITY): Payer: Self-pay | Admitting: Urology

## 2016-02-13 ENCOUNTER — Ambulatory Visit (INDEPENDENT_AMBULATORY_CARE_PROVIDER_SITE_OTHER): Payer: Medicare Other | Admitting: Urology

## 2016-02-13 DIAGNOSIS — N3289 Other specified disorders of bladder: Secondary | ICD-10-CM | POA: Diagnosis not present

## 2016-02-13 DIAGNOSIS — R300 Dysuria: Secondary | ICD-10-CM

## 2016-02-14 ENCOUNTER — Emergency Department (HOSPITAL_COMMUNITY)
Admission: EM | Admit: 2016-02-14 | Discharge: 2016-02-14 | Disposition: A | Discharge status: Home / Self Care | Payer: Medicare Other | Attending: Emergency Medicine | Admitting: Emergency Medicine

## 2016-02-14 ENCOUNTER — Emergency Department (HOSPITAL_COMMUNITY): Payer: Medicare Other

## 2016-02-14 ENCOUNTER — Encounter (HOSPITAL_COMMUNITY): Payer: Self-pay | Admitting: *Deleted

## 2016-02-14 DIAGNOSIS — Z87891 Personal history of nicotine dependence: Secondary | ICD-10-CM | POA: Insufficient documentation

## 2016-02-14 DIAGNOSIS — M1990 Unspecified osteoarthritis, unspecified site: Secondary | ICD-10-CM | POA: Insufficient documentation

## 2016-02-14 DIAGNOSIS — I5022 Chronic systolic (congestive) heart failure: Secondary | ICD-10-CM | POA: Diagnosis not present

## 2016-02-14 DIAGNOSIS — T83511A Infection and inflammatory reaction due to indwelling urethral catheter, initial encounter: Secondary | ICD-10-CM

## 2016-02-14 DIAGNOSIS — R0602 Shortness of breath: Secondary | ICD-10-CM | POA: Diagnosis not present

## 2016-02-14 DIAGNOSIS — Y730 Diagnostic and monitoring gastroenterology and urology devices associated with adverse incidents: Secondary | ICD-10-CM | POA: Insufficient documentation

## 2016-02-14 DIAGNOSIS — Z79899 Other long term (current) drug therapy: Secondary | ICD-10-CM | POA: Diagnosis not present

## 2016-02-14 DIAGNOSIS — T83518A Infection and inflammatory reaction due to other urinary catheter, initial encounter: Secondary | ICD-10-CM | POA: Diagnosis not present

## 2016-02-14 DIAGNOSIS — N390 Urinary tract infection, site not specified: Secondary | ICD-10-CM

## 2016-02-14 DIAGNOSIS — R339 Retention of urine, unspecified: Secondary | ICD-10-CM | POA: Diagnosis not present

## 2016-02-14 DIAGNOSIS — Z791 Long term (current) use of non-steroidal anti-inflammatories (NSAID): Secondary | ICD-10-CM | POA: Insufficient documentation

## 2016-02-14 DIAGNOSIS — I110 Hypertensive heart disease with heart failure: Secondary | ICD-10-CM | POA: Diagnosis not present

## 2016-02-14 DIAGNOSIS — I4891 Unspecified atrial fibrillation: Secondary | ICD-10-CM | POA: Insufficient documentation

## 2016-02-14 LAB — COMPREHENSIVE METABOLIC PANEL
ALBUMIN: 3.5 g/dL (ref 3.5–5.0)
ALT: 18 U/L (ref 17–63)
AST: 26 U/L (ref 15–41)
Alkaline Phosphatase: 58 U/L (ref 38–126)
Anion gap: 5 (ref 5–15)
BILIRUBIN TOTAL: 2.5 mg/dL — AB (ref 0.3–1.2)
BUN: 38 mg/dL — ABNORMAL HIGH (ref 6–20)
CHLORIDE: 104 mmol/L (ref 101–111)
CO2: 27 mmol/L (ref 22–32)
Calcium: 8.4 mg/dL — ABNORMAL LOW (ref 8.9–10.3)
Creatinine, Ser: 1.3 mg/dL — ABNORMAL HIGH (ref 0.61–1.24)
GFR calc Af Amer: 55 mL/min — ABNORMAL LOW (ref >60)
GFR calc non Af Amer: 48 mL/min — ABNORMAL LOW (ref >60)
GLUCOSE: 107 mg/dL — AB (ref 65–99)
POTASSIUM: 3.4 mmol/L — AB (ref 3.5–5.1)
SODIUM: 136 mmol/L (ref 135–145)
TOTAL PROTEIN: 6.5 g/dL (ref 6.5–8.1)

## 2016-02-14 LAB — CBC WITH DIFFERENTIAL/PLATELET
BASOS ABS: 0 10*3/uL (ref 0.0–0.1)
BASOS PCT: 0 %
EOS ABS: 0 10*3/uL (ref 0.0–0.7)
EOS PCT: 0 %
HEMATOCRIT: 31.3 % — AB (ref 39.0–52.0)
Hemoglobin: 10.9 g/dL — ABNORMAL LOW (ref 13.0–17.0)
LYMPHS PCT: 7 %
Lymphs Abs: 0.3 10*3/uL — ABNORMAL LOW (ref 0.7–4.0)
MCH: 33.4 pg (ref 26.0–34.0)
MCHC: 34.8 g/dL (ref 30.0–36.0)
MCV: 96 fL (ref 78.0–100.0)
MONO ABS: 0.5 10*3/uL (ref 0.1–1.0)
MONOS PCT: 10 %
NEUTROS ABS: 4.2 10*3/uL (ref 1.7–7.7)
NEUTROS PCT: 83 %
PLATELETS: 82 10*3/uL — AB (ref 150–400)
RBC: 3.26 MIL/uL — ABNORMAL LOW (ref 4.22–5.81)
RDW: 14.5 % (ref 11.5–15.5)
WBC: 5.1 10*3/uL (ref 4.0–10.5)

## 2016-02-14 LAB — URINALYSIS, ROUTINE W REFLEX MICROSCOPIC
GLUCOSE, UA: NEGATIVE mg/dL
Ketones, ur: NEGATIVE mg/dL
Nitrite: NEGATIVE
PROTEIN: 100 mg/dL — AB
Specific Gravity, Urine: 1.01 (ref 1.005–1.030)
pH: 7 (ref 5.0–8.0)

## 2016-02-14 LAB — URINE MICROSCOPIC-ADD ON

## 2016-02-14 MED ORDER — LEVOFLOXACIN 500 MG PO TABS: 500.0000 mg | ORAL_TABLET | Freq: Every day | ORAL | Status: DC

## 2016-02-14 NOTE — ED Provider Notes (Signed)
CSN: [id number]     Arrival date & time 02/14/16  1431 History   First MD Initiated Contact with Patient 02/14/16 1502     No chief complaint on file.    (Consider location/radiation/quality/duration/timing/severity/associated sxs/prior Treatment) HPI Comments: [name] is a 82 y.o. male with history of HTN, systolic CHF, dementia (per wife), biventricular pacemaker, and atrial fibrillation reports to ED for urinary retention. 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 Catheter was removed Friday morning and patient reports urine production on Friday. However, today, patient unable to pass urine. Endorses urgency and suprapubic pain. Denies fever, chills, or night sweats. Per wife, patient is mildly altered from baseline.    The history is provided by the patient, the spouse and medical records.    Past Medical History  Diagnosis Date  . Chronic systolic heart failure ([place])   . Rash and other nonspecific skin eruption   . Costochondritis, acute   . Glaucoma   . Erectile dysfunction   . Visual changes   . Pancytopenia   . Chronic pancreatitis ([place])     Based on CT findings  . History of melanoma in situ     JAN 2012--  LEFT HEEL W/ SLN BX  . Chronic back pain   . IC (interstitial cystitis)   . BPH (benign prostatic hypertrophy)   . Cardiac pacemaker     CARDIOLOGIST-- DR Cristopher Peru (LAST PACE [facility] 05-15-2013)  . Asymptomatic carotid artery stenosis     BILATERAL MILD ICA---  <50% PER DUPLEX 03-08-2008  . History of syncope   . Hypertension   . CHB (complete heart block) ([place])   . Nonischemic cardiomyopathy ([place])   . Severe mitral regurgitation   . Urge urinary incontinence   . A-fib ([place])   . Dementia   . Alzheimer disease   . Arthritis   . Cancer ([place])   . Poor historian   . HOH (hard of hearing)    Past Surgical History  Procedure Laterality Date  . Cataract extraction, bilateral  left  1997/    right 2003  . Pacemaker insertion  12-06-2008  DR GREGG TAYLOR    BiV PPM  --  [employer]  . Colonoscopy  08/18/2006    [id number] granularity and erosions of the rectum of uncertain significance biopsied.  A long redundant, but otherwise normal-appearing colon.melanosi coli and minimal inflammation on bx  . Inguinal hernia repair Bilateral AS TEEN  . Interstim implant placement  2001  &  2007  . Cysto/ hod/  replacement interstim implant  05-14-2003  . Removal interstim implant/  cyst/  hod  04-14-2004  . Revision interstim implant and replace generator  05-15-2009    left upper buttock for urge urinary incontinence  . Wide excision left heel and sln bx  JUNE 2012  . Tonsillectomy  1950  . Left elbow surgery  2002  . Back surgery  X3  LAST ONE'90's  . Transthoracic echocardiogram  12-06-2008    LVSF 55-60%/  MODERATE MR/  MILD AR/  QUESTION DISTAL POSTERIOR HYPOKINESIS  . Interstim implant revision N/A 08/28/2013    Procedure: REPLACEMENT OF INTERSTIM-GENERATOR AND LEAD ;  Surgeon: [name], MD;  Location: [place];  Service: Urology;  Laterality: N/A;  . Esophagogastroduodenoscopy N/A 03/28/2014    Dr. [name]: normal esophagus s/p dilation, mosaic appearance - chronic inflammation noted on bx  . Savory dilation N/A 03/28/2014    Procedure: SAVORY DILATION;  Surgeon: [name], MD;  [facility];  Service: Endoscopy;  Laterality: N/A;  Venia Minks dilation N/A 03/28/2014    Procedure: Venia Minks DILATION;  Surgeon: [name], MD;  [facility];  Service: Endoscopy;  Laterality: N/A;  . Colonoscopy N/A 08/05/2014    Procedure: COLONOSCOPY;  Surgeon: [name], MD;  [facility];  Service: Endoscopy;  Laterality: N/A;  PT NEEDS TCS AT 1000.  . Implantable cardioverter defibrillator (icd) generator change N/A 10/11/2014    Procedure: ICD GENERATOR CHANGE;  Surgeon: [name], MD;  Location: [facility] CATH LAB;  Service:  Cardiovascular;  Laterality: N/A;  . Transurethral resection of bladder tumor with gyrus (turbt-gyrus) N/A 02/11/2016    Procedure: TRANSURETHRAL RESECTION OF BLADDER TUMOR WITH GYRUS (TURBT-GYRUS);  Surgeon: Cleon Gustin, MD;  Location: [facility];  Service: Urology;  Laterality: N/A;  . Cystoscopy w/ retrogrades N/A 02/11/2016    Procedure: CYSTOSCOPY WITH RETROGRADE PYELOGRAM;  Surgeon: Cleon Gustin, MD;  Location: [facility];  Service: Urology;  Laterality: N/A;   Family History  Problem Relation Age of Onset  . Dementia Mother   . Throat cancer Father   . Cancer Father 86    throat  . Lung cancer Sister   . Heart disease Brother    Social History  Substance Use Topics  . Smoking status: Former Smoker -- 0.02 packs/day for 30 years    Types: Cigarettes    Quit date: 08/24/1997  . Smokeless tobacco: Never Used  . Alcohol Use: No    Review of Systems  Constitutional: Negative for fever, chills and diaphoresis.  HENT: Negative for trouble swallowing.   Eyes: Negative for visual disturbance.       History of glaucoma in left eye.   Respiratory: Positive for shortness of breath ( intermittent).   Cardiovascular: Negative for chest pain.  Gastrointestinal: Positive for abdominal pain ( Suprapubic). Negative for nausea, vomiting, diarrhea, constipation and blood in stool.  Genitourinary: Positive for urgency and difficulty urinating. Negative for hematuria.  Musculoskeletal: Positive for back pain ( chronic).  Skin: Negative for rash.  Neurological: Negative for headaches.      Allergies  Codeine and Penicillins  Home Medications   Prior to Admission medications   Medication Sig Start Date End Date Taking? Authorizing Provider  amLODipine (NORVASC) 2.5 MG tablet TAKE 1 TABLET (2.5 MG TOTAL) BY MOUTH DAILY. 02/02/16  Yes Fayrene Helper, MD  diclofenac sodium (VOLTAREN) 1 % GEL Apply 2 g topically daily as needed (pain). Reported on 01/05/2016 12/04/15  Yes Historical  Provider, MD  fentaNYL (DURAGESIC - DOSED MCG/HR) 25 MCG/HR patch Place 1 patch (25 mcg total) onto the skin every 3 (three) days. 02/05/16  Yes Fayrene Helper, MD  furosemide (LASIX) 20 MG tablet TAKE AS DIRECTED Patient taking differently: Take 20 mg by mouth daily. May take twice if needed. 02/05/16  Yes [name], MD  ibuprofen (ADVIL,MOTRIN) 200 MG tablet Take 400 mg by mouth every 6 (six) hours as needed for moderate pain.   Yes Historical Provider, MD  Meth-Hyo-M Bl-Na Phos-Ph Sal (URIBEL) 118 MG CAPS Take 1 capsule (118 mg total) by mouth 2 (two) times daily as needed (bladder). 02/11/16  Yes Cleon Gustin, MD  polyethylene glycol powder (GLYCOLAX/MIRALAX) powder Take 17 g by mouth daily. Patient taking differently: Take 17 g by mouth daily as needed for mild constipation.  05/16/15  Yes Fayrene Helper, MD  traMADol (  ULTRAM) 50 MG tablet Take 1 tablet (50 mg total) by mouth every 4 (four) hours as needed for moderate pain. 02/11/16  Yes Cleon Gustin, MD  levofloxacin (LEVAQUIN) 500 MG tablet Take 1 tablet (500 mg total) by mouth daily. 02/14/16   Roxanna Mew, PA-C   BP 115/84 mmHg  Pulse 64  Temp(Src) 98.2 F (36.8 C) (Oral)  Resp 18  Ht 5\' 11"  (1.803 m)  Wt 70.308 kg  BMI 21.63 kg/m2  SpO2 97% Physical Exam  Constitutional: He appears well-developed and well-nourished. He is cooperative. No distress.  HENT:  Head: Normocephalic and atraumatic.  Mouth/Throat: Uvula is midline and oropharynx is clear and moist. Mucous membranes are dry ( mild). No trismus in the jaw. No uvula swelling. No oropharyngeal exudate.  Eyes: Pupils are equal, round, and reactive to light. No scleral icterus.  Neck: Normal range of motion and phonation normal. No JVD present.  Cardiovascular: Normal rate, regular rhythm and intact distal pulses.   Murmur ( Systolic murmur heard best in apex with radiation to axilla. ) heard. Pulmonary/Chest: Effort normal. No respiratory distress.   Patient intermittently hypoxic during exam generally when resting, but does not appear to be in distress. When taking full breaths and awake, hypoxia resolves. Mildly decreased breath sounds in right lower lung base. No wheezes.   Abdominal: Bowel sounds are normal. He exhibits distension. He exhibits no pulsatile midline mass. There is tenderness in the right lower quadrant and suprapubic area. There is no CVA tenderness.  Lower abdominal distended and firm to umbilicus, per US bladder distended to level of umbilicus. TTP of suprapubic and RLQ. Positive bowel sounds in all four quadrants.   Musculoskeletal: Normal range of motion. He exhibits no edema or tenderness.  Neurological: He is alert.  Patient strength grossly intact in upper and lower extremities.   Skin: Skin is warm and dry. He is not diaphoretic.  Psychiatric: He has a normal mood and affect.    ED Course  Procedures (including critical care time) Labs Review Labs Reviewed  CBC WITH DIFFERENTIAL/PLATELET - Abnormal; Notable for the following:    RBC 3.26 (*)    Hemoglobin 10.9 (*)    HCT 31.3 (*)    Platelets 82 (*)    Lymphs Abs 0.3 (*)    All other components within normal limits  COMPREHENSIVE METABOLIC PANEL - Abnormal; Notable for the following:    Potassium 3.4 (*)    Glucose, Bld 107 (*)    BUN 38 (*)    Creatinine, Ser 1.30 (*)    Calcium 8.4 (*)    Total Bilirubin 2.5 (*)    GFR calc non Af Amer 48 (*)    GFR calc Af Amer 55 (*)    All other components within normal limits  URINALYSIS, ROUTINE W REFLEX MICROSCOPIC (NOT AT [facility]) - Abnormal; Notable for the following:    Color, Urine GREEN (*)    APPearance TURBID (*)    Hgb urine dipstick LARGE (*)    Bilirubin Urine SMALL (*)    Protein, ur 100 (*)    Leukocytes, UA SMALL (*)    All other components within normal limits  URINE MICROSCOPIC-ADD ON - Abnormal; Notable for the following:    Squamous Epithelial / LPF 0-5 (*)    Bacteria, UA MANY (*)     Casts GRANULAR CAST (*)    All other components within normal limits    Imaging Review Dg Chest 2 View  02/14/2016  CLINICAL DATA:  Shortness of breath, recent bladder tumor removal EXAM: CHEST  2 VIEW COMPARISON:  12/15/2015 FINDINGS: Cardiac shadow remains enlarged. A defibrillator is again seen and stable. No acute infiltrate is noted. Mild vascular congestion is seen without significant edema. No acute bony abnormality is noted. IMPRESSION: Stable vascular congestion. No significant edema is noted at this time. Electronically Signed   By: Inez Catalina M.D.   On: 02/14/2016 17:15   I have personally reviewed and evaluated these images and lab results as part of my medical decision-making. Enlarged cardiac silhouette, stable compared to previous. Pulmonary vascular congestion, stable compared to previous. No effusions. No pneumothorax. Review in line with radiology report.      EKG Interpretation None     EMERGENCY DEPARTMENT US RENAL EXAM  "Study: Limited Retroperitoneal Ultrasound of Kidneys"  INDICATIONS: urinary retention  Long and short axis of both kidneys were obtained.   PERFORMED BY: Myself and Dr. Sabra Heck   IMAGES ARCHIVED?: Yes  LIMITATIONS: None  VIEWS USED: Short axis   INTERPRETATION: Right Hydronephrosis moderate, Left  Hydronephrosis moderate   CPT Code: [phone] (limited retroperitoneal)  MDM   Final diagnoses:  Urinary retention  Urinary tract infection associated with catheterization of urinary tract, initial encounter    Dejean Dostal is a 82 y.o. 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 Patient is afebrile and non-toxic appearing. Abdomen is distended and firm to level of umbilicus with TTP of suprapubic and RLQ, positive bowel sounds. US shows bladder distension to level of umbilicus and b/l  hydronephrosis. Foley catheter placed and strict in/out applied. 1241ml urine output  U/A reveals green urine most likely secondary to urobel use; hemoglobin and bilirubin present most likely secondary to recent transurethral surgery; bacteria and leukocytes present - will treat with ABX and send urine for culture. CBC shows mildly low hgb/hct, patient chronically low compared to previous laboratory findings. CMP shows mild elevation of creatinine and BUN, most likely secondary to urinary retention. On re-examination, abdominal distension is improved. Suspect urinary retention secondary to recent surgery vs. Infection.   During exam patient had intermittent hypoxia with O2 sats in mid - 80s, specifically while patient was resting. Hypoxia corrected with full breaths and arousal. Mildly decreased breath sounds in right lower lung base. Chest x-ray shows no acute cardiopulmonary process. Patient ambulated and maintained O2 saturation levels in 98-99%. Suspect intermittent hypoxia related to hypopnea/hypoventilation while resting. However, will choose ABX that will cover both respiratory and urinary infections.   Prescribed Levaquin. D/c'd with foley catheter. Encouraged patient to call urology to notify of re-placement of catheter. Follow up with urology on Monday. Discussed importance of ambulating regularly. Provided return precautions. Patient and spouse voiced understanding and are agreeable.     Macario Golds [place], [place] 02/15/16 [id number]  Noemi Chapel, MD 02/18/16 [phone]

## 2016-02-14 NOTE — ED Provider Notes (Signed)
The patient is an 82 year old male, he has a known history of dementia according to his wife, he also has a history of chronic systolic congestive heart failure, nonischemic cardiomyopathy, hypertension and arthritis. The patient does have a biventricular pacemaker. He has been on amiodarone, Xarelto and Lasix in the past.. He has not been on the blood center or the Lasix recently because of a GI bleed.  Recently the patient was found to have a prostatic urethral tumor on cystoscopy, he was taken for a transurethral resection of a bladder tumor on May 10, he was released from the hospital passing urine however over the last 24 hours there is a question as to whether he has had decreased urine output and today there has not been any urinary output. His significant other states that he has had a decreased mental status today though he seems to be pretty close to normal. He has been able to ambulate. He has been able to eat and drink. He complains of increasing lower abdominal pain.  Level V caveat applies secondary to altered mental status and dementia  On physical exam the patient has a systolic ejection murmur, no peripheral edema or JVD, lungs sounds are slightly decreased bilaterally but he does not appear to be in distress, abdomen is soft but he has a fullness and tenderness from the umbilicus through the pelvis. Bedside ultrasound confirms that the patient doesn't fact have significant urinary retention which is also causing hydronephrosis bilaterally. These images were captured N save. The patient will need a Foley catheter placed, urinalysis, check renal function. We'll also obtain a chest x-ray due to decreased lung sounds and intermittent hypoxia during the exam.  Medical screening examination/treatment/procedure(s) were conducted as a shared visit with non-physician practitioner(s) and myself.  I personally evaluated the patient during the encounter.  Clinical Impression:   Final diagnoses:   Urinary retention  Urinary tract infection associated with catheterization of urinary tract, initial encounter         Noemi Chapel, MD 02/18/16 [phone]

## 2016-02-14 NOTE — Discharge Instructions (Signed)
Read the information below.  Use the prescribed medication as directed.  Please discuss all new medications with your pharmacist.  Take the antibiotics as prescribed. Take full course even if feeling better. Be sure to call your urologist and let them know a catheter had to be put back in place. See your urologist on Monday for further evaluation. Be sure to get up and walk as tolerated every hour to reduce the risk of blood clots. Return to the emergency department if your symptoms worsen or you develop: fevers, chills, nightsweats, chest pain, shortness of breath, lower leg swelling/pain, changes in mental status, or new neurologic symptoms such as numbness, weakness, or loss of consciousness.    Acute Urinary Retention, Male Acute urinary retention is when you are unable to pee (urinate). Acute urinary retention is common in older men. Prostates can get bigger, which blocks the flow of pee.  HOME CARE  Drink enough fluids to keep your pee clear or pale yellow.  If you are sent home with a tube that drains the bladder (catheter), there will be a drainage bag attached to it. There are two types of bags. One is big that you can wear at night without having to empty it. One is smaller and needs to be emptied more often.  Keep the drainage bag empty.  Keep the drainage bag lower than your catheter.  Only take medicine as told by your doctor. GET HELP IF:  You have a low-grade fever.  You have spasms or you are leaking pee when you have spasms. GET HELP RIGHT AWAY IF:   You have chills or a fever.  Your catheter stops draining pee.  Your catheter falls out.  You have increased bleeding that does not stop after you have rested and increased the amount of fluids you had been drinking. MAKE SURE YOU:   Understand these instructions.  Will watch your condition.  Will get help right away if you are not doing well or get worse.   This information is not intended to replace advice given  to you by your health care provider. Make sure you discuss any questions you have with your health care provider.   Document Released: 03/08/2008 Document Revised: 02/04/2015 Document Reviewed: 03/01/2013 Elsevier Interactive Patient Education 2016 [place].  Antibiotic Medicine Antibiotic medicines are used to treat infections caused by bacteria. They work by hurting or killing the germs that are making you sick. HOW WILL MY MEDICINE BE PICKED? There are many kinds of antibiotic medicines. To help your doctor pick one, tell your doctor if:  You have any allergies.  You are pregnant or plan to get pregnant.  You are breastfeeding.  You are taking any medicines. These include over-the-counter medicines, prescription medicines, and herbal remedies.  You have a medical condition or problem. If you have questions about why your medicine was picked, ask. FOR HOW LONG SHOULD I TAKE MY MEDICINE? Take your medicine for as long as your doctor tells you to. Do not stop taking it when you feel better. If you stop taking it too soon:  You may start to feel sick again.  Your infection may get harder to treat.  New problems may develop. WHAT IF I MISS A DOSE? Try not to miss any doses of antibiotic medicine. If you miss a dose:  Take the dose as soon as you can.  If you are taking 2 doses a day, take the next dose in 5 to 6 hours.  If you  are taking 3 or more doses a day, take the next dose in 2 to 4 hours. Then go back to the normal schedule. If you cannot take a missed dose, take the next dose on time. Then take the missed dose after you have taken all the doses as told by your doctor, as if you had one more dose left. DOES THIS MEDICINE AFFECT BIRTH CONTROL? Birth control pills may not work while you are on antibiotic medicines. If you are taking birth control pills, keep taking them as usual. Use a second form of birth control, such as a condom. Keep using the second form of birth  control until you are finished with your current 1 month cycle of birth control pills. GET HELP IF:  You get worse.  You do not feel better a few days after starting the medicine.  You throw up (vomit).  There are white patches in your mouth.  You have new joint pain after starting the medicine.  You have new muscle aches after starting the medicine.  You had a fever before starting the medicine, and it comes back.  You have any symptoms of an allergic reaction, such as an itchy rash. If this happens, stop taking the medicine. GET HELP RIGHT AWAY IF:  Your pee (urine) turns dark or becomes blood-colored.  Your skin turns yellow.  You bruise or bleed easily.  You have very bad watery poop (diarrhea) and cramps in your belly (abdomen).  You have a very bad headache.  You have signs of a very bad allergic reaction, such as:  Trouble breathing.  Wheezing.  Swelling of the lips, tongue, or face.  Fainting.  Blisters on the skin or in the mouth. If you have signs of a very bad allergic reaction, stop taking the antibiotic medicine right away.   This information is not intended to replace advice given to you by your health care provider. Make sure you discuss any questions you have with your health care provider.   Document Released: 06/29/2008 Document Revised: 06/11/2015 Document Reviewed: 02/05/2015 Elsevier Interactive Patient Education [employer].

## 2016-02-14 NOTE — ED Notes (Addendum)
Pt had tumor removed from his bladder on Wednesday at [facility] and foley removed Friday by MD. Pt was passing urine normally on Friday after foley removal. Pt woke up this morning and was unable to pass urine this morning, other than about "2-3 spoonfuls" per pt. Pt unable to pass normal urine amount since last night before bed. Pt also c/o mid abdominal pain since Thursday, pt denies nausea and vomiting.

## 2016-02-14 NOTE — ED Notes (Signed)
Pt tolerated ambulation well with assistant with walker o2 stat maintained around 98-99.

## 2016-02-18 ENCOUNTER — Ambulatory Visit (INDEPENDENT_AMBULATORY_CARE_PROVIDER_SITE_OTHER): Payer: Medicare Other | Admitting: Urology

## 2016-02-18 DIAGNOSIS — N390 Urinary tract infection, site not specified: Secondary | ICD-10-CM

## 2016-02-18 DIAGNOSIS — N411 Chronic prostatitis: Secondary | ICD-10-CM | POA: Diagnosis not present

## 2016-02-19 ENCOUNTER — Emergency Department (HOSPITAL_COMMUNITY): Payer: Medicare Other

## 2016-02-19 ENCOUNTER — Emergency Department (HOSPITAL_COMMUNITY)
Admission: EM | Admit: 2016-02-19 | Discharge: 2016-02-19 | Disposition: A | Discharge status: Home / Self Care | Payer: Medicare Other | Attending: Emergency Medicine | Admitting: Emergency Medicine

## 2016-02-19 ENCOUNTER — Encounter (HOSPITAL_COMMUNITY): Payer: Self-pay

## 2016-02-19 DIAGNOSIS — I5022 Chronic systolic (congestive) heart failure: Secondary | ICD-10-CM | POA: Insufficient documentation

## 2016-02-19 DIAGNOSIS — T50905A Adverse effect of unspecified drugs, medicaments and biological substances, initial encounter: Secondary | ICD-10-CM

## 2016-02-19 DIAGNOSIS — T50995A Adverse effect of other drugs, medicaments and biological substances, initial encounter: Secondary | ICD-10-CM | POA: Diagnosis not present

## 2016-02-19 DIAGNOSIS — Z79899 Other long term (current) drug therapy: Secondary | ICD-10-CM | POA: Insufficient documentation

## 2016-02-19 DIAGNOSIS — I110 Hypertensive heart disease with heart failure: Secondary | ICD-10-CM | POA: Diagnosis not present

## 2016-02-19 DIAGNOSIS — R441 Visual hallucinations: Secondary | ICD-10-CM | POA: Diagnosis not present

## 2016-02-19 DIAGNOSIS — N390 Urinary tract infection, site not specified: Secondary | ICD-10-CM | POA: Diagnosis not present

## 2016-02-19 DIAGNOSIS — M1990 Unspecified osteoarthritis, unspecified site: Secondary | ICD-10-CM | POA: Diagnosis not present

## 2016-02-19 DIAGNOSIS — F29 Unspecified psychosis not due to a substance or known physiological condition: Secondary | ICD-10-CM | POA: Diagnosis not present

## 2016-02-19 DIAGNOSIS — R4182 Altered mental status, unspecified: Secondary | ICD-10-CM | POA: Diagnosis not present

## 2016-02-19 DIAGNOSIS — I4891 Unspecified atrial fibrillation: Secondary | ICD-10-CM | POA: Insufficient documentation

## 2016-02-19 DIAGNOSIS — G309 Alzheimer's disease, unspecified: Secondary | ICD-10-CM | POA: Insufficient documentation

## 2016-02-19 DIAGNOSIS — R443 Hallucinations, unspecified: Secondary | ICD-10-CM | POA: Diagnosis not present

## 2016-02-19 DIAGNOSIS — Z87891 Personal history of nicotine dependence: Secondary | ICD-10-CM | POA: Diagnosis not present

## 2016-02-19 LAB — CBC WITH DIFFERENTIAL/PLATELET
BASOS ABS: 0 10*3/uL (ref 0.0–0.1)
BASOS PCT: 0 %
EOS ABS: 0 10*3/uL (ref 0.0–0.7)
Eosinophils Relative: 1 %
HEMATOCRIT: 31.6 % — AB (ref 39.0–52.0)
HEMOGLOBIN: 10.8 g/dL — AB (ref 13.0–17.0)
Lymphocytes Relative: 11 %
Lymphs Abs: 0.3 10*3/uL — ABNORMAL LOW (ref 0.7–4.0)
MCH: 33.1 pg (ref 26.0–34.0)
MCHC: 34.2 g/dL (ref 30.0–36.0)
MCV: 96.9 fL (ref 78.0–100.0)
Monocytes Absolute: 0.3 10*3/uL (ref 0.1–1.0)
Monocytes Relative: 12 %
NEUTROS ABS: 2.2 10*3/uL (ref 1.7–7.7)
NEUTROS PCT: 76 %
Platelets: 96 10*3/uL — ABNORMAL LOW (ref 150–400)
RBC: 3.26 MIL/uL — ABNORMAL LOW (ref 4.22–5.81)
RDW: 13.7 % (ref 11.5–15.5)
WBC: 2.8 10*3/uL — AB (ref 4.0–10.5)

## 2016-02-19 LAB — BASIC METABOLIC PANEL
ANION GAP: 7 (ref 5–15)
BUN: 22 mg/dL — ABNORMAL HIGH (ref 6–20)
CALCIUM: 8.7 mg/dL — AB (ref 8.9–10.3)
CHLORIDE: 106 mmol/L (ref 101–111)
CO2: 27 mmol/L (ref 22–32)
CREATININE: 1.07 mg/dL (ref 0.61–1.24)
GFR calc non Af Amer: >60 mL/min (ref >60)
Glucose, Bld: 100 mg/dL — ABNORMAL HIGH (ref 65–99)
Potassium: 3.5 mmol/L (ref 3.5–5.1)
SODIUM: 140 mmol/L (ref 135–145)

## 2016-02-19 LAB — URINE MICROSCOPIC-ADD ON: SQUAMOUS EPITHELIAL / LPF: NONE SEEN

## 2016-02-19 LAB — URINALYSIS, ROUTINE W REFLEX MICROSCOPIC
Bilirubin Urine: NEGATIVE
Glucose, UA: NEGATIVE mg/dL
KETONES UR: NEGATIVE mg/dL
NITRITE: NEGATIVE
PH: 8.5 — AB (ref 5.0–8.0)
Protein, ur: 100 mg/dL — AB
Specific Gravity, Urine: 1.015 (ref 1.005–1.030)

## 2016-02-19 NOTE — ED Notes (Signed)
Plan to d/c home with follow up PCP for sleep study.   [place] [employer] to come see family to see about any home assistance.

## 2016-02-19 NOTE — ED Notes (Signed)
Weaning oxygen down.  Pt desatted to 82% on 2 liters [place].

## 2016-02-19 NOTE — ED Notes (Signed)
Pt sitting up in wheelchair.  Wife coming to get pt.

## 2016-02-19 NOTE — ED Provider Notes (Signed)
CSN: [id number]     Arrival date & time 02/19/16  0548 History   First MD Initiated Contact with Patient 02/19/16 [phone]     Chief Complaint  Patient presents with  . Altered Mental Status     (Consider location/radiation/quality/duration/timing/severity/associated sxs/prior Treatment) HPI  This is an 82 year old male with a history of bladder cancer, systolic heart failure, cardiomyopathy, Alzheimer's disease who presents with altered mental status. Per the patient's wife, he has not slept in 2 days. He has been hallucinating and seeing things that are not there. She reports that he has become increasingly agitated. He has dementia at baseline but is normally directable. He had a recent transurethral resection of bladder tumor and subsequent urinary retention. He had a Foley catheter placed on May 13. He's not had any fevers. Patient does not complain of any pain. He is oriented 2 (disoriented to time).  Patient denies chest pain, shortness breath, abdominal pain.  On Levaquin for UTI.  Past Medical History  Diagnosis Date  . Chronic systolic heart failure ([place])   . Rash and other nonspecific skin eruption   . Costochondritis, acute   . Glaucoma   . Erectile dysfunction   . Visual changes   . Pancytopenia   . Chronic pancreatitis ([place])     Based on CT findings  . History of melanoma in situ     JAN 2012--  LEFT HEEL W/ SLN BX  . Chronic back pain   . IC (interstitial cystitis)   . BPH (benign prostatic hypertrophy)   . Cardiac pacemaker     CARDIOLOGIST-- DR Cristopher Peru (LAST PACE [facility] 05-15-2013)  . Asymptomatic carotid artery stenosis     BILATERAL MILD ICA---  <50% PER DUPLEX 03-08-2008  . History of syncope   . Hypertension   . CHB (complete heart block) ([place])   . Nonischemic cardiomyopathy ([place])   . Severe mitral regurgitation   . Urge urinary incontinence   . A-fib ([place])   . Dementia   . Alzheimer disease   . Arthritis   . Cancer ([place])   . Poor historian   . HOH  (hard of hearing)    Past Surgical History  Procedure Laterality Date  . Cataract extraction, bilateral  left  1997/   right 2003  . Pacemaker insertion  12-06-2008  DR GREGG TAYLOR    BiV PPM  --  [employer]  . Colonoscopy  08/18/2006    [id number] granularity and erosions of the rectum of uncertain significance biopsied.  A long redundant, but otherwise normal-appearing colon.melanosi coli and minimal inflammation on bx  . Inguinal hernia repair Bilateral AS TEEN  . Interstim implant placement  2001  &  2007  . Cysto/ hod/  replacement interstim implant  05-14-2003  . Removal interstim implant/  cyst/  hod  04-14-2004  . Revision interstim implant and replace generator  05-15-2009    left upper buttock for urge urinary incontinence  . Wide excision left heel and sln bx  JUNE 2012  . Tonsillectomy  1950  . Left elbow surgery  2002  . Back surgery  X3  LAST ONE'90's  . Transthoracic echocardiogram  12-06-2008    LVSF 55-60%/  MODERATE MR/  MILD AR/  QUESTION DISTAL POSTERIOR HYPOKINESIS  . Interstim implant revision N/A 08/28/2013    Procedure: REPLACEMENT OF INTERSTIM-GENERATOR AND LEAD ;  Surgeon: [name], MD;  Location: [facility];  Service: Urology;  Laterality: N/A;  . Esophagogastroduodenoscopy N/A 03/28/2014  Dr. [name]: normal esophagus s/p dilation, mosaic appearance - chronic inflammation noted on bx  . Savory dilation N/A 03/28/2014    Procedure: SAVORY DILATION;  Surgeon: [name], MD;  [facility];  Service: Endoscopy;  Laterality: N/A;  Venia Minks dilation N/A 03/28/2014    Procedure: Venia Minks DILATION;  Surgeon: [name], MD;  [facility];  Service: Endoscopy;  Laterality: N/A;  . Colonoscopy N/A 08/05/2014    Procedure: COLONOSCOPY;  Surgeon: [name], MD;  [facility];  Service: Endoscopy;  Laterality: N/A;  PT NEEDS TCS AT 1000.  . Implantable cardioverter defibrillator (icd) generator change N/A  10/11/2014    Procedure: ICD GENERATOR CHANGE;  Surgeon: [name], MD;  Location: [facility] CATH LAB;  Service: Cardiovascular;  Laterality: N/A;  . Transurethral resection of bladder tumor with gyrus (turbt-gyrus) N/A 02/11/2016    Procedure: TRANSURETHRAL RESECTION OF BLADDER TUMOR WITH GYRUS (TURBT-GYRUS);  Surgeon: Cleon Gustin, MD;  Location: [facility] OR[facility];  Service: Urology;  Laterality: N/A;  . Cystoscopy w/ retrogrades N/A 02/11/2016    Procedure: CYSTOSCOPY WITH RETROGRADE PYELOGRAM;  Surgeon: Cleon Gustin, MD;  Location: [facility];  Service: Urology;  Laterality: N/A;   Family History  Problem Relation Age of Onset  . Dementia Mother   . Throat cancer Father   . Cancer Father 86    throat  . Lung cancer Sister   . Heart disease Brother    Social History  Substance Use Topics  . Smoking status: Former Smoker -- 0.02 packs/day for 30 years    Types: Cigarettes    Quit date: 08/24/1997  . Smokeless tobacco: Never Used  . Alcohol Use: No    Review of Systems  Constitutional: Negative for fever.  Respiratory: Negative for shortness of breath.   Cardiovascular: Negative for chest pain.  Gastrointestinal: Negative for nausea, vomiting and abdominal pain.  Skin: Negative for rash.  Psychiatric/Behavioral: Positive for hallucinations, behavioral problems and confusion.  All other systems reviewed and are negative.     Allergies  Codeine and Penicillins  Home Medications   Prior to Admission medications   Medication Sig Start Date End Date Taking? Authorizing Provider  amLODipine (NORVASC) 2.5 MG tablet TAKE 1 TABLET (2.5 MG TOTAL) BY MOUTH DAILY. 02/02/16   Fayrene Helper, MD  diclofenac sodium (VOLTAREN) 1 % GEL Apply 2 g topically daily as needed (pain). Reported on 01/05/2016 12/04/15   Historical Provider, MD  fentaNYL (DURAGESIC - DOSED MCG/HR) 25 MCG/HR patch Place 1 patch (25 mcg total) onto the skin every 3 (three) days. 02/05/16   Fayrene Helper, MD  furosemide  (LASIX) 20 MG tablet TAKE AS DIRECTED Patient taking differently: Take 20 mg by mouth daily. May take twice if needed. 02/05/16   [name], MD  ibuprofen (ADVIL,MOTRIN) 200 MG tablet Take 400 mg by mouth every 6 (six) hours as needed for moderate pain.    Historical Provider, MD  levofloxacin (LEVAQUIN) 500 MG tablet Take 1 tablet (500 mg total) by mouth daily. 02/14/16   Roxanna Mew, PA-C  Meth-Hyo-M Bl-Na Phos-Ph Sal (URIBEL) 118 MG CAPS Take 1 capsule (118 mg total) by mouth 2 (two) times daily as needed (bladder). 02/11/16   Cleon Gustin, MD  polyethylene glycol powder (GLYCOLAX/MIRALAX) powder Take 17 g by mouth daily. Patient taking differently: Take 17 g by mouth daily as needed for mild constipation.  05/16/15   Fayrene Helper, MD  traMADol (ULTRAM) 50 MG tablet Take 1 tablet (50 mg total) by mouth every 4 (four) hours as needed for moderate pain. 02/11/16   Cleon Gustin, MD   BP 140/105 mmHg  Pulse 94  Temp(Src) 98.2 F (36.8 C) (Rectal)  Resp 20  Ht 5\' 11"  (1.803 m)  Wt 155 lb (70.308 kg)  BMI 21.63 kg/m2  SpO2 96% Physical Exam  Constitutional: No distress.  Elderly, nontoxic appearing, calm and cooperative  HENT:  Head: Normocephalic and atraumatic.  Mouth/Throat: Oropharynx is clear and moist.  Eyes: Pupils are equal, round, and reactive to light.  Cardiovascular: Normal rate, regular rhythm and normal heart sounds.   Pulmonary/Chest: Effort normal and breath sounds normal. No respiratory distress. He has no wheezes.  Abdominal: Soft. Bowel sounds are normal. There is no tenderness. There is no rebound and no guarding.  Genitourinary:  Foley catheter in place draining dark urine  Musculoskeletal: He exhibits no edema.  Neurological: He is alert.  Oriented 2, follows commands, no facial droop noted, 5 out of 5 strength in all 4 extremities  Skin: Skin is warm and dry.  Nursing note and vitals reviewed.   ED Course  Procedures (including  critical care time) Labs Review Labs Reviewed  URINALYSIS, ROUTINE W REFLEX MICROSCOPIC (NOT AT [facility]) - Abnormal; Notable for the following:    pH 8.5 (*)    Hgb urine dipstick LARGE (*)    Protein, ur 100 (*)    Leukocytes, UA SMALL (*)    All other components within normal limits  URINE MICROSCOPIC-ADD ON - Abnormal; Notable for the following:    Bacteria, UA MANY (*)    All other components within normal limits  URINE CULTURE  CBC WITH DIFFERENTIAL/PLATELET  BASIC METABOLIC PANEL    Imaging Review No results found. I have personally reviewed and evaluated these images and lab results as part of my medical decision-making.   EKG Interpretation   Date/Time:  Thursday Feb 19 2016 06:18:55 EDT Ventricular Rate:  61 PR Interval:  180 QRS Duration: 147 QT Interval:  477 QTC Calculation: 480 R Axis:   -171 Text Interpretation:  Atrial-paced rhythm Probable left atrial enlargement  Nonspecific intraventricular conduction delay Confirmed by Benett Swoyer  MD,  [place] ([id number]) on 02/19/2016 6:25:06 AM      MDM   Final diagnoses:  None    Patient presents with altered mental status from home. Recent bladder cancer resection followed by urinary retention. Foley catheter in place and appears to be draining appropriately. Patient is oriented 2. He does have a history of dementia. Vital signs are reassuring. Patient is afebrile. Discussed with the patient's wife and family that this could be related to progressive dementia exacerbated by recent illness and urinary retention. He is currently taking Levaquin for a urinary tract infection and pharmacy affect could also be a consideration. Will get basic labwork, screen for occult infection, and obtain CT scan of the head given history of cancer.If all is negative, will have social work see the patient for home health options.    Merryl Hacker, MD 02/23/16 [phone]

## 2016-02-19 NOTE — Care Management (Signed)
Pt is from home, lives with his wife who is his primary caregiver. Pt has dementia. Pt's wife provided with list of PD caregivers. Pt's wife agreeable to [facility] nursing, PT , aid. Emphasized to family that this services is temporary. Pt's wife has requested [facility] for [facility] agency. Salli Real, of [facility], made aware of referral and will obtain pt info from chart. Pt's daughter made aware HH has 48 hours to make their first visit. Pt with periods of hypoxia, per MD and bedside RN. Pt will not require home O2 but will follow up with PCP for sleep study.

## 2016-02-19 NOTE — ED Provider Notes (Signed)
11:41 AM Assumed care from Dr. Dina Rich, please see their note for full history, physical and decision making until this point. In brief this is a 82 y.o. year old male who presented to the ED tonight with Altered Mental Status     Awaiting social work consult. Symptoms likely 2/2 levofloxacin use, no e/o worsening infection.  Nursing concerned as patietn became hypoxic while sleeping as low as low 80's. No h/o OSA. When awake, sats are in high 90's. I suspect this is 2/2 OSA, I discussed with hospitalist and they can't do sleep studies in the hospital, so will need outpatient sleep study. Orders put in for NA, PT, SW and RN at home to help family get some assistance in caring for patient.   Discharge instructions, including strict return precautions for new or worsening symptoms, given. Patient and/or family verbalized understanding and agreement with the plan as described.   Labs, studies and imaging reviewed by myself and considered in medical decision making if ordered. Imaging interpreted by radiology.  Labs Reviewed  CBC WITH DIFFERENTIAL/PLATELET - Abnormal; Notable for the following:    WBC 2.8 (*)    RBC 3.26 (*)    Hemoglobin 10.8 (*)    HCT 31.6 (*)    Platelets 96 (*)    Lymphs Abs 0.3 (*)    All other components within normal limits  BASIC METABOLIC PANEL - Abnormal; Notable for the following:    Glucose, Bld 100 (*)    BUN 22 (*)    Calcium 8.7 (*)    All other components within normal limits  URINALYSIS, ROUTINE W REFLEX MICROSCOPIC (NOT AT [facility]) - Abnormal; Notable for the following:    pH 8.5 (*)    Hgb urine dipstick LARGE (*)    Protein, ur 100 (*)    Leukocytes, UA SMALL (*)    All other components within normal limits  URINE MICROSCOPIC-ADD ON - Abnormal; Notable for the following:    Bacteria, UA MANY (*)    All other components within normal limits  URINE CULTURE    DG Chest 2 View  Final Result    CT Head Wo Contrast  Final Result      No Follow-up on  file.   Merrily Pew, MD 02/19/16 [phone]

## 2016-02-19 NOTE — ED Notes (Signed)
Discharged home with wife.  Resource numbers for private duty given to wife.  Discussed need to f/u with Dr. Moshe Cipro for sleep study.  Wife verbalized understanding.

## 2016-02-19 NOTE — ED Notes (Signed)
sats in the 80's on 2 liters.  Increased oxygen to 4 liters.

## 2016-02-19 NOTE — ED Notes (Signed)
Pt lives at home, brought in by [facility] for reportedly having hallucinations at home, seeing people in his house and getting aggressive with family members.

## 2016-02-19 NOTE — ED Notes (Signed)
Sats  down in the 70's. Pt asleep.   Pt placed on 2 liters [place] oxygen.  Dr. Dina Rich notified.  Orders received.

## 2016-02-20 ENCOUNTER — Encounter: Payer: Self-pay | Admitting: Family Medicine

## 2016-02-20 ENCOUNTER — Ambulatory Visit (INDEPENDENT_AMBULATORY_CARE_PROVIDER_SITE_OTHER): Payer: Medicare Other | Admitting: Family Medicine

## 2016-02-20 VITALS — BP 120/72 | HR 72 | Resp 18 | Ht 69.0 in | Wt 156.0 lb

## 2016-02-20 DIAGNOSIS — I255 Ischemic cardiomyopathy: Secondary | ICD-10-CM | POA: Diagnosis not present

## 2016-02-20 DIAGNOSIS — M5440 Lumbago with sciatica, unspecified side: Secondary | ICD-10-CM

## 2016-02-20 DIAGNOSIS — I10 Essential (primary) hypertension: Secondary | ICD-10-CM

## 2016-02-20 DIAGNOSIS — R296 Repeated falls: Secondary | ICD-10-CM

## 2016-02-20 DIAGNOSIS — I5022 Chronic systolic (congestive) heart failure: Secondary | ICD-10-CM

## 2016-02-20 DIAGNOSIS — I272 Other secondary pulmonary hypertension: Secondary | ICD-10-CM

## 2016-02-20 DIAGNOSIS — Z09 Encounter for follow-up examination after completed treatment for conditions other than malignant neoplasm: Secondary | ICD-10-CM | POA: Diagnosis not present

## 2016-02-20 NOTE — Assessment & Plan Note (Signed)
Increased shortness of breath  noted in pt with established heart failure and possible nocturnal wheezuing, needs overnight pulse oximetry, will refer

## 2016-02-20 NOTE — Assessment & Plan Note (Signed)
Stable, afebrile, catheter in place, and has antibiotics prescribed and he is taking them as prescribed (2 antibiotics) Urine culture is pending Has f/u with urology, which he needs to keep

## 2016-02-20 NOTE — Assessment & Plan Note (Addendum)
Chronic management to continue

## 2016-02-20 NOTE — Progress Notes (Signed)
   Subjective:    Patient ID: [name], male    DOB: 1926/03/24, 82 y.o.   MRN: [id number]  HPI Pt in for ED f/u from 1 day ago. Wife notes increased  shortness of breath esp at night and possible wheezing , states she was told at recent ED visit that he needs oxygen and is here to arrange asap Recent biopsy for urologic cancer is negative Diagnosed with UTI to explain his weakness ion ED yesterday and started on a 2nd antibiotic at the time Due to recurrent falls, overall debility and weakness, they have decided to get part time in home assistance and I will ask case worker to help them to navigat e this as well as for a "sooner than planned " home visit   Review of Systems See HPI Denies current fever, but increased weakness and shortness of breath Denies sinus pressure, nasal congestion, ear pain or sore throat. . Denies chest pains, palpitations and leg swelling Denies abdominal pain, nausea, vomiting,diarrhea or constipation.   Denies dysuria, frequency, hesitancy or incontinence. C/o chronic back pain and reduced mobility Denies headaches, seizures, numbness, or tingling. Denies depression, anxiety or insomnia. Denies skin break down or rash.         Objective:   Physical Exam BP 120/72 mmHg  Pulse 72  Resp 18  Ht 5\' 9"  (1.753 m)  Wt 156 lb (70.761 kg)  BMI 23.03 kg/m2  SpO2 99% Patient alert and oriented and in no cardiopulmonary distress at rest  HEENT: No facial asymmetry, EOMI,   oropharynx pink and moist.  Neck supple no JVD, no mass.  Chest: Clear to auscultation bilaterally.Decreased though adequate air entry  CVS: S1, S2 , no S3.Regular rate.  ABD: Soft non tender.   Ext: No edema  MS: decreased ROM spine, hips , knees and shoulders. Skin: Intact, bruises noted.  Psych: Good eye contact, normal affect. Memory impaired not anxious or depressed appearing.  CNS: CN 2-12 intact, power,  normal throughout.no focal deficits noted.       Assessment  & Plan:  Chronic systolic heart failure (HCC) Increased shortness of breath  noted in pt with established heart failure and possible nocturnal wheezuing, needs overnight pulse oximetry, will refer  Hospital discharge follow-up Stable, afebrile, catheter in place, and has antibiotics prescribed and he is taking them as prescribed (2 antibiotics) Urine culture is pending Has f/u with urology, which he needs to keep  Recurrent falls Increased weakness , recurrent falls, spouse states she is now ready to get in home health twice weekly, will ask for [facility] help  To navigate the process  Essential hypertension Controlled, no change in medication   Pulmonary hypertension ([place]) Shortness of breath and wheezing noted a at night increasingly by spouse, refer for overnight pulse oximetry  Backache Chronic management to continue

## 2016-02-20 NOTE — Assessment & Plan Note (Signed)
Shortness of breath and wheezing noted a at night increasingly by spouse, refer for overnight pulse oximetry

## 2016-02-20 NOTE — Assessment & Plan Note (Signed)
Controlled, no change in medication  

## 2016-02-20 NOTE — Patient Instructions (Signed)
Annual exam in June as before , call if you need me sooner  [place] you have and take as prescribed  You are referred for overnight testing for oxygen, nurse will explain  We will reach out to your caseworker Stanton Kidney, since you have decided on twice weekly in home help , so that she can help with arranging this and also because of your recent ED visits  Please keep appt with urologist  Careful not to fall  Thankful surgery showed no cancer

## 2016-02-20 NOTE — Assessment & Plan Note (Signed)
Increased weakness , recurrent falls, spouse states she is now ready to get in home health twice weekly, will ask for [facility] help  To navigate the process

## 2016-02-21 LAB — URINE CULTURE: Culture: 2000 — AB

## 2016-02-23 ENCOUNTER — Other Ambulatory Visit: Payer: Self-pay | Admitting: *Deleted

## 2016-02-23 ENCOUNTER — Ambulatory Visit: Payer: Self-pay | Admitting: Family Medicine

## 2016-02-23 NOTE — Patient Outreach (Signed)
Received  A call from spouse, reports on pt's recent ED visit- pt confused.  Spouse reports pt is doing better now, reason for call is Alvis Lemmings wants to send out a nurse but wanted to first see how many times [facility] nurse will be coming out.  RN CM informed spouse that [facility] RN CM is a benefit of pt's insurance and Primary Care MD, that home health can be involved at the same time.   As discussed, spouse to call [facility] back.       Plan to f/u with pt 6/1- home visit.     [name].   [place]  [phone]

## 2016-02-24 ENCOUNTER — Telehealth: Payer: Self-pay | Admitting: Family Medicine

## 2016-02-24 DIAGNOSIS — I110 Hypertensive heart disease with heart failure: Secondary | ICD-10-CM | POA: Diagnosis not present

## 2016-02-24 DIAGNOSIS — I5022 Chronic systolic (congestive) heart failure: Secondary | ICD-10-CM | POA: Diagnosis not present

## 2016-02-24 NOTE — Telephone Encounter (Signed)
Left message on voicemail that he was referred for overnight testing for oxygen

## 2016-02-24 NOTE — Telephone Encounter (Signed)
Opening Home Health Case today, [name] is asking about the Oxygen for home use. [name] states he is pretty short of breath today, please advise?

## 2016-02-25 DIAGNOSIS — I5022 Chronic systolic (congestive) heart failure: Secondary | ICD-10-CM | POA: Diagnosis not present

## 2016-02-25 DIAGNOSIS — I110 Hypertensive heart disease with heart failure: Secondary | ICD-10-CM | POA: Diagnosis not present

## 2016-02-27 ENCOUNTER — Other Ambulatory Visit: Payer: Self-pay

## 2016-02-27 MED ORDER — FENTANYL 25 MCG/HR TD PT72: 25.0000 ug | MEDICATED_PATCH | TRANSDERMAL | Status: DC

## 2016-03-01 ENCOUNTER — Emergency Department (HOSPITAL_COMMUNITY)
Admission: EM | Admit: 2016-03-01 | Discharge: 2016-03-01 | Disposition: A | Discharge status: Home / Self Care | Payer: Medicare Other | Attending: Emergency Medicine | Admitting: Emergency Medicine

## 2016-03-01 ENCOUNTER — Encounter (HOSPITAL_COMMUNITY): Payer: Self-pay | Admitting: Emergency Medicine

## 2016-03-01 ENCOUNTER — Telehealth: Payer: Self-pay | Admitting: Family Medicine

## 2016-03-01 DIAGNOSIS — Z791 Long term (current) use of non-steroidal anti-inflammatories (NSAID): Secondary | ICD-10-CM | POA: Diagnosis not present

## 2016-03-01 DIAGNOSIS — Z87891 Personal history of nicotine dependence: Secondary | ICD-10-CM | POA: Insufficient documentation

## 2016-03-01 DIAGNOSIS — R319 Hematuria, unspecified: Secondary | ICD-10-CM | POA: Insufficient documentation

## 2016-03-01 DIAGNOSIS — M1990 Unspecified osteoarthritis, unspecified site: Secondary | ICD-10-CM | POA: Insufficient documentation

## 2016-03-01 DIAGNOSIS — I5022 Chronic systolic (congestive) heart failure: Secondary | ICD-10-CM | POA: Diagnosis not present

## 2016-03-01 DIAGNOSIS — Z7982 Long term (current) use of aspirin: Secondary | ICD-10-CM | POA: Insufficient documentation

## 2016-03-01 DIAGNOSIS — G309 Alzheimer's disease, unspecified: Secondary | ICD-10-CM | POA: Diagnosis not present

## 2016-03-01 DIAGNOSIS — M545 Low back pain, unspecified: Secondary | ICD-10-CM

## 2016-03-01 DIAGNOSIS — I110 Hypertensive heart disease with heart failure: Secondary | ICD-10-CM | POA: Diagnosis not present

## 2016-03-01 LAB — URINALYSIS, ROUTINE W REFLEX MICROSCOPIC
Glucose, UA: NEGATIVE mg/dL
KETONES UR: NEGATIVE mg/dL
Nitrite: NEGATIVE
PH: 5 (ref 5.0–8.0)
Protein, ur: 100 mg/dL — AB
SPECIFIC GRAVITY, URINE: 1.02 (ref 1.005–1.030)

## 2016-03-01 LAB — I-STAT CHEM 8, ED
BUN: 21 mg/dL — AB (ref 6–20)
CHLORIDE: 104 mmol/L (ref 101–111)
CREATININE: 1.1 mg/dL (ref 0.61–1.24)
Calcium, Ion: 1.16 mmol/L (ref 1.13–1.30)
Glucose, Bld: 95 mg/dL (ref 65–99)
HEMATOCRIT: 32 % — AB (ref 39.0–52.0)
Hemoglobin: 10.9 g/dL — ABNORMAL LOW (ref 13.0–17.0)
Potassium: 3.6 mmol/L (ref 3.5–5.1)
SODIUM: 144 mmol/L (ref 135–145)
TCO2: 26 mmol/L (ref 0–100)

## 2016-03-01 LAB — URINE MICROSCOPIC-ADD ON

## 2016-03-01 MED ORDER — MORPHINE SULFATE (PF) 4 MG/ML IV SOLN
4.0000 mg | Freq: Once | INTRAVENOUS | Status: AC
  Administered 2016-03-01: 4 mg via INTRAMUSCULAR
  Filled 2016-03-01: qty 1

## 2016-03-01 MED ORDER — KETOROLAC TROMETHAMINE 30 MG/ML IJ SOLN
15.0000 mg | Freq: Once | INTRAMUSCULAR | Status: AC
  Administered 2016-03-01: 15 mg via INTRAMUSCULAR
  Filled 2016-03-01: qty 1

## 2016-03-01 NOTE — ED Provider Notes (Signed)
CSN: [id number]     Arrival date & time 03/01/16  1632 History   First MD Initiated Contact with Patient 03/01/16 1855     Chief Complaint  Patient presents with  . Back Pain     (Consider location/radiation/quality/duration/timing/severity/associated sxs/prior Treatment) Patient is a 82 y.o. male presenting with back pain.  Back Pain Quality:  Aching Pain severity:  Mild Onset quality:  Gradual Timing:  Constant Chronicity:  Chronic   Past Medical History  Diagnosis Date  . Chronic systolic heart failure ([place])   . Rash and other nonspecific skin eruption   . Costochondritis, acute   . Glaucoma   . Erectile dysfunction   . Visual changes   . Pancytopenia   . Chronic pancreatitis ([place])     Based on CT findings  . History of melanoma in situ     JAN 2012--  LEFT HEEL W/ SLN BX  . Chronic back pain   . IC (interstitial cystitis)   . BPH (benign prostatic hypertrophy)   . Cardiac pacemaker     CARDIOLOGIST-- DR Cristopher Peru (LAST PACE [facility] 05-15-2013)  . Asymptomatic carotid artery stenosis     BILATERAL MILD ICA---  <50% PER DUPLEX 03-08-2008  . History of syncope   . Hypertension   . CHB (complete heart block) ([place])   . Nonischemic cardiomyopathy ([place])   . Severe mitral regurgitation   . Urge urinary incontinence   . A-fib ([place])   . Dementia   . Alzheimer disease   . Arthritis   . Cancer ([place])   . Poor historian   . HOH (hard of hearing)    Past Surgical History  Procedure Laterality Date  . Cataract extraction, bilateral  left  1997/   right 2003  . Pacemaker insertion  12-06-2008  DR GREGG TAYLOR    BiV PPM  --  [employer]  . Colonoscopy  08/18/2006    [id number] granularity and erosions of the rectum of uncertain significance biopsied.  A long redundant, but otherwise normal-appearing colon.melanosi coli and minimal inflammation on bx  . Inguinal hernia repair Bilateral AS TEEN  . Interstim implant placement  2001  &  2007  . Cysto/ hod/  replacement  interstim implant  05-14-2003  . Removal interstim implant/  cyst/  hod  04-14-2004  . Revision interstim implant and replace generator  05-15-2009    left upper buttock for urge urinary incontinence  . Wide excision left heel and sln bx  JUNE 2012  . Tonsillectomy  1950  . Left elbow surgery  2002  . Back surgery  X3  LAST ONE'90's  . Transthoracic echocardiogram  12-06-2008    LVSF 55-60%/  MODERATE MR/  MILD AR/  QUESTION DISTAL POSTERIOR HYPOKINESIS  . Interstim implant revision N/A 08/28/2013    Procedure: REPLACEMENT OF INTERSTIM-GENERATOR AND LEAD ;  Surgeon: [name], MD;  Location: [facility];  Service: Urology;  Laterality: N/A;  . Esophagogastroduodenoscopy N/A 03/28/2014    Dr. [name]: normal esophagus s/p dilation, mosaic appearance - chronic inflammation noted on bx  . Savory dilation N/A 03/28/2014    Procedure: SAVORY DILATION;  Surgeon: [name], MD;  [facility];  Service: Endoscopy;  Laterality: N/A;  Venia Minks dilation N/A 03/28/2014    Procedure: Venia Minks DILATION;  Surgeon: [name], MD;  [facility];  Service: Endoscopy;  Laterality: N/A;  . Colonoscopy N/A 08/05/2014    Procedure: COLONOSCOPY;  Surgeon: [name],  MD;  [facility];  Service: Endoscopy;  Laterality: N/A;  PT NEEDS TCS AT 1000.  . Implantable cardioverter defibrillator (icd) generator change N/A 10/11/2014    Procedure: ICD GENERATOR CHANGE;  Surgeon: [name], MD;  Location: [facility] CATH LAB;  Service: Cardiovascular;  Laterality: N/A;  . Transurethral resection of bladder tumor with gyrus (turbt-gyrus) N/A 02/11/2016    Procedure: TRANSURETHRAL RESECTION OF BLADDER TUMOR WITH GYRUS (TURBT-GYRUS);  Surgeon: Cleon Gustin, MD;  Location: [facility] ORS;  Service: Urology;  Laterality: N/A;  . Cystoscopy w/ retrogrades N/A 02/11/2016    Procedure: CYSTOSCOPY WITH RETROGRADE PYELOGRAM;  Surgeon: Cleon Gustin, MD;  Location: [facility] OR[facility];   Service: Urology;  Laterality: N/A;   Family History  Problem Relation Age of Onset  . Dementia Mother   . Throat cancer Father   . Cancer Father 86    throat  . Lung cancer Sister   . Heart disease Brother    Social History  Substance Use Topics  . Smoking status: Former Smoker -- 0.02 packs/day for 30 years    Types: Cigarettes    Quit date: 08/24/1997  . Smokeless tobacco: Never Used  . Alcohol Use: No    Review of Systems  Respiratory: Negative for cough and shortness of breath.   Genitourinary: Positive for hematuria.  Musculoskeletal: Positive for back pain.  All other systems reviewed and are negative.     Allergies  Codeine and Penicillins  Home Medications   Prior to Admission medications   Medication Sig Start Date End Date Taking? Authorizing Provider  amLODipine (NORVASC) 2.5 MG tablet TAKE 1 TABLET (2.5 MG TOTAL) BY MOUTH DAILY. 02/02/16  Yes Fayrene Helper, MD  cefpodoxime (VANTIN) 200 MG tablet Take 200 mg by mouth 2 (two) times daily.  02/18/16  Yes Historical Provider, MD  diclofenac sodium (VOLTAREN) 1 % GEL Apply 2 g topically daily as needed (pain). Reported on 01/05/2016 12/04/15  Yes Historical Provider, MD  fentaNYL (DURAGESIC - DOSED MCG/HR) 25 MCG/HR patch Place 1 patch (25 mcg total) onto the skin every 3 (three) days. 02/27/16  Yes Fayrene Helper, MD  furosemide (LASIX) 20 MG tablet TAKE AS DIRECTED Patient taking differently: Take 20 mg by mouth daily. May take twice if needed. 02/05/16  Yes [name], MD  ibuprofen (ADVIL,MOTRIN) 200 MG tablet Take 400 mg by mouth every 6 (six) hours as needed for moderate pain.   Yes Historical Provider, MD  Meth-Hyo-M Bl-Na Phos-Ph Sal (URIBEL) 118 MG CAPS Take 1 capsule (118 mg total) by mouth 2 (two) times daily as needed (bladder). 02/11/16  Yes Cleon Gustin, MD  polyethylene glycol powder (GLYCOLAX/MIRALAX) powder Take 17 g by mouth daily. Patient taking differently: Take 17 g by mouth daily as  needed for mild constipation.  05/16/15  Yes Fayrene Helper, MD  traMADol (ULTRAM) 50 MG tablet Take 1 tablet (50 mg total) by mouth every 4 (four) hours as needed for moderate pain. 02/11/16  Yes Cleon Gustin, MD  levofloxacin (LEVAQUIN) 500 MG tablet Take 1 tablet (500 mg total) by mouth daily. Patient not taking: Reported on 03/01/2016 02/14/16   Roxanna Mew, PA-C   BP 121/84 mmHg  Pulse 68  Temp(Src) 97.4 F (36.3 C) (Oral)  Resp 18  Ht 5\' 11"  (1.803 m)  Wt 155 lb (70.308 kg)  BMI 21.63 kg/m2  SpO2 98% Physical Exam  Constitutional: He is oriented to person, place, and time. He appears  well-developed and well-nourished.  HENT:  Head: Normocephalic and atraumatic.  Neck: Normal range of motion.  Cardiovascular: Normal rate.   Pulmonary/Chest: Effort normal and breath sounds normal. No respiratory distress.  Abdominal: Soft. He exhibits no distension.  Musculoskeletal: Normal range of motion. He exhibits tenderness (bilateral cva).  Neurological: He is alert and oriented to person, place, and time. No cranial nerve deficit.  Skin: Skin is warm and dry. No erythema.  Nursing note and vitals reviewed.   ED Course  Procedures (including critical care time) Labs Review Labs Reviewed  URINALYSIS, ROUTINE W REFLEX MICROSCOPIC (NOT AT [facility]) - Abnormal; Notable for the following:    Color, Urine BROWN (*)    APPearance CLOUDY (*)    Hgb urine dipstick LARGE (*)    Bilirubin Urine MODERATE (*)    Protein, ur 100 (*)    Leukocytes, UA MODERATE (*)    All other components within normal limits  URINE MICROSCOPIC-ADD ON - Abnormal; Notable for the following:    Squamous Epithelial / LPF 6-30 (*)    Bacteria, UA MANY (*)    All other components within normal limits  I-STAT CHEM 8, ED - Abnormal; Notable for the following:    BUN 21 (*)    Hemoglobin 10.9 (*)    HCT 32.0 (*)    All other components within normal limits  URINE CULTURE    Imaging Review No results  found. I have personally reviewed and evaluated these images and lab results as part of my medical decision-making.   EKG Interpretation None      MDM   Final diagnoses:  Bilateral low back pain without sciatica    Acute exacerbation of chronic back pain. Had some hematuria so I checked a creatinine and it was within normal limits. His pain improved with IM shot of Toradol morphine. Doubt any spinal fractures. No evidence of renal failure. His artery on antibiotics for urinary tract infection. Radius follow-up with urology on Wednesday to address the hematuria and the Foley catheter. Patient is stable for discharge home with continued follow up with PCP for pain management.   New Prescriptions: Discharge Medication List as of 03/01/2016  8:01 PM       I have personally and contemperaneously reviewed labs and imaging and used in my decision making as above.   A medical screening exam was performed and I feel the patient has had an appropriate workup for their chief complaint at this time and likelihood of emergent condition existing is low and thus workup can continue on an outpatient basis.. Their vital signs are stable. They have been counseled on decision, discharge, follow up and which symptoms necessitate immediate return to the emergency department.  They verbally stated understanding and agreement with plan and discharged in stable condition.      Merrily Pew, MD 03/01/16 [phone]

## 2016-03-01 NOTE — ED Notes (Signed)
Pt states he had prostate sx on the 10th and has been having low back pain since then.  Pain was before sx but has worsened.

## 2016-03-01 NOTE — ED Notes (Signed)
Pt states understanding of care given and follow up instructions.  Taken from the ED to vehicle in wheelchair for spouse to transport home.

## 2016-03-01 NOTE — Telephone Encounter (Signed)
Pt called earlier this evening stating pt was bleeding from his catheter which he has had following recent GU surgery and which was cscheduled to be removed in 2 days.No known trauma. I recommended Ed evaluation, he does have pancytopenia

## 2016-03-02 ENCOUNTER — Telehealth: Payer: Self-pay | Admitting: Family Medicine

## 2016-03-02 DIAGNOSIS — I5022 Chronic systolic (congestive) heart failure: Secondary | ICD-10-CM | POA: Diagnosis not present

## 2016-03-02 DIAGNOSIS — I110 Hypertensive heart disease with heart failure: Secondary | ICD-10-CM | POA: Diagnosis not present

## 2016-03-02 DIAGNOSIS — R0602 Shortness of breath: Secondary | ICD-10-CM | POA: Diagnosis not present

## 2016-03-02 NOTE — Telephone Encounter (Signed)
Noted.  Patient will have to retested due to only wearing equipment for 1:46 instead of 2 hours

## 2016-03-02 NOTE — Telephone Encounter (Signed)
Pls arrange for nocturnal oxygen humidified at 2l/min, test result shows that he does qualify

## 2016-03-03 ENCOUNTER — Ambulatory Visit (INDEPENDENT_AMBULATORY_CARE_PROVIDER_SITE_OTHER): Payer: Medicare Other | Admitting: Urology

## 2016-03-03 DIAGNOSIS — N390 Urinary tract infection, site not specified: Secondary | ICD-10-CM

## 2016-03-03 DIAGNOSIS — N411 Chronic prostatitis: Secondary | ICD-10-CM | POA: Diagnosis not present

## 2016-03-03 NOTE — Patient Outreach (Signed)
[facility]) Care Management  03/03/2016  Valon Reinoehl 06-Mar-1926 [id number]   Chart review for chart audit.     [name].   [facility]  [phone]

## 2016-03-04 ENCOUNTER — Other Ambulatory Visit: Payer: Self-pay | Admitting: *Deleted

## 2016-03-04 DIAGNOSIS — I5022 Chronic systolic (congestive) heart failure: Secondary | ICD-10-CM | POA: Diagnosis not present

## 2016-03-04 DIAGNOSIS — I110 Hypertensive heart disease with heart failure: Secondary | ICD-10-CM | POA: Diagnosis not present

## 2016-03-04 LAB — URINE CULTURE: Culture: >=100000 — AB

## 2016-03-04 NOTE — Patient Outreach (Signed)
[name]) Care Management   03/04/2016  [name] Oct 21, 1925 [id number]  [name] is an 82 y.o. male  Subjective:  Spouse reports on pt's recent ED visit (5/29), pt had blood in urine, was told came from incision (recent  Procedure tumor removed from bladder). Spouse reports pt received 2 shots, bleeding stopped.  Pt reports he has not had any bleeding since.   Spouse reports pt f/u with Urologist yesterday, catheter removed.  Pt reports no problems urinating.  Spouse reports pt was placed on an antibiotic, does not know the name, put it in pill planner, threw out the rx bottle.    Spouse reports  received a call that's pt's oxygen will be delivered today, pt to use the oxygen at night.    Spouse reports HH RN, PT are coming.   Spouse reports she started weighing pt for a short while, but it got to much for her to do.   Objective:  Filed Vitals:   03/04/16 1045 03/04/16 1051  BP: 104/70   Pulse: 60 99  Resp: 24     ROS  Physical Exam  Constitutional: He is oriented to person, place, and time. He appears well-developed and well-nourished.  Cardiovascular: Normal rate, regular rhythm and normal heart sounds.   Respiratory: Breath sounds normal.  Sob noted at rest and with ambulation.   GI: Soft. Bowel sounds are normal.  Musculoskeletal: Normal range of motion.  Unsteady when walking  Neurological: He is alert and oriented to person, place, and time.  Skin: Skin is warm and dry.  Psychiatric: He has a normal mood and affect. His behavior is normal. Judgment and thought content normal.    Encounter Medications:  Reviewed with spouse (does pt's pill planner) Outpatient Encounter Prescriptions as of 03/04/2016  Medication Sig Note  . amLODipine (NORVASC) 2.5 MG tablet TAKE 1 TABLET (2.5 MG TOTAL) BY MOUTH DAILY.   . cefpodoxime (VANTIN) 200 MG tablet Take 200 mg by mouth 2 (two) times daily.  02/19/2016: Received from: External Pharmacy  . diclofenac sodium  (VOLTAREN) 1 % GEL Apply 2 g topically daily as needed (pain). Reported on 01/05/2016   . fentaNYL (DURAGESIC - DOSED MCG/HR) 25 MCG/HR patch Place 1 patch (25 mcg total) onto the skin every 3 (three) days.   . furosemide (LASIX) 20 MG tablet TAKE AS DIRECTED (Patient taking differently: Take 20 mg by mouth daily. May take twice if needed.)   . ibuprofen (ADVIL,MOTRIN) 200 MG tablet Take 400 mg by mouth every 6 (six) hours as needed for moderate pain.   . Meth-Hyo-M Bl-Na Phos-Ph Sal (URIBEL) 118 MG CAPS Take 1 capsule (118 mg total) by mouth 2 (two) times daily as needed (bladder).   . polyethylene glycol powder (GLYCOLAX/MIRALAX) powder Take 17 g by mouth daily.   Marland Kitchen levofloxacin (LEVAQUIN) 500 MG tablet Take 1 tablet (500 mg total) by mouth daily. (Patient not taking: Reported on 03/01/2016)   . traMADol (ULTRAM) 50 MG tablet Take 1 tablet (50 mg total) by mouth every 4 (four) hours as needed for moderate pain. (Patient not taking: Reported on 03/04/2016)    No facility-administered encounter medications on file as of 03/04/2016.    Functional Status:   In your present state of health, do you have any difficulty performing the following activities: 02/06/2016 01/05/2016  Hearing? [name]  Vision? Y Y  Difficulty concentrating or making decisions? [name]  Walking or climbing stairs? N Y  Dressing or bathing? N N  Doing errands, shopping? N Y  Using the Toilet? - N  In the past six months, have you accidently leaked urine? - N  Do you have problems with loss of bowel control? - N  Managing your Medications? - Y  Managing your Finances? - N  Housekeeping or managing your Housekeeping? - N    Fall/Depression Screening:    PHQ 2/9 Scores 01/05/2016 11/25/2015 04/11/2015 01/14/2015 01/14/2015 04/25/2013  PHQ - 2 Score 1 1 0 0 1 0    Assessment:  Pleasant 82 year old gentleman, lives with spouse (main caregiver).                          HF-  Lungs clear.  Noted pt sob at rest and with ambulation.  Oxygen  saturation at rest 95/HR 60, with ambulation 91%,HR 99.    Oxygen scheduled to be delivered today(called spouse during h/v, on his way) but oxygen  only ordered for night time use.  Review of pt's recorded weights- most recent 152 lbs.  ranges 149-155.8 lbs.   RN CM to call Dr. Moshe Cipro report noted sob.    Plan:  RN CM made attempts  to call Dr. Griffin Dakin office, report pt's sob- received voice message office closed.                Plan to try again.              Pt to f/u with Dr. Moshe Cipro 6/22.             As discussed with spouse, to have pt weigh 1-2 times a week, record, call MD for weigh gain of 3 lbs                  In a day, 5 lbs in a week.             Plan to  Continue to provide pt with community nurse case management services, next home visit 7/5.    [facility] CM Care Plan Problem One        Most Recent Value   Care Plan Problem One  Better management of HF related to hx of dementia, compliance with medication    Role Documenting the Problem One  Care Management Coordinator   Care Plan for Problem One  Active   [facility] Long Term Goal (31-90 days)  Spouse would  assist pt to better manage his HF in the next 60 days    [facility] Long Term Goal Start Date  03/04/16   Interventions for Problem One Long Term Goal  Discussed with spouse s/s of HF to report to MD.    [facility] CM Short Term Goal #2 (0-30 days)  Pt's sob during the day would improve after doing oxygen at night in the next 30 days    [facility] CM Short Term Goal #2 Start Date  03/04/16   Interventions for Short Term Goal #2  Discussed with spouse to call MD if pt is more sob during the day.     [facility] CM Short Term Goal #3 (0-30 days)  Pt would be compliant with medications as prescirbed for the next 30 days    [facility] CM Short Term Goal #3 Start Date  02/02/16   [facility] CM Short Term Goal #3 Met Date  03/04/16   Interventions for Short Tern Goal #3  Discussed with spouse to let Heart MD know at upcoming office visit 5/2- pt  not taking his Potassium    [facility] CM  Short Term Goal #4 (0-30 days)  Pt would weigh 1-2  times a week/record/call MD if weight gain of >2 lbs in a day, 5 lbs in a week.    [facility] CM Short Term Goal #4 Start Date  03/04/16   Interventions for Short Term Goal #4  Reviewed with spouse importance of pt weighing, recording.       Addendum- 11:49 am - RN CM called Dr. Griffin Dakin office after leaving pt's home, spoke with Loma Sousa (MD' nurse), reported pt's sob at rest and with ambulation.  Loma Sousa reports pt needs to be retested, MD wants pt to try the '                      Nocturnal oxygen first, see if other resolves.  Courtney to make note to f/u with pt next week.    [name].   [facility]  [phone]

## 2016-03-05 ENCOUNTER — Telehealth: Payer: Self-pay | Admitting: *Deleted

## 2016-03-05 NOTE — Telephone Encounter (Deleted)
Post ED Visit - Positive Culture Follow-up: Unsuccessful Patient Follow-up  Culture assessed and recommendations reviewed by: []  Elenor Quinones, Pharm.D. []  Heide Guile, Pharm.D., BCPS []  Parks Neptune, Pharm.D. []  Alycia Rossetti, Pharm.D., BCPS []  [place], [place].D., BCPS, AAHIVP []  Legrand Como, Pharm.D., BCPS, AAHIVP []  Milus Glazier, Pharm.D. []  Stephens November, Pharm.D.  Positive *** culture  [x]  Patient discharged without antimicrobial prescription and treatment is now indicated []  Organism is resistant to prescribed ED discharge antimicrobial []  Patient with positive blood cultures   Unable to contact patient after 3 attempts, letter will be sent to address on file  Ardeen Fillers 03/05/2016, 12:10 PM

## 2016-03-05 NOTE — Progress Notes (Signed)
ED Antimicrobial Stewardship Positive Culture Follow Up   [name] is an 82 y.o. male who presented to [facility] on 03/01/2016 with a chief complaint of  Chief Complaint  Patient presents with  . Back Pain    Recent Results (from the past 720 hour(s))  Urine culture     Status: Abnormal   Collection Time: 02/19/16  6:00 AM  Result Value Ref Range Status   Specimen Description URINE, CATHETERIZED  Final   Special Requests NONE  Final   Culture (A)  Final    2,000 COLONIES/mL INSIGNIFICANT GROWTH Performed at [facility]    Report Status 02/21/2016 FINAL  Final  Urine culture     Status: Abnormal   Collection Time: 03/01/16  6:00 PM  Result Value Ref Range Status   Specimen Description URINE, CLEAN CATCH  Final   Special Requests NONE  Final   Culture >=100,000 COLONIES/mL ENTEROCOCCUS SPECIES (A)  Final   Report Status 03/04/2016 FINAL  Final   Organism ID, Bacteria ENTEROCOCCUS SPECIES (A)  Final      Susceptibility   Enterococcus species - MIC*    AMPICILLIN <=2 SENSITIVE Sensitive     LEVOFLOXACIN >=8 RESISTANT Resistant     NITROFURANTOIN <=16 SENSITIVE Sensitive     VANCOMYCIN 1 SENSITIVE Sensitive     * >=100,000 COLONIES/mL ENTEROCOCCUS SPECIES     [x]  Patient discharged originally without antimicrobial agent and treatment is now indicated  New antibiotic prescription: Fosfomycin 3 gm PO x 1  ED Provider: Alecia Lemming, PA-C  Makenzee Choudhry L. Nicole Kindred, PharmD PGY2 Infectious Diseases Pharmacy Resident Pager: [phone] 03/05/2016 9:38 AM

## 2016-03-09 DIAGNOSIS — I110 Hypertensive heart disease with heart failure: Secondary | ICD-10-CM | POA: Diagnosis not present

## 2016-03-09 DIAGNOSIS — I5022 Chronic systolic (congestive) heart failure: Secondary | ICD-10-CM | POA: Diagnosis not present

## 2016-03-10 ENCOUNTER — Encounter: Payer: Self-pay | Admitting: Family Medicine

## 2016-03-10 NOTE — ED Notes (Unsigned)
Post ED Visit - Positive Culture Follow-up: Successful Patient Follow-Up  Culture assessed and recommendations reviewed by: []  Elenor Quinones, Pharm.D. []  Heide Guile, Pharm.D., BCPS []  Parks Neptune, Pharm.D. []  Alycia Rossetti, Pharm.D., BCPS []  [place], [place].D., BCPS, AAHIVP []  Legrand Como, Pharm.D., BCPS, AAHIVP []  Cassie Stewart, Pharm.D. []  Stephens November, Pharm.D.  Positive *** culture  [x]  Patient discharged without antimicrobial prescription and treatment is now indicated []  Organism is resistant to prescribed ED discharge antimicrobial []  Patient with positive blood cultures  Changes discussed with ED provider, Carlisle Cater, PA-C: New antibiotic prescription for Fosfomycin 3 gram PO X 1 dose; Pt states that he is currently being followed by urology and is being treated for a UTI and is declining new prescription.   Contacted by patient, date 03/10/2016, time [place], [place] 03/10/2016, 9:41 AM

## 2016-03-15 ENCOUNTER — Emergency Department (HOSPITAL_COMMUNITY): Payer: Medicare Other

## 2016-03-15 ENCOUNTER — Encounter (HOSPITAL_COMMUNITY): Payer: Self-pay | Admitting: Emergency Medicine

## 2016-03-15 ENCOUNTER — Telehealth: Payer: Self-pay

## 2016-03-15 ENCOUNTER — Observation Stay (HOSPITAL_COMMUNITY)
Admission: EM | Admit: 2016-03-15 | Discharge: 2016-03-16 | Disposition: A | Discharge status: Home / Self Care | Payer: Medicare Other | Attending: Family Medicine | Admitting: Family Medicine

## 2016-03-15 DIAGNOSIS — R0600 Dyspnea, unspecified: Secondary | ICD-10-CM

## 2016-03-15 DIAGNOSIS — I110 Hypertensive heart disease with heart failure: Secondary | ICD-10-CM | POA: Insufficient documentation

## 2016-03-15 DIAGNOSIS — I340 Nonrheumatic mitral (valve) insufficiency: Secondary | ICD-10-CM

## 2016-03-15 DIAGNOSIS — N390 Urinary tract infection, site not specified: Secondary | ICD-10-CM | POA: Diagnosis not present

## 2016-03-15 DIAGNOSIS — R079 Chest pain, unspecified: Secondary | ICD-10-CM

## 2016-03-15 DIAGNOSIS — Z87891 Personal history of nicotine dependence: Secondary | ICD-10-CM | POA: Diagnosis not present

## 2016-03-15 DIAGNOSIS — G309 Alzheimer's disease, unspecified: Secondary | ICD-10-CM | POA: Diagnosis not present

## 2016-03-15 DIAGNOSIS — I481 Persistent atrial fibrillation: Secondary | ICD-10-CM

## 2016-03-15 DIAGNOSIS — I5022 Chronic systolic (congestive) heart failure: Secondary | ICD-10-CM | POA: Diagnosis not present

## 2016-03-15 DIAGNOSIS — I4891 Unspecified atrial fibrillation: Secondary | ICD-10-CM | POA: Insufficient documentation

## 2016-03-15 DIAGNOSIS — I5021 Acute systolic (congestive) heart failure: Secondary | ICD-10-CM

## 2016-03-15 DIAGNOSIS — Z950 Presence of cardiac pacemaker: Secondary | ICD-10-CM | POA: Insufficient documentation

## 2016-03-15 DIAGNOSIS — R0602 Shortness of breath: Secondary | ICD-10-CM | POA: Diagnosis not present

## 2016-03-15 DIAGNOSIS — F0280 Dementia in other diseases classified elsewhere without behavioral disturbance: Secondary | ICD-10-CM

## 2016-03-15 DIAGNOSIS — Z79899 Other long term (current) drug therapy: Secondary | ICD-10-CM | POA: Insufficient documentation

## 2016-03-15 DIAGNOSIS — I272 Other secondary pulmonary hypertension: Secondary | ICD-10-CM

## 2016-03-15 DIAGNOSIS — I071 Rheumatic tricuspid insufficiency: Secondary | ICD-10-CM

## 2016-03-15 DIAGNOSIS — I4819 Other persistent atrial fibrillation: Secondary | ICD-10-CM | POA: Diagnosis present

## 2016-03-15 DIAGNOSIS — D61818 Other pancytopenia: Secondary | ICD-10-CM | POA: Diagnosis present

## 2016-03-15 DIAGNOSIS — R339 Retention of urine, unspecified: Secondary | ICD-10-CM | POA: Diagnosis not present

## 2016-03-15 DIAGNOSIS — I509 Heart failure, unspecified: Secondary | ICD-10-CM | POA: Diagnosis not present

## 2016-03-15 LAB — CBC
HEMATOCRIT: 30.9 % — AB (ref 39.0–52.0)
HEMOGLOBIN: 10.5 g/dL — AB (ref 13.0–17.0)
MCH: 33.7 pg (ref 26.0–34.0)
MCHC: 34 g/dL (ref 30.0–36.0)
MCV: 99 fL (ref 78.0–100.0)
Platelets: 71 10*3/uL — ABNORMAL LOW (ref 150–400)
RBC: 3.12 MIL/uL — AB (ref 4.22–5.81)
RDW: 16.1 % — ABNORMAL HIGH (ref 11.5–15.5)
WBC: 2.5 10*3/uL — ABNORMAL LOW (ref 4.0–10.5)

## 2016-03-15 LAB — BASIC METABOLIC PANEL
ANION GAP: 5 (ref 5–15)
BUN: 19 mg/dL (ref 6–20)
CO2: 29 mmol/L (ref 22–32)
Calcium: 8.4 mg/dL — ABNORMAL LOW (ref 8.9–10.3)
Chloride: 105 mmol/L (ref 101–111)
Creatinine, Ser: 1.2 mg/dL (ref 0.61–1.24)
GFR, EST NON AFRICAN AMERICAN: 53 mL/min — AB (ref >60)
GLUCOSE: 118 mg/dL — AB (ref 65–99)
POTASSIUM: 3.7 mmol/L (ref 3.5–5.1)
Sodium: 139 mmol/L (ref 135–145)

## 2016-03-15 LAB — TROPONIN I
TROPONIN I: 0.54 ng/mL — AB (ref <0.031)
TROPONIN I: 0.6 ng/mL — AB (ref <0.031)

## 2016-03-15 MED ORDER — FENTANYL CITRATE (PF) 100 MCG/2ML IJ SOLN
40.0000 ug | INTRAMUSCULAR | Status: DC | PRN
  Administered 2016-03-15: 40 ug via INTRAVENOUS
  Filled 2016-03-15: qty 2

## 2016-03-15 MED ORDER — SODIUM CHLORIDE 0.9% FLUSH: 3.0000 mL | Freq: Two times a day (BID) | INTRAVENOUS | Status: DC

## 2016-03-15 MED ORDER — ACETAMINOPHEN 650 MG RE SUPP: 650.0000 mg | Freq: Four times a day (QID) | RECTAL | Status: DC | PRN

## 2016-03-15 MED ORDER — SODIUM CHLORIDE 0.9 % IV SOLN: 250.0000 mL | INTRAVENOUS | Status: DC | PRN

## 2016-03-15 MED ORDER — ACETAMINOPHEN 325 MG PO TABS: 650.0000 mg | ORAL_TABLET | Freq: Four times a day (QID) | ORAL | Status: DC | PRN

## 2016-03-15 MED ORDER — FUROSEMIDE 10 MG/ML IJ SOLN
40.0000 mg | Freq: Two times a day (BID) | INTRAMUSCULAR | Status: DC
  Administered 2016-03-15 – 2016-03-16 (×2): 40 mg via INTRAVENOUS
  Filled 2016-03-15 (×2): qty 4

## 2016-03-15 MED ORDER — FENTANYL 25 MCG/HR TD PT72: 25.0000 ug | MEDICATED_PATCH | TRANSDERMAL | Status: DC

## 2016-03-15 MED ORDER — ASPIRIN EC 81 MG PO TBEC
81.0000 mg | DELAYED_RELEASE_TABLET | Freq: Every day | ORAL | Status: DC
  Administered 2016-03-15 – 2016-03-16 (×2): 81 mg via ORAL
  Filled 2016-03-15 (×2): qty 1

## 2016-03-15 MED ORDER — POLYETHYLENE GLYCOL 3350 17 GM/SCOOP PO POWD: 17.0000 g | Freq: Every day | ORAL | Status: DC

## 2016-03-15 MED ORDER — CEFPODOXIME PROXETIL 200 MG PO TABS
200.0000 mg | ORAL_TABLET | Freq: Two times a day (BID) | ORAL | Status: DC
  Administered 2016-03-15 – 2016-03-16 (×2): 200 mg via ORAL
  Filled 2016-03-15 (×4): qty 1

## 2016-03-15 MED ORDER — IOPAMIDOL (ISOVUE-370) INJECTION 76%
100.0000 mL | Freq: Once | INTRAVENOUS | Status: AC | PRN
  Administered 2016-03-15: 100 mL via INTRAVENOUS

## 2016-03-15 MED ORDER — FUROSEMIDE 10 MG/ML IJ SOLN
40.0000 mg | Freq: Once | INTRAMUSCULAR | Status: AC
  Administered 2016-03-15: 40 mg via INTRAVENOUS
  Filled 2016-03-15: qty 4

## 2016-03-15 MED ORDER — POLYETHYLENE GLYCOL 3350 17 G PO PACK: 17.0000 g | PACK | Freq: Every day | ORAL | Status: DC

## 2016-03-15 MED ORDER — SODIUM CHLORIDE 0.9% FLUSH: 3.0000 mL | INTRAVENOUS | Status: DC | PRN

## 2016-03-15 MED ORDER — SODIUM CHLORIDE 0.9% FLUSH
3.0000 mL | Freq: Two times a day (BID) | INTRAVENOUS | Status: DC
  Administered 2016-03-15 – 2016-03-16 (×2): 3 mL via INTRAVENOUS

## 2016-03-15 NOTE — H&P (Signed)
History and Physical  [name] [id number] DOB: [birth year] DOA: 03/15/2016  PCP: [name], MD  Radiologists: Dr. Harl Bowie Patient coming from: Home  Chief Complaint: Short of breath  HPI:  82 year old man presented to the emergency department with several day history of chest pain, shortness of breath. Initial evaluation suggested acute heart failure, treated with Lasix and referred for observation.  History obtained from patient and wife. Patient has mild dementia. Reports several day history of chest pain, constant in nature, made worse with exertion. Located in the left side of the chest. No diaphoresis, nausea or vomiting. Some left arm pain. Pain is located inferior to pacemaker and chest wall is somewhat tender to touch. Some shortness of breath. Increasing lower extremity edema. Reports compliance with diet and medications.  In the emergency department: Afebrile, vital signs stable, no hypoxia. Treated with Lasix. Pertinent labs: Troponin 0.54, BMP unremarkable, and CBC pancytopenia stable  EKG: Independently reviewed. Paced rhythm. Imaging: Chest CT noted. Chest x-ray independently reviewed. Pulmonary edema.  Review of Systems:  Negative for visual changes, sore throat, rash,dysuria, bleeding, n/v/abdominal pain.  Positive for subjective fever, back pain  Past Medical History  Diagnosis Date  . Chronic systolic heart failure ([place])   . Rash and other nonspecific skin eruption   . Costochondritis, acute   . Glaucoma   . Erectile dysfunction   . Visual changes   . Pancytopenia   . Chronic pancreatitis ([place])     Based on CT findings  . History of melanoma in situ     JAN 2012--  LEFT HEEL W/ SLN BX  . Chronic back pain   . IC (interstitial cystitis)   . BPH (benign prostatic hypertrophy)   . Cardiac pacemaker     CARDIOLOGIST-- DR Cristopher Peru (LAST PACE [facility] 05-15-2013)  . Asymptomatic carotid artery stenosis     BILATERAL MILD ICA---  <50% PER DUPLEX  03-08-2008  . History of syncope   . Hypertension   . CHB (complete heart block) ([place])   . Nonischemic cardiomyopathy ([place])   . Severe mitral regurgitation   . Urge urinary incontinence   . A-fib ([place])   . Dementia   . Alzheimer disease   . Arthritis   . Cancer ([place])   . Poor historian   . HOH (hard of hearing)     Past Surgical History  Procedure Laterality Date  . Cataract extraction, bilateral  left  1997/   right 2003  . Pacemaker insertion  12-06-2008  DR GREGG TAYLOR    BiV PPM  --  [employer]  . Colonoscopy  08/18/2006    [id number] granularity and erosions of the rectum of uncertain significance biopsied.  A long redundant, but otherwise normal-appearing colon.melanosi coli and minimal inflammation on bx  . Inguinal hernia repair Bilateral AS TEEN  . Interstim implant placement  2001  &  2007  . Cysto/ hod/  replacement interstim implant  05-14-2003  . Removal interstim implant/  cyst/  hod  04-14-2004  . Revision interstim implant and replace generator  05-15-2009    left upper buttock for urge urinary incontinence  . Wide excision left heel and sln bx  JUNE 2012  . Tonsillectomy  1950  . Left elbow surgery  2002  . Back surgery  X3  LAST ONE'90's  . Transthoracic echocardiogram  12-06-2008    LVSF 55-60%/  MODERATE MR/  MILD AR/  QUESTION DISTAL POSTERIOR HYPOKINESIS  . Interstim implant revision N/A 08/28/2013    Procedure:  REPLACEMENT OF INTERSTIM-GENERATOR AND LEAD ;  Surgeon: [name], MD;  Location[facility];  Service: Urology;  Laterality: N/A;  . Esophagogastroduodenoscopy N/A 03/28/2014    Dr. [name]: normal esophagus s/p dilation, mosaic appearance - chronic inflammation noted on bx  . Savory dilation N/A 03/28/2014    Procedure: SAVORY DILATION;  Surgeon: [name], MD;  Location: [facility];  Service: Endoscopy;  Laterality: N/A;  Venia Minks dilation N/A 03/28/2014    Procedure: Venia Minks DILATION;  Surgeon: [name], MD;   Location: [facility];  Service: Endoscopy;  Laterality: N/A;  . Colonoscopy N/A 08/05/2014    Procedure: COLONOSCOPY;  Surgeon: [name], MD;  Location: [facility];  Service: Endoscopy;  Laterality: N/A;  PT NEEDS TCS AT 1000.  . Implantable cardioverter defibrillator (icd) generator change N/A 10/11/2014    Procedure: ICD GENERATOR CHANGE;  Surgeon: [name], MD;  Location: [facility] CATH LAB;  Service: Cardiovascular;  Laterality: N/A;  . Transurethral resection of bladder tumor with gyrus (turbt-gyrus) N/A 02/11/2016    Procedure: TRANSURETHRAL RESECTION OF BLADDER TUMOR WITH GYRUS (TURBT-GYRUS);  Surgeon: Cleon Gustin, MD;  Location: [facility] ORS;  Service: Urology;  Laterality: N/A;  . Cystoscopy w/ retrogrades N/A 02/11/2016    Procedure: CYSTOSCOPY WITH RETROGRADE PYELOGRAM;  Surgeon: Cleon Gustin, MD;  Location: [facility];  Service: Urology;  Laterality: N/A;     reports that he quit smoking about 18 years ago. His smoking use included Cigarettes. He has a .6 pack-year smoking history. He has never used smokeless tobacco. He reports that he does not drink alcohol or use illicit drugs. Ambulatory status: Lives at home with wife, able to walk  Allergies  Allergen Reactions  . Codeine Diarrhea  . Penicillins     Other reaction(s): OTHER  nHas patient had a PCN reaction causing immediate rash, facial/tongue/throat swelling, SOB or lightheadedness with hypotension: Nono Has patient had a PCN reaction causing severe rash involving mucus membranes or skin necrosis: Nono Has patient had a PCN reaction that required hospitalization Nono Has patient had a PCN reaction occurring within the last 10 years: Nono If all of the above answers are "NO", then may    Family History  Problem Relation Age of Onset  . Dementia Mother   . Throat cancer Father   . Cancer Father 86    throat  . Lung cancer Sister   . Heart disease Brother      Prior to Admission medications   Medication  Sig Start Date End Date Taking? Authorizing Provider  amLODipine (NORVASC) 2.5 MG tablet TAKE 1 TABLET (2.5 MG TOTAL) BY MOUTH DAILY. 02/02/16  Yes Fayrene Helper, MD  cefpodoxime (VANTIN) 200 MG tablet Take 200 mg by mouth 2 (two) times daily.  02/18/16  Yes Historical Provider, MD  diclofenac sodium (VOLTAREN) 1 % GEL Apply 2 g topically daily as needed (pain). Reported on 01/05/2016 12/04/15  Yes Historical Provider, MD  fentaNYL (DURAGESIC - DOSED MCG/HR) 25 MCG/HR patch Place 1 patch (25 mcg total) onto the skin every 3 (three) days. 02/27/16  Yes Fayrene Helper, MD  furosemide (LASIX) 20 MG tablet TAKE AS DIRECTED Patient taking differently: Take 20 mg by mouth daily. May take twice if needed. 02/05/16  Yes [name], MD  ibuprofen (ADVIL,MOTRIN) 200 MG tablet Take 400 mg by mouth every 6 (six) hours as needed for moderate pain.   Yes Historical Provider, MD  Meth-Hyo-M  Bl-Na Phos-Ph Sal (URIBEL) 118 MG CAPS Take 1 capsule (118 mg total) by mouth 2 (two) times daily as needed (bladder). 02/11/16  Yes Cleon Gustin, MD  polyethylene glycol powder (GLYCOLAX/MIRALAX) powder Take 17 g by mouth daily. 05/16/15  Yes Fayrene Helper, MD  levofloxacin (LEVAQUIN) 500 MG tablet Take 1 tablet (500 mg total) by mouth daily. Patient not taking: Reported on 03/01/2016 02/14/16   Roxanna Mew, PA-C  traMADol (ULTRAM) 50 MG tablet Take 1 tablet (50 mg total) by mouth every 4 (four) hours as needed for moderate pain. Patient not taking: Reported on 03/04/2016 02/11/16   Cleon Gustin, MD    Physical Exam: Filed Vitals:   03/15/16 1430 03/15/16 1500 03/15/16 1530 03/15/16 1600  BP: 114/76 124/93 124/93 130/96  Pulse: 57 68  69  Temp:      TempSrc:      Resp: 20   18  Height:      Weight:      SpO2: 88% 96%  94%   Constitutional:  . Appears calm and comfortable, stands without assistance Eyes:  . PERRL and irises appear normal . Normal conjunctivae and lids ENMT:  . external  ears, nose appear normal . Slightly hard of hearing . Lips appear normal Neck:  . neck appears normal, no masses, normal ROM, supple . no thyromegaly Respiratory:  . Few posterior crackles. Otherwise good air movement. No wheezes or rhonchi..  . Respiratory effort normal. No retractions or accessory muscle use Cardiovascular:  . RRR, no m/r/g . Right greater than left ankle edema, 2+  . chest wall somewhat tender to touch on left side below pacemaker  Abdomen:  . Abdomen no tenderness or masses Musculoskeletal:  . RUE, LUE, RLE, LLE   o strength and tone normal, no atrophy, no abnormal movements Skin:  . No rashes, lesions, ulcers noted . palpation of skin: no induration or nodules Neurologic:  . Grossly normal Psychiatric:  . judgement and insight appear normal . Mental status o Mood, affect appropriate  Wt Readings from Last 3 Encounters:  03/15/16 72.576 kg (160 lb)  03/01/16 70.308 kg (155 lb)  02/20/16 70.761 kg (156 lb)    I have personally reviewed following labs and imaging studies  Labs on Admission:  CBC:  Recent Labs Lab 03/15/16 1143  WBC 2.5*  HGB 10.5*  HCT 30.9*  MCV 99.0  PLT 71*   Basic Metabolic Panel:  Recent Labs Lab 03/15/16 1143  NA 139  K 3.7  CL 105  CO2 29  GLUCOSE 118*  BUN 19  CREATININE 1.20  CALCIUM 8.4*   Cardiac Enzymes:  Recent Labs Lab 03/15/16 1143  TROPONINI 0.54*    Urine analysis:    Component Value Date/Time   COLORURINE BROWN* 03/01/2016 1800   APPEARANCEUR CLOUDY* 03/01/2016 1800   LABSPEC 1.020 03/01/2016 1800   PHURINE 5.0 03/01/2016 1800   GLUCOSEU NEGATIVE 03/01/2016 1800   HGBUR LARGE* 03/01/2016 1800   BILIRUBINUR MODERATE* 03/01/2016 1800   BILIRUBINUR neg 12/28/2013 1142   KETONESUR NEGATIVE 03/01/2016 1800   PROTEINUR 100* 03/01/2016 1800   PROTEINUR neg 12/28/2013 1142   UROBILINOGEN 1.0 12/28/2013 1142   NITRITE NEGATIVE 03/01/2016 1800   NITRITE neg 12/28/2013 1142   LEUKOCYTESUR  MODERATE* 03/01/2016 1800    Radiological Exams on Admission: Dg Chest 2 View  03/15/2016  CLINICAL DATA:  Chest pain and shortness of breath. Dizziness with generalized weakness. EXAM: CHEST  2 VIEW COMPARISON:  02/19/2016 FINDINGS:  The lungs are clear wiithout focal pneumonia, edema, pneumothorax or pleural effusion. Interstitial markings are diffusely coarsened with chronic features. The cardio pericardial silhouette is enlarged. Left-sided pacer again noted. Bones are diffusely demineralized. Mild anterior wedge compression deformity T12 is stable. IMPRESSION: Stable exam.  Cardiomegaly with emphysema. Electronically Signed   By: Misty Stanley M.D.   On: 03/15/2016 12:59   Ct Angio Chest Pe W/cm &/or Wo Cm  03/15/2016  CLINICAL DATA:  82 year old male with acute chest pain and dizziness. EXAM: CT ANGIOGRAPHY CHEST WITH CONTRAST TECHNIQUE: Multidetector CT imaging of the chest was performed using the standard protocol during bolus administration of intravenous contrast. Multiplanar CT image reconstructions and MIPs were obtained to evaluate the vascular anatomy. CONTRAST:  100 cc Isovue 370 COMPARISON:  Chest radiograph dated 03/15/2016 FINDINGS: For Evaluation of this exam is limited due to respiratory motion artifact. There small bilateral pleural effusions, right greater left. There are patchy areas of ground-glass and hazy airspace density bilaterally permanently involving the right lung base compatible with congestive changes and interstitial edema. Pneumonia is not excluded. Clinical correlation is recommended. There is no focal consolidation. No pneumothorax. The central airways are patent. There is atherosclerotic calcification of the thoracic aorta. The aortic arch measures up to 3.5 cm in diameter. Evaluation of the aorta is limited due to non opacification. There is mild dilatation of the main pulmonary trunk indicative of a degree of underlying pulmonary hypertension. Evaluation of the  pulmonary arteries is limited due to respiratory motion artifact and suboptimal opacification of the peripheral branches. No definite central pulmonary artery embolus identified. There is severe cardiomegaly. Small pericardial effusion. There is coronary vascular calcification. There is retrograde flow of contrast from the right atrium into the IVC compatible with right cardiac dysfunction. Correlation with clinical exam and echocardiogram recommended. There is no hilar or mediastinal adenopathy. The esophagus is grossly unremarkable. There is no axillary adenopathy. There is diffuse subcutaneous soft tissue stranding and edema and anasarca. There is osteopenia with degenerative changes of the spine. No acute fracture. There multiple stones within the gallbladder. The visualized upper abdomen is otherwise grossly unremarkable. Review of the MIP images confirms the above findings. IMPRESSION: No CT evidence of central pulmonary artery embolus. Mildly dilated aortic arch. Recommend annual imaging followup by CTA or MRA. This recommendation follows 2010 ACCF/AHA/AATS/ACR/ASA/SCA/SCAI/SIR/STS/SVM Guidelines for the Diagnosis and Management of Patients with Thoracic Aortic Disease. Circulation.2010; 121: [id number] Severe cardiomegaly with evidence of right cardiac dysfunction and small pericardial effusion. Correlation with echocardiogram recommended. Congestive heart failure with small bilateral pleural effusions. No focal consolidation or pneumothorax. Electronically Signed   By: Anner Crete M.D.   On: 03/15/2016 13:15    EKG: Independently reviewed. As above  Principal Problem:   Acute CHF (congestive heart failure) (HCC) Active Problems:   Pancytopenia (HCC)   Biventricular cardiac pacemaker in situ   Alzheimer's dementia   Atrial fibrillation, persistent (HCC)   Pulmonary hypertension (HCC)   Mitral regurgitation   Tricuspid regurgitation   Assessment/Plan 1. Acute congestive heart failure,  valvular and right ventricular, moderate to severe mitral regurgitation, moderate to severe tricuspid regurgitation. LVEF 55-60 percent on echo October 2016. CT evidence of severe cardiomegaly with right cardiac dysfunction. Stop ibuprofen. Hold Norvasc for now. 2. Atypical chest pain. CTA chest no PE. Troponin modestly elevated, chronically elevated and appears to be at baseline. ACS not suspected at this point. 3. Elevated troponin, chronic in nature. Very similar to previous findings 01/06/2015, 12/06/2008. 4. Pulmonary hypertension with  nocturnal hypoxia. Currently mid 90s on room air. 5. Pancytopenia appears to be a baseline. 6. Atrial fibrillation. According to chart intolerant to beta blocker secondary to side effects. Not on anticoagulation secondary to prior GI bleed. Paced rhythm currently. 7. Dementia appears to be at baseline. 8. Status post pacemaker 9. Mildly dilated aortic arch. Annual imaging recommended.   Patient improving with diuresis in the emergency department. Plan observation, continue diuresis. Will repeat troponin but ACS is doubted as above.  Other comorbidities appear to be stable.  DVT prophylaxis: SCDs Code Status: full code Family Communication: wife at bedside Disposition Plan: obs >> return home Consults called:       Time spent: 50 minutes  Murray Hodgkins, MD  [facility] Direct contact: [phone] --[place] [place]  --[email or web address]; password [email or web address]  7PM-7AM contact night coverage as above  03/15/2016, 4:34 PM

## 2016-03-15 NOTE — ED Provider Notes (Signed)
CSN: [id number]     Arrival date & time 03/15/16  1114 History   First MD Initiated Contact with Patient 03/15/16 1131     Chief Complaint  Patient presents with  . Chest Pain     (Consider location/radiation/quality/duration/timing/severity/associated sxs/prior Treatment) HPI Comments: 82 year old male with history of pancytopenia, cystitis, dementia, pacemaker, atrial fibrillation presents with shortness of breath, dizziness and chest pain since Thursday. Chest pain Central nonradiating. Intermittent. No specific association. Patient normally uses oxygen at night however is been more short of breath and normal. Patient has pulmonary hypertension history however no known history of pulmonary embolism. No recent surgeries except cystoscopy for bladder tumor. No leg swelling. Patient denied heart failure however with dementia history he does have heart failure on his problem list.  Patient is a 82 y.o. male presenting with chest pain. The history is provided by the patient, the spouse and medical records.  Chest Pain Associated symptoms: dizziness and shortness of breath   Associated symptoms: no abdominal pain, no back pain, no fever, no headache and not vomiting     Past Medical History  Diagnosis Date  . Chronic systolic heart failure ([place])   . Rash and other nonspecific skin eruption   . Costochondritis, acute   . Glaucoma   . Erectile dysfunction   . Visual changes   . Pancytopenia   . Chronic pancreatitis ([place])     Based on CT findings  . History of melanoma in situ     JAN 2012--  LEFT HEEL W/ SLN BX  . Chronic back pain   . IC (interstitial cystitis)   . BPH (benign prostatic hypertrophy)   . Cardiac pacemaker     CARDIOLOGIST-- DR Cristopher Peru (LAST PACE [facility] 05-15-2013)  . Asymptomatic carotid artery stenosis     BILATERAL MILD ICA---  <50% PER DUPLEX 03-08-2008  . History of syncope   . Hypertension   . CHB (complete heart block) ([place])   . Nonischemic  cardiomyopathy ([place])   . Severe mitral regurgitation   . Urge urinary incontinence   . A-fib ([place])   . Dementia   . Alzheimer disease   . Arthritis   . Cancer ([place])   . Poor historian   . HOH (hard of hearing)    Past Surgical History  Procedure Laterality Date  . Cataract extraction, bilateral  left  1997/   right 2003  . Pacemaker insertion  12-06-2008  DR GREGG TAYLOR    BiV PPM  --  [employer]  . Colonoscopy  08/18/2006    [id number] granularity and erosions of the rectum of uncertain significance biopsied.  A long redundant, but otherwise normal-appearing colon.melanosi coli and minimal inflammation on bx  . Inguinal hernia repair Bilateral AS TEEN  . Interstim implant placement  2001  &  2007  . Cysto/ hod/  replacement interstim implant  05-14-2003  . Removal interstim implant/  cyst/  hod  04-14-2004  . Revision interstim implant and replace generator  05-15-2009    left upper buttock for urge urinary incontinence  . Wide excision left heel and sln bx  JUNE 2012  . Tonsillectomy  1950  . Left elbow surgery  2002  . Back surgery  X3  LAST ONE'90's  . Transthoracic echocardiogram  12-06-2008    LVSF 55-60%/  MODERATE MR/  MILD AR/  QUESTION DISTAL POSTERIOR HYPOKINESIS  . Interstim implant revision N/A 08/28/2013    Procedure: REPLACEMENT OF INTERSTIM-GENERATOR AND LEAD ;  Surgeon: Elayne Snare  MacDiarmid, MD;  Location: [facility];  Service: Urology;  Laterality: N/A;  . Esophagogastroduodenoscopy N/A 03/28/2014    Dr. [name]: normal esophagus s/p dilation, mosaic appearance - chronic inflammation noted on bx  . Savory dilation N/A 03/28/2014    Procedure: SAVORY DILATION;  Surgeon: [name], MD;  [facility];  Service: Endoscopy;  Laterality: N/A;  Venia Minks dilation N/A 03/28/2014    Procedure: Venia Minks DILATION;  Surgeon: [name], MD;  Location: [facility];  Service: Endoscopy;  Laterality: N/A;  . Colonoscopy N/A 08/05/2014    Procedure:  COLONOSCOPY;  Surgeon: [name], MD;  Location[facility];  Service: Endoscopy;  Laterality: N/A;  PT NEEDS TCS AT 1000.  . Implantable cardioverter defibrillator (icd) generator change N/A 10/11/2014    Procedure: ICD GENERATOR CHANGE;  Surgeon: [name], MD;  Location: [facility] CATH LAB;  Service: Cardiovascular;  Laterality: N/A;  . Transurethral resection of bladder tumor with gyrus (turbt-gyrus) N/A 02/11/2016    Procedure: TRANSURETHRAL RESECTION OF BLADDER TUMOR WITH GYRUS (TURBT-GYRUS);  Surgeon: Cleon Gustin, MD;  Location: [facility] ORS;  Service: Urology;  Laterality: N/A;  . Cystoscopy w/ retrogrades N/A 02/11/2016    Procedure: CYSTOSCOPY WITH RETROGRADE PYELOGRAM;  Surgeon: Cleon Gustin, MD;  Location: [facility] ORS;  Service: Urology;  Laterality: N/A;   Family History  Problem Relation Age of Onset  . Dementia Mother   . Throat cancer Father   . Cancer Father 86    throat  . Lung cancer Sister   . Heart disease Brother    Social History  Substance Use Topics  . Smoking status: Former Smoker -- 0.02 packs/day for 30 years    Types: Cigarettes    Quit date: 08/24/1997  . Smokeless tobacco: Never Used  . Alcohol Use: No    Review of Systems  Constitutional: Negative for fever, chills and unexpected weight change.  HENT: Negative for congestion.   Eyes: Negative for visual disturbance.  Respiratory: Positive for shortness of breath.   Cardiovascular: Positive for chest pain. Negative for leg swelling.  Gastrointestinal: Negative for vomiting and abdominal pain.  Genitourinary: Negative for dysuria and flank pain.  Musculoskeletal: Negative for back pain, neck pain and neck stiffness.  Skin: Negative for rash.  Neurological: Positive for dizziness. Negative for light-headedness and headaches.      Allergies  Codeine and Penicillins  Home Medications   Prior to Admission medications   Medication Sig Start Date End Date Taking? Authorizing Provider   amLODipine (NORVASC) 2.5 MG tablet TAKE 1 TABLET (2.5 MG TOTAL) BY MOUTH DAILY. 02/02/16  Yes Fayrene Helper, MD  cefpodoxime (VANTIN) 200 MG tablet Take 200 mg by mouth 2 (two) times daily.  02/18/16  Yes Historical Provider, MD  diclofenac sodium (VOLTAREN) 1 % GEL Apply 2 g topically daily as needed (pain). Reported on 01/05/2016 12/04/15  Yes Historical Provider, MD  fentaNYL (DURAGESIC - DOSED MCG/HR) 25 MCG/HR patch Place 1 patch (25 mcg total) onto the skin every 3 (three) days. 02/27/16  Yes Fayrene Helper, MD  furosemide (LASIX) 20 MG tablet TAKE AS DIRECTED Patient taking differently: Take 20 mg by mouth daily. May take twice if needed. 02/05/16  Yes [name], MD  ibuprofen (ADVIL,MOTRIN) 200 MG tablet Take 400 mg by mouth every 6 (six) hours as needed for moderate pain.   Yes Historical Provider, MD  Meth-Hyo-M Bl-Na Phos-Ph Sal (URIBEL) 118 MG CAPS Take 1  capsule (118 mg total) by mouth 2 (two) times daily as needed (bladder). 02/11/16  Yes Cleon Gustin, MD  polyethylene glycol powder (GLYCOLAX/MIRALAX) powder Take 17 g by mouth daily. 05/16/15  Yes Fayrene Helper, MD  levofloxacin (LEVAQUIN) 500 MG tablet Take 1 tablet (500 mg total) by mouth daily. Patient not taking: Reported on 03/01/2016 02/14/16   Roxanna Mew, PA-C  traMADol (ULTRAM) 50 MG tablet Take 1 tablet (50 mg total) by mouth every 4 (four) hours as needed for moderate pain. Patient not taking: Reported on 03/04/2016 02/11/16   Cleon Gustin, MD   BP 124/93 mmHg  Pulse 68  Temp(Src) 97.9 F (36.6 C) (Oral)  Resp 20  Ht 6' (1.829 m)  Wt 160 lb (72.576 kg)  BMI 21.70 kg/m2  SpO2 96% Physical Exam  Constitutional: He is oriented to person, place, and time. He appears well-developed and well-nourished.  HENT:  Head: Normocephalic and atraumatic.  Eyes: Right eye exhibits no discharge. Left eye exhibits no discharge.  Neck: Normal range of motion. Neck supple. No tracheal deviation present.   Cardiovascular: Normal rate and regular rhythm.   Pulmonary/Chest: Effort normal. He has rales (few crackles lower bilateral).  Abdominal: Soft. He exhibits no distension. There is no tenderness. There is no guarding.  Musculoskeletal: He exhibits no edema.  Neurological: He is alert and oriented to person, place, and time.  Skin: Skin is warm. No rash noted.  Psychiatric:  Mild dementia  Nursing note and vitals reviewed.   ED Course  Procedures (including critical care time) Labs Review Labs Reviewed  BASIC METABOLIC PANEL - Abnormal; Notable for the following:    Glucose, Bld 118 (*)    Calcium 8.4 (*)    GFR calc non Af Amer 53 (*)    All other components within normal limits  CBC - Abnormal; Notable for the following:    WBC 2.5 (*)    RBC 3.12 (*)    Hemoglobin 10.5 (*)    HCT 30.9 (*)    RDW 16.1 (*)    Platelets 71 (*)    All other components within normal limits  TROPONIN I - Abnormal; Notable for the following:    Troponin I 0.54 (*)    All other components within normal limits    Imaging Review Dg Chest 2 View  03/15/2016  CLINICAL DATA:  Chest pain and shortness of breath. Dizziness with generalized weakness. EXAM: CHEST  2 VIEW COMPARISON:  02/19/2016 FINDINGS: The lungs are clear wiithout focal pneumonia, edema, pneumothorax or pleural effusion. Interstitial markings are diffusely coarsened with chronic features. The cardio pericardial silhouette is enlarged. Left-sided pacer again noted. Bones are diffusely demineralized. Mild anterior wedge compression deformity T12 is stable. IMPRESSION: Stable exam.  Cardiomegaly with emphysema. Electronically Signed   By: Misty Stanley M.D.   On: 03/15/2016 12:59   Ct Angio Chest Pe W/cm &/or Wo Cm  03/15/2016  CLINICAL DATA:  82 year old male with acute chest pain and dizziness. EXAM: CT ANGIOGRAPHY CHEST WITH CONTRAST TECHNIQUE: Multidetector CT imaging of the chest was performed using the standard protocol during bolus  administration of intravenous contrast. Multiplanar CT image reconstructions and MIPs were obtained to evaluate the vascular anatomy. CONTRAST:  100 cc Isovue 370 COMPARISON:  Chest radiograph dated 03/15/2016 FINDINGS: For Evaluation of this exam is limited due to respiratory motion artifact. There small bilateral pleural effusions, right greater left. There are patchy areas of ground-glass and hazy airspace density bilaterally permanently involving the  right lung base compatible with congestive changes and interstitial edema. Pneumonia is not excluded. Clinical correlation is recommended. There is no focal consolidation. No pneumothorax. The central airways are patent. There is atherosclerotic calcification of the thoracic aorta. The aortic arch measures up to 3.5 cm in diameter. Evaluation of the aorta is limited due to non opacification. There is mild dilatation of the main pulmonary trunk indicative of a degree of underlying pulmonary hypertension. Evaluation of the pulmonary arteries is limited due to respiratory motion artifact and suboptimal opacification of the peripheral branches. No definite central pulmonary artery embolus identified. There is severe cardiomegaly. Small pericardial effusion. There is coronary vascular calcification. There is retrograde flow of contrast from the right atrium into the IVC compatible with right cardiac dysfunction. Correlation with clinical exam and echocardiogram recommended. There is no hilar or mediastinal adenopathy. The esophagus is grossly unremarkable. There is no axillary adenopathy. There is diffuse subcutaneous soft tissue stranding and edema and anasarca. There is osteopenia with degenerative changes of the spine. No acute fracture. There multiple stones within the gallbladder. The visualized upper abdomen is otherwise grossly unremarkable. Review of the MIP images confirms the above findings. IMPRESSION: No CT evidence of central pulmonary artery embolus. Mildly  dilated aortic arch. Recommend annual imaging followup by CTA or MRA. This recommendation follows 2010 ACCF/AHA/AATS/ACR/ASA/SCA/SCAI/SIR/STS/SVM Guidelines for the Diagnosis and Management of Patients with Thoracic Aortic Disease. Circulation.2010; 121: [id number] Severe cardiomegaly with evidence of right cardiac dysfunction and small pericardial effusion. Correlation with echocardiogram recommended. Congestive heart failure with small bilateral pleural effusions. No focal consolidation or pneumothorax. Electronically Signed   By: Anner Crete M.D.   On: 03/15/2016 13:15   I have personally reviewed and evaluated these images and lab results as part of my medical decision-making.   EKG Interpretation   Date/Time:  Monday March 15 2016 11:22:29 EDT Ventricular Rate:  66 PR Interval:  192 QRS Duration: 139 QT Interval:  449 QTC Calculation: 470 R Axis:   146 Text Interpretation:  Ventricular-paced complexes No further analysis  attempted due to paced rhythm Confirmed by Soledad Budreau MD, Babbette Dalesandro [phone]) on  03/15/2016 11:32:55 AM      MDM   Final diagnoses:  Acute chest pain  Acute dyspnea  Acute CHF  Patient presents with worsening short of breath and intermittent chest pain since Thursday. EKG paced rhythm reviewed. Chest x-ray no acute findings. Patient did have recent procedure performed, no active cancer or other classic blood clot risk factors. Plan for CT PE, delta troponin and tele observation, pain meds given.  CHF, lasix given.  With age, difficulty taking lasix as directed and mild hypoxia plan for tele obs.  The patients results and plan were reviewed and discussed.   Any x-rays performed were independently reviewed by myself.   Differential diagnosis were considered with the presenting HPI.  Medications  fentaNYL (SUBLIMAZE) injection 40 mcg (40 mcg Intravenous Given 03/15/16 1236)  iopamidol (ISOVUE-370) 76 % injection 100 mL (100 mLs Intravenous Contrast Given 03/15/16  1305)  furosemide (LASIX) injection 40 mg (40 mg Intravenous Given 03/15/16 1357)    Filed Vitals:   03/15/16 1230 03/15/16 1400 03/15/16 1430 03/15/16 1500  BP: 119/81 130/95 114/76 124/93  Pulse: 56  57 68  Temp:      TempSrc:      Resp: 24 27 20    Height:      Weight:      SpO2: 93%  88% 96%    Final diagnoses:  Acute chest  pain  Acute dyspnea    Admission/ observation were discussed with the admitting physician, patient and/or family and they are comfortable with the plan.     Elnora Morrison, MD 03/15/16 [phone]

## 2016-03-15 NOTE — ED Notes (Signed)
Hospitalist at the bedside 

## 2016-03-15 NOTE — ED Notes (Signed)
CRITICAL VALUE ALERT  Critical value received:  Troponin 0.54  Date of notification:  03/15/16  Time of notification:  1219  Critical value read back:Yes.    Nurse who received alert:  RCockram   Responding MD:  Reather Converse  Time MD responded:  [phone]

## 2016-03-15 NOTE — ED Notes (Signed)
Pt missed urinal voided on the floor, gown change, pt unsure if wanting to stay in hospital

## 2016-03-15 NOTE — ED Notes (Signed)
MD at the bedside  

## 2016-03-15 NOTE — Telephone Encounter (Signed)
CP started Friday evening, states he doesn't NTG, wife has a bottle of NTG,but has not taken any.States pain is mid chest with SOB which has worsened over weekend.Has back pain but relates to previous back surgeries. States pain is on and off but cannot tell me how long it lasts,denies N/V,radiation ,awakened from sleep several times. Took shower this am and SOB continues,advised to got th [facility] Ed,wife will take.I will FYI Dr.Branch

## 2016-03-15 NOTE — ED Notes (Signed)
Pt reports chest pain,sob,dizziness,generalized weakness since Thursday.

## 2016-03-16 DIAGNOSIS — I509 Heart failure, unspecified: Secondary | ICD-10-CM | POA: Diagnosis not present

## 2016-03-16 LAB — BASIC METABOLIC PANEL
ANION GAP: 6 (ref 5–15)
BUN: 17 mg/dL (ref 6–20)
CO2: 31 mmol/L (ref 22–32)
Calcium: 8.3 mg/dL — ABNORMAL LOW (ref 8.9–10.3)
Chloride: 103 mmol/L (ref 101–111)
Creatinine, Ser: 1 mg/dL (ref 0.61–1.24)
GFR calc Af Amer: >60 mL/min (ref >60)
Glucose, Bld: 91 mg/dL (ref 65–99)
POTASSIUM: 3.2 mmol/L — AB (ref 3.5–5.1)
SODIUM: 140 mmol/L (ref 135–145)

## 2016-03-16 MED ORDER — POTASSIUM CHLORIDE CRYS ER 20 MEQ PO TBCR
40.0000 meq | EXTENDED_RELEASE_TABLET | Freq: Once | ORAL | Status: AC
  Administered 2016-03-16: 40 meq via ORAL
  Filled 2016-03-16: qty 2

## 2016-03-16 MED ORDER — FUROSEMIDE 20 MG PO TABS: 20.0000 mg | ORAL_TABLET | Freq: Every day | ORAL | Status: DC

## 2016-03-16 MED ORDER — ASPIRIN 81 MG PO TBEC: 81.0000 mg | DELAYED_RELEASE_TABLET | Freq: Every day | ORAL | Status: AC

## 2016-03-16 NOTE — Consult Note (Signed)
   [facility] CM Inpatient Consult   03/16/2016  Anatole Sanguino 1926/07/10 [id number]  Patient is currently active with [facility] for chronic disease management services.  Patient has been engaged by a [employer].  Our community based plan of care has focused on disease management and community resource support.  Patient will receive a post discharge transition of care call and will be evaluated for monthly home visits for assessments and disease process education.  Made Inpatient Case Manager aware that [facility] following.  Of note, [facility] Care Management services does not replace or interfere with any services that are arranged by inpatient case management or social work.   For additional questions or referrals please contact:   Royetta Crochet. Laymond Purser, RN, BSN, [facility] [phone]

## 2016-03-16 NOTE — Care Management Obs Status (Signed)
[place] NOTIFICATION   Patient Details  Name: [name] MRN: [id number] Date of Birth: 1926/07/20   Medicare Observation Status Notification Given:  Other (see comment) (pt discharged < 24hrs)    Sherald Barge, RN 03/16/2016, 3:22 PM

## 2016-03-16 NOTE — Progress Notes (Signed)
Patient discharged home with wife.  IV removed - WNL.  Reviewed DC instructions, medications, and HF management at home.  Verbalizes understanding.  No questions at this time.  Instructed to follow up with PCP in 1 week.  Assisted off unit via WC by NT.

## 2016-03-16 NOTE — Care Management Note (Signed)
Case Management Note  Patient Details  Name: [name] MRN: [id number] Date of Birth: 07-05-1926  Subjective/Objective:                  Pt is from home, lives with wife, admitted with CHF. Pt states he is ind with ADL's with use of cane. Pt states he is receiving [place] services through [facility] and being followed by [facility]. Pt plans to return home with resumption of [place] services. Kennyth Arnold, of [facility] aware of admission and DC today, order will be obtained from chart. Pt is aware HH has 48 hours to resume services.   Action/Plan: Discharging today with resumption of [place] services.   Expected Discharge Date:  03/18/16               Expected Discharge Plan:  [place]  In-House Referral:  NA  Discharge planning Services  CM Consult  Post Acute Care Choice:  Home Health, Resumption of Svcs/PTA Provider Choice offered to:  Patient  DME Arranged:    DME Agency:     HH Arranged:  RN, PT, Nurse's Aide [place] Agency:  [place]  Status of Service:  Completed, signed off  Medicare Important Message Given:    Date Medicare IM Given:    Medicare IM give by:    Date Additional Medicare IM Given:    Additional Medicare Important Message give by:     If discussed at [place] of Stay Meetings, dates discussed:    Additional Comments:  Sherald Barge, RN 03/16/2016, 3:32 PM

## 2016-03-16 NOTE — Progress Notes (Signed)
PROGRESS NOTE  [name] [id number] DOB: 05-Sep-1926 DOA: 03/15/2016 PCP: [name], MD  Brief Narrative: 80 yom presented with complaints of chest pain and shortness of breath for several days. Initial evaluation suggested acute heart failure, for which he was treated with lasix and referred for observation.   Assessment/Plan: 1. Acute congestive heart failure, valvular and right ventricular, moderate to severe mitral regurgitation, moderate to severe tricuspid regurgitation. Resolved.  2. Atypical chest pain. Resolved. CTA chest no PE. No evidence of ACS. No further evaluation. 3. Elevated troponin, chronic in nature. Very similar to previous findings 01/06/2015, 12/06/2008. Flat, consistent with previous studies. No signs of ACS. No further evaluation suggested.  4. Pulmonary hypertension with nocturnal hypoxia. Currently mid 90s on room air. 5. Hypokalemia. Will replace. 6. Pancytopenia appears to be a baseline. 7. Atrial fibrillation. According to chart intolerant to beta blocker secondary to side effects. Not on anticoagulation secondary to prior GI bleed. Paced rhythm currently. 8. Dementia appears to be at baseline. 9. Status post pacemaker 10. Mildly dilated aortic arch. Annual imaging recommended.  Appears to be a baseline. Appears euvolemic. Plan discharge home today.  Murray Hodgkins, MD  [facility] Direct contact: [phone] --Via amion app OR  --[email or web address]; password [email or web address]  7PM-7AM contact night coverage as above 03/16/2016, 7:21 AM    Consultants:  none  Procedures:  none  Antimicrobials:  none  HPI/Subjective: Feeling better. Breathing has improved. Denies any chest pain. Per nurse, no issues over night.   Objective: Filed Vitals:   03/15/16 1730 03/15/16 1808 03/15/16 2119 03/16/16 0541  BP: 121/80 135/86 133/85 107/68  Pulse: 62 63 65 63  Temp:  97.3 F (36.3 C) 97.6 F (36.4 C) 97.1 F (36.2 C)  TempSrc:  Oral Oral Oral    Resp: 22 20 20 20   Height:  5\' 11"  (1.803 m)    Weight:  69.264 kg (152 lb 11.2 oz)  66.3 kg (146 lb 2.6 oz)  SpO2: 100% 98% 100% 99%    Intake/Output Summary (Last 24 hours) at 03/16/16 0721 Last data filed at 03/15/16 2138  Gross per 24 hour  Intake    243 ml  Output   3450 ml  Net  -3207 ml     Filed Weights   03/15/16 1124 03/15/16 1808 03/16/16 0541  Weight: 72.576 kg (160 lb) 69.264 kg (152 lb 11.2 oz) 66.3 kg (146 lb 2.6 oz)    Exam:    Constitutional:  . Appears calm and comfortable Respiratory:  . Crackles at bases, otherwise clear . Respiratory effort normal. No retractions or accessory muscle use Cardiovascular:  . 3/6 systolic murmer best heard at back. . Trace right pedal edema. No left lower edema.   . Telemetry shows paced rhythm.  I have personally reviewed following labs and imaging studies:  Potassium 3.2  Basic metabolic panel unremarkable  Troponin 0.6  Scheduled Meds: . aspirin EC  81 mg Oral Daily  . cefpodoxime  200 mg Oral BID  . fentaNYL  25 mcg Transdermal Q72H  . furosemide  40 mg Intravenous BID  . polyethylene glycol  17 g Oral Daily  . sodium chloride flush  3 mL Intravenous Q12H  . sodium chloride flush  3 mL Intravenous Q12H   Continuous Infusions:   Principal Problem:   Acute CHF (congestive heart failure) (HCC) Active Problems:   Pancytopenia (HCC)   Biventricular cardiac pacemaker in situ   Alzheimer's dementia   Atrial fibrillation, persistent ([place])   Pulmonary hypertension ([place])  Mitral regurgitation   Tricuspid regurgitation     Time spent 25 minutes  By signing my name below, I, Delene Ruffini, attest that this documentation has been prepared under the direction and in the presence of Brian Berg P. Sarajane Jews, MD. Electronically Signed: Delene Ruffini, Scribe.  03/16/2016 10:40am   I personally performed the services described in this documentation. All medical record entries made by the scribe were at my  direction. I have reviewed the chart and agree that the record reflects my personal performance and is accurate and complete. Murray Hodgkins, MD

## 2016-03-16 NOTE — Discharge Summary (Signed)
Physician Discharge Summary  [name] [id number] DOB: 04/10/1926 DOA: 03/15/2016  PCP: [name], MD  Admit date: 03/15/2016 Discharge date: 03/16/2016  Recommendations for Outpatient Follow-up:  1. Follow up with PCP in 1-2 weeks, Follow-up CHF 2. Mildly dilated aortic arch. Annual imaging recommended.  Follow-up Information    Follow up with [name], MD. Schedule an appointment as soon as possible for a visit in 1 week.   Specialty:  Family Medicine   Why:  Office closed - called at 11:11am - please call to schedule as soon as possible to schedule   Contact information:   [address], [place] [address] [phone]      Discharge Diagnoses:  1. Acute congestive heart failureSecondary to valvular disease and right ventricular dysfunction. 2. Moderate to severe mitral regurgitation. 3. Moderate to severe tricuspid regurgitation. 4. Atypical chest pain 5. Elevated troponin, chronic 6. Pulmonary hypertension 7. Pancytopenia   Discharge Condition: improved Disposition: Discharge home  Diet recommendation: heart healthy  Filed Weights   03/15/16 1124 03/15/16 1808 03/16/16 0541  Weight: 72.576 kg (160 lb) 69.264 kg (152 lb 11.2 oz) 66.3 kg (146 lb 2.6 oz)    History of present illness:  82 year old man presented to the emergency department with several day history of chest pain, shortness of breath. Initial evaluation suggested acute heart failure, treated with Lasix and referred for observation.  Hospital Course:  Observed overnight, treated with IV Lasix with excellent diuresis. No further chest pain which was atypical in nature. Modest elevation of troponin is chronic as seen multiple times in the past. Hospitalization was uncomplicated. Will have patient follow up with primary care physician.  1. Acute congestive heart failure, valvular and right ventricular, moderate to severe mitral regurgitation, moderate to severe tricuspid  regurgitation. Resolved.  2. Atypical chest pain. Resolved. CTA chest no PE. No evidence of ACS. No further evaluation. 3. Elevated troponin, chronic in nature. Very similar to previous findings 01/06/2015, 12/06/2008. Flat, consistent with previous studies. No signs of ACS. No further evaluation suggested.  4. Pulmonary hypertension with nocturnal hypoxia. Currently mid 90s on room air. 5. Hypokalemia. Will replace. 6. Pancytopenia appears to be a baseline. 7. Atrial fibrillation. According to chart intolerant to beta blocker secondary to side effects. Not on anticoagulation secondary to prior GI bleed. Paced rhythm currently. 8. Dementia appears to be at baseline. 9. Status post pacemaker 10. Mildly dilated aortic arch. Annual imaging recommended.  Discharge Instructions  Discharge Instructions    Diet - low sodium heart healthy    Complete by:  As directed      Discharge instructions    Complete by:  As directed   Call your physician or seek immediate medical attention for chest pain, shortness of breath, swelling or worsening of condition.     Increase activity slowly    Complete by:  As directed           Discharge Medication List as of 03/16/2016 12:28 PM    START taking these medications   Details  aspirin EC 81 MG EC tablet Take 1 tablet (81 mg total) by mouth daily., Starting 03/16/2016, Until Discontinued, No Print      CONTINUE these medications which have CHANGED   Details  furosemide (LASIX) 20 MG tablet Take 1 tablet (20 mg total) by mouth daily. May take twice if needed., Starting 03/16/2016, Until Discontinued, No Print      CONTINUE these medications which have NOT CHANGED   Details  amLODipine (NORVASC) 2.5 MG tablet TAKE 1 TABLET (2.5 MG TOTAL) BY MOUTH DAILY., Normal    cefpodoxime (VANTIN) 200 MG tablet Take 200 mg by mouth 2 (two) times daily. , Starting 02/18/2016, Until Discontinued, Historical Med    diclofenac sodium (VOLTAREN) 1 % GEL Apply 2 g  topically daily as needed (pain). Reported on 01/05/2016, Starting 12/04/2015, Until Discontinued, Historical Med    fentaNYL (DURAGESIC - DOSED MCG/HR) 25 MCG/HR patch Place 1 patch (25 mcg total) onto the skin every 3 (three) days., Starting 02/27/2016, Until Discontinued, Print    ibuprofen (ADVIL,MOTRIN) 200 MG tablet Take 400 mg by mouth every 6 (six) hours as needed for moderate pain., Until Discontinued, Historical Med    Meth-Hyo-M Bl-Na Phos-Ph Sal (URIBEL) 118 MG CAPS Take 1 capsule (118 mg total) by mouth 2 (two) times daily as needed (bladder)., Starting 02/11/2016, Until Discontinued, Print    polyethylene glycol powder (GLYCOLAX/MIRALAX) powder Take 17 g by mouth daily., Starting 05/16/2015, Until Discontinued, Normal      STOP taking these medications     levofloxacin (LEVAQUIN) 500 MG tablet      traMADol (ULTRAM) 50 MG tablet        Allergies  Allergen Reactions  . Codeine Diarrhea  . Penicillins     Other reaction(s): OTHER  nHas patient had a PCN reaction causing immediate rash, facial/tongue/throat swelling, SOB or lightheadedness with hypotension: Nono Has patient had a PCN reaction causing severe rash involving mucus membranes or skin necrosis: Nono Has patient had a PCN reaction that required hospitalization Nono Has patient had a PCN reaction occurring within the last 10 years: Nono If all of the above answers are "NO", then may    The results of significant diagnostics from this hospitalization (including imaging, microbiology, ancillary and laboratory) are listed below for reference.    Significant Diagnostic Studies: Dg Chest 2 View  03/15/2016  CLINICAL DATA:  Chest pain and shortness of breath. Dizziness with generalized weakness. EXAM: CHEST  2 VIEW COMPARISON:  02/19/2016 FINDINGS: The lungs are clear wiithout focal pneumonia, edema, pneumothorax or pleural effusion. Interstitial markings are diffusely coarsened with chronic features. The cardio pericardial  silhouette is enlarged. Left-sided pacer again noted. Bones are diffusely demineralized. Mild anterior wedge compression deformity T12 is stable. IMPRESSION: Stable exam.  Cardiomegaly with emphysema. Electronically Signed   By: Misty Stanley M.D.   On: 03/15/2016 12:59    Ct Angio Chest Pe W/cm &/or Wo Cm  03/15/2016  CLINICAL DATA:  82 year old male with acute chest pain and dizziness. EXAM: CT ANGIOGRAPHY CHEST WITH CONTRAST TECHNIQUE: Multidetector CT imaging of the chest was performed using the standard protocol during bolus administration of intravenous contrast. Multiplanar CT image reconstructions and MIPs were obtained to evaluate the vascular anatomy. CONTRAST:  100 cc Isovue 370 COMPARISON:  Chest radiograph dated 03/15/2016 FINDINGS: For Evaluation of this exam is limited due to respiratory motion artifact. There small bilateral pleural effusions, right greater left. There are patchy areas of ground-glass and hazy airspace density bilaterally permanently involving the right lung base compatible with congestive changes and interstitial edema. Pneumonia is not excluded. Clinical correlation is recommended. There is no focal consolidation. No pneumothorax. The central airways are patent. There is atherosclerotic calcification of the thoracic aorta. The aortic arch measures up to 3.5 cm in diameter. Evaluation of the aorta is limited due to non opacification. There is mild dilatation of the main pulmonary trunk indicative of a degree of underlying pulmonary hypertension. Evaluation of the  pulmonary arteries is limited due to respiratory motion artifact and suboptimal opacification of the peripheral branches. No definite central pulmonary artery embolus identified. There is severe cardiomegaly. Small pericardial effusion. There is coronary vascular calcification. There is retrograde flow of contrast from the right atrium into the IVC compatible with right cardiac dysfunction. Correlation with clinical exam  and echocardiogram recommended. There is no hilar or mediastinal adenopathy. The esophagus is grossly unremarkable. There is no axillary adenopathy. There is diffuse subcutaneous soft tissue stranding and edema and anasarca. There is osteopenia with degenerative changes of the spine. No acute fracture. There multiple stones within the gallbladder. The visualized upper abdomen is otherwise grossly unremarkable. Review of the MIP images confirms the above findings. IMPRESSION: No CT evidence of central pulmonary artery embolus. Mildly dilated aortic arch. Recommend annual imaging followup by CTA or MRA. This recommendation follows 2010 ACCF/AHA/AATS/ACR/ASA/SCA/SCAI/SIR/STS/SVM Guidelines for the Diagnosis and Management of Patients with Thoracic Aortic Disease. Circulation.2010; 121: [id number] Severe cardiomegaly with evidence of right cardiac dysfunction and small pericardial effusion. Correlation with echocardiogram recommended. Congestive heart failure with small bilateral pleural effusions. No focal consolidation or pneumothorax. Electronically Signed   By: Anner Crete M.D.   On: 03/15/2016 13:15      Labs: Basic Metabolic Panel:  Recent Labs Lab 03/15/16 1143 03/16/16 0400  NA 139 140  K 3.7 3.2*  CL 105 103  CO2 29 31  GLUCOSE 118* 91  BUN 19 17  CREATININE 1.20 1.00  CALCIUM 8.4* 8.3*   CBC:  Recent Labs Lab 03/15/16 1143  WBC 2.5*  HGB 10.5*  HCT 30.9*  MCV 99.0  PLT 71*   Cardiac Enzymes:  Recent Labs Lab 03/15/16 1143 03/15/16 1845  TROPONINI 0.54* 0.60*      Principal Problem:   Acute CHF (congestive heart failure) (HCC) Active Problems:   Pancytopenia (HCC)   Biventricular cardiac pacemaker in situ   Alzheimer's dementia   Atrial fibrillation, persistent (HCC)   Pulmonary hypertension (HCC)   Mitral regurgitation   Tricuspid regurgitation   Time coordinating discharge: 35 minutes  Signed:  Murray Hodgkins, MD [facility] 03/16/2016, 5:51  PM  By signing my name below, I, Delene Ruffini, attest that this documentation has been prepared under the direction and in the presence of Daniel P. Sarajane Jews, MD. Electronically Signed: Delene Ruffini, Scribe.  03/16/2016 10:40am  I personally performed the services described in this documentation. All medical record entries made by the scribe were at my direction. I have reviewed the chart and agree that the record reflects my personal performance and is accurate and complete. Murray Hodgkins, MD

## 2016-03-17 ENCOUNTER — Ambulatory Visit (INDEPENDENT_AMBULATORY_CARE_PROVIDER_SITE_OTHER): Payer: Medicare Other | Admitting: Urology

## 2016-03-17 ENCOUNTER — Other Ambulatory Visit: Payer: Self-pay | Admitting: *Deleted

## 2016-03-17 DIAGNOSIS — R300 Dysuria: Secondary | ICD-10-CM | POA: Diagnosis not present

## 2016-03-17 DIAGNOSIS — N411 Chronic prostatitis: Secondary | ICD-10-CM

## 2016-03-17 DIAGNOSIS — I5022 Chronic systolic (congestive) heart failure: Secondary | ICD-10-CM | POA: Diagnosis not present

## 2016-03-17 DIAGNOSIS — I110 Hypertensive heart disease with heart failure: Secondary | ICD-10-CM | POA: Diagnosis not present

## 2016-03-17 NOTE — Patient Outreach (Signed)
Attempt made to contact pt, f/u on 6/12-6/13 ED visit, observation stay at [facility] to leave a voice message as phone kept ringing.  Plan to try again.      [name].   [facility]  [phone]

## 2016-03-18 ENCOUNTER — Other Ambulatory Visit: Payer: Self-pay | Admitting: *Deleted

## 2016-03-18 ENCOUNTER — Telehealth: Payer: Self-pay | Admitting: Family Medicine

## 2016-03-18 NOTE — Patient Outreach (Addendum)
Follow up phone call from recent ED/observation visit 6/12-6/13.   Spoke with spouse who reports pt has had  no recurrent chest pain since discharge,pt informed had  a lots of fluid on admit to ED.  Spouse reports someone came yesterday and weighed pt (after eating),result 151 lbs, weight down. Spouse reports pt was told not to have salt.     Spouse reports pt  f/u with urologist yesterday, everything is fine.  Spouse reports social worker came yesterday, talked to her about caring for herself.  RN CM discussed with spouse pt's discharge medications (spouse manages), reports was told pt  to take a baby aspirin daily,went to country store, did not have it so just cut a regular aspirin in half.   RN CM informed spouse a half of regular aspirin is not the same as a baby aspirin, double the dosage to which spouse voiced understanding- to get the baby aspirin today.  Also discussed with spouse importance of pt  f/u with Primary Care MD per discharge summary- in one week to which spouse said she would call.      Patient was recently discharged from hospital and all medications have been reviewed-  Done 6/15.   Plan to f/u with pt again 7/5- home visit.  [name].   [facility]  [phone]

## 2016-03-18 NOTE — Telephone Encounter (Signed)
OPENED IN ERROR

## 2016-03-19 DIAGNOSIS — I110 Hypertensive heart disease with heart failure: Secondary | ICD-10-CM | POA: Diagnosis not present

## 2016-03-19 DIAGNOSIS — I5022 Chronic systolic (congestive) heart failure: Secondary | ICD-10-CM | POA: Diagnosis not present

## 2016-03-22 DIAGNOSIS — I5022 Chronic systolic (congestive) heart failure: Secondary | ICD-10-CM | POA: Diagnosis not present

## 2016-03-22 DIAGNOSIS — I110 Hypertensive heart disease with heart failure: Secondary | ICD-10-CM | POA: Diagnosis not present

## 2016-03-24 DIAGNOSIS — I5022 Chronic systolic (congestive) heart failure: Secondary | ICD-10-CM | POA: Diagnosis not present

## 2016-03-24 DIAGNOSIS — I110 Hypertensive heart disease with heart failure: Secondary | ICD-10-CM | POA: Diagnosis not present

## 2016-03-25 ENCOUNTER — Encounter: Payer: Self-pay | Admitting: Family Medicine

## 2016-03-30 ENCOUNTER — Ambulatory Visit (HOSPITAL_COMMUNITY): Payer: Self-pay | Admitting: Oncology

## 2016-03-30 ENCOUNTER — Other Ambulatory Visit (HOSPITAL_COMMUNITY): Payer: Self-pay

## 2016-03-30 DIAGNOSIS — H04123 Dry eye syndrome of bilateral lacrimal glands: Secondary | ICD-10-CM | POA: Diagnosis not present

## 2016-03-30 DIAGNOSIS — Z961 Presence of intraocular lens: Secondary | ICD-10-CM | POA: Diagnosis not present

## 2016-03-30 DIAGNOSIS — H401112 Primary open-angle glaucoma, right eye, moderate stage: Secondary | ICD-10-CM | POA: Diagnosis not present

## 2016-03-30 DIAGNOSIS — H401123 Primary open-angle glaucoma, left eye, severe stage: Secondary | ICD-10-CM | POA: Diagnosis not present

## 2016-03-30 NOTE — Progress Notes (Signed)
No show

## 2016-04-05 ENCOUNTER — Encounter: Payer: Self-pay | Admitting: Family Medicine

## 2016-04-05 ENCOUNTER — Ambulatory Visit (INDEPENDENT_AMBULATORY_CARE_PROVIDER_SITE_OTHER): Payer: Medicare Other | Admitting: Family Medicine

## 2016-04-05 VITALS — BP 130/80 | HR 65 | Resp 16 | Ht 71.0 in | Wt 163.1 lb

## 2016-04-05 DIAGNOSIS — F0280 Dementia in other diseases classified elsewhere without behavioral disturbance: Secondary | ICD-10-CM

## 2016-04-05 DIAGNOSIS — F05 Delirium due to known physiological condition: Secondary | ICD-10-CM | POA: Diagnosis not present

## 2016-04-05 DIAGNOSIS — I255 Ischemic cardiomyopathy: Secondary | ICD-10-CM | POA: Diagnosis not present

## 2016-04-05 DIAGNOSIS — I10 Essential (primary) hypertension: Secondary | ICD-10-CM

## 2016-04-05 DIAGNOSIS — L819 Disorder of pigmentation, unspecified: Secondary | ICD-10-CM | POA: Diagnosis not present

## 2016-04-05 DIAGNOSIS — F411 Generalized anxiety disorder: Secondary | ICD-10-CM

## 2016-04-05 DIAGNOSIS — G309 Alzheimer's disease, unspecified: Secondary | ICD-10-CM

## 2016-04-05 DIAGNOSIS — Z09 Encounter for follow-up examination after completed treatment for conditions other than malignant neoplasm: Secondary | ICD-10-CM

## 2016-04-05 DIAGNOSIS — N3001 Acute cystitis with hematuria: Secondary | ICD-10-CM

## 2016-04-05 DIAGNOSIS — M549 Dorsalgia, unspecified: Secondary | ICD-10-CM

## 2016-04-05 DIAGNOSIS — G8929 Other chronic pain: Secondary | ICD-10-CM

## 2016-04-05 MED ORDER — FUROSEMIDE 20 MG PO TABS: 20.0000 mg | ORAL_TABLET | Freq: Every day | ORAL | Status: DC

## 2016-04-05 MED ORDER — BUSPIRONE HCL 5 MG PO TABS: ORAL_TABLET | ORAL | Status: DC

## 2016-04-05 NOTE — Patient Instructions (Addendum)
F/u with [facility] in 5 weeks, call if you need me sooner  NEED to take all medication EVERY day as prescribed  Labs today, cbc and diff , chem 7 and UA with reflex testing due to 2 day h/o confusion  New for anxiety is buspar one daily  You are referred to Dr Nevada Crane to evaluate skin lesions, under fingernail and on chest

## 2016-04-06 LAB — CBC WITH DIFFERENTIAL/PLATELET
BASOS ABS: 0 {cells}/uL (ref 0–200)
BASOS PCT: 0 %
EOS ABS: 54 {cells}/uL (ref 15–500)
Eosinophils Relative: 2 %
HCT: 29.5 % — ABNORMAL LOW (ref 38.5–50.0)
HEMOGLOBIN: 9.6 g/dL — AB (ref 13.2–17.1)
LYMPHS PCT: 11 %
Lymphs Abs: 297 cells/uL — ABNORMAL LOW (ref 850–3900)
MCH: 32.2 pg (ref 27.0–33.0)
MCHC: 32.5 g/dL (ref 32.0–36.0)
MCV: 99 fL (ref 80.0–100.0)
MONO ABS: 270 {cells}/uL (ref 200–950)
MONOS PCT: 10 %
MPV: 11.9 fL (ref 7.5–12.5)
NEUTROS PCT: 77 %
Neutro Abs: 2079 cells/uL (ref 1500–7800)
PLATELETS: 90 10*3/uL — AB (ref 140–400)
RBC: 2.98 MIL/uL — ABNORMAL LOW (ref 4.20–5.80)
RDW: 15.5 % — AB (ref 11.0–15.0)
WBC: 2.7 10*3/uL — ABNORMAL LOW (ref 3.8–10.8)

## 2016-04-06 LAB — URINALYSIS, ROUTINE W REFLEX MICROSCOPIC
BILIRUBIN URINE: NEGATIVE
GLUCOSE, UA: NEGATIVE
Ketones, ur: NEGATIVE
Nitrite: NEGATIVE
SPECIFIC GRAVITY, URINE: 1.016 (ref 1.001–1.035)
pH: 6 (ref 5.0–8.0)

## 2016-04-06 LAB — URINALYSIS, MICROSCOPIC ONLY
CRYSTALS: NONE SEEN [HPF]
Casts: NONE SEEN [LPF]
SQUAMOUS EPITHELIAL / LPF: NONE SEEN [HPF] (ref <=5)
WBC, UA: >60 WBC/HPF — AB (ref <=5)
YEAST: NONE SEEN [HPF]

## 2016-04-06 LAB — BASIC METABOLIC PANEL
BUN: 27 mg/dL — ABNORMAL HIGH (ref 7–25)
CO2: 26 mmol/L (ref 20–31)
Calcium: 8.5 mg/dL — ABNORMAL LOW (ref 8.6–10.3)
Chloride: 108 mmol/L (ref 98–110)
Creat: 1.01 mg/dL (ref 0.70–1.11)
GLUCOSE: 95 mg/dL (ref 65–99)
Potassium: 3.7 mmol/L (ref 3.5–5.3)
Sodium: 146 mmol/L (ref 135–146)

## 2016-04-07 ENCOUNTER — Other Ambulatory Visit: Payer: Self-pay | Admitting: *Deleted

## 2016-04-07 NOTE — Patient Outreach (Addendum)
[facility]) Care Management   04/07/2016  [name] 13-Oct-1925 [id number]  [name] is an 82 y.o. male  Subjective:  Pt reports feels bad today, up during the night urinating.  Spouse reports pt went to a family   Outing yesterday, did not take his Lasix in am so gave him 2 pills at night ( treat edema).  Spouse reports pt has  Been confused for the past 3 days, not been using his oxygen at night for past few days to which pt reported  Hurts his nose.    Spouse reports pt is weighing at least 3 times a week.   Spouse reports on recent MD visit,  Anxiety medication was prescribed to which pt said helps.    Objective:   Filed Vitals:   04/07/16 1039  BP: 120/80  Pulse: 63  Resp: 20    ROS  Physical Exam  Constitutional: He is oriented to person, place, and time. He appears well-developed and well-nourished.  Cardiovascular: Regular rhythm and normal heart sounds.   Respiratory: Effort normal and breath sounds normal.  GI: Soft. Bowel sounds are normal.  Musculoskeletal: Normal range of motion. He exhibits edema.  Trace edema noted top of left foot. +2 edema top of right foot/ankles.   Neurological: He is alert and oriented to person, place, and time.  Skin: Skin is warm and dry.  Bruising noted on left arm.  Left hand- bottom portion of  nail on index finger blue  Bruised spot - upper  mid chest area.    Psychiatric: He has a normal mood and affect. His behavior is normal. Judgment and thought content normal.    Encounter Medications:   Outpatient Encounter Prescriptions as of 04/07/2016  Medication Sig Note  . amLODipine (NORVASC) 2.5 MG tablet TAKE 1 TABLET (2.5 MG TOTAL) BY MOUTH DAILY.   . busPIRone (BUSPAR) 5 MG tablet One tablet once daily for anxiety   . Carboxymethylcellul-Glycerin 0.5-0.9 % SOLN Apply to eye daily. One drop both eyes   . diclofenac sodium (VOLTAREN) 1 % GEL Apply 2 g topically daily as needed (pain). Reported on 01/05/2016  03/18/2016: As needed.   . dorzolamide (TRUSOPT) 2 % ophthalmic solution 1 drop daily. One drop both eyes   . fentaNYL (DURAGESIC - DOSED MCG/HR) 25 MCG/HR patch Place 1 patch (25 mcg total) onto the skin every 3 (three) days.   . furosemide (LASIX) 20 MG tablet Take 1 tablet (20 mg total) by mouth daily. May take twice if needed.   Marland Kitchen ibuprofen (ADVIL,MOTRIN) 200 MG tablet Take 400 mg by mouth every 6 (six) hours as needed for moderate pain. 03/18/2016: As needed.   . latanoprost (XALATAN) 0.005 % ophthalmic solution 1 drop at bedtime. Both eyes   . Meth-Hyo-M Bl-Na Phos-Ph Sal (URIBEL) 118 MG CAPS Take 1 capsule (118 mg total) by mouth 2 (two) times daily as needed (bladder).   . polyethylene glycol powder (GLYCOLAX/MIRALAX) powder Take 17 g by mouth daily. 04/07/2016: As needed.   Marland Kitchen aspirin EC 81 MG EC tablet Take 1 tablet (81 mg total) by mouth daily. (Patient not taking: Reported on 04/07/2016) 04/07/2016: Spouse to get, start giving to pt.   . cefpodoxime (VANTIN) 200 MG tablet Take 200 mg by mouth 2 (two) times daily. Reported on 04/07/2016 02/19/2016: Received from: External Pharmacy   No facility-administered encounter medications on file as of 04/07/2016.    Functional Status:   In your present state of health, do you have  any difficulty performing the following activities: 03/15/2016 02/06/2016  Hearing? N Y  Vision? N Y  Difficulty concentrating or making decisions? [name]  Walking or climbing stairs? Y N  Dressing or bathing? N N  Doing errands, shopping? N N  Using the Toilet? - -  In the past six months, have you accidently leaked urine? - -  Do you have problems with loss of bowel control? - -  Managing your Medications? - -  Managing your Finances? - -  Housekeeping or managing your Housekeeping? - -    Fall/Depression Screening:    PHQ 2/9 Scores 01/05/2016 11/25/2015 04/11/2015 01/14/2015 01/14/2015 04/25/2013  PHQ - 2 Score 1 1 0 0 1 0    Assessment:  82 year old gentleman, resides with  spouse,appeared confused at times during home visit.                         HF- pt reported weight yesterday 156 lbs., pt weighed during home visit- result 156.6 lbs.                               + 2 edema noted top right foot/ankle, trace top of left foot.  Lungs clear.  Reinforced with                                Spouse use of extra Lasix for pt  as needed for edema, but space it to am and pm.                          Skin integrity- bruising noted on left index finger nail.  Spouse waiting to hear from Dr. Quincy Carnes- referred by Dr. Moshe Cipro.   Plan:  Pt to continue to weigh, record, as ordered by MD spouse to give pt extra Lasix for edema.            Pt to f/u with Heart MD 7/7.            Pt to be compliant with oxygen at night, spouse to clean cannula, if still bothers pt to call                 Oxygen supplier.            Plan to send by in basket to Dr. Moshe Cipro- quarterly update letter, 7/5 home visit encounter.             Plan to continue to provide pt with  community nurse case services, next home visit 8/9.                [facility] CM Care Plan Problem One        Most Recent Value   Care Plan Problem One  Better management of HF related to hx of dementia, compliance with medicaition   Role Documenting the Problem One  Care Management Coordinator   Care Plan for Problem One  Active   THN Long Term Goal (31-90 days)  Spouse would assist pt to better manage HF in the next 60 days    [facility]N Long Term Goal Start Date  03/04/16  Interventions for Problem One Long Term Goal  Reviewed with spouse pt's compliance with Low Na+ diet    [facility] CM Short Term Goal #2 (0-30 days)  Pt's sob would improve after doing O2 at night for the next 30 days    THN CM Short Term Goal #2 Start Date  04/07/16   [facility] CM Short Term Goal #2 Met Date  -- [not assessed in time frame, re established. ]   Interventions for Short Term Goal #2  Reinforced with pt to be compliant with wearing  O2 at night.     THN CM Short Term Goal #4 (0-30 days)  Pt would weigh 1-2 times a week for the next 30 days    THN CM Short Term Goal #4 Start Date  03/18/16   [facility] CM Short Term Goal #4 Met Date  04/07/16 [spouse - pt weighing 3 times a week. spouse recorded today ]    THN CM Care Plan Problem Two        Most Recent Value   Care Plan Problem Two  Issues with anxiety    Role Documenting the Problem Two  Care Management Coordinator   Care Plan for Problem Two  Active   THN CM Short Term Goal #1 (0-30 days)  Anxiety issues would improve in the next 30 days    THN CM Short Term Goal #1 Start Date  04/07/16   Interventions for Short Term Goal #2   discussed with pt to continue to take Buspar as directed, give it time to work.         [name].   [facility]  [phone]

## 2016-04-08 ENCOUNTER — Encounter: Payer: Self-pay | Admitting: *Deleted

## 2016-04-09 ENCOUNTER — Encounter: Payer: Self-pay | Admitting: Adult Health

## 2016-04-09 ENCOUNTER — Ambulatory Visit (INDEPENDENT_AMBULATORY_CARE_PROVIDER_SITE_OTHER): Payer: Medicare Other | Admitting: Adult Health

## 2016-04-09 ENCOUNTER — Other Ambulatory Visit (HOSPITAL_COMMUNITY)
Admission: RE | Admit: 2016-04-09 | Discharge: 2016-04-09 | Disposition: A | Discharge status: Home / Self Care | Payer: Medicare Other | Source: Other Acute Inpatient Hospital | Attending: Orthopedic Surgery | Admitting: Orthopedic Surgery

## 2016-04-09 VITALS — BP 132/78 | HR 74 | Ht 67.0 in | Wt 158.0 lb

## 2016-04-09 DIAGNOSIS — I10 Essential (primary) hypertension: Secondary | ICD-10-CM | POA: Diagnosis not present

## 2016-04-09 DIAGNOSIS — I481 Persistent atrial fibrillation: Secondary | ICD-10-CM

## 2016-04-09 DIAGNOSIS — I5032 Chronic diastolic (congestive) heart failure: Secondary | ICD-10-CM | POA: Diagnosis not present

## 2016-04-09 DIAGNOSIS — I4819 Other persistent atrial fibrillation: Secondary | ICD-10-CM

## 2016-04-09 DIAGNOSIS — I255 Ischemic cardiomyopathy: Secondary | ICD-10-CM

## 2016-04-09 DIAGNOSIS — N3001 Acute cystitis with hematuria: Secondary | ICD-10-CM | POA: Insufficient documentation

## 2016-04-09 LAB — BASIC METABOLIC PANEL
ANION GAP: 6 (ref 5–15)
BUN: 30 mg/dL — ABNORMAL HIGH (ref 6–20)
CHLORIDE: 109 mmol/L (ref 101–111)
CO2: 27 mmol/L (ref 22–32)
Calcium: 8.4 mg/dL — ABNORMAL LOW (ref 8.9–10.3)
Creatinine, Ser: 1.13 mg/dL (ref 0.61–1.24)
GFR calc non Af Amer: 56 mL/min — ABNORMAL LOW (ref >60)
Glucose, Bld: 101 mg/dL — ABNORMAL HIGH (ref 65–99)
POTASSIUM: 3.2 mmol/L — AB (ref 3.5–5.1)
SODIUM: 142 mmol/L (ref 135–145)

## 2016-04-09 MED ORDER — FUROSEMIDE 20 MG PO TABS: 20.0000 mg | ORAL_TABLET | Freq: Two times a day (BID) | ORAL | Status: DC

## 2016-04-09 NOTE — Patient Instructions (Addendum)
Your physician wants you to follow-up in: 6 Months with Dr. Harl Bowie.  You will receive a reminder letter in the mail two months in advance. If you don't receive a letter, please call our office to schedule the follow-up appointment.  Your physician has recommended you make the following change in your medication:   Increase Lasix to 20 mg Two Times Daily  Use Oxygen at Night   If you need a refill on your cardiac medications before your next appointment, please call your pharmacy.  Thank you for choosing [facility]!

## 2016-04-09 NOTE — Progress Notes (Signed)
Name: [name]    DOB: [birth year]  Age: 82 y.o.  MR#: [id number]       PCP:  [name], MD      Insurance: Payor: MEDICARE / Plan: MEDICARE PART A AND B / Product Type: *No Product type* /   CC:   No chief complaint on file.   VS Filed Vitals:   04/09/16 1437  BP: 132/78  Pulse: 74  Height: 5\' 7"  (1.702 m)  Weight: 158 lb (71.668 kg)  SpO2: 91%    Weights Current Weight  04/09/16 158 lb (71.668 kg)  04/07/16 156 lb 9.6 oz (71.033 kg)  04/05/16 163 lb 1.9 oz (73.991 kg)    Blood Pressure  BP Readings from Last 3 Encounters:  04/09/16 132/78  04/07/16 120/80  04/05/16 130/80     Admit date:  (Not on file) Last encounter with RMR:  12/15/2015   Allergy Codeine and Penicillins  Current Outpatient Prescriptions  Medication Sig Dispense Refill  . amLODipine (NORVASC) 2.5 MG tablet TAKE 1 TABLET (2.5 MG TOTAL) BY MOUTH DAILY. 30 tablet 3  . aspirin EC 81 MG EC tablet Take 1 tablet (81 mg total) by mouth daily.    . busPIRone (BUSPAR) 5 MG tablet One tablet once daily for anxiety 30 tablet 2  . Carboxymethylcellul-Glycerin 0.5-0.9 % SOLN Apply to eye daily. One drop both eyes    . diclofenac sodium (VOLTAREN) 1 % GEL Apply 2 g topically daily as needed (pain). Reported on 01/05/2016    . dorzolamide (TRUSOPT) 2 % ophthalmic solution 1 drop daily. One drop both eyes    . fentaNYL (DURAGESIC - DOSED MCG/HR) 25 MCG/HR patch Place 1 patch (25 mcg total) onto the skin every 3 (three) days. 10 patch 0  . furosemide (LASIX) 20 MG tablet Take 1 tablet (20 mg total) by mouth daily. May take twice if needed. 30 tablet 2  . ibuprofen (ADVIL,MOTRIN) 200 MG tablet Take 400 mg by mouth every 6 (six) hours as needed for moderate pain.    Marland Kitchen latanoprost (XALATAN) 0.005 % ophthalmic solution 1 drop at bedtime. Both eyes    . Meth-Hyo-M Bl-Na Phos-Ph Sal (URIBEL) 118 MG CAPS Take 1 capsule (118 mg total) by mouth 2 (two) times daily as needed (bladder). 30 capsule 0  . polyethylene glycol  powder (GLYCOLAX/MIRALAX) powder Take 17 g by mouth daily. 3350 g 1   No current facility-administered medications for this visit.    Discontinued Meds:    Medications Discontinued During This Encounter  Medication Reason  . cefpodoxime (VANTIN) 200 MG tablet Error    Patient Active Problem List   Diagnosis Date Noted  . Generalized anxiety disorder 04/05/2016  . Pigmented skin lesion 04/05/2016  . Acute CHF (congestive heart failure) ([place]) 03/15/2016  . Mitral regurgitation 03/15/2016  . Tricuspid regurgitation 03/15/2016  . Pulmonary hypertension ([place]) 10/01/2015  . Fatigue 10/01/2015  . Chronic back pain 10/01/2015  . Myelodysplasia 06/20/2015  . Dyspnea 02/09/2015  . Recurrent falls 08/01/2014  . Atrial fibrillation, persistent ([place]) 02/22/2014  . Bilateral leg weakness 05/01/2013  . Abnormal gait 06/12/2012  . GERD (gastroesophageal reflux disease) 05/09/2012  . Alzheimer's dementia 11/17/2011  . Backache 02/18/2010  . GEN OSTEOARTHROSIS INVOLVING MULTIPLE SITES 11/25/2009  . CENTRAL HEARING LOSS 11/10/2009  . Biventricular cardiac pacemaker in situ 07/22/2009  . INTERSTITIAL CYSTITIS 07/14/2009  . Pancytopenia ([place]) 12/31/2008  . MITRAL REGURGITATION, MODERATE 12/31/2008  . Chronic systolic heart failure ([place]) 12/31/2008  . ERECTILE DYSFUNCTION 01/31/2008  .  Glaucoma 01/31/2008  . Essential hypertension 01/31/2008    LABS    Component Value Date/Time   NA 146 04/05/2016 1421   NA 140 03/16/2016 0400   NA 139 03/15/2016 1143   K 3.7 04/05/2016 1421   K 3.2* 03/16/2016 0400   K 3.7 03/15/2016 1143   CL 108 04/05/2016 1421   CL 103 03/16/2016 0400   CL 105 03/15/2016 1143   CO2 26 04/05/2016 1421   CO2 31 03/16/2016 0400   CO2 29 03/15/2016 1143   GLUCOSE 95 04/05/2016 1421   GLUCOSE 91 03/16/2016 0400   GLUCOSE 118* 03/15/2016 1143   BUN 27* 04/05/2016 1421   BUN 17 03/16/2016 0400   BUN 19 03/15/2016 1143   CREATININE 1.01 04/05/2016 1421    CREATININE 1.00 03/16/2016 0400   CREATININE 1.20 03/15/2016 1143   CREATININE 1.10 03/01/2016 1952   CREATININE 0.94 10/01/2015 1653   CREATININE 1.07 05/12/2015 1338   CALCIUM 8.5* 04/05/2016 1421   CALCIUM 8.3* 03/16/2016 0400   CALCIUM 8.4* 03/15/2016 1143   GFRNONAA >60 03/16/2016 0400   GFRNONAA 53* 03/15/2016 1143   GFRNONAA >60 02/19/2016 0628   GFRAA >60 03/16/2016 0400   GFRAA >60 03/15/2016 1143   GFRAA >60 02/19/2016 0628   CMP     Component Value Date/Time   NA 146 04/05/2016 1421   K 3.7 04/05/2016 1421   CL 108 04/05/2016 1421   CO2 26 04/05/2016 1421   GLUCOSE 95 04/05/2016 1421   BUN 27* 04/05/2016 1421   CREATININE 1.01 04/05/2016 1421   CREATININE 1.00 03/16/2016 0400   CALCIUM 8.5* 04/05/2016 1421   PROT 6.5 02/14/2016 1542   ALBUMIN 3.5 02/14/2016 1542   AST 26 02/14/2016 1542   ALT 18 02/14/2016 1542   ALKPHOS 58 02/14/2016 1542   BILITOT 2.5* 02/14/2016 1542   GFRNONAA >60 03/16/2016 0400   GFRAA >60 03/16/2016 0400       Component Value Date/Time   WBC 2.7* 04/05/2016 1421   WBC 2.5* 03/15/2016 1143   WBC 2.8* 02/19/2016 0628   HGB 9.6* 04/05/2016 1421   HGB 10.5* 03/15/2016 1143   HGB 10.9* 03/01/2016 1952   HCT 29.5* 04/05/2016 1421   HCT 30.9* 03/15/2016 1143   HCT 32.0* 03/01/2016 1952   MCV 99.0 04/05/2016 1421   MCV 99.0 03/15/2016 1143   MCV 96.9 02/19/2016 0628    Lipid Panel     Component Value Date/Time   CHOL 179 06/19/2013 1241   TRIG 66 06/19/2013 1241   HDL 51 06/19/2013 1241   CHOLHDL 3.5 06/19/2013 1241   VLDL 13 06/19/2013 1241   LDLCALC 115* 06/19/2013 1241    ABG    Component Value Date/Time   TCO2 26 03/01/2016 1952     Lab Results  Component Value Date   TSH 2.875 05/27/2015   BNP (last 3 results)  Recent Labs  10/01/15 1653  BNP 583.4*    ProBNP (last 3 results) No results for input(s): PROBNP in the last 8760 hours.  Cardiac Panel (last 3 results) No results for input(s): CKTOTAL, CKMB,  TROPONINI, RELINDX in the last 72 hours.  Iron/TIBC/Ferritin/ %Sat    Component Value Date/Time   IRON 78 12/29/2015 1313   TIBC 308 12/29/2015 1313   FERRITIN 60 12/29/2015 1313   IRONPCTSAT 25 12/29/2015 1313     EKG Orders placed or performed during the hospital encounter of 03/15/16  . EKG 12-Lead  . EKG 12-Lead  . EKG  Prior Assessment and Plan Problem List as of 04/09/2016      Cardiovascular and Mediastinum   MITRAL REGURGITATION, MODERATE   Last Assessment & Plan 08/08/2014 Office Visit Written 08/08/2014  4:06 PM by Lendon Colonel, NP    No apparent for repair or surgery at this time. Multiple comorbidities. Continue medical management.      Essential hypertension   Last Assessment & Plan 02/20/2016 Office Visit Written 02/20/2016  3:42 PM by Fayrene Helper, MD    Controlled, no change in medication       Chronic systolic heart failure [facility])   Last Assessment & Plan 02/20/2016 Office Visit Written 02/20/2016  3:36 PM by Fayrene Helper, MD    Increased shortness of breath  noted in pt with established heart failure and possible nocturnal wheezuing, needs overnight pulse oximetry, will refer      Atrial fibrillation, persistent [facility])   Last Assessment & Plan 10/01/2015 Office Visit Written 10/01/2015  3:45 PM by Erlene Quan, PA-C    Unable to tolerate anticoagulation secondary to GI bleeding CHADs VASc=3      Pulmonary hypertension [facility])   Last Assessment & Plan 02/20/2016 Office Visit Written 02/20/2016  3:43 PM by Fayrene Helper, MD    Shortness of breath and wheezing noted a at night increasingly by spouse, refer for overnight pulse oximetry      Acute CHF (congestive heart failure) ([place])   Mitral regurgitation   Tricuspid regurgitation     Digestive   GERD (gastroesophageal reflux disease)   Last Assessment & Plan 09/03/2013 Office Visit Written 09/09/2013  2:31 PM by Fayrene Helper, MD    Controlled, no change in medication Followed by  GI        Nervous and Auditory   CENTRAL HEARING LOSS   Last Assessment & Plan 12/28/2013 Office Visit Written 12/29/2013  9:11 AM by Fayrene Helper, MD    needs hearing aids, will advise checking belltone  For help with this      Alzheimer's dementia   Last Assessment & Plan 11/12/2015 Office Visit Written 11/17/2015  2:01 PM by Fayrene Helper, MD    No behavioral issues currently, stable and on no medication      Bilateral leg weakness   Last Assessment & Plan 09/09/2014 Office Visit Written 02/02/2015 10:59 AM by Fayrene Helper, MD    Improved with recent in house therapy, he is to continue out pt in home PT/OT  For next approx 4 weeks      Myelodysplasia   Last Assessment & Plan 11/03/2015 Office Visit Edited 11/03/2015  3:28 PM by Baird Cancer, PA-C    Pancytopenia in the setting of a bone marrow aspiration and biopsy on 0000000 by Dr. Sterling Big at [facility] demonstrating MDS with 22q deletion, normocellular marrow at 25% without evidence of melanoma, lymphoma, or morphologic evidence of MDS.  However, decreased iron stores were noted.  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  In November, his ferritin was less than 100 and his calculated iron deficit was approximately 370 mg.  He was therefore given ferric gluconate 125 mg x 2.   Oncology Flowsheet 09/08/2015 09/15/2015  ferric gluconate (NULECIT) IV 125 mg 125 mg    At this time, there is no need for ESA therapy.  However, if he anemia progresses, we could absolutely consider ESA treatment.  His HGB is stable without any significant  change.  Leukopenia is noted.  No recent infections or hospitalizations associated with infections.  If this becomes an issue GSF support can be considered.  Not necessary now.  Stable.  Neutrophil count is stable without any major changes.  Re-dressing of right upper arm completed by myself and nursing.  Cutting of scab from  Band-Aid was completed.  New dressing placed and recommended he keep dressing in place for 48 hours.  Labs in 8 weeks: CBC diff, CMET, iron/TIBC, ferritin.  Return in 8 weeks for follow-up.  Chart is reviewed.  I agree with SNF placement and will defer to primary care provider.        Musculoskeletal and Integument   GEN OSTEOARTHROSIS INVOLVING MULTIPLE SITES   Last Assessment & Plan 06/12/2015 Office Visit Written 07/09/2015  8:31 PM by Fayrene Helper, MD    Continue current medication      Pigmented skin lesion     Genitourinary   INTERSTITIAL CYSTITIS   Last Assessment & Plan 06/12/2015 Office Visit Written 07/09/2015  8:36 PM by Fayrene Helper, MD    Followed by urology has implanted device as well as is on medication for symptoms, still c/o excessive nocturia and wants to stop his diuretic, the importance of maintaining medications as prescribed for heart failure protection is again discussed        Hematopoietic and Hemostatic   Pancytopenia [facility])   Last Assessment & Plan 09/03/2015 Office Visit Written 09/03/2015 11:22 AM by Baird Cancer, PA-C    Secondary to MDS        Other   ERECTILE DYSFUNCTION   Last Assessment & Plan 05/10/2011 Office Visit Written 05/10/2011 10:00 AM by Fayrene Helper, MD    Pt states that the cialis is not working as well, he still has a lot, advised him to discuss further with his urologist      Glaucoma   Last Assessment & Plan 02/25/2015 Office Visit Written 04/04/2015 11:06 PM by Fayrene Helper, MD    Followed by opthalmology and compliant with drops      Backache   Last Assessment & Plan 02/20/2016 Office Visit Edited 02/20/2016  3:43 PM by Fayrene Helper, MD    Chronic management to continue      Biventricular cardiac pacemaker in situ   Last Assessment & Plan 11/12/2015 Office Visit Written 11/17/2015  2:09 PM by Fayrene Helper, MD    Current proposal per spouse is that she learn how to monitor from home, she is  overwhelmed ans states she is incapable wants an alternative arrangement, requests [facility] help again      Abnormal gait   Last Assessment & Plan 11/12/2015 Office Visit Written 11/17/2015  2:01 PM by Fayrene Helper, MD    High fall risk and incapable of self care , will refer to [facility] for evaluation for any possible assistance, he lives with his spouse who is also aged and less capable of independent living, however , they both still prefer to live in their own home together at this time      Recurrent falls   Last Assessment & Plan 02/20/2016 Office Visit Written 02/20/2016  3:42 PM by Fayrene Helper, MD    Increased weakness , recurrent falls, spouse states she is now ready to get in home health twice weekly, will ask for [facility] help  To navigate the process      Dyspnea   Last Assessment & Plan 01/06/2015 Office Visit  Written 02/09/2015  5:28 PM by Fayrene Helper, MD    Recent increase with worsened  exercise tolerance and recurrent falls, pt sent to [facility] for further eval      Fatigue   Last Assessment & Plan 10/01/2015 Office Visit Edited 10/01/2015  4:01 PM by Erlene Quan, PA-C    His main complaint today is fatigue x 1 week. "just feel run down"      Chronic back pain   Last Assessment & Plan 11/12/2015 Office Visit Written 11/17/2015  1:56 PM by Fayrene Helper, MD    Reports improved symptoms and less lower extremity pain with topical anti inflammatory from ortho      Generalized anxiety disorder       Imaging: Dg Chest 2 View  03/15/2016  CLINICAL DATA:  Chest pain and shortness of breath. Dizziness with generalized weakness. EXAM: CHEST  2 VIEW COMPARISON:  02/19/2016 FINDINGS: The lungs are clear wiithout focal pneumonia, edema, pneumothorax or pleural effusion. Interstitial markings are diffusely coarsened with chronic features. The cardio pericardial silhouette is enlarged. Left-sided pacer again noted. Bones are diffusely demineralized. Mild anterior wedge compression  deformity T12 is stable. IMPRESSION: Stable exam.  Cardiomegaly with emphysema. Electronically Signed   By: Misty Stanley M.D.   On: 03/15/2016 12:59   Ct Angio Chest Pe W/cm &/or Wo Cm  03/15/2016  CLINICAL DATA:  82 year old male with acute chest pain and dizziness. EXAM: CT ANGIOGRAPHY CHEST WITH CONTRAST TECHNIQUE: Multidetector CT imaging of the chest was performed using the standard protocol during bolus administration of intravenous contrast. Multiplanar CT image reconstructions and MIPs were obtained to evaluate the vascular anatomy. CONTRAST:  100 cc Isovue 370 COMPARISON:  Chest radiograph dated 03/15/2016 FINDINGS: For Evaluation of this exam is limited due to respiratory motion artifact. There small bilateral pleural effusions, right greater left. There are patchy areas of ground-glass and hazy airspace density bilaterally permanently involving the right lung base compatible with congestive changes and interstitial edema. Pneumonia is not excluded. Clinical correlation is recommended. There is no focal consolidation. No pneumothorax. The central airways are patent. There is atherosclerotic calcification of the thoracic aorta. The aortic arch measures up to 3.5 cm in diameter. Evaluation of the aorta is limited due to non opacification. There is mild dilatation of the main pulmonary trunk indicative of a degree of underlying pulmonary hypertension. Evaluation of the pulmonary arteries is limited due to respiratory motion artifact and suboptimal opacification of the peripheral branches. No definite central pulmonary artery embolus identified. There is severe cardiomegaly. Small pericardial effusion. There is coronary vascular calcification. There is retrograde flow of contrast from the right atrium into the IVC compatible with right cardiac dysfunction. Correlation with clinical exam and echocardiogram recommended. There is no hilar or mediastinal adenopathy. The esophagus is grossly unremarkable. There  is no axillary adenopathy. There is diffuse subcutaneous soft tissue stranding and edema and anasarca. There is osteopenia with degenerative changes of the spine. No acute fracture. There multiple stones within the gallbladder. The visualized upper abdomen is otherwise grossly unremarkable. Review of the MIP images confirms the above findings. IMPRESSION: No CT evidence of central pulmonary artery embolus. Mildly dilated aortic arch. Recommend annual imaging followup by CTA or MRA. This recommendation follows 2010 ACCF/AHA/AATS/ACR/ASA/SCA/SCAI/SIR/STS/SVM Guidelines for the Diagnosis and Management of Patients with Thoracic Aortic Disease. Circulation.2010; 121: [id number] Severe cardiomegaly with evidence of right cardiac dysfunction and small pericardial effusion. Correlation with echocardiogram recommended. Congestive heart failure with small bilateral pleural effusions. No  focal consolidation or pneumothorax. Electronically Signed   By: Anner Crete M.D.   On: 03/15/2016 13:15

## 2016-04-09 NOTE — Progress Notes (Signed)
Cardiology Office Note   Date:  04/09/2016   ID:  [name], DOB October 07, 1925, MRN [id number]  PCP:  [name], MD  Cardiologist: [name], NP   No chief complaint on file.     History of Present Illness: [name] is a 82 y.o. male who presents for ongoing assessment and management of chronic systolic heart failure with LVEF with EF of 55-60% per echo in 2016, atrial fibrillation but not on anticoagulation due to history of GI bleed, intolerant to beta blockers due to side effects. History of mitral regurgitation moderate to severe by echo in 10 2016, tricuspid regurg moderate to severe by echo with PASD of 69, mild RV dysfunction,and history of biventricular pacemaker placed by Dr. Lovena Le and managed by his clinic.  He was last seen in the office by Dr. Harl Bowie in may of 2017. This was a preoperative evaluation for bladder surgery by Dr. Alyson Ingles.he was recommended to proceed with surgery without any additional cardiac testing as it would not affect her treatment course.  The patient did undergo TURP on 02/11/2016.the patient was without complications, and was allowed to return home post procedure.  Unfortunately, the patient was admitted to the hospital on 03/15/2016 in the setting of decompensated CHF, secondary to valvular disease and right ventricular dysfunction. He also was complaining of chest discomfort. He was ruled out for ACS. He was treated with IV diuresis and was well compensated on day of discharge. He was started on aspirin 81 mg daily and increased dose of Lasix to 20 mg daily (may take twice if needed) and continued on other home medications.  I am  see him today in conjunction with his wife who does have an appointment at the same time. His wife states that he has been getting up and walking during the middle of the night. She woke up this morning and found that he had urinated on the carpet overnight. He continues to have complaints of chronic pain in  his chest. Some shortness of breath. He is on been taking the Lasix once a day instead of twice a day. He does not like taking it in the afternoon. He also has oxygen that he is to wear at night but often takes it off.  Past Medical History  Diagnosis Date  . Chronic systolic heart failure ([place])   . Rash and other nonspecific skin eruption   . Costochondritis, acute   . Glaucoma   . Erectile dysfunction   . Visual changes   . Pancytopenia   . Chronic pancreatitis ([place])     Based on CT findings  . History of melanoma in situ     JAN 2012--  LEFT HEEL W/ SLN BX  . Chronic back pain   . IC (interstitial cystitis)   . BPH (benign prostatic hypertrophy)   . Cardiac pacemaker     CARDIOLOGIST-- DR Cristopher Peru (LAST PACE [facility] 05-15-2013)  . Asymptomatic carotid artery stenosis     BILATERAL MILD ICA---  <50% PER DUPLEX 03-08-2008  . History of syncope   . Hypertension   . CHB (complete heart block) ([place])   . Nonischemic cardiomyopathy ([place])   . Severe mitral regurgitation   . Urge urinary incontinence   . A-fib ([place])   . Dementia   . Alzheimer disease   . Arthritis   . Cancer ([place])   . Poor historian   . HOH (hard of hearing)     Past Surgical History  Procedure Laterality Date  .  Cataract extraction, bilateral  left  1997/   right 2003  . Pacemaker insertion  12-06-2008  DR GREGG TAYLOR    BiV PPM  --  [employer]  . Colonoscopy  08/18/2006    [id number] granularity and erosions of the rectum of uncertain significance biopsied.  A long redundant, but otherwise normal-appearing colon.melanosi coli and minimal inflammation on bx  . Inguinal hernia repair Bilateral AS TEEN  . Interstim implant placement  2001  &  2007  . Cysto/ hod/  replacement interstim implant  05-14-2003  . Removal interstim implant/  cyst/  hod  04-14-2004  . Revision interstim implant and replace generator  05-15-2009    left upper buttock for urge urinary incontinence  . Wide excision left heel and sln  bx  JUNE 2012  . Tonsillectomy  1950  . Left elbow surgery  2002  . Back surgery  X3  LAST ONE'90's  . Transthoracic echocardiogram  12-06-2008    LVSF 55-60%/  MODERATE MR/  MILD AR/  QUESTION DISTAL POSTERIOR HYPOKINESIS  . Interstim implant revision N/A 08/28/2013    Procedure: REPLACEMENT OF INTERSTIM-GENERATOR AND LEAD ;  Surgeon: [name], MD;  Location: [facility];  Service: Urology;  Laterality: N/A;  . Esophagogastroduodenoscopy N/A 03/28/2014    Dr. [name]: normal esophagus s/p dilation, mosaic appearance - chronic inflammation noted on bx  . Savory dilation N/A 03/28/2014    Procedure: SAVORY DILATION;  Surgeon: [name], MD;  [facility];  Service: Endoscopy;  Laterality: N/A;  Venia Minks dilation N/A 03/28/2014    Procedure: Venia Minks DILATION;  Surgeon: [name], MD;  [facility];  Service: Endoscopy;  Laterality: N/A;  . Colonoscopy N/A 08/05/2014    Procedure: COLONOSCOPY;  Surgeon: [name], MD;  [facility];  Service: Endoscopy;  Laterality: N/A;  PT NEEDS TCS AT 1000.  . Implantable cardioverter defibrillator (icd) generator change N/A 10/11/2014    Procedure: ICD GENERATOR CHANGE;  Surgeon: [name], MD;  Location: [facility] CATH LAB;  Service: Cardiovascular;  Laterality: N/A;  . Transurethral resection of bladder tumor with gyrus (turbt-gyrus) N/A 02/11/2016    Procedure: TRANSURETHRAL RESECTION OF BLADDER TUMOR WITH GYRUS (TURBT-GYRUS);  Surgeon: Cleon Gustin, MD;  Location: [facility];  Service: Urology;  Laterality: N/A;  . Cystoscopy w/ retrogrades N/A 02/11/2016    Procedure: CYSTOSCOPY WITH RETROGRADE PYELOGRAM;  Surgeon: Cleon Gustin, MD;  Location: [facility];  Service: Urology;  Laterality: N/A;     Current Outpatient Prescriptions  Medication Sig Dispense Refill  . amLODipine (NORVASC) 2.5 MG tablet TAKE 1 TABLET (2.5 MG TOTAL) BY MOUTH DAILY. 30 tablet 3  . aspirin EC 81 MG EC tablet  Take 1 tablet (81 mg total) by mouth daily.    . busPIRone (BUSPAR) 5 MG tablet One tablet once daily for anxiety 30 tablet 2  . Carboxymethylcellul-Glycerin 0.5-0.9 % SOLN Apply to eye daily. One drop both eyes    . diclofenac sodium (VOLTAREN) 1 % GEL Apply 2 g topically daily as needed (pain). Reported on 01/05/2016    . dorzolamide (TRUSOPT) 2 % ophthalmic solution 1 drop daily. One drop both eyes    . fentaNYL (DURAGESIC - DOSED MCG/HR) 25 MCG/HR patch Place 1 patch (25 mcg total) onto the skin every 3 (three) days. 10 patch 0  . furosemide (LASIX) 20 MG tablet Take 1 tablet (20 mg total) by mouth daily. May take twice if needed.  30 tablet 2  . ibuprofen (ADVIL,MOTRIN) 200 MG tablet Take 400 mg by mouth every 6 (six) hours as needed for moderate pain.    Marland Kitchen latanoprost (XALATAN) 0.005 % ophthalmic solution 1 drop at bedtime. Both eyes    . Meth-Hyo-M Bl-Na Phos-Ph Sal (URIBEL) 118 MG CAPS Take 1 capsule (118 mg total) by mouth 2 (two) times daily as needed (bladder). 30 capsule 0  . polyethylene glycol powder (GLYCOLAX/MIRALAX) powder Take 17 g by mouth daily. 3350 g 1  . furosemide (LASIX) 20 MG tablet Take 1 tablet (20 mg total) by mouth 2 (two) times daily. Take in the AM and At 4 PM 180 tablet 3   No current facility-administered medications for this visit.    Allergies:   Codeine and Penicillins    Social History:  The patient  reports that he quit smoking about 18 years ago. His smoking use included Cigarettes. He has a .6 pack-year smoking history. He has never used smokeless tobacco. He reports that he does not drink alcohol or use illicit drugs.   Family History:  The patient's family history includes Cancer (age of onset: 75) in his father; Dementia in his mother; Heart disease in his brother; Lung cancer in his sister; Throat cancer in his father.    ROS: All other systems are reviewed and negative. Unless otherwise mentioned in H&P    PHYSICAL EXAM: VS:  BP 132/78 mmHg   Pulse 74  Ht 5\' 7"  (1.702 m)  Wt 158 lb (71.668 kg)  BMI 24.74 kg/m2  SpO2 91% , BMI Body mass index is 24.74 kg/(m^2). GEN: Well nourished, well developed, in no acute distress HEENT: normal Neck: no JVD, carotid bruits, or masses Cardiac: [id number] systolic murmurs, rubs, or gallops,no edema  Respiratory:  clear to auscultation bilaterally, normal work of breathing GI: soft, nontender, nondistended, + BS MS: no deformity or atrophy Skin: warm and dry, no rash Neuro:  Strength and sensation are intact Psych: euthymic mood, flat  affect  Recent Labs: 05/27/2015: TSH 2.875 10/01/2015: Brain Natriuretic Peptide 583.4* 02/14/2016: ALT 18 04/05/2016: BUN 27*; Creat 1.01; Hemoglobin 9.6*; Platelets 90*; Potassium 3.7; Sodium 146    Lipid Panel    Component Value Date/Time   CHOL 179 06/19/2013 1241   TRIG 66 06/19/2013 1241   HDL 51 06/19/2013 1241   CHOLHDL 3.5 06/19/2013 1241   VLDL 13 06/19/2013 1241   LDLCALC 115* 06/19/2013 1241      Wt Readings from Last 3 Encounters:  04/09/16 158 lb (71.668 kg)  04/07/16 156 lb 9.6 oz (71.033 kg)  04/05/16 163 lb 1.9 oz (73.991 kg)     ASSESSMENT AND PLAN:  1.  Persistent atrial fibrillation:  He is not on anticoagulation due to dementia and frequent falls.he is otherwise stable. Heart rate is controlled without AV nodal blocking agents.  2. Hypertension:currently well-controlled.  3. Chronic diastolic heart failure:we'll continue him on Lasix 20 mg twice a day. I've asked him to take his afternoon dose around 3 or 4 so that he will not be up at night urinating. He is to avoid fluids before bedtime.   I have expressed my concerns to his wife during this office visit concerning the burden of care for him. Her daughter has provided Meals on Wheels for them which assists with her cooking responsibilities, that he is becoming more confused and is getting up at night. Last night he urinated on the carpet. I believe that they would be better  suited and  an assisted-living facility where a lot of the responsibilities are taken off of his wife shoulders and medications can be provided along with meals. His wife does not wish to discuss this option at this time. There may come a time where this will not be negotiable she will for her health as well as his.    Current medicines are reviewed at length with the patient today.    Labs/ tests ordered today include: BMET  Orders Placed This Encounter  Procedures  . Basic Metabolic Panel (BMET)     Disposition:   FU with 6 months  Signed, Jory Sims, NP  04/09/2016 3:56 PM    [facility] [address], [place], [place] [address] Phone: [phone]; Fax: [phone]

## 2016-04-10 DIAGNOSIS — F05 Delirium due to known physiological condition: Secondary | ICD-10-CM | POA: Insufficient documentation

## 2016-04-10 DIAGNOSIS — Z09 Encounter for follow-up examination after completed treatment for conditions other than malignant neoplasm: Secondary | ICD-10-CM | POA: Insufficient documentation

## 2016-04-10 NOTE — Assessment & Plan Note (Addendum)
Wife reports acute confusion over past 2 days, though not evident at visit . Urinalysis is abnormal, furhter testing to infection to follow

## 2016-04-10 NOTE — Assessment & Plan Note (Signed)
Uncontrolled , requests medication "like his wife's", low dose buspar prescribed

## 2016-04-10 NOTE — Assessment & Plan Note (Signed)
Still c/o leg swelling despite wanting to discontinue his diuretic which he needs to stay on due to heart failure, I again explain this to him C/o fatigue and confusion x 2 days, urinalysis is abnormal , needs to be cultured, and baseline labs to be obtained to evaluate WBC count , hb level and hydration status

## 2016-04-10 NOTE — Progress Notes (Signed)
   Subjective:    Patient ID: [name], male    DOB: Aug 12, 1926, 82 y.o.   MRN: [id number]  HPI Patient in for follow up of recent hospitalization. Discharge summary, and laboratory and radiology data are reviewed, and any questions or concerns about recent hospitalization are discussed. Specific issues requiring follow up are specifically addressed.    Review of Systems See HPI Denies recent fever or chills.c/o fatigue Denies sinus pressure, nasal congestion, ear pain or sore throat. Denies chest congestion, productive cough or wheezing. Denies chest pains, palpitations c/o leg swelling Denies abdominal pain, nausea, vomiting,diarrhea or constipation.   C/o frequency . C/o uncontrolled  joint pain, ng and limitation in mobility. Denies headaches, seizures, numbness, or tingling. Denies depression, c/o  anxiety or insomnia. C/o dark skin lesions in 2 areas, under fingernail and on his chest, which he wants checked        Objective:   Physical Exam BP 130/80 mmHg  Pulse 65  Resp 16  Ht 5\' 11"  (1.803 m)  Wt 163 lb 1.9 oz (73.991 kg)  BMI 22.76 kg/m2  SpO2 92%  Patient alert  and in no cardiopulmonary distress.He does not appear to be in pain, nor is he acutely confused  HEENT: No facial asymmetry, EOMI,   oropharynx pink and moist.  Neck decreased ROM no JVD, no mass.  Chest: Clear to auscultation bilaterally.  CVS: S1, S2 no murmurs, no S3.Regular rate.  ABD: Soft non tender.   Ext: No edema  MS: Decreased  ROM spine, shoulders, hips and knees.  Skin: Intact, hyperpigmented lesion under fingernail, 4th right finger, also macular lesion on ant chest approx 0.5 cm in diameter  Psych: Good eye contact, normal affect. Memory impaired, anxious, not depressed appearing : CN 2-12 intact, power,  normal throughout.no focal deficits noted.        Assessment & Plan:  Hospital discharge follow-up Still c/o leg swelling despite wanting to discontinue his diuretic  which he needs to stay on due to heart failure, I again explain this to him C/o fatigue and confusion x 2 days, urinalysis is abnormal , needs to be cultured, and baseline labs to be obtained to evaluate WBC count , hb level and hydration status  Generalized anxiety disorder Uncontrolled , requests medication "like his wife's", low dose buspar prescribed  Chronic back pain Reports poor control on current regime, manged by ortho who he sees regularly, and he also is on fentanyl patch. No evidence of uncontrolled pain and discomfort at office visits  Alzheimer's dementia Would benefit from adult day care setting , as this would provide some relief for his wife , will message his case worker to [facility] into the feasibility of this  Pigmented skin lesion Hyperpigmented lesion under rigth 4th fingernail, increasing in size, also anterior chest lesion with varied pigmentation. Pt has h/o melanoma in past  Years. Refer to dermatology for skin evaluation  Acute confusional state Wife reports acute confusion over past 2 days, though not evident at visit . Urinalysis is abnormal, furhter testing to infection to follow

## 2016-04-10 NOTE — Assessment & Plan Note (Addendum)
Would benefit from adult day care setting , as this would provide some relief for his wife , will message his case worker to [facility] into the feasibility of this

## 2016-04-10 NOTE — Assessment & Plan Note (Addendum)
Reports poor control on current regime, manged by ortho who he sees regularly, and he also is on fentanyl patch. No evidence of uncontrolled pain and discomfort at office visits

## 2016-04-10 NOTE — Assessment & Plan Note (Signed)
Hyperpigmented lesion under rigth 4th fingernail, increasing in size, also anterior chest lesion with varied pigmentation. Pt has h/o melanoma in past  Years. Refer to dermatology for skin evaluation

## 2016-04-12 ENCOUNTER — Telehealth: Payer: Self-pay

## 2016-04-12 LAB — URINE CULTURE: Culture: >=100000 — AB

## 2016-04-12 MED ORDER — NITROFURANTOIN MONOHYD MACRO 100 MG PO CAPS: 100.0000 mg | ORAL_CAPSULE | Freq: Two times a day (BID) | ORAL | Status: DC

## 2016-04-12 NOTE — Telephone Encounter (Signed)
-----   Message from Fayrene Helper, MD sent at 04/12/2016  9:00 AM EDT ----- plsa advise and send in nitrofurantoin 100 mg one twice daily  for 3 days only

## 2016-04-13 ENCOUNTER — Ambulatory Visit (INDEPENDENT_AMBULATORY_CARE_PROVIDER_SITE_OTHER): Payer: Medicare Other

## 2016-04-13 ENCOUNTER — Encounter: Payer: Self-pay | Admitting: Orthopaedic Surgery

## 2016-04-13 ENCOUNTER — Ambulatory Visit (INDEPENDENT_AMBULATORY_CARE_PROVIDER_SITE_OTHER): Payer: Medicare Other | Admitting: Orthopaedic Surgery

## 2016-04-13 VITALS — BP 127/78 | HR 86 | Temp 97.5°F | Resp 16 | Ht 68.0 in | Wt 154.0 lb

## 2016-04-13 DIAGNOSIS — I255 Ischemic cardiomyopathy: Secondary | ICD-10-CM | POA: Diagnosis not present

## 2016-04-13 DIAGNOSIS — M25571 Pain in right ankle and joints of right foot: Secondary | ICD-10-CM | POA: Diagnosis not present

## 2016-04-13 NOTE — Progress Notes (Signed)
Patient [name] [name], male DOB:01-13-1926, 82 y.o. [id number]  Chief Complaint  Patient presents with  . Follow-up    ankle pain    HPI  [name] is a 82 y.o. male who has right ankle pain.  He has swelling.  He has no redness. He denies any trauma.  He is using a cane.  It has been hurting a week.  He has used ice and elevation.  HPI  Body mass index is 23.42 kg/(m^2).  ROS  Review of Systems  HENT: Positive for congestion.   Eyes: Positive for visual disturbance.  Respiratory: Positive for cough and shortness of breath.   Cardiovascular: Negative for chest pain and leg swelling.  Endocrine: Positive for cold intolerance.  Musculoskeletal: Positive for myalgias, back pain, joint swelling, arthralgias and gait problem.  Allergic/Immunologic: Positive for environmental allergies.    Past Medical History  Diagnosis Date  . Chronic systolic heart failure ([place])   . Rash and other nonspecific skin eruption   . Costochondritis, acute   . Glaucoma   . Erectile dysfunction   . Visual changes   . Pancytopenia   . Chronic pancreatitis ([place])     Based on CT findings  . History of melanoma in situ     JAN 2012--  LEFT HEEL W/ SLN BX  . Chronic back pain   . IC (interstitial cystitis)   . BPH (benign prostatic hypertrophy)   . Cardiac pacemaker     CARDIOLOGIST-- DR Cristopher Peru ([facility] 05-15-2013)  . Asymptomatic carotid artery stenosis     BILATERAL MILD ICA---  <50% PER DUPLEX 03-08-2008  . History of syncope   . Hypertension   . CHB (complete heart block) ([place])   . Nonischemic cardiomyopathy ([place])   . Severe mitral regurgitation   . Urge urinary incontinence   . A-fib ([place])   . Dementia   . Alzheimer disease   . Arthritis   . Cancer ([place])   . Poor historian   . HOH (hard of hearing)     Past Surgical History  Procedure Laterality Date  . Cataract extraction, bilateral  left  1997/   right 2003  . Pacemaker insertion  12-06-2008  DR GREGG TAYLOR    BiV PPM  --  [employer]  . Colonoscopy  08/18/2006    [id number] granularity and erosions of the rectum of uncertain significance biopsied.  A long redundant, but otherwise normal-appearing colon.melanosi coli and minimal inflammation on bx  . Inguinal hernia repair Bilateral AS TEEN  . Interstim implant placement  2001  &  2007  . Cysto/ hod/  replacement interstim implant  05-14-2003  . Removal interstim implant/  cyst/  hod  04-14-2004  . Revision interstim implant and replace generator  05-15-2009    left upper buttock for urge urinary incontinence  . Wide excision left heel and sln bx  JUNE 2012  . Tonsillectomy  1950  . Left elbow surgery  2002  . Back surgery  X3  LAST ONE'90's  . Transthoracic echocardiogram  12-06-2008    LVSF 55-60%/  MODERATE MR/  MILD AR/  QUESTION DISTAL POSTERIOR HYPOKINESIS  . Interstim implant revision N/A 08/28/2013    Procedure: REPLACEMENT OF INTERSTIM-GENERATOR AND LEAD ;  Surgeon: [name], MD;  Location: [facility];  Service: Urology;  Laterality: N/A;  . Esophagogastroduodenoscopy N/A 03/28/2014    Dr. [name]: normal esophagus s/p dilation, mosaic appearance - chronic inflammation noted on bx  . Savory dilation  N/A 03/28/2014    Procedure: SAVORY DILATION;  Surgeon: [name], MD;  [facility];  Service: Endoscopy;  Laterality: N/A;  Venia Minks dilation N/A 03/28/2014    Procedure: Venia Minks DILATION;  Surgeon: [name], MD;  [facility];  Service: Endoscopy;  Laterality: N/A;  . Colonoscopy N/A 08/05/2014    Procedure: COLONOSCOPY;  Surgeon: [name], MD;  [facility];  Service: Endoscopy;  Laterality: N/A;  PT NEEDS TCS AT 1000.  . Implantable cardioverter defibrillator (icd) generator change N/A 10/11/2014    Procedure: ICD GENERATOR CHANGE;  Surgeon: [name], MD;  Location: [facility] CATH LAB;  Service: Cardiovascular;  Laterality: N/A;  . Transurethral resection of bladder tumor  with gyrus (turbt-gyrus) N/A 02/11/2016    Procedure: TRANSURETHRAL RESECTION OF BLADDER TUMOR WITH GYRUS (TURBT-GYRUS);  Surgeon: Cleon Gustin, MD;  Location: [facility];  Service: Urology;  Laterality: N/A;  . Cystoscopy w/ retrogrades N/A 02/11/2016    Procedure: CYSTOSCOPY WITH RETROGRADE PYELOGRAM;  Surgeon: Cleon Gustin, MD;  Location: [facility];  Service: Urology;  Laterality: N/A;    Family History  Problem Relation Age of Onset  . Dementia Mother   . Throat cancer Father   . Cancer Father 86    throat  . Lung cancer Sister   . Heart disease Brother     Social History Social History  Substance Use Topics  . Smoking status: Former Smoker -- 0.02 packs/day for 30 years    Types: Cigarettes    Quit date: 08/24/1997  . Smokeless tobacco: Never Used  . Alcohol Use: No    Allergies  Allergen Reactions  . Codeine Diarrhea  . Penicillins     Other reaction(s): OTHER  nHas patient had a PCN reaction causing immediate rash, facial/tongue/throat swelling, SOB or lightheadedness with hypotension: Nono Has patient had a PCN reaction causing severe rash involving mucus membranes or skin necrosis: Nono Has patient had a PCN reaction that required hospitalization Nono Has patient had a PCN reaction occurring within the last 10 years: Nono If all of the above answers are "NO", then may    Current Outpatient Prescriptions  Medication Sig Dispense Refill  . amLODipine (NORVASC) 2.5 MG tablet TAKE 1 TABLET (2.5 MG TOTAL) BY MOUTH DAILY. 30 tablet 3  . aspirin EC 81 MG EC tablet Take 1 tablet (81 mg total) by mouth daily.    . busPIRone (BUSPAR) 5 MG tablet One tablet once daily for anxiety 30 tablet 2  . Carboxymethylcellul-Glycerin 0.5-0.9 % SOLN Apply to eye daily. One drop both eyes    . diclofenac sodium (VOLTAREN) 1 % GEL Apply 2 g topically daily as needed (pain). Reported on 01/05/2016    . dorzolamide (TRUSOPT) 2 % ophthalmic solution 1 drop daily. One drop both eyes    .  fentaNYL (DURAGESIC - DOSED MCG/HR) 25 MCG/HR patch Place 1 patch (25 mcg total) onto the skin every 3 (three) days. 10 patch 0  . furosemide (LASIX) 20 MG tablet Take 1 tablet (20 mg total) by mouth daily. May take twice if needed. 30 tablet 2  . furosemide (LASIX) 20 MG tablet Take 1 tablet (20 mg total) by mouth 2 (two) times daily. Take in the AM and At 4 PM 180 tablet 3  . ibuprofen (ADVIL,MOTRIN) 200 MG tablet Take 400 mg by mouth every 6 (six) hours as needed for moderate pain.    Marland Kitchen latanoprost (XALATAN) 0.005 % ophthalmic solution  1 drop at bedtime. Both eyes    . Meth-Hyo-M Bl-Na Phos-Ph Sal (URIBEL) 118 MG CAPS Take 1 capsule (118 mg total) by mouth 2 (two) times daily as needed (bladder). 30 capsule 0  . nitrofurantoin, macrocrystal-monohydrate, (MACROBID) 100 MG capsule Take 1 capsule (100 mg total) by mouth 2 (two) times daily. X 3 days 6 capsule 0  . polyethylene glycol powder (GLYCOLAX/MIRALAX) powder Take 17 g by mouth daily. 3350 g 1   No current facility-administered medications for this visit.     Physical Exam  Blood pressure 127/78, pulse 86, temperature 97.5 F (36.4 C), resp. rate 16, height 5\' 8"  (1.727 m), weight 154 lb (69.854 kg).  Constitutional: overall normal hygiene, normal nutrition, well developed, normal grooming, normal body habitus. Assistive device:cane  Musculoskeletal: gait and station Limp right, muscle tone and strength are normal, no tremors or atrophy is present.  .  Neurological: coordination overall normal.  Deep tendon reflex/nerve stretch intact.  Sensation normal.  Cranial nerves II-XII intact.   Skin:   normal overall no scars, lesions, ulcers or rashes. No psoriasis.  Psychiatric: Alert and oriented x 3.  Recent memory intact, remote memory unclear.  Normal mood and affect. Well groomed.  Good eye contact.  Cardiovascular: overall no swelling, no varicosities, no edema bilaterally, normal temperatures of the legs and arms, no clubbing,  cyanosis and good capillary refill.  Lymphatic: palpation is normal.  He has swelling diffusely of the right ankle with no ecchymosis and no redness.  He has a slight limp to the right.  ROM of the right ankle is full and it is stable.  He has no crepitus.  Left ankle negative.  The patient has been educated about the nature of the problem(s) and counseled on treatment options.  The patient appeared to understand what I have discussed and is in agreement with it.  Encounter Diagnosis  Name Primary?  . Right ankle pain Yes   X-rays were done of the right ankle, reported separately.  I feel he has a strain of the right ankle.  I gave him instructions for contrast baths.  PLAN Call if any problems.  Precautions discussed.  Continue current medications.   Return to clinic 3 months   Electronically Signed Sanjuana Kava, MD 7/11/201710:38 AM

## 2016-05-06 ENCOUNTER — Encounter: Payer: Medicare Other | Admitting: *Deleted

## 2016-05-07 ENCOUNTER — Encounter: Payer: Self-pay | Admitting: Cardiology

## 2016-05-07 ENCOUNTER — Telehealth: Payer: Self-pay | Admitting: Cardiology

## 2016-05-07 NOTE — Telephone Encounter (Signed)
Confirmed remote transmission w/ pt wife.   

## 2016-05-10 ENCOUNTER — Encounter: Payer: Self-pay | Admitting: Family Medicine

## 2016-05-10 ENCOUNTER — Encounter (INDEPENDENT_AMBULATORY_CARE_PROVIDER_SITE_OTHER): Payer: Self-pay

## 2016-05-10 ENCOUNTER — Ambulatory Visit (INDEPENDENT_AMBULATORY_CARE_PROVIDER_SITE_OTHER): Payer: Medicare Other | Admitting: Family Medicine

## 2016-05-10 VITALS — BP 122/80 | HR 83 | Resp 16 | Ht 68.0 in | Wt 158.0 lb

## 2016-05-10 DIAGNOSIS — M5440 Lumbago with sciatica, unspecified side: Secondary | ICD-10-CM

## 2016-05-10 DIAGNOSIS — K219 Gastro-esophageal reflux disease without esophagitis: Secondary | ICD-10-CM

## 2016-05-10 DIAGNOSIS — I509 Heart failure, unspecified: Secondary | ICD-10-CM | POA: Diagnosis not present

## 2016-05-10 DIAGNOSIS — G309 Alzheimer's disease, unspecified: Secondary | ICD-10-CM

## 2016-05-10 DIAGNOSIS — F0280 Dementia in other diseases classified elsewhere without behavioral disturbance: Secondary | ICD-10-CM

## 2016-05-10 DIAGNOSIS — D61818 Other pancytopenia: Secondary | ICD-10-CM

## 2016-05-10 DIAGNOSIS — I255 Ischemic cardiomyopathy: Secondary | ICD-10-CM | POA: Diagnosis not present

## 2016-05-10 DIAGNOSIS — E559 Vitamin D deficiency, unspecified: Secondary | ICD-10-CM | POA: Diagnosis not present

## 2016-05-10 DIAGNOSIS — I5022 Chronic systolic (congestive) heart failure: Secondary | ICD-10-CM | POA: Diagnosis not present

## 2016-05-10 DIAGNOSIS — I10 Essential (primary) hypertension: Secondary | ICD-10-CM | POA: Diagnosis not present

## 2016-05-10 LAB — BASIC METABOLIC PANEL
BUN: 22 mg/dL (ref 7–25)
CO2: 28 mmol/L (ref 20–31)
Calcium: 8.7 mg/dL (ref 8.6–10.3)
Chloride: 107 mmol/L (ref 98–110)
Creat: 1.08 mg/dL (ref 0.70–1.11)
GLUCOSE: 115 mg/dL — AB (ref 65–99)
POTASSIUM: 3.8 mmol/L (ref 3.5–5.3)
Sodium: 144 mmol/L (ref 135–146)

## 2016-05-10 LAB — TSH: TSH: 4.23 m[IU]/L (ref 0.40–4.50)

## 2016-05-10 MED ORDER — AMLODIPINE BESYLATE 2.5 MG PO TABS: ORAL_TABLET | ORAL | 3 refills | Status: DC

## 2016-05-10 NOTE — Patient Instructions (Addendum)
Annual wellness in 3 month, call if you need me sooner  Chem 7 , BNP, tSH and vit D today  NEED to take the medication as prescribed  Use oxygen at night when sleeping

## 2016-05-11 ENCOUNTER — Other Ambulatory Visit: Payer: Self-pay | Admitting: Family Medicine

## 2016-05-11 DIAGNOSIS — I5041 Acute combined systolic (congestive) and diastolic (congestive) heart failure: Secondary | ICD-10-CM

## 2016-05-11 LAB — BRAIN NATRIURETIC PEPTIDE: Brain Natriuretic Peptide: 1122.3 pg/mL — ABNORMAL HIGH (ref <100)

## 2016-05-11 LAB — VITAMIN D 25 HYDROXY (VIT D DEFICIENCY, FRACTURES): Vit D, 25-Hydroxy: 26 ng/mL — ABNORMAL LOW (ref 30–100)

## 2016-05-12 ENCOUNTER — Encounter: Payer: Self-pay | Admitting: *Deleted

## 2016-05-12 ENCOUNTER — Other Ambulatory Visit: Payer: Self-pay | Admitting: *Deleted

## 2016-05-12 NOTE — Patient Outreach (Signed)
[facility]) Care Management   05/12/2016  [name] [birth year] [id number]  [name] is an 82 y.o. male  Subjective:  Pt reports for last 6-8 months- mid chest pain.  Pt reports he is tired, worn out.  Spouse reports pt saw Dr. Moshe Cipro 8/7, MD concerned about pt's heart, breathing, scheduled him to follow up with Heart MD.  Spouse reports pt does not always want to take his Lasix twice a day.  Pt reports he does not always use his Oxygen at night.  Spouse reports Buspar is helping pt.    Objective:   Vitals:   05/12/16 1026  BP: 104/70  Pulse: 60  Resp: (!) 28    ROS  Physical Exam  Constitutional: He appears well-developed and well-nourished.  Cardiovascular: Normal rate, regular rhythm and normal heart sounds.   Respiratory: Effort normal and breath sounds normal.  GI: Soft. Bowel sounds are normal.  Musculoskeletal: Normal range of motion. He exhibits edema.  Trace edema top of right foot.   Skin: Skin is warm and dry.  Bruising noted on both arms.   Psychiatric: His behavior is normal. Thought content normal.    Encounter Medications:  Reviewed with spouse  Outpatient Encounter Prescriptions as of 05/12/2016  Medication Sig Note  . acetaminophen (TYLENOL) 650 MG CR tablet Take 650 mg by mouth every 8 (eight) hours as needed.   Marland Kitchen amLODipine (NORVASC) 2.5 MG tablet TAKE 1 TABLET (2.5 MG TOTAL) BY MOUTH DAILY.   Marland Kitchen aspirin EC 81 MG EC tablet Take 1 tablet (81 mg total) by mouth daily. 04/07/2016: Spouse to get, start giving to pt.   . busPIRone (BUSPAR) 5 MG tablet One tablet once daily for anxiety   . diclofenac sodium (VOLTAREN) 1 % GEL Apply 2 g topically daily as needed (pain). Reported on 01/05/2016 03/18/2016: As needed.   . dorzolamide (TRUSOPT) 2 % ophthalmic solution 1 drop daily. One drop both eyes   . fentaNYL (DURAGESIC - DOSED MCG/HR) 25 MCG/HR patch Place 1 patch (25 mcg total) onto the skin every 3 (three) days.   . furosemide (LASIX) 20 MG tablet  Take 1 tablet (20 mg total) by mouth 2 (two) times daily. Take in the AM and At 4 PM 05/12/2016: Pt does not always take twice a day   . latanoprost (XALATAN) 0.005 % ophthalmic solution 1 drop at bedtime. Both eyes   . Meth-Hyo-M Bl-Na Phos-Ph Sal (URIBEL) 118 MG CAPS Take 1 capsule (118 mg total) by mouth 2 (two) times daily as needed (bladder).   . polyethylene glycol powder (GLYCOLAX/MIRALAX) powder Take 17 g by mouth daily. 05/12/2016: As needed.   . Carboxymethylcellul-Glycerin 0.5-0.9 % SOLN Apply to eye daily. One drop both eyes   . ibuprofen (ADVIL,MOTRIN) 200 MG tablet Take 400 mg by mouth every 6 (six) hours as needed for moderate pain. 05/12/2016: As needed.    No facility-administered encounter medications on file as of 05/12/2016.     Functional Status:   In your present state of health, do you have any difficulty performing the following activities: 03/15/2016 02/06/2016  Hearing? N Y  Vision? N Y  Difficulty concentrating or making decisions? [name]  Walking or climbing stairs? Y N  Dressing or bathing? N N  Doing errands, shopping? N N  Using the Toilet? - -  In the past six months, have you accidently leaked urine? - -  Do you have problems with loss of bowel control? - -  Managing your  Medications? - -  Managing your Finances? - -  Housekeeping or managing your Housekeeping? - -  Some recent data might be hidden    Fall/Depression Screening:    PHQ 2/9 Scores 01/05/2016 11/25/2015 04/11/2015 01/14/2015 01/14/2015 04/25/2013  PHQ - 2 Score 1 1 0 0 1 0    Assessment:  82 year old gentleman, appeared tired, dozing off during home visit.                           Spouse present during home visit- manages pt's medications.                          HF- lungs clear, c/o mid chest pain, been going on for 6-8 months, no c/o                            Sob.   Trace edema noted - top of right foot.  O2 sat after deep breaths- 94%.  Plan:   Pt to follow up with Heart MD 8/17.              Continue to provide pt with community nurse [name] management services, follow up                 Again next month- home visit.    THN CM Care Plan Problem One   Flowsheet Row Most Recent Value  Care Plan Problem One  Better management of Heart MD  Role Documenting the Problem One  Care Management Coordinator  Care Plan for Problem One  Active  THN Long Term Goal (31-90 days)  Spouse would assist pt to better manage HF in the next 60 days   THN Long Term Goal Start Date  05/12/16 Donivan Scull established.  ]  Interventions for Problem One Long Term Goal  Reviewed with pt/spouse importance of weighing, low Na+ diet, medication adherence.   THN CM Short Term Goal #1 (0-30 days)  Pt would take his fluid pill as ordered for the next 30 days   THN CM Short Term Goal #1 Start Date  05/12/16  Interventions for Short Term Goal #1  Reveiwed with pt/spouse importance of taking fluid pill as ordered.   THN CM Short Term Goal #3 (0-30 days)  Pt would switch to Mrs. Dash within the next 30 days   THN CM Short Term Goal #3 Start Date  05/12/16  Interventions for Short Tern Goal #3  Discussed with pt's spouse benefit of pt switching from table salt to Mrs.  Caffie Damme.   [place] Care Management  [phone]

## 2016-05-16 NOTE — Assessment & Plan Note (Signed)
Chronic and unchanged, managed in conjunction with orhtopedics

## 2016-05-16 NOTE — Assessment & Plan Note (Signed)
Controlled, no change in medication  

## 2016-05-16 NOTE — Progress Notes (Signed)
   [name]     MRN: [id number]      DOB: Nov 07, 1925   HPI [name] is here for follow up and re-evaluation of chronic medical conditions, medication management and review of any available recent lab and radiology data.  Preventive health is updated, specifically  Immunization.   Questions or concerns regarding consultations or procedures which the PT has had in the interim are  addressed. The PT denies any adverse reactions to current medications since the last visit.  States that he felt as though he was "leaving here" last weekend, and he resorted to taking a NTG tablet prescribed for his wife. Requests MMSE for this visit and has done very well, improved since prior, and off medication Wife reports intermittent , as needed, use of buspar for anxiety to which he responds well   ROS Denies recent fever or chills. Denies sinus pressure, nasal congestion, ear pain or sore throat. Denies chest congestion, productive cough or wheezing. C/o chest discomfort and light headedness this past weekend, felt as though he would pass out, denies orthopnea or leg swelling Denies abdominal pain, nausea, vomiting,diarrhea or constipation.   c/o  joint pain, swelling and limitation in mobility. Denies headaches, seizures, numbness, or tingling. Denies depression, anxiety or insomnia. Denies skin break down or rash.   PE  BP 122/80   Pulse 83   Resp 16   Ht 5\' 8"  (1.727 m)   Wt 158 lb (71.7 kg)   SpO2 95%   BMI 24.02 kg/m   Patient alert and oriented and in no cardiopulmonary distress.  HEENT: No facial asymmetry, EOMI,   oropharynx pink and moist.  Neck decreased ROM no JVD, no mass.  Chest: adequate air entry, bibasilar crackles.  CVS: S1, S2 systolic murmur, no S3.Regular rate.  ABD: Soft non tender.   Ext: No edema  MS: Adequate ROM spine, shoulders, hips and knees.  Skin: Intact, no ulcerations or rash noted.  Psych: Good eye contact, normal affect. Memory intact not anxious  or depressed appearing.  CNS: CN 2-12 intact, power,  normal throughout.no focal deficits noted.   Assessment & Plan Acute CHF (congestive heart failure) (HCC) Acute flare of symptoms this past weekend, reviewed with pt and spouse the importance of taking medication as prescribed,  Still not consistently taking his diuretic Will check BNP today based on symptom severity, reports he "felt like I was leaving here" and wife states she also gave him a NTG May need sooner cardiology appointment, will follow through  Essential hypertension Controlled, no change in medication   Backache Chronic and unchanged, managed in conjunction with orhtopedics  Alzheimer's dementia Stable off of medication  Pancytopenia (HCC) Unchanged, has ahd hematology eval in the past on several occasions  GERD (gastroesophageal reflux disease) Controlled, no change in medication

## 2016-05-16 NOTE — Assessment & Plan Note (Signed)
Acute flare of symptoms this past weekend, reviewed with pt and spouse the importance of taking medication as prescribed,  Still not consistently taking his diuretic Will check BNP today based on symptom severity, reports he "felt like I was leaving here" and wife states she also gave him a NTG May need sooner cardiology appointment, will follow through

## 2016-05-16 NOTE — Assessment & Plan Note (Signed)
Unchanged, has ahd hematology eval in the past on several occasions

## 2016-05-16 NOTE — Assessment & Plan Note (Signed)
Stable off of medication 

## 2016-05-20 ENCOUNTER — Ambulatory Visit (INDEPENDENT_AMBULATORY_CARE_PROVIDER_SITE_OTHER): Payer: Medicare Other | Admitting: Adult Health

## 2016-05-20 ENCOUNTER — Encounter: Payer: Self-pay | Admitting: Adult Health

## 2016-05-20 VITALS — BP 102/70 | HR 71 | Ht 70.0 in | Wt 155.0 lb

## 2016-05-20 DIAGNOSIS — I255 Ischemic cardiomyopathy: Secondary | ICD-10-CM

## 2016-05-20 DIAGNOSIS — F0391 Unspecified dementia with behavioral disturbance: Secondary | ICD-10-CM

## 2016-05-20 DIAGNOSIS — I10 Essential (primary) hypertension: Secondary | ICD-10-CM | POA: Diagnosis not present

## 2016-05-20 DIAGNOSIS — I5032 Chronic diastolic (congestive) heart failure: Secondary | ICD-10-CM | POA: Diagnosis not present

## 2016-05-20 DIAGNOSIS — D649 Anemia, unspecified: Secondary | ICD-10-CM | POA: Diagnosis not present

## 2016-05-20 MED ORDER — FUROSEMIDE 20 MG PO TABS: 20.0000 mg | ORAL_TABLET | Freq: Every day | ORAL | 0 refills | Status: DC

## 2016-05-20 NOTE — Progress Notes (Signed)
Cardiology Office Note   Date:  05/20/2016   ID:  [name], DOB 09/28/1926, MRN [id number]  PCP:  [name], MD  Cardiologist: [name], NP   No chief complaint on file.     History of Present Illness: [name] is a 82 y.o. male who presents for ongoing assessment and management of chronic systolic heart failure with LVEF of 55% to 60%, atrial fibrillation, not on anticoagulation due to history of GI bleed, intolerant to beta blockers. He was last seen in the office in July of 2017 after having TURP.  On last office visit the patient continued to have weakness, fatigue, and frailty.I expressed my concerns about his overall health and prognosis especially as his wife is also frail and has medical issues of her own. I had discussed on multiple occasions about moving to assisted living but they continued to refuse. His wife is feeling very burdened by care of her husband and her own health issues. They continued to refuse placement.  He comes today feeling week and tired. Easily tired, with chronic pain. His wife, who is frail as well is finding him to be more confused and states that he is having incontinent urination.She believes is dementia is worsening.  He states he doesn't feel well.   Past Medical History:  Diagnosis Date  . A-fib ([place])   . Alzheimer disease   . Arthritis   . Asymptomatic carotid artery stenosis    BILATERAL MILD ICA---  <50% PER DUPLEX 03-08-2008  . BPH (benign prostatic hypertrophy)   . Cancer ([place])   . Cardiac pacemaker    CARDIOLOGIST-- DR Cristopher Peru (LAST PACE [facility] 05-15-2013)  . CHB (complete heart block) ([place])   . Chronic back pain   . Chronic pancreatitis ([place])    Based on CT findings  . Chronic systolic heart failure ([place])   . Costochondritis, acute   . Dementia   . Erectile dysfunction   . Glaucoma   . History of melanoma in situ    JAN 2012--  LEFT HEEL W/ SLN BX  . History of syncope   . HOH (hard of hearing)    . Hypertension   . IC (interstitial cystitis)   . Nonischemic cardiomyopathy ([place])   . Pancytopenia   . Poor historian   . Rash and other nonspecific skin eruption   . Severe mitral regurgitation   . Urge urinary incontinence   . Visual changes     Past Surgical History:  Procedure Laterality Date  . BACK SURGERY  X3  LAST ONE'90's  . CATARACT EXTRACTION, BILATERAL  left  1997/   right 2003  . COLONOSCOPY  08/18/2006   [id number] granularity and erosions of the rectum of uncertain significance biopsied.  A long redundant, but otherwise normal-appearing colon.melanosi coli and minimal inflammation on bx  . COLONOSCOPY N/A 08/05/2014   Procedure: COLONOSCOPY;  Surgeon: [name], MD;  [facility];  Service: Endoscopy;  Laterality: N/A;  PT NEEDS TCS AT 1000.  Marland Kitchen CYSTO/ HOD/  REPLACEMENT INTERSTIM IMPLANT  05-14-2003  . CYSTOSCOPY W/ RETROGRADES N/A 02/11/2016   Procedure: CYSTOSCOPY WITH RETROGRADE PYELOGRAM;  Surgeon: Cleon Gustin, MD;  Location: [facility] ORS;  Service: Urology;  Laterality: N/A;  . ESOPHAGOGASTRODUODENOSCOPY N/A 03/28/2014   Dr. [name]: normal esophagus s/p dilation, mosaic appearance - chronic inflammation noted on bx  . IMPLANTABLE CARDIOVERTER DEFIBRILLATOR (ICD) GENERATOR CHANGE N/A 10/11/2014   Procedure: ICD GENERATOR CHANGE;  Surgeon: [name],  MD;  Location: [facility] CATH LAB;  Service: Cardiovascular;  Laterality: N/A;  . INGUINAL HERNIA REPAIR Bilateral AS TEEN  . INTERSTIM IMPLANT PLACEMENT  2001  &  2007  . INTERSTIM IMPLANT REVISION N/A 08/28/2013   Procedure: REPLACEMENT OF INTERSTIM-GENERATOR AND LEAD ;  Surgeon: [name], MD;  Location: [place];  Service: Urology;  Laterality: N/A;  . LEFT ELBOW SURGERY  2002  . MALONEY DILATION N/A 03/28/2014   Procedure: Venia Minks DILATION;  Surgeon: [name], MD;  [facility];  Service: Endoscopy;  Laterality: N/A;  . PACEMAKER INSERTION  12-06-2008  DR Carleene Overlie  [place]   BiV PPM  --  [employer]  . REMOVAL INTERSTIM IMPLANT/  CYST/  HOD  04-14-2004  . REVISION INTERSTIM IMPLANT AND REPLACE GENERATOR  05-15-2009   left upper buttock for urge urinary incontinence  . SAVORY DILATION N/A 03/28/2014   Procedure: SAVORY DILATION;  Surgeon: [name], MD;  [facility];  Service: Endoscopy;  Laterality: N/A;  . TONSILLECTOMY  1950  . TRANSTHORACIC ECHOCARDIOGRAM  12-06-2008   LVSF 55-60%/  MODERATE MR/  MILD AR/  QUESTION DISTAL POSTERIOR HYPOKINESIS  . TRANSURETHRAL RESECTION OF BLADDER TUMOR WITH GYRUS (TURBT-GYRUS) N/A 02/11/2016   Procedure: TRANSURETHRAL RESECTION OF BLADDER TUMOR WITH GYRUS (TURBT-GYRUS);  Surgeon: Cleon Gustin, MD;  Location: [facility];  Service: Urology;  Laterality: N/A;  . WIDE EXCISION LEFT HEEL AND SLN BX  JUNE 2012     Current Outpatient Prescriptions  Medication Sig Dispense Refill  . acetaminophen (TYLENOL) 650 MG CR tablet Take 650 mg by mouth every 8 (eight) hours as needed.    Marland Kitchen aspirin EC 81 MG EC tablet Take 1 tablet (81 mg total) by mouth daily.    . busPIRone (BUSPAR) 5 MG tablet One tablet once daily for anxiety 30 tablet 2  . Carboxymethylcellul-Glycerin 0.5-0.9 % SOLN Apply to eye daily. One drop both eyes    . diclofenac sodium (VOLTAREN) 1 % GEL Apply 2 g topically daily as needed (pain). Reported on 01/05/2016    . dorzolamide (TRUSOPT) 2 % ophthalmic solution 1 drop daily. One drop both eyes    . fentaNYL (DURAGESIC - DOSED MCG/HR) 25 MCG/HR patch Place 1 patch (25 mcg total) onto the skin every 3 (three) days. 10 patch 0  . ibuprofen (ADVIL,MOTRIN) 200 MG tablet Take 400 mg by mouth every 6 (six) hours as needed for moderate pain.    Marland Kitchen latanoprost (XALATAN) 0.005 % ophthalmic solution 1 drop at bedtime. Both eyes    . Meth-Hyo-M Bl-Na Phos-Ph Sal (URIBEL) 118 MG CAPS Take 1 capsule (118 mg total) by mouth 2 (two) times daily as needed (bladder). 30 capsule 0  . polyethylene glycol powder  (GLYCOLAX/MIRALAX) powder Take 17 g by mouth daily. 3350 g 1  . furosemide (LASIX) 20 MG tablet Take 1 tablet (20 mg total) by mouth daily. May take an additional 20 mg in the afternoon AS NEEDED 90 tablet 0   No current facility-administered medications for this visit.     Allergies:   Codeine and Penicillins    Social History:  The patient  reports that he quit smoking about 18 years ago. His smoking use included Cigarettes. He has a 0.60 pack-year smoking history. He has never used smokeless tobacco. He reports that he does not drink alcohol or use drugs.   Family History:  The patient's family history includes Cancer (age of onset: 48) in his father;  Dementia in his mother; Heart disease in his brother; Lung cancer in his sister; Throat cancer in his father.    ROS: All other systems are reviewed and negative. Unless otherwise mentioned in H&P    PHYSICAL EXAM: VS:  BP 102/70   Pulse 71   Ht 5\' 10"  (1.778 m)   Wt 155 lb (70.3 kg)   BMI 22.24 kg/m  , BMI Body mass index is 22.24 kg/m. GEN: Well nourished, well developed, in no acute distress  HEENT: normal  Neck: no JVD, carotid bruits, or masses Cardiac: RRR; 2/6 holosystolic murmur at the apex, no rubs, or gallops,no edema  Respiratory: Clear to auscultation bilaterally, normal work of breathing GI: soft, nontender, nondistended, + BS MS: no deformity or atrophy  Skin: warm and dry, no rash Neuro:  Strength and sensation are intact Psych: euthymic mood, full affect  Recent Labs: 02/14/2016: ALT 18 04/05/2016: Hemoglobin 9.6; Platelets 90 05/10/2016: Brain Natriuretic Peptide 1,122.3; BUN 22; Creat 1.08; Potassium 3.8; Sodium 144; TSH 4.23    Lipid Panel    Component Value Date/Time   CHOL 179 06/19/2013 1241   TRIG 66 06/19/2013 1241   HDL 51 06/19/2013 1241   CHOLHDL 3.5 06/19/2013 1241   VLDL 13 06/19/2013 1241   LDLCALC 115 (H) 06/19/2013 1241      Wt Readings from Last 3 Encounters:  05/20/16 155 lb (70.3  kg)  05/12/16 156 lb (70.8 kg)  05/10/16 158 lb (71.7 kg)     ASSESSMENT AND PLAN:  1. Chronic diastolic CHF:  He does not appear to be decompensated, but he is overall very weak. O2 Sats are 91 % on R/A. He has generalized pain. I will check a CBC as review of last lab demonstrated decreased Hgb to 9.8 in July. I have decreased his lasix to 20 mg in the am and prn in the pm.   2. Hypertension: His BP is soft. Will discontinue amlodipine for now.   3. Worsening Dementia: Will rule out anemia causing worsening mentation. He and his wife are again talked to about SNF placement for this patient and/or Assisted living. His wife is now more inclined to agree to this now. She actually fell down some stairs and injured herself this week. She is "exhausted" taking care of him. She is advised to call Dr. Moshe Cipro to set up a social services appointment and bring her daughter with her to help to discuss this    Current medicines are reviewed at length with the patient today.    Labs/ tests ordered today include:   Orders Placed This Encounter  Procedures  . CBC with Differential     Disposition:   FU with cardiology in one month.  Signed, Jory Sims, NP  05/20/2016 5:04 PM    [facility] [address] [place], [place] [address] Phone: [phone]; Fax: [phone]

## 2016-05-20 NOTE — Progress Notes (Signed)
Name: [name]    DOB: [birth year]  Age: 82 y.o.  MR#: [id number]       PCP:  [name], MD      Insurance: Payor: MEDICARE / Plan: MEDICARE PART A AND B / Product Type: *No Product type* /   CC:   No chief complaint on file.   VS Vitals:   05/20/16 1536  BP: 102/70  Pulse: 71  Weight: 155 lb (70.3 kg)  Height: 5\' 10"  (1.778 m)    Weights Current Weight  05/20/16 155 lb (70.3 kg)  05/12/16 156 lb (70.8 kg)  05/10/16 158 lb (71.7 kg)    Blood Pressure  BP Readings from Last 3 Encounters:  05/20/16 102/70  05/12/16 104/70  05/10/16 122/80     Admit date:  (Not on file) Last encounter with RMR:  04/09/2016   Allergy Codeine and Penicillins  Current Outpatient Prescriptions  Medication Sig Dispense Refill  . acetaminophen (TYLENOL) 650 MG CR tablet Take 650 mg by mouth every 8 (eight) hours as needed.    Marland Kitchen amLODipine (NORVASC) 2.5 MG tablet TAKE 1 TABLET (2.5 MG TOTAL) BY MOUTH DAILY. 30 tablet 3  . aspirin EC 81 MG EC tablet Take 1 tablet (81 mg total) by mouth daily.    . busPIRone (BUSPAR) 5 MG tablet One tablet once daily for anxiety 30 tablet 2  . Carboxymethylcellul-Glycerin 0.5-0.9 % SOLN Apply to eye daily. One drop both eyes    . diclofenac sodium (VOLTAREN) 1 % GEL Apply 2 g topically daily as needed (pain). Reported on 01/05/2016    . dorzolamide (TRUSOPT) 2 % ophthalmic solution 1 drop daily. One drop both eyes    . fentaNYL (DURAGESIC - DOSED MCG/HR) 25 MCG/HR patch Place 1 patch (25 mcg total) onto the skin every 3 (three) days. 10 patch 0  . furosemide (LASIX) 20 MG tablet Take 1 tablet (20 mg total) by mouth 2 (two) times daily. Take in the AM and At 4 PM 180 tablet 3  . ibuprofen (ADVIL,MOTRIN) 200 MG tablet Take 400 mg by mouth every 6 (six) hours as needed for moderate pain.    Marland Kitchen latanoprost (XALATAN) 0.005 % ophthalmic solution 1 drop at bedtime. Both eyes    . Meth-Hyo-M Bl-Na Phos-Ph Sal (URIBEL) 118 MG CAPS Take 1 capsule (118 mg total) by mouth 2  (two) times daily as needed (bladder). 30 capsule 0  . polyethylene glycol powder (GLYCOLAX/MIRALAX) powder Take 17 g by mouth daily. 3350 g 1   No current facility-administered medications for this visit.     Discontinued Meds:   There are no discontinued medications.  Patient Active Problem List   Diagnosis Date Noted  . Generalized anxiety disorder 04/05/2016  . Pigmented skin lesion 04/05/2016  . Acute CHF (congestive heart failure) ([place]) 03/15/2016  . Mitral regurgitation 03/15/2016  . Tricuspid regurgitation 03/15/2016  . Pulmonary hypertension ([place]) 10/01/2015  . Fatigue 10/01/2015  . Chronic back pain 10/01/2015  . Myelodysplasia 06/20/2015  . Dyspnea 02/09/2015  . Recurrent falls 08/01/2014  . Atrial fibrillation, persistent ([place]) 02/22/2014  . Bilateral leg weakness 05/01/2013  . Abnormal gait 06/12/2012  . GERD (gastroesophageal reflux disease) 05/09/2012  . Alzheimer's dementia 11/17/2011  . Backache 02/18/2010  . GEN OSTEOARTHROSIS INVOLVING MULTIPLE SITES 11/25/2009  . CENTRAL HEARING LOSS 11/10/2009  . Biventricular cardiac pacemaker in situ 07/22/2009  . INTERSTITIAL CYSTITIS 07/14/2009  . Pancytopenia ([place]) 12/31/2008  . MITRAL REGURGITATION, MODERATE 12/31/2008  . Chronic systolic heart failure ([place])  12/31/2008  . ERECTILE DYSFUNCTION 01/31/2008  . Glaucoma 01/31/2008  . Essential hypertension 01/31/2008    LABS    Component Value Date/Time   NA 144 05/10/2016 1156   NA 142 04/09/2016 1627   NA 146 04/05/2016 1421   K 3.8 05/10/2016 1156   K 3.2 (L) 04/09/2016 1627   K 3.7 04/05/2016 1421   CL 107 05/10/2016 1156   CL 109 04/09/2016 1627   CL 108 04/05/2016 1421   CO2 28 05/10/2016 1156   CO2 27 04/09/2016 1627   CO2 26 04/05/2016 1421   GLUCOSE 115 (H) 05/10/2016 1156   GLUCOSE 101 (H) 04/09/2016 1627   GLUCOSE 95 04/05/2016 1421   BUN 22 05/10/2016 1156   BUN 30 (H) 04/09/2016 1627   BUN 27 (H) 04/05/2016 1421   CREATININE 1.08  05/10/2016 1156   CREATININE 1.13 04/09/2016 1627   CREATININE 1.01 04/05/2016 1421   CREATININE 1.00 03/16/2016 0400   CREATININE 1.20 03/15/2016 1143   CREATININE 0.94 10/01/2015 1653   CALCIUM 8.7 05/10/2016 1156   CALCIUM 8.4 (L) 04/09/2016 1627   CALCIUM 8.5 (L) 04/05/2016 1421   GFRNONAA 56 (L) 04/09/2016 1627   GFRNONAA >60 03/16/2016 0400   GFRNONAA 53 (L) 03/15/2016 1143   GFRAA >60 04/09/2016 1627   GFRAA >60 03/16/2016 0400   GFRAA >60 03/15/2016 1143   CMP     Component Value Date/Time   NA 144 05/10/2016 1156   K 3.8 05/10/2016 1156   CL 107 05/10/2016 1156   CO2 28 05/10/2016 1156   GLUCOSE 115 (H) 05/10/2016 1156   BUN 22 05/10/2016 1156   CREATININE 1.08 05/10/2016 1156   CALCIUM 8.7 05/10/2016 1156   PROT 6.5 02/14/2016 1542   ALBUMIN 3.5 02/14/2016 1542   AST 26 02/14/2016 1542   ALT 18 02/14/2016 1542   ALKPHOS 58 02/14/2016 1542   BILITOT 2.5 (H) 02/14/2016 1542   GFRNONAA 56 (L) 04/09/2016 1627   GFRAA >60 04/09/2016 1627       Component Value Date/Time   WBC 2.7 (L) 04/05/2016 1421   WBC 2.5 (L) 03/15/2016 1143   WBC 2.8 (L) 02/19/2016 0628   HGB 9.6 (L) 04/05/2016 1421   HGB 10.5 (L) 03/15/2016 1143   HGB 10.9 (L) 03/01/2016 1952   HCT 29.5 (L) 04/05/2016 1421   HCT 30.9 (L) 03/15/2016 1143   HCT 32.0 (L) 03/01/2016 1952   MCV 99.0 04/05/2016 1421   MCV 99.0 03/15/2016 1143   MCV 96.9 02/19/2016 0628    Lipid Panel     Component Value Date/Time   CHOL 179 06/19/2013 1241   TRIG 66 06/19/2013 1241   HDL 51 06/19/2013 1241   CHOLHDL 3.5 06/19/2013 1241   VLDL 13 06/19/2013 1241   LDLCALC 115 (H) 06/19/2013 1241    ABG    Component Value Date/Time   TCO2 26 03/01/2016 1952     Lab Results  Component Value Date   TSH 4.23 05/10/2016   BNP (last 3 results)  Recent Labs  10/01/15 1653 05/10/16 1156  BNP 583.4* 1,122.3*    ProBNP (last 3 results) No results for input(s): PROBNP in the last 8760 hours.  Cardiac Panel  (last 3 results) No results for input(s): CKTOTAL, CKMB, TROPONINI, RELINDX in the last 72 hours.  Iron/TIBC/Ferritin/ %Sat    Component Value Date/Time   IRON 78 12/29/2015 1313   TIBC 308 12/29/2015 1313   FERRITIN 60 12/29/2015 1313   IRONPCTSAT 25 12/29/2015 1313  EKG Orders placed or performed during the hospital encounter of 03/15/16  . EKG 12-Lead  . EKG 12-Lead  . EKG     Prior Assessment and Plan Problem List as of 05/20/2016 Reviewed: 05/16/2016 10:00 PM by [name], MD     Cardiovascular and Mediastinum   MITRAL REGURGITATION, MODERATE   Last Assessment & Plan 08/08/2014 Office Visit Written 08/08/2014  4:06 PM by Lendon Colonel, NP    No apparent for repair or surgery at this time. Multiple comorbidities. Continue medical management.      Essential hypertension   Last Assessment & Plan 05/10/2016 Office Visit Written 05/16/2016  9:57 PM by Fayrene Helper, MD    Controlled, no change in medication       Chronic systolic heart failure [facility])   Last Assessment & Plan 02/20/2016 Office Visit Written 02/20/2016  3:36 PM by Fayrene Helper, MD    Increased shortness of breath  noted in pt with established heart failure and possible nocturnal wheezuing, needs overnight pulse oximetry, will refer      Atrial fibrillation, persistent [facility])   Last Assessment & Plan 10/01/2015 Office Visit Written 10/01/2015  3:45 PM by Erlene Quan, PA-C    Unable to tolerate anticoagulation secondary to GI bleeding CHADs VASc=3      Pulmonary hypertension [facility])   Last Assessment & Plan 02/20/2016 Office Visit Written 02/20/2016  3:43 PM by Fayrene Helper, MD    Shortness of breath and wheezing noted a at night increasingly by spouse, refer for overnight pulse oximetry      Acute CHF (congestive heart failure) [facility])   Last Assessment & Plan 05/10/2016 Office Visit Written 05/16/2016  9:56 PM by Fayrene Helper, MD    Acute flare of symptoms this past weekend,  reviewed with pt and spouse the importance of taking medication as prescribed,  Still not consistently taking his diuretic Will check BNP today based on symptom severity, reports he "felt like I was leaving here" and wife states she also gave him a NTG May need sooner cardiology appointment, will follow through      Mitral regurgitation   Tricuspid regurgitation     Digestive   GERD (gastroesophageal reflux disease)   Last Assessment & Plan 05/10/2016 Office Visit Written 05/16/2016 10:01 PM by Fayrene Helper, MD    Controlled, no change in medication         Nervous and [place] 12/28/2013 Office Visit Written 12/29/2013  9:11 AM by Fayrene Helper, MD    needs hearing aids, will advise checking belltone  For help with this      Alzheimer's dementia   Last Assessment & Plan 05/10/2016 Office Visit Written 05/16/2016  9:59 PM by Fayrene Helper, MD    Stable off of medication      Bilateral leg weakness   Last Assessment & Plan 09/09/2014 Office Visit Written 02/02/2015 10:59 AM by Fayrene Helper, MD    Improved with recent in house therapy, he is to continue out pt in home PT/OT  For next approx 4 weeks      Myelodysplasia   Last Assessment & Plan 11/03/2015 Office Visit Edited 11/03/2015  3:28 PM by Baird Cancer, PA-C    Pancytopenia in the setting of a bone marrow aspiration and biopsy on 0000000 by Dr. Sterling Big at [facility] demonstrating MDS with 22q deletion, normocellular marrow at 25% without evidence  of melanoma, lymphoma, or morphologic evidence of MDS.  However, decreased iron stores were noted.  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  In November, his ferritin was less than 100 and his calculated iron deficit was approximately 370 mg.  He was therefore given ferric gluconate 125 mg x 2.   Oncology Flowsheet 09/08/2015 09/15/2015  ferric gluconate  (NULECIT) IV 125 mg 125 mg    At this time, there is no need for ESA therapy.  However, if he anemia progresses, we could absolutely consider ESA treatment.  His HGB is stable without any significant change.  Leukopenia is noted.  No recent infections or hospitalizations associated with infections.  If this becomes an issue GSF support can be considered.  Not necessary now.  Stable.  Neutrophil count is stable without any major changes.  Re-dressing of right upper arm completed by myself and nursing.  Cutting of scab from Band-Aid was completed.  New dressing placed and recommended he keep dressing in place for 48 hours.  Labs in 8 weeks: CBC diff, CMET, iron/TIBC, ferritin.  Return in 8 weeks for follow-up.  Chart is reviewed.  I agree with SNF placement and will defer to primary care provider.        Musculoskeletal and Integument   GEN OSTEOARTHROSIS INVOLVING MULTIPLE SITES   Last Assessment & Plan 06/12/2015 Office Visit Written 07/09/2015  8:31 PM by Fayrene Helper, MD    Continue current medication      Pigmented skin lesion   Last Assessment & Plan 04/05/2016 Office Visit Written 04/10/2016 10:40 PM by Fayrene Helper, MD    Hyperpigmented lesion under rigth 4th fingernail, increasing in size, also anterior chest lesion with varied pigmentation. Pt has h/o melanoma in past  Years. Refer to dermatology for skin evaluation        Genitourinary   INTERSTITIAL CYSTITIS   Last Assessment & Plan 06/12/2015 Office Visit Written 07/09/2015  8:36 PM by Fayrene Helper, MD    Followed by urology has implanted device as well as is on medication for symptoms, still c/o excessive nocturia and wants to stop his diuretic, the importance of maintaining medications as prescribed for heart failure protection is again discussed        Hematopoietic and Hemostatic   Pancytopenia [facility])   Last Assessment & Plan 05/10/2016 Office Visit Written 05/16/2016 10:01 PM by Fayrene Helper, MD     Unchanged, has ahd hematology eval in the past on several occasions        Other   ERECTILE DYSFUNCTION   Last Assessment & Plan 05/10/2011 Office Visit Written 05/10/2011 10:00 AM by Fayrene Helper, MD    Pt states that the cialis is not working as well, he still has a lot, advised him to discuss further with his urologist      Glaucoma   Last Assessment & Plan 02/25/2015 Office Visit Written 04/04/2015 11:06 PM by Fayrene Helper, MD    Followed by opthalmology and compliant with drops      Backache   Last Assessment & Plan 05/10/2016 Office Visit Written 05/16/2016  9:58 PM by Fayrene Helper, MD    Chronic and unchanged, managed in conjunction with orhtopedics      Biventricular cardiac pacemaker in situ   Last Assessment & Plan 11/12/2015 Office Visit Written 11/17/2015  2:09 PM by Fayrene Helper, MD    Current proposal per spouse is  that she learn how to monitor from home, she is overwhelmed ans states she is incapable wants an alternative arrangement, requests [facility] help again      Abnormal gait   Last Assessment & Plan 11/12/2015 Office Visit Written 11/17/2015  2:01 PM by Fayrene Helper, MD    High fall risk and incapable of self care , will refer to [facility] for evaluation for any possible assistance, he lives with his spouse who is also aged and less capable of independent living, however , they both still prefer to live in their own home together at this time      Recurrent falls   Last Assessment & Plan 02/20/2016 Office Visit Written 02/20/2016  3:42 PM by Fayrene Helper, MD    Increased weakness , recurrent falls, spouse states she is now ready to get in home health twice weekly, will ask for [facility] help  To navigate the process      Dyspnea   Last Assessment & Plan 01/06/2015 Office Visit Written 02/09/2015  5:28 PM by Fayrene Helper, MD    Recent increase with worsened  exercise tolerance and recurrent falls, pt sent to [facility] for further eval      Fatigue   Last  Assessment & Plan 10/01/2015 Office Visit Edited 10/01/2015  4:01 PM by Erlene Quan, PA-C    His main complaint today is fatigue x 1 week. "just feel run down"      Chronic back pain   Last Assessment & Plan 04/05/2016 Office Visit Edited 04/10/2016 10:36 PM by Fayrene Helper, MD    Reports poor control on current regime, manged by ortho who he sees regularly, and he also is on fentanyl patch. No evidence of uncontrolled pain and discomfort at office visits      Generalized anxiety disorder   Last Assessment & Plan 04/05/2016 Office Visit Written 04/10/2016 10:34 PM by Fayrene Helper, MD    Uncontrolled , requests medication "like his wife's", low dose buspar prescribed          Imaging: No results found.

## 2016-05-20 NOTE — Patient Instructions (Signed)
Medication Instructions:  STOP AMLODIPINE  DECEASE LASIX TO 20 MG DAILY ( YOU MAY TAKE AN ADDITIONAL 20 MG IN THE AFTERNOON  AS NEEDED)   Labwork: Your physician recommends that you return for lab work in: ASAP CBC   Testing/Procedures: NONE  Follow-Up: Your physician recommends that you schedule a follow-up appointment in: TO BE DETERMINED    Any Other Special Instructions Will Be Listed Below (If Applicable).  PLEASE SCHEDULE AN APPOINTMENT WITH DR. Moshe Cipro NEXT WEEK AND BE SURE TO TAKE YOUR DAUGHTER WITH YOU    If you need a refill on your cardiac medications before your next appointment, please call your pharmacy.

## 2016-05-21 LAB — CBC WITH DIFFERENTIAL/PLATELET
BASOS PCT: 0 %
Basophils Absolute: 0 cells/uL (ref 0–200)
EOS PCT: 2 %
Eosinophils Absolute: 56 cells/uL (ref 15–500)
HEMATOCRIT: 31.4 % — AB (ref 38.5–50.0)
Hemoglobin: 10.3 g/dL — ABNORMAL LOW (ref 13.2–17.1)
LYMPHS ABS: 420 {cells}/uL — AB (ref 850–3900)
LYMPHS PCT: 15 %
MCH: 32.8 pg (ref 27.0–33.0)
MCHC: 32.8 g/dL (ref 32.0–36.0)
MCV: 100 fL (ref 80.0–100.0)
MONO ABS: 336 {cells}/uL (ref 200–950)
MPV: 10.5 fL (ref 7.5–12.5)
Monocytes Relative: 12 %
NEUTROS PCT: 71 %
Neutro Abs: 1988 cells/uL (ref 1500–7800)
Platelets: 87 10*3/uL — ABNORMAL LOW (ref 140–400)
RBC: 3.14 MIL/uL — AB (ref 4.20–5.80)
RDW: 14.9 % (ref 11.0–15.0)
WBC: 2.8 10*3/uL — AB (ref 3.8–10.8)

## 2016-05-27 ENCOUNTER — Other Ambulatory Visit: Payer: Self-pay

## 2016-05-27 MED ORDER — FENTANYL 25 MCG/HR TD PT72: 25.0000 ug | MEDICATED_PATCH | TRANSDERMAL | 0 refills | Status: DC

## 2016-06-01 ENCOUNTER — Ambulatory Visit: Payer: Self-pay | Admitting: Family Medicine

## 2016-06-09 ENCOUNTER — Other Ambulatory Visit: Payer: Self-pay

## 2016-06-09 MED ORDER — FENTANYL 25 MCG/HR TD PT72
25.0000 ug | MEDICATED_PATCH | TRANSDERMAL | 0 refills | Status: DC
Start: 2016-06-09 — End: 2016-09-17

## 2016-06-10 ENCOUNTER — Ambulatory Visit (INDEPENDENT_AMBULATORY_CARE_PROVIDER_SITE_OTHER): Payer: Self-pay | Admitting: Otolaryngology

## 2016-06-11 ENCOUNTER — Other Ambulatory Visit: Payer: Self-pay | Admitting: *Deleted

## 2016-06-11 NOTE — Patient Outreach (Signed)
[facility]) Care Management   06/11/2016  [name] 1925-10-22 [id number]  [name] is an 82 y.o. male  Subjective:  Spouse reports pt had an episode recently where he was up during the night, pacing, Confused. Spouse reports pt better now.  Spouse reports pt is not weighing, was told to give pt an extra Lasix if needed- watch for swelling in his feet.  Pt reports sob at times.   Spouse shared with RN CM she does not want pt to go to a nursing home, has friends lined up to help her if needed.    Objective:   Vitals:   06/11/16 1106  BP: 108/84  Pulse: 66  Resp: (!) 24    ROS  Physical Exam  Constitutional: He is oriented to person, place, and time. He appears well-developed and well-nourished.  Cardiovascular: Normal rate, regular rhythm and normal heart sounds.   Respiratory: Effort normal and breath sounds normal.  GI: Soft. Bowel sounds are normal.  Musculoskeletal: Normal range of motion. He exhibits edema.  Trace to +1 edema right ankle/top of foot.    Neurological: He is alert and oriented to person, place, and time.  Skin: Skin is warm and dry.  Psychiatric: He has a normal mood and affect. His behavior is normal. Thought content normal.    Encounter Medications:   Outpatient Encounter Prescriptions as of 06/11/2016  Medication Sig Note  . aspirin EC 81 MG EC tablet Take 1 tablet (81 mg total) by mouth daily. 04/07/2016: Spouse to get, start giving to pt.   . busPIRone (BUSPAR) 5 MG tablet One tablet once daily for anxiety 06/11/2016: Per spouse, only as needed.   . diclofenac sodium (VOLTAREN) 1 % GEL Apply 2 g topically daily as needed (pain). Reported on 01/05/2016 03/18/2016: As needed.   . dorzolamide (TRUSOPT) 2 % ophthalmic solution 1 drop daily. One drop both eyes   . ENSURE (ENSURE) Take 237 mLs by mouth. Pt takes daily   . fentaNYL (DURAGESIC - DOSED MCG/HR) 25 MCG/HR patch Place 1 patch (25 mcg total) onto the skin every 3 (three) days.   .  furosemide (LASIX) 20 MG tablet Take 1 tablet (20 mg total) by mouth daily. May take an additional 20 mg in the afternoon AS NEEDED   . latanoprost (XALATAN) 0.005 % ophthalmic solution 1 drop at bedtime. Both eyes   . Meth-Hyo-M Bl-Na Phos-Ph Sal (URIBEL) 118 MG CAPS Take 1 capsule (118 mg total) by mouth 2 (two) times daily as needed (bladder). 06/11/2016: Pt taking once a day   . Multiple Vitamins-Minerals (CENTRUM SILVER 50+MEN) TABS Take by mouth. Daily   . polyethylene glycol powder (GLYCOLAX/MIRALAX) powder Take 17 g by mouth daily. 05/12/2016: As needed.   Marland Kitchen acetaminophen (TYLENOL) 650 MG CR tablet Take 650 mg by mouth every 8 (eight) hours as needed.   . Carboxymethylcellul-Glycerin 0.5-0.9 % SOLN Apply to eye daily. One drop both eyes   . ibuprofen (ADVIL,MOTRIN) 200 MG tablet Take 400 mg by mouth every 6 (six) hours as needed for moderate pain. 05/12/2016: As needed.    No facility-administered encounter medications on file as of 06/11/2016.     Functional Status:   In your present state of health, do you have any difficulty performing the following activities: 03/15/2016 02/06/2016  Hearing? N Y  Vision? N Y  Difficulty concentrating or making decisions? [name]  Walking or climbing stairs? Y N  Dressing or bathing? N N  Doing errands, shopping?  N N  Using the Toilet? - -  In the past six months, have you accidently leaked urine? - -  Do you have problems with loss of bowel control? - -  Managing your Medications? - -  Managing your Finances? - -  Housekeeping or managing your Housekeeping? - -  Some recent data might be hidden    Fall/Depression Screening:    PHQ 2/9 Scores 01/05/2016 11/25/2015 04/11/2015 01/14/2015 01/14/2015 04/25/2013  PHQ - 2 Score 1 1 0 0 1 0    Assessment:  82 year old gentleman, no c/o pain today.  Spouse did most of talking                         Today.  Pt appeared tired.                         HF- lungs clear, trace to +1 edema right ankle/top of foot.     Plan:   Spouse to continue to monitor pt for sob, edema- give extra Lasix as needed.             Plan to continue to provide pt with community nurse case management services,                Follow up again next month- home visit, if stable will discharge.   THN CM Care Plan Problem One   Flowsheet Row Most Recent Value  Care Plan Problem One  Better management  of HF   Role Documenting the Problem One  Care Management Coordinator  Care Plan for Problem One  Active  THN Long Term Goal (31-90 days)  Spouse would assist pt to better manage HF in the next 60 days   THN Long Term Goal Start Date  05/12/16  Interventions for Problem One Long Term Goal  Reinforced with spouse importance of monitoring for edema, sob.   THN CM Short Term Goal #1 (0-30 days)  Pt would take his fluid pill as ordered for the next 30 days   THN CM Short Term Goal #1 Met Date  06/11/16 [per spouse, taking as ordered. ]  THN CM Short Term Goal #2 (0-30 days)  Pt would take extra fluid medication as needed.    THN CM Short Term Goal #2 Start Date  06/11/16  Interventions for Short Term Goal #2  Discussed with spouse to give pt extra Lasix as ordered by MD for increased swelling, sob.   THN CM Short Term Goal #3 (0-30 days)  Pt would switch to Mrs. Dash within the next 30 days   THN CM Short Term Goal #3 Start Date  06/11/16 Donivan Scull established ]  Interventions for Short Tern Goal #3  Reviewed with pt's spouse benefit of switching to Mrs Deliah Boston.      [name].   Heath Care Management  [phone]

## 2016-06-12 ENCOUNTER — Other Ambulatory Visit: Payer: Self-pay | Admitting: Family Medicine

## 2016-06-12 DIAGNOSIS — F411 Generalized anxiety disorder: Secondary | ICD-10-CM

## 2016-06-15 ENCOUNTER — Telehealth: Payer: Self-pay | Admitting: Family Medicine

## 2016-06-15 NOTE — Telephone Encounter (Signed)
Never Mind Brandi, We explained to Mrs. [name] that [employer] would not pay for portable tank

## 2016-06-15 NOTE — Telephone Encounter (Signed)
[name], can you please call Guy Franco and let her know where Tyres gets his protable oxygen tank from, please advise?

## 2016-06-16 ENCOUNTER — Ambulatory Visit (INDEPENDENT_AMBULATORY_CARE_PROVIDER_SITE_OTHER): Payer: Medicare Other | Admitting: Orthopaedic Surgery

## 2016-06-16 ENCOUNTER — Encounter: Payer: Self-pay | Admitting: Orthopaedic Surgery

## 2016-06-16 VITALS — BP 128/84 | HR 72 | Temp 97.9°F | Ht 70.0 in | Wt 160.0 lb

## 2016-06-16 DIAGNOSIS — M25561 Pain in right knee: Secondary | ICD-10-CM | POA: Diagnosis not present

## 2016-06-16 NOTE — Progress Notes (Signed)
CC:  I have pain of my right knee. I would like an injection.  The patient has chronic pain of the right knee.  There is no recent trauma.  There is no redness.  Injections in the past have helped.  The knee has no redness, has an effusion and crepitus present.  ROM of the right knee is 0-100.  Impression:  Chronic knee pain right  Return: 3 months  PROCEDURE NOTE:  The patient requests injections of the right knee , verbal consent was obtained.  The right knee was prepped appropriately after time out was performed.   Sterile technique was observed and injection of 1 cc of Depo-Medrol 40 mg with several cc's of plain xylocaine. Anesthesia was provided by ethyl chloride and a 20-gauge needle was used to inject the knee area. The injection was tolerated well.  A band aid dressing was applied.  The patient was advised to apply ice later today and tomorrow to the injection sight as needed.  Electronically Signed Sanjuana Kava, MD 9/13/20173:33 PM

## 2016-06-18 ENCOUNTER — Ambulatory Visit: Payer: Self-pay | Admitting: Adult Health

## 2016-06-18 ENCOUNTER — Encounter: Payer: Self-pay | Admitting: Adult Health

## 2016-06-18 NOTE — Progress Notes (Deleted)
Cardiology Office Note   Date:  06/18/2016   ID:  [name], DOB [birth year], MRN [id number]  PCP:  [name], MD  Cardiologist:  [name], NP   No chief complaint on file.     History of Present Illness: [name] is a 82 y.o. male who presents for ongoing assessment and management of chronic diastolic  heart failure, LVEF of 55% to 60%, atrial fibrillation, not on anticoagulation due to history of GI bleed, intolerant to beta blockers. Patient was last seen in the office on 05/20/2016 complaining of being weak and tired. Has chronic pain. He was accompanied by his wife who is equally frail with her own chronic illnesses. On last office visit dimension appeared to be worsening and he was having frequent incontinent urination.  Follow-up labs to evaluate hemoglobin and hematocrit due to history of anemia. His Lasix was decreased to 20 mg in the a.m. and 10 mg in the p.m., blood pressure was found to be soft and amlodipine was discontinued. I discussed need for skilled nursing facility for her husband or assisted living for the both of them. She was willing to entertain this idea and was to follow-up with Dr. Moshe Cipro for planning this.    Past Medical History:  Diagnosis Date  . A-fib ([place])   . Alzheimer disease   . Arthritis   . Asymptomatic carotid artery stenosis    BILATERAL MILD ICA---  <50% PER DUPLEX 03-08-2008  . BPH (benign prostatic hypertrophy)   . Cancer ([place])   . Cardiac pacemaker    CARDIOLOGIST-- DR Cristopher Peru (LAST PACE [facility] 05-15-2013)  . CHB (complete heart block) ([place])   . Chronic back pain   . Chronic pancreatitis ([place])    Based on CT findings  . Chronic systolic heart failure ([place])   . Costochondritis, acute   . Dementia   . Erectile dysfunction   . Glaucoma   . History of melanoma in situ    JAN 2012--  LEFT HEEL W/ SLN BX  . History of syncope   . HOH (hard of hearing)   . Hypertension   . IC (interstitial cystitis)   .  Nonischemic cardiomyopathy ([place])   . Pancytopenia   . Poor historian   . Rash and other nonspecific skin eruption   . Severe mitral regurgitation   . Urge urinary incontinence   . Visual changes     Past Surgical History:  Procedure Laterality Date  . BACK SURGERY  X3  LAST ONE'90's  . CATARACT EXTRACTION, BILATERAL  left  1997/   right 2003  . COLONOSCOPY  08/18/2006   [id number] granularity and erosions of the rectum of uncertain significance biopsied.  A long redundant, but otherwise normal-appearing colon.melanosi coli and minimal inflammation on bx  . COLONOSCOPY N/A 08/05/2014   Procedure: COLONOSCOPY;  Surgeon: [name], MD;  [facility];  Service: Endoscopy;  Laterality: N/A;  PT NEEDS TCS AT 1000.  Marland Kitchen CYSTO/ HOD/  REPLACEMENT INTERSTIM IMPLANT  05-14-2003  . CYSTOSCOPY W/ RETROGRADES N/A 02/11/2016   Procedure: CYSTOSCOPY WITH RETROGRADE PYELOGRAM;  Surgeon: Cleon Gustin, MD;  Location: [facility] ORS;  Service: Urology;  Laterality: N/A;  . ESOPHAGOGASTRODUODENOSCOPY N/A 03/28/2014   Dr. [name]: normal esophagus s/p dilation, mosaic appearance - chronic inflammation noted on bx  . IMPLANTABLE CARDIOVERTER DEFIBRILLATOR (ICD) GENERATOR CHANGE N/A 10/11/2014   Procedure: ICD GENERATOR CHANGE;  Surgeon: [name], MD;  Location: [facility] CATH LAB;  Service: Cardiovascular;  Laterality: N/A;  . INGUINAL HERNIA REPAIR Bilateral AS TEEN  . INTERSTIM IMPLANT PLACEMENT  2001  &  2007  . INTERSTIM IMPLANT REVISION N/A 08/28/2013   Procedure: REPLACEMENT OF INTERSTIM-GENERATOR AND LEAD ;  Surgeon: [name], MD;  Location: [place];  Service: Urology;  Laterality: N/A;  . LEFT ELBOW SURGERY  2002  . MALONEY DILATION N/A 03/28/2014   Procedure: Venia Minks DILATION;  Surgeon: [name], MD;  [facility];  Service: Endoscopy;  Laterality: N/A;  . PACEMAKER INSERTION  12-06-2008  DR [name]   BiV PPM  --  [employer]  . REMOVAL INTERSTIM  IMPLANT/  CYST/  HOD  04-14-2004  . REVISION INTERSTIM IMPLANT AND REPLACE GENERATOR  05-15-2009   left upper buttock for urge urinary incontinence  . SAVORY DILATION N/A 03/28/2014   Procedure: SAVORY DILATION;  Surgeon: [name], MD;  [facility];  Service: Endoscopy;  Laterality: N/A;  . TONSILLECTOMY  1950  . TRANSTHORACIC ECHOCARDIOGRAM  12-06-2008   LVSF 55-60%/  MODERATE MR/  MILD AR/  QUESTION DISTAL POSTERIOR HYPOKINESIS  . TRANSURETHRAL RESECTION OF BLADDER TUMOR WITH GYRUS (TURBT-GYRUS) N/A 02/11/2016   Procedure: TRANSURETHRAL RESECTION OF BLADDER TUMOR WITH GYRUS (TURBT-GYRUS);  Surgeon: Cleon Gustin, MD;  Location: [facility];  Service: Urology;  Laterality: N/A;  . WIDE EXCISION LEFT HEEL AND SLN BX  JUNE 2012     Current Outpatient Prescriptions  Medication Sig Dispense Refill  . acetaminophen (TYLENOL) 650 MG CR tablet Take 650 mg by mouth every 8 (eight) hours as needed.    Marland Kitchen aspirin EC 81 MG EC tablet Take 1 tablet (81 mg total) by mouth daily.    . busPIRone (BUSPAR) 5 MG tablet TAKE 1 TABLET BY MOUTH EVERY DAY AS NEEDED FOR ANXIETY 30 tablet 3  . Carboxymethylcellul-Glycerin 0.5-0.9 % SOLN Apply to eye daily. One drop both eyes    . diclofenac sodium (VOLTAREN) 1 % GEL Apply 2 g topically daily as needed (pain). Reported on 01/05/2016    . dorzolamide (TRUSOPT) 2 % ophthalmic solution 1 drop daily. One drop both eyes    . ENSURE (ENSURE) Take 237 mLs by mouth. Pt takes daily    . fentaNYL (DURAGESIC - DOSED MCG/HR) 25 MCG/HR patch Place 1 patch (25 mcg total) onto the skin every 3 (three) days. 10 patch 0  . furosemide (LASIX) 20 MG tablet Take 1 tablet (20 mg total) by mouth daily. May take an additional 20 mg in the afternoon AS NEEDED 90 tablet 0  . ibuprofen (ADVIL,MOTRIN) 200 MG tablet Take 400 mg by mouth every 6 (six) hours as needed for moderate pain.    Marland Kitchen latanoprost (XALATAN) 0.005 % ophthalmic solution 1 drop at bedtime. Both eyes    .  Meth-Hyo-M Bl-Na Phos-Ph Sal (URIBEL) 118 MG CAPS Take 1 capsule (118 mg total) by mouth 2 (two) times daily as needed (bladder). 30 capsule 0  . Multiple Vitamins-Minerals (CENTRUM SILVER 50+MEN) TABS Take by mouth. Daily    . polyethylene glycol powder (GLYCOLAX/MIRALAX) powder Take 17 g by mouth daily. 3350 g 1   No current facility-administered medications for this visit.     Allergies:   Codeine and Penicillins    Social History:  The patient  reports that he quit smoking about 18 years ago. His smoking use included Cigarettes. He has a 0.60 pack-year smoking history. He has never used smokeless tobacco. He reports that he does not  drink alcohol or use drugs.   Family History:  The patient's family history includes Cancer (age of onset: 53) in his father; Dementia in his mother; Heart disease in his brother; Lung cancer in his sister; Throat cancer in his father.    ROS: All other systems are reviewed and negative. Unless otherwise mentioned in H&P    PHYSICAL EXAM: VS:  There were no vitals taken for this visit. , BMI There is no height or weight on file to calculate BMI. GEN: Well nourished, well developed, in no acute distress HEENT: normal Neck: no JVD, carotid bruits, or masses Cardiac: ***RRR; no murmurs, rubs, or gallops,no edema  Respiratory:  clear to auscultation bilaterally, normal work of breathing GI: soft, nontender, nondistended, + BS MS: no deformity or atrophy Skin: warm and dry, no rash Neuro:  Strength and sensation are intact Psych: euthymic mood, full affect   EKG:  EKG {ACTION; IS/IS [id number] ordered today. The ekg ordered today demonstrates ***   Recent Labs: 02/14/2016: ALT 18 05/10/2016: Brain Natriuretic Peptide 1,122.3; BUN 22; Creat 1.08; Potassium 3.8; Sodium 144; TSH 4.23 05/20/2016: Hemoglobin 10.3; Platelets 87    Lipid Panel    Component Value Date/Time   CHOL 179 06/19/2013 1241   TRIG 66 06/19/2013 1241   HDL 51 06/19/2013 1241    CHOLHDL 3.5 06/19/2013 1241   VLDL 13 06/19/2013 1241   LDLCALC 115 (H) 06/19/2013 1241      Wt Readings from Last 3 Encounters:  06/16/16 160 lb (72.6 kg)  05/20/16 155 lb (70.3 kg)  05/12/16 156 lb (70.8 kg)      Other studies Reviewed: Additional studies/ records that were reviewed today include: ***. Review of the above records demonstrates: ***   ASSESSMENT AND PLAN:  1.  ***   Current medicines are reviewed at length with the patient today.    Labs/ tests ordered today include: *** No orders of the defined types were placed in this encounter.    Disposition:   FU with *** in {gen number [id number] {TIME; UNITS DAY/WEEK/MONTH:19136}   Signed, Jory Sims, NP  06/18/2016 7:29 AM    [facility] [address] [place], [place] [address] Phone: [phone]; Fax: [phone]

## 2016-06-28 ENCOUNTER — Encounter (HOSPITAL_COMMUNITY): Payer: Self-pay | Admitting: Emergency Medicine

## 2016-06-28 ENCOUNTER — Observation Stay (HOSPITAL_COMMUNITY)
Admission: EM | Admit: 2016-06-28 | Discharge: 2016-06-29 | Disposition: A | Discharge status: Home / Self Care | Payer: Medicare Other | Attending: Family Medicine | Admitting: Family Medicine

## 2016-06-28 ENCOUNTER — Emergency Department (HOSPITAL_COMMUNITY): Payer: Medicare Other

## 2016-06-28 ENCOUNTER — Telehealth: Payer: Self-pay | Admitting: Family Medicine

## 2016-06-28 DIAGNOSIS — I5023 Acute on chronic systolic (congestive) heart failure: Secondary | ICD-10-CM

## 2016-06-28 DIAGNOSIS — I5020 Unspecified systolic (congestive) heart failure: Secondary | ICD-10-CM | POA: Diagnosis not present

## 2016-06-28 DIAGNOSIS — D61818 Other pancytopenia: Secondary | ICD-10-CM | POA: Diagnosis present

## 2016-06-28 DIAGNOSIS — Z87891 Personal history of nicotine dependence: Secondary | ICD-10-CM | POA: Insufficient documentation

## 2016-06-28 DIAGNOSIS — Z79899 Other long term (current) drug therapy: Secondary | ICD-10-CM | POA: Insufficient documentation

## 2016-06-28 DIAGNOSIS — G309 Alzheimer's disease, unspecified: Secondary | ICD-10-CM | POA: Diagnosis not present

## 2016-06-28 DIAGNOSIS — I5022 Chronic systolic (congestive) heart failure: Secondary | ICD-10-CM | POA: Diagnosis not present

## 2016-06-28 DIAGNOSIS — H905 Unspecified sensorineural hearing loss: Secondary | ICD-10-CM | POA: Diagnosis not present

## 2016-06-28 DIAGNOSIS — H409 Unspecified glaucoma: Secondary | ICD-10-CM | POA: Diagnosis present

## 2016-06-28 DIAGNOSIS — Z7982 Long term (current) use of aspirin: Secondary | ICD-10-CM | POA: Insufficient documentation

## 2016-06-28 DIAGNOSIS — F0280 Dementia in other diseases classified elsewhere without behavioral disturbance: Secondary | ICD-10-CM | POA: Diagnosis present

## 2016-06-28 DIAGNOSIS — R0602 Shortness of breath: Secondary | ICD-10-CM | POA: Diagnosis not present

## 2016-06-28 DIAGNOSIS — I080 Rheumatic disorders of both mitral and aortic valves: Secondary | ICD-10-CM | POA: Diagnosis present

## 2016-06-28 DIAGNOSIS — M7989 Other specified soft tissue disorders: Secondary | ICD-10-CM | POA: Insufficient documentation

## 2016-06-28 DIAGNOSIS — Z791 Long term (current) use of non-steroidal anti-inflammatories (NSAID): Secondary | ICD-10-CM | POA: Diagnosis not present

## 2016-06-28 DIAGNOSIS — I110 Hypertensive heart disease with heart failure: Principal | ICD-10-CM | POA: Insufficient documentation

## 2016-06-28 DIAGNOSIS — R296 Repeated falls: Secondary | ICD-10-CM

## 2016-06-28 DIAGNOSIS — M6281 Muscle weakness (generalized): Secondary | ICD-10-CM

## 2016-06-28 DIAGNOSIS — I509 Heart failure, unspecified: Secondary | ICD-10-CM

## 2016-06-28 LAB — TROPONIN I
Troponin I: 0.49 ng/mL (ref <0.03)
Troponin I: 0.45 ng/mL (ref <0.03)

## 2016-06-28 LAB — BASIC METABOLIC PANEL
Anion gap: 0 — ABNORMAL LOW (ref 5–15)
BUN: 26 mg/dL — AB (ref 6–20)
CHLORIDE: 111 mmol/L (ref 101–111)
CO2: 27 mmol/L (ref 22–32)
Calcium: 8.3 mg/dL — ABNORMAL LOW (ref 8.9–10.3)
Creatinine, Ser: 1.07 mg/dL (ref 0.61–1.24)
GFR calc Af Amer: >60 mL/min (ref >60)
GFR calc non Af Amer: >60 mL/min (ref >60)
GLUCOSE: 113 mg/dL — AB (ref 65–99)
POTASSIUM: 3.5 mmol/L (ref 3.5–5.1)
Sodium: 137 mmol/L (ref 135–145)

## 2016-06-28 LAB — URINALYSIS, ROUTINE W REFLEX MICROSCOPIC
GLUCOSE, UA: NEGATIVE mg/dL
Hgb urine dipstick: NEGATIVE
KETONES UR: NEGATIVE mg/dL
NITRITE: NEGATIVE
PH: 6.5 (ref 5.0–8.0)
PROTEIN: NEGATIVE mg/dL
Specific Gravity, Urine: 1.01 (ref 1.005–1.030)

## 2016-06-28 LAB — CBC
HCT: 30.8 % — ABNORMAL LOW (ref 39.0–52.0)
HEMOGLOBIN: 9.9 g/dL — AB (ref 13.0–17.0)
MCH: 32.2 pg (ref 26.0–34.0)
MCHC: 32.1 g/dL (ref 30.0–36.0)
MCV: 100.3 fL — AB (ref 78.0–100.0)
Platelets: 85 10*3/uL — ABNORMAL LOW (ref 150–400)
RBC: 3.07 MIL/uL — AB (ref 4.22–5.81)
RDW: 16.1 % — ABNORMAL HIGH (ref 11.5–15.5)
WBC: 2.4 10*3/uL — AB (ref 4.0–10.5)

## 2016-06-28 LAB — URINE MICROSCOPIC-ADD ON: RBC / HPF: NONE SEEN RBC/hpf (ref 0–5)

## 2016-06-28 LAB — BRAIN NATRIURETIC PEPTIDE: B NATRIURETIC PEPTIDE 5: 723 pg/mL — AB (ref 0.0–100.0)

## 2016-06-28 MED ORDER — ACETAMINOPHEN 325 MG PO TABS
650.0000 mg | ORAL_TABLET | Freq: Three times a day (TID) | ORAL | Status: DC
  Administered 2016-06-28 – 2016-06-29 (×2): 650 mg via ORAL
  Filled 2016-06-28 (×2): qty 2

## 2016-06-28 MED ORDER — ONDANSETRON HCL 4 MG/2ML IJ SOLN: 4.0000 mg | Freq: Four times a day (QID) | INTRAMUSCULAR | Status: DC | PRN

## 2016-06-28 MED ORDER — DORZOLAMIDE HCL 2 % OP SOLN
1.0000 [drp] | Freq: Every day | OPHTHALMIC | Status: DC
  Administered 2016-06-28: 1 [drp] via OPHTHALMIC
  Filled 2016-06-28: qty 10

## 2016-06-28 MED ORDER — LATANOPROST 0.005 % OP SOLN
OPHTHALMIC | Status: AC
Start: 2016-06-28 — End: 2016-06-28
  Filled 2016-06-28: qty 2.5

## 2016-06-28 MED ORDER — ACETAMINOPHEN 325 MG PO TABS: 650.0000 mg | ORAL_TABLET | ORAL | Status: DC | PRN

## 2016-06-28 MED ORDER — FUROSEMIDE 10 MG/ML IJ SOLN
40.0000 mg | Freq: Once | INTRAMUSCULAR | Status: AC
  Administered 2016-06-28: 40 mg via INTRAVENOUS
  Filled 2016-06-28: qty 4

## 2016-06-28 MED ORDER — ASPIRIN EC 81 MG PO TBEC
81.0000 mg | DELAYED_RELEASE_TABLET | Freq: Every day | ORAL | Status: DC
  Administered 2016-06-28 – 2016-06-29 (×2): 81 mg via ORAL
  Filled 2016-06-28 (×2): qty 1

## 2016-06-28 MED ORDER — ENOXAPARIN SODIUM 40 MG/0.4ML ~~LOC~~ SOLN
40.0000 mg | SUBCUTANEOUS | Status: DC
  Administered 2016-06-28: 40 mg via SUBCUTANEOUS
  Filled 2016-06-28: qty 0.4

## 2016-06-28 MED ORDER — SODIUM CHLORIDE 0.9 % IV SOLN: 250.0000 mL | INTRAVENOUS | Status: DC | PRN

## 2016-06-28 MED ORDER — POLYVINYL ALCOHOL 1.4 % OP SOLN: 1.0000 [drp] | Freq: Every day | OPHTHALMIC | Status: DC | PRN

## 2016-06-28 MED ORDER — SODIUM CHLORIDE 0.9% FLUSH: 3.0000 mL | INTRAVENOUS | Status: DC | PRN

## 2016-06-28 MED ORDER — ACETAMINOPHEN ER 650 MG PO TBCR: 650.0000 mg | EXTENDED_RELEASE_TABLET | Freq: Three times a day (TID) | ORAL | Status: DC

## 2016-06-28 MED ORDER — POLYETHYLENE GLYCOL 3350 17 GM/SCOOP PO POWD: 17.0000 g | Freq: Every day | ORAL | Status: DC | PRN

## 2016-06-28 MED ORDER — DICLOFENAC SODIUM 1 % TD GEL
2.0000 g | Freq: Every day | TRANSDERMAL | Status: DC | PRN
  Filled 2016-06-28: qty 100

## 2016-06-28 MED ORDER — POLYETHYLENE GLYCOL 3350 17 G PO PACK: 17.0000 g | PACK | Freq: Every day | ORAL | Status: DC | PRN

## 2016-06-28 MED ORDER — LATANOPROST 0.005 % OP SOLN
1.0000 [drp] | Freq: Every day | OPHTHALMIC | Status: DC
  Administered 2016-06-28: 1 [drp] via OPHTHALMIC
  Filled 2016-06-28: qty 2.5

## 2016-06-28 MED ORDER — BUSPIRONE HCL 5 MG PO TABS: 5.0000 mg | ORAL_TABLET | Freq: Every day | ORAL | Status: DC | PRN

## 2016-06-28 MED ORDER — CARBOXYMETHYLCELLUL-GLYCERIN 0.5-0.9 % OP SOLN: 1.0000 [drp] | Freq: Every day | OPHTHALMIC | Status: DC

## 2016-06-28 MED ORDER — SODIUM CHLORIDE 0.9% FLUSH
3.0000 mL | Freq: Two times a day (BID) | INTRAVENOUS | Status: DC
  Administered 2016-06-28 – 2016-06-29 (×2): 3 mL via INTRAVENOUS

## 2016-06-28 NOTE — ED Triage Notes (Signed)
PT c/o bilateral lower legs swelling x2 days. PT states and also states SOB on exertion with blurry vision at times x2 days.

## 2016-06-28 NOTE — ED Provider Notes (Signed)
[facility] DEPT Provider Note   CSN: [id number] Arrival date & time: 06/28/16  1234     History   Chief Complaint Chief Complaint  Patient presents with  . Shortness of Breath    HPI [name] is a 82 y.o. male.  Patient complains of worsening swelling in his legs and generalized weakness no pain.   The history is provided by the patient. No language interpreter was used.  Weakness  Primary symptoms include no focal weakness. This is a recurrent problem. The current episode started more than 1 week ago. The problem has not changed since onset.There was no focality noted. There has been no fever. Associated symptoms include shortness of breath. Pertinent negatives include no chest pain and no headaches. Associated symptoms comments: Swelling in legs      . Meds prior to arrival: Lasix. Associated medical issues do not include trauma.    Past Medical History:  Diagnosis Date  . A-fib ([place])   . Alzheimer disease   . Arthritis   . Asymptomatic carotid artery stenosis    BILATERAL MILD ICA---  <50% PER DUPLEX 03-08-2008  . BPH (benign prostatic hypertrophy)   . Cancer ([place])   . Cardiac pacemaker    CARDIOLOGIST-- DR Cristopher Peru (LAST PACE [facility] 05-15-2013)  . CHB (complete heart block) ([place])   . Chronic back pain   . Chronic pancreatitis ([place])    Based on CT findings  . Chronic systolic heart failure ([place])   . Costochondritis, acute   . Dementia   . Erectile dysfunction   . Glaucoma   . History of melanoma in situ    JAN 2012--  LEFT HEEL W/ SLN BX  . History of syncope   . HOH (hard of hearing)   . Hypertension   . IC (interstitial cystitis)   . Nonischemic cardiomyopathy ([place])   . Pancytopenia   . Poor historian   . Rash and other nonspecific skin eruption   . Severe mitral regurgitation   . Urge urinary incontinence   . Visual changes     Patient Active Problem List   Diagnosis Date Noted  . CHF (congestive heart failure) ([place]) 06/28/2016  .  Generalized anxiety disorder 04/05/2016  . Pigmented skin lesion 04/05/2016  . Acute CHF (congestive heart failure) ([place]) 03/15/2016  . Mitral regurgitation 03/15/2016  . Tricuspid regurgitation 03/15/2016  . Pulmonary hypertension ([place]) 10/01/2015  . Fatigue 10/01/2015  . Chronic back pain 10/01/2015  . Myelodysplasia 06/20/2015  . Dyspnea 02/09/2015  . Recurrent falls 08/01/2014  . Atrial fibrillation, persistent ([place]) 02/22/2014  . Bilateral leg weakness 05/01/2013  . Abnormal gait 06/12/2012  . GERD (gastroesophageal reflux disease) 05/09/2012  . Alzheimer's dementia 11/17/2011  . Backache 02/18/2010  . GEN OSTEOARTHROSIS INVOLVING MULTIPLE SITES 11/25/2009  . CENTRAL HEARING LOSS 11/10/2009  . Biventricular cardiac pacemaker in situ 07/22/2009  . INTERSTITIAL CYSTITIS 07/14/2009  . Pancytopenia ([place]) 12/31/2008  . MITRAL REGURGITATION, MODERATE 12/31/2008  . Chronic systolic heart failure ([place]) 12/31/2008  . ERECTILE DYSFUNCTION 01/31/2008  . Glaucoma 01/31/2008  . Essential hypertension 01/31/2008    Past Surgical History:  Procedure Laterality Date  . BACK SURGERY  X3  LAST ONE'90's  . CATARACT EXTRACTION, BILATERAL  left  1997/   right 2003  . COLONOSCOPY  08/18/2006   [id number] granularity and erosions of the rectum of uncertain significance biopsied.  A long redundant, but otherwise normal-appearing colon.melanosi coli and minimal inflammation on bx  . COLONOSCOPY N/A 08/05/2014   Procedure:  COLONOSCOPY;  Surgeon: [name], MD;  [facility];  Service: Endoscopy;  Laterality: N/A;  PT NEEDS TCS AT 1000.  Marland Kitchen CYSTO/ HOD/  REPLACEMENT INTERSTIM IMPLANT  05-14-2003  . CYSTOSCOPY W/ RETROGRADES N/A 02/11/2016   Procedure: CYSTOSCOPY WITH RETROGRADE PYELOGRAM;  Surgeon: Cleon Gustin, MD;  Location: [facility];  Service: Urology;  Laterality: N/A;  . ESOPHAGOGASTRODUODENOSCOPY N/A 03/28/2014   Dr. [name]: normal esophagus s/p dilation, mosaic appearance -  chronic inflammation noted on bx  . IMPLANTABLE CARDIOVERTER DEFIBRILLATOR (ICD) GENERATOR CHANGE N/A 10/11/2014   Procedure: ICD GENERATOR CHANGE;  Surgeon: [name], MD;  Location[facility] CATH LAB;  Service: Cardiovascular;  Laterality: N/A;  . INGUINAL HERNIA REPAIR Bilateral AS TEEN  . INTERSTIM IMPLANT PLACEMENT  2001  &  2007  . INTERSTIM IMPLANT REVISION N/A 08/28/2013   Procedure: REPLACEMENT OF INTERSTIM-GENERATOR AND LEAD ;  Surgeon: [name], MD;  Location: [facility];  Service: Urology;  Laterality: N/A;  . LEFT ELBOW SURGERY  2002  . MALONEY DILATION N/A 03/28/2014   Procedure: Venia Minks DILATION;  Surgeon: [name], MD;  [facility];  Service: Endoscopy;  Laterality: N/A;  . PACEMAKER INSERTION  12-06-2008  DR [name]   BiV PPM  --  [employer]  . REMOVAL INTERSTIM IMPLANT/  CYST/  HOD  04-14-2004  . REVISION INTERSTIM IMPLANT AND REPLACE GENERATOR  05-15-2009   left upper buttock for urge urinary incontinence  . SAVORY DILATION N/A 03/28/2014   Procedure: SAVORY DILATION;  Surgeon: [name], MD;  [facility];  Service: Endoscopy;  Laterality: N/A;  . TONSILLECTOMY  1950  . TRANSTHORACIC ECHOCARDIOGRAM  12-06-2008   LVSF 55-60%/  MODERATE MR/  MILD AR/  QUESTION DISTAL POSTERIOR HYPOKINESIS  . TRANSURETHRAL RESECTION OF BLADDER TUMOR WITH GYRUS (TURBT-GYRUS) N/A 02/11/2016   Procedure: TRANSURETHRAL RESECTION OF BLADDER TUMOR WITH GYRUS (TURBT-GYRUS);  Surgeon: Cleon Gustin, MD;  Location: [facility];  Service: Urology;  Laterality: N/A;  . WIDE EXCISION LEFT HEEL AND SLN BX  JUNE 2012       Home Medications    Prior to Admission medications   Medication Sig Start Date End Date Taking? Authorizing Provider  amLODipine (NORVASC) 2.5 MG tablet Take 2.5 mg by mouth daily. 06/07/16  Yes Historical Provider, MD  aspirin EC 81 MG EC tablet Take 1 tablet (81 mg total) by mouth daily. 03/16/16  Yes Samuella Cota, MD   dorzolamide (TRUSOPT) 2 % ophthalmic solution Place 1 drop into both eyes daily. One drop both eyes    Yes Historical Provider, MD  ENSURE (ENSURE) Take 237 mLs by mouth daily. Pt takes daily    Yes Historical Provider, MD  fentaNYL (DURAGESIC - DOSED MCG/HR) 25 MCG/HR patch Place 1 patch (25 mcg total) onto the skin every 3 (three) days. 06/09/16  Yes Fayrene Helper, MD  furosemide (LASIX) 20 MG tablet Take 1 tablet (20 mg total) by mouth daily. May take an additional 20 mg in the afternoon AS NEEDED 05/20/16 08/18/16 Yes Lendon Colonel, NP  latanoprost (XALATAN) 0.005 % ophthalmic solution Place 1 drop into both eyes at bedtime. Both eyes    Yes Historical Provider, MD  Meth-Hyo-M Bl-Na Phos-Ph Sal (URIBEL) 118 MG CAPS Take 1 capsule (118 mg total) by mouth 2 (two) times daily as needed (bladder). 02/11/16  Yes Cleon Gustin, MD  Multiple Vitamins-Minerals (CENTRUM SILVER 50+MEN) TABS Take 1 tablet by  mouth daily. Daily    Yes Historical Provider, MD  acetaminophen (TYLENOL) 650 MG CR tablet Take 650 mg by mouth every 8 (eight) hours as needed for pain.     Historical Provider, MD  busPIRone (BUSPAR) 5 MG tablet TAKE 1 TABLET BY MOUTH EVERY DAY AS NEEDED FOR ANXIETY 06/14/16   Fayrene Helper, MD  Carboxymethylcellul-Glycerin 0.5-0.9 % SOLN Apply 1 drop to eye daily. One drop both eyes     Historical Provider, MD  diclofenac sodium (VOLTAREN) 1 % GEL Apply 2 g topically daily as needed (pain). Reported on 01/05/2016 12/04/15   Historical Provider, MD  ibuprofen (ADVIL,MOTRIN) 200 MG tablet Take 400 mg by mouth every 6 (six) hours as needed for moderate pain.    Historical Provider, MD  polyethylene glycol powder (GLYCOLAX/MIRALAX) powder Take 17 g by mouth daily. Patient taking differently: Take 17 g by mouth daily as needed for mild constipation.  05/16/15   Fayrene Helper, MD    Family History Family History  Problem Relation Age of Onset  . Dementia Mother   . Throat cancer  Father   . Cancer Father 86    throat  . Lung cancer Sister   . Heart disease Brother     Social History Social History  Substance Use Topics  . Smoking status: Former Smoker    Packs/day: 0.02    Years: 30.00    Types: Cigarettes    Quit date: 08/24/1997  . Smokeless tobacco: Never Used  . Alcohol use No     Allergies   Codeine and Penicillins   Review of Systems Review of Systems  Constitutional: Negative for appetite change and fatigue.       Weakness  HENT: Negative for congestion, ear discharge and sinus pressure.   Eyes: Negative for discharge.  Respiratory: Positive for shortness of breath. Negative for cough.   Cardiovascular: Negative for chest pain.  Gastrointestinal: Negative for abdominal pain and diarrhea.  Genitourinary: Negative for frequency and hematuria.  Musculoskeletal: Negative for back pain.       Edema in legs  Skin: Negative for rash.  Neurological: Positive for weakness. Negative for focal weakness, seizures and headaches.  Psychiatric/Behavioral: Negative for hallucinations.  All other systems reviewed and are negative.    Physical Exam Updated Vital Signs BP 114/85   Pulse 60   Temp 97.7 F (36.5 C) (Oral)   Resp 18   Ht 5\' 10"  (1.778 m)   Wt 160 lb (72.6 kg)   SpO2 92%   BMI 22.96 kg/m   Physical Exam  Constitutional: He is oriented to person, place, and time. He appears well-developed.  HENT:  Head: Normocephalic.  Eyes: Conjunctivae and EOM are normal. No scleral icterus.  Neck: Neck supple. No thyromegaly present.  Cardiovascular: Normal rate and regular rhythm.  Exam reveals no gallop and no friction rub.   No murmur heard. Pulmonary/Chest: No stridor. He has no wheezes. He has no rales. He exhibits no tenderness.  Abdominal: He exhibits no distension. There is no tenderness. There is no rebound.  Musculoskeletal: Normal range of motion. He exhibits edema.  Lymphadenopathy:    He has no cervical adenopathy.    Neurological: He is oriented to person, place, and time. He exhibits normal muscle tone. Coordination normal.  Skin: No rash noted. No erythema.  Psychiatric: He has a normal mood and affect. His behavior is normal.     ED Treatments / Results  Labs (all labs ordered are listed, but  only abnormal results are displayed) Labs Reviewed  BASIC METABOLIC PANEL - Abnormal; Notable for the following:       Result Value   Glucose, Bld 113 (*)    BUN 26 (*)    Calcium 8.3 (*)    Anion gap 0.0 (*)    All other components within normal limits  CBC - Abnormal; Notable for the following:    WBC 2.4 (*)    RBC 3.07 (*)    Hemoglobin 9.9 (*)    HCT 30.8 (*)    MCV 100.3 (*)    RDW 16.1 (*)    Platelets 85 (*)    All other components within normal limits  TROPONIN I - Abnormal; Notable for the following:    Troponin I 0.49 (*)    All other components within normal limits  BRAIN NATRIURETIC PEPTIDE - Abnormal; Notable for the following:    B Natriuretic Peptide 723.0 (*)    All other components within normal limits    EKG  EKG Interpretation  Date/Time:  Monday June 28 2016 12:55:02 EDT Ventricular Rate:  72 PR Interval:    QRS Duration: 138 QT Interval:  452 QTC Calculation: 494 R Axis:   -54 Text Interpretation:  Ventricular-paced rhythm Abnormal ECG Confirmed by Burnie Hank  MD, Eviana Sibilia [phone]) on 06/28/2016 3:49:19 PM       Radiology Dg Chest 2 View  Result Date: 06/28/2016 CLINICAL DATA:  Lower extremity edema, shortness of breath for 2 days EXAM: CHEST  2 VIEW COMPARISON:  CT chest 03/15/2016 FINDINGS: There is mild bilateral interstitial prominence. There is no focal parenchymal opacity. There is no pleural effusion or pneumothorax. There is stable cardiomegaly. There is prominence of the central pulmonary vasculature. There is a 3 lead cardiac pacemaker. There is thoracic aortic atherosclerosis. The osseous structures are unremarkable. IMPRESSION: No active cardiopulmonary  disease. Electronically Signed   By: Kathreen Devoid   On: 06/28/2016 13:19    Procedures Procedures (including critical care time)  Medications Ordered in ED Medications  furosemide (LASIX) injection 40 mg (not administered)     Initial Impression / Assessment and Plan / ED Course  I have reviewed the triage vital signs and the nursing notes.  Pertinent labs & imaging results that were available during my care of the patient were reviewed by me and considered in my medical decision making (see chart for details).  Clinical Course    Patient with swelling in legs consistent with congestive heart failure. Patient also with chronically elevated troponin and elevated BNP. He will be admitted to the hospital for diuresis. It is unclear if the patient has taken his Lasix appropriately  Final Clinical Impressions(s) / ED Diagnoses   Final diagnoses:  Systolic congestive heart failure, unspecified congestive heart failure chronicity ([place])    New Prescriptions New Prescriptions   No medications on file     Milton Ferguson, MD 06/28/16 1715

## 2016-06-28 NOTE — Telephone Encounter (Signed)
[name] is not putting out any urine, c/o back pain feet and legs swollen, Guy Franco states she is giving hime the fluid pills, please advise?

## 2016-06-28 NOTE — Telephone Encounter (Signed)
Spoke with wife.  Advised that she call [facility] and see if patient can be seen earlier.  PCP not available.

## 2016-06-28 NOTE — ED Notes (Signed)
CRITICAL VALUE ALERT  Critical value received:  Trop 0.49  Date of notification:  06/28/16  Time of notification:  [id number]  Critical value read back:Yes.    Nurse who received alert:  RMinter, RN  MD notified (1st page):  Dr. Roderic Palau  Time of first page:  1420  MD notified (2nd page):  Time of second page:  Responding MD:  Dr. Roderic Palau  Time MD responded:  1420

## 2016-06-28 NOTE — H&P (Signed)
History and Physical    [name] [id number] DOB: 18-Nov-1925 DOA: 06/28/2016  PCP: [name], MD  Patient coming from: Home  Chief Complaint: Leg swelling and shortness of breath  HPI: [name] is a 82 y.o. male with medical history significant of chronic systolic heart failure with LVEF with EF of 55-60% per echo in 2016, atrial fibrillation but not on anticoagulation due to history of GI bleed, intolerant to beta blockers due to side effects. History of mitral regurgitation moderate to severe by echo in 10 2016, tricuspid regurg moderate to severe by echo with PASD of 69, mild RV dysfunction,and history of biventricular pacemaker placed by Dr. Lovena Le and managed by his clinic who presented to the emergency department for leg swelling and shortness of breath.  Patient also has a history of dementia and per last cardiology note has become more and more debilitated over the past few months.  Plan after that visit was to follow up with Dr. Moshe Cipro to discuss goals of care, prognosis and possible placement.  Patient is a poor historian however, EDP was able to elicit some history of his wife but unfortunately she is chronically ill as well.  Patient reports that he has been trying to avoid taking his lasix at home because he dislikes having to urinate.  He mentions occasionally he can trick his wife and not take it but often he does take it.  He voices his last dose could have been this morning but he isn't sure.  He has been short of breath, has had back pain and swelling of the legs and feet.  Back pain has been present for about 1 week after a fall- patient reports numerous falls.  Legs and feet swelling has been going on for 2-3 weeks and states that taking lasix occasionally doesn't help.  He has also had chest pain occasionally on and off for a week which patient did not relate to exertion or not.  No nausea, no vomiting, no abdominal pain, occasional urinary retention.    ED Course:  Patient seen and evaluated by EDP.  BNP was elevated to 723 and troponin was elevate at 0.49.  Troponin appears to be chronically elevated around that level.  EKG shows a ventricular paced rhythmn.  CBC showed pancytopenia which appears to be chronic.  Review of Systems: As per HPI otherwise 10 point review of systems negative.    Past Medical History:  Diagnosis Date  . A-fib ([place])   . Alzheimer disease   . Arthritis   . Asymptomatic carotid artery stenosis    BILATERAL MILD ICA---  <50% PER DUPLEX 03-08-2008  . BPH (benign prostatic hypertrophy)   . Cancer ([place])   . Cardiac pacemaker    CARDIOLOGIST-- DR Cristopher Peru (LAST PACE [facility] 05-15-2013)  . CHB (complete heart block) ([place])   . Chronic back pain   . Chronic pancreatitis ([place])    Based on CT findings  . Chronic systolic heart failure ([place])   . Costochondritis, acute   . Dementia   . Erectile dysfunction   . Glaucoma   . History of melanoma in situ    JAN 2012--  LEFT HEEL W/ SLN BX  . History of syncope   . HOH (hard of hearing)   . Hypertension   . IC (interstitial cystitis)   . Nonischemic cardiomyopathy ([place])   . Pancytopenia   . Poor historian   . Rash and other nonspecific skin eruption   . Severe mitral regurgitation   .  Urge urinary incontinence   . Visual changes     Past Surgical History:  Procedure Laterality Date  . BACK SURGERY  X3  LAST ONE'90's  . CATARACT EXTRACTION, BILATERAL  left  1997/   right 2003  . COLONOSCOPY  08/18/2006   [id number] granularity and erosions of the rectum of uncertain significance biopsied.  A long redundant, but otherwise normal-appearing colon.melanosi coli and minimal inflammation on bx  . COLONOSCOPY N/A 08/05/2014   Procedure: COLONOSCOPY;  Surgeon: [name], MD;  Location[facility];  Service: Endoscopy;  Laterality: N/A;  PT NEEDS TCS AT 1000.  Marland Kitchen CYSTO/ HOD/  REPLACEMENT INTERSTIM IMPLANT  05-14-2003  . CYSTOSCOPY W/ RETROGRADES N/A 02/11/2016   Procedure:  CYSTOSCOPY WITH RETROGRADE PYELOGRAM;  Surgeon: Cleon Gustin, MD;  Location: [facility] OR[facility];  Service: Urology;  Laterality: N/A;  . ESOPHAGOGASTRODUODENOSCOPY N/A 03/28/2014   Dr. [name]: normal esophagus s/p dilation, mosaic appearance - chronic inflammation noted on bx  . IMPLANTABLE CARDIOVERTER DEFIBRILLATOR (ICD) GENERATOR CHANGE N/A 10/11/2014   Procedure: ICD GENERATOR CHANGE;  Surgeon: [name], MD;  Location: [facility] CATH LAB;  Service: Cardiovascular;  Laterality: N/A;  . INGUINAL HERNIA REPAIR Bilateral AS TEEN  . INTERSTIM IMPLANT PLACEMENT  2001  &  2007  . INTERSTIM IMPLANT REVISION N/A 08/28/2013   Procedure: REPLACEMENT OF INTERSTIM-GENERATOR AND LEAD ;  Surgeon: [name], MD;  Location: [place];  Service: Urology;  Laterality: N/A;  . LEFT ELBOW SURGERY  2002  . MALONEY DILATION N/A 03/28/2014   Procedure: Venia Minks DILATION;  Surgeon: [name], MD;  [facility];  Service: Endoscopy;  Laterality: N/A;  . PACEMAKER INSERTION  12-06-2008  DR [name]   BiV PPM  --  [employer]  . REMOVAL INTERSTIM IMPLANT/  CYST/  HOD  04-14-2004  . REVISION INTERSTIM IMPLANT AND REPLACE GENERATOR  05-15-2009   left upper buttock for urge urinary incontinence  . SAVORY DILATION N/A 03/28/2014   Procedure: SAVORY DILATION;  Surgeon: [name], MD;  [facility];  Service: Endoscopy;  Laterality: N/A;  . TONSILLECTOMY  1950  . TRANSTHORACIC ECHOCARDIOGRAM  12-06-2008   LVSF 55-60%/  MODERATE MR/  MILD AR/  QUESTION DISTAL POSTERIOR HYPOKINESIS  . TRANSURETHRAL RESECTION OF BLADDER TUMOR WITH GYRUS (TURBT-GYRUS) N/A 02/11/2016   Procedure: TRANSURETHRAL RESECTION OF BLADDER TUMOR WITH GYRUS (TURBT-GYRUS);  Surgeon: Cleon Gustin, MD;  Location: [facility];  Service: Urology;  Laterality: N/A;  . WIDE EXCISION LEFT HEEL AND SLN BX  JUNE 2012     reports that he quit smoking about 18 years ago. His smoking use included Cigarettes. He has a  0.60 pack-year smoking history. He has never used smokeless tobacco. He reports that he does not drink alcohol or use drugs.     Family History  Problem Relation Age of Onset  . Dementia Mother   . Throat cancer Father   . Cancer Father 86    throat  . Lung cancer Sister   . Heart disease Brother      Prior to Admission medications   Medication Sig Start Date End Date Taking? Authorizing Provider  amLODipine (NORVASC) 2.5 MG tablet Take 2.5 mg by mouth daily. 06/07/16  Yes Historical Provider, MD  aspirin EC 81 MG EC tablet Take 1 tablet (81 mg total) by mouth daily. 03/16/16  Yes Samuella Cota, MD  dorzolamide (TRUSOPT) 2 % ophthalmic solution Place 1 drop into both  eyes daily. One drop both eyes    Yes Historical Provider, MD  ENSURE (ENSURE) Take 237 mLs by mouth daily. Pt takes daily    Yes Historical Provider, MD  fentaNYL (DURAGESIC - DOSED MCG/HR) 25 MCG/HR patch Place 1 patch (25 mcg total) onto the skin every 3 (three) days. 06/09/16  Yes Fayrene Helper, MD  furosemide (LASIX) 20 MG tablet Take 1 tablet (20 mg total) by mouth daily. May take an additional 20 mg in the afternoon AS NEEDED 05/20/16 08/18/16 Yes Lendon Colonel, NP  latanoprost (XALATAN) 0.005 % ophthalmic solution Place 1 drop into both eyes at bedtime. Both eyes    Yes Historical Provider, MD  Meth-Hyo-M Bl-Na Phos-Ph Sal (URIBEL) 118 MG CAPS Take 1 capsule (118 mg total) by mouth 2 (two) times daily as needed (bladder). 02/11/16  Yes Cleon Gustin, MD  Multiple Vitamins-Minerals (CENTRUM SILVER 50+MEN) TABS Take 1 tablet by mouth daily. Daily    Yes Historical Provider, MD  acetaminophen (TYLENOL) 650 MG CR tablet Take 650 mg by mouth every 8 (eight) hours as needed for pain.     Historical Provider, MD  busPIRone (BUSPAR) 5 MG tablet TAKE 1 TABLET BY MOUTH EVERY DAY AS NEEDED FOR ANXIETY 06/14/16   Fayrene Helper, MD  Carboxymethylcellul-Glycerin 0.5-0.9 % SOLN Apply 1 drop to eye daily. One drop  both eyes     Historical Provider, MD  diclofenac sodium (VOLTAREN) 1 % GEL Apply 2 g topically daily as needed (pain). Reported on 01/05/2016 12/04/15   Historical Provider, MD  ibuprofen (ADVIL,MOTRIN) 200 MG tablet Take 400 mg by mouth every 6 (six) hours as needed for moderate pain.    Historical Provider, MD  polyethylene glycol powder (GLYCOLAX/MIRALAX) powder Take 17 g by mouth daily. Patient taking differently: Take 17 g by mouth daily as needed for mild constipation.  05/16/15   Fayrene Helper, MD    Physical Exam: Vitals:   06/28/16 1427 06/28/16 1500 06/28/16 1600 06/28/16 1700  BP: 112/80 115/78 114/85 117/90  Pulse: 64 (!) 57 60 (!) 59  Resp: 18 24 18 15   Temp:      TempSrc:      SpO2: 97% 95% 92% 96%  Weight:      Height:          Constitutional: NAD, calm, comfortable Vitals:   06/28/16 1427 06/28/16 1500 06/28/16 1600 06/28/16 1700  BP: 112/80 115/78 114/85 117/90  Pulse: 64 (!) 57 60 (!) 59  Resp: 18 24 18 15   Temp:      TempSrc:      SpO2: 97% 95% 92% 96%  Weight:      Height:       Eyes: drooping eyelid with EOMI conjunctivae normal, vision is poor which patient states is due to his glaucoma ENMT: Mucous membranes are moist. Posterior pharynx clear of any exudate or lesions.Normal dentition- dentures in place Neck: normal, supple, no masses, no thyromegaly Respiratory: clear to auscultation bilaterally, no wheezing, no crackles. Normal respiratory effort. No accessory muscle use.  Cardiovascular: Regular rate and rhythm, no rubs / gallops. II/VI systolic murmur heard best in the tricuspid and mitral area. Lower extremity edema to mid tibia bilaterally. 2+ pedal pulses. No carotid bruits.  Abdomen: no tenderness, no masses palpated. No hepatosplenomegaly. Bowel sounds positive.  Musculoskeletal: no clubbing / cyanosis. Ulnar deviation of the fingers on the left hand. Good ROM, no contractures. Normal muscle tone.  Skin: no rashes, lesions, ulcers. No  induration Neurologic: CN 2-12 grossly intact. Sensation intact, DTR normal. Strength 5/5 in all 4.  Psychiatric: Poor insight,  Alert and oriented x 3 (person, month, place). Normal mood.    Labs on Admission: I have personally reviewed following labs and imaging studies  CBC:  Recent Labs Lab 06/28/16 1327  WBC 2.4*  HGB 9.9*  HCT 30.8*  MCV 100.3*  PLT 85*   Basic Metabolic Panel:  Recent Labs Lab 06/28/16 1327  NA 137  K 3.5  CL 111  CO2 27  GLUCOSE 113*  BUN 26*  CREATININE 1.07  CALCIUM 8.3*   GFR: Estimated Creatinine Clearance: 49.9 mL/min (by C-G formula based on SCr of 1.07 mg/dL). Liver Function Tests: No results for input(s): AST, ALT, ALKPHOS, BILITOT, PROT, ALBUMIN in the last 168 hours. No results for input(s): LIPASE, AMYLASE in the last 168 hours. No results for input(s): AMMONIA in the last 168 hours. Coagulation Profile: No results for input(s): INR, PROTIME in the last 168 hours. Cardiac Enzymes:  Recent Labs Lab 06/28/16 1327  TROPONINI 0.49*   BNP (last 3 results) No results for input(s): PROBNP in the last 8760 hours. HbA1C: No results for input(s): HGBA1C in the last 72 hours. CBG: No results for input(s): GLUCAP in the last 168 hours. Lipid Profile: No results for input(s): CHOL, HDL, LDLCALC, TRIG, CHOLHDL, LDLDIRECT in the last 72 hours. Thyroid Function Tests: No results for input(s): TSH, T4TOTAL, FREET4, T3FREE, THYROIDAB in the last 72 hours. Anemia Panel: No results for input(s): VITAMINB12, FOLATE, FERRITIN, TIBC, IRON, RETICCTPCT in the last 72 hours. Urine analysis:    Component Value Date/Time   COLORURINE GREEN (A) 04/05/2016 1421   APPEARANCEUR TURBID (A) 04/05/2016 1421   LABSPEC 1.016 04/05/2016 1421   PHURINE 6.0 04/05/2016 1421   GLUCOSEU NEGATIVE 04/05/2016 1421   HGBUR 1+ (A) 04/05/2016 1421   BILIRUBINUR NEGATIVE 04/05/2016 1421   BILIRUBINUR neg 12/28/2013 1142   KETONESUR NEGATIVE 04/05/2016 1421    PROTEINUR 1+ (A) 04/05/2016 1421   UROBILINOGEN 1.0 12/28/2013 1142   NITRITE NEGATIVE 04/05/2016 1421   LEUKOCYTESUR 3+ (A) 04/05/2016 1421   Sepsis Labs: !!!!!!!!!!!!!!!!!!!!!!!!!!!!!!!!!!!!!!!!!!!! @LABRCNTIP (procalcitonin:4,lacticidven:4) )No results found for this or any previous visit (from the past 240 hour(s)).   Radiological Exams on Admission: Dg Chest 2 View  Result Date: 06/28/2016 CLINICAL DATA:  Lower extremity edema, shortness of breath for 2 days EXAM: CHEST  2 VIEW COMPARISON:  CT chest 03/15/2016 FINDINGS: There is mild bilateral interstitial prominence. There is no focal parenchymal opacity. There is no pleural effusion or pneumothorax. There is stable cardiomegaly. There is prominence of the central pulmonary vasculature. There is a 3 lead cardiac pacemaker. There is thoracic aortic atherosclerosis. The osseous structures are unremarkable. IMPRESSION: No active cardiopulmonary disease. Electronically Signed   By: Kathreen Devoid   On: 06/28/2016 13:19    EKG: Independently reviewed. Ventricularly paced rhythm  Assessment/Plan Principal Problem:   CHF (congestive heart failure) (HCC) Active Problems:   Pancytopenia (HCC)   Glaucoma   Central hearing loss   MITRAL REGURGITATION, MODERATE   Alzheimer's dementia   Recurrent falls    Acute on Chronic Systolic Heart Failure - given 40mg  IV Lasix in the ED - BMP in am - will start 20mg  PO Lasix in am - I/O (strict) - daily weights - not on other heart failure medication due to inability to tolerate  Elevated Troponin - will cycle troponins - aspirin given - appears chronically elevated - likely in setting of  demand ischemia  Pancytopenia - Chronic - Appears at baseline - no signs of active bleeding  Abnormal urinalysis - awaiting results of UA but urine is turbid and green - appears chronic? - will await UA results  Recurrent Falls - PT/ OT eval ordered - CM consult for HH needs - fall  risk  Alzheimer's dementia - reviewing last note appears that patient is at his baseline   DVT prophylaxis: Lovenox and Ted Hose Code Status: Full Code Family Communication:  No family bedside Disposition Plan: discharge tomorrow if edema improved Consults called: none Admission status: obs   Newman Pies MD [facility] Pager [phone]  If 7PM-7AM, please contact night-coverage [email or web address] Password [email or web address]  06/28/2016, 6:20 PM

## 2016-06-29 DIAGNOSIS — I5023 Acute on chronic systolic (congestive) heart failure: Secondary | ICD-10-CM | POA: Diagnosis not present

## 2016-06-29 DIAGNOSIS — I110 Hypertensive heart disease with heart failure: Secondary | ICD-10-CM | POA: Diagnosis not present

## 2016-06-29 DIAGNOSIS — G309 Alzheimer's disease, unspecified: Secondary | ICD-10-CM | POA: Diagnosis not present

## 2016-06-29 DIAGNOSIS — H905 Unspecified sensorineural hearing loss: Secondary | ICD-10-CM | POA: Diagnosis not present

## 2016-06-29 DIAGNOSIS — H409 Unspecified glaucoma: Secondary | ICD-10-CM | POA: Diagnosis not present

## 2016-06-29 LAB — BASIC METABOLIC PANEL
Anion gap: 0 — ABNORMAL LOW (ref 5–15)
BUN: 25 mg/dL — AB (ref 6–20)
CALCIUM: 8.2 mg/dL — AB (ref 8.9–10.3)
CO2: 29 mmol/L (ref 22–32)
Chloride: 110 mmol/L (ref 101–111)
Creatinine, Ser: 0.94 mg/dL (ref 0.61–1.24)
GFR calc Af Amer: >60 mL/min (ref >60)
GLUCOSE: 106 mg/dL — AB (ref 65–99)
POTASSIUM: 3.4 mmol/L — AB (ref 3.5–5.1)
Sodium: 139 mmol/L (ref 135–145)

## 2016-06-29 LAB — TROPONIN I
TROPONIN I: 0.46 ng/mL — AB (ref <0.03)
TROPONIN I: 0.47 ng/mL — AB (ref <0.03)

## 2016-06-29 MED ORDER — POTASSIUM CHLORIDE CRYS ER 10 MEQ PO TBCR: 10.0000 meq | EXTENDED_RELEASE_TABLET | Freq: Every day | ORAL | Status: DC

## 2016-06-29 MED ORDER — NITROFURANTOIN MONOHYD MACRO 100 MG PO CAPS: 100.0000 mg | ORAL_CAPSULE | Freq: Two times a day (BID) | ORAL | 0 refills | Status: AC

## 2016-06-29 MED ORDER — FUROSEMIDE 10 MG/ML IJ SOLN
20.0000 mg | Freq: Once | INTRAMUSCULAR | Status: AC
  Administered 2016-06-29: 20 mg via INTRAVENOUS
  Filled 2016-06-29: qty 2

## 2016-06-29 MED ORDER — NITROFURANTOIN MONOHYD MACRO 100 MG PO CAPS
100.0000 mg | ORAL_CAPSULE | Freq: Two times a day (BID) | ORAL | Status: DC
  Filled 2016-06-29 (×3): qty 1

## 2016-06-29 MED ORDER — POTASSIUM CHLORIDE CRYS ER 10 MEQ PO TBCR: 10.0000 meq | EXTENDED_RELEASE_TABLET | Freq: Every day | ORAL | 0 refills | Status: DC

## 2016-06-29 NOTE — Evaluation (Addendum)
Physical Therapy Evaluation Patient Details Name: [name] MRN: [id number] DOB: April 27, 1926 Today's Date: 06/29/2016   History of Present Illness  82 y.o. male with medical history significant of chronic systolic heart failure with LVEF with EF of 55-60% per echo in 2016, atrial fibrillation but not on anticoagulation due to history of GI bleed, intolerant to beta blockers due to side effects. History of mitral regurgitation moderate to severe by echo in 10 2016, tricuspid regurg moderate to severe by echo with PASD of 69, mild RV dysfunction,and history of biventricular pacemaker placed by Dr. Lovena Le and managed by his clinic who presented to the emergency department for leg swelling and shortness of breath.  Patient also has a history of dementia and per last cardiology note has become more and more debilitated over the past few months.  Plan after that visit was to follow up with Dr. Moshe Cipro to discuss goals of care, prognosis and possible placement.  Patient is a poor historian however, EDP was able to elicit some history of his wife but unfortunately she is chronically ill as well.  Patient reports that he has been trying to avoid taking his lasix at home because he dislikes having to urinate.  He mentions occasionally he can trick his wife and not take it but often he does take it.  He voices his last dose could have been this morning but he isn't sure.  He has been short of breath, has had back pain and swelling of the legs and feet.  Back pain has been present for about 1 week after a fall- patient reports numerous falls.  Legs and feet swelling has been going on for 2-3 weeks and states that taking lasix occasionally doesn't help.  He has also had chest pain occasionally on and off for a week which patient did not relate to exertion or not.  No nausea, no vomiting, no abdominal pain, occasional urinary retention.   Troponin is chronically elevated and today is 0.46 which is down from admission at  0.49.      Clinical Impression  Pt received in bed, and was agreeable to PT evaluation.  Pt states that he lives with his wife, and normally uses a cane for mobility.  He is normally independent with dressing and bathing, but no longer drives.  Pt has had multiple falls.  He also states that he wears supplemental O2 on occasion.  During PT evaluation he was very tangental with answers to questions.  He required Min A for sit<>stand.  He ambulated 32ft with cane, however, he was extremely unsteady, and required up to Mod A.  He required 1 standing rest break due to fatigue, unable to obtain SpO2 reading on pulse ox, however pt seemed SOB.  At this point, due to pt's history of falls, poor dynamic balance demonstrated during gait training, and decreased cognitive status with tangental thought process, he is recommended for SNF.   Pt has expressed that he feels he can go home, however uncertain of how much assistance wife is able to give due to chart stating that she is also chronically ill.  If he goes home, he will need 24/7 supervision/assistance, and HHPT.  He has also been instructed to use his RW.     Follow Up Recommendations SNF    Equipment Recommendations  None recommended by PT    Recommendations for Other Services       Precautions / Restrictions Precautions Precautions: Fall Precaution Comments: 3-4 falls with last fall ~8  days ago.  Pt states that most of the time his legs give out.  Restrictions Weight Bearing Restrictions: No      Mobility  Bed Mobility Overal bed mobility: Modified Independent Bed Mobility: Supine to Sit;Sit to Supine     Supine to sit: Modified independent (Device/Increase time) (bed flat) Sit to supine: Modified independent (Device/Increase time)      Transfers Overall transfer level: Needs assistance Equipment used: Straight cane Transfers: Sit to/from [facility] Sit to Stand: Min assist Stand pivot transfers: Mod assist (Due to  pt not getting lined up with the bed before he began to swing his buttocks into the bed.  )          Ambulation/Gait Ambulation/Gait assistance: Mod assist Ambulation Distance (Feet): 50 Feet Assistive device: Straight cane Gait Pattern/deviations: Step-through pattern;Wide base of support;Antalgic;Decreased stride length   Gait velocity interpretation: <1.8 ft/sec, indicative of risk for recurrent falls General Gait Details: Pt with crouched kneeds, holding the cane in R hand, and L UE is held in high guard position.  Pt demonstrates several episodes of unsteadiness, and PT stopped further gait, and requested to return to the room for safety.  Pt requested 1 standing rest break due to fatigue, and attempted to obtain an SpO2 reading at this time, however unable due to pulse ox not reading -cold hands.  Pt holding onto the wall raill on the left, and cane in the right hand upon return to the room.  Pt educated that he is not to use the cane anymore at home due to his unsteadiness, history of falls, and high risk for recurrent falls.    Stairs            Wheelchair Mobility    Modified Rankin (Stroke Patients Only)       Balance Overall balance assessment: Needs assistance;History of Falls Sitting-balance support: Bilateral upper extremity supported Sitting balance-Leahy Scale: Good     Standing balance support: Bilateral upper extremity supported Standing balance-Leahy Scale: Poor                               Pertinent Vitals/Pain Pain Assessment: No/denies pain    Home Living Family/patient expects to be discharged to:: Private residence Living Arrangements: Spouse/significant other Available Help at Discharge: Available PRN/intermittently (Pt states from the state, tech and therapy - for wife?,  hire Manufacturing systems engineer. ) Type of Home: House Home Access: Stairs to enter Entrance Stairs-Rails: Right;Left;Can reach both Entrance Stairs-Number of Steps: 3 with  HR Home Layout: One level Home Equipment: Cane - single point;Walker - 2 wheels;Shower seat;Bedside commode Additional Comments: Pt reports aide comes to help wife approximately 1x/week. Pt reports independence in ADL tasks.     Prior Function Level of Independence: Independent with assistive device(s)   Gait / Transfers Assistance Needed: Pt states he mostly uses the cane for ambulation.   ADL's / Homemaking Assistance Needed: independent with dressing/bathing.  Wife drives - pt states they go together to run errands.         Hand Dominance   Dominant Hand: Right    Extremity/Trunk Assessment   Upper Extremity Assessment: Defer to OT evaluation           Lower Extremity Assessment: Generalized weakness      Cervical / Trunk Assessment: Kyphotic  Communication   Communication: HOH  Cognition Arousal/Alertness: Lethargic Behavior During Therapy: Flat affect Overall Cognitive Status: Difficult to assess (states  he is here to get rid of the fluid around his heart.  States date is September, 2017)                      General Comments      Exercises     Assessment/Plan    PT Assessment Patient needs continued PT services  PT Problem List Decreased strength;Decreased activity tolerance;Decreased balance;Decreased mobility;Decreased coordination;Decreased cognition;Decreased knowledge of use of DME;Decreased safety awareness;Decreased knowledge of precautions;Cardiopulmonary status limiting activity          PT Treatment Interventions DME instruction;Gait training;Stair training;Functional mobility training;Therapeutic activities;Therapeutic exercise;Balance training;Patient/family education;Cognitive remediation    PT Goals (Current goals can be found in the Care Plan section)  Acute Rehab PT Goals Patient Stated Goal: Pt states he believes he can go home.  PT Goal Formulation: With patient Time For Goal Achievement: 07/06/16 Potential to Achieve Goals:  Fair    Frequency Min 4X/week   Barriers to discharge Decreased caregiver support Wife is not present, however chart states that she is also chronically ill.  Pt does report that they are receiving some sort of home health services, but is unable to tell me if this is for him or his wife.     Co-evaluation               End of Session Equipment Utilized During Treatment: Gait belt Activity Tolerance: Patient limited by fatigue Patient left: in bed;with bed alarm set;with call bell/phone within reach Nurse Communication: Mobility status;Precautions Magda Paganini, RN notified of pt's mobility, and possible Spo2 desaturation during mobility. )    Functional Assessment Tool Used: [employer] "6-clicks"  Functional Limitation: Mobility: Walking and moving around Mobility: Walking and Moving Around Current Status [phone]): At least 40 percent but less than 60 percent impaired, limited or restricted Mobility: Walking and Moving Around Goal Status [phone]): At least 20 percent but less than 40 percent impaired, limited or restricted    Time: [id number] PT Time Calculation (min) (ACUTE ONLY): 30 min  PT Diagnosis:  Other abnormalities of gait and mobility (R26.89), muscle weakness (generalized)(M62.81), and history of falling (Z91.81) Charges:   PT Evaluation $PT Eval Moderate Complexity: 1 Procedure PT Treatments $Gait Training: 8-22 mins   PT G Codes:   PT G-Codes **NOT FOR INPATIENT CLASS** Functional Assessment Tool Used: [employer] "6-clicks"  Functional Limitation: Mobility: Walking and moving around Mobility: Walking and Moving Around Current Status [phone]): At least 40 percent but less than 60 percent impaired, limited or restricted Mobility: Walking and Moving Around Goal Status [phone]): At least 20 percent but less than 40 percent impaired, limited or restricted   [name], PT, DPT X: [phone]

## 2016-06-29 NOTE — Clinical Social Work Note (Addendum)
Clinical Social Work Assessment  Patient Details  Name: [name] MRN: [id number] Date of Birth: 19-Dec-1925  Date of referral:  06/29/16               Reason for consult:  Discharge Planning                Permission sought to share information with:    Permission granted to share information::     Name::        Agency::     Relationship::     Contact Information:     Housing/Transportation Living arrangements for the past 2 months:  Single Family Home Source of Information:  Spouse Patient Interpreter Needed:  None Criminal Activity/Legal Involvement Pertinent to Current Situation/Hospitalization:  No - Comment as needed Significant Relationships:  Adult Children, Spouse Lives with:  Spouse Do you feel safe going back to the place where you live?  Yes Need for family participation in patient care:  Yes (Comment)  Care giving concerns:  PT recommending SNF, but pt does not meet 3 day qualifying stay.    Social Worker assessment / plan:  CSW spoke with pt's wife on phone as pt has dementia and is oriented to self only per chart. Pt's wife reports that they manage well at home. They have been married about 20 years and pt has 2 sons and she has 2 daughters. Wife's daughter lives up the road and is most involved and helpful. They have also hired someone to help with the house and cook. She also provides assistance to pt when needed. At baseline, pt ambulates with a cane. CSW discussed PT evaluation recommending SNF. Also shared Medicare coverage/criteria for SNF and that pt would need to pay privately for placement. She states that placement is not necessary at this time and wants pt to return home. Pt's wife was open to home health services which CM discussed with her. CSW will sign off.   Employment status:  Retired Forensic scientist:  [employer] PT Recommendations:  [place] / Referral to community resources:  [place], Other (Comment  Required) (ALF)  Patient/Family's Response to care:  Pt's wife feels comfortable with pt returning home with home health.   Patient/Family's Understanding of and Emotional Response to Diagnosis, Current Treatment, and Prognosis:  Unsure of pt's wife insight into pt's medical issues and progression of dementia.   Emotional Assessment Appearance:  Appears stated age Attitude/Demeanor/Rapport:  Unable to Assess Affect (typically observed):  Unable to Assess Orientation:  Oriented to Self Alcohol / Substance use:  Not Applicable Psych involvement (Current and /or in the community):  No (Comment)  Discharge Needs  Concerns to be addressed:  Discharge Planning Concerns Readmission within the last 30 days:  No Current discharge risk:  Cognitively Impaired Barriers to Discharge:  No Barriers Identified   Salome Arnt, [place] 06/29/2016, 11:10 AM [phone]

## 2016-06-29 NOTE — Progress Notes (Signed)
[name] discharged Home per MD order.  Discharge instructions reviewed and discussed with the patient and wife, all questions and concerns answered. Copy of instructions given to patient.    Medication List    STOP taking these medications   amLODipine 2.5 MG tablet Commonly known as:  NORVASC     TAKE these medications   acetaminophen 650 MG CR tablet Commonly known as:  TYLENOL Take 650 mg by mouth every 8 (eight) hours as needed for pain.   aspirin 81 MG EC tablet Take 1 tablet (81 mg total) by mouth daily.   busPIRone 5 MG tablet Commonly known as:  BUSPAR TAKE 1 TABLET BY MOUTH EVERY DAY AS NEEDED FOR ANXIETY   Carboxymethylcellul-Glycerin 0.5-0.9 % Soln Apply 1 drop to eye daily. One drop both eyes   CENTRUM SILVER 50+MEN Tabs Take 1 tablet by mouth daily. Daily   diclofenac sodium 1 % Gel Commonly known as:  VOLTAREN Apply 2 g topically daily as needed (pain). Reported on 01/05/2016   dorzolamide 2 % ophthalmic solution Commonly known as:  TRUSOPT Place 1 drop into both eyes daily. One drop both eyes   ENSURE Take 237 mLs by mouth daily. Pt takes daily   fentaNYL 25 MCG/HR patch Commonly known as:  DURAGESIC - dosed mcg/hr Place 1 patch (25 mcg total) onto the skin every 3 (three) days.   furosemide 20 MG tablet Commonly known as:  LASIX Take 1 tablet (20 mg total) by mouth daily. May take an additional 20 mg in the afternoon AS NEEDED   ibuprofen 200 MG tablet Commonly known as:  ADVIL,MOTRIN Take 400 mg by mouth every 6 (six) hours as needed for moderate pain.   latanoprost 0.005 % ophthalmic solution Commonly known as:  XALATAN Place 1 drop into both eyes at bedtime. Both eyes   nitrofurantoin (macrocrystal-monohydrate) 100 MG capsule Commonly known as:  MACROBID Take 1 capsule (100 mg total) by mouth every 12 (twelve) hours.   polyethylene glycol powder powder Commonly known as:  GLYCOLAX/MIRALAX Take 17 g by mouth daily. What changed:  when  to take this  reasons to take this   potassium chloride 10 MEQ tablet Commonly known as:  K-DUR,KLOR-CON Take 1 tablet (10 mEq total) by mouth daily.   URIBEL 118 MG Caps Take 1 capsule (118 mg total) by mouth 2 (two) times daily as needed (bladder).       Patients skin is clean, dry and intact, no evidence of skin break down. IV site discontinued and catheter remains intact. Site without signs and symptoms of complications. Dressing and pressure applied.  Patient escorted to car in a wheelchair,  no distress noted upon discharge.  [name] [name] 06/29/2016 3:56 PM

## 2016-06-29 NOTE — Evaluation (Signed)
Occupational Therapy Evaluation Patient Details Name: [name] MRN: [id number] DOB: 1926-07-08 Today's Date: 06/29/2016    History of Present Illness [name] is a 82 y.o. male with medical history significant of chronic systolic heart failure with LVEF with EF of 55-60% per echo in 2016, atrial fibrillation but not on anticoagulation due to history of GI bleed, intolerant to beta blockers due to side effects. History of mitral regurgitation moderate to severe by echo in 10 2016, tricuspid regurg moderate to severe by echo with PASD of 69, mild RV dysfunction,and history of biventricular pacemaker placed by Dr. Lovena Le and managed by his clinic who presented to the emergency department for leg swelling and shortness of breath.  Patient also has a history of dementia and per last cardiology note has become more and more debilitated over the past few months.  Plan after that visit was to follow up with Dr. Moshe Cipro to discuss goals of care, prognosis and possible placement.  Patient is a poor historian however, EDP was able to elicit some history of his wife but unfortunately she is chronically ill as well.  Patient reports that he has been trying to avoid taking his lasix at home because he dislikes having to urinate.  He mentions occasionally he can trick his wife and not take it but often he does take it.  He voices his last dose could have been this morning but he isn't sure.  He has been short of breath, has had back pain and swelling of the legs and feet.  Back pain has been present for about 1 week after a fall- patient reports numerous falls.  Legs and feet swelling has been going on for 2-3 weeks and states that taking lasix occasionally doesn't help.  He has also had chest pain occasionally on and off for a week which patient did not relate to exertion or not.   Clinical Impression   Pt awake, alert, oriented to person, time, and place this am, agreeable to OT evaluation. Pt history is  unclear as pt has hx of dementia and no caregiver/family present for evaluation. Pt reports independence in ADL tasks, however during evaluation required min guard for safety and consistent verbal cuing for sequencing tasks and locating necessary items. Difficulty locating items could be due to vision issues including glaucoma. Pt will benefit from continued OT services to determine safety and baseline level of functioning with ADL and functional mobility tasks. Recommend SNF on discharge as pt is unsafe to return home without assistance at this time and will benefit from skilled rehab services to improve independence and safety in ADL completion and functional tasks.     Follow Up Recommendations    SNF   Equipment Recommendations  None recommended by OT       Precautions / Restrictions Precautions Precautions: Fall Precaution Comments: 3-4 falls with last fall ~8 days ago.  Pt states that most of the time his legs give out.  Restrictions Weight Bearing Restrictions: No      Mobility Bed Mobility Overal bed mobility: Needs Assistance Bed Mobility: Supine to Sit;Sit to Supine     Supine to sit: Supervision Sit to supine: Supervision      Transfers Overall transfer level: Needs assistance Equipment used: Rolling walker (2 wheeled) Transfers: Sit to/from Stand Sit to Stand: Min assist;From elevated surface                   ADL Overall ADL's : Needs assistance/impaired Eating/Feeding: Set up;Cueing  for sequencing;Bed level   Grooming: Wash/dry hands;Min guard;Cueing for sequencing;Standing               Lower Body Dressing: Supervision/safety;Sitting/lateral leans               Functional mobility during ADLs: Min guard;Cueing for safety;Cueing for sequencing;Rolling walker                 Pertinent Vitals/Pain Pain Assessment: No/denies pain     Hand Dominance Right   Extremity/Trunk Assessment Upper Extremity Assessment Upper Extremity  Assessment: Generalized weakness   Lower Extremity Assessment Lower Extremity Assessment: Defer to PT evaluation       Communication Communication Communication: HOH   Cognition Arousal/Alertness: Lethargic Behavior During Therapy: Flat affect Overall Cognitive Status: Within Functional Limits for tasks assessed (states he is here to get rid of the fluid around his heart.  States date is September, 2017)                                Home Living Family/patient expects to be discharged to:: Private residence Living Arrangements: Spouse/significant other Available Help at Discharge: Available PRN/intermittently (Pt states from the state, tech and therapy - for wife?,  hire Manufacturing systems engineer. ) Type of Home: House Home Access: Stairs to enter [employer] of Steps: 3 with HR Entrance Stairs-Rails: Right;Left;Can reach both Home Layout: One level     Bathroom Shower/Tub: Teacher, early years/pre: [place] - single point;Walker - 2 wheels;Shower seat;Bedside commode   Additional Comments: Pt reports aide comes to help wife approximately 1x/week. Pt reports independence in ADL tasks.       Prior Functioning/Environment Level of Independence: Independent with assistive device(s)  Gait / Transfers Assistance Needed: Pt states he mostly uses the cane for ambulation.  ADL's / Homemaking Assistance Needed: independent with dressing/bathing.  Wife drives - pt states they go together to run errands.             OT Problem List: Decreased strength;Decreased activity tolerance;Impaired balance (sitting and/or standing);Decreased cognition;Decreased knowledge of use of DME or AE;Decreased safety awareness   OT Treatment/Interventions: Self-care/ADL training;Therapeutic exercise;Energy conservation;DME and/or AE instruction;Therapeutic activities;Patient/family education    OT Goals(Current goals can be found in the care plan section)     OT Frequency: Min 2X/week   Barriers to D/C: Decreased caregiver support  level of supervision/assistance at home unclear          End of Session Equipment Utilized During Treatment: Gait belt;Rolling walker  Activity Tolerance: Patient tolerated treatment well Patient left: in bed;with call bell/phone within reach;with bed alarm set   Time: [id number] OT Time Calculation (min): 35 min Charges:  OT General Charges $OT Visit: 1 Procedure OT Evaluation $OT Eval Low Complexity: 1 Procedure G-Codes: OT G-codes **NOT FOR INPATIENT CLASS** Functional Assessment Tool Used: clinical judgement Functional Limitation: Self care Self Care Current Status [id number]): At least 40 percent but less than 60 percent impaired, limited or restricted Self Care Goal Status [id number]): At least 40 percent but less than 60 percent impaired, limited or restricted   Guadelupe Sabin, OTR/L  [phone] 06/29/2016, 9:28 AM

## 2016-06-29 NOTE — Discharge Summary (Signed)
Physician Discharge Summary  [name] [id number] DOB: Jul 04, 1926 DOA: 06/28/2016  PCP: [name], MD  Admit date: 06/28/2016 Discharge date: 06/29/2016  Admitted From: Home Disposition:  Home  Recommendations for Outpatient Follow-up:  1. Follow up with PCP within 1 week 2. Follow up on urine culture 3. Take Lasix as prescribed 4. Please obtain BMP in one week   Home Health: Yes- PT, HH, RN, CSW  Equipment/Devices: None (has walker, cane and oxygen at home)  Discharge Condition: Stable CODE STATUS:Full Code Diet recommendation: Heart Healthy  Brief/Interim Summary: [name] is a 82 y.o. male with medical history significant of chronic systolic heart failure with LVEF with EF of 55-60% per echo in 2016, atrial fibrillation but not on anticoagulation due to history of GI bleed, intolerant to beta blockers due to side effects. History of mitral regurgitation moderate to severe by echo in 10 2016, tricuspid regurg moderate to severe by echo with PASD of 69, mild RV dysfunction,and history of biventricular pacemaker placed by Dr. Lovena Le and managed by his clinic who presented to the emergency department for leg swelling and shortness of breath.  Patient also has a history of dementia and per last cardiology note has become more and more debilitated over the past few months.  Plan after that visit was to follow up with Dr. Moshe Cipro to discuss goals of care, prognosis and possible placement.  Patient is a poor historian however, EDP was able to elicit some history of his wife but unfortunately she is chronically ill as well.  Patient reports that he has been trying to avoid taking his lasix at home because he dislikes having to urinate.  He mentions occasionally he can trick his wife and not take it but often he does take it.  He voices his last dose could have been this morning but he isn't sure.  He has been short of breath, has had back pain and swelling of the legs and feet.  Back  pain has been present for about 1 week after a fall- patient reports numerous falls.  Legs and feet swelling has been going on for 2-3 weeks and states that taking lasix occasionally doesn't help.  He has also had chest pain occasionally on and off for a week which patient did not relate to exertion or not.  No nausea, no vomiting, no abdominal pain, occasional urinary retention.  Patient seen and evaluated by EDP.  BNP was elevated to 723 and troponin was elevate at 0.49.  Troponin appears to be chronically elevated around that level.  EKG shows a ventricular paced rhythmn.  CBC showed pancytopenia which appears to be chronic.  Patient given 1 dose of 40mg  IV Lasix in ED and one additional dose of 20mg  IV on am of discharge.  Encouraged patient to take lasix at home as well as potassium supplement.  He was found to have an urinary tract infection which he has been treated for in the past.  Same pathogen suspected and so treated with antibiotic per the last sensitivity done.   Discharge Diagnoses:  Principal Problem:   CHF (congestive heart failure) (HCC) Active Problems:   Pancytopenia (HCC)   Glaucoma   Central hearing loss   MITRAL REGURGITATION, MODERATE   Alzheimer's dementia   Recurrent falls    Discharge Instructions  Discharge Instructions    Call MD for:  difficulty breathing, headache or visual disturbances    Complete by:  As directed    Call MD for:  extreme fatigue  Complete by:  As directed    Call MD for:  persistant dizziness or light-headedness    Complete by:  As directed    Call MD for:  persistant nausea and vomiting    Complete by:  As directed    Diet - low sodium heart healthy    Complete by:  As directed    Increase activity slowly    Complete by:  As directed        Medication List    STOP taking these medications   amLODipine 2.5 MG tablet Commonly known as:  NORVASC     TAKE these medications   acetaminophen 650 MG CR tablet Commonly known as:   TYLENOL Take 650 mg by mouth every 8 (eight) hours as needed for pain.   aspirin 81 MG EC tablet Take 1 tablet (81 mg total) by mouth daily.   busPIRone 5 MG tablet Commonly known as:  BUSPAR TAKE 1 TABLET BY MOUTH EVERY DAY AS NEEDED FOR ANXIETY   Carboxymethylcellul-Glycerin 0.5-0.9 % Soln Apply 1 drop to eye daily. One drop both eyes   CENTRUM SILVER 50+MEN Tabs Take 1 tablet by mouth daily. Daily   diclofenac sodium 1 % Gel Commonly known as:  VOLTAREN Apply 2 g topically daily as needed (pain). Reported on 01/05/2016   dorzolamide 2 % ophthalmic solution Commonly known as:  TRUSOPT Place 1 drop into both eyes daily. One drop both eyes   ENSURE Take 237 mLs by mouth daily. Pt takes daily   fentaNYL 25 MCG/HR patch Commonly known as:  DURAGESIC - dosed mcg/hr Place 1 patch (25 mcg total) onto the skin every 3 (three) days.   furosemide 20 MG tablet Commonly known as:  LASIX Take 1 tablet (20 mg total) by mouth daily. May take an additional 20 mg in the afternoon AS NEEDED   ibuprofen 200 MG tablet Commonly known as:  ADVIL,MOTRIN Take 400 mg by mouth every 6 (six) hours as needed for moderate pain.   latanoprost 0.005 % ophthalmic solution Commonly known as:  XALATAN Place 1 drop into both eyes at bedtime. Both eyes   nitrofurantoin (macrocrystal-monohydrate) 100 MG capsule Commonly known as:  MACROBID Take 1 capsule (100 mg total) by mouth every 12 (twelve) hours.   polyethylene glycol powder powder Commonly known as:  GLYCOLAX/MIRALAX Take 17 g by mouth daily. What changed:  when to take this  reasons to take this   potassium chloride 10 MEQ tablet Commonly known as:  K-DUR,KLOR-CON Take 1 tablet (10 mEq total) by mouth daily.   URIBEL 118 MG Caps Take 1 capsule (118 mg total) by mouth 2 (two) times daily as needed (bladder).      Follow-up Information    [name], MD. Schedule an appointment as soon as possible for a visit in 1 week(s).    Specialty:  Family Medicine Contact information: [address] [place] [address] [phone]        Rickard Patience, MD .   Specialty:  Surgery Contact information: [address] [id number] [facility] [place] [address] [phone]           Consultations:  None   Procedures/Studies: Dg Chest 2 View  Result Date: 06/28/2016 CLINICAL DATA:  Lower extremity edema, shortness of breath for 2 days EXAM: CHEST  2 VIEW COMPARISON:  CT chest 03/15/2016 FINDINGS: There is mild bilateral interstitial prominence. There is no focal parenchymal opacity. There is no pleural effusion or pneumothorax. There  is stable cardiomegaly. There is prominence of the central pulmonary vasculature. There is a 3 lead cardiac pacemaker. There is thoracic aortic atherosclerosis. The osseous structures are unremarkable. IMPRESSION: No active cardiopulmonary disease. Electronically Signed   By: Kathreen Devoid   On: 06/28/2016 13:19       Subjective: Patient sitting up eating lunch at time of exam.  Reports "I feel 100% better today".  Follows up by saying he will no longer skip taking his lasix.  He mentions even though it makes him have to urinate frequently he feels so much better it must be working.  States he already has an appointment with cardiology but cannot remember when.  Discharge Exam: Vitals:   06/29/16 0446 06/29/16 1314  BP: (!) 128/95 114/74  Pulse: 69 65  Resp: 18 20  Temp: 97.4 F (36.3 C) 97.5 F (36.4 C)   Vitals:   06/28/16 2000 06/28/16 2135 06/29/16 0446 06/29/16 1314  BP: 131/94 123/85 (!) 128/95 114/74  Pulse: 64 62 69 65  Resp: 11 19 18 20   Temp:  98.2 F (36.8 C) 97.4 F (36.3 C) 97.5 F (36.4 C)  TempSrc:  Oral Axillary Oral  SpO2: 95% 94% 98% 100%  Weight:  72.3 kg (159 lb 8 oz) 71.8 kg (158 lb 6.4 oz)   Height:        General: Pt is alert, awake, not in acute distress Cardiovascular: RRR, S1/S2 +, no rubs, no gallops Respiratory:  CTA bilaterally, no wheezing, no rhonchi Abdominal: Soft, NT, ND, bowel sounds + Extremities: no edema, no cyanosis    The results of significant diagnostics from this hospitalization (including imaging, microbiology, ancillary and laboratory) are listed below for reference.     Microbiology: No results found for this or any previous visit (from the past 240 hour(s)).   Labs: BNP (last 3 results)  Recent Labs  10/01/15 1653 05/10/16 1156 06/28/16 1327  BNP 583.4* 1,122.3* [id number]*   Basic Metabolic Panel:  Recent Labs Lab 06/28/16 1327 06/29/16 0325  NA 137 139  K 3.5 3.4*  CL 111 110  CO2 27 29  GLUCOSE 113* 106*  BUN 26* 25*  CREATININE 1.07 0.94  CALCIUM 8.3* 8.2*   Liver Function Tests: No results for input(s): AST, ALT, ALKPHOS, BILITOT, PROT, ALBUMIN in the last 168 hours. No results for input(s): LIPASE, AMYLASE in the last 168 hours. No results for input(s): AMMONIA in the last 168 hours. CBC:  Recent Labs Lab 06/28/16 1327  WBC 2.4*  HGB 9.9*  HCT 30.8*  MCV 100.3*  PLT 85*   Cardiac Enzymes:  Recent Labs Lab 06/28/16 1327 06/28/16 2114 06/29/16 0325 06/29/16 0902  TROPONINI 0.49* 0.45* 0.46* 0.47*   BNP: Invalid input(s): POCBNP CBG: No results for input(s): GLUCAP in the last 168 hours. D-Dimer No results for input(s): DDIMER in the last 72 hours. Hgb A1c No results for input(s): HGBA1C in the last 72 hours. Lipid Profile No results for input(s): CHOL, HDL, LDLCALC, TRIG, CHOLHDL, LDLDIRECT in the last 72 hours. Thyroid function studies No results for input(s): TSH, T4TOTAL, T3FREE, THYROIDAB in the last 72 hours.  Invalid input(s): FREET3 Anemia work up No results for input(s): VITAMINB12, FOLATE, FERRITIN, TIBC, IRON, RETICCTPCT in the last 72 hours. Urinalysis    Component Value Date/Time   COLORURINE GREEN (A) 06/28/2016 1808   APPEARANCEUR CLEAR 06/28/2016 1808   LABSPEC 1.010 06/28/2016 1808   PHURINE 6.5 06/28/2016  1808   GLUCOSEU NEGATIVE 06/28/2016 1808   HGBUR  NEGATIVE 06/28/2016 1808   BILIRUBINUR SMALL (A) 06/28/2016 1808   BILIRUBINUR neg 12/28/2013 1142   KETONESUR NEGATIVE 06/28/2016 1808   PROTEINUR NEGATIVE 06/28/2016 1808   UROBILINOGEN 1.0 12/28/2013 1142   NITRITE NEGATIVE 06/28/2016 1808   LEUKOCYTESUR TRACE (A) 06/28/2016 1808   Sepsis Labs Invalid input(s): PROCALCITONIN,  WBC,  LACTICIDVEN Microbiology No results found for this or any previous visit (from the past 240 hour(s)).   Time coordinating discharge: Less than 30 minutes  SIGNED:   Newman Pies, MD  [facility] 06/29/2016, 2:17 PM Pager [phone] If 7PM-7AM, please contact night-coverage [email or web address] Password [email or web address]

## 2016-06-29 NOTE — Care Management Note (Signed)
Case Management Note  Patient Details  Name: Kerven Steinhardt MRN: [id number] Date of Birth: 05-01-1926  Subjective/Objective: Patient adm from home with CHF. He has a walker at home. Spoke with his wife over the phone, she states patient does have a PCP and insurance. He has home oxygen. PT has recommended SNF, wife and patient are not agreeable. Will arrange [facility] services, RN, CSW, and PT. Offered choice.             Action/Plan: Romualdo Bolk of [facility] notified and will obtain orders from chart. Patient and wife aware that [facility] has 48 hours to make first visit.    Expected Discharge Date:  06/29/16               Expected Discharge Plan:  [place]  In-House Referral:  Clinical Social Work  Discharge planning Services  CM Consult  Post Acute Care Choice:  NA Choice offered to:  Spouse  DME Arranged:    DME Agency:     HH Arranged:  RN, PT, Social Work [employer] Agency:  [place]  Status of Service:  Completed, signed off  If discussed at [employer], dates discussed:    Additional Comments:  Shakhia Gramajo, Chauncey [place], RN 06/29/2016, 11:10 AM

## 2016-06-30 ENCOUNTER — Other Ambulatory Visit: Payer: Self-pay | Admitting: *Deleted

## 2016-06-30 NOTE — Patient Outreach (Signed)
Transition of care call successful, follow up on pt's recent hospitalization 9/25-9/26 for leg swelling, sob, CHF.  This pt has been receiving community nurse case management services.   Spoke with pt's spouse Guy Franco (on consent form),HIPAA verified, reports on pt's recent hospitalization- at home  would not always take his fluid pill.  Spouse reports pt is becoming more confused.  Spouse reports pt was breathing hard last night in bed, but  not all the time, did not apply oxygen.  RN CM discussed with spouse pt's use of oxygen as needed.  Spouse reports pt has no sob when ambulating, a little swelling top of feet.  RN CM reviewed with spouse pt's MD follow up appointments (Heart MD, Primary Care MD).   RN CM discussed with spouse plan to follow pt for transition of care- 31 days (includes weekly phone calls), home visit already scheduled.    Patient was recently discharged from hospital and all medications have been reviewed.  Plan:  As discussed with spouse, plan to follow up again next week telephonically  as part of ongoing  Transition of care.      [name].   [facility]  [phone]

## 2016-07-01 ENCOUNTER — Ambulatory Visit (INDEPENDENT_AMBULATORY_CARE_PROVIDER_SITE_OTHER): Payer: Medicare Other | Admitting: Adult Health

## 2016-07-01 ENCOUNTER — Encounter: Payer: Self-pay | Admitting: Adult Health

## 2016-07-01 ENCOUNTER — Other Ambulatory Visit (HOSPITAL_COMMUNITY)
Admission: RE | Admit: 2016-07-01 | Discharge: 2016-07-01 | Disposition: A | Discharge status: Home / Self Care | Payer: Medicare Other | Source: Ambulatory Visit | Attending: Adult Health | Admitting: Adult Health

## 2016-07-01 VITALS — BP 112/72 | HR 64 | Ht 70.0 in | Wt 157.0 lb

## 2016-07-01 DIAGNOSIS — I428 Other cardiomyopathies: Secondary | ICD-10-CM

## 2016-07-01 DIAGNOSIS — I4891 Unspecified atrial fibrillation: Secondary | ICD-10-CM | POA: Diagnosis not present

## 2016-07-01 DIAGNOSIS — I110 Hypertensive heart disease with heart failure: Secondary | ICD-10-CM | POA: Diagnosis not present

## 2016-07-01 DIAGNOSIS — Z950 Presence of cardiac pacemaker: Secondary | ICD-10-CM | POA: Diagnosis not present

## 2016-07-01 DIAGNOSIS — I5022 Chronic systolic (congestive) heart failure: Secondary | ICD-10-CM

## 2016-07-01 DIAGNOSIS — I482 Chronic atrial fibrillation, unspecified: Secondary | ICD-10-CM

## 2016-07-01 DIAGNOSIS — I429 Cardiomyopathy, unspecified: Secondary | ICD-10-CM

## 2016-07-01 DIAGNOSIS — Z79899 Other long term (current) drug therapy: Secondary | ICD-10-CM | POA: Diagnosis not present

## 2016-07-01 DIAGNOSIS — G309 Alzheimer's disease, unspecified: Secondary | ICD-10-CM | POA: Diagnosis not present

## 2016-07-01 DIAGNOSIS — F0280 Dementia in other diseases classified elsewhere without behavioral disturbance: Secondary | ICD-10-CM | POA: Diagnosis not present

## 2016-07-01 DIAGNOSIS — Z7982 Long term (current) use of aspirin: Secondary | ICD-10-CM | POA: Diagnosis not present

## 2016-07-01 DIAGNOSIS — I255 Ischemic cardiomyopathy: Secondary | ICD-10-CM

## 2016-07-01 DIAGNOSIS — H9190 Unspecified hearing loss, unspecified ear: Secondary | ICD-10-CM | POA: Diagnosis not present

## 2016-07-01 DIAGNOSIS — D61818 Other pancytopenia: Secondary | ICD-10-CM | POA: Diagnosis not present

## 2016-07-01 DIAGNOSIS — I340 Nonrheumatic mitral (valve) insufficiency: Secondary | ICD-10-CM | POA: Diagnosis not present

## 2016-07-01 DIAGNOSIS — Z9181 History of falling: Secondary | ICD-10-CM | POA: Diagnosis not present

## 2016-07-01 DIAGNOSIS — Z8719 Personal history of other diseases of the digestive system: Secondary | ICD-10-CM | POA: Diagnosis not present

## 2016-07-01 DIAGNOSIS — H409 Unspecified glaucoma: Secondary | ICD-10-CM | POA: Diagnosis not present

## 2016-07-01 LAB — URINE CULTURE: Culture: >=100000 — AB

## 2016-07-01 LAB — BASIC METABOLIC PANEL
Anion gap: 5 (ref 5–15)
BUN: 24 mg/dL — ABNORMAL HIGH (ref 6–20)
CHLORIDE: 105 mmol/L (ref 101–111)
CO2: 29 mmol/L (ref 22–32)
CREATININE: 0.98 mg/dL (ref 0.61–1.24)
Calcium: 8.6 mg/dL — ABNORMAL LOW (ref 8.9–10.3)
GFR calc non Af Amer: >60 mL/min (ref >60)
Glucose, Bld: 92 mg/dL (ref 65–99)
POTASSIUM: 3.6 mmol/L (ref 3.5–5.1)
SODIUM: 139 mmol/L (ref 135–145)

## 2016-07-01 NOTE — Progress Notes (Signed)
Name: [name]    DOB: Dec 07, 1925  Age: 82 y.o.  MR#: [id number]       PCP:  [name], MD      Insurance: Payor: MEDICARE / Plan: MEDICARE PART A AND B / Product Type: *No Product type* /   CC:   No chief complaint on file.   VS Vitals:   07/01/16 1518  Pulse: 64  SpO2: 97%  Weight: 157 lb (71.2 kg)  Height: 5\' 10"  (1.778 m)    Weights Current Weight  07/01/16 157 lb (71.2 kg)  06/29/16 158 lb 6.4 oz (71.8 kg)  06/16/16 160 lb (72.6 kg)    Blood Pressure  BP Readings from Last 3 Encounters:  06/29/16 114/74  06/16/16 128/84  06/11/16 108/84     Admit date:  (Not on file) Last encounter with RMR:  06/18/2016   Allergy Codeine and Penicillins  Current Outpatient Prescriptions  Medication Sig Dispense Refill  . acetaminophen (TYLENOL) 650 MG CR tablet Take 650 mg by mouth every 8 (eight) hours as needed for pain.     Marland Kitchen aspirin EC 81 MG EC tablet Take 1 tablet (81 mg total) by mouth daily.    . busPIRone (BUSPAR) 5 MG tablet TAKE 1 TABLET BY MOUTH EVERY DAY AS NEEDED FOR ANXIETY 30 tablet 3  . Carboxymethylcellul-Glycerin 0.5-0.9 % SOLN Apply 1 drop to eye daily. One drop both eyes     . diclofenac sodium (VOLTAREN) 1 % GEL Apply 2 g topically daily as needed (pain). Reported on 01/05/2016    . dorzolamide (TRUSOPT) 2 % ophthalmic solution Place 1 drop into both eyes daily. One drop both eyes     . ENSURE (ENSURE) Take 237 mLs by mouth daily. Pt takes daily     . fentaNYL (DURAGESIC - DOSED MCG/HR) 25 MCG/HR patch Place 1 patch (25 mcg total) onto the skin every 3 (three) days. 10 patch 0  . furosemide (LASIX) 20 MG tablet Take 1 tablet (20 mg total) by mouth daily. May take an additional 20 mg in the afternoon AS NEEDED 90 tablet 0  . ibuprofen (ADVIL,MOTRIN) 200 MG tablet Take 400 mg by mouth every 6 (six) hours as needed for moderate pain.    Marland Kitchen latanoprost (XALATAN) 0.005 % ophthalmic solution Place 1 drop into both eyes at bedtime. Both eyes     . Meth-Hyo-M Bl-Na  Phos-Ph Sal (URIBEL) 118 MG CAPS Take 1 capsule (118 mg total) by mouth 2 (two) times daily as needed (bladder). 30 capsule 0  . Multiple Vitamins-Minerals (CENTRUM SILVER 50+MEN) TABS Take 1 tablet by mouth daily. Daily     . nitrofurantoin, macrocrystal-monohydrate, (MACROBID) 100 MG capsule Take 1 capsule (100 mg total) by mouth every 12 (twelve) hours. 9 capsule 0  . polyethylene glycol powder (GLYCOLAX/MIRALAX) powder Take 17 g by mouth daily. (Patient taking differently: Take 17 g by mouth daily as needed for mild constipation. ) 3350 g 1  . potassium chloride (K-DUR,KLOR-CON) 10 MEQ tablet Take 1 tablet (10 mEq total) by mouth daily. 30 tablet 0   No current facility-administered medications for this visit.     Discontinued Meds:   There are no discontinued medications.  Patient Active Problem List   Diagnosis Date Noted  . CHF (congestive heart failure) ([place]) 06/28/2016  . Generalized anxiety disorder 04/05/2016  . Pigmented skin lesion 04/05/2016  . Acute CHF (congestive heart failure) ([place]) 03/15/2016  . Mitral regurgitation 03/15/2016  . Tricuspid regurgitation 03/15/2016  . Pulmonary hypertension (  [place]) 10/01/2015  . Fatigue 10/01/2015  . Chronic back pain 10/01/2015  . Myelodysplasia 06/20/2015  . Dyspnea 02/09/2015  . Recurrent falls 08/01/2014  . Atrial fibrillation, persistent ([place]) 02/22/2014  . Bilateral leg weakness 05/01/2013  . Abnormal gait 06/12/2012  . GERD (gastroesophageal reflux disease) 05/09/2012  . Alzheimer's dementia 11/17/2011  . Backache 02/18/2010  . GEN OSTEOARTHROSIS INVOLVING MULTIPLE SITES 11/25/2009  . Central hearing loss 11/10/2009  . Biventricular cardiac pacemaker in situ 07/22/2009  . INTERSTITIAL CYSTITIS 07/14/2009  . Pancytopenia ([place]) 12/31/2008  . MITRAL REGURGITATION, MODERATE 12/31/2008  . Chronic systolic heart failure ([place]) 12/31/2008  . ERECTILE DYSFUNCTION 01/31/2008  . Glaucoma 01/31/2008  . Essential hypertension  01/31/2008    LABS    Component Value Date/Time   NA 139 06/29/2016 0325   NA 137 06/28/2016 1327   NA 144 05/10/2016 1156   K 3.4 (L) 06/29/2016 0325   K 3.5 06/28/2016 1327   K 3.8 05/10/2016 1156   CL 110 06/29/2016 0325   CL 111 06/28/2016 1327   CL 107 05/10/2016 1156   CO2 29 06/29/2016 0325   CO2 27 06/28/2016 1327   CO2 28 05/10/2016 1156   GLUCOSE 106 (H) 06/29/2016 0325   GLUCOSE 113 (H) 06/28/2016 1327   GLUCOSE 115 (H) 05/10/2016 1156   BUN 25 (H) 06/29/2016 0325   BUN 26 (H) 06/28/2016 1327   BUN 22 05/10/2016 1156   CREATININE 0.94 06/29/2016 0325   CREATININE 1.07 06/28/2016 1327   CREATININE 1.08 05/10/2016 1156   CREATININE 1.13 04/09/2016 1627   CREATININE 1.01 04/05/2016 1421   CREATININE 0.94 10/01/2015 1653   CALCIUM 8.2 (L) 06/29/2016 0325   CALCIUM 8.3 (L) 06/28/2016 1327   CALCIUM 8.7 05/10/2016 1156   GFRNONAA >60 06/29/2016 0325   GFRNONAA >60 06/28/2016 1327   GFRNONAA 56 (L) 04/09/2016 1627   GFRAA >60 06/29/2016 0325   GFRAA >60 06/28/2016 1327   GFRAA >60 04/09/2016 1627   CMP     Component Value Date/Time   NA 139 06/29/2016 0325   K 3.4 (L) 06/29/2016 0325   CL 110 06/29/2016 0325   CO2 29 06/29/2016 0325   GLUCOSE 106 (H) 06/29/2016 0325   BUN 25 (H) 06/29/2016 0325   CREATININE 0.94 06/29/2016 0325   CREATININE 1.08 05/10/2016 1156   CALCIUM 8.2 (L) 06/29/2016 0325   PROT 6.5 02/14/2016 1542   ALBUMIN 3.5 02/14/2016 1542   AST 26 02/14/2016 1542   ALT 18 02/14/2016 1542   ALKPHOS 58 02/14/2016 1542   BILITOT 2.5 (H) 02/14/2016 1542   GFRNONAA >60 06/29/2016 0325   GFRAA >60 06/29/2016 0325       Component Value Date/Time   WBC 2.4 (L) 06/28/2016 1327   WBC 2.8 (L) 05/20/2016 1656   WBC 2.7 (L) 04/05/2016 1421   HGB 9.9 (L) 06/28/2016 1327   HGB 10.3 (L) 05/20/2016 1656   HGB 9.6 (L) 04/05/2016 1421   HCT 30.8 (L) 06/28/2016 1327   HCT 31.4 (L) 05/20/2016 1656   HCT 29.5 (L) 04/05/2016 1421   MCV 100.3 (H)  06/28/2016 1327   MCV 100.0 05/20/2016 1656   MCV 99.0 04/05/2016 1421    Lipid Panel     Component Value Date/Time   CHOL 179 06/19/2013 1241   TRIG 66 06/19/2013 1241   HDL 51 06/19/2013 1241   CHOLHDL 3.5 06/19/2013 1241   VLDL 13 06/19/2013 1241   LDLCALC 115 (H) 06/19/2013 1241    ABG  Component Value Date/Time   TCO2 26 03/01/2016 1952     Lab Results  Component Value Date   TSH 4.23 05/10/2016   BNP (last 3 results)  Recent Labs  10/01/15 1653 05/10/16 1156 06/28/16 1327  BNP 583.4* 1,122.3* 723.0*    ProBNP (last 3 results) No results for input(s): PROBNP in the last 8760 hours.  Cardiac Panel (last 3 results)  Recent Labs  06/28/16 2114 06/29/16 0325 06/29/16 0902  TROPONINI 0.45* 0.46* 0.47*    Iron/TIBC/Ferritin/ %Sat    Component Value Date/Time   IRON 78 12/29/2015 1313   TIBC 308 12/29/2015 1313   FERRITIN 60 12/29/2015 1313   IRONPCTSAT 25 12/29/2015 1313     EKG Orders placed or performed during the hospital encounter of 06/28/16  . ED EKG within 10 minutes  . ED EKG within 10 minutes  . EKG 12-Lead  . EKG 12-Lead  . EKG     Prior Assessment and Plan Problem List as of 07/01/2016 Reviewed: 06/16/2016  3:32 PM by Sanjuana Kava, MD     Cardiovascular and Mediastinum   MITRAL REGURGITATION, MODERATE   Last Assessment & Plan 08/08/2014 Office Visit Written 08/08/2014  4:06 PM by Lendon Colonel, NP    No apparent for repair or surgery at this time. Multiple comorbidities. Continue medical management.      Essential hypertension   Last Assessment & Plan 05/10/2016 Office Visit Written 05/16/2016  9:57 PM by Fayrene Helper, MD    Controlled, no change in medication       Chronic systolic heart failure [facility])   Last Assessment & Plan 02/20/2016 Office Visit Written 02/20/2016  3:36 PM by Fayrene Helper, MD    Increased shortness of breath  noted in pt with established heart failure and possible nocturnal wheezuing, needs  overnight pulse oximetry, will refer      Atrial fibrillation, persistent [facility])   Last Assessment & Plan 10/01/2015 Office Visit Written 10/01/2015  3:45 PM by Erlene Quan, PA-C    Unable to tolerate anticoagulation secondary to GI bleeding CHADs VASc=3      Pulmonary hypertension [facility])   Last Assessment & Plan 02/20/2016 Office Visit Written 02/20/2016  3:43 PM by Fayrene Helper, MD    Shortness of breath and wheezing noted a at night increasingly by spouse, refer for overnight pulse oximetry      Acute CHF (congestive heart failure) [facility])   Last Assessment & Plan 05/10/2016 Office Visit Written 05/16/2016  9:56 PM by Fayrene Helper, MD    Acute flare of symptoms this past weekend, reviewed with pt and spouse the importance of taking medication as prescribed,  Still not consistently taking his diuretic Will check BNP today based on symptom severity, reports he "felt like I was leaving here" and wife states she also gave him a NTG May need sooner cardiology appointment, will follow through      Mitral regurgitation   Tricuspid regurgitation   CHF (congestive heart failure) ([place])     Digestive   GERD (gastroesophageal reflux disease)   Last Assessment & Plan 05/10/2016 Office Visit Written 05/16/2016 10:01 PM by Fayrene Helper, MD    Controlled, no change in medication         Nervous and Auditory   Central hearing loss   Last Assessment & Plan 12/28/2013 Office Visit Written 12/29/2013  9:11 AM by Fayrene Helper, MD    needs hearing aids, will advise checking belltone  For  help with this      Alzheimer's dementia   Last Assessment & Plan 05/10/2016 Office Visit Written 05/16/2016  9:59 PM by Fayrene Helper, MD    Stable off of medication      Bilateral leg weakness   Last Assessment & Plan 09/09/2014 Office Visit Written 02/02/2015 10:59 AM by Fayrene Helper, MD    Improved with recent in house therapy, he is to continue out pt in home PT/OT  For next approx 4  weeks      Myelodysplasia   Last Assessment & Plan 11/03/2015 Office Visit Edited 11/03/2015  3:28 PM by Baird Cancer, PA-C    Pancytopenia in the setting of a bone marrow aspiration and biopsy on 0000000 by Dr. Sterling Big at [facility] demonstrating MDS with 22q deletion, normocellular marrow at 25% without evidence of melanoma, lymphoma, or morphologic evidence of MDS.  However, decreased iron stores were noted.  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  In November, his ferritin was less than 100 and his calculated iron deficit was approximately 370 mg.  He was therefore given ferric gluconate 125 mg x 2.   Oncology Flowsheet 09/08/2015 09/15/2015  ferric gluconate (NULECIT) IV 125 mg 125 mg    At this time, there is no need for ESA therapy.  However, if he anemia progresses, we could absolutely consider ESA treatment.  His HGB is stable without any significant change.  Leukopenia is noted.  No recent infections or hospitalizations associated with infections.  If this becomes an issue GSF support can be considered.  Not necessary now.  Stable.  Neutrophil count is stable without any major changes.  Re-dressing of right upper arm completed by myself and nursing.  Cutting of scab from Band-Aid was completed.  New dressing placed and recommended he keep dressing in place for 48 hours.  Labs in 8 weeks: CBC diff, CMET, iron/TIBC, ferritin.  Return in 8 weeks for follow-up.  Chart is reviewed.  I agree with SNF placement and will defer to primary care provider.        Musculoskeletal and Integument   GEN OSTEOARTHROSIS INVOLVING MULTIPLE SITES   Last Assessment & Plan 06/12/2015 Office Visit Written 07/09/2015  8:31 PM by Fayrene Helper, MD    Continue current medication      Pigmented skin lesion   Last Assessment & Plan 04/05/2016 Office Visit Written 04/10/2016 10:40 PM by Fayrene Helper, MD    Hyperpigmented lesion  under rigth 4th fingernail, increasing in size, also anterior chest lesion with varied pigmentation. Pt has h/o melanoma in past  Years. Refer to dermatology for skin evaluation        Genitourinary   INTERSTITIAL CYSTITIS   Last Assessment & Plan 06/12/2015 Office Visit Written 07/09/2015  8:36 PM by Fayrene Helper, MD    Followed by urology has implanted device as well as is on medication for symptoms, still c/o excessive nocturia and wants to stop his diuretic, the importance of maintaining medications as prescribed for heart failure protection is again discussed        Hematopoietic and Hemostatic   Pancytopenia [facility])   Last Assessment & Plan 05/10/2016 Office Visit Written 05/16/2016 10:01 PM by Fayrene Helper, MD    Unchanged, has ahd hematology eval in the past on several occasions        Other   ERECTILE DYSFUNCTION   Last Assessment & Plan 05/10/2011  Office Visit Written 05/10/2011 10:00 AM by Fayrene Helper, MD    Pt states that the cialis is not working as well, he still has a lot, advised him to discuss further with his urologist      Glaucoma   Last Assessment & Plan 02/25/2015 Office Visit Written 04/04/2015 11:06 PM by Fayrene Helper, MD    Followed by opthalmology and compliant with drops      Backache   Last Assessment & Plan 05/10/2016 Office Visit Written 05/16/2016  9:58 PM by Fayrene Helper, MD    Chronic and unchanged, managed in conjunction with orhtopedics      Biventricular cardiac pacemaker in situ   Last Assessment & Plan 11/12/2015 Office Visit Written 11/17/2015  2:09 PM by Fayrene Helper, MD    Current proposal per spouse is that she learn how to monitor from home, she is overwhelmed ans states she is incapable wants an alternative arrangement, requests [facility] help again      Abnormal gait   Last Assessment & Plan 11/12/2015 Office Visit Written 11/17/2015  2:01 PM by Fayrene Helper, MD    High fall risk and incapable of self care , will  refer to [facility] for evaluation for any possible assistance, he lives with his spouse who is also aged and less capable of independent living, however , they both still prefer to live in their own home together at this time      Recurrent falls   Last Assessment & Plan 02/20/2016 Office Visit Written 02/20/2016  3:42 PM by Fayrene Helper, MD    Increased weakness , recurrent falls, spouse states she is now ready to get in home health twice weekly, will ask for [facility] help  To navigate the process      Dyspnea   Last Assessment & Plan 01/06/2015 Office Visit Written 02/09/2015  5:28 PM by Fayrene Helper, MD    Recent increase with worsened  exercise tolerance and recurrent falls, pt sent to [facility] for further eval      Fatigue   Last Assessment & Plan 10/01/2015 Office Visit Edited 10/01/2015  4:01 PM by Erlene Quan, PA-C    His main complaint today is fatigue x 1 week. "just feel run down"      Chronic back pain   Last Assessment & Plan 04/05/2016 Office Visit Edited 04/10/2016 10:36 PM by Fayrene Helper, MD    Reports poor control on current regime, manged by ortho who he sees regularly, and he also is on fentanyl patch. No evidence of uncontrolled pain and discomfort at office visits      Generalized anxiety disorder   Last Assessment & Plan 04/05/2016 Office Visit Written 04/10/2016 10:34 PM by Fayrene Helper, MD    Uncontrolled , requests medication "like his wife's", low dose buspar prescribed          Imaging: Dg Chest 2 View  Result Date: 06/28/2016 CLINICAL DATA:  Lower extremity edema, shortness of breath for 2 days EXAM: CHEST  2 VIEW COMPARISON:  CT chest 03/15/2016 FINDINGS: There is mild bilateral interstitial prominence. There is no focal parenchymal opacity. There is no pleural effusion or pneumothorax. There is stable cardiomegaly. There is prominence of the central pulmonary vasculature. There is a 3 lead cardiac pacemaker. There is thoracic aortic atherosclerosis. The  osseous structures are unremarkable. IMPRESSION: No active cardiopulmonary disease. Electronically Signed   By: Kathreen Devoid   On: 06/28/2016 13:19

## 2016-07-01 NOTE — Patient Instructions (Signed)
Medication Instructions:  Your physician recommends that you continue on your current medications as directed. Please refer to the Current Medication list given to you today.   Labwork: Your physician recommends that you return for lab work in: today  bmet    Testing/Procedures: none  Follow-Up: Your physician recommends that you schedule a follow-up appointment in: 1 month   Any Other Special Instructions Will Be Listed Below (If Applicable).     If you need a refill on your cardiac medications before your next appointment, please call your pharmacy.

## 2016-07-01 NOTE — Progress Notes (Signed)
I Cardiology Office Note   Date:  07/01/2016   ID:  [name], DOB 08/21/1926, MRN [id number]  PCP:  [name], MD  Cardiologist: [name], NP   Chief Complaint  Patient presents with  . Congestive Heart Failure  . Hypertension      History of Present Illness: [name] is a 82 y.o. male who presents for posthospitalization follow-up after hospitalization for decompensated CHF. He had been medically noncompliant with diuretic. We have seen this patient often and he has a long history of dementia, debilitation, and is cared for by a wife who is very frail and debilitated as well. The patient was also treated for urinary tract infection.  He was sent home on Lasix 20 mg daily along with potassium. He was to follow-up with his primary care physician. The patient was seen by patient not reach in transition of care, with home health nurse to see him on a weekly basis. During hospitalization he was recommended for skilled nursing facility in both he and his wife are not agreeable. This is been an ongoing discussion with him for the last several years.  I spoke with Dr. Moshe Cipro via staff medicine she is in agreement that the patient needs to be placed in skilled nursing facility by she is undergoing the same obstacles concerning his refusal to be admitted there and his wife's agreement in that.  He states that he does not like taking Lasix and that it is better. He admits to spitting it out and hiding it after his wife gives it to them. He has become more compliant with medication now as he did not like being in the hospital. He fears that he'll be placed in a nursing facility. His wife states she is doing her best with him. He denies any chest discomfort shortness of breath or significant fatigue.  Past Medical History:  Diagnosis Date  . A-fib ([place])   . Alzheimer disease   . Arthritis   . Asymptomatic carotid artery stenosis    BILATERAL MILD ICA---  <50% PER DUPLEX  03-08-2008  . BPH (benign prostatic hypertrophy)   . Cancer ([place])   . Cardiac pacemaker    CARDIOLOGIST-- DR Cristopher Peru (LAST PACE [facility] 05-15-2013)  . CHB (complete heart block) ([place])   . Chronic back pain   . Chronic pancreatitis ([place])    Based on CT findings  . Chronic systolic heart failure ([place])   . Costochondritis, acute   . Dementia   . Erectile dysfunction   . Glaucoma   . History of melanoma in situ    JAN 2012--  LEFT HEEL W/ SLN BX  . History of syncope   . HOH (hard of hearing)   . Hypertension   . IC (interstitial cystitis)   . Nonischemic cardiomyopathy ([place])   . Pancytopenia   . Poor historian   . Rash and other nonspecific skin eruption   . Severe mitral regurgitation   . Urge urinary incontinence   . Visual changes     Past Surgical History:  Procedure Laterality Date  . BACK SURGERY  X3  LAST ONE'90's  . CATARACT EXTRACTION, BILATERAL  left  1997/   right 2003  . COLONOSCOPY  08/18/2006   [id number] granularity and erosions of the rectum of uncertain significance biopsied.  A long redundant, but otherwise normal-appearing colon.melanosi coli and minimal inflammation on bx  . COLONOSCOPY N/A 08/05/2014   Procedure: COLONOSCOPY;  Surgeon: [name], MD;  [facility]  [facility];  Service: Endoscopy;  Laterality: N/A;  PT NEEDS TCS AT 1000.  Marland Kitchen CYSTO/ HOD/  REPLACEMENT INTERSTIM IMPLANT  05-14-2003  . CYSTOSCOPY W/ RETROGRADES N/A 02/11/2016   Procedure: CYSTOSCOPY WITH RETROGRADE PYELOGRAM;  Surgeon: Cleon Gustin, MD;  Location: [facility] ORS;  Service: Urology;  Laterality: N/A;  . ESOPHAGOGASTRODUODENOSCOPY N/A 03/28/2014   Dr. [name]: normal esophagus s/p dilation, mosaic appearance - chronic inflammation noted on bx  . IMPLANTABLE CARDIOVERTER DEFIBRILLATOR (ICD) GENERATOR CHANGE N/A 10/11/2014   Procedure: ICD GENERATOR CHANGE;  Surgeon: [name], MD;  Location: [facility] CATH LAB;  Service: Cardiovascular;  Laterality: N/A;  . INGUINAL HERNIA REPAIR  Bilateral AS TEEN  . INTERSTIM IMPLANT PLACEMENT  2001  &  2007  . INTERSTIM IMPLANT REVISION N/A 08/28/2013   Procedure: REPLACEMENT OF INTERSTIM-GENERATOR AND LEAD ;  Surgeon: [name], MD;  Location: [place];  Service: Urology;  Laterality: N/A;  . LEFT ELBOW SURGERY  2002  . MALONEY DILATION N/A 03/28/2014   Procedure: Venia Minks DILATION;  Surgeon: [name], MD;  [facility];  Service: Endoscopy;  Laterality: N/A;  . PACEMAKER INSERTION  12-06-2008  DR [name]   BiV PPM  --  [employer]  . REMOVAL INTERSTIM IMPLANT/  CYST/  HOD  04-14-2004  . REVISION INTERSTIM IMPLANT AND REPLACE GENERATOR  05-15-2009   left upper buttock for urge urinary incontinence  . SAVORY DILATION N/A 03/28/2014   Procedure: SAVORY DILATION;  Surgeon: [name], MD;  [facility];  Service: Endoscopy;  Laterality: N/A;  . TONSILLECTOMY  1950  . TRANSTHORACIC ECHOCARDIOGRAM  12-06-2008   LVSF 55-60%/  MODERATE MR/  MILD AR/  QUESTION DISTAL POSTERIOR HYPOKINESIS  . TRANSURETHRAL RESECTION OF BLADDER TUMOR WITH GYRUS (TURBT-GYRUS) N/A 02/11/2016   Procedure: TRANSURETHRAL RESECTION OF BLADDER TUMOR WITH GYRUS (TURBT-GYRUS);  Surgeon: Cleon Gustin, MD;  Location: [facility];  Service: Urology;  Laterality: N/A;  . WIDE EXCISION LEFT HEEL AND SLN BX  JUNE 2012     Current Outpatient Prescriptions  Medication Sig Dispense Refill  . acetaminophen (TYLENOL) 650 MG CR tablet Take 650 mg by mouth every 8 (eight) hours as needed for pain.     Marland Kitchen aspirin EC 81 MG EC tablet Take 1 tablet (81 mg total) by mouth daily.    . busPIRone (BUSPAR) 5 MG tablet TAKE 1 TABLET BY MOUTH EVERY DAY AS NEEDED FOR ANXIETY 30 tablet 3  . Carboxymethylcellul-Glycerin 0.5-0.9 % SOLN Apply 1 drop to eye daily. One drop both eyes     . diclofenac sodium (VOLTAREN) 1 % GEL Apply 2 g topically daily as needed (pain). Reported on 01/05/2016    . dorzolamide (TRUSOPT) 2 % ophthalmic  solution Place 1 drop into both eyes daily. One drop both eyes     . ENSURE (ENSURE) Take 237 mLs by mouth daily. Pt takes daily     . fentaNYL (DURAGESIC - DOSED MCG/HR) 25 MCG/HR patch Place 1 patch (25 mcg total) onto the skin every 3 (three) days. 10 patch 0  . furosemide (LASIX) 20 MG tablet Take 1 tablet (20 mg total) by mouth daily. May take an additional 20 mg in the afternoon AS NEEDED 90 tablet 0  . ibuprofen (ADVIL,MOTRIN) 200 MG tablet Take 400 mg by mouth every 6 (six) hours as needed for moderate pain.    Marland Kitchen latanoprost (XALATAN) 0.005 % ophthalmic solution Place 1 drop into both eyes at  bedtime. Both eyes     . Meth-Hyo-M Bl-Na Phos-Ph Sal (URIBEL) 118 MG CAPS Take 1 capsule (118 mg total) by mouth 2 (two) times daily as needed (bladder). 30 capsule 0  . Multiple Vitamins-Minerals (CENTRUM SILVER 50+MEN) TABS Take 1 tablet by mouth daily. Daily     . nitrofurantoin, macrocrystal-monohydrate, (MACROBID) 100 MG capsule Take 1 capsule (100 mg total) by mouth every 12 (twelve) hours. 9 capsule 0  . polyethylene glycol powder (GLYCOLAX/MIRALAX) powder Take 17 g by mouth daily. (Patient taking differently: Take 17 g by mouth daily as needed for mild constipation. ) 3350 g 1  . potassium chloride (K-DUR,KLOR-CON) 10 MEQ tablet Take 1 tablet (10 mEq total) by mouth daily. 30 tablet 0   No current facility-administered medications for this visit.     Allergies:   Codeine and Penicillins    Social History:  The patient  reports that he quit smoking about 18 years ago. His smoking use included Cigarettes. He has a 0.60 pack-year smoking history. He has never used smokeless tobacco. He reports that he does not drink alcohol or use drugs.   Family History:  The patient's family history includes Cancer (age of onset: 41) in his father; Dementia in his mother; Heart disease in his brother; Lung cancer in his sister; Throat cancer in his father.    ROS: All other systems are reviewed and  negative. Unless otherwise mentioned in H&P    PHYSICAL EXAM: VS:  BP 112/72   Pulse 64   Ht 5\' 10"  (1.778 m)   Wt 157 lb (71.2 kg)   SpO2 97%   BMI 22.53 kg/m  , BMI Body mass index is 22.53 kg/m. GEN: Well nourished, well developed, in no acute distress  HEENT: normal  Neck: no JVD, carotid bruits, or masses Cardiac: IRRR; 2/6 systolic murmur heard best at the apex,, rubs, or gallops, 2+ pitting edema ankles bilaterally Respiratory:  Clear to auscultation bilaterally, normal work of breathing GI: soft, nontender, nondistended, + BS MS: no deformity or atrophy  Skin: warm and dry, no rash Neuro:  Strength and sensation are intact Psych: euthymic mood, full affect   Recent Labs: 02/14/2016: ALT 18 05/10/2016: TSH 4.23 06/28/2016: B Natriuretic Peptide 723.0; Hemoglobin 9.9; Platelets 85 06/29/2016: BUN 25; Creatinine, Ser 0.94; Potassium 3.4; Sodium 139    Lipid Panel    Component Value Date/Time   CHOL 179 06/19/2013 1241   TRIG 66 06/19/2013 1241   HDL 51 06/19/2013 1241   CHOLHDL 3.5 06/19/2013 1241   VLDL 13 06/19/2013 1241   LDLCALC 115 (H) 06/19/2013 1241      Wt Readings from Last 3 Encounters:  07/01/16 157 lb (71.2 kg)  06/29/16 158 lb 6.4 oz (71.8 kg)  06/16/16 160 lb (72.6 kg)      Other studies Reviewed: Additional studies/ records that were reviewed today include: Echocardiogram  Review of the above records demonstrates:  Left ventricle: The cavity size was normal. There was moderate   concentric hypertrophy. Systolic function was normal. The   estimated ejection fraction was in the range of 55% to 60%. Wall   motion was normal; there were no regional wall motion   abnormalities. - Aortic valve: Trileaflet; mildly thickened leaflets. There was   mild to moderate regurgitation. - Aorta: Mild aortic root dilatation. Aortic root dimension: 41 mm   (ED). - Mitral valve: Mild anterior leaflet prolapse. Moderately   thickened leaflets . Moderate to  severe, posteriorly directed   regurgitation. -  Left atrium: The atrium was severely dilated. - Right ventricle: Pacer wire or catheter noted in right ventricle.   Systolic function was mildly reduced. - Right atrium: The atrium was moderately to severely dilated.   Pacer wire or catheter noted in right atrium. - Tricuspid valve: There was moderate-severe regurgitation. - Pulmonic valve: There was mild regurgitation. - Pulmonary arteries: Systolic pressure was severely increased. PA   peak pressure: 69 mm Hg (S). - Systemic veins: Dilated IVC with normal respiratory variation.   Estimated CVP 8 mmHg. - Pericardium, extracardiac: Small pericardial effusion. No   evidence for tamponade physiology.    ASSESSMENT AND PLAN:  1. Chronic mixed CHF: Recent hospitalization for decompensation due to medical noncompliance. The patient has diuresis and is feeling much better. He admits to not swallowing his Lasix at his wife gives him and taking it out of his mouth and hiding it as he states he does not wish to urinate somewhat. However now that he was in the hospital and has begun to feel better he fears that he will be put back in the hospital and placed in a skilled nursing facility if he doesn't take his Lasix. He states that he will take his medicine as directed. I will order a BMET to check kidney function and potassium status.  2. Nonischemic cardiomyopathy: He has generalized weakness and fatigue. Uses a cane or a wheelchair for ambulation. His wife is concerned that he will stop walking. Physical therapy has been assigned to see him post hospitalization and I've encouraged him to work with them.  3. Atrial fibrillation: Heart rate is currently well-controlled. He is on aspirin only.  4. ICD in situ: Follow-up interrogation per protocol with Dr. Lovena Le. Most recent remote interrogation on 05/07/2016.   Current medicines are reviewed at length with the patient today.    Labs/ tests ordered  today include: BMET   Orders Placed This Encounter  Procedures  . Basic Metabolic Panel (BMET)     Disposition:   FU with Dr. Harl Bowie in one month.  Signed, Jory Sims, NP  07/01/2016 5:03 PM    [facility] [address] [place], [place] [address] Phone: [phone]; Fax: [phone]

## 2016-07-02 ENCOUNTER — Telehealth: Payer: Self-pay

## 2016-07-02 NOTE — Telephone Encounter (Signed)
-----   Message from Raylene Everts, MD sent at 07/01/2016 12:24 PM EDT ----- It looks like Mr. Sturms was discharged on Macrodantin. His urinary tract infection is sensitive to nitrofurantoin. He should be fine. Please call and see if he is symptomatic

## 2016-07-02 NOTE — Telephone Encounter (Signed)
Called and spoke with wife and pt still c/o burning but wife was not giving him antibiotic. Patient aware now that it is very important to take and finish medication to cure the uti. Advised to call back Monday if pt still symptomatic

## 2016-07-05 DIAGNOSIS — I110 Hypertensive heart disease with heart failure: Secondary | ICD-10-CM | POA: Diagnosis not present

## 2016-07-05 DIAGNOSIS — I5022 Chronic systolic (congestive) heart failure: Secondary | ICD-10-CM | POA: Diagnosis not present

## 2016-07-05 DIAGNOSIS — G309 Alzheimer's disease, unspecified: Secondary | ICD-10-CM | POA: Diagnosis not present

## 2016-07-05 DIAGNOSIS — I4891 Unspecified atrial fibrillation: Secondary | ICD-10-CM | POA: Diagnosis not present

## 2016-07-05 DIAGNOSIS — F0280 Dementia in other diseases classified elsewhere without behavioral disturbance: Secondary | ICD-10-CM | POA: Diagnosis not present

## 2016-07-05 DIAGNOSIS — D61818 Other pancytopenia: Secondary | ICD-10-CM | POA: Diagnosis not present

## 2016-07-06 ENCOUNTER — Ambulatory Visit: Payer: Self-pay | Admitting: Family Medicine

## 2016-07-06 DIAGNOSIS — I4891 Unspecified atrial fibrillation: Secondary | ICD-10-CM | POA: Diagnosis not present

## 2016-07-06 DIAGNOSIS — I110 Hypertensive heart disease with heart failure: Secondary | ICD-10-CM | POA: Diagnosis not present

## 2016-07-06 DIAGNOSIS — I5022 Chronic systolic (congestive) heart failure: Secondary | ICD-10-CM | POA: Diagnosis not present

## 2016-07-06 DIAGNOSIS — D61818 Other pancytopenia: Secondary | ICD-10-CM | POA: Diagnosis not present

## 2016-07-06 DIAGNOSIS — F0280 Dementia in other diseases classified elsewhere without behavioral disturbance: Secondary | ICD-10-CM | POA: Diagnosis not present

## 2016-07-06 DIAGNOSIS — G309 Alzheimer's disease, unspecified: Secondary | ICD-10-CM | POA: Diagnosis not present

## 2016-07-07 ENCOUNTER — Other Ambulatory Visit: Payer: Self-pay | Admitting: *Deleted

## 2016-07-07 DIAGNOSIS — I110 Hypertensive heart disease with heart failure: Secondary | ICD-10-CM | POA: Diagnosis not present

## 2016-07-07 DIAGNOSIS — F0280 Dementia in other diseases classified elsewhere without behavioral disturbance: Secondary | ICD-10-CM | POA: Diagnosis not present

## 2016-07-07 DIAGNOSIS — D61818 Other pancytopenia: Secondary | ICD-10-CM | POA: Diagnosis not present

## 2016-07-07 DIAGNOSIS — I5022 Chronic systolic (congestive) heart failure: Secondary | ICD-10-CM | POA: Diagnosis not present

## 2016-07-07 DIAGNOSIS — G309 Alzheimer's disease, unspecified: Secondary | ICD-10-CM | POA: Diagnosis not present

## 2016-07-07 DIAGNOSIS — I4891 Unspecified atrial fibrillation: Secondary | ICD-10-CM | POA: Diagnosis not present

## 2016-07-07 NOTE — Patient Outreach (Signed)
Transition of care call successful, ongoing follow up on recent hospitalization 9/25-9/26 leg swelling, sob, CHF.  Spoke with spouse Guy Franco (on consent form), HIPAA verified on pt.   Spouse reports pt was well until yesterday, talking out of his head more, c/o having to urinate too much/take fluid pill.   Spouse reports pt has been taking his fluid pill daily, afraid of going into nursing home.  Spouse reports pt is just getting up now, plan to weigh him before breakfast.  Spouse reports [place] RN weighed him yesterday, result 156 lbs, swelling in leg down, gets sob. Spouse reports she puts oxygen on pt at night but he  takes it off during the night due to his mind.  Spouse reports pt is weak, now have to help him out of the chair, pt using his cane.  Spouse reports HH PT has not started yet, scheduled to  come 1-2 times a week.  RN CM discussed with spouse pt f/u with Primary Care MD to which reports MD  out of town, to come back 10/6- will call then.     Plan: As discussed with spouse, plan to f/u with pt again next week- home visit.     [name].   [facility]  [phone]

## 2016-07-12 ENCOUNTER — Other Ambulatory Visit: Payer: Self-pay | Admitting: *Deleted

## 2016-07-12 DIAGNOSIS — I5022 Chronic systolic (congestive) heart failure: Secondary | ICD-10-CM | POA: Diagnosis not present

## 2016-07-12 DIAGNOSIS — I110 Hypertensive heart disease with heart failure: Secondary | ICD-10-CM | POA: Diagnosis not present

## 2016-07-12 DIAGNOSIS — F0280 Dementia in other diseases classified elsewhere without behavioral disturbance: Secondary | ICD-10-CM | POA: Diagnosis not present

## 2016-07-12 DIAGNOSIS — D61818 Other pancytopenia: Secondary | ICD-10-CM | POA: Diagnosis not present

## 2016-07-12 DIAGNOSIS — I4891 Unspecified atrial fibrillation: Secondary | ICD-10-CM | POA: Diagnosis not present

## 2016-07-12 DIAGNOSIS — G309 Alzheimer's disease, unspecified: Secondary | ICD-10-CM | POA: Diagnosis not present

## 2016-07-13 ENCOUNTER — Encounter: Payer: Self-pay | Admitting: *Deleted

## 2016-07-13 DIAGNOSIS — D61818 Other pancytopenia: Secondary | ICD-10-CM | POA: Diagnosis not present

## 2016-07-13 DIAGNOSIS — I4891 Unspecified atrial fibrillation: Secondary | ICD-10-CM | POA: Diagnosis not present

## 2016-07-13 DIAGNOSIS — G309 Alzheimer's disease, unspecified: Secondary | ICD-10-CM | POA: Diagnosis not present

## 2016-07-13 DIAGNOSIS — I5022 Chronic systolic (congestive) heart failure: Secondary | ICD-10-CM | POA: Diagnosis not present

## 2016-07-13 DIAGNOSIS — F0280 Dementia in other diseases classified elsewhere without behavioral disturbance: Secondary | ICD-10-CM | POA: Diagnosis not present

## 2016-07-13 DIAGNOSIS — I110 Hypertensive heart disease with heart failure: Secondary | ICD-10-CM | POA: Diagnosis not present

## 2016-07-13 NOTE — Patient Outreach (Signed)
[name]) Care Management   07/12/16 home visit   [name] 1925/12/22 [id number]  [name] is an 82 y.o. male  Subjective: Spouse reports pt has been compliant with taking Lasix, feet swollen this am. Spouse reports she  Has not been weighing pt daily, last weight 156 lbs, HH RN will weigh pt.  Pt reports no chest pain to which spouse  Reports pt did have chest pain- coming from anxiety, took Buspar- helped.   Spouse reports pt will apply oxygen at  Night, during night pulls off.   Spouse reports pt finished antibiotic for UTI.  Pt reports no burning when urinating, Now, gets up 3-4 times during night.  Spouse reports HH PT has not started yet.   Spouse reports  Pt has not f/u  With Primary Care MD since discharge.   Objective:   Vitals:   07/12/16 1025  BP: 100/66  Pulse: 64  Resp: 20    ROS  Physical Exam  Constitutional: He is oriented to person, place, and time. He appears well-developed and well-nourished.  Cardiovascular: Normal rate and regular rhythm.   Respiratory: Effort normal and breath sounds normal.  GI: Soft.  Musculoskeletal: He exhibits edema.  Uses cane when ambulating, unable to obtain weight- unsteady on scale  +1 edema RLE, +2 edema top of both feet/ankles.   Neurological: He is alert and oriented to person, place, and time.  Skin: Skin is warm and dry.  Psychiatric: He has a normal mood and affect. His behavior is normal. Judgment and thought content normal.    Encounter Medications:   Outpatient Encounter Prescriptions as of 07/12/2016  Medication Sig Note  . aspirin EC 81 MG EC tablet Take 1 tablet (81 mg total) by mouth daily. 06/30/2016: Spouse still needs to get.   . busPIRone (BUSPAR) 5 MG tablet TAKE 1 TABLET BY MOUTH EVERY DAY AS NEEDED FOR ANXIETY 07/12/2016: Per spouse, pt takes daily   . Carboxymethylcellul-Glycerin 0.5-0.9 % SOLN Apply 1 drop to eye daily. One drop both eyes    . diclofenac sodium (VOLTAREN) 1 % GEL  Apply 2 g topically daily as needed (pain). Reported on 01/05/2016 03/18/2016: As needed.   . dorzolamide (TRUSOPT) 2 % ophthalmic solution Place 1 drop into both eyes daily. One drop both eyes    . ENSURE (ENSURE) Take 237 mLs by mouth daily. Pt takes daily  07/12/2016: 1-2 a day   . fentaNYL (DURAGESIC - DOSED MCG/HR) 25 MCG/HR patch Place 1 patch (25 mcg total) onto the skin every 3 (three) days.   . furosemide (LASIX) 20 MG tablet Take 1 tablet (20 mg total) by mouth daily. May take an additional 20 mg in the afternoon AS NEEDED   . latanoprost (XALATAN) 0.005 % ophthalmic solution Place 1 drop into both eyes at bedtime. Both eyes    . Meth-Hyo-M Bl-Na Phos-Ph Sal (URIBEL) 118 MG CAPS Take 1 capsule (118 mg total) by mouth 2 (two) times daily as needed (bladder). 06/30/2016: Per spouse, pt taking once a day, twice as needed.   . Multiple Vitamins-Minerals (CENTRUM SILVER 50+MEN) TABS Take 1 tablet by mouth daily. Daily    . polyethylene glycol powder (GLYCOLAX/MIRALAX) powder Take 17 g by mouth daily. (Patient taking differently: Take 17 g by mouth daily as needed for mild constipation. ) 07/12/2016: As needed.   . potassium chloride (K-DUR,KLOR-CON) 10 MEQ tablet Take 1 tablet (10 mEq total) by mouth daily.   Marland Kitchen acetaminophen (TYLENOL) 650 MG CR  tablet Take 650 mg by mouth every 8 (eight) hours as needed for pain.  06/30/2016: Spouse to get for pt.   . ibuprofen (ADVIL,MOTRIN) 200 MG tablet Take 400 mg by mouth every 6 (six) hours as needed for moderate pain. 05/12/2016: As needed.    No facility-administered encounter medications on file as of 07/12/2016.     Functional Status:   In your present state of health, do you have any difficulty performing the following activities: 06/28/2016 03/15/2016  Hearing? Y N  Vision? N N  Difficulty concentrating or making decisions? N Y  Walking or climbing stairs? Y Y  Dressing or bathing? Y N  Doing errands, shopping? Y N  Using the Toilet? - -  In the past  six months, have you accidently leaked urine? - -  Do you have problems with loss of bowel control? - -  Managing your Medications? - -  Managing your Finances? - -  Housekeeping or managing your Housekeeping? - -  Some recent data might be hidden    Fall/Depression Screening:    PHQ 2/9 Scores 01/05/2016 11/25/2015 04/11/2015 01/14/2015 01/14/2015 04/25/2013  PHQ - 2 Score 1 1 0 0 1 0    Assessment:  Pleasant 82 year old gentleman, spouse main caregiver- present during home visit.                            HF- lungs clear, c/o sob upon exertion.  Attempted to weigh pt during home visit,                              Unable to obtain accurate weight after several attempts- pt not steady on scale,                             Needed use of cane to ambulate.                               +1 edema Right LE,+2 edema top of both feet/ankles. Pt has both legs elevated on                              Stool.    Plan:  Plan to continue to follow pt for transition of care, follow up again next week telephonically.             Plan to send Dr. Moshe Cipro by in basket quarterly update letter as well as 10/9 home visit                Encounter.    [facility] CM Care Plan Problem Two   Flowsheet Row Most Recent Value  Care Plan Problem Two  Risk for readmission related to recent hospitalization leg swelling,sob, CHF   Role Documenting the Problem Two  Care Management Coordinator  Care Plan for Problem Two  Active  Interventions for Problem Two Long Term Goal   Home visit done today  [facility] Long Term Goal (31-90) days  Pt would not readmit within the next 31 days   [facility] Long Term Goal Start Date  06/30/16  [facility] CM Short Term Goal #1 (0-30 days)  Pt would be compliant with Lasix as ordered for the  next 30 days   [facility]  CM Short Term Goal #1 Start Date  06/30/16  Interventions for Short Term Goal #2   Reviewed with spouse pt's compliance with Lasix, currently taking as ordered.   [facility] CM Short Term Goal #2 (0-30 days)  Pt  would weigh daily,record for the next 30 days   [facility] CM Short Term Goal #2 Start Date  06/30/16     [name].   [place] [facility]  [phone]

## 2016-07-14 ENCOUNTER — Ambulatory Visit: Payer: Self-pay | Admitting: Orthopaedic Surgery

## 2016-07-15 DIAGNOSIS — D61818 Other pancytopenia: Secondary | ICD-10-CM | POA: Diagnosis not present

## 2016-07-15 DIAGNOSIS — I5022 Chronic systolic (congestive) heart failure: Secondary | ICD-10-CM | POA: Diagnosis not present

## 2016-07-15 DIAGNOSIS — G309 Alzheimer's disease, unspecified: Secondary | ICD-10-CM | POA: Diagnosis not present

## 2016-07-15 DIAGNOSIS — F0280 Dementia in other diseases classified elsewhere without behavioral disturbance: Secondary | ICD-10-CM | POA: Diagnosis not present

## 2016-07-15 DIAGNOSIS — I110 Hypertensive heart disease with heart failure: Secondary | ICD-10-CM | POA: Diagnosis not present

## 2016-07-15 DIAGNOSIS — I4891 Unspecified atrial fibrillation: Secondary | ICD-10-CM | POA: Diagnosis not present

## 2016-07-16 DIAGNOSIS — I110 Hypertensive heart disease with heart failure: Secondary | ICD-10-CM | POA: Diagnosis not present

## 2016-07-16 DIAGNOSIS — G309 Alzheimer's disease, unspecified: Secondary | ICD-10-CM | POA: Diagnosis not present

## 2016-07-16 DIAGNOSIS — I5022 Chronic systolic (congestive) heart failure: Secondary | ICD-10-CM | POA: Diagnosis not present

## 2016-07-16 DIAGNOSIS — D61818 Other pancytopenia: Secondary | ICD-10-CM | POA: Diagnosis not present

## 2016-07-16 DIAGNOSIS — I4891 Unspecified atrial fibrillation: Secondary | ICD-10-CM | POA: Diagnosis not present

## 2016-07-16 DIAGNOSIS — F0280 Dementia in other diseases classified elsewhere without behavioral disturbance: Secondary | ICD-10-CM | POA: Diagnosis not present

## 2016-07-19 ENCOUNTER — Encounter: Payer: Self-pay | Admitting: Internal Medicine

## 2016-07-20 DIAGNOSIS — G309 Alzheimer's disease, unspecified: Secondary | ICD-10-CM | POA: Diagnosis not present

## 2016-07-20 DIAGNOSIS — I5022 Chronic systolic (congestive) heart failure: Secondary | ICD-10-CM | POA: Diagnosis not present

## 2016-07-20 DIAGNOSIS — I4891 Unspecified atrial fibrillation: Secondary | ICD-10-CM | POA: Diagnosis not present

## 2016-07-20 DIAGNOSIS — D61818 Other pancytopenia: Secondary | ICD-10-CM | POA: Diagnosis not present

## 2016-07-20 DIAGNOSIS — F0280 Dementia in other diseases classified elsewhere without behavioral disturbance: Secondary | ICD-10-CM | POA: Diagnosis not present

## 2016-07-20 DIAGNOSIS — I110 Hypertensive heart disease with heart failure: Secondary | ICD-10-CM | POA: Diagnosis not present

## 2016-07-21 ENCOUNTER — Ambulatory Visit (INDEPENDENT_AMBULATORY_CARE_PROVIDER_SITE_OTHER): Payer: Medicare Other | Admitting: Family Medicine

## 2016-07-21 ENCOUNTER — Ambulatory Visit: Payer: Self-pay | Admitting: *Deleted

## 2016-07-21 ENCOUNTER — Other Ambulatory Visit: Payer: Self-pay | Admitting: *Deleted

## 2016-07-21 ENCOUNTER — Encounter: Payer: Self-pay | Admitting: Family Medicine

## 2016-07-21 VITALS — BP 118/84 | HR 80 | Temp 97.6°F | Resp 24 | Ht 68.0 in | Wt 161.0 lb

## 2016-07-21 DIAGNOSIS — G301 Alzheimer's disease with late onset: Secondary | ICD-10-CM

## 2016-07-21 DIAGNOSIS — R296 Repeated falls: Secondary | ICD-10-CM

## 2016-07-21 DIAGNOSIS — I110 Hypertensive heart disease with heart failure: Secondary | ICD-10-CM | POA: Diagnosis not present

## 2016-07-21 DIAGNOSIS — I255 Ischemic cardiomyopathy: Secondary | ICD-10-CM

## 2016-07-21 DIAGNOSIS — Z23 Encounter for immunization: Secondary | ICD-10-CM

## 2016-07-21 DIAGNOSIS — G309 Alzheimer's disease, unspecified: Secondary | ICD-10-CM | POA: Diagnosis not present

## 2016-07-21 DIAGNOSIS — F0280 Dementia in other diseases classified elsewhere without behavioral disturbance: Secondary | ICD-10-CM | POA: Diagnosis not present

## 2016-07-21 DIAGNOSIS — D61818 Other pancytopenia: Secondary | ICD-10-CM | POA: Diagnosis not present

## 2016-07-21 DIAGNOSIS — I5023 Acute on chronic systolic (congestive) heart failure: Secondary | ICD-10-CM | POA: Diagnosis not present

## 2016-07-21 DIAGNOSIS — I4891 Unspecified atrial fibrillation: Secondary | ICD-10-CM | POA: Diagnosis not present

## 2016-07-21 DIAGNOSIS — I5022 Chronic systolic (congestive) heart failure: Secondary | ICD-10-CM | POA: Diagnosis not present

## 2016-07-21 LAB — BASIC METABOLIC PANEL
BUN: 28 mg/dL — ABNORMAL HIGH (ref 7–25)
CHLORIDE: 108 mmol/L (ref 98–110)
CO2: 27 mmol/L (ref 20–31)
Calcium: 8.7 mg/dL (ref 8.6–10.3)
Creat: 1.12 mg/dL — ABNORMAL HIGH (ref 0.70–1.11)
Glucose, Bld: 96 mg/dL (ref 65–99)
POTASSIUM: 4 mmol/L (ref 3.5–5.3)
SODIUM: 143 mmol/L (ref 135–146)

## 2016-07-21 NOTE — Patient Outreach (Signed)
Attempt made to contact pt/spouse as part of ongoing transition of care, follow up on recent hospitalization 9/25-9/26 leg swelling, sob, CHF.   Unable to leave a voice message as phone kept ringing.   View in Epic shows pt f/u with Dr. Meda Coffee today for acute visit - tremors.     Plan:  RN CM to follow up again tomorrow telephonically, part of ongoing transition of care.   [name].   [facility]  [phone]

## 2016-07-21 NOTE — Patient Instructions (Signed)
Continue all current medicine  I am getting lab tests I will send you a letter with your test results.  If there is anything of concern, we will call right away.  See Dr Moshe Cipro as scheduled

## 2016-07-21 NOTE — Progress Notes (Signed)
Chief Complaint  [name] presents with  . Chills   [name] of Dr Lynnea Maizes.  Here for acute visit.  Wife called and requested he be seen for "tremors".  She describes a spell that lasted much of Sunday where his lower jaw/chin shook and quivered.  Did not alarm [name].  Did not keep him from speaking or eating normally.  It stopped spontaneously. Fox's only complaint is that he feels cold all the time and cant get warm. He had an overnight stay at the hospital 3 weeks ago for worsening of heart failure requiring diuresis.  He has been compliant with daily lasix.  They are not able to weigh him daily due to gait instability and cannot stand on scale.  Home health has visited and a home Pt. He has no current complaint of chest pain or pressure, DOE or edema. He complains he is taking too many medicines.   I explained why all of them are needed. He is eating and sleeping well.  No problems with bowels or digestion. Wife says his memory is getting worse.  No behavior problems or wandering.  At times  Is frustrated/irritable.  [name] Active Problem List   Diagnosis Date Noted  . CHF (congestive heart failure) ([place]) 06/28/2016  . Generalized anxiety disorder 04/05/2016  . Pigmented skin lesion 04/05/2016  . Acute CHF (congestive heart failure) ([place]) 03/15/2016  . Tricuspid regurgitation 03/15/2016  . Pulmonary hypertension 10/01/2015  . Fatigue 10/01/2015  . Chronic back pain 10/01/2015  . Myelodysplasia 06/20/2015  . Dyspnea 02/09/2015  . Recurrent falls 08/01/2014  . Atrial fibrillation, persistent ([place]) 02/22/2014  . Bilateral leg weakness 05/01/2013  . Abnormal gait 06/12/2012  . GERD (gastroesophageal reflux disease) 05/09/2012  . Alzheimer's dementia 11/17/2011  . GEN OSTEOARTHROSIS INVOLVING MULTIPLE SITES 11/25/2009  . Central hearing loss 11/10/2009  . Biventricular cardiac pacemaker in situ 07/22/2009  . INTERSTITIAL CYSTITIS 07/14/2009  . Pancytopenia ([place]) 12/31/2008  .  MITRAL REGURGITATION, MODERATE 12/31/2008  . Chronic systolic heart failure ([place]) 12/31/2008  . ERECTILE DYSFUNCTION 01/31/2008  . Glaucoma 01/31/2008  . Essential hypertension 01/31/2008    Outpatient Encounter Prescriptions as of 07/21/2016  Medication Sig  . acetaminophen (TYLENOL) 650 MG CR tablet Take 650 mg by mouth every 8 (eight) hours as needed for pain.   Marland Kitchen aspirin EC 81 MG EC tablet Take 1 tablet (81 mg total) by mouth daily.  . busPIRone (BUSPAR) 5 MG tablet TAKE 1 TABLET BY MOUTH EVERY DAY AS NEEDED FOR ANXIETY  . Carboxymethylcellul-Glycerin 0.5-0.9 % SOLN Apply 1 drop to eye daily. One drop both eyes   . diclofenac sodium (VOLTAREN) 1 % GEL Apply 2 g topically daily as needed (pain). Reported on 01/05/2016  . dorzolamide (TRUSOPT) 2 % ophthalmic solution Place 1 drop into both eyes daily. One drop both eyes   . ENSURE (ENSURE) Take 237 mLs by mouth daily. Pt takes daily   . fentaNYL (DURAGESIC - DOSED MCG/HR) 25 MCG/HR patch Place 1 patch (25 mcg total) onto the skin every 3 (three) days.  . furosemide (LASIX) 20 MG tablet Take 1 tablet (20 mg total) by mouth daily. May take an additional 20 mg in the afternoon AS NEEDED  . ibuprofen (ADVIL,MOTRIN) 200 MG tablet Take 400 mg by mouth every 6 (six) hours as needed for moderate pain.  Marland Kitchen latanoprost (XALATAN) 0.005 % ophthalmic solution Place 1 drop into both eyes at bedtime. Both eyes   . Meth-Hyo-M Bl-Na Phos-Ph Sal (URIBEL) 118 MG  CAPS Take 1 capsule (118 mg total) by mouth 2 (two) times daily as needed (bladder).  . Multiple Vitamins-Minerals (CENTRUM SILVER 50+MEN) TABS Take 1 tablet by mouth daily. Daily   . polyethylene glycol powder (GLYCOLAX/MIRALAX) powder Take 17 g by mouth daily. ([name] taking differently: Take 17 g by mouth daily as needed for mild constipation. )  . potassium chloride (K-DUR,KLOR-CON) 10 MEQ tablet Take 1 tablet (10 mEq total) by mouth daily.   No facility-administered encounter medications on file  as of 07/21/2016.     Review of Systems  Constitutional: Positive for fatigue. Negative for activity change, appetite change and unexpected weight change.       Chronic.    HENT: Negative for dental problem and trouble swallowing.   Eyes: Negative for visual disturbance.  Respiratory: Negative for cough and shortness of breath.   Cardiovascular: Negative for chest pain, palpitations and leg swelling.  Gastrointestinal: Negative for constipation and diarrhea.  Genitourinary: Positive for frequency. Negative for difficulty urinating.  Musculoskeletal: Positive for back pain.       Chronic.  Controlled with fentanyl  Neurological: Negative for speech difficulty.       Gait instability  Psychiatric/Behavioral: Negative for dysphoric mood and sleep disturbance. The [name] is nervous/anxious.     BP 118/84 (BP Location: Left Arm, [name] Position: Sitting, Cuff Size: Normal)   Pulse 80   Temp 97.6 F (36.4 C) (Oral)   Resp (!) 24   Ht 5\' 8"  (1.727 m)   Wt 161 lb 0.6 oz (73 kg)   SpO2 93%   BMI 24.49 kg/m   Physical Exam  Constitutional: He appears well-developed and well-nourished. No distress.  Falls asleep during visit  HENT:  Head: Normocephalic and atraumatic.  Mouth/Throat: Oropharynx is clear and moist.  Eyes: Pupils are equal, round, and reactive to light.  Asymmetry, L eye droops  Neck: Normal range of motion. No JVD present.  Cardiovascular: Normal rate and regular rhythm.   Murmur heard. Pulmonary/Chest: Effort normal and breath sounds normal.  Abdominal: Soft. Bowel sounds are normal.  Musculoskeletal: He exhibits edema.  Trace edema  Lymphadenopathy:    He has no cervical adenopathy.  Neurological: He is alert.  Psychiatric:  Memory impaired.  Wife answers.  Repeats self.  somnolent   ASSESSMENT/PLAN:  1. Need for prophylactic vaccination and inoculation against influenza Flu Vaccine QUAD 36+ mos IM  2. Acute on chronic systolic congestive heart failure  [facility]) Hospital follow up.  stable - Basic Metabolic Panel (BMET) - Urinalysis, Routine w reflex microscopic  3. Recurrent falls Discussed walker rather than cane for increased stability 4. Late onset Alzheimer's disease without behavioral disturbance   25 min spent reassuring [name], reviewing records, discussing with Dr Moshe Cipro, discussing home care and instructions  [name] Instructions  Continue all current medicine  I am getting lab tests I will send you a letter with your test results.  If there is anything of concern, we will call right away.  See Dr Moshe Cipro as scheduled   Raylene Everts, MD

## 2016-07-22 ENCOUNTER — Encounter: Payer: Self-pay | Admitting: Family Medicine

## 2016-07-22 ENCOUNTER — Telehealth: Payer: Self-pay | Admitting: Family Medicine

## 2016-07-22 ENCOUNTER — Other Ambulatory Visit: Payer: Self-pay | Admitting: *Deleted

## 2016-07-22 LAB — URINALYSIS, ROUTINE W REFLEX MICROSCOPIC
Bilirubin Urine: NEGATIVE
Glucose, UA: NEGATIVE
Hgb urine dipstick: NEGATIVE
Ketones, ur: NEGATIVE
Leukocytes, UA: NEGATIVE
NITRITE: NEGATIVE
PH: 6.5 (ref 5.0–8.0)
Protein, ur: NEGATIVE
SPECIFIC GRAVITY, URINE: 1.018 (ref 1.001–1.035)

## 2016-07-22 NOTE — Patient Outreach (Signed)
Transition of care call successful, ongoing follow up on recent hospitalization  9/25-9/26 leg swelling, sob, CHF.  Spoke with pt's spouse [name] (on consent form) reports pt saw Primary Care MD yesterday, nurse called this am- results of urine/lab work good.  Spouse reports on pt's tremors (bottom lip) that lasted for a day, after 10/16- gone, told could be coming from anxiety.  Spouse reports pt did not take his Lasix yesterday- saw MD, forgot to give it to pt  at night and today stomach/both feet/ankles (right more than left) a little swollen.  Spouse reports has not been weighing pt, yesterday at MD office visit weight was 161 lbs, was 156 lbs.   Spouse reports HH PT started with pt, balance better, HH RN came twice this week.   RN CM discussed with spouse to have  [facility] RN  weigh pt.     Plan:  As discussed with pt's spouse, plan to follow up again next week telephonically as part of ongoing transition of care.     [name].   [facility]  [phone]

## 2016-07-22 NOTE — Telephone Encounter (Signed)
Spoke to pts wife, aware of lab results and to enc more fluids.

## 2016-07-22 NOTE — Telephone Encounter (Signed)
-----   Message from Raylene Everts, MD sent at 07/22/2016  7:58 AM EDT ----- Please call Mrs Hanratty and let her know the lab test and urine test are in and are good.  Make sure he is drinking enough water.

## 2016-07-23 DIAGNOSIS — I110 Hypertensive heart disease with heart failure: Secondary | ICD-10-CM | POA: Diagnosis not present

## 2016-07-23 DIAGNOSIS — I5022 Chronic systolic (congestive) heart failure: Secondary | ICD-10-CM | POA: Diagnosis not present

## 2016-07-23 DIAGNOSIS — G309 Alzheimer's disease, unspecified: Secondary | ICD-10-CM | POA: Diagnosis not present

## 2016-07-23 DIAGNOSIS — I4891 Unspecified atrial fibrillation: Secondary | ICD-10-CM | POA: Diagnosis not present

## 2016-07-23 DIAGNOSIS — D61818 Other pancytopenia: Secondary | ICD-10-CM | POA: Diagnosis not present

## 2016-07-23 DIAGNOSIS — F0280 Dementia in other diseases classified elsewhere without behavioral disturbance: Secondary | ICD-10-CM | POA: Diagnosis not present

## 2016-07-26 ENCOUNTER — Other Ambulatory Visit (HOSPITAL_COMMUNITY): Payer: Self-pay | Admitting: Oncology

## 2016-07-26 DIAGNOSIS — E876 Hypokalemia: Secondary | ICD-10-CM

## 2016-07-27 ENCOUNTER — Other Ambulatory Visit: Payer: Self-pay

## 2016-07-27 DIAGNOSIS — F411 Generalized anxiety disorder: Secondary | ICD-10-CM

## 2016-07-27 MED ORDER — BUSPIRONE HCL 5 MG PO TABS: ORAL_TABLET | ORAL | 1 refills | Status: AC

## 2016-07-28 DIAGNOSIS — G309 Alzheimer's disease, unspecified: Secondary | ICD-10-CM | POA: Diagnosis not present

## 2016-07-28 DIAGNOSIS — I4891 Unspecified atrial fibrillation: Secondary | ICD-10-CM | POA: Diagnosis not present

## 2016-07-28 DIAGNOSIS — D61818 Other pancytopenia: Secondary | ICD-10-CM | POA: Diagnosis not present

## 2016-07-28 DIAGNOSIS — F0280 Dementia in other diseases classified elsewhere without behavioral disturbance: Secondary | ICD-10-CM | POA: Diagnosis not present

## 2016-07-28 DIAGNOSIS — I110 Hypertensive heart disease with heart failure: Secondary | ICD-10-CM | POA: Diagnosis not present

## 2016-07-28 DIAGNOSIS — I5022 Chronic systolic (congestive) heart failure: Secondary | ICD-10-CM | POA: Diagnosis not present

## 2016-07-29 DIAGNOSIS — I4891 Unspecified atrial fibrillation: Secondary | ICD-10-CM | POA: Diagnosis not present

## 2016-07-29 DIAGNOSIS — I5022 Chronic systolic (congestive) heart failure: Secondary | ICD-10-CM | POA: Diagnosis not present

## 2016-07-29 DIAGNOSIS — D61818 Other pancytopenia: Secondary | ICD-10-CM | POA: Diagnosis not present

## 2016-07-29 DIAGNOSIS — F0280 Dementia in other diseases classified elsewhere without behavioral disturbance: Secondary | ICD-10-CM | POA: Diagnosis not present

## 2016-07-29 DIAGNOSIS — I110 Hypertensive heart disease with heart failure: Secondary | ICD-10-CM | POA: Diagnosis not present

## 2016-07-29 DIAGNOSIS — G309 Alzheimer's disease, unspecified: Secondary | ICD-10-CM | POA: Diagnosis not present

## 2016-07-30 ENCOUNTER — Telehealth: Payer: Self-pay

## 2016-07-30 ENCOUNTER — Other Ambulatory Visit: Payer: Self-pay | Admitting: *Deleted

## 2016-07-30 NOTE — Patient Outreach (Signed)
Transition of care call successful, following up on recent hospitalization 9/25-9/26 leg swelling, sob, CHF.  Spoke with spouse Guy Franco (on consent form), reports pt not doing good, up 2 hours last night (trying to dial phone), confusion getting worse. Spouse reports she was told not to let pt sleep so much.  Spouse reports she made up her mind, going to get help with caring for pt.  Spouse reports she plans to call her  2 friends, one of them worked for a home health agency as their  services are cheaper plus both she and pt know them.  Spouse reports HH RN and HH PT came yesterday, [facility] RN weighed pt, wrote it down but did not let her know the results.  Spouse reports HH RN has been coming twice a week.  Spouse reports pt's feet are still swollen, plan to give extra Lasix today.    Plan: As discussed with pt's spouse, plan to follow up again next week for  final transition of care call but will continue to provide pt with ongoing community nurse case management services.     [name].   [facility]  [phone]

## 2016-07-30 NOTE — Telephone Encounter (Signed)
FYI

## 2016-08-02 ENCOUNTER — Other Ambulatory Visit: Payer: Self-pay | Admitting: *Deleted

## 2016-08-02 ENCOUNTER — Ambulatory Visit: Payer: Self-pay | Admitting: *Deleted

## 2016-08-02 DIAGNOSIS — I5022 Chronic systolic (congestive) heart failure: Secondary | ICD-10-CM | POA: Diagnosis not present

## 2016-08-02 DIAGNOSIS — D61818 Other pancytopenia: Secondary | ICD-10-CM | POA: Diagnosis not present

## 2016-08-02 DIAGNOSIS — I110 Hypertensive heart disease with heart failure: Secondary | ICD-10-CM | POA: Diagnosis not present

## 2016-08-02 DIAGNOSIS — G309 Alzheimer's disease, unspecified: Secondary | ICD-10-CM | POA: Diagnosis not present

## 2016-08-02 DIAGNOSIS — I4891 Unspecified atrial fibrillation: Secondary | ICD-10-CM | POA: Diagnosis not present

## 2016-08-02 DIAGNOSIS — F0280 Dementia in other diseases classified elsewhere without behavioral disturbance: Secondary | ICD-10-CM | POA: Diagnosis not present

## 2016-08-02 NOTE — Patient Outreach (Signed)
Attempt made to contact pt/spouse for final transition of care call.   Person answering the phone reports both pt and spouse are not available at this time, identified herself as pt's caregiver.   Plan:  RN CM to follow up later today- make another attempt with  final transition of care call.    [name].   [facility]  [phone]

## 2016-08-02 NOTE — Patient Outreach (Signed)
Final transition of care call successful, ongoing follow up on recent hospitalization 9/25-9/26 leg swelling, sob, CHF.   Spoke with spouse [name] (on consent form), reports pt not doing good, HH PT was here today while she at a funeral, sitter relayed [facility] PT said pt needs Hospice.   Spouse reports pt does not want to do the exercises, getting weaker. Spouse reports pt is to see Heart MD 11/2 to which RN CM suggested she talk to MD about Hospice services for pt.  Spouse reports on recent fall pt had last week- happened at night, pillow slipped, pt fell out of bed- no injury.  Spouse reports pt has been having swelling in stomach past couple days, swelling also in right leg.  Spouse reports she is giving pt extra Lasix as needed.   RN CM discussed with spouse today is final transition of care call, plan to continue to follow pt with community nurse case management services.   Plan:  As discussed with spouse, plan to continue to follow pt with community nurse case management services, follow up again next month- home visit.            As discussed, spouse to talk to Heart MD at pt's upcoming office visit about Hospice services.    [name].   [facility]  [phone]

## 2016-08-04 DIAGNOSIS — I110 Hypertensive heart disease with heart failure: Secondary | ICD-10-CM | POA: Diagnosis not present

## 2016-08-04 DIAGNOSIS — F0280 Dementia in other diseases classified elsewhere without behavioral disturbance: Secondary | ICD-10-CM | POA: Diagnosis not present

## 2016-08-04 DIAGNOSIS — I5022 Chronic systolic (congestive) heart failure: Secondary | ICD-10-CM | POA: Diagnosis not present

## 2016-08-04 DIAGNOSIS — G309 Alzheimer's disease, unspecified: Secondary | ICD-10-CM | POA: Diagnosis not present

## 2016-08-04 DIAGNOSIS — I4891 Unspecified atrial fibrillation: Secondary | ICD-10-CM | POA: Diagnosis not present

## 2016-08-04 DIAGNOSIS — D61818 Other pancytopenia: Secondary | ICD-10-CM | POA: Diagnosis not present

## 2016-08-05 ENCOUNTER — Ambulatory Visit: Payer: Self-pay | Admitting: Cardiology

## 2016-08-05 ENCOUNTER — Telehealth: Payer: Self-pay | Admitting: Family Medicine

## 2016-08-05 NOTE — Telephone Encounter (Signed)
Looking at med list , I do not see any new medications causing the problem, likely sleep disturbance is from his dementia. Needs meds I see on current list, no medication to be discontinued at this time

## 2016-08-05 NOTE — Telephone Encounter (Signed)
Guy Franco is calling asking if Dr. Moshe Cipro could advise and/or discontinue the medication that could be causing [name] to have terrible dreams at night and he is waking [place] up at all hours very disoriented and upset and confused. Please advise?

## 2016-08-06 NOTE — Telephone Encounter (Signed)
Spoke with wife and she is aware that no medications changes will be made.  Discussed the process of Dementia progression.  Urged her to speak with family members and request help as needed.  She will do this and speak with [facility] representative on Monday.

## 2016-08-09 ENCOUNTER — Other Ambulatory Visit: Payer: Self-pay | Admitting: *Deleted

## 2016-08-09 NOTE — Patient Outreach (Addendum)
[facility]) Care Management   08/09/2016  [name] 1925/11/22 [id number]  [name] is an 82 y.o. male  Subjective: Spouse reports pt has been having terrible dreams, woke up one night accusing her, was  Able to calm him down, Alzheimer getting worse.  Spouse reports she has 2 friends come in now to assist With spouse/cleaning plus one cooks. Pt reports today no complaints of sob or pain, just worn out.  Spouse  Reports pt sleeps a lot, won't go outside and walk.  Spouse reports  HH RN still coming, swelling in pt's feet as come down past couple days/reminds pt to elevate his feet. Spouse reports pt continues to take  Lasix as ordered.  Spouse reports pt saw MD last week, did not talk about Hospice services,concerned  Signing papers with Hospice could take our land/home away.    Objective:   Vitals:   08/09/16 1407  BP: 98/80  Pulse: 62  Resp: (!) 36    ROS  Physical Exam  Constitutional: He is oriented to person, place, and time. He appears well-developed and well-nourished.  Cardiovascular: Normal rate.   Slight irregular   Respiratory: Effort normal and breath sounds normal.  GI: Soft. Bowel sounds are normal.  Musculoskeletal: He exhibits edema.  +1 edema top of right foot/trace lower leg, +2 top of left foot, +1  lower leg.   Neurological: He is alert and oriented to person, place, and time.  Skin: Skin is warm and dry.  Psychiatric: His behavior is normal. Judgment and thought content normal.  Dozing off during home visit.     Encounter Medications:  Reviewed pt's medications with spouse  Outpatient Encounter Prescriptions as of 08/09/2016  Medication Sig Note  . aspirin EC 81 MG EC tablet Take 1 tablet (81 mg total) by mouth daily. 06/30/2016: Spouse still needs to get.   . busPIRone (BUSPAR) 5 MG tablet TAKE 1 TABLET BY MOUTH EVERY DAY AS NEEDED FOR ANXIETY   . Carboxymethylcellul-Glycerin 0.5-0.9 % SOLN Apply 1 drop to eye daily. One drop both  eyes    . diclofenac sodium (VOLTAREN) 1 % GEL Apply 2 g topically daily as needed (pain). Reported on 01/05/2016 08/09/2016: As needed.   . dorzolamide (TRUSOPT) 2 % ophthalmic solution Place 1 drop into both eyes daily. One drop both eyes    . ENSURE (ENSURE) Take 237 mLs by mouth daily. Pt takes daily  07/12/2016: 1-2 a day   . fentaNYL (DURAGESIC - DOSED MCG/HR) 25 MCG/HR patch Place 1 patch (25 mcg total) onto the skin every 3 (three) days.   . furosemide (LASIX) 20 MG tablet Take 1 tablet (20 mg total) by mouth daily. May take an additional 20 mg in the afternoon AS NEEDED   . KLOR-CON M20 20 MEQ tablet TAKE 1 TABLET (20 MEQ TOTAL) BY MOUTH DAILY.   Marland Kitchen latanoprost (XALATAN) 0.005 % ophthalmic solution Place 1 drop into both eyes at bedtime. Both eyes    . Meth-Hyo-M Bl-Na Phos-Ph Sal (URIBEL) 118 MG CAPS Take 1 capsule (118 mg total) by mouth 2 (two) times daily as needed (bladder). 06/30/2016: Per spouse, pt taking once a day, twice as needed.   . polyethylene glycol powder (GLYCOLAX/MIRALAX) powder Take 17 g by mouth daily. (Patient taking differently: Take 17 g by mouth daily as needed for mild constipation. ) 08/09/2016: As needed.   Marland Kitchen acetaminophen (TYLENOL) 650 MG CR tablet Take 650 mg by mouth every 8 (eight) hours as needed for  pain.  06/30/2016: Spouse to get for pt.   . ibuprofen (ADVIL,MOTRIN) 200 MG tablet Take 400 mg by mouth every 6 (six) hours as needed for moderate pain. 05/12/2016: As needed.   . Multiple Vitamins-Minerals (CENTRUM SILVER 50+MEN) TABS Take 1 tablet by mouth daily. Daily     No facility-administered encounter medications on file as of 08/09/2016.     Functional Status:   In your present state of health, do you have any difficulty performing the following activities: 06/28/2016 03/15/2016  Hearing? Y N  Vision? N N  Difficulty concentrating or making decisions? N Y  Walking or climbing stairs? Y Y  Dressing or bathing? Y N  Doing errands, shopping? Y N  Using the  Toilet? - -  In the past six months, have you accidently leaked urine? - -  Do you have problems with loss of bowel control? - -  Managing your Medications? - -  Managing your Finances? - -  Housekeeping or managing your Housekeeping? - -  Some recent data might be hidden    Fall/Depression Screening:    PHQ 2/9 Scores 07/21/2016 01/05/2016 11/25/2015 04/11/2015 01/14/2015 01/14/2015 04/25/2013  PHQ - 2 Score 0 1 1 0 0 1 0    Assessment:  82 year old gentleman, resides with spouse (main caregiver), dozing off during most of home Visit.    Lungs clear, BP 98/80- encouraged hydration with pt/spouse.               HF-  +1 edema top of right foot/trace lower leg,+2 top of left foot, +1 lower leg.  Spouse to give                      Extra Lasix today.  Last reported weight by spouse- 151 lbs (done by [facility] RN) spouse not                      Weighing pt.              Alzheimer- per spouse getting worse, pt reports  worn out.  Discussed with spouse Hospice/                  Palliative services- benefits, covered under pt's insurance to which spouse voiced                   Understanding, willing to have RN CM call MD for referral for Palliative services.                      Plan:   As discussed with spouse, RN CM to call MD request referral for Palliative services.              RN CM to follow up with spouse telephonically within 14 days, check to see if pt receiving Palliative                  Services.    [facility] CM Care Plan Problem One   Flowsheet Row Most Recent Value  Care Plan Problem One  HF program   Role Documenting the Problem One  Care Management Coordinator  Care Plan for Problem One  Active  [facility] Long Term Goal (31-90 days)  re established - Spouse would continue to see decrease in leg swelling in the next 31 days   [facility] Long Term Goal Start Date  08/09/16  Interventions for Problem One Long Term Goal  Discussed with spouse continue to have pt elevate legs, give extra Lasix as needed.    [facility] CM Short Term Goal #1 (0-30 days)  Need for hospice services would be addressed within the next 7 days   [facility] CM Short Term Goal #1 Start Date  08/02/16  [facility] CM Short Term Goal #1 Met Date  -- [not met- more information needed. ]  [facility] CM Short Term Goal #2 (0-30 days)  Hydration for pt would be would be within norm within the next 30 days   [facility] CM Short Term Goal #2 Start Date  08/09/16  Interventions for Short Term Goal #2  Reviewed with spouse with BP on the low side,importance of hydration pt taking fluid medication.   [facility]N CM Short Term Goal #3 (0-30 days)  Pt would be evaluated for Palliative care services within the next 14 days   [facility]N CM Short Term Goal #3 Start Date  08/09/16  Interventions for Short Tern Goal #3  As discussed with spouse, RN CM to call MD - request referral for Palliative care services.      [name].   [place] [facility]  [phone]

## 2016-08-11 DIAGNOSIS — G309 Alzheimer's disease, unspecified: Secondary | ICD-10-CM | POA: Diagnosis not present

## 2016-08-11 DIAGNOSIS — I110 Hypertensive heart disease with heart failure: Secondary | ICD-10-CM | POA: Diagnosis not present

## 2016-08-11 DIAGNOSIS — I4891 Unspecified atrial fibrillation: Secondary | ICD-10-CM | POA: Diagnosis not present

## 2016-08-11 DIAGNOSIS — D61818 Other pancytopenia: Secondary | ICD-10-CM | POA: Diagnosis not present

## 2016-08-11 DIAGNOSIS — F0280 Dementia in other diseases classified elsewhere without behavioral disturbance: Secondary | ICD-10-CM | POA: Diagnosis not present

## 2016-08-11 DIAGNOSIS — I5022 Chronic systolic (congestive) heart failure: Secondary | ICD-10-CM | POA: Diagnosis not present

## 2016-08-12 DIAGNOSIS — I5022 Chronic systolic (congestive) heart failure: Secondary | ICD-10-CM | POA: Diagnosis not present

## 2016-08-12 DIAGNOSIS — F0280 Dementia in other diseases classified elsewhere without behavioral disturbance: Secondary | ICD-10-CM | POA: Diagnosis not present

## 2016-08-12 DIAGNOSIS — I110 Hypertensive heart disease with heart failure: Secondary | ICD-10-CM | POA: Diagnosis not present

## 2016-08-12 DIAGNOSIS — D61818 Other pancytopenia: Secondary | ICD-10-CM | POA: Diagnosis not present

## 2016-08-12 DIAGNOSIS — I4891 Unspecified atrial fibrillation: Secondary | ICD-10-CM | POA: Diagnosis not present

## 2016-08-12 DIAGNOSIS — G309 Alzheimer's disease, unspecified: Secondary | ICD-10-CM | POA: Diagnosis not present

## 2016-08-16 DIAGNOSIS — I110 Hypertensive heart disease with heart failure: Secondary | ICD-10-CM | POA: Diagnosis not present

## 2016-08-16 DIAGNOSIS — G309 Alzheimer's disease, unspecified: Secondary | ICD-10-CM | POA: Diagnosis not present

## 2016-08-16 DIAGNOSIS — I4891 Unspecified atrial fibrillation: Secondary | ICD-10-CM | POA: Diagnosis not present

## 2016-08-16 DIAGNOSIS — F0280 Dementia in other diseases classified elsewhere without behavioral disturbance: Secondary | ICD-10-CM | POA: Diagnosis not present

## 2016-08-16 DIAGNOSIS — D61818 Other pancytopenia: Secondary | ICD-10-CM | POA: Diagnosis not present

## 2016-08-16 DIAGNOSIS — I5022 Chronic systolic (congestive) heart failure: Secondary | ICD-10-CM | POA: Diagnosis not present

## 2016-08-19 ENCOUNTER — Encounter: Payer: Self-pay | Admitting: Adult Health

## 2016-08-19 ENCOUNTER — Other Ambulatory Visit (HOSPITAL_COMMUNITY)
Admission: RE | Admit: 2016-08-19 | Discharge: 2016-08-19 | Disposition: A | Discharge status: Home / Self Care | Payer: Medicare Other | Source: Ambulatory Visit | Attending: Adult Health | Admitting: Adult Health

## 2016-08-19 ENCOUNTER — Ambulatory Visit (INDEPENDENT_AMBULATORY_CARE_PROVIDER_SITE_OTHER): Payer: Medicare Other | Admitting: Adult Health

## 2016-08-19 VITALS — BP 116/70 | HR 76 | Ht 67.0 in | Wt 163.0 lb

## 2016-08-19 DIAGNOSIS — Z95810 Presence of automatic (implantable) cardiac defibrillator: Secondary | ICD-10-CM | POA: Diagnosis not present

## 2016-08-19 DIAGNOSIS — I4891 Unspecified atrial fibrillation: Secondary | ICD-10-CM | POA: Insufficient documentation

## 2016-08-19 DIAGNOSIS — I10 Essential (primary) hypertension: Secondary | ICD-10-CM | POA: Diagnosis not present

## 2016-08-19 DIAGNOSIS — I110 Hypertensive heart disease with heart failure: Secondary | ICD-10-CM | POA: Diagnosis not present

## 2016-08-19 DIAGNOSIS — F0390 Unspecified dementia without behavioral disturbance: Secondary | ICD-10-CM | POA: Diagnosis not present

## 2016-08-19 DIAGNOSIS — I5022 Chronic systolic (congestive) heart failure: Secondary | ICD-10-CM | POA: Diagnosis not present

## 2016-08-19 DIAGNOSIS — Z9981 Dependence on supplemental oxygen: Secondary | ICD-10-CM | POA: Insufficient documentation

## 2016-08-19 DIAGNOSIS — I255 Ischemic cardiomyopathy: Secondary | ICD-10-CM

## 2016-08-19 LAB — CBC WITH DIFFERENTIAL/PLATELET
BASOS ABS: 0 10*3/uL (ref 0.0–0.1)
BASOS PCT: 0 %
Eosinophils Absolute: 0 10*3/uL (ref 0.0–0.7)
Eosinophils Relative: 1 %
HCT: 30.2 % — ABNORMAL LOW (ref 39.0–52.0)
Hemoglobin: 9.7 g/dL — ABNORMAL LOW (ref 13.0–17.0)
Lymphocytes Relative: 17 %
Lymphs Abs: 0.4 10*3/uL — ABNORMAL LOW (ref 0.7–4.0)
MCH: 32.4 pg (ref 26.0–34.0)
MCHC: 32.1 g/dL (ref 30.0–36.0)
MCV: 101 fL — ABNORMAL HIGH (ref 78.0–100.0)
MONO ABS: 0.2 10*3/uL (ref 0.1–1.0)
Monocytes Relative: 8 %
NEUTROS ABS: 2 10*3/uL (ref 1.7–7.7)
Neutrophils Relative %: 75 %
PLATELETS: 84 10*3/uL — AB (ref 150–400)
RBC: 2.99 MIL/uL — ABNORMAL LOW (ref 4.22–5.81)
RDW: 16.9 % — AB (ref 11.5–15.5)
WBC: 2.6 10*3/uL — ABNORMAL LOW (ref 4.0–10.5)

## 2016-08-19 LAB — BASIC METABOLIC PANEL
Anion gap: 6 (ref 5–15)
BUN: 24 mg/dL — ABNORMAL HIGH (ref 6–20)
CALCIUM: 8.7 mg/dL — AB (ref 8.9–10.3)
CO2: 28 mmol/L (ref 22–32)
CREATININE: 1.29 mg/dL — AB (ref 0.61–1.24)
Chloride: 107 mmol/L (ref 101–111)
GFR calc non Af Amer: 48 mL/min — ABNORMAL LOW (ref >60)
GFR, EST AFRICAN AMERICAN: 56 mL/min — AB (ref >60)
Glucose, Bld: 98 mg/dL (ref 65–99)
Potassium: 3.9 mmol/L (ref 3.5–5.1)
SODIUM: 141 mmol/L (ref 135–145)

## 2016-08-19 LAB — BRAIN NATRIURETIC PEPTIDE: B NATRIURETIC PEPTIDE 5: 1250 pg/mL — AB (ref 0.0–100.0)

## 2016-08-19 NOTE — Progress Notes (Signed)
Name: [name]    DOB: 09-Oct-1925  Age: 82 y.o.  MR#: [id number]       PCP:  [name], MD      Insurance: Payor: MEDICARE / Plan: MEDICARE PART A AND B / Product Type: *No Product type* /   CC:   No chief complaint on file.   VS Vitals:   08/19/16 1437  Weight: 163 lb (73.9 kg)  Height: 5\' 7"  (1.702 m)    Weights Current Weight  08/19/16 163 lb (73.9 kg)  08/09/16 151 lb (68.5 kg)  07/21/16 161 lb 0.6 oz (73 kg)    Blood Pressure  BP Readings from Last 3 Encounters:  08/09/16 98/80  07/21/16 118/84  07/12/16 100/66     Admit date:  (Not on file) Last encounter with RMR:  07/01/2016   Allergy Codeine and Penicillins  Current Outpatient Prescriptions  Medication Sig Dispense Refill  . acetaminophen (TYLENOL) 650 MG CR tablet Take 650 mg by mouth every 8 (eight) hours as needed for pain.     Marland Kitchen aspirin EC 81 MG EC tablet Take 1 tablet (81 mg total) by mouth daily.    . busPIRone (BUSPAR) 5 MG tablet TAKE 1 TABLET BY MOUTH EVERY DAY AS NEEDED FOR ANXIETY 90 tablet 1  . Carboxymethylcellul-Glycerin 0.5-0.9 % SOLN Apply 1 drop to eye daily. One drop both eyes     . diclofenac sodium (VOLTAREN) 1 % GEL Apply 2 g topically daily as needed (pain). Reported on 01/05/2016    . dorzolamide (TRUSOPT) 2 % ophthalmic solution Place 1 drop into both eyes daily. One drop both eyes     . ENSURE (ENSURE) Take 237 mLs by mouth daily. Pt takes daily     . fentaNYL (DURAGESIC - DOSED MCG/HR) 25 MCG/HR patch Place 1 patch (25 mcg total) onto the skin every 3 (three) days. 10 patch 0  . ibuprofen (ADVIL,MOTRIN) 200 MG tablet Take 400 mg by mouth every 6 (six) hours as needed for moderate pain.    Marland Kitchen KLOR-CON M20 20 MEQ tablet TAKE 1 TABLET (20 MEQ TOTAL) BY MOUTH DAILY. 30 tablet 0  . latanoprost (XALATAN) 0.005 % ophthalmic solution Place 1 drop into both eyes at bedtime. Both eyes     . Meth-Hyo-M Bl-Na Phos-Ph Sal (URIBEL) 118 MG CAPS Take 1 capsule (118 mg total) by mouth 2 (two) times  daily as needed (bladder). 30 capsule 0  . Multiple Vitamins-Minerals (CENTRUM SILVER 50+MEN) TABS Take 1 tablet by mouth daily. Daily     . polyethylene glycol powder (GLYCOLAX/MIRALAX) powder Take 17 g by mouth daily. (Patient taking differently: Take 17 g by mouth daily as needed for mild constipation. ) 3350 g 1  . furosemide (LASIX) 20 MG tablet Take 1 tablet (20 mg total) by mouth daily. May take an additional 20 mg in the afternoon AS NEEDED 90 tablet 0   No current facility-administered medications for this visit.     Discontinued Meds:   There are no discontinued medications.  Patient Active Problem List   Diagnosis Date Noted  . CHF (congestive heart failure) ([place]) 06/28/2016  . Generalized anxiety disorder 04/05/2016  . Pigmented skin lesion 04/05/2016  . Acute CHF (congestive heart failure) ([place]) 03/15/2016  . Tricuspid regurgitation 03/15/2016  . Pulmonary hypertension 10/01/2015  . Fatigue 10/01/2015  . Chronic back pain 10/01/2015  . Myelodysplasia 06/20/2015  . Dyspnea 02/09/2015  . Recurrent falls 08/01/2014  . Atrial fibrillation, persistent ([place]) 02/22/2014  . Bilateral leg  weakness 05/01/2013  . Abnormal gait 06/12/2012  . GERD (gastroesophageal reflux disease) 05/09/2012  . Alzheimer's dementia 11/17/2011  . GEN OSTEOARTHROSIS INVOLVING MULTIPLE SITES 11/25/2009  . Central hearing loss 11/10/2009  . Biventricular cardiac pacemaker in situ 07/22/2009  . INTERSTITIAL CYSTITIS 07/14/2009  . Pancytopenia ([place]) 12/31/2008  . MITRAL REGURGITATION, MODERATE 12/31/2008  . Chronic systolic heart failure ([place]) 12/31/2008  . ERECTILE DYSFUNCTION 01/31/2008  . Glaucoma 01/31/2008  . Essential hypertension 01/31/2008    LABS    Component Value Date/Time   NA 143 07/21/2016 1244   NA 139 07/01/2016 1620   NA 139 06/29/2016 0325   K 4.0 07/21/2016 1244   K 3.6 07/01/2016 1620   K 3.4 (L) 06/29/2016 0325   CL 108 07/21/2016 1244   CL 105 07/01/2016 1620   CL  110 06/29/2016 0325   CO2 27 07/21/2016 1244   CO2 29 07/01/2016 1620   CO2 29 06/29/2016 0325   GLUCOSE 96 07/21/2016 1244   GLUCOSE 92 07/01/2016 1620   GLUCOSE 106 (H) 06/29/2016 0325   BUN 28 (H) 07/21/2016 1244   BUN 24 (H) 07/01/2016 1620   BUN 25 (H) 06/29/2016 0325   CREATININE 1.12 (H) 07/21/2016 1244   CREATININE 0.98 07/01/2016 1620   CREATININE 0.94 06/29/2016 0325   CREATININE 1.07 06/28/2016 1327   CREATININE 1.08 05/10/2016 1156   CREATININE 1.01 04/05/2016 1421   CALCIUM 8.7 07/21/2016 1244   CALCIUM 8.6 (L) 07/01/2016 1620   CALCIUM 8.2 (L) 06/29/2016 0325   GFRNONAA >60 07/01/2016 1620   GFRNONAA >60 06/29/2016 0325   GFRNONAA >60 06/28/2016 1327   GFRAA >60 07/01/2016 1620   GFRAA >60 06/29/2016 0325   GFRAA >60 06/28/2016 1327   CMP     Component Value Date/Time   NA 143 07/21/2016 1244   K 4.0 07/21/2016 1244   CL 108 07/21/2016 1244   CO2 27 07/21/2016 1244   GLUCOSE 96 07/21/2016 1244   BUN 28 (H) 07/21/2016 1244   CREATININE 1.12 (H) 07/21/2016 1244   CALCIUM 8.7 07/21/2016 1244   PROT 6.5 02/14/2016 1542   ALBUMIN 3.5 02/14/2016 1542   AST 26 02/14/2016 1542   ALT 18 02/14/2016 1542   ALKPHOS 58 02/14/2016 1542   BILITOT 2.5 (H) 02/14/2016 1542   GFRNONAA >60 07/01/2016 1620   GFRAA >60 07/01/2016 1620       Component Value Date/Time   WBC 2.4 (L) 06/28/2016 1327   WBC 2.8 (L) 05/20/2016 1656   WBC 2.7 (L) 04/05/2016 1421   HGB 9.9 (L) 06/28/2016 1327   HGB 10.3 (L) 05/20/2016 1656   HGB 9.6 (L) 04/05/2016 1421   HCT 30.8 (L) 06/28/2016 1327   HCT 31.4 (L) 05/20/2016 1656   HCT 29.5 (L) 04/05/2016 1421   MCV 100.3 (H) 06/28/2016 1327   MCV 100.0 05/20/2016 1656   MCV 99.0 04/05/2016 1421    Lipid Panel     Component Value Date/Time   CHOL 179 06/19/2013 1241   TRIG 66 06/19/2013 1241   HDL 51 06/19/2013 1241   CHOLHDL 3.5 06/19/2013 1241   VLDL 13 06/19/2013 1241   LDLCALC 115 (H) 06/19/2013 1241    ABG    Component  Value Date/Time   TCO2 26 03/01/2016 1952     Lab Results  Component Value Date   TSH 4.23 05/10/2016   BNP (last 3 results)  Recent Labs  10/01/15 1653 05/10/16 1156 06/28/16 1327  BNP 583.4* 1,122.3* 723.0*  ProBNP (last 3 results) No results for input(s): PROBNP in the last 8760 hours.  Cardiac Panel (last 3 results) No results for input(s): CKTOTAL, CKMB, TROPONINI, RELINDX in the last 72 hours.  Iron/TIBC/Ferritin/ %Sat    Component Value Date/Time   IRON 78 12/29/2015 1313   TIBC 308 12/29/2015 1313   FERRITIN 60 12/29/2015 1313   IRONPCTSAT 25 12/29/2015 1313     EKG Orders placed or performed during the hospital encounter of 06/28/16  . ED EKG within 10 minutes  . ED EKG within 10 minutes  . EKG 12-Lead  . EKG 12-Lead  . EKG     Prior Assessment and Plan Problem List as of 08/19/2016 Reviewed: 08/09/2016  2:12 PM by Grandville Silos, RN     Cardiovascular and Mediastinum   MITRAL REGURGITATION, MODERATE   Last Assessment & Plan 08/08/2014 Office Visit Written 08/08/2014  4:06 PM by Lendon Colonel, NP    No apparent for repair or surgery at this time. Multiple comorbidities. Continue medical management.      Essential hypertension   Last Assessment & Plan 05/10/2016 Office Visit Written 05/16/2016  9:57 PM by Fayrene Helper, MD    Controlled, no change in medication       Chronic systolic heart failure [facility])   Last Assessment & Plan 02/20/2016 Office Visit Written 02/20/2016  3:36 PM by Fayrene Helper, MD    Increased shortness of breath  noted in pt with established heart failure and possible nocturnal wheezuing, needs overnight pulse oximetry, will refer      Atrial fibrillation, persistent [facility])   Last Assessment & Plan 10/01/2015 Office Visit Written 10/01/2015  3:45 PM by Erlene Quan, PA-C    Unable to tolerate anticoagulation secondary to GI bleeding CHADs VASc=3      Pulmonary hypertension   Last Assessment & Plan  02/20/2016 Office Visit Written 02/20/2016  3:43 PM by Fayrene Helper, MD    Shortness of breath and wheezing noted a at night increasingly by spouse, refer for overnight pulse oximetry      Acute CHF (congestive heart failure) [facility])   Last Assessment & Plan 05/10/2016 Office Visit Written 05/16/2016  9:56 PM by Fayrene Helper, MD    Acute flare of symptoms this past weekend, reviewed with pt and spouse the importance of taking medication as prescribed,  Still not consistently taking his diuretic Will check BNP today based on symptom severity, reports he "felt like I was leaving here" and wife states she also gave him a NTG May need sooner cardiology appointment, will follow through      Tricuspid regurgitation   CHF (congestive heart failure) ([place])     Digestive   GERD (gastroesophageal reflux disease)   Last Assessment & Plan 05/10/2016 Office Visit Written 05/16/2016 10:01 PM by Fayrene Helper, MD    Controlled, no change in medication         Nervous and Auditory   Central hearing loss   Last Assessment & Plan 12/28/2013 Office Visit Written 12/29/2013  9:11 AM by Fayrene Helper, MD    needs hearing aids, will advise checking belltone  For help with this      Alzheimer's dementia   Last Assessment & Plan 05/10/2016 Office Visit Written 05/16/2016  9:59 PM by Fayrene Helper, MD    Stable off of medication      Bilateral leg weakness   Last Assessment & Plan 09/09/2014 Office Visit Written 02/02/2015 10:59  AM by Fayrene Helper, MD    Improved with recent in house therapy, he is to continue out pt in home PT/OT  For next approx 4 weeks      Myelodysplasia   Last Assessment & Plan 11/03/2015 Office Visit Edited 11/03/2015  3:28 PM by Baird Cancer, PA-C    Pancytopenia in the setting of a bone marrow aspiration and biopsy on 0000000 by Dr. Sterling Big at [facility] demonstrating MDS with 22q deletion, normocellular marrow at 25% without evidence of melanoma, lymphoma, or  morphologic evidence of MDS.  However, decreased iron stores were noted.  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  In November, his ferritin was less than 100 and his calculated iron deficit was approximately 370 mg.  He was therefore given ferric gluconate 125 mg x 2.   Oncology Flowsheet 09/08/2015 09/15/2015  ferric gluconate (NULECIT) IV 125 mg 125 mg    At this time, there is no need for ESA therapy.  However, if he anemia progresses, we could absolutely consider ESA treatment.  His HGB is stable without any significant change.  Leukopenia is noted.  No recent infections or hospitalizations associated with infections.  If this becomes an issue GSF support can be considered.  Not necessary now.  Stable.  Neutrophil count is stable without any major changes.  Re-dressing of right upper arm completed by myself and nursing.  Cutting of scab from Band-Aid was completed.  New dressing placed and recommended he keep dressing in place for 48 hours.  Labs in 8 weeks: CBC diff, CMET, iron/TIBC, ferritin.  Return in 8 weeks for follow-up.  Chart is reviewed.  I agree with SNF placement and will defer to primary care provider.        Musculoskeletal and Integument   GEN OSTEOARTHROSIS INVOLVING MULTIPLE SITES   Last Assessment & Plan 06/12/2015 Office Visit Written 07/09/2015  8:31 PM by Fayrene Helper, MD    Continue current medication      Pigmented skin lesion   Last Assessment & Plan 04/05/2016 Office Visit Written 04/10/2016 10:40 PM by Fayrene Helper, MD    Hyperpigmented lesion under rigth 4th fingernail, increasing in size, also anterior chest lesion with varied pigmentation. Pt has h/o melanoma in past  Years. Refer to dermatology for skin evaluation        Genitourinary   INTERSTITIAL CYSTITIS   Last Assessment & Plan 06/12/2015 Office Visit Written 07/09/2015  8:36 PM by Fayrene Helper, MD    Followed by  urology has implanted device as well as is on medication for symptoms, still c/o excessive nocturia and wants to stop his diuretic, the importance of maintaining medications as prescribed for heart failure protection is again discussed        Hematopoietic and Hemostatic   Pancytopenia [facility])   Last Assessment & Plan 05/10/2016 Office Visit Written 05/16/2016 10:01 PM by Fayrene Helper, MD    Unchanged, has ahd hematology eval in the past on several occasions        Other   ERECTILE DYSFUNCTION   Last Assessment & Plan 05/10/2011 Office Visit Written 05/10/2011 10:00 AM by Fayrene Helper, MD    Pt states that the cialis is not working as well, he still has a lot, advised him to discuss further with his urologist      Glaucoma   Last Assessment & Plan 02/25/2015 Office Visit Written 04/04/2015 11:06 PM  by Fayrene Helper, MD    Followed by opthalmology and compliant with drops      Biventricular cardiac pacemaker in situ   Last Assessment & Plan 11/12/2015 Office Visit Written 11/17/2015  2:09 PM by Fayrene Helper, MD    Current proposal per spouse is that she learn how to monitor from home, she is overwhelmed ans states she is incapable wants an alternative arrangement, requests [facility] help again      Abnormal gait   Last Assessment & Plan 11/12/2015 Office Visit Written 11/17/2015  2:01 PM by Fayrene Helper, MD    High fall risk and incapable of self care , will refer to [facility] for evaluation for any possible assistance, he lives with his spouse who is also aged and less capable of independent living, however , they both still prefer to live in their own home together at this time      Recurrent falls   Last Assessment & Plan 02/20/2016 Office Visit Written 02/20/2016  3:42 PM by Fayrene Helper, MD    Increased weakness , recurrent falls, spouse states she is now ready to get in home health twice weekly, will ask for [facility] help  To navigate the process      Dyspnea   Last  Assessment & Plan 01/06/2015 Office Visit Written 02/09/2015  5:28 PM by Fayrene Helper, MD    Recent increase with worsened  exercise tolerance and recurrent falls, pt sent to [facility] for further eval      Fatigue   Last Assessment & Plan 10/01/2015 Office Visit Edited 10/01/2015  4:01 PM by Erlene Quan, PA-C    His main complaint today is fatigue x 1 week. "just feel run down"      Chronic back pain   Last Assessment & Plan 04/05/2016 Office Visit Edited 04/10/2016 10:36 PM by Fayrene Helper, MD    Reports poor control on current regime, manged by ortho who he sees regularly, and he also is on fentanyl patch. No evidence of uncontrolled pain and discomfort at office visits      Generalized anxiety disorder   Last Assessment & Plan 04/05/2016 Office Visit Written 04/10/2016 10:34 PM by Fayrene Helper, MD    Uncontrolled , requests medication "like his wife's", low dose buspar prescribed          Imaging: No results found.

## 2016-08-19 NOTE — Progress Notes (Signed)
Cardiology Office Note   Date:  08/19/2016   ID:  [name], DOB [birth year], MRN [id number]  PCP:  [name], MD  Cardiologist: [name], NP   No chief complaint on file.     History of Present Illness: [name] is a 82 y.o. male who presents for ongoing assessment and management of CHF, nonischemic cardiomyopathy, and hypertension. The patient has a long history of dementia, debilitation, and is cared for by his wife who also is very frail. The patient has repeatedly refused skilled nursing home admission.. On last office visit in his wife for more inclined to be placed at an assisted-living facility, however this did not occur. The patient has difficulty with taking Lasix as he feels that it causes him to urinate too much. The patient has had follow-up [facility] and physical therapy at home. He is not on anticoagulation for atrial fibrillation, on aspirin only. He was to continue follow-ups with Dr. Lovena Le for ICD in situ.  He comes a with complaints of "feeling sick and tired", also complaining of lower extremity pain, anorexia. His wife who was with him states that he has been taking his medications as directed. He has not taken his Lasix today as he has had appointments today. His weight is up from 151 pounds 163 pounds. He is to be on oxygen, but is not wearing it today as he comes to this appointment and is mildly dyspneic.  Past Medical History:  Diagnosis Date  . A-fib ([place])   . Alzheimer disease   . Arthritis   . Asymptomatic carotid artery stenosis    BILATERAL MILD ICA---  <50% PER DUPLEX 03-08-2008  . BPH (benign prostatic hypertrophy)   . Cancer ([place])   . Cardiac pacemaker    CARDIOLOGIST-- DR Cristopher Peru (LAST PACE [facility] 05-15-2013)  . CHB (complete heart block) ([place])   . Chronic back pain   . Chronic pancreatitis ([place])    Based on CT findings  . Chronic systolic heart failure ([place])   . Costochondritis, acute   . Dementia   . Erectile  dysfunction   . Glaucoma   . History of melanoma in situ    JAN 2012--  LEFT HEEL W/ SLN BX  . History of syncope   . HOH (hard of hearing)   . Hypertension   . IC (interstitial cystitis)   . Nonischemic cardiomyopathy ([place])   . Pancytopenia   . Poor historian   . Rash and other nonspecific skin eruption   . Severe mitral regurgitation   . Urge urinary incontinence   . Visual changes     Past Surgical History:  Procedure Laterality Date  . BACK SURGERY  X3  LAST ONE'90's  . CATARACT EXTRACTION, BILATERAL  left  1997/   right 2003  . COLONOSCOPY  08/18/2006   [id number] granularity and erosions of the rectum of uncertain significance biopsied.  A long redundant, but otherwise normal-appearing colon.melanosi coli and minimal inflammation on bx  . COLONOSCOPY N/A 08/05/2014   Procedure: COLONOSCOPY;  Surgeon: [name], MD;  Location[facility];  Service: Endoscopy;  Laterality: N/A;  PT NEEDS TCS AT 1000.  Marland Kitchen CYSTO/ HOD/  REPLACEMENT INTERSTIM IMPLANT  05-14-2003  . CYSTOSCOPY W/ RETROGRADES N/A 02/11/2016   Procedure: CYSTOSCOPY WITH RETROGRADE PYELOGRAM;  Surgeon: Cleon Gustin, MD;  Location: [facility] ORS;  Service: Urology;  Laterality: N/A;  . ESOPHAGOGASTRODUODENOSCOPY N/A 03/28/2014   Dr. [name]: normal esophagus s/p dilation, mosaic appearance -  chronic inflammation noted on bx  . IMPLANTABLE CARDIOVERTER DEFIBRILLATOR (ICD) GENERATOR CHANGE N/A 10/11/2014   Procedure: ICD GENERATOR CHANGE;  Surgeon: [name], MD;  Location: [facility] CATH LAB;  Service: Cardiovascular;  Laterality: N/A;  . INGUINAL HERNIA REPAIR Bilateral AS TEEN  . INTERSTIM IMPLANT PLACEMENT  2001  &  2007  . INTERSTIM IMPLANT REVISION N/A 08/28/2013   Procedure: REPLACEMENT OF INTERSTIM-GENERATOR AND LEAD ;  Surgeon: [name], MD;  Location: [place];  Service: Urology;  Laterality: N/A;  . LEFT ELBOW SURGERY  2002  . MALONEY DILATION N/A 03/28/2014   Procedure: Venia Minks  DILATION;  Surgeon: [name], MD;  [facility];  Service: Endoscopy;  Laterality: N/A;  . PACEMAKER INSERTION  12-06-2008  DR [name]   BiV PPM  --  [employer]  . REMOVAL INTERSTIM IMPLANT/  CYST/  HOD  04-14-2004  . REVISION INTERSTIM IMPLANT AND REPLACE GENERATOR  05-15-2009   left upper buttock for urge urinary incontinence  . SAVORY DILATION N/A 03/28/2014   Procedure: SAVORY DILATION;  Surgeon: [name], MD;  [facility];  Service: Endoscopy;  Laterality: N/A;  . TONSILLECTOMY  1950  . TRANSTHORACIC ECHOCARDIOGRAM  12-06-2008   LVSF 55-60%/  MODERATE MR/  MILD AR/  QUESTION DISTAL POSTERIOR HYPOKINESIS  . TRANSURETHRAL RESECTION OF BLADDER TUMOR WITH GYRUS (TURBT-GYRUS) N/A 02/11/2016   Procedure: TRANSURETHRAL RESECTION OF BLADDER TUMOR WITH GYRUS (TURBT-GYRUS);  Surgeon: Cleon Gustin, MD;  Location: [facility] OR[facility];  Service: Urology;  Laterality: N/A;  . WIDE EXCISION LEFT HEEL AND SLN BX  JUNE 2012     Current Outpatient Prescriptions  Medication Sig Dispense Refill  . acetaminophen (TYLENOL) 650 MG CR tablet Take 650 mg by mouth every 8 (eight) hours as needed for pain.     Marland Kitchen aspirin EC 81 MG EC tablet Take 1 tablet (81 mg total) by mouth daily.    . busPIRone (BUSPAR) 5 MG tablet TAKE 1 TABLET BY MOUTH EVERY DAY AS NEEDED FOR ANXIETY 90 tablet 1  . Carboxymethylcellul-Glycerin 0.5-0.9 % SOLN Apply 1 drop to eye daily. One drop both eyes     . diclofenac sodium (VOLTAREN) 1 % GEL Apply 2 g topically daily as needed (pain). Reported on 01/05/2016    . dorzolamide (TRUSOPT) 2 % ophthalmic solution Place 1 drop into both eyes daily. One drop both eyes     . ENSURE (ENSURE) Take 237 mLs by mouth daily. Pt takes daily     . fentaNYL (DURAGESIC - DOSED MCG/HR) 25 MCG/HR patch Place 1 patch (25 mcg total) onto the skin every 3 (three) days. 10 patch 0  . furosemide (LASIX) 20 MG tablet Take 1 tablet (20 mg total) by mouth daily. May take an additional 20  mg in the afternoon AS NEEDED 90 tablet 0  . ibuprofen (ADVIL,MOTRIN) 200 MG tablet Take 400 mg by mouth every 6 (six) hours as needed for moderate pain.    Marland Kitchen KLOR-CON M20 20 MEQ tablet TAKE 1 TABLET (20 MEQ TOTAL) BY MOUTH DAILY. 30 tablet 0  . latanoprost (XALATAN) 0.005 % ophthalmic solution Place 1 drop into both eyes at bedtime. Both eyes     . Meth-Hyo-M Bl-Na Phos-Ph Sal (URIBEL) 118 MG CAPS Take 1 capsule (118 mg total) by mouth 2 (two) times daily as needed (bladder). 30 capsule 0  . Multiple Vitamins-Minerals (CENTRUM SILVER 50+MEN) TABS Take 1 tablet by mouth daily. Daily     .  polyethylene glycol powder (GLYCOLAX/MIRALAX) powder Take 17 g by mouth daily. (Patient taking differently: Take 17 g by mouth daily as needed for mild constipation. ) 3350 g 1   No current facility-administered medications for this visit.     Allergies:   Codeine and Penicillins    Social History:  The patient  reports that he quit smoking about 19 years ago. His smoking use included Cigarettes. He has a 0.60 pack-year smoking history. He has never used smokeless tobacco. He reports that he does not drink alcohol or use drugs.   Family History:  The patient's family history includes Cancer (age of onset: 32) in his father; Dementia in his mother; Heart disease in his brother; Lung cancer in his sister; Throat cancer in his father.    ROS: All other systems are reviewed and negative. Unless otherwise mentioned in H&P    PHYSICAL EXAM: VS:  There were no vitals taken for this visit. , BMI There is no height or weight on file to calculate BMI. GEN: Well nourished, well developed, in no acute distress  HEENT: normal  Neck: no JVD, carotid bruits, or masses Cardiac: [id number] holosystolic murmur, rubs, or gallops, 1+ to 2+ pretibial and ankle edema.  Respiratory:  Bilateral crackles to the middle lobes, no wheezing or coughing. GI: soft, nontender, nondistended, + BS MS: no deformity or atrophy  Skin: warm  and dry, no rash Neuro:  Strength and sensation are intact Psych: euthymic mood, full affect   Recent Labs: 02/14/2016: ALT 18 05/10/2016: TSH 4.23 06/28/2016: B Natriuretic Peptide 723.0; Hemoglobin 9.9; Platelets 85 07/21/2016: BUN 28; Creat 1.12; Potassium 4.0; Sodium 143    Lipid Panel    Component Value Date/Time   CHOL 179 06/19/2013 1241   TRIG 66 06/19/2013 1241   HDL 51 06/19/2013 1241   CHOLHDL 3.5 06/19/2013 1241   VLDL 13 06/19/2013 1241   LDLCALC 115 (H) 06/19/2013 1241      Wt Readings from Last 3 Encounters:  08/09/16 151 lb (68.5 kg)  07/21/16 161 lb 0.6 oz (73 kg)  07/12/16 156 lb (70.8 kg)      Other studies Reviewed: Additional studies/ records that were reviewed today include: .Echocardiogram: Review of the above records demonstrates:   Left ventricle: The cavity size was normal. There was moderate   concentric hypertrophy. Systolic function was normal. The   estimated ejection fraction was in the range of 55% to 60%. Wall   motion was normal; there were no regional wall motion   abnormalities. - Aortic valve: Trileaflet; mildly thickened leaflets. There was   mild to moderate regurgitation. - Aorta: Mild aortic root dilatation. Aortic root dimension: 41 mm   (ED). - Mitral valve: Mild anterior leaflet prolapse. Moderately   thickened leaflets . Moderate to severe, posteriorly directed   regurgitation. - Left atrium: The atrium was severely dilated. - Right ventricle: Pacer wire or catheter noted in right ventricle.   Systolic function was mildly reduced. - Right atrium: The atrium was moderately to severely dilated.   Pacer wire or catheter noted in right atrium. - Tricuspid valve: There was moderate-severe regurgitation. - Pulmonic valve: There was mild regurgitation. - Pulmonary arteries: Systolic pressure was severely increased. PA   peak pressure: 69 mm Hg (S). - Systemic veins: Dilated IVC with normal respiratory variation.   Estimated  CVP 8 mmHg. - Pericardium, extracardiac: Small pericardial effusion. No   evidence for tamponade physiology.   ASSESSMENT AND PLAN:  1.  Chronic diastolic heart  failure: He has gained weight and does have some evidence of lower extremity edema although not significant. He is not taking his Lasix today and admits to not taking it consistently although his wife does give it to him. I'm getting go ahead and get a chest x-ray PA and lateral to evaluate for CHF, despite diastolic heart failure he is having some breathing issues. We'll also check a BNP, CBC and a BMET. I've advised him to take his Lasix when he returns home and to wear his oxygen.  We may need to increase his Lasix if there is evidence of fluid retention. I've advised him of that. He verbalizes understanding.  2. Atrial fibrillation: Heart rate is controlled currently. He is not on anticoagulation but remains on aspirin 81 mg daily.  3. Oxygen dependent respiratory insufficiency: He is advised to wear his oxygen at all times as directed. Even when he is away from home.  4. ICD in situ: Continue follow-up interrogations per protocol with Dr. Lovena Le.  Current medicines are reviewed at length with the patient today.    Labs/ tests ordered today include: PA and lateral chest x-ray, BMET, CBC BNP. No orders of the defined types were placed in this encounter.    Disposition:   FU with one month Signed, Jory Sims, NP  08/19/2016 7:00 AM    [facility]. [address] [place], [place] [address] Phone: [phone]; Fax: [phone]

## 2016-08-19 NOTE — Patient Instructions (Signed)
Your physician recommends that you schedule a follow-up appointment in: 1 Month with Jory Sims, NP   Your physician recommends that you continue on your current medications as directed. Please refer to the Current Medication list given to you today.  Take Your Lasix When You Get Home   Have Chest X-Ray done today  Your physician recommends that you return for lab work in: Today  If you need a refill on your cardiac medications before your next appointment, please call your pharmacy.  Thank you for choosing [facility]!

## 2016-08-23 ENCOUNTER — Telehealth: Payer: Self-pay

## 2016-08-23 ENCOUNTER — Other Ambulatory Visit: Payer: Self-pay | Admitting: *Deleted

## 2016-08-23 DIAGNOSIS — Z79899 Other long term (current) drug therapy: Secondary | ICD-10-CM

## 2016-08-23 MED ORDER — METOLAZONE 2.5 MG PO TABS: ORAL_TABLET | ORAL | 0 refills | Status: DC

## 2016-08-23 NOTE — Patient Outreach (Signed)
Follow up phone call successful.  Spoke with pt's spouse [name] (on consent form), discussed got  a call from MD office this am to give pt 2 fluid pills twice a day for 2 days + increase the Potassium to twice a day as well.   HIPAA verified on pt. Spouse reports she gave pt 2 water pills this am but discovered he only took one, found one on the floor.   Spouse reports she is trying the best she can.   Spouse reports she is on her way to pick up the one pill (Metolazone) from  MD office now, plus a prescription for new dosage of Lasix was called in.  RN CM reviewed with spouse pt's med changes to which she voiced understanding.   RN CM inquired of spouse if pt was evaluated for Palliative care services to which reports [facility] RN still coming, came out last week, said she would take care of it.     Plan:   As discussed with spouse, plan to follow up again next week telephonically, check on pt's status.    [name].   [facility]  [phone]

## 2016-08-23 NOTE — Telephone Encounter (Signed)
Spoke with both wife and pt,they will increase lasix to BID for 2 days along with the potassium,she will get metolazone from pharmacy,we moved up f/u apt to 12/7 at 3:30 pm with NP,I will mail lab slip   I will forward NP note to [facility]

## 2016-08-23 NOTE — Telephone Encounter (Signed)
-----   Message from Lendon Colonel, NP sent at 08/19/2016  5:30 PM EST ----- Increase lasix to 40 mg BID for two days, and reduce to 40 mg daily. If no diureses, he will need to come to hospital for IV diureses. Give him one dose of metolazone 2.5 mg po X 1. Repeat his BMET on Monday. He will need to increase potassium to BID as well. Still awaiting CXR results. Should be seen in a couple of weeks, not a month based upon his labs.

## 2016-08-27 DIAGNOSIS — I4891 Unspecified atrial fibrillation: Secondary | ICD-10-CM | POA: Diagnosis not present

## 2016-08-27 DIAGNOSIS — D61818 Other pancytopenia: Secondary | ICD-10-CM | POA: Diagnosis not present

## 2016-08-27 DIAGNOSIS — G309 Alzheimer's disease, unspecified: Secondary | ICD-10-CM | POA: Diagnosis not present

## 2016-08-27 DIAGNOSIS — F0280 Dementia in other diseases classified elsewhere without behavioral disturbance: Secondary | ICD-10-CM | POA: Diagnosis not present

## 2016-08-27 DIAGNOSIS — I5022 Chronic systolic (congestive) heart failure: Secondary | ICD-10-CM | POA: Diagnosis not present

## 2016-08-27 DIAGNOSIS — I110 Hypertensive heart disease with heart failure: Secondary | ICD-10-CM | POA: Diagnosis not present

## 2016-08-30 ENCOUNTER — Telehealth: Payer: Self-pay

## 2016-08-30 NOTE — Telephone Encounter (Signed)
Late entry---Patient was in the office 08/23/2016 to collect his narcotic pain prescription and turned around to leave and stumbled and fell on his left side in the lobby. [name] and I went and made sure he was ok and he stated that he was fine and it happened all the time at home. We helped him up to a chair and he sat until we located his wife and she came up to get him and I pushed him to the car in the wheelchair. He was advised to go to the ER if he developed pain or any new symptoms after the fall

## 2016-08-31 ENCOUNTER — Ambulatory Visit (INDEPENDENT_AMBULATORY_CARE_PROVIDER_SITE_OTHER): Payer: Medicare Other

## 2016-08-31 VITALS — BP 106/74 | HR 78 | Resp 18 | Ht 67.0 in | Wt 156.0 lb

## 2016-08-31 DIAGNOSIS — Z0000 Encounter for general adult medical examination without abnormal findings: Secondary | ICD-10-CM

## 2016-08-31 NOTE — Patient Instructions (Signed)
Thank you for choosing [facility] for your health care needs  The Annual Wellness Visit is designed to allow [place] the chance to assist you in preserving and improving you health.   Dr. Moshe Cipro will see you back in February  If you need any blood work I will mail that to you.  I will get in touch with [facility] to see if there are any services to help with legal matters

## 2016-09-01 ENCOUNTER — Other Ambulatory Visit: Payer: Self-pay | Admitting: *Deleted

## 2016-09-01 NOTE — Patient Outreach (Signed)
Follow up phone call successful- check on pt's status (edema, medication adherence).   Spoke with spouse [name] (on consent form), verified HIPAA on pt.  Spouse confirmed with RN CM that she gave  Pt  the increased dosage of  Lasix and  Potassium as ordered for 2 days last week  as well as giving him him the one dose of Metolazone.  RN CM inquired of spouse if pt is currently taking 40 mg of Lasix daily as ordered to which spouse recalls a new prescription was called in but did not pick it up.  Spouse reports she has been giving pt the  Lasix 20 mg daily, swelling is better.  RN CM discussed with spouse need to pick up new prescription of Lasix 40 mg for pt to start taking daily as ordered to which spouse reports can pick up tomorrow, voiced understanding to give pt two 20 mg tablets today (equal 40 mg total).  Spouse reports she is doing the best she can.  Spouse reports  Pt saw Dr. Griffin Dakin  nurse yesterday to find out what is the best thing for him to do, talked about Hospice, turned it down.  Spouse reports nurse told her to ask RN CM about other services. RN CM discussed with spouse Palliative Care services,benefits to which spouse reports pt would go along with that, agreed to have RN CM call Dr. Griffin Dakin nurse for referral for services.   Spouse reports nurse also got on pt about taking his pills.   Spouse reports HH RN's came last week, her last day.  Spouse reports she has not been weighing pt, will try to get pt to weigh today.   Plan:  As discussed with spouse, plan to call Dr. Griffin Dakin nurse about request for referral for Palliative care services for pt.             As discussed, RN CM to follow up with pt next week- home visit.    [name].   [facility]  [phone]

## 2016-09-02 ENCOUNTER — Other Ambulatory Visit (HOSPITAL_COMMUNITY): Payer: Self-pay | Admitting: Oncology

## 2016-09-02 DIAGNOSIS — E876 Hypokalemia: Secondary | ICD-10-CM

## 2016-09-02 NOTE — Progress Notes (Signed)
Subjective:    [name] is a 82 y.o. male who presents for Medicare Annual/Subsequent preventive examination.   Preventive Screening-Counseling & Management  Tobacco History  Smoking Status  . Former Smoker  . Packs/day: 0.02  . Years: 30.00  . Types: Cigarettes  . Quit date: 08/24/1997  Smokeless Tobacco  . Never Used     Current Problems (verified) Patient Active Problem List   Diagnosis Date Noted  . Generalized anxiety disorder 04/05/2016  . Pigmented skin lesion 04/05/2016  . Acute CHF (congestive heart failure) ([place]) 03/15/2016  . Tricuspid regurgitation 03/15/2016  . Pulmonary hypertension 10/01/2015  . Fatigue 10/01/2015  . Chronic back pain 10/01/2015  . Myelodysplasia 06/20/2015  . Dyspnea 02/09/2015  . Recurrent falls 08/01/2014  . Atrial fibrillation, persistent ([place]) 02/22/2014  . Bilateral leg weakness 05/01/2013  . Abnormal gait 06/12/2012  . GERD (gastroesophageal reflux disease) 05/09/2012  . Alzheimer's dementia 11/17/2011  . GEN OSTEOARTHROSIS INVOLVING MULTIPLE SITES 11/25/2009  . Central hearing loss 11/10/2009  . Biventricular cardiac pacemaker in situ 07/22/2009  . INTERSTITIAL CYSTITIS 07/14/2009  . Pancytopenia ([place]) 12/31/2008  . MITRAL REGURGITATION, MODERATE 12/31/2008  . Chronic systolic heart failure ([place]) 12/31/2008  . ERECTILE DYSFUNCTION 01/31/2008  . Glaucoma 01/31/2008  . Essential hypertension 01/31/2008    Medications Prior to Visit Current Outpatient Prescriptions on File Prior to Visit  Medication Sig Dispense Refill  . acetaminophen (TYLENOL) 650 MG CR tablet Take 650 mg by mouth every 8 (eight) hours as needed for pain.     Marland Kitchen aspirin EC 81 MG EC tablet Take 1 tablet (81 mg total) by mouth daily.    . busPIRone (BUSPAR) 5 MG tablet TAKE 1 TABLET BY MOUTH EVERY DAY AS NEEDED FOR ANXIETY 90 tablet 1  . Carboxymethylcellul-Glycerin 0.5-0.9 % SOLN Apply 1 drop to eye daily. One drop both eyes     . diclofenac sodium  (VOLTAREN) 1 % GEL Apply 2 g topically daily as needed (pain). Reported on 01/05/2016    . dorzolamide (TRUSOPT) 2 % ophthalmic solution Place 1 drop into both eyes daily. One drop both eyes     . ENSURE (ENSURE) Take 237 mLs by mouth daily. Pt takes daily     . fentaNYL (DURAGESIC - DOSED MCG/HR) 25 MCG/HR patch Place 1 patch (25 mcg total) onto the skin every 3 (three) days. 10 patch 0  . ibuprofen (ADVIL,MOTRIN) 200 MG tablet Take 400 mg by mouth every 6 (six) hours as needed for moderate pain.    Marland Kitchen KLOR-CON M20 20 MEQ tablet TAKE 1 TABLET (20 MEQ TOTAL) BY MOUTH DAILY. 30 tablet 0  . latanoprost (XALATAN) 0.005 % ophthalmic solution Place 1 drop into both eyes at bedtime. Both eyes     . Meth-Hyo-M Bl-Na Phos-Ph Sal (URIBEL) 118 MG CAPS Take 1 capsule (118 mg total) by mouth 2 (two) times daily as needed (bladder). 30 capsule 0  . metolazone (ZAROXOLYN) 2.5 MG tablet Take 1 tablet today before your extra lasix pill this afternoon 1 tablet 0  . Multiple Vitamins-Minerals (CENTRUM SILVER 50+MEN) TABS Take 1 tablet by mouth daily. Daily     . polyethylene glycol powder (GLYCOLAX/MIRALAX) powder Take 17 g by mouth daily. (Patient taking differently: Take 17 g by mouth daily as needed for mild constipation. ) 3350 g 1  . furosemide (LASIX) 20 MG tablet Take 1 tablet (20 mg total) by mouth daily. May take an additional 20 mg in the afternoon AS NEEDED 90 tablet 0  No current facility-administered medications on file prior to visit.     Current Medications (verified) Current Outpatient Prescriptions  Medication Sig Dispense Refill  . acetaminophen (TYLENOL) 650 MG CR tablet Take 650 mg by mouth every 8 (eight) hours as needed for pain.     Marland Kitchen amLODipine (NORVASC) 2.5 MG tablet     . aspirin EC 81 MG EC tablet Take 1 tablet (81 mg total) by mouth daily.    . busPIRone (BUSPAR) 5 MG tablet TAKE 1 TABLET BY MOUTH EVERY DAY AS NEEDED FOR ANXIETY 90 tablet 1  . Carboxymethylcellul-Glycerin 0.5-0.9 %  SOLN Apply 1 drop to eye daily. One drop both eyes     . diclofenac sodium (VOLTAREN) 1 % GEL Apply 2 g topically daily as needed (pain). Reported on 01/05/2016    . dorzolamide (TRUSOPT) 2 % ophthalmic solution Place 1 drop into both eyes daily. One drop both eyes     . ENSURE (ENSURE) Take 237 mLs by mouth daily. Pt takes daily     . fentaNYL (DURAGESIC - DOSED MCG/HR) 25 MCG/HR patch Place 1 patch (25 mcg total) onto the skin every 3 (three) days. 10 patch 0  . ibuprofen (ADVIL,MOTRIN) 200 MG tablet Take 400 mg by mouth every 6 (six) hours as needed for moderate pain.    Marland Kitchen KLOR-CON M20 20 MEQ tablet TAKE 1 TABLET (20 MEQ TOTAL) BY MOUTH DAILY. 30 tablet 0  . latanoprost (XALATAN) 0.005 % ophthalmic solution Place 1 drop into both eyes at bedtime. Both eyes     . Meth-Hyo-M Bl-Na Phos-Ph Sal (URIBEL) 118 MG CAPS Take 1 capsule (118 mg total) by mouth 2 (two) times daily as needed (bladder). 30 capsule 0  . metolazone (ZAROXOLYN) 2.5 MG tablet Take 1 tablet today before your extra lasix pill this afternoon 1 tablet 0  . Multiple Vitamins-Minerals (CENTRUM SILVER 50+MEN) TABS Take 1 tablet by mouth daily. Daily     . polyethylene glycol powder (GLYCOLAX/MIRALAX) powder Take 17 g by mouth daily. (Patient taking differently: Take 17 g by mouth daily as needed for mild constipation. ) 3350 g 1  . furosemide (LASIX) 20 MG tablet Take 1 tablet (20 mg total) by mouth daily. May take an additional 20 mg in the afternoon AS NEEDED 90 tablet 0   No current facility-administered medications for this visit.      Allergies (verified) Codeine and Penicillins   PAST HISTORY  Family History Family History  Problem Relation Age of Onset  . Dementia Mother   . Throat cancer Father   . Cancer Father 86    throat  . Lung cancer Sister   . Heart disease Brother     Social History Social History  Substance Use Topics  . Smoking status: Former Smoker    Packs/day: 0.02    Years: 30.00    Types:  Cigarettes    Quit date: 08/24/1997  . Smokeless tobacco: Never Used  . Alcohol use No    Are there smokers in your home (other than you)?  No  Risk Factors Current exercise habits: The patient does not participate in regular exercise at present.  Dietary issues discussed: Reduced sodium diet    Cardiac risk factors: advanced age (older than 14 for men, 62 for women), hypertension and male gender.  Depression Screen (Note: if answer to either of the following is "Yes", a more complete depression screening is indicated)   Q1: Over the past two weeks, have you felt down, depressed  or hopeless? No  Q2: Over the past two weeks, have you felt little interest or pleasure in doing things? No  Have you lost interest or pleasure in daily life? No  Do you often feel hopeless? No  Do you cry easily over simple problems? No  Activities of Daily Living In your present state of health, do you have any difficulty performing the following activities?:  Driving? Yes Managing money?  Yes Feeding yourself? No Getting from bed to chair? Yes due to use of assistive device  Climbing a flight of stairs? Yes due to use of assistive device and gait instability  Preparing food and eating?: No Bathing or showering? No Getting dressed: No Getting to the toilet? Yes Using the toilet:No Moving around from place to place: Yes In the past year have you fallen or had a near fall?:Yes   Are you sexually active?  No  Do you have more than one partner?  Na   Hearing Difficulties: Yes Do you often ask people to speak up or repeat themselves? Yes Do you experience ringing or noises in your ears? No Do you have difficulty understanding soft or whispered voices? Yes   Do you feel that you have a problem with memory? Yes  Do you often misplace items? Yes  Do you feel safe at home?  Yes  Cognitive Testing  Alert? Yes  Normal Appearance?Yes  Oriented to person? Yes  Place? Yes   Time? No  Recall of three  objects?  No  Can perform simple calculations? No  Displays appropriate judgment?Yes  Can read the correct time from a watch face?Yes   Advanced Directives have been discussed with the patient? Yes   List the Names of Other Physician/Practitioners you currently use: 1.  Dr. Jeffie Pollock (urology)  2.  Dr. Harl Bowie (cardiology)  Indicate any recent Medical Services you may have received from other than [facility] providers in the past year (date may be approximate).  Immunization History  Administered Date(s) Administered  . Influenza Split 08/10/2011, 06/21/2012  . Influenza Whole 06/29/2007, 07/08/2009, 07/08/2010  . Influenza,inj,Quad PF,36+ Mos 06/19/2013, 05/27/2014, 06/12/2015, 07/21/2016  . Pneumococcal Conjugate-13 05/20/2014  . Pneumococcal Polysaccharide-23 07/05/2006  . Td 07/05/2006  . Tdap 10/12/2012  . Zoster 04/06/2007    Screening Tests Health Maintenance  Topic Date Due  . TETANUS/TDAP  10/12/2022  . INFLUENZA VACCINE  Completed  . ZOSTAVAX  Completed  . PNA vac Low Risk Adult  Completed    All answers were reviewed with the patient and necessary referrals were made:  Vanetta Mulders, LPN   [id number]   History reviewed: allergies, current medications, past family history, past medical history, past social history, past surgical history and problem list  Review of Systems A comprehensive review of systems was negative.    Objective:     Vision by Snellen chart: right [id number] to measure, left [id number] to measure Blood pressure 106/74, pulse 78, resp. rate 18, height 5\' 7"  (1.702 m), weight 156 lb (70.8 kg), SpO2 (!) 88 %. Body mass index is 24.43 kg/m.  No exam performed today, annual wellness without physical exam.     Assessment:        Plan:     During the course of the visit the patient was educated and counseled about appropriate screening and preventive services including:    Advanced directives: would like help form elder lawyer  or palliative care   Diet review for nutrition referral? Yes ____  Not Indicated ___x_  Patient Instructions (the written plan) was given to the patient.  Medicare Attestation I have personally reviewed: The patient's medical and social history Their use of alcohol, tobacco or illicit drugs Their current medications and supplements The patient's functional ability including ADLs,fall risks, home safety risks, cognitive, and hearing and visual impairment Diet and physical activities Evidence for depression or mood disorders  The patient's weight, height, BMI, and visual acuity have been recorded in the chart.  I have made referrals, counseling, and provided education to the patient based on review of the above and I have provided the patient with a written personalized care plan for preventive services.     Denman George [place], [place]   [id number]

## 2016-09-02 NOTE — Progress Notes (Signed)
Patient has concerns about medications and not wanting to take fluid pills.  He is considering hospice/palliative care because of the above.  Discussed this in detail with the patient.  Would like evaluation for palliative care.

## 2016-09-03 ENCOUNTER — Other Ambulatory Visit: Payer: Self-pay | Admitting: *Deleted

## 2016-09-03 NOTE — Patient Outreach (Signed)
Late entry:   11/29 received a return phone call from [place] (Dr Simpson's nurse) to voice message left earlier about Palliative care services for pt.   This RN CM discussed with Loma Sousa follow up call with pt's spouse today, talked about benefits of Palliative care services for pt to which spouse agreed to have RN CM call MD office and request referral.   Loma Sousa to relay this to MD.   Plan:   RN CM to follow up with pt next week- home visit.     [name].   [facility]  [phone]

## 2016-09-09 ENCOUNTER — Ambulatory Visit (INDEPENDENT_AMBULATORY_CARE_PROVIDER_SITE_OTHER): Payer: Medicare Other | Admitting: Adult Health

## 2016-09-09 ENCOUNTER — Encounter: Payer: Self-pay | Admitting: Adult Health

## 2016-09-09 ENCOUNTER — Other Ambulatory Visit: Payer: Self-pay | Admitting: *Deleted

## 2016-09-09 VITALS — BP 106/68 | HR 63 | Ht 67.0 in | Wt 153.0 lb

## 2016-09-09 DIAGNOSIS — I5030 Unspecified diastolic (congestive) heart failure: Secondary | ICD-10-CM | POA: Diagnosis not present

## 2016-09-09 DIAGNOSIS — D508 Other iron deficiency anemias: Secondary | ICD-10-CM | POA: Diagnosis not present

## 2016-09-09 DIAGNOSIS — R54 Age-related physical debility: Secondary | ICD-10-CM

## 2016-09-09 DIAGNOSIS — R0609 Other forms of dyspnea: Secondary | ICD-10-CM | POA: Diagnosis not present

## 2016-09-09 DIAGNOSIS — I38 Endocarditis, valve unspecified: Secondary | ICD-10-CM

## 2016-09-09 DIAGNOSIS — I255 Ischemic cardiomyopathy: Secondary | ICD-10-CM

## 2016-09-09 NOTE — Progress Notes (Signed)
Cardiology Office Note   Date:  09/09/2016   ID:  [name], DOB 02/19/1926, MRN [id number]  PCP:  [name], MD  Cardiologist: [name], NP   No chief complaint on file.     History of Present Illness: [name] is a 82 y.o. male who presents for ongoing assessment and management of CHF, nonischemic cardiomyopathy, and hypertension. The patient has a long history of dementia, debilitation, and is cared for by his wife who also is very frail. The patient has repeatedly refused skilled nursing home admission.  On last office visit, he had some evidence of decompensated CHF. He had not been taking is lasix consistently. I checked labs to include BMET, BNP, and CBC. He was advised to wear his oxygen as directed.   Labs revealed:  Na 131, K+ 3.9, CL 107, Creatinine 1.29. Glucose 98, amlodipine 9.7, hematocrit 30.2, white blood cells 2.6, platelets 84. BNP 1250. He was advised to take Lasix as directed.  He comes today feeling about the same, his weight is down 3 pounds, he has fallen landing on his back, with significant lumbar soreness. O2 sat is 90% on room air, he is not wearing oxygen as directed. He remains very frail, dementia continues.    Past Medical History:  Diagnosis Date  . A-fib ([place])   . Alzheimer disease   . Arthritis   . Asymptomatic carotid artery stenosis    BILATERAL MILD ICA---  <50% PER DUPLEX 03-08-2008  . BPH (benign prostatic hypertrophy)   . Cancer ([place])   . Cardiac pacemaker    CARDIOLOGIST-- DR Cristopher Peru (LAST PACE [facility] 05-15-2013)  . CHB (complete heart block) ([place])   . Chronic back pain   . Chronic pancreatitis ([employer])    Based on CT findings  . Chronic systolic heart failure ([place])   . Costochondritis, acute   . Dementia   . Erectile dysfunction   . Glaucoma   . History of melanoma in situ    JAN 2012--  LEFT HEEL W/ SLN BX  . History of syncope   . HOH (hard of hearing)   . Hypertension   . IC (interstitial  cystitis)   . Nonischemic cardiomyopathy ([place])   . Pancytopenia   . Poor historian   . Rash and other nonspecific skin eruption   . Severe mitral regurgitation   . Urge urinary incontinence   . Visual changes     Past Surgical History:  Procedure Laterality Date  . BACK SURGERY  X3  LAST ONE'90's  . CATARACT EXTRACTION, BILATERAL  left  1997/   right 2003  . COLONOSCOPY  08/18/2006   [id number] granularity and erosions of the rectum of uncertain significance biopsied.  A long redundant, but otherwise normal-appearing colon.melanosi coli and minimal inflammation on bx  . COLONOSCOPY N/A 08/05/2014   Procedure: COLONOSCOPY;  Surgeon: [name], MD;  Location: [facility]O [facility];  Service: Endoscopy;  Laterality: N/A;  PT NEEDS TCS AT 1000.  Marland Kitchen CYSTO/ HOD/  REPLACEMENT INTERSTIM IMPLANT  05-14-2003  . CYSTOSCOPY W/ RETROGRADES N/A 02/11/2016   Procedure: CYSTOSCOPY WITH RETROGRADE PYELOGRAM;  Surgeon: Cleon Gustin, MD;  Location: [facility] ORS;  Service: Urology;  Laterality: N/A;  . ESOPHAGOGASTRODUODENOSCOPY N/A 03/28/2014   Dr. [name]: normal esophagus s/p dilation, mosaic appearance - chronic inflammation noted on bx  . IMPLANTABLE CARDIOVERTER DEFIBRILLATOR (ICD) GENERATOR CHANGE N/A 10/11/2014   Procedure: ICD GENERATOR CHANGE;  Surgeon: [name], MD;  Location: [facility] CATH LAB;  Service: Cardiovascular;  Laterality: N/A;  . INGUINAL HERNIA REPAIR Bilateral AS TEEN  . INTERSTIM IMPLANT PLACEMENT  2001  &  2007  . INTERSTIM IMPLANT REVISION N/A 08/28/2013   Procedure: REPLACEMENT OF INTERSTIM-GENERATOR AND LEAD ;  Surgeon: [name], MD;  Location: [facility];  Service: Urology;  Laterality: N/A;  . LEFT ELBOW SURGERY  2002  . MALONEY DILATION N/A 03/28/2014   Procedure: Venia Minks DILATION;  Surgeon: [name], MD;  Location[facility];  Service: Endoscopy;  Laterality: N/A;  . PACEMAKER INSERTION  12-06-2008  DR [name]   Bi[facility] PPM  --  [employer]  .  REMOVAL INTERSTIM IMPLANT/  CYST/  HOD  04-14-2004  . REVISION INTERSTIM IMPLANT AND REPLACE GENERATOR  05-15-2009   left upper buttock for urge urinary incontinence  . SAVORY DILATION N/A 03/28/2014   Procedure: SAVORY DILATION;  Surgeon: [name], MD;  Location[facility];  Service: Endoscopy;  Laterality: N/A;  . TONSILLECTOMY  1950  . TRANSTHORACIC ECHOCARDIOGRAM  12-06-2008   LVSF 55-60%/  MODERATE MR/  MILD AR/  QUESTION DISTAL POSTERIOR HYPOKINESIS  . TRANSURETHRAL RESECTION OF BLADDER TUMOR WITH GYRUS (TURBT-GYRUS) N/A 02/11/2016   Procedure: TRANSURETHRAL RESECTION OF BLADDER TUMOR WITH GYRUS (TURBT-GYRUS);  Surgeon: Cleon Gustin, MD;  Location: [facility] ORS;  Service: Urology;  Laterality: N/A;  . WIDE EXCISION LEFT HEEL AND SLN BX  JUNE 2012     Current Outpatient Prescriptions  Medication Sig Dispense Refill  . acetaminophen (TYLENOL) 650 MG CR tablet Take 650 mg by mouth every 8 (eight) hours as needed for pain.     Marland Kitchen amLODipine (NORVASC) 2.5 MG tablet     . aspirin EC 81 MG EC tablet Take 1 tablet (81 mg total) by mouth daily.    . busPIRone (BUSPAR) 5 MG tablet TAKE 1 TABLET BY MOUTH EVERY DAY AS NEEDED FOR ANXIETY 90 tablet 1  . Carboxymethylcellul-Glycerin 0.5-0.9 % SOLN Apply 1 drop to eye daily. One drop both eyes     . diclofenac sodium (VOLTAREN) 1 % GEL Apply 2 g topically daily as needed (pain). Reported on 01/05/2016    . dorzolamide (TRUSOPT) 2 % ophthalmic solution Place 1 drop into both eyes daily. One drop both eyes     . ENSURE (ENSURE) Take 237 mLs by mouth daily. Pt takes daily     . fentaNYL (DURAGESIC - DOSED MCG/HR) 25 MCG/HR patch Place 1 patch (25 mcg total) onto the skin every 3 (three) days. 10 patch 0  . ibuprofen (ADVIL,MOTRIN) 200 MG tablet Take 400 mg by mouth every 6 (six) hours as needed for moderate pain.    Marland Kitchen KLOR-CON M20 20 MEQ tablet TAKE 1 TABLET (20 MEQ TOTAL) BY MOUTH DAILY. 30 tablet 0  . latanoprost (XALATAN) 0.005 % ophthalmic  solution Place 1 drop into both eyes at bedtime. Both eyes     . Meth-Hyo-M Bl-Na Phos-Ph Sal (URIBEL) 118 MG CAPS Take 1 capsule (118 mg total) by mouth 2 (two) times daily as needed (bladder). 30 capsule 0  . Multiple Vitamins-Minerals (CENTRUM SILVER 50+MEN) TABS Take 1 tablet by mouth daily. Daily     . polyethylene glycol powder (GLYCOLAX/MIRALAX) powder Take 17 g by mouth daily. (Patient taking differently: Take 17 g by mouth daily as needed for mild constipation. ) 3350 g 1  . furosemide (LASIX) 20 MG tablet Take 1 tablet (20 mg total) by mouth daily. May take an additional 20 mg  in the afternoon AS NEEDED 90 tablet 0  . metolazone (ZAROXOLYN) 2.5 MG tablet Take 1 tablet today before your extra lasix pill this afternoon (Patient not taking: Reported on 09/09/2016) 1 tablet 0   No current facility-administered medications for this visit.     Allergies:   Codeine and Penicillins    Social History:  The patient  reports that he quit smoking about 19 years ago. His smoking use included Cigarettes. He has a 0.60 pack-year smoking history. He has never used smokeless tobacco. He reports that he does not drink alcohol or use drugs.   Family History:  The patient's family history includes Cancer (age of onset: 22) in his father; Dementia in his mother; Heart disease in his brother; Lung cancer in his sister; Throat cancer in his father.    ROS: All other systems are reviewed and negative. Unless otherwise mentioned in H&P    PHYSICAL EXAM: VS:  BP 106/68   Pulse 63   Ht 5\' 7"  (1.702 m)   Wt 153 lb (69.4 kg)   SpO2 90%   BMI 23.96 kg/m  , BMI Body mass index is 23.96 kg/m. GEN: Well nourished, well developed, in no acute distress  HEENT: normal  Neck: no JVD, radiation carotid bruits, or masses Cardiac: RRR; 2/6 systolic murmur heard best at the right sternal border and radiating into the abdomen no murmurs, rubs, or gallops,no edema  Respiratory:  clear to auscultation bilaterally,  normal work of breathing GI: soft, nontender, nondistended, + BS MS: no deformity or atrophy soreness in the lower posterior rib cage and lumbar spine.  Skin: warm and dry, no rash Neuro:  Strength and sensation are intact Psych: euthymic mood, full affect  Recent Labs: 02/14/2016: ALT 18 05/10/2016: TSH 4.23 08/19/2016: B Natriuretic Peptide 1,250.0; BUN 24; Creatinine, Ser 1.29; Hemoglobin 9.7; Platelets 84; Potassium 3.9; Sodium 141    Lipid Panel    Component Value Date/Time   CHOL 179 06/19/2013 1241   TRIG 66 06/19/2013 1241   HDL 51 06/19/2013 1241   CHOLHDL 3.5 06/19/2013 1241   VLDL 13 06/19/2013 1241   LDLCALC 115 (H) 06/19/2013 1241      Wt Readings from Last 3 Encounters:  09/09/16 153 lb (69.4 kg)  08/31/16 156 lb (70.8 kg)  08/19/16 163 lb (73.9 kg)      Other studies Reviewed: Additional studies/ records that were reviewed today include: Echocardiogram Review of the above records demonstrates: 07/23/2015 Left ventricle: The cavity size was normal. There was moderate   concentric hypertrophy. Systolic function was normal. The   estimated ejection fraction was in the range of 55% to 60%. Wall   motion was normal; there were no regional wall motion   abnormalities. - Aortic valve: Trileaflet; mildly thickened leaflets. There was   mild to moderate regurgitation. - Aorta: Mild aortic root dilatation. Aortic root dimension: 41 mm   (ED). - Mitral valve: Mild anterior leaflet prolapse. Moderately   thickened leaflets . Moderate to severe, posteriorly directed   regurgitation. - Left atrium: The atrium was severely dilated. - Right ventricle: Pacer wire or catheter noted in right ventricle.   Systolic function was mildly reduced. - Right atrium: The atrium was moderately to severely dilated.   Pacer wire or catheter noted in right atrium. - Tricuspid valve: There was moderate-severe regurgitation. - Pulmonic valve: There was mild regurgitation. - Pulmonary  arteries: Systolic pressure was severely increased. PA   peak pressure: 69 mm Hg (S). - Systemic  veins: Dilated IVC with normal respiratory variation.   Estimated CVP 8 mmHg. - Pericardium, extracardiac: Small pericardial effusion. No   evidence for tamponade physiology.  ASSESSMENT AND PLAN:  1.  Chronic diastolic heart failure: He is not medically compliant with Lasix. He remains very frail. He has fallen and does not use his walker as directed. On multiple occasions I advised skilled nursing facility placement, he continues to refuse. His wife is very frail as well and is unable to take care of him due to her own physical limitations, but she does her very best to make sure that he gets his medications. He appears well-nourished and well cared for. I will not make any changes in his medication regimen at this time. He is not a candidate for any invasive testing. I will not repeat his echocardiogram at this time. Continue medical management.  2. Chronic back pain: The patient wears that non-Duragesic pain patches. Concerned that they may have contributed to his fall versus his severe deconditioning. I advised him to use the Thermo care portable heating pads. This may help with his lower back pain without adding new medications.  3. Valvular Heart Disease. Noted on echocardiogram. There is mitral annular leaflet prolapse and moderate to severe mitral regurgitation. He is not a surgical candidate. Continue medical management.  4. Anemia with thrombocytopenia: Has not been following regularly with hematology. Last seen in January 2017 was to follow-up in March 2017 but did not go to appointment. He is being advised to reschedule and be seen.  5. Pacemaker in situ: Follow-up per Dr. Lovena Le per protocol.   I continue to worry about this frail gentleman with multiple medical problems, and his wife who is very frail and is unable to do a lot of physical help for him. It is been recommended on multiple  office visit that they consider skilled nursing facility placement. They continued to refuse.  Current medicines are reviewed at length with the patient today.    Labs/ tests ordered today include:  No orders of the defined types were placed in this encounter.    Disposition:   FU with Dr. Harl Bowie in 4 months.  Signed, Jory Sims, NP  09/09/2016 4:30 PM    [facility] [address], [place], [place] [address] Phone: [phone]; Fax: [phone]

## 2016-09-09 NOTE — Patient Instructions (Signed)
Your physician recommends that you schedule a follow-up appointment in: 4 months Dr Harl Bowie   Your physician recommends that you continue on your current medications as directed. Please refer to the Current Medication list given to you today.   Please make follow up apt with hematology [phone]      Thank you for choosing [facility] !

## 2016-09-09 NOTE — Progress Notes (Signed)
Name: [name]    DOB: Jan 31, 1926  Age: 82 y.o.  MR#: [id number]       PCP:  [name], MD      Insurance: Payor: MEDICARE / Plan: MEDICARE PART A AND B / Product Type: *No Product type* /   CC:   No chief complaint on file.   VS Vitals:   09/09/16 1530  BP: 106/68  Pulse: 63  SpO2: 90%  Weight: 153 lb (69.4 kg)  Height: 5\' 7"  (1.702 m)    Weights Current Weight  09/09/16 153 lb (69.4 kg)  08/31/16 156 lb (70.8 kg)  08/19/16 163 lb (73.9 kg)    Blood Pressure  BP Readings from Last 3 Encounters:  09/09/16 106/68  09/09/16 100/68  08/31/16 106/74     Admit date:  (Not on file) Last encounter with RMR:  08/19/2016   Allergy Codeine and Penicillins  Current Outpatient Prescriptions  Medication Sig Dispense Refill  . acetaminophen (TYLENOL) 650 MG CR tablet Take 650 mg by mouth every 8 (eight) hours as needed for pain.     Marland Kitchen amLODipine (NORVASC) 2.5 MG tablet     . aspirin EC 81 MG EC tablet Take 1 tablet (81 mg total) by mouth daily.    . busPIRone (BUSPAR) 5 MG tablet TAKE 1 TABLET BY MOUTH EVERY DAY AS NEEDED FOR ANXIETY 90 tablet 1  . Carboxymethylcellul-Glycerin 0.5-0.9 % SOLN Apply 1 drop to eye daily. One drop both eyes     . diclofenac sodium (VOLTAREN) 1 % GEL Apply 2 g topically daily as needed (pain). Reported on 01/05/2016    . dorzolamide (TRUSOPT) 2 % ophthalmic solution Place 1 drop into both eyes daily. One drop both eyes     . ENSURE (ENSURE) Take 237 mLs by mouth daily. Pt takes daily     . fentaNYL (DURAGESIC - DOSED MCG/HR) 25 MCG/HR patch Place 1 patch (25 mcg total) onto the skin every 3 (three) days. 10 patch 0  . ibuprofen (ADVIL,MOTRIN) 200 MG tablet Take 400 mg by mouth every 6 (six) hours as needed for moderate pain.    Marland Kitchen KLOR-CON M20 20 MEQ tablet TAKE 1 TABLET (20 MEQ TOTAL) BY MOUTH DAILY. 30 tablet 0  . latanoprost (XALATAN) 0.005 % ophthalmic solution Place 1 drop into both eyes at bedtime. Both eyes     . Meth-Hyo-M Bl-Na Phos-Ph Sal  (URIBEL) 118 MG CAPS Take 1 capsule (118 mg total) by mouth 2 (two) times daily as needed (bladder). 30 capsule 0  . Multiple Vitamins-Minerals (CENTRUM SILVER 50+MEN) TABS Take 1 tablet by mouth daily. Daily     . polyethylene glycol powder (GLYCOLAX/MIRALAX) powder Take 17 g by mouth daily. (Patient taking differently: Take 17 g by mouth daily as needed for mild constipation. ) 3350 g 1  . furosemide (LASIX) 20 MG tablet Take 1 tablet (20 mg total) by mouth daily. May take an additional 20 mg in the afternoon AS NEEDED 90 tablet 0  . metolazone (ZAROXOLYN) 2.5 MG tablet Take 1 tablet today before your extra lasix pill this afternoon (Patient not taking: Reported on 09/09/2016) 1 tablet 0   No current facility-administered medications for this visit.     Discontinued Meds:   There are no discontinued medications.  Patient Active Problem List   Diagnosis Date Noted  . Generalized anxiety disorder 04/05/2016  . Pigmented skin lesion 04/05/2016  . Acute CHF (congestive heart failure) ([place]) 03/15/2016  . Tricuspid regurgitation 03/15/2016  . Pulmonary  hypertension 10/01/2015  . Fatigue 10/01/2015  . Chronic back pain 10/01/2015  . Myelodysplasia 06/20/2015  . Dyspnea 02/09/2015  . Recurrent falls 08/01/2014  . Atrial fibrillation, persistent ([place]) 02/22/2014  . Bilateral leg weakness 05/01/2013  . Abnormal gait 06/12/2012  . GERD (gastroesophageal reflux disease) 05/09/2012  . Alzheimer's dementia 11/17/2011  . GEN OSTEOARTHROSIS INVOLVING MULTIPLE SITES 11/25/2009  . Central hearing loss 11/10/2009  . Biventricular cardiac pacemaker in situ 07/22/2009  . INTERSTITIAL CYSTITIS 07/14/2009  . Pancytopenia ([place]) 12/31/2008  . MITRAL REGURGITATION, MODERATE 12/31/2008  . Chronic systolic heart failure ([place]) 12/31/2008  . ERECTILE DYSFUNCTION 01/31/2008  . Glaucoma 01/31/2008  . Essential hypertension 01/31/2008    LABS    Component Value Date/Time   NA 141 08/19/2016 1608   NA  143 07/21/2016 1244   NA 139 07/01/2016 1620   K 3.9 08/19/2016 1608   K 4.0 07/21/2016 1244   K 3.6 07/01/2016 1620   CL 107 08/19/2016 1608   CL 108 07/21/2016 1244   CL 105 07/01/2016 1620   CO2 28 08/19/2016 1608   CO2 27 07/21/2016 1244   CO2 29 07/01/2016 1620   GLUCOSE 98 08/19/2016 1608   GLUCOSE 96 07/21/2016 1244   GLUCOSE 92 07/01/2016 1620   BUN 24 (H) 08/19/2016 1608   BUN 28 (H) 07/21/2016 1244   BUN 24 (H) 07/01/2016 1620   CREATININE 1.29 (H) 08/19/2016 1608   CREATININE 1.12 (H) 07/21/2016 1244   CREATININE 0.98 07/01/2016 1620   CREATININE 0.94 06/29/2016 0325   CREATININE 1.08 05/10/2016 1156   CREATININE 1.01 04/05/2016 1421   CALCIUM 8.7 (L) 08/19/2016 1608   CALCIUM 8.7 07/21/2016 1244   CALCIUM 8.6 (L) 07/01/2016 1620   GFRNONAA 48 (L) 08/19/2016 1608   GFRNONAA >60 07/01/2016 1620   GFRNONAA >60 06/29/2016 0325   GFRAA 56 (L) 08/19/2016 1608   GFRAA >60 07/01/2016 1620   GFRAA >60 06/29/2016 0325   CMP     Component Value Date/Time   NA 141 08/19/2016 1608   K 3.9 08/19/2016 1608   CL 107 08/19/2016 1608   CO2 28 08/19/2016 1608   GLUCOSE 98 08/19/2016 1608   BUN 24 (H) 08/19/2016 1608   CREATININE 1.29 (H) 08/19/2016 1608   CREATININE 1.12 (H) 07/21/2016 1244   CALCIUM 8.7 (L) 08/19/2016 1608   PROT 6.5 02/14/2016 1542   ALBUMIN 3.5 02/14/2016 1542   AST 26 02/14/2016 1542   ALT 18 02/14/2016 1542   ALKPHOS 58 02/14/2016 1542   BILITOT 2.5 (H) 02/14/2016 1542   GFRNONAA 48 (L) 08/19/2016 1608   GFRAA 56 (L) 08/19/2016 1608       Component Value Date/Time   WBC 2.6 (L) 08/19/2016 1608   WBC 2.4 (L) 06/28/2016 1327   WBC 2.8 (L) 05/20/2016 1656   HGB 9.7 (L) 08/19/2016 1608   HGB 9.9 (L) 06/28/2016 1327   HGB 10.3 (L) 05/20/2016 1656   HCT 30.2 (L) 08/19/2016 1608   HCT 30.8 (L) 06/28/2016 1327   HCT 31.4 (L) 05/20/2016 1656   MCV 101.0 (H) 08/19/2016 1608   MCV 100.3 (H) 06/28/2016 1327   MCV 100.0 05/20/2016 1656    Lipid  Panel     Component Value Date/Time   CHOL 179 06/19/2013 1241   TRIG 66 06/19/2013 1241   HDL 51 06/19/2013 1241   CHOLHDL 3.5 06/19/2013 1241   VLDL 13 06/19/2013 1241   LDLCALC 115 (H) 06/19/2013 1241    ABG  Component Value Date/Time   TCO2 26 03/01/2016 1952     Lab Results  Component Value Date   TSH 4.23 05/10/2016   BNP (last 3 results)  Recent Labs  05/10/16 1156 06/28/16 1327 08/19/16 1549  BNP 1,122.3* 723.0* 1,250.0*    ProBNP (last 3 results) No results for input(s): PROBNP in the last 8760 hours.  Cardiac Panel (last 3 results) No results for input(s): CKTOTAL, CKMB, TROPONINI, RELINDX in the last 72 hours.  Iron/TIBC/Ferritin/ %Sat    Component Value Date/Time   IRON 78 12/29/2015 1313   TIBC 308 12/29/2015 1313   FERRITIN 60 12/29/2015 1313   IRONPCTSAT 25 12/29/2015 1313     EKG Orders placed or performed during the hospital encounter of 06/28/16  . ED EKG within 10 minutes  . ED EKG within 10 minutes  . EKG 12-Lead  . EKG 12-Lead  . EKG     Prior Assessment and Plan Problem List as of 09/09/2016 Reviewed: 09/02/2016  3:27 PM by Vanetta Mulders, LPN     Cardiovascular and Mediastinum   MITRAL REGURGITATION, MODERATE   Last Assessment & Plan 08/08/2014 Office Visit Written 08/08/2014  4:06 PM by Lendon Colonel, NP    No apparent for repair or surgery at this time. Multiple comorbidities. Continue medical management.      Essential hypertension   Last Assessment & Plan 05/10/2016 Office Visit Written 05/16/2016  9:57 PM by Fayrene Helper, MD    Controlled, no change in medication       Chronic systolic heart failure [facility])   Last Assessment & Plan 02/20/2016 Office Visit Written 02/20/2016  3:36 PM by Fayrene Helper, MD    Increased shortness of breath  noted in pt with established heart failure and possible nocturnal wheezuing, needs overnight pulse oximetry, will refer      Atrial fibrillation, persistent [facility])    Last Assessment & Plan 10/01/2015 Office Visit Written 10/01/2015  3:45 PM by Erlene Quan, PA-C    Unable to tolerate anticoagulation secondary to GI bleeding CHADs VASc=3      Pulmonary hypertension   Last Assessment & Plan 02/20/2016 Office Visit Written 02/20/2016  3:43 PM by Fayrene Helper, MD    Shortness of breath and wheezing noted a at night increasingly by spouse, refer for overnight pulse oximetry      Acute CHF (congestive heart failure) [facility])   Last Assessment & Plan 05/10/2016 Office Visit Written 05/16/2016  9:56 PM by Fayrene Helper, MD    Acute flare of symptoms this past weekend, reviewed with pt and spouse the importance of taking medication as prescribed,  Still not consistently taking his diuretic Will check BNP today based on symptom severity, reports he "felt like I was leaving here" and wife states she also gave him a NTG May need sooner cardiology appointment, will follow through      Tricuspid regurgitation     Digestive   GERD (gastroesophageal reflux disease)   Last Assessment & Plan 05/10/2016 Office Visit Written 05/16/2016 10:01 PM by Fayrene Helper, MD    Controlled, no change in medication         Nervous and Auditory   Central hearing loss   Last Assessment & Plan 12/28/2013 Office Visit Written 12/29/2013  9:11 AM by Fayrene Helper, MD    needs hearing aids, will advise checking belltone  For help with this      Alzheimer's dementia   Last Assessment &  Plan 05/10/2016 Office Visit Written 05/16/2016  9:59 PM by Fayrene Helper, MD    Stable off of medication      Bilateral leg weakness   Last Assessment & Plan 09/09/2014 Office Visit Written 02/02/2015 10:59 AM by Fayrene Helper, MD    Improved with recent in house therapy, he is to continue out pt in home PT/OT  For next approx 4 weeks      Myelodysplasia   Last Assessment & Plan 11/03/2015 Office Visit Edited 11/03/2015  3:28 PM by Baird Cancer, PA-C    Pancytopenia in the  setting of a bone marrow aspiration and biopsy on 0000000 by Dr. Sterling Big at [facility] demonstrating MDS with 22q deletion, normocellular marrow at 25% without evidence of melanoma, lymphoma, or morphologic evidence of MDS.  However, decreased iron stores were noted.  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  In November, his ferritin was less than 100 and his calculated iron deficit was approximately 370 mg.  He was therefore given ferric gluconate 125 mg x 2.   Oncology Flowsheet 09/08/2015 09/15/2015  ferric gluconate (NULECIT) IV 125 mg 125 mg    At this time, there is no need for ESA therapy.  However, if he anemia progresses, we could absolutely consider ESA treatment.  His HGB is stable without any significant change.  Leukopenia is noted.  No recent infections or hospitalizations associated with infections.  If this becomes an issue GSF support can be considered.  Not necessary now.  Stable.  Neutrophil count is stable without any major changes.  Re-dressing of right upper arm completed by myself and nursing.  Cutting of scab from Band-Aid was completed.  New dressing placed and recommended he keep dressing in place for 48 hours.  Labs in 8 weeks: CBC diff, CMET, iron/TIBC, ferritin.  Return in 8 weeks for follow-up.  Chart is reviewed.  I agree with SNF placement and will defer to primary care provider.        Musculoskeletal and Integument   GEN OSTEOARTHROSIS INVOLVING MULTIPLE SITES   Last Assessment & Plan 06/12/2015 Office Visit Written 07/09/2015  8:31 PM by Fayrene Helper, MD    Continue current medication      Pigmented skin lesion   Last Assessment & Plan 04/05/2016 Office Visit Written 04/10/2016 10:40 PM by Fayrene Helper, MD    Hyperpigmented lesion under rigth 4th fingernail, increasing in size, also anterior chest lesion with varied pigmentation. Pt has h/o melanoma in past  Years. Refer to dermatology  for skin evaluation        Genitourinary   INTERSTITIAL CYSTITIS   Last Assessment & Plan 06/12/2015 Office Visit Written 07/09/2015  8:36 PM by Fayrene Helper, MD    Followed by urology has implanted device as well as is on medication for symptoms, still c/o excessive nocturia and wants to stop his diuretic, the importance of maintaining medications as prescribed for heart failure protection is again discussed        Hematopoietic and Hemostatic   Pancytopenia [facility])   Last Assessment & Plan 05/10/2016 Office Visit Written 05/16/2016 10:01 PM by Fayrene Helper, MD    Unchanged, has ahd hematology eval in the past on several occasions        Other   ERECTILE DYSFUNCTION   Last Assessment & Plan 05/10/2011 Office Visit Written 05/10/2011 10:00 AM by Fayrene Helper, MD    Pt  states that the cialis is not working as well, he still has a lot, advised him to discuss further with his urologist      Glaucoma   Last Assessment & Plan 02/25/2015 Office Visit Written 04/04/2015 11:06 PM by Fayrene Helper, MD    Followed by opthalmology and compliant with drops      Biventricular cardiac pacemaker in situ   Last Assessment & Plan 11/12/2015 Office Visit Written 11/17/2015  2:09 PM by Fayrene Helper, MD    Current proposal per spouse is that she learn how to monitor from home, she is overwhelmed ans states she is incapable wants an alternative arrangement, requests [facility] help again      Abnormal gait   Last Assessment & Plan 11/12/2015 Office Visit Written 11/17/2015  2:01 PM by Fayrene Helper, MD    High fall risk and incapable of self care , will refer to [facility] for evaluation for any possible assistance, he lives with his spouse who is also aged and less capable of independent living, however , they both still prefer to live in their own home together at this time      Recurrent falls   Last Assessment & Plan 02/20/2016 Office Visit Written 02/20/2016  3:42 PM by Fayrene Helper, MD     Increased weakness , recurrent falls, spouse states she is now ready to get in home health twice weekly, will ask for [facility] help  To navigate the process      Dyspnea   Last Assessment & Plan 01/06/2015 Office Visit Written 02/09/2015  5:28 PM by Fayrene Helper, MD    Recent increase with worsened  exercise tolerance and recurrent falls, pt sent to [facility] for further eval      Fatigue   Last Assessment & Plan 10/01/2015 Office Visit Edited 10/01/2015  4:01 PM by Erlene Quan, PA-C    His main complaint today is fatigue x 1 week. "just feel run down"      Chronic back pain   Last Assessment & Plan 04/05/2016 Office Visit Edited 04/10/2016 10:36 PM by Fayrene Helper, MD    Reports poor control on current regime, manged by ortho who he sees regularly, and he also is on fentanyl patch. No evidence of uncontrolled pain and discomfort at office visits      Generalized anxiety disorder   Last Assessment & Plan 04/05/2016 Office Visit Written 04/10/2016 10:34 PM by Fayrene Helper, MD    Uncontrolled , requests medication "like his wife's", low dose buspar prescribed          Imaging: No results found.

## 2016-09-09 NOTE — Patient Outreach (Signed)
[facility]) Care Management   09/09/2016  [name] 02/04/1926 [id number]  [name] is an 82 y.o. male  Subjective:  Spouse reports today is pt's birthday.   Pt reports he fell last week,  Stumbled and landed on his back, thinks it is his eyesight, glaucoma right eye,  Spouse reports pt  to see Eye MD next month.    Pt reports pain in lower back today  where he fell,  wearing a back support Wrap.  Spouse reports Hospice called, to  come out next week to discuss Palliative Care.  Spouse reports it is getting too much keeping up with pt, does not want to always take his water pill.  RN CM discussed pt's  MD visit today Brian Flake NP) to which spouse reports forgot, informed pt will be going.  Spouse reports pt's urine has an odor, needs to drink more water.   Objective:   Vitals:   09/09/16 1048  BP: 100/68  Pulse: 62  Resp: (!) 24    ROS  Physical Exam  Constitutional: He is oriented to person, place, and time.  Frail   Cardiovascular: Normal rate, regular rhythm and normal heart sounds.   Respiratory: Effort normal.  Lung sounds diminished posterior lower lobes.   GI: Soft.  Musculoskeletal: He exhibits edema.  +1 edema in right ankle/top of foot, trace edema top of left foot.   Unsteady when trying to weigh.   Neurological: He is alert and oriented to person, place, and time.  Skin: Skin is warm and dry.  Psychiatric: He has a normal mood and affect. His behavior is normal. Thought content normal.    Encounter Medications:   Outpatient Encounter Prescriptions as of 09/09/2016  Medication Sig Note  . acetaminophen (TYLENOL) 650 MG CR tablet Take 650 mg by mouth every 8 (eight) hours as needed for pain.  09/09/2016: As needed.   Marland Kitchen amLODipine (NORVASC) 2.5 MG tablet  09/02/2016: Received from: External Pharmacy  . aspirin EC 81 MG EC tablet Take 1 tablet (81 mg total) by mouth daily. 06/30/2016: Spouse still needs to get.   . busPIRone (BUSPAR) 5  MG tablet TAKE 1 TABLET BY MOUTH EVERY DAY AS NEEDED FOR ANXIETY 09/09/2016: Per spouse, pt takes one daily   . dorzolamide (TRUSOPT) 2 % ophthalmic solution Place 1 drop into both eyes daily. One drop both eyes    . fentaNYL (DURAGESIC - DOSED MCG/HR) 25 MCG/HR patch Place 1 patch (25 mcg total) onto the skin every 3 (three) days.   Marland Kitchen KLOR-CON M20 20 MEQ tablet TAKE 1 TABLET (20 MEQ TOTAL) BY MOUTH DAILY.   Marland Kitchen latanoprost (XALATAN) 0.005 % ophthalmic solution Place 1 drop into both eyes at bedtime. Both eyes    . Meth-Hyo-M Bl-Na Phos-Ph Sal (URIBEL) 118 MG CAPS Take 1 capsule (118 mg total) by mouth 2 (two) times daily as needed (bladder). 09/09/2016: As needed.   . Multiple Vitamins-Minerals (CENTRUM SILVER 50+MEN) TABS Take 1 tablet by mouth daily. Daily    . polyethylene glycol powder (GLYCOLAX/MIRALAX) powder Take 17 g by mouth daily. (Patient taking differently: Take 17 g by mouth daily as needed for mild constipation. ) 09/09/2016: As needed.   . Carboxymethylcellul-Glycerin 0.5-0.9 % SOLN Apply 1 drop to eye daily. One drop both eyes    . diclofenac sodium (VOLTAREN) 1 % GEL Apply 2 g topically daily as needed (pain). Reported on 01/05/2016 08/09/2016: As needed.   . ENSURE (ENSURE) Take 237 mLs by  mouth daily. Pt takes daily  07/12/2016: 1-2 a day   . furosemide (LASIX) 20 MG tablet Take 1 tablet (20 mg total) by mouth daily. May take an additional 20 mg in the afternoon AS NEEDED 09/09/2016: Refilled 09/02/16- taking 20 mg daily   . ibuprofen (ADVIL,MOTRIN) 200 MG tablet Take 400 mg by mouth every 6 (six) hours as needed for moderate pain. 05/12/2016: As needed.   . metolazone (ZAROXOLYN) 2.5 MG tablet Take 1 tablet today before your extra lasix pill this afternoon (Patient not taking: Reported on 09/09/2016)    No facility-administered encounter medications on file as of 09/09/2016.     Functional Status:   In your present state of health, do you have any difficulty performing the following  activities: 06/28/2016 03/15/2016  Hearing? Y N  Vision? N N  Difficulty concentrating or making decisions? N Y  Walking or climbing stairs? Y Y  Dressing or bathing? Y N  Doing errands, shopping? Y N  Using the Toilet? - -  In the past six months, have you accidently leaked urine? - -  Do you have problems with loss of bowel control? - -  Managing your Medications? - -  Managing your Finances? - -  Housekeeping or managing your Housekeeping? - -  Some recent data might be hidden    Fall/Depression Screening:    PHQ 2/9 Scores 07/21/2016 01/05/2016 11/25/2015 04/11/2015 01/14/2015 01/14/2015 04/25/2013  PHQ - 2 Score 0 1 1 0 0 1 0    Assessment:  Frail looking gentleman, sitting in recliner upon arrival.   Spouse present during home  Visit.                 HF- attempt made to weigh pt, unsteady on scale and could not get an accurate weight.                        Lungs diminished posterior lower lobes, other areas clear.   No c/o sob, chest pain.                       +1 edema right ankle/top of foot, trace edema top of left foot.                  Fall risk- recent fall- landed on back (hx of chronic back pain) pain +7 today.                   Plan:   Per spouse, Palliative Care to come out next week- discuss services.             Pt to follow up with Brian Sims NP today, spouse to inform NP of urine               (odor).              RN CM to follow up again next week telephonically, check on pt's status/check on             Outcome of visit from [place].      [facility] CM Care Plan Problem One   Flowsheet Row Most Recent Value  Care Plan Problem One  HF progressing   Role Documenting the Problem One  Care Management [place] for Problem One  Active  THN Long Term Goal (31-90 days)  re established - Spouse would continue to see decrease in leg swelling in the next  31 days   THN Long Term Goal Start Date  08/09/16  THN Long Term Goal Met Date  09/09/16  THN CM  Short Term Goal #2 (0-30 days)  Hydration for pt would be within norm within the next 30 days   THN CM Short Term Goal #2 Start Date  08/09/16  THN CM Short Term Goal #3 (0-30 days)  Palliative care services to f/u with pt, pt/spouse make decision about services within the next 14 days   THN CM Short Term Goal #3 Start Date  09/16/16  Interventions for Short Tern Goal #3  Reviewed with spouse benefit of Palliative Care services, per spouse suppose to come out next week.      [name].   [place] Care Management  [phone]

## 2016-09-15 ENCOUNTER — Ambulatory Visit (INDEPENDENT_AMBULATORY_CARE_PROVIDER_SITE_OTHER): Payer: Medicare Other

## 2016-09-15 ENCOUNTER — Encounter: Payer: Self-pay | Admitting: Orthopaedic Surgery

## 2016-09-15 ENCOUNTER — Ambulatory Visit (INDEPENDENT_AMBULATORY_CARE_PROVIDER_SITE_OTHER): Payer: Medicare Other | Admitting: Orthopaedic Surgery

## 2016-09-15 ENCOUNTER — Ambulatory Visit: Payer: Self-pay | Admitting: Urology

## 2016-09-15 VITALS — BP 121/84 | HR 64 | Temp 97.2°F

## 2016-09-15 DIAGNOSIS — M545 Low back pain, unspecified: Secondary | ICD-10-CM

## 2016-09-15 DIAGNOSIS — I255 Ischemic cardiomyopathy: Secondary | ICD-10-CM

## 2016-09-15 NOTE — Progress Notes (Signed)
Patient [name] [name], male DOB:1925/11/23, 82 y.o. [id number]  Chief Complaint  Patient presents with  . Back Pain    fell last week and has had low back pain since then, knee pain     HPI  [name] is a 82 y.o. male who fell last week and hurt his lower back.  He has had pain in the back since then.  He has no weakness.  He has no bowel or bladder problems or paresthesias.  He is not getting any better. HPI  There is no height or weight on file to calculate BMI.  ROS  Review of Systems  HENT: Positive for congestion.   Eyes: Positive for visual disturbance.  Respiratory: Positive for cough and shortness of breath.   Cardiovascular: Negative for chest pain and leg swelling.  Endocrine: Positive for cold intolerance.  Musculoskeletal: Positive for arthralgias, back pain, gait problem, joint swelling and myalgias.  Allergic/Immunologic: Positive for environmental allergies.    Past Medical History:  Diagnosis Date  . A-fib ([place])   . Alzheimer disease   . Arthritis   . Asymptomatic carotid artery stenosis    BILATERAL MILD ICA---  <50% PER DUPLEX 03-08-2008  . BPH (benign prostatic hypertrophy)   . Cancer ([place])   . Cardiac pacemaker    CARDIOLOGIST-- DR Cristopher Peru (LAST PACE [facility] 05-15-2013)  . CHB (complete heart block) ([place])   . Chronic back pain   . Chronic pancreatitis ([place])    Based on CT findings  . Chronic systolic heart failure ([place])   . Costochondritis, acute   . Dementia   . Erectile dysfunction   . Glaucoma   . History of melanoma in situ    JAN 2012--  LEFT HEEL W/ SLN BX  . History of syncope   . HOH (hard of hearing)   . Hypertension   . IC (interstitial cystitis)   . Nonischemic cardiomyopathy ([place])   . Pancytopenia   . Poor historian   . Rash and other nonspecific skin eruption   . Severe mitral regurgitation   . Urge urinary incontinence   . Visual changes     Past Surgical History:  Procedure Laterality Date  . BACK SURGERY  X3   LAST ONE'90's  . CATARACT EXTRACTION, BILATERAL  left  1997/   right 2003  . COLONOSCOPY  08/18/2006   [id number] granularity and erosions of the rectum of uncertain significance biopsied.  A long redundant, but otherwise normal-appearing colon.melanosi coli and minimal inflammation on bx  . COLONOSCOPY N/A 08/05/2014   Procedure: COLONOSCOPY;  Surgeon: [name], MD;  [facility];  Service: Endoscopy;  Laterality: N/A;  PT NEEDS TCS AT 1000.  Marland Kitchen CYSTO/ HOD/  REPLACEMENT INTERSTIM IMPLANT  05-14-2003  . CYSTOSCOPY W/ RETROGRADES N/A 02/11/2016   Procedure: CYSTOSCOPY WITH RETROGRADE PYELOGRAM;  Surgeon: Cleon Gustin, MD;  Location: [facility] ORS;  Service: Urology;  Laterality: N/A;  . ESOPHAGOGASTRODUODENOSCOPY N/A 03/28/2014   Dr. [name]: normal esophagus s/p dilation, mosaic appearance - chronic inflammation noted on bx  . IMPLANTABLE CARDIOVERTER DEFIBRILLATOR (ICD) GENERATOR CHANGE N/A 10/11/2014   Procedure: ICD GENERATOR CHANGE;  Surgeon: [name], MD;  Location: [facility] CATH LAB;  Service: Cardiovascular;  Laterality: N/A;  . INGUINAL HERNIA REPAIR Bilateral AS TEEN  . INTERSTIM IMPLANT PLACEMENT  2001  &  2007  . INTERSTIM IMPLANT REVISION N/A 08/28/2013   Procedure: REPLACEMENT OF INTERSTIM-GENERATOR AND LEAD ;  Surgeon: [name], MD;  Location: [place];  Service: Urology;  Laterality: N/A;  . LEFT ELBOW SURGERY  2002  . MALONEY DILATION N/A 03/28/2014   Procedure: Venia Minks DILATION;  Surgeon: [name], MD;  [facility];  Service: Endoscopy;  Laterality: N/A;  . PACEMAKER INSERTION  12-06-2008  DR [name]   BiV PPM  --  [employer]  . REMOVAL INTERSTIM IMPLANT/  CYST/  HOD  04-14-2004  . REVISION INTERSTIM IMPLANT AND REPLACE GENERATOR  05-15-2009   left upper buttock for urge urinary incontinence  . SAVORY DILATION N/A 03/28/2014   Procedure: SAVORY DILATION;  Surgeon: [name], MD;  [facility];  Service:  Endoscopy;  Laterality: N/A;  . TONSILLECTOMY  1950  . TRANSTHORACIC ECHOCARDIOGRAM  12-06-2008   LVSF 55-60%/  MODERATE MR/  MILD AR/  QUESTION DISTAL POSTERIOR HYPOKINESIS  . TRANSURETHRAL RESECTION OF BLADDER TUMOR WITH GYRUS (TURBT-GYRUS) N/A 02/11/2016   Procedure: TRANSURETHRAL RESECTION OF BLADDER TUMOR WITH GYRUS (TURBT-GYRUS);  Surgeon: Cleon Gustin, MD;  [facility];  Service: Urology;  Laterality: N/A;  . WIDE EXCISION LEFT HEEL AND SLN BX  JUNE 2012    Family History  Problem Relation Age of Onset  . Dementia Mother   . Throat cancer Father   . Cancer Father 86    throat  . Lung cancer Sister   . Heart disease Brother     Social History Social History  Substance Use Topics  . Smoking status: Former Smoker    Packs/day: 0.02    Years: 30.00    Types: Cigarettes    Quit date: 08/24/1997  . Smokeless tobacco: Never Used  . Alcohol use No      Current Outpatient Prescriptions  Medication Sig Dispense Refill  . acetaminophen (TYLENOL) 650 MG CR tablet Take 650 mg by mouth every 8 (eight) hours as needed for pain.     Marland Kitchen amLODipine (NORVASC) 2.5 MG tablet     . aspirin EC 81 MG EC tablet Take 1 tablet (81 mg total) by mouth daily.    . busPIRone (BUSPAR) 5 MG tablet TAKE 1 TABLET BY MOUTH EVERY DAY AS NEEDED FOR ANXIETY 90 tablet 1  . Carboxymethylcellul-Glycerin 0.5-0.9 % SOLN Apply 1 drop to eye daily. One drop both eyes     . diclofenac sodium (VOLTAREN) 1 % GEL Apply 2 g topically daily as needed (pain). Reported on 01/05/2016    . dorzolamide (TRUSOPT) 2 % ophthalmic solution Place 1 drop into both eyes daily. One drop both eyes     . ENSURE (ENSURE) Take 237 mLs by mouth daily. Pt takes daily     . fentaNYL (DURAGESIC - DOSED MCG/HR) 25 MCG/HR patch Place 1 patch (25 mcg total) onto the skin every 3 (three) days. 10 patch 0  . furosemide (LASIX) 20 MG tablet Take 1 tablet (20 mg total) by mouth daily. May take an additional 20 mg in the afternoon AS  NEEDED 90 tablet 0  . ibuprofen (ADVIL,MOTRIN) 200 MG tablet Take 400 mg by mouth every 6 (six) hours as needed for moderate pain.    Marland Kitchen KLOR-CON M20 20 MEQ tablet TAKE 1 TABLET (20 MEQ TOTAL) BY MOUTH DAILY. 30 tablet 0  . latanoprost (XALATAN) 0.005 % ophthalmic solution Place 1 drop into both eyes at bedtime. Both eyes     . Meth-Hyo-M Bl-Na Phos-Ph Sal (URIBEL) 118 MG CAPS Take 1 capsule (118 mg total) by mouth 2 (two) times daily as needed (bladder).  30 capsule 0  . metolazone (ZAROXOLYN) 2.5 MG tablet Take 1 tablet today before your extra lasix pill this afternoon (Patient not taking: Reported on 09/09/2016) 1 tablet 0  . Multiple Vitamins-Minerals (CENTRUM SILVER 50+MEN) TABS Take 1 tablet by mouth daily. Daily     . polyethylene glycol powder (GLYCOLAX/MIRALAX) powder Take 17 g by mouth daily. (Patient taking differently: Take 17 g by mouth daily as needed for mild constipation. ) 3350 g 1   No current facility-administered medications for this visit.      Physical Exam  Blood pressure 121/84, pulse 64, temperature 97.2 F (36.2 C).  Constitutional: overall normal hygiene, normal nutrition, well developed, normal grooming, normal body habitus. Assistive device:wheelchair  Musculoskeletal: gait and station Limp difficult to stand secondary to pain, muscle tone and strength are normal, no tremors or atrophy is present.  .  Neurological: coordination overall normal.  Deep tendon reflex/nerve stretch intact.  Sensation normal.  Cranial nerves II-XII intact.   Skin:   Normal overall no scars, lesions, ulcers or rashes. No psoriasis.  Psychiatric: Alert and oriented x 3.  Recent memory intact, remote memory unclear.  Normal mood and affect. Well groomed.  Good eye contact.  Cardiovascular: overall no swelling, no varicosities, no edema bilaterally, normal temperatures of the legs and arms, no clubbing, cyanosis and good capillary refill.  Lymphatic: palpation is normal.  He has  diffuse pain of the lower back more at T12 through L4 area.  He has no spasm.  He cannot stand secondary to pain.  NV is intact.  The patient has been educated about the nature of the problem(s) and counseled on treatment options.  The patient appeared to understand what I have discussed and is in agreement with it.  Encounter Diagnosis  Name Primary?  . Acute midline low back pain without sciatica Yes    PLAN Call if any problems.  Precautions discussed.  Continue current medications.   Return to clinic get bone scan tomorrow.  Return next week.   Electronically Signed Sanjuana Kava, MD 12/13/20172:31 PM

## 2016-09-16 ENCOUNTER — Encounter (HOSPITAL_COMMUNITY)
Admission: RE | Admit: 2016-09-16 | Discharge: 2016-09-16 | Disposition: A | Discharge status: Home / Self Care | Payer: Medicare Other | Source: Ambulatory Visit | Attending: Orthopaedic Surgery | Admitting: Orthopaedic Surgery

## 2016-09-16 ENCOUNTER — Ambulatory Visit (HOSPITAL_COMMUNITY)
Admission: RE | Admit: 2016-09-16 | Discharge: 2016-09-16 | Disposition: A | Discharge status: Home / Self Care | Payer: Medicare Other | Source: Ambulatory Visit | Attending: Adult Health | Admitting: Adult Health

## 2016-09-16 ENCOUNTER — Encounter (HOSPITAL_COMMUNITY): Payer: Self-pay

## 2016-09-16 DIAGNOSIS — M545 Low back pain, unspecified: Secondary | ICD-10-CM

## 2016-09-16 DIAGNOSIS — I5022 Chronic systolic (congestive) heart failure: Secondary | ICD-10-CM | POA: Diagnosis not present

## 2016-09-16 DIAGNOSIS — I509 Heart failure, unspecified: Secondary | ICD-10-CM | POA: Diagnosis not present

## 2016-09-16 DIAGNOSIS — I700 Atherosclerosis of aorta: Secondary | ICD-10-CM | POA: Diagnosis not present

## 2016-09-16 DIAGNOSIS — I10 Essential (primary) hypertension: Secondary | ICD-10-CM | POA: Insufficient documentation

## 2016-09-16 DIAGNOSIS — G9529 Other cord compression: Secondary | ICD-10-CM | POA: Diagnosis not present

## 2016-09-16 DIAGNOSIS — M549 Dorsalgia, unspecified: Secondary | ICD-10-CM | POA: Diagnosis not present

## 2016-09-16 MED ORDER — TECHNETIUM TC 99M MEDRONATE IV KIT
20.0000 | PACK | Freq: Once | INTRAVENOUS | Status: AC | PRN
  Administered 2016-09-16: 21 via INTRAVENOUS

## 2016-09-17 ENCOUNTER — Other Ambulatory Visit: Payer: Self-pay

## 2016-09-17 MED ORDER — FENTANYL 25 MCG/HR TD PT72: 25.0000 ug | MEDICATED_PATCH | TRANSDERMAL | 0 refills | Status: DC

## 2016-09-20 ENCOUNTER — Other Ambulatory Visit: Payer: Self-pay | Admitting: Family Medicine

## 2016-09-20 ENCOUNTER — Ambulatory Visit: Payer: Self-pay | Admitting: Adult Health

## 2016-09-20 ENCOUNTER — Telehealth: Payer: Self-pay | Admitting: Family Medicine

## 2016-09-20 NOTE — Telephone Encounter (Signed)
Narc history reviewed  Vis reporting, not using fentanyl consistently as prescribed, risk outweighs benefit, this is being discontinued

## 2016-09-20 NOTE — Progress Notes (Deleted)
Cardiology Office Note   Date:  09/20/2016   ID:  [name], DOB 10/27/1925, MRN [id number]  PCP:  [name], MD  Cardiologist:  Jory Sims, NP   No chief complaint on file.     History of Present Illness: [name] is a 82 y.o. male who presents for ***    Past Medical History:  Diagnosis Date  . A-fib ([place])   . Alzheimer disease   . Arthritis   . Asymptomatic carotid artery stenosis    BILATERAL MILD ICA---  <50% PER DUPLEX 03-08-2008  . BPH (benign prostatic hypertrophy)   . Cancer ([place])   . Cardiac pacemaker    CARDIOLOGIST-- DR Cristopher Peru (LAST PACE [facility] 05-15-2013)  . CHB (complete heart block) ([place])   . Chronic back pain   . Chronic pancreatitis ([place])    Based on CT findings  . Chronic systolic heart failure ([place])   . Costochondritis, acute   . Dementia   . Erectile dysfunction   . Glaucoma   . History of melanoma in situ    JAN 2012--  LEFT HEEL W/ SLN BX  . History of syncope   . HOH (hard of hearing)   . Hypertension   . IC (interstitial cystitis)   . Nonischemic cardiomyopathy ([place])   . Pancytopenia   . Poor historian   . Rash and other nonspecific skin eruption   . Severe mitral regurgitation   . Urge urinary incontinence   . Visual changes     Past Surgical History:  Procedure Laterality Date  . BACK SURGERY  X3  LAST ONE'90's  . CATARACT EXTRACTION, BILATERAL  left  1997/   right 2003  . COLONOSCOPY  08/18/2006   [id number] granularity and erosions of the rectum of uncertain significance biopsied.  A long redundant, but otherwise normal-appearing colon.melanosi coli and minimal inflammation on bx  . COLONOSCOPY N/A 08/05/2014   Procedure: COLONOSCOPY;  Surgeon: [name], MD;  [facility];  Service: Endoscopy;  Laterality: N/A;  PT NEEDS TCS AT 1000.  Marland Kitchen CYSTO/ HOD/  REPLACEMENT INTERSTIM IMPLANT  05-14-2003  . CYSTOSCOPY W/ RETROGRADES N/A 02/11/2016   Procedure: CYSTOSCOPY WITH RETROGRADE PYELOGRAM;   Surgeon: Cleon Gustin, MD;  Location: [facility];  Service: Urology;  Laterality: N/A;  . ESOPHAGOGASTRODUODENOSCOPY N/A 03/28/2014   Dr. [name]: normal esophagus s/p dilation, mosaic appearance - chronic inflammation noted on bx  . IMPLANTABLE CARDIOVERTER DEFIBRILLATOR (ICD) GENERATOR CHANGE N/A 10/11/2014   Procedure: ICD GENERATOR CHANGE;  Surgeon: [name], MD;  Location: [facility] CATH LAB;  Service: Cardiovascular;  Laterality: N/A;  . INGUINAL HERNIA REPAIR Bilateral AS TEEN  . INTERSTIM IMPLANT PLACEMENT  2001  &  2007  . INTERSTIM IMPLANT REVISION N/A 08/28/2013   Procedure: REPLACEMENT OF INTERSTIM-GENERATOR AND LEAD ;  Surgeon: [name], MD;  Location: [place];  Service: Urology;  Laterality: N/A;  . LEFT ELBOW SURGERY  2002  . MALONEY DILATION N/A 03/28/2014   Procedure: Venia Minks DILATION;  Surgeon: [name], MD;  [facility];  Service: Endoscopy;  Laterality: N/A;  . PACEMAKER INSERTION  12-06-2008  DR [name]   BiV PPM  --  [employer]  . REMOVAL INTERSTIM IMPLANT/  CYST/  HOD  04-14-2004  . REVISION INTERSTIM IMPLANT AND REPLACE GENERATOR  05-15-2009   left upper buttock for urge urinary incontinence  . SAVORY DILATION N/A 03/28/2014   Procedure: SAVORY DILATION;  Surgeon: Cristopher Estimable  Rourk, MD;  [facility];  Service: Endoscopy;  Laterality: N/A;  . TONSILLECTOMY  1950  . TRANSTHORACIC ECHOCARDIOGRAM  12-06-2008   LVSF 55-60%/  MODERATE MR/  MILD AR/  QUESTION DISTAL POSTERIOR HYPOKINESIS  . TRANSURETHRAL RESECTION OF BLADDER TUMOR WITH GYRUS (TURBT-GYRUS) N/A 02/11/2016   Procedure: TRANSURETHRAL RESECTION OF BLADDER TUMOR WITH GYRUS (TURBT-GYRUS);  Surgeon: Cleon Gustin, MD;  Location: [facility];  Service: Urology;  Laterality: N/A;  . WIDE EXCISION LEFT HEEL AND SLN BX  JUNE 2012     Current Outpatient Prescriptions  Medication Sig Dispense Refill  . acetaminophen (TYLENOL) 650 MG CR tablet Take 650 mg by mouth  every 8 (eight) hours as needed for pain.     Marland Kitchen amLODipine (NORVASC) 2.5 MG tablet     . aspirin EC 81 MG EC tablet Take 1 tablet (81 mg total) by mouth daily.    . busPIRone (BUSPAR) 5 MG tablet TAKE 1 TABLET BY MOUTH EVERY DAY AS NEEDED FOR ANXIETY 90 tablet 1  . Carboxymethylcellul-Glycerin 0.5-0.9 % SOLN Apply 1 drop to eye daily. One drop both eyes     . diclofenac sodium (VOLTAREN) 1 % GEL Apply 2 g topically daily as needed (pain). Reported on 01/05/2016    . dorzolamide (TRUSOPT) 2 % ophthalmic solution Place 1 drop into both eyes daily. One drop both eyes     . ENSURE (ENSURE) Take 237 mLs by mouth daily. Pt takes daily     . fentaNYL (DURAGESIC - DOSED MCG/HR) 25 MCG/HR patch Place 1 patch (25 mcg total) onto the skin every 3 (three) days. 10 patch 0  . furosemide (LASIX) 20 MG tablet Take 1 tablet (20 mg total) by mouth daily. May take an additional 20 mg in the afternoon AS NEEDED 90 tablet 0  . ibuprofen (ADVIL,MOTRIN) 200 MG tablet Take 400 mg by mouth every 6 (six) hours as needed for moderate pain.    Marland Kitchen KLOR-CON M20 20 MEQ tablet TAKE 1 TABLET (20 MEQ TOTAL) BY MOUTH DAILY. 30 tablet 0  . latanoprost (XALATAN) 0.005 % ophthalmic solution Place 1 drop into both eyes at bedtime. Both eyes     . Meth-Hyo-M Bl-Na Phos-Ph Sal (URIBEL) 118 MG CAPS Take 1 capsule (118 mg total) by mouth 2 (two) times daily as needed (bladder). 30 capsule 0  . metolazone (ZAROXOLYN) 2.5 MG tablet Take 1 tablet today before your extra lasix pill this afternoon (Patient not taking: Reported on 09/09/2016) 1 tablet 0  . Multiple Vitamins-Minerals (CENTRUM SILVER 50+MEN) TABS Take 1 tablet by mouth daily. Daily     . polyethylene glycol powder (GLYCOLAX/MIRALAX) powder Take 17 g by mouth daily. (Patient taking differently: Take 17 g by mouth daily as needed for mild constipation. ) 3350 g 1   No current facility-administered medications for this visit.     Allergies:   Codeine and Penicillins    Social  History:  The patient  reports that he quit smoking about 19 years ago. His smoking use included Cigarettes. He has a 0.60 pack-year smoking history. He has never used smokeless tobacco. He reports that he does not drink alcohol or use drugs.   Family History:  The patient's family history includes Cancer (age of onset: 15) in his father; Dementia in his mother; Heart disease in his brother; Lung cancer in his sister; Throat cancer in his father.    ROS: All other systems are reviewed and negative. Unless otherwise mentioned in H&P  PHYSICAL EXAM: VS:  There were no vitals taken for this visit. , BMI There is no height or weight on file to calculate BMI. GEN: Well nourished, well developed, in no acute distress HEENT: normal Neck: no JVD, carotid bruits, or masses Cardiac: ***RRR; no murmurs, rubs, or gallops,no edema  Respiratory:  clear to auscultation bilaterally, normal work of breathing GI: soft, nontender, nondistended, + BS MS: no deformity or atrophy Skin: warm and dry, no rash Neuro:  Strength and sensation are intact Psych: euthymic mood, full affect   EKG:  EKG {ACTION; IS/IS [id number] ordered today. The ekg ordered today demonstrates ***   Recent Labs: 02/14/2016: ALT 18 05/10/2016: TSH 4.23 08/19/2016: B Natriuretic Peptide 1,250.0; BUN 24; Creatinine, Ser 1.29; Hemoglobin 9.7; Platelets 84; Potassium 3.9; Sodium 141    Lipid Panel    Component Value Date/Time   CHOL 179 06/19/2013 1241   TRIG 66 06/19/2013 1241   HDL 51 06/19/2013 1241   CHOLHDL 3.5 06/19/2013 1241   VLDL 13 06/19/2013 1241   LDLCALC 115 (H) 06/19/2013 1241      Wt Readings from Last 3 Encounters:  09/09/16 153 lb (69.4 kg)  08/31/16 156 lb (70.8 kg)  08/19/16 163 lb (73.9 kg)      Other studies Reviewed: Additional studies/ records that were reviewed today include: ***. Review of the above records demonstrates: ***   ASSESSMENT AND PLAN:  1.  ***   Current medicines are  reviewed at length with the patient today.    Labs/ tests ordered today include: *** No orders of the defined types were placed in this encounter.    Disposition:   FU with *** in {gen number [id number] {TIME; UNITS DAY/WEEK/MONTH:19136}   Signed, Jory Sims, NP  09/20/2016 7:51 AM    [facility] [address] [place], [place] [address] Phone: [phone]; Fax: [phone]

## 2016-09-21 NOTE — Telephone Encounter (Signed)
noted 

## 2016-09-22 ENCOUNTER — Other Ambulatory Visit: Payer: Self-pay | Admitting: *Deleted

## 2016-09-22 ENCOUNTER — Encounter: Payer: Self-pay | Admitting: Orthopaedic Surgery

## 2016-09-22 ENCOUNTER — Encounter: Payer: Self-pay | Admitting: *Deleted

## 2016-09-22 ENCOUNTER — Ambulatory Visit (INDEPENDENT_AMBULATORY_CARE_PROVIDER_SITE_OTHER): Payer: Medicare Other | Admitting: Orthopaedic Surgery

## 2016-09-22 VITALS — BP 119/83 | HR 64 | Temp 97.3°F | Ht 67.0 in | Wt 157.0 lb

## 2016-09-22 DIAGNOSIS — I255 Ischemic cardiomyopathy: Secondary | ICD-10-CM

## 2016-09-22 DIAGNOSIS — S2231XA Fracture of one rib, right side, initial encounter for closed fracture: Secondary | ICD-10-CM | POA: Diagnosis not present

## 2016-09-22 NOTE — Patient Outreach (Signed)
Phone call completed, follow up on pt's status, check on outcome of Palliative care services.   Spoke with spouse, HIPAA/identity verified on pt.  Spouse reports pt had a recent fall last week- getting up, pain so bad in back that he went to see [facility] MD- Dr. Luna Glasgow.  Spouse reports MD did a xray of pt's chest,hip,back and pt to follow up today for results.  Spouse reports pt has been taking OTC Acetaminophen Arthritis for the pain.  Spouse reports pt's swelling is not as bad, still giving him Lasix every day, thinks pt is slipping and eating salt.  As discussed, spouse knows to give pt extra Lasix as needed, call MD if swelling does not decrease.  Spouse reports Palliative Care came 12/18, was told pt makes too much money, services not free, did not pass.  Spouse reports she does have help with pt, someone here now.   RN CM discussed with spouse plan to discharge pt from community nurse case management services, no further case management needs.     Plan:  As discussed with spouse, plan to close case- goals met.           As discussed, case closure letter to be sent to pt, includes [facility] main contact number to call if needs             Arise in the future.          Plan to send Dr. Moshe Cipro case closure letter.   [name].   [facility]  [phone]

## 2016-09-22 NOTE — Progress Notes (Signed)
Patient [name], male DOB:[birth year], 82 y.o. [id number]  Chief Complaint  Patient presents with  . Follow-up    review bone scan results    HPI  [name] is a 82 y.o. male who has had increased back pain recently.  He had bone scan done and it shows a right posterior rib fracture at level 8.  I have told him and his wife the findings.  Tylenol or Advil should be given for pain.  He has no shortness of breath or cough. HPI  Body mass index is 24.59 kg/m.  ROS  Review of Systems  HENT: Positive for congestion.   Eyes: Positive for visual disturbance.  Respiratory: Positive for cough and shortness of breath.   Cardiovascular: Negative for chest pain and leg swelling.  Endocrine: Positive for cold intolerance.  Musculoskeletal: Positive for arthralgias, back pain, gait problem, joint swelling and myalgias.  Allergic/Immunologic: Positive for environmental allergies.    Past Medical History:  Diagnosis Date  . A-fib ([place])   . Alzheimer disease   . Arthritis   . Asymptomatic carotid artery stenosis    BILATERAL MILD ICA---  <50% PER DUPLEX 03-08-2008  . BPH (benign prostatic hypertrophy)   . Cancer ([place])   . Cardiac pacemaker    CARDIOLOGIST-- DR Cristopher Peru (LAST PACE [facility] 05-15-2013)  . CHB (complete heart block) ([place])   . Chronic back pain   . Chronic pancreatitis ([place])    Based on CT findings  . Chronic systolic heart failure ([place])   . Costochondritis, acute   . Dementia   . Erectile dysfunction   . Glaucoma   . History of melanoma in situ    JAN 2012--  LEFT HEEL W/ SLN BX  . History of syncope   . HOH (hard of hearing)   . Hypertension   . IC (interstitial cystitis)   . Nonischemic cardiomyopathy ([place])   . Pancytopenia   . Poor historian   . Rash and other nonspecific skin eruption   . Severe mitral regurgitation   . Urge urinary incontinence   . Visual changes     Past Surgical History:  Procedure Laterality Date  . BACK SURGERY  X3  LAST  ONE'90's  . CATARACT EXTRACTION, BILATERAL  left  1997/   right 2003  . COLONOSCOPY  08/18/2006   [id number] granularity and erosions of the rectum of uncertain significance biopsied.  A long redundant, but otherwise normal-appearing colon.melanosi coli and minimal inflammation on bx  . COLONOSCOPY N/A 08/05/2014   Procedure: COLONOSCOPY;  Surgeon: [name], [name];  [facility];  Service: Endoscopy;  Laterality: N/A;  PT NEEDS TCS AT 1000.  Marland Kitchen CYSTO/ HOD/  REPLACEMENT INTERSTIM IMPLANT  05-14-2003  . CYSTOSCOPY W/ RETROGRADES N/A 02/11/2016   Procedure: CYSTOSCOPY WITH RETROGRADE PYELOGRAM;  Surgeon: Cleon Gustin, [name];  Location: [facility] ORS;  Service: Urology;  Laterality: N/A;  . ESOPHAGOGASTRODUODENOSCOPY N/A 03/28/2014   Dr. [name]: normal esophagus s/p dilation, mosaic appearance - chronic inflammation noted on bx  . IMPLANTABLE CARDIOVERTER DEFIBRILLATOR (ICD) GENERATOR CHANGE N/A 10/11/2014   Procedure: ICD GENERATOR CHANGE;  Surgeon: [name], [name];  Location: [facility] CATH LAB;  Service: Cardiovascular;  Laterality: N/A;  . INGUINAL HERNIA REPAIR Bilateral AS TEEN  . INTERSTIM IMPLANT PLACEMENT  2001  &  2007  . INTERSTIM IMPLANT REVISION N/A 08/28/2013   Procedure: REPLACEMENT OF INTERSTIM-GENERATOR AND LEAD ;  Surgeon: [name], [name];  Location: [facility];  Service:  Urology;  Laterality: N/A;  . LEFT ELBOW SURGERY  2002  . MALONEY DILATION N/A 03/28/2014   Procedure: Venia Minks DILATION;  Surgeon: [name], [name];  [facility];  Service: Endoscopy;  Laterality: N/A;  . PACEMAKER INSERTION  12-06-2008  DR [name]   BiV PPM  --  [employer]  . REMOVAL INTERSTIM IMPLANT/  CYST/  HOD  04-14-2004  . REVISION INTERSTIM IMPLANT AND REPLACE GENERATOR  05-15-2009   left upper buttock for urge urinary incontinence  . SAVORY DILATION N/A 03/28/2014   Procedure: SAVORY DILATION;  Surgeon: [name], [name];  [facility];  Service:  Endoscopy;  Laterality: N/A;  . TONSILLECTOMY  1950  . TRANSTHORACIC ECHOCARDIOGRAM  12-06-2008   LVSF 55-60%/  MODERATE MR/  MILD AR/  QUESTION DISTAL POSTERIOR HYPOKINESIS  . TRANSURETHRAL RESECTION OF BLADDER TUMOR WITH GYRUS (TURBT-GYRUS) N/A 02/11/2016   Procedure: TRANSURETHRAL RESECTION OF BLADDER TUMOR WITH GYRUS (TURBT-GYRUS);  Surgeon: Cleon Gustin, [name];  Location: [facility];  Service: Urology;  Laterality: N/A;  . WIDE EXCISION LEFT HEEL AND SLN BX  JUNE 2012    Family History  Problem Relation Age of Onset  . Dementia Mother   . Throat cancer Father   . Cancer Father 86    throat  . Lung cancer Sister   . Heart disease Brother     Social History Social History  Substance Use Topics  . Smoking status: Former Smoker    Packs/day: 0.02    Years: 30.00    Types: Cigarettes    Quit date: 08/24/1997  . Smokeless tobacco: Never Used  . Alcohol use No      Current Outpatient Prescriptions  Medication Sig Dispense Refill  . acetaminophen (TYLENOL) 650 MG CR tablet Take 650 mg by mouth every 8 (eight) hours as needed for pain.     Marland Kitchen amLODipine (NORVASC) 2.5 MG tablet     . aspirin EC 81 MG EC tablet Take 1 tablet (81 mg total) by mouth daily.    . busPIRone (BUSPAR) 5 MG tablet TAKE 1 TABLET BY MOUTH EVERY DAY AS NEEDED FOR ANXIETY 90 tablet 1  . Carboxymethylcellul-Glycerin 0.5-0.9 % SOLN Apply 1 drop to eye daily. One drop both eyes     . diclofenac sodium (VOLTAREN) 1 % GEL Apply 2 g topically daily as needed (pain). Reported on 01/05/2016    . dorzolamide (TRUSOPT) 2 % ophthalmic solution Place 1 drop into both eyes daily. One drop both eyes     . ENSURE (ENSURE) Take 237 mLs by mouth daily. Pt takes daily     . furosemide (LASIX) 20 MG tablet Take 1 tablet (20 mg total) by mouth daily. May take an additional 20 mg in the afternoon AS NEEDED 90 tablet 0  . ibuprofen (ADVIL,MOTRIN) 200 MG tablet Take 400 mg by mouth every 6 (six) hours as needed for moderate pain.     Marland Kitchen KLOR-CON M20 20 MEQ tablet TAKE 1 TABLET (20 MEQ TOTAL) BY MOUTH DAILY. 30 tablet 0  . latanoprost (XALATAN) 0.005 % ophthalmic solution Place 1 drop into both eyes at bedtime. Both eyes     . Meth-Hyo-M Bl-Na Phos-Ph Sal (URIBEL) 118 MG CAPS Take 1 capsule (118 mg total) by mouth 2 (two) times daily as needed (bladder). 30 capsule 0  . metolazone (ZAROXOLYN) 2.5 MG tablet Take 1 tablet today before your extra lasix pill this afternoon (Patient not taking: Reported on 09/09/2016) 1 tablet 0  .  Multiple Vitamins-Minerals (CENTRUM SILVER 50+MEN) TABS Take 1 tablet by mouth daily. Daily     . polyethylene glycol powder (GLYCOLAX/MIRALAX) powder Take 17 g by mouth daily. (Patient taking differently: Take 17 g by mouth daily as needed for mild constipation. ) 3350 g 1   No current facility-administered medications for this visit.      Physical Exam  Blood pressure 119/83, pulse 64, temperature 97.3 F (36.3 C), height 5\' 7"  (1.702 m), weight 157 lb (71.2 kg).  Constitutional: overall normal hygiene, normal nutrition, well developed, normal grooming, normal body habitus. Assistive device:cane  Musculoskeletal: gait and station Limp right, muscle tone and strength are normal, no tremors or atrophy is present.  .  Neurological: coordination overall normal.  Deep tendon reflex/nerve stretch intact.  Sensation normal.  Cranial nerves II-XII intact.   Skin:   Normal overall no scars, lesions, ulcers or rashes. No psoriasis.  Psychiatric: Alert and oriented x 3.  Recent memory intact, remote memory unclear.  Normal mood and affect. Well groomed.  Good eye contact.  Cardiovascular: overall no swelling, no varicosities, no edema bilaterally, normal temperatures of the legs and arms, no clubbing, cyanosis and good capillary refill.  Lymphatic: palpation is normal.  He has tenderness of the thoracic spine mid line more on the right.  He has no bruising.  The patient has been educated about the  nature of the problem(s) and counseled on treatment options.  The patient appeared to understand what I have discussed and is in agreement with it.  Encounter Diagnosis  Name Primary?  . Closed fracture of one rib of right side, initial encounter Yes    PLAN Call if any problems.  Precautions discussed.  Continue current medications.   Return to clinic 1 month   Electronically Signed Sanjuana Kava, [name] 12/20/20172:25 PM

## 2016-10-05 ENCOUNTER — Other Ambulatory Visit (HOSPITAL_COMMUNITY): Payer: Self-pay | Admitting: Oncology

## 2016-10-05 DIAGNOSIS — E876 Hypokalemia: Secondary | ICD-10-CM

## 2016-10-13 ENCOUNTER — Ambulatory Visit: Payer: Self-pay | Admitting: Urology

## 2016-10-14 DIAGNOSIS — H401123 Primary open-angle glaucoma, left eye, severe stage: Secondary | ICD-10-CM | POA: Diagnosis not present

## 2016-10-20 ENCOUNTER — Ambulatory Visit: Payer: Self-pay | Admitting: Orthopaedic Surgery

## 2016-10-21 ENCOUNTER — Ambulatory Visit: Payer: Self-pay | Admitting: Family Medicine

## 2016-10-27 ENCOUNTER — Encounter: Payer: Self-pay | Admitting: Orthopaedic Surgery

## 2016-10-27 ENCOUNTER — Other Ambulatory Visit (HOSPITAL_COMMUNITY)
Admission: AD | Admit: 2016-10-27 | Discharge: 2016-10-27 | Disposition: A | Discharge status: Home / Self Care | Payer: Medicare Other | Source: Other Acute Inpatient Hospital | Attending: Urology | Admitting: Urology

## 2016-10-27 ENCOUNTER — Ambulatory Visit: Payer: Self-pay | Admitting: Orthopaedic Surgery

## 2016-10-27 DIAGNOSIS — R300 Dysuria: Secondary | ICD-10-CM | POA: Insufficient documentation

## 2016-10-27 LAB — URINALYSIS, COMPLETE (UACMP) WITH MICROSCOPIC
Glucose, UA: NEGATIVE mg/dL
Hgb urine dipstick: NEGATIVE
Ketones, ur: NEGATIVE mg/dL
NITRITE: NEGATIVE
SPECIFIC GRAVITY, URINE: 1.015 (ref 1.005–1.030)
pH: 6 (ref 5.0–8.0)

## 2016-10-31 ENCOUNTER — Other Ambulatory Visit: Payer: Self-pay | Admitting: Family Medicine

## 2016-11-01 LAB — URINE CULTURE: Culture: >=100000 — AB

## 2016-11-08 ENCOUNTER — Inpatient Hospital Stay (HOSPITAL_COMMUNITY)
Admission: EM | Admit: 2016-11-08 | Discharge: 2016-11-11 | DRG: 535 | Disposition: A | Discharge status: Home Health | Payer: Medicare Other | Attending: Family Medicine | Admitting: Family Medicine

## 2016-11-08 ENCOUNTER — Emergency Department (HOSPITAL_COMMUNITY): Payer: Medicare Other

## 2016-11-08 ENCOUNTER — Encounter (HOSPITAL_COMMUNITY): Payer: Self-pay | Admitting: Emergency Medicine

## 2016-11-08 DIAGNOSIS — I4891 Unspecified atrial fibrillation: Secondary | ICD-10-CM | POA: Diagnosis present

## 2016-11-08 DIAGNOSIS — M25552 Pain in left hip: Secondary | ICD-10-CM | POA: Diagnosis not present

## 2016-11-08 DIAGNOSIS — S32472A Displaced fracture of medial wall of left acetabulum, initial encounter for closed fracture: Secondary | ICD-10-CM

## 2016-11-08 DIAGNOSIS — S32409A Unspecified fracture of unspecified acetabulum, initial encounter for closed fracture: Secondary | ICD-10-CM | POA: Diagnosis present

## 2016-11-08 DIAGNOSIS — I428 Other cardiomyopathies: Secondary | ICD-10-CM | POA: Diagnosis not present

## 2016-11-08 DIAGNOSIS — N183 Chronic kidney disease, stage 3 (moderate): Secondary | ICD-10-CM | POA: Diagnosis present

## 2016-11-08 DIAGNOSIS — S0990XA Unspecified injury of head, initial encounter: Secondary | ICD-10-CM | POA: Diagnosis not present

## 2016-11-08 DIAGNOSIS — I82622 Acute embolism and thrombosis of deep veins of left upper extremity: Secondary | ICD-10-CM | POA: Diagnosis not present

## 2016-11-08 DIAGNOSIS — S329XXA Fracture of unspecified parts of lumbosacral spine and pelvis, initial encounter for closed fracture: Secondary | ICD-10-CM | POA: Diagnosis present

## 2016-11-08 DIAGNOSIS — G309 Alzheimer's disease, unspecified: Secondary | ICD-10-CM | POA: Diagnosis present

## 2016-11-08 DIAGNOSIS — S32414A Nondisplaced fracture of anterior wall of right acetabulum, initial encounter for closed fracture: Secondary | ICD-10-CM

## 2016-11-08 DIAGNOSIS — Z79899 Other long term (current) drug therapy: Secondary | ICD-10-CM

## 2016-11-08 DIAGNOSIS — S32415A Nondisplaced fracture of anterior wall of left acetabulum, initial encounter for closed fracture: Secondary | ICD-10-CM | POA: Diagnosis not present

## 2016-11-08 DIAGNOSIS — I340 Nonrheumatic mitral (valve) insufficiency: Secondary | ICD-10-CM | POA: Diagnosis present

## 2016-11-08 DIAGNOSIS — F0280 Dementia in other diseases classified elsewhere without behavioral disturbance: Secondary | ICD-10-CM | POA: Diagnosis not present

## 2016-11-08 DIAGNOSIS — M7989 Other specified soft tissue disorders: Secondary | ICD-10-CM | POA: Diagnosis not present

## 2016-11-08 DIAGNOSIS — H409 Unspecified glaucoma: Secondary | ICD-10-CM | POA: Diagnosis present

## 2016-11-08 DIAGNOSIS — M6281 Muscle weakness (generalized): Secondary | ICD-10-CM

## 2016-11-08 DIAGNOSIS — S32402A Unspecified fracture of left acetabulum, initial encounter for closed fracture: Secondary | ICD-10-CM | POA: Diagnosis present

## 2016-11-08 DIAGNOSIS — F0281 Dementia in other diseases classified elsewhere with behavioral disturbance: Secondary | ICD-10-CM | POA: Diagnosis not present

## 2016-11-08 DIAGNOSIS — R748 Abnormal levels of other serum enzymes: Secondary | ICD-10-CM

## 2016-11-08 DIAGNOSIS — S79912A Unspecified injury of left hip, initial encounter: Secondary | ICD-10-CM | POA: Diagnosis not present

## 2016-11-08 DIAGNOSIS — I130 Hypertensive heart and chronic kidney disease with heart failure and stage 1 through stage 4 chronic kidney disease, or unspecified chronic kidney disease: Secondary | ICD-10-CM | POA: Diagnosis present

## 2016-11-08 DIAGNOSIS — W19XXXA Unspecified fall, initial encounter: Secondary | ICD-10-CM | POA: Diagnosis present

## 2016-11-08 DIAGNOSIS — N179 Acute kidney failure, unspecified: Secondary | ICD-10-CM | POA: Diagnosis present

## 2016-11-08 DIAGNOSIS — R7989 Other specified abnormal findings of blood chemistry: Secondary | ICD-10-CM

## 2016-11-08 DIAGNOSIS — R296 Repeated falls: Secondary | ICD-10-CM | POA: Diagnosis present

## 2016-11-08 DIAGNOSIS — I5042 Chronic combined systolic (congestive) and diastolic (congestive) heart failure: Secondary | ICD-10-CM | POA: Diagnosis present

## 2016-11-08 DIAGNOSIS — Z7982 Long term (current) use of aspirin: Secondary | ICD-10-CM

## 2016-11-08 DIAGNOSIS — Z85820 Personal history of malignant melanoma of skin: Secondary | ICD-10-CM

## 2016-11-08 DIAGNOSIS — S32592A Other specified fracture of left pubis, initial encounter for closed fracture: Secondary | ICD-10-CM | POA: Diagnosis not present

## 2016-11-08 DIAGNOSIS — Z87891 Personal history of nicotine dependence: Secondary | ICD-10-CM

## 2016-11-08 DIAGNOSIS — Y92007 Garden or yard of unspecified non-institutional (private) residence as the place of occurrence of the external cause: Secondary | ICD-10-CM

## 2016-11-08 DIAGNOSIS — S299XXA Unspecified injury of thorax, initial encounter: Secondary | ICD-10-CM | POA: Diagnosis not present

## 2016-11-08 DIAGNOSIS — Z950 Presence of cardiac pacemaker: Secondary | ICD-10-CM

## 2016-11-08 DIAGNOSIS — R778 Other specified abnormalities of plasma proteins: Secondary | ICD-10-CM | POA: Diagnosis present

## 2016-11-08 DIAGNOSIS — S32512A Fracture of superior rim of left pubis, initial encounter for closed fracture: Secondary | ICD-10-CM | POA: Diagnosis not present

## 2016-11-08 LAB — CBC WITH DIFFERENTIAL/PLATELET
BASOS ABS: 0 10*3/uL (ref 0.0–0.1)
BASOS PCT: 0 %
EOS ABS: 0 10*3/uL (ref 0.0–0.7)
EOS PCT: 0 %
HCT: 31.7 % — ABNORMAL LOW (ref 39.0–52.0)
Hemoglobin: 10.4 g/dL — ABNORMAL LOW (ref 13.0–17.0)
Lymphocytes Relative: 5 %
Lymphs Abs: 0.3 10*3/uL — ABNORMAL LOW (ref 0.7–4.0)
MCH: 32 pg (ref 26.0–34.0)
MCHC: 32.8 g/dL (ref 30.0–36.0)
MCV: 97.5 fL (ref 78.0–100.0)
Monocytes Absolute: 0.3 10*3/uL (ref 0.1–1.0)
Monocytes Relative: 6 %
Neutro Abs: 4.9 10*3/uL (ref 1.7–7.7)
Neutrophils Relative %: 90 %
PLATELETS: 101 10*3/uL — AB (ref 150–400)
RBC: 3.25 MIL/uL — AB (ref 4.22–5.81)
RDW: 17.7 % — ABNORMAL HIGH (ref 11.5–15.5)
WBC: 5.5 10*3/uL (ref 4.0–10.5)

## 2016-11-08 LAB — COMPREHENSIVE METABOLIC PANEL
ALK PHOS: 74 U/L (ref 38–126)
ALT: 31 U/L (ref 17–63)
AST: 61 U/L — ABNORMAL HIGH (ref 15–41)
Albumin: 3.3 g/dL — ABNORMAL LOW (ref 3.5–5.0)
Anion gap: 9 (ref 5–15)
BUN: 42 mg/dL — ABNORMAL HIGH (ref 6–20)
CALCIUM: 8.7 mg/dL — AB (ref 8.9–10.3)
CHLORIDE: 109 mmol/L (ref 101–111)
CO2: 25 mmol/L (ref 22–32)
Creatinine, Ser: 1.59 mg/dL — ABNORMAL HIGH (ref 0.61–1.24)
GFR, EST AFRICAN AMERICAN: 43 mL/min — AB (ref >60)
GFR, EST NON AFRICAN AMERICAN: 37 mL/min — AB (ref >60)
Glucose, Bld: 89 mg/dL (ref 65–99)
Potassium: 4.7 mmol/L (ref 3.5–5.1)
Sodium: 143 mmol/L (ref 135–145)
Total Bilirubin: 3.2 mg/dL — ABNORMAL HIGH (ref 0.3–1.2)
Total Protein: 6.9 g/dL (ref 6.5–8.1)

## 2016-11-08 LAB — TROPONIN I: TROPONIN I: 0.42 ng/mL — AB (ref <0.03)

## 2016-11-08 LAB — CK: CK TOTAL: 794 U/L — AB (ref 49–397)

## 2016-11-08 MED ORDER — LATANOPROST 0.005 % OP SOLN
1.0000 [drp] | Freq: Every day | OPHTHALMIC | Status: DC
  Administered 2016-11-08 – 2016-11-10 (×3): 1 [drp] via OPHTHALMIC
  Filled 2016-11-08: qty 2.5

## 2016-11-08 MED ORDER — SODIUM CHLORIDE 0.9 % IV SOLN
Freq: Once | INTRAVENOUS | Status: AC
  Administered 2016-11-08: 1000 mL via INTRAVENOUS

## 2016-11-08 MED ORDER — LATANOPROST 0.005 % OP SOLN
OPHTHALMIC | Status: AC
  Filled 2016-11-08: qty 2.5

## 2016-11-08 MED ORDER — POLYETHYLENE GLYCOL 3350 17 G PO PACK: 17.0000 g | PACK | Freq: Every day | ORAL | Status: DC | PRN

## 2016-11-08 MED ORDER — ASPIRIN EC 81 MG PO TBEC
81.0000 mg | DELAYED_RELEASE_TABLET | Freq: Every day | ORAL | Status: DC
  Administered 2016-11-08 – 2016-11-11 (×4): 81 mg via ORAL
  Filled 2016-11-08 (×4): qty 1

## 2016-11-08 MED ORDER — AMLODIPINE BESYLATE 5 MG PO TABS
2.5000 mg | ORAL_TABLET | Freq: Every day | ORAL | Status: DC
  Administered 2016-11-08 – 2016-11-11 (×4): 2.5 mg via ORAL
  Filled 2016-11-08 (×4): qty 1

## 2016-11-08 MED ORDER — SODIUM CHLORIDE 0.9 % IV BOLUS (SEPSIS)
1000.0000 mL | Freq: Once | INTRAVENOUS | Status: AC
  Administered 2016-11-08: 1000 mL via INTRAVENOUS

## 2016-11-08 MED ORDER — SODIUM CHLORIDE 0.9 % IV SOLN
INTRAVENOUS | Status: DC
  Administered 2016-11-08 – 2016-11-11 (×4): via INTRAVENOUS

## 2016-11-08 MED ORDER — ENOXAPARIN SODIUM 40 MG/0.4ML ~~LOC~~ SOLN
40.0000 mg | SUBCUTANEOUS | Status: DC
  Administered 2016-11-08 – 2016-11-09 (×2): 40 mg via SUBCUTANEOUS
  Filled 2016-11-08 (×2): qty 0.4

## 2016-11-08 MED ORDER — ONDANSETRON HCL 4 MG/2ML IJ SOLN: 4.0000 mg | Freq: Four times a day (QID) | INTRAMUSCULAR | Status: DC | PRN

## 2016-11-08 MED ORDER — ONDANSETRON HCL 4 MG PO TABS: 4.0000 mg | ORAL_TABLET | Freq: Four times a day (QID) | ORAL | Status: DC | PRN

## 2016-11-08 MED ORDER — HYDROCODONE-ACETAMINOPHEN 5-325 MG PO TABS
1.0000 | ORAL_TABLET | ORAL | Status: DC | PRN
  Administered 2016-11-08 – 2016-11-10 (×2): 1 via ORAL
  Filled 2016-11-08 (×2): qty 1

## 2016-11-08 MED ORDER — DORZOLAMIDE HCL 2 % OP SOLN
1.0000 [drp] | Freq: Every day | OPHTHALMIC | Status: DC
  Administered 2016-11-09 – 2016-11-11 (×2): 1 [drp] via OPHTHALMIC
  Filled 2016-11-08: qty 10

## 2016-11-08 NOTE — ED Triage Notes (Signed)
Per EMS- pt fell at approximately 8pm last night and has been lying in floor since. Pt soiled. Pt yells when rolled but unable to state where he is hurting. Follows commands. Pt has dementia. Awaiting wife's arrival to bedside.

## 2016-11-08 NOTE — ED Notes (Signed)
Pt stable and ready for transport to 2A room 201. Report given to Marta Lamas, RN.

## 2016-11-08 NOTE — ED Provider Notes (Signed)
[facility] DEPT Provider Note   CSN: [id number] Arrival date & time: 11/08/16  1100  By signing my name below, I, Ethelle Lyon Long, attest that this documentation has been prepared under the direction and in the presence of Isla Pence, MD . Electronically Signed: Ethelle Lyon Long, Scribe. 11/08/2016. 12:16 PM.    History   Chief Complaint Chief Complaint  Patient presents with  . Fall   LEVEL 5 CAVEAT DUE TO PATIENT'S ALTERED MENTAL STATUS- DEMENTIA  The history is provided by the EMS personnel. No language interpreter was used.   HPI Comments:  [name] is a 83 y.o. male with a PMHx of Alzheimer Disease, who presents to the Emergency Department by way of ambulance s/p a fall last night. EMS reports pt was found on the floor by wife after a fall last night around 2000. At this time it is unsure what caused the fall, LOC, or head injury. Pt's wife is supposedly on the way, but pt is unable to give any history.  Pt's wife said that he went outside last night to get some wood for their wood burning stove.  He fell and she heard it, but did not see it.  He was unable to get up.  She had to call her daughter to bring him inside.  She put him on a chair which he tried slid out of in the night.  She said she got up to pull him back up in the chair about 3 times during the night.  He was still unable to get up and walk this morning, so she called EMS.   Past Medical History:  Diagnosis Date  . A-fib ([employer])   . Alzheimer disease   . Arthritis   . Asymptomatic carotid artery stenosis    BILATERAL MILD ICA---  <50% PER DUPLEX 03-08-2008  . BPH (benign prostatic hypertrophy)   . Cancer ([place])   . Cardiac pacemaker    CARDIOLOGIST-- DR Cristopher Peru (LAST PACE [facility] 05-15-2013)  . CHB (complete heart block) ([place])   . Chronic back pain   . Chronic pancreatitis ([place])    Based on CT findings  . Chronic systolic heart failure ([place])   . Costochondritis, acute   . Dementia   . Erectile  dysfunction   . Glaucoma   . History of melanoma in situ    JAN 2012--  LEFT HEEL W/ SLN BX  . History of syncope   . HOH (hard of hearing)   . Hypertension   . IC (interstitial cystitis)   . Nonischemic cardiomyopathy ([place])   . Pancytopenia   . Poor historian   . Rash and other nonspecific skin eruption   . Severe mitral regurgitation   . Urge urinary incontinence   . Visual changes     Patient Active Problem List   Diagnosis Date Noted  . Acetabular fracture ([place]) 11/08/2016  . Generalized anxiety disorder 04/05/2016  . Pigmented skin lesion 04/05/2016  . Acute CHF (congestive heart failure) ([place]) 03/15/2016  . Tricuspid regurgitation 03/15/2016  . Pulmonary hypertension 10/01/2015  . Fatigue 10/01/2015  . Chronic back pain 10/01/2015  . Myelodysplasia 06/20/2015  . Dyspnea 02/09/2015  . Recurrent falls 08/01/2014  . Atrial fibrillation, persistent ([place]) 02/22/2014  . Bilateral leg weakness 05/01/2013  . Abnormal gait 06/12/2012  . GERD (gastroesophageal reflux disease) 05/09/2012  . Alzheimer's dementia 11/17/2011  . GEN OSTEOARTHROSIS INVOLVING MULTIPLE SITES 11/25/2009  . Central hearing loss 11/10/2009  . Biventricular cardiac pacemaker in situ  07/22/2009  . INTERSTITIAL CYSTITIS 07/14/2009  . Pancytopenia ([place]) 12/31/2008  . MITRAL REGURGITATION, MODERATE 12/31/2008  . Chronic systolic heart failure ([place]) 12/31/2008  . ERECTILE DYSFUNCTION 01/31/2008  . Glaucoma 01/31/2008  . Essential hypertension 01/31/2008    Past Surgical History:  Procedure Laterality Date  . BACK SURGERY  X3  LAST ONE'90's  . CATARACT EXTRACTION, BILATERAL  left  1997/   right 2003  . COLONOSCOPY  08/18/2006   [id number] granularity and erosions of the rectum of uncertain significance biopsied.  A long redundant, but otherwise normal-appearing colon.melanosi coli and minimal inflammation on bx  . COLONOSCOPY N/A 08/05/2014   Procedure: COLONOSCOPY;  Surgeon: [name], MD;   Location[facility];  Service: Endoscopy;  Laterality: N/A;  PT NEEDS TCS AT 1000.  Marland Kitchen CYSTO/ HOD/  REPLACEMENT INTERSTIM IMPLANT  05-14-2003  . CYSTOSCOPY W/ RETROGRADES N/A 02/11/2016   Procedure: CYSTOSCOPY WITH RETROGRADE PYELOGRAM;  Surgeon: Cleon Gustin, MD;  Location: [facility] ORS;  Service: Urology;  Laterality: N/A;  . ESOPHAGOGASTRODUODENOSCOPY N/A 03/28/2014   Dr. [name]: normal esophagus s/p dilation, mosaic appearance - chronic inflammation noted on bx  . IMPLANTABLE CARDIOVERTER DEFIBRILLATOR (ICD) GENERATOR CHANGE N/A 10/11/2014   Procedure: ICD GENERATOR CHANGE;  Surgeon: [name], MD;  Location: [facility] CATH LAB;  Service: Cardiovascular;  Laterality: N/A;  . INGUINAL HERNIA REPAIR Bilateral AS TEEN  . INTERSTIM IMPLANT PLACEMENT  2001  &  2007  . INTERSTIM IMPLANT REVISION N/A 08/28/2013   Procedure: REPLACEMENT OF INTERSTIM-GENERATOR AND LEAD ;  Surgeon: [name], MD;  Location: [facility];  Service: Urology;  Laterality: N/A;  . LEFT ELBOW SURGERY  2002  . MALONEY DILATION N/A 03/28/2014   Procedure: Venia Minks DILATION;  Surgeon: [name], MD;  [facility];  Service: Endoscopy;  Laterality: N/A;  . PACEMAKER INSERTION  12-06-2008  DR [name]   BiV PPM  --  [employer]  . REMOVAL INTERSTIM IMPLANT/  CYST/  HOD  04-14-2004  . REVISION INTERSTIM IMPLANT AND REPLACE GENERATOR  05-15-2009   left upper buttock for urge urinary incontinence  . SAVORY DILATION N/A 03/28/2014   Procedure: SAVORY DILATION;  Surgeon: [name], MD;  [facility];  Service: Endoscopy;  Laterality: N/A;  . TONSILLECTOMY  1950  . TRANSTHORACIC ECHOCARDIOGRAM  12-06-2008   LVSF 55-60%/  MODERATE MR/  MILD AR/  QUESTION DISTAL POSTERIOR HYPOKINESIS  . TRANSURETHRAL RESECTION OF BLADDER TUMOR WITH GYRUS (TURBT-GYRUS) N/A 02/11/2016   Procedure: TRANSURETHRAL RESECTION OF BLADDER TUMOR WITH GYRUS (TURBT-GYRUS);  Surgeon: Cleon Gustin, MD;   Location: [facility] ORS;  Service: Urology;  Laterality: N/A;  . WIDE EXCISION LEFT HEEL AND SLN BX  JUNE 2012       Home Medications    Prior to Admission medications   Medication Sig Start Date End Date Taking? Authorizing Provider  acetaminophen (TYLENOL) 650 MG CR tablet Take 650 mg by mouth every 8 (eight) hours as needed for pain.    Yes Historical Provider, MD  amLODipine (NORVASC) 2.5 MG tablet TAKE 1 TABLET (2.5 MG TOTAL) BY MOUTH DAILY. 11/05/16  Yes Fayrene Helper, MD  aspirin EC 81 MG EC tablet Take 1 tablet (81 mg total) by mouth daily. 03/16/16  Yes Samuella Cota, MD  busPIRone (BUSPAR) 5 MG tablet TAKE 1 TABLET BY MOUTH EVERY DAY AS NEEDED FOR ANXIETY 07/27/16  Yes Fayrene Helper, MD  diclofenac sodium (VOLTAREN) 1 %  GEL Apply 2 g topically daily as needed (pain). Reported on 01/05/2016 12/04/15  Yes Historical Provider, MD  dorzolamide (TRUSOPT) 2 % ophthalmic solution Place 1 drop into both eyes daily. One drop both eyes    Yes Historical Provider, MD  ENSURE (ENSURE) Take 237 mLs by mouth daily. Pt takes daily    Yes Historical Provider, MD  furosemide (LASIX) 20 MG tablet Take 20 mg by mouth daily.   Yes Historical Provider, MD  KLOR-CON M20 20 MEQ tablet TAKE 1 TABLET (20 MEQ TOTAL) BY MOUTH DAILY. 10/05/16  Yes Manon Hilding Kefalas, PA-C  latanoprost (XALATAN) 0.005 % ophthalmic solution Place 1 drop into both eyes at bedtime. Both eyes    Yes Historical Provider, MD  Meth-Hyo-M Bl-Na Phos-Ph Sal (URIBEL) 118 MG CAPS Take 1 capsule (118 mg total) by mouth 2 (two) times daily as needed (bladder). 02/11/16  Yes Cleon Gustin, MD  Carboxymethylcellul-Glycerin 0.5-0.9 % SOLN Apply 1 drop to eye daily. One drop both eyes     Historical Provider, MD  furosemide (LASIX) 20 MG tablet Take 1 tablet (20 mg total) by mouth daily. May take an additional 20 mg in the afternoon AS NEEDED 05/20/16 08/18/16  Lendon Colonel, NP  ibuprofen (ADVIL,MOTRIN) 200 MG tablet Take 400 mg by mouth  every 6 (six) hours as needed for moderate pain.    Historical Provider, MD  metolazone (ZAROXOLYN) 2.5 MG tablet Take 1 tablet today before your extra lasix pill this afternoon Patient not taking: Reported on 09/09/2016 08/23/16   Lendon Colonel, NP  Multiple Vitamins-Minerals (CENTRUM SILVER 50+MEN) TABS Take 1 tablet by mouth daily. Daily     Historical Provider, MD  polyethylene glycol powder (GLYCOLAX/MIRALAX) powder Take 17 g by mouth daily. Patient taking differently: Take 17 g by mouth daily as needed for mild constipation.  05/16/15   Fayrene Helper, MD    Family History Family History  Problem Relation Age of Onset  . Dementia Mother   . Throat cancer Father   . Cancer Father 86    throat  . Lung cancer Sister   . Heart disease Brother     Social History Social History  Substance Use Topics  . Smoking status: Former Smoker    Packs/day: 0.02    Years: 30.00    Types: Cigarettes    Quit date: 08/24/1997  . Smokeless tobacco: Never Used  . Alcohol use No     Allergies   Codeine and Penicillins   Review of Systems Review of Systems  Unable to perform ROS: Dementia       Physical Exam Updated Vital Signs BP 123/85 (BP Location: Left Arm)   Pulse 64   Temp (!) 95 F (35 C) (Rectal)   Resp 16   Ht 5\' 10"  (1.778 m)   Wt 175 lb (79.4 kg)   SpO2 96%   BMI 25.11 kg/m   Physical Exam  Constitutional: He appears well-developed and well-nourished.  HENT:  Head: Normocephalic and atraumatic.  Right Ear: External ear normal.  Left Ear: External ear normal.  Nose: Nose normal.  Eyes: Right eye exhibits no discharge. Left eye exhibits no discharge.  Neck: Neck supple.  Cardiovascular: Normal rate, regular rhythm and normal heart sounds.   Pulmonary/Chest: Effort normal and breath sounds normal.  Abdominal: Soft. There is no tenderness.  Musculoskeletal: He exhibits no edema.  Ecchymosis present on bilateral upper extremities. Decreased ROM left  lower leg.   Neurological: He is alert.  Oriented x1.  Skin: Skin is warm and dry.  Nursing note and vitals reviewed.    ED Treatments / Results  DIAGNOSTIC STUDIES:  Oxygen Saturation is 96% on RA, adequate by my interpretation.    COORDINATION OF CARE:  11:19 AM Discussed treatment plan with pt at bedside and pt agreed to plan.  Labs (all labs ordered are listed, but only abnormal results are displayed) Labs Reviewed  COMPREHENSIVE METABOLIC PANEL - Abnormal; Notable for the following:       Result Value   BUN 42 (*)    Creatinine, Ser 1.59 (*)    Calcium 8.7 (*)    Albumin 3.3 (*)    AST 61 (*)    Total Bilirubin 3.2 (*)    GFR calc non Af Amer 37 (*)    GFR calc Af Amer 43 (*)    All other components within normal limits  TROPONIN I - Abnormal; Notable for the following:    Troponin I 0.42 (*)    All other components within normal limits  CBC WITH DIFFERENTIAL/PLATELET - Abnormal; Notable for the following:    RBC 3.25 (*)    Hemoglobin 10.4 (*)    HCT 31.7 (*)    RDW 17.7 (*)    Platelets 101 (*)    Lymphs Abs 0.3 (*)    All other components within normal limits  CK - Abnormal; Notable for the following:    Total CK 794 (*)    All other components within normal limits    EKG  EKG Interpretation  Date/Time:  Monday November 08 2016 11:22:57 EST Ventricular Rate:  80 PR Interval:    QRS Duration: 140 QT Interval:  458 QTC Calculation: 529 R Axis:   -58 Text Interpretation:  Ventricular-paced complexes No further analysis attempted due to paced rhythm Baseline wander in lead(s) V1 No significant change since last tracing Confirmed by [facility] MD, Tarik Teixeira ([id number]) on 11/08/2016 11:50:26 AM       Radiology Dg Chest 2 View  Result Date: 11/08/2016 CLINICAL DATA:  Less post fall last night in reports left hip pain. Patient has history of atrial fibrillation, myelodysplasia, former smoker. EXAM: CHEST  2 VIEW COMPARISON:  PA and lateral chest x-ray dated  September 16, 2016. FINDINGS: The lungs are well-expanded. There is no focal infiltrate. There is no pleural effusion. The cardiac silhouette remains enlarged. The central pulmonary vascularity is prominent. There is minimal interstitial prominence that is stable and may reflect chronic bronchitic change or low-grade compensated CHF. The ICD is in stable position. There is calcification in the wall of the aortic arch. There is no pleural effusion. IMPRESSION: Probable low-grade compensated CHF. No acute cardiopulmonary abnormality. Thoracic aortic atherosclerosis. Electronically Signed   By: David  Martinique M.D.   On: 11/08/2016 12:27   Ct Head Wo Contrast  Result Date: 11/08/2016 CLINICAL DATA:  Fall. EXAM: CT HEAD WITHOUT CONTRAST TECHNIQUE: Contiguous axial images were obtained from the base of the skull through the vertex without intravenous contrast. COMPARISON:  CT scan of Feb 19, 2016. FINDINGS: Brain: Mild diffuse cortical atrophy is noted. Minimal chronic ischemic white matter disease is noted. No mass effect or midline shift is noted. Ventricular size is within normal limits. There is no evidence of mass lesion, hemorrhage or acute infarction. Vascular: No hyperdense vessel or unexpected calcification. Skull: Normal. Negative for fracture or focal lesion. Sinuses/Orbits: No acute finding. Other: None. IMPRESSION: Mild diffuse cortical atrophy. Minimal chronic ischemic white matter disease. No acute intracranial  abnormality seen. Electronically Signed   By: Marijo Conception, M.D.   On: 11/08/2016 12:30   Ct Hip Left Wo Contrast  Result Date: 11/08/2016 CLINICAL DATA:  Left hip pain secondary to a fall last night. Abnormal radiographs. EXAM: CT OF THE LEFT HIP WITHOUT CONTRAST TECHNIQUE: Multidetector CT imaging of the left hip was performed according to the standard protocol. Multiplanar CT image reconstructions were also generated. COMPARISON:  Radiographs dated 11/08/2016 FINDINGS: Bones/Joint/Cartilage  There is a slightly displaced fracture of the left inferior pubic ramus. There is a comminuted fracture of the medial wall of the left acetabulum extending superiorly into the left ilium. There is suggestion of a fracture of the left sacral ala on image 1 of series 2 but this is not definitive. Proximal left femur appears intact.  Left hip hemarthrosis. IMPRESSION: 1. Fracture of the medial wall of the left acetabulum extending superiorly. 2. Fracture of the left inferior pubic ramus. 3. Left hip hemarthrosis. 4. Possible subtle impaction fracture of the left sacral ala. Electronically Signed   By: Lorriane Shire M.D.   On: 11/08/2016 14:28   Dg Hip Unilat W Or Wo Pelvis 2-3 Views Left  Result Date: 11/08/2016 CLINICAL DATA:  Status post fall last night with pain in the left hip. History of Milo dysplasia, chronic CHF. EXAM: DG HIP (WITH OR WITHOUT PELVIS) 2-3V LEFT COMPARISON:  Left hip series dated August 01, 2014 FINDINGS: The bones are subjectively osteopenic. There is deformity of the inferior pubic ramus on the left which has appeared since October 2015. This may be acute or chronic. No definite superior pubic ramus fracture is observed but a fracture involving the lateral aspect of the superior pubic ramus with its junction with the ischium may be present. There is moderate symmetric narrowing of the left hip joint space. The femoral head and acetabulum appear intact. The femoral neck, intertrochanteric, and subtrochanteric regions are normal. IMPRESSION: Deformity of the inferior pubic ramus worrisome former for fracture which is likely acute. A definite superior pubic ramus fracture on the left is not clearly identified but may be present laterally. No acute left hip fracture. Mild symmetric narrowing of the left hip joint space. Electronically Signed   By: David  Martinique M.D.   On: 11/08/2016 12:25    Procedures Procedures (including critical care time)  Medications Ordered in ED Medications    0.9 %  sodium chloride infusion (not administered)  sodium chloride 0.9 % bolus 1,000 mL (1,000 mLs Intravenous New Bag/Given 11/08/16 1235)     Initial Impression / Assessment and Plan / ED Course  I have reviewed the triage vital signs and the nursing notes.  Pertinent labs & imaging results that were available during my care of the patient were reviewed by me and considered in my medical decision making (see chart for details).  Pt's elevation in troponin is chronic.   Pt d/w Dr. Aline Brochure (ortho) who would not recommend surgery.  He does recommend admission for pain control and rehab.  Pt d/w Dr. Darrick Meigs (triad) for admission.  Final Clinical Impressions(s) / ED Diagnoses   Final diagnoses:  Fall, initial encounter  Inferior pubic ramus fracture, left, closed, initial encounter ([place])  Closed displaced fracture of medial wall of left acetabulum, initial encounter ([place])  Alzheimer's dementia without behavioral disturbance, unspecified timing of dementia onset  Elevated troponin    New Prescriptions New Prescriptions   No medications on file   I personally performed the services described in this documentation,  which was scribed in my presence. The recorded information has been reviewed and is accurate.     Isla Pence, MD 11/08/16 [phone]

## 2016-11-08 NOTE — Progress Notes (Signed)
Dr. Darrick Meigs confirmed that patient is med-surg status and does not require cardiac monitoring.

## 2016-11-08 NOTE — H&P (Addendum)
TRH H&P    Patient Demographics:    [name], is a 83 y.o. male  MRN: [id number]  DOB - 1926-07-12  Admit Date - 11/08/2016  Referring MD/NP/PA: Dr. Gilford Raid  Outpatient Primary MD for the patient is [name], MD  Patient coming from: Home  Chief Complaint  Patient presents with  . Fall      HPI:    [name]  is a 83 y.o. male, With history of Alzheimer disease, who was brought to the ED after patient fell at home. He was found for by wife after a fall last night around 8 PM. As the patient's wife he went outside last night to get some work for that [employer] and he fell he was unable to get up. She had to call her daughter to bring him inside. She put him in a chair from which he stated out in the night. Patient was unable to walk this morning. So she called EMS.  There is no history of syncope, no nausea vomiting or diarrhea. No chest pain.  In the ED x-ray of the pelvis showed deformity of the inferior pubic ramus what is some for fracture. Definite superior pubic ramus fracture on the left is not clearly identified. No acute left hip fracture. CT of the hip showed 1. Fracture of the medial wall of the left acetabulum extending superiorly. 2. Fracture of the left inferior pubic ramus. 3. Left hip hemarthrosis. 4. Possible subtle impaction fracture of the left sacral ala  Patient does have chronically elevated troponin. No chest pain. EKG shows paced rhythm.  Orthopedic surgery Dr. Aline Brochure was consulted and he recommended conservative management. No surgical option recommended at this time.    Review of systems:    Unobtainable due to patient's dementia  A full 10 point Review of Systems was done, except as stated above, all other Review of Systems were negative.   With Past History of the following :    Past Medical History:  Diagnosis Date  . A-fib ([place])   . Alzheimer  disease   . Arthritis   . Asymptomatic carotid artery stenosis    BILATERAL MILD ICA---  <50% PER DUPLEX 03-08-2008  . BPH (benign prostatic hypertrophy)   . Cancer ([place])   . Cardiac pacemaker    CARDIOLOGIST-- DR Cristopher Peru (LAST PACE [facility] 05-15-2013)  . CHB (complete heart block) ([place])   . Chronic back pain   . Chronic pancreatitis ([place])    Based on CT findings  . Chronic systolic heart failure ([place])   . Costochondritis, acute   . Dementia   . Erectile dysfunction   . Glaucoma   . History of melanoma in situ    JAN 2012--  LEFT HEEL W/ SLN BX  . History of syncope   . HOH (hard of hearing)   . Hypertension   . IC (interstitial cystitis)   . Nonischemic cardiomyopathy ([place])   . Pancytopenia   . Poor historian   . Rash and other nonspecific skin eruption   . Severe  mitral regurgitation   . Urge urinary incontinence   . Visual changes       Past Surgical History:  Procedure Laterality Date  . BACK SURGERY  X3  LAST ONE'90's  . CATARACT EXTRACTION, BILATERAL  left  1997/   right 2003  . COLONOSCOPY  08/18/2006   [id number] granularity and erosions of the rectum of uncertain significance biopsied.  A long redundant, but otherwise normal-appearing colon.melanosi coli and minimal inflammation on bx  . COLONOSCOPY N/A 08/05/2014   Procedure: COLONOSCOPY;  Surgeon: [name], MD;  Location[facility];  Service: Endoscopy;  Laterality: N/A;  PT NEEDS TCS AT 1000.  Marland Kitchen CYSTO/ HOD/  REPLACEMENT INTERSTIM IMPLANT  05-14-2003  . CYSTOSCOPY W/ RETROGRADES N/A 02/11/2016   Procedure: CYSTOSCOPY WITH RETROGRADE PYELOGRAM;  Surgeon: Cleon Gustin, MD;  Location: [facility] ORS;  Service: Urology;  Laterality: N/A;  . ESOPHAGOGASTRODUODENOSCOPY N/A 03/28/2014   Dr. [name]: normal esophagus s/p dilation, mosaic appearance - chronic inflammation noted on bx  . IMPLANTABLE CARDIOVERTER DEFIBRILLATOR (ICD) GENERATOR CHANGE N/A 10/11/2014   Procedure: ICD GENERATOR CHANGE;  Surgeon: [name], MD;  Location: [facility] CATH LAB;  Service: Cardiovascular;  Laterality: N/A;  . INGUINAL HERNIA REPAIR Bilateral AS TEEN  . INTERSTIM IMPLANT PLACEMENT  2001  &  2007  . INTERSTIM IMPLANT REVISION N/A 08/28/2013   Procedure: REPLACEMENT OF INTERSTIM-GENERATOR AND LEAD ;  Surgeon: [name], MD;  Location: [facility];  Service: Urology;  Laterality: N/A;  . LEFT ELBOW SURGERY  2002  . MALONEY DILATION N/A 03/28/2014   Procedure: Venia Minks DILATION;  Surgeon: [name], MD;  Location: [facility];  Service: Endoscopy;  Laterality: N/A;  . PACEMAKER INSERTION  12-06-2008  DR [name]   BiV PPM  --  [employer]  . REMOVAL INTERSTIM IMPLANT/  CYST/  HOD  04-14-2004  . REVISION INTERSTIM IMPLANT AND REPLACE GENERATOR  05-15-2009   left upper buttock for urge urinary incontinence  . SAVORY DILATION N/A 03/28/2014   Procedure: SAVORY DILATION;  Surgeon: [name], MD;  [facility];  Service: Endoscopy;  Laterality: N/A;  . TONSILLECTOMY  1950  . TRANSTHORACIC ECHOCARDIOGRAM  12-06-2008   LVSF 55-60%/  MODERATE MR/  MILD AR/  QUESTION DISTAL POSTERIOR HYPOKINESIS  . TRANSURETHRAL RESECTION OF BLADDER TUMOR WITH GYRUS (TURBT-GYRUS) N/A 02/11/2016   Procedure: TRANSURETHRAL RESECTION OF BLADDER TUMOR WITH GYRUS (TURBT-GYRUS);  Surgeon: Cleon Gustin, MD;  Location: [facility] ORS;  Service: Urology;  Laterality: N/A;  . WIDE EXCISION LEFT HEEL AND SLN BX  JUNE 2012      Social History:      Social History  Substance Use Topics  . Smoking status: Former Smoker    Packs/day: 0.02    Years: 30.00    Types: Cigarettes    Quit date: 08/24/1997  . Smokeless tobacco: Never Used  . Alcohol use No       Family History :     Family History  Problem Relation Age of Onset  . Dementia Mother   . Throat cancer Father   . Cancer Father 86    throat  . Lung cancer Sister   . Heart disease Brother    *   Home Medications:   Prior to Admission  medications   Medication Sig Start Date End Date Taking? Authorizing Provider  acetaminophen (TYLENOL) 650 MG CR tablet Take 650 mg by mouth every 8 (eight) hours as needed  for pain.    Yes Historical Provider, MD  amLODipine (NORVASC) 2.5 MG tablet TAKE 1 TABLET (2.5 MG TOTAL) BY MOUTH DAILY. 11/05/16  Yes Fayrene Helper, MD  aspirin EC 81 MG EC tablet Take 1 tablet (81 mg total) by mouth daily. 03/16/16  Yes Samuella Cota, MD  busPIRone (BUSPAR) 5 MG tablet TAKE 1 TABLET BY MOUTH EVERY DAY AS NEEDED FOR ANXIETY 07/27/16  Yes Fayrene Helper, MD  diclofenac sodium (VOLTAREN) 1 % GEL Apply 2 g topically daily as needed (pain). Reported on 01/05/2016 12/04/15  Yes Historical Provider, MD  dorzolamide (TRUSOPT) 2 % ophthalmic solution Place 1 drop into both eyes daily. One drop both eyes    Yes Historical Provider, MD  ENSURE (ENSURE) Take 237 mLs by mouth daily. Pt takes daily    Yes Historical Provider, MD  furosemide (LASIX) 20 MG tablet Take 20 mg by mouth daily.   Yes Historical Provider, MD  KLOR-CON M20 20 MEQ tablet TAKE 1 TABLET (20 MEQ TOTAL) BY MOUTH DAILY. 10/05/16  Yes Manon Hilding Kefalas, PA-C  latanoprost (XALATAN) 0.005 % ophthalmic solution Place 1 drop into both eyes at bedtime. Both eyes    Yes Historical Provider, MD  Meth-Hyo-M Bl-Na Phos-Ph Sal (URIBEL) 118 MG CAPS Take 1 capsule (118 mg total) by mouth 2 (two) times daily as needed (bladder). 02/11/16  Yes Cleon Gustin, MD  Carboxymethylcellul-Glycerin 0.5-0.9 % SOLN Apply 1 drop to eye daily. One drop both eyes     Historical Provider, MD  furosemide (LASIX) 20 MG tablet Take 1 tablet (20 mg total) by mouth daily. May take an additional 20 mg in the afternoon AS NEEDED 05/20/16 08/18/16  Lendon Colonel, NP  ibuprofen (ADVIL,MOTRIN) 200 MG tablet Take 400 mg by mouth every 6 (six) hours as needed for moderate pain.    Historical Provider, MD  metolazone (ZAROXOLYN) 2.5 MG tablet Take 1 tablet today before your extra lasix  pill this afternoon Patient not taking: Reported on 09/09/2016 08/23/16   Lendon Colonel, NP  Multiple Vitamins-Minerals (CENTRUM SILVER 50+MEN) TABS Take 1 tablet by mouth daily. Daily     Historical Provider, MD  polyethylene glycol powder (GLYCOLAX/MIRALAX) powder Take 17 g by mouth daily. Patient taking differently: Take 17 g by mouth daily as needed for mild constipation.  05/16/15   Fayrene Helper, MD     Allergies:     Codeine, penicillins   Physical Exam:   Vitals  Blood pressure 125/93, pulse 64, temperature (!) 95 F (35 C), temperature source Rectal, resp. rate 15, height 5\' 10"  (1.778 m), weight 79.4 kg (175 lb), SpO2 96 %.  1.  General: Appears lethargic,   2. Psychiatric: Confused  3. Neurologic: Moving all extremities  4. Eyes :  anicteric sclerae, moist conjunctivae with no lid lag. PERRLA.  5. ENMT:  Oropharynx clear with moist mucous membranes and good dentition  6. Neck:  supple, no cervical lymphadenopathy appriciated, No thyromegaly  7. Respiratory : Normal respiratory effort, good air movement bilaterally,clear to  auscultation bilaterally  8. Cardiovascular : RRR, no gallops, rubs or murmurs, no leg edema  9. Gastrointestinal:  Positive bowel sounds, abdomen soft, non-tender to palpation,no hepatosplenomegaly, no rigidity or guarding       10. Skin:  No cyanosis, normal texture and turgor, no rash, lesions or ulcers  11.Musculoskeletal:  Good muscle tone,  joints appear normal , no effusions,  normal range of motion    Data Review:  CBC  Recent Labs Lab 11/08/16 1232  WBC 5.5  HGB 10.4*  HCT 31.7*  PLT 101*  MCV 97.5  MCH 32.0  MCHC 32.8  RDW 17.7*  LYMPHSABS 0.3*  MONOABS 0.3  EOSABS 0.0  BASOSABS 0.0   ------------------------------------------------------------------------------------------------------------------  Chemistries   Recent Labs Lab 11/08/16 1232  NA 143  K 4.7  CL 109  CO2 25  GLUCOSE 89   BUN 42*  CREATININE 1.59*  CALCIUM 8.7*  AST 61*  ALT 31  ALKPHOS 74  BILITOT 3.2*   ------------------------------------------------------------------------------------------------------------------  ------------------------------------------------------------------------------------------------------------------ GFR: Estimated Creatinine Clearance: 33.2 mL/min (by C-G formula based on SCr of 1.59 mg/dL (H)). Liver Function Tests:  Recent Labs Lab 11/08/16 1232  AST 61*  ALT 31  ALKPHOS 74  BILITOT 3.2*  PROT 6.9  ALBUMIN 3.3*   Cardiac Enzymes:  Recent Labs Lab 11/08/16 1232  CKTOTAL 794*  TROPONINI 0.42*    --------------------------------------------------------------------------------------------------------------- Urine analysis:    Component Value Date/Time   COLORURINE GREEN (A) 10/27/2016 1417   APPEARANCEUR CLEAR 10/27/2016 1417   LABSPEC 1.015 10/27/2016 1417   PHURINE 6.0 10/27/2016 1417   GLUCOSEU NEGATIVE 10/27/2016 1417   HGBUR NEGATIVE 10/27/2016 1417   BILIRUBINUR MODERATE (A) 10/27/2016 1417   BILIRUBINUR neg 12/28/2013 1142   KETONESUR NEGATIVE 10/27/2016 1417   PROTEINUR TRACE (A) 10/27/2016 1417   UROBILINOGEN 1.0 12/28/2013 1142   NITRITE NEGATIVE 10/27/2016 1417   LEUKOCYTESUR SMALL (A) 10/27/2016 1417      Imaging Results:    Dg Chest 2 View  Result Date: 11/08/2016 CLINICAL DATA:  Less post fall last night in reports left hip pain. Patient has history of atrial fibrillation, myelodysplasia, former smoker. EXAM: CHEST  2 VIEW COMPARISON:  PA and lateral chest x-ray dated September 16, 2016. FINDINGS: The lungs are well-expanded. There is no focal infiltrate. There is no pleural effusion. The cardiac silhouette remains enlarged. The central pulmonary vascularity is prominent. There is minimal interstitial prominence that is stable and may reflect chronic bronchitic change or low-grade compensated CHF. The ICD is in stable position.  There is calcification in the wall of the aortic arch. There is no pleural effusion. IMPRESSION: Probable low-grade compensated CHF. No acute cardiopulmonary abnormality. Thoracic aortic atherosclerosis. Electronically Signed   By: David  Martinique M.D.   On: 11/08/2016 12:27   Ct Head Wo Contrast  Result Date: 11/08/2016 CLINICAL DATA:  Fall. EXAM: CT HEAD WITHOUT CONTRAST TECHNIQUE: Contiguous axial images were obtained from the base of the skull through the vertex without intravenous contrast. COMPARISON:  CT scan of Feb 19, 2016. FINDINGS: Brain: Mild diffuse cortical atrophy is noted. Minimal chronic ischemic white matter disease is noted. No mass effect or midline shift is noted. Ventricular size is within normal limits. There is no evidence of mass lesion, hemorrhage or acute infarction. Vascular: No hyperdense vessel or unexpected calcification. Skull: Normal. Negative for fracture or focal lesion. Sinuses/Orbits: No acute finding. Other: None. IMPRESSION: Mild diffuse cortical atrophy. Minimal chronic ischemic white matter disease. No acute intracranial abnormality seen. Electronically Signed   By: Marijo Conception, M.D.   On: 11/08/2016 12:30   Ct Hip Left Wo Contrast  Result Date: 11/08/2016 CLINICAL DATA:  Left hip pain secondary to a fall last night. Abnormal radiographs. EXAM: CT OF THE LEFT HIP WITHOUT CONTRAST TECHNIQUE: Multidetector CT imaging of the left hip was performed according to the standard protocol. Multiplanar CT image reconstructions were also generated. COMPARISON:  Radiographs dated 11/08/2016 FINDINGS: Bones/Joint/Cartilage There is  a slightly displaced fracture of the left inferior pubic ramus. There is a comminuted fracture of the medial wall of the left acetabulum extending superiorly into the left ilium. There is suggestion of a fracture of the left sacral ala on image 1 of series 2 but this is not definitive. Proximal left femur appears intact.  Left hip hemarthrosis.  IMPRESSION: 1. Fracture of the medial wall of the left acetabulum extending superiorly. 2. Fracture of the left inferior pubic ramus. 3. Left hip hemarthrosis. 4. Possible subtle impaction fracture of the left sacral ala. Electronically Signed   By: Lorriane Shire M.D.   On: 11/08/2016 14:28   Dg Hip Unilat W Or Wo Pelvis 2-3 Views Left  Result Date: 11/08/2016 CLINICAL DATA:  Status post fall last night with pain in the left hip. History of Milo dysplasia, chronic CHF. EXAM: DG HIP (WITH OR WITHOUT PELVIS) 2-3V LEFT COMPARISON:  Left hip series dated August 01, 2014 FINDINGS: The bones are subjectively osteopenic. There is deformity of the inferior pubic ramus on the left which has appeared since October 2015. This may be acute or chronic. No definite superior pubic ramus fracture is observed but a fracture involving the lateral aspect of the superior pubic ramus with its junction with the ischium may be present. There is moderate symmetric narrowing of the left hip joint space. The femoral head and acetabulum appear intact. The femoral neck, intertrochanteric, and subtrochanteric regions are normal. IMPRESSION: Deformity of the inferior pubic ramus worrisome former for fracture which is likely acute. A definite superior pubic ramus fracture on the left is not clearly identified but may be present laterally. No acute left hip fracture. Mild symmetric narrowing of the left hip joint space. Electronically Signed   By: David  Martinique M.D.   On: 11/08/2016 12:25    My personal review of EKG: Rhythm - paced rhythm   Assessment & Plan:    Active Problems:   Acetabular fracture ([place])   Pelvic fracture ([place])   1. Acetabular fracture- patient has left acetabular fracture, fracture of the left inferior pubic ramus. No surgical intervention needed at this time. Orthopedic surgeon Dr. Aline Brochure discussed with Dr. Gilford Raid. Will place under observation for pain management. 2. Elevated troponin- patient has  chronically elevated troponin, today troponin was 0.42. EKG showed paced rhythm. Will cycle troponin every 6 hours 3. 3. Recurrent fall- will get PT OT evaluation 4. Dementia- stable, no behavior disturbance. 5. Hypertension-blood pressure stable, continue amlodipine 6. Acute kidney injury- patient's creatinine today 1.5, baseline creatinine around 1.29. We will hold Lasix at this time due to renal insufficiency. Start gentle IV hydration with normal saline.   DVT Prophylaxis-   Lovenox   AM Labs Ordered, also please review Full Orders  Family Communication: Admission, patients condition and plan of care including tests being ordered have been discussed with the patient's wife  who indicate understanding and agree with the plan and Code Status.  Code Status:  Full code  Admission status: Observation    Time spent in minutes : 60 minutes   LAMA,GAGAN S M.D on 11/08/2016 at 4:50 PM  Between 7am to 7pm - Pager - [phone]. After 7pm go to [email or web address] - password [facility]  [facility] - Office  [phone]

## 2016-11-09 DIAGNOSIS — Z7982 Long term (current) use of aspirin: Secondary | ICD-10-CM | POA: Diagnosis not present

## 2016-11-09 DIAGNOSIS — F0281 Dementia in other diseases classified elsewhere with behavioral disturbance: Secondary | ICD-10-CM | POA: Diagnosis present

## 2016-11-09 DIAGNOSIS — I82622 Acute embolism and thrombosis of deep veins of left upper extremity: Secondary | ICD-10-CM | POA: Diagnosis not present

## 2016-11-09 DIAGNOSIS — S32402A Unspecified fracture of left acetabulum, initial encounter for closed fracture: Secondary | ICD-10-CM | POA: Diagnosis present

## 2016-11-09 DIAGNOSIS — S32415A Nondisplaced fracture of anterior wall of left acetabulum, initial encounter for closed fracture: Secondary | ICD-10-CM | POA: Diagnosis not present

## 2016-11-09 DIAGNOSIS — R296 Repeated falls: Secondary | ICD-10-CM | POA: Diagnosis present

## 2016-11-09 DIAGNOSIS — Z87891 Personal history of nicotine dependence: Secondary | ICD-10-CM | POA: Diagnosis not present

## 2016-11-09 DIAGNOSIS — M25552 Pain in left hip: Secondary | ICD-10-CM | POA: Diagnosis not present

## 2016-11-09 DIAGNOSIS — Z85820 Personal history of malignant melanoma of skin: Secondary | ICD-10-CM | POA: Diagnosis not present

## 2016-11-09 DIAGNOSIS — I340 Nonrheumatic mitral (valve) insufficiency: Secondary | ICD-10-CM | POA: Diagnosis present

## 2016-11-09 DIAGNOSIS — Z79899 Other long term (current) drug therapy: Secondary | ICD-10-CM | POA: Diagnosis not present

## 2016-11-09 DIAGNOSIS — S32592A Other specified fracture of left pubis, initial encounter for closed fracture: Secondary | ICD-10-CM | POA: Diagnosis present

## 2016-11-09 DIAGNOSIS — I130 Hypertensive heart and chronic kidney disease with heart failure and stage 1 through stage 4 chronic kidney disease, or unspecified chronic kidney disease: Secondary | ICD-10-CM | POA: Diagnosis present

## 2016-11-09 DIAGNOSIS — G309 Alzheimer's disease, unspecified: Secondary | ICD-10-CM | POA: Diagnosis present

## 2016-11-09 DIAGNOSIS — Z950 Presence of cardiac pacemaker: Secondary | ICD-10-CM | POA: Diagnosis not present

## 2016-11-09 DIAGNOSIS — Y92007 Garden or yard of unspecified non-institutional (private) residence as the place of occurrence of the external cause: Secondary | ICD-10-CM | POA: Diagnosis not present

## 2016-11-09 DIAGNOSIS — N179 Acute kidney failure, unspecified: Secondary | ICD-10-CM | POA: Diagnosis present

## 2016-11-09 DIAGNOSIS — I82A12 Acute embolism and thrombosis of left axillary vein: Secondary | ICD-10-CM | POA: Diagnosis not present

## 2016-11-09 DIAGNOSIS — Z7401 Bed confinement status: Secondary | ICD-10-CM | POA: Diagnosis not present

## 2016-11-09 DIAGNOSIS — R778 Other specified abnormalities of plasma proteins: Secondary | ICD-10-CM | POA: Diagnosis present

## 2016-11-09 DIAGNOSIS — N183 Chronic kidney disease, stage 3 (moderate): Secondary | ICD-10-CM | POA: Diagnosis present

## 2016-11-09 DIAGNOSIS — W19XXXA Unspecified fall, initial encounter: Secondary | ICD-10-CM | POA: Diagnosis present

## 2016-11-09 DIAGNOSIS — I428 Other cardiomyopathies: Secondary | ICD-10-CM | POA: Diagnosis present

## 2016-11-09 DIAGNOSIS — I4891 Unspecified atrial fibrillation: Secondary | ICD-10-CM | POA: Diagnosis present

## 2016-11-09 DIAGNOSIS — H409 Unspecified glaucoma: Secondary | ICD-10-CM | POA: Diagnosis present

## 2016-11-09 DIAGNOSIS — Z7901 Long term (current) use of anticoagulants: Secondary | ICD-10-CM | POA: Diagnosis not present

## 2016-11-09 DIAGNOSIS — I5042 Chronic combined systolic (congestive) and diastolic (congestive) heart failure: Secondary | ICD-10-CM | POA: Diagnosis present

## 2016-11-09 DIAGNOSIS — M7989 Other specified soft tissue disorders: Secondary | ICD-10-CM | POA: Diagnosis not present

## 2016-11-09 DIAGNOSIS — R279 Unspecified lack of coordination: Secondary | ICD-10-CM | POA: Diagnosis not present

## 2016-11-09 LAB — COMPREHENSIVE METABOLIC PANEL
ALT: 30 U/L (ref 17–63)
AST: 62 U/L — ABNORMAL HIGH (ref 15–41)
Albumin: 2.8 g/dL — ABNORMAL LOW (ref 3.5–5.0)
Alkaline Phosphatase: 67 U/L (ref 38–126)
Anion gap: 10 (ref 5–15)
BILIRUBIN TOTAL: 2.4 mg/dL — AB (ref 0.3–1.2)
BUN: 48 mg/dL — ABNORMAL HIGH (ref 6–20)
CHLORIDE: 110 mmol/L (ref 101–111)
CO2: 22 mmol/L (ref 22–32)
Calcium: 8.4 mg/dL — ABNORMAL LOW (ref 8.9–10.3)
Creatinine, Ser: 1.6 mg/dL — ABNORMAL HIGH (ref 0.61–1.24)
GFR, EST AFRICAN AMERICAN: 43 mL/min — AB (ref >60)
GFR, EST NON AFRICAN AMERICAN: 37 mL/min — AB (ref >60)
Glucose, Bld: 91 mg/dL (ref 65–99)
POTASSIUM: 4.6 mmol/L (ref 3.5–5.1)
Sodium: 142 mmol/L (ref 135–145)
TOTAL PROTEIN: 5.8 g/dL — AB (ref 6.5–8.1)

## 2016-11-09 LAB — CBC
HEMATOCRIT: 31.2 % — AB (ref 39.0–52.0)
Hemoglobin: 10.3 g/dL — ABNORMAL LOW (ref 13.0–17.0)
MCH: 32.1 pg (ref 26.0–34.0)
MCHC: 33 g/dL (ref 30.0–36.0)
MCV: 97.2 fL (ref 78.0–100.0)
PLATELETS: 110 10*3/uL — AB (ref 150–400)
RBC: 3.21 MIL/uL — ABNORMAL LOW (ref 4.22–5.81)
RDW: 17.7 % — AB (ref 11.5–15.5)
WBC: 6.6 10*3/uL (ref 4.0–10.5)

## 2016-11-09 MED ORDER — ACETAMINOPHEN 500 MG PO TABS: 500.0000 mg | ORAL_TABLET | ORAL | Status: DC | PRN

## 2016-11-09 NOTE — Care Management Note (Signed)
Case Management Note  Patient Details  Name: [name] MRN: [id number] Date of Birth: August 03, 1926  Subjective/Objective:     Patient adm from home with hip fracture. He will need HH PT, RN at time of discharge. He will also need hospital bed and 3 in 1. Son will be moving patient to [place] to care for him 24/7. Offered choice of HH/DME agencies.                Action/Plan: Romualdo Bolk of [facility] notified and will obtain orders from chart. Per family, patient will DC tomorrow. Son would like DME delivered after patient is discharged. [facility] aware and numbers/address given to [facility] rep.    Expected Discharge Date:     11/10/2016             Expected Discharge Plan:  [place]  In-House Referral:  Clinical Social Work  Discharge planning Services  CM Consult  Post Acute Care Choice:  Home Health Choice offered to:  Adult Children  DME Arranged:  Hospital bed, 3-N-1 DME Agency:  [place] Arranged:  RN, PT [facility] Agency:  [place]  Status of Service:  Completed, signed off  If discussed at [place] of Stay Meetings, dates discussed:    Additional Comments:  Jaice Digioia, Chauncey Reading, RN 11/09/2016, 2:33 PM

## 2016-11-09 NOTE — Care Management Obs Status (Signed)
[place] NOTIFICATION   Patient Details  Name: [name] MRN: [id number] Date of Birth: 1925-11-26   Medicare Observation Status Notification Given:  Yes    Amin Fornwalt, Chauncey Reading, RN 11/09/2016, 2:45 PM

## 2016-11-09 NOTE — Consult Note (Addendum)
CONSULT  REQUESTED BY DR Patsey Berthold  REASON: LEFT PELVIC/ACETABULAR FRACTURE   83 year old male fell at his home he has some dementia. He injured his left hip and pelvis presents complaining of pain over the left hip and groin which is a dull ache, cannot tell severity because of dementia timing now 48 hours seems to be constant in modified by movement     Review of Systems  Unable to perform ROS: Dementia   Past Medical History:  Diagnosis Date  . A-fib ([place])   . Alzheimer disease   . Arthritis   . Asymptomatic carotid artery stenosis    BILATERAL MILD ICA---  <50% PER DUPLEX 03-08-2008  . BPH (benign prostatic hypertrophy)   . Cancer ([place])   . Cardiac pacemaker    CARDIOLOGIST-- DR Cristopher Peru (LAST PACE [facility] 05-15-2013)  . CHB (complete heart block) ([place])   . Chronic back pain   . Chronic pancreatitis ([place])    Based on CT findings  . Chronic systolic heart failure ([place])   . Costochondritis, acute   . Dementia   . Erectile dysfunction   . Glaucoma   . History of melanoma in situ    JAN 2012--  LEFT HEEL W/ SLN BX  . History of syncope   . HOH (hard of hearing)   . Hypertension   . IC (interstitial cystitis)   . Nonischemic cardiomyopathy ([place])   . Pancytopenia   . Poor historian   . Rash and other nonspecific skin eruption   . Severe mitral regurgitation   . Urge urinary incontinence   . Visual changes    Past Surgical History:  Procedure Laterality Date  . BACK SURGERY  X3  LAST ONE'90's  . CATARACT EXTRACTION, BILATERAL  left  1997/   right 2003  . COLONOSCOPY  08/18/2006   [id number] granularity and erosions of the rectum of uncertain significance biopsied.  A long redundant, but otherwise normal-appearing colon.melanosi coli and minimal inflammation on bx  . COLONOSCOPY N/A 08/05/2014   Procedure: COLONOSCOPY;  Surgeon: [name], MD;  [facility];  Service: Endoscopy;  Laterality: N/A;  PT NEEDS TCS AT 1000.  Marland Kitchen CYSTO/ HOD/  REPLACEMENT  INTERSTIM IMPLANT  05-14-2003  . CYSTOSCOPY W/ RETROGRADES N/A 02/11/2016   Procedure: CYSTOSCOPY WITH RETROGRADE PYELOGRAM;  Surgeon: Cleon Gustin, MD;  Location: [facility] ORS;  Service: Urology;  Laterality: N/A;  . ESOPHAGOGASTRODUODENOSCOPY N/A 03/28/2014   Dr. [name]: normal esophagus s/p dilation, mosaic appearance - chronic inflammation noted on bx  . IMPLANTABLE CARDIOVERTER DEFIBRILLATOR (ICD) GENERATOR CHANGE N/A 10/11/2014   Procedure: ICD GENERATOR CHANGE;  Surgeon: [name], MD;  Location: [facility] CATH LAB;  Service: Cardiovascular;  Laterality: N/A;  . INGUINAL HERNIA REPAIR Bilateral AS TEEN  . INTERSTIM IMPLANT PLACEMENT  2001  &  2007  . INTERSTIM IMPLANT REVISION N/A 08/28/2013   Procedure: REPLACEMENT OF INTERSTIM-GENERATOR AND LEAD ;  Surgeon: [name], MD;  Location: [facility];  Service: Urology;  Laterality: N/A;  . LEFT ELBOW SURGERY  2002  . MALONEY DILATION N/A 03/28/2014   Procedure: Venia Minks DILATION;  Surgeon: [name], MD;  [facility];  Service: Endoscopy;  Laterality: N/A;  . PACEMAKER INSERTION  12-06-2008  DR [name]   BiV PPM  --  [employer]  . REMOVAL INTERSTIM IMPLANT/  CYST/  HOD  04-14-2004  . REVISION INTERSTIM IMPLANT AND REPLACE GENERATOR  05-15-2009   left upper buttock for  urge urinary incontinence  . SAVORY DILATION N/A 03/28/2014   Procedure: SAVORY DILATION;  Surgeon: [name], MD;  [facility];  Service: Endoscopy;  Laterality: N/A;  . TONSILLECTOMY  1950  . TRANSTHORACIC ECHOCARDIOGRAM  12-06-2008   LVSF 55-60%/  MODERATE MR/  MILD AR/  QUESTION DISTAL POSTERIOR HYPOKINESIS  . TRANSURETHRAL RESECTION OF BLADDER TUMOR WITH GYRUS (TURBT-GYRUS) N/A 02/11/2016   Procedure: TRANSURETHRAL RESECTION OF BLADDER TUMOR WITH GYRUS (TURBT-GYRUS);  Surgeon: Cleon Gustin, MD;  Location: [facility];  Service: Urology;  Laterality: N/A;  . WIDE EXCISION LEFT HEEL AND SLN BX  JUNE 2012   Social  History  Substance Use Topics  . Smoking status: Former Smoker    Packs/day: 0.02    Years: 30.00    Types: Cigarettes    Quit date: 08/24/1997  . Smokeless tobacco: Never Used  . Alcohol use No    BP 108/68 (BP Location: Left Arm)   Pulse 63   Temp 98.3 F (36.8 C) (Oral)   Resp 18   Ht 5\' 10"  (1.778 m)   Wt 169 lb 15.6 oz (77.1 kg)   SpO2 92%   BMI 24.39 kg/m  The patient is awake alert but disoriented to place and time  His appearance normal well kept  Mood flat affect flat  Gait cannot walk because of the fracture  Upper extremities show no malalignment contracture subluxation atrophy tremor.  Right lower extremity normal alignment range of motion stability strength skin pulse and sensation  Left side is in slight external rotation and painful to move the joints look stable muscle tone normal skin is intact pulses are good sensation is normal  X-rays show no fracture on the plain film but the CT scan shows a pelvic fracture and acetabular fracture  Impression left-sided pelvic fracture  Impression acetabular fracture  Plan weightbearing as tolerated on the left with a walker for 6 weeks  X-rays at 6 and 12 weeks   Medicare new patient consult [id number]/ Patient will need nursing home placement for skilled care  Family member, son is trained as a CNA and a nurse and wishes to take the patient home with appropriate services as required and I'm in agreement.

## 2016-11-09 NOTE — Discharge Summary (Signed)
Physician Discharge Summary  [name] [id number] DOB: November 13, 1925 DOA: 11/08/2016  PCP: [name], MD  Admit date: 11/08/2016 Discharge date: 11/09/2016  Time spent: 35 minutes  Recommendations for Outpatient Follow-up:  1. Recommend home health PT and RN and potentially may benefit from home hospice if fails to thrive 2. Equipment ordered for home includes hospital bed, 3 and 1, shower chair 3. Would not use narcotics for pain control rather would use nonsteroidals and over the counters for now and if needed  Discharge Diagnoses:  Active Problems:   Closed fracture of acetabulum ([place])   Pelvic fracture [facility])   Discharge Condition: poor  Diet recommendation: reg  Filed Weights   11/08/16 1108 11/08/16 1905  Weight: 79.4 kg (175 lb) 77.1 kg (169 lb 15.6 oz)    History of present illness:  83 year old male History of chronic systolic heart EF [id number] 2 NICM HTN Atrial fibrillation [place]i score >5 not on [facility]  for this secondary to hematoma of flank 02/2011 Severe mitral regurg Biventricular PPM placed for Mobitz with 2:1-medtronic placed march 2012 Dementia--documented confusion about meds Chronic pancytopenia since 2007prev follwed at [facility] History of recurrent falls Melanoma L heel s/p excision 2011 Prior bladder stimulator Dementia with behavioral disturbance Colonoscopy 2007 granularity/erosions--refused subsequent ones.  EGD 03/2014 neg-referred ENT--rpt COLO on admit 08/2014--diffuclt to perform poor prep-couldn't reach cecum  Admitted under observation for pain control 11/08/16 with fall onto left hip and noted acetabular and inferior pubic ramus fracture on workup  Patient was evaluated and was felt to physical therapy that patient was relatively stable His fractures and nonoperative and patient may benefit from discussion with outpatient home health as well as with coordination with primary care physician regarding his pacemaker and regarding his bladder stimulator  whether these can be turned off if he does indeed decline Note that he has  moderate dementia and will be returning to his home in [place] under supervision of his son  I have discontinued some of his nonessential medications including vitamins and has simplified his diuretics discontinue his metolazone and his potassium replacement  It would be beneficial if primary care physician could coordinate with the son regarding goals of care going forward as it looks like Dr. Moshe Cipro has in the past recommended hospice care--cc on this note    Discharge Exam: Vitals:   11/09/16 1054 11/09/16 1420  BP: 120/74 111/75  Pulse:  61  Resp:  18  Temp:      General: alert pleasantley confused No overt pain Cardiovascular: s1 s 2 no m/r/g Respiratory: clear  Discharge Instructions   Discharge Instructions    Diet - low sodium heart healthy    Complete by:  As directed    For home use only DME Hospital bed    Complete by:  As directed    The above medical condition requires:  Patient requires the ability to reposition frequently   Head must be elevated greater than:  45 degrees   Bed type:  Semi-electric   Trapeze Bar:  Yes   Support Surface:  Gel Overlay   Increase activity slowly    Complete by:  As directed      Current Discharge Medication List    CONTINUE these medications which have NOT CHANGED   Details  acetaminophen (TYLENOL) 650 MG CR tablet Take 650 mg by mouth every 8 (eight) hours as needed for pain.     amLODipine (NORVASC) 2.5 MG tablet TAKE 1 TABLET (2.5 MG TOTAL) BY MOUTH DAILY.  Qty: 30 tablet, Refills: 1    aspirin EC 81 MG EC tablet Take 1 tablet (81 mg total) by mouth daily.    busPIRone (BUSPAR) 5 MG tablet TAKE 1 TABLET BY MOUTH EVERY DAY AS NEEDED FOR ANXIETY Qty: 90 tablet, Refills: 1   Associated Diagnoses: Generalized anxiety disorder    diclofenac sodium (VOLTAREN) 1 % GEL Apply 2 g topically daily as needed (pain). Reported on 01/05/2016     dorzolamide (TRUSOPT) 2 % ophthalmic solution Place 1 drop into both eyes daily. One drop both eyes     ENSURE (ENSURE) Take 237 mLs by mouth daily. Pt takes daily     furosemide (LASIX) 20 MG tablet Take 20 mg by mouth daily.    latanoprost (XALATAN) 0.005 % ophthalmic solution Place 1 drop into both eyes at bedtime. Both eyes     Meth-Hyo-M Bl-Na Phos-Ph Sal (URIBEL) 118 MG CAPS Take 1 capsule (118 mg total) by mouth 2 (two) times daily as needed (bladder). Qty: 30 capsule, Refills: 0    Carboxymethylcellul-Glycerin 0.5-0.9 % SOLN Apply 1 drop to eye daily. One drop both eyes     ibuprofen (ADVIL,MOTRIN) 200 MG tablet Take 400 mg by mouth every 6 (six) hours as needed for moderate pain.    polyethylene glycol powder (GLYCOLAX/MIRALAX) powder Take 17 g by mouth daily. Qty: 3350 g, Refills: 1      STOP taking these medications     KLOR-CON M20 20 MEQ tablet      metolazone (ZAROXOLYN) 2.5 MG tablet      Multiple Vitamins-Minerals (CENTRUM SILVER 50+MEN) TABS            The results of significant diagnostics from this hospitalization (including imaging, microbiology, ancillary and laboratory) are listed below for reference.    Significant Diagnostic Studies: Dg Chest 2 View  Result Date: 11/08/2016 CLINICAL DATA:  Less post fall last night in reports left hip pain. Patient has history of atrial fibrillation, myelodysplasia, former smoker. EXAM: CHEST  2 VIEW COMPARISON:  PA and lateral chest x-ray dated September 16, 2016. FINDINGS: The lungs are well-expanded. There is no focal infiltrate. There is no pleural effusion. The cardiac silhouette remains enlarged. The central pulmonary vascularity is prominent. There is minimal interstitial prominence that is stable and may reflect chronic bronchitic change or low-grade compensated CHF. The ICD is in stable position. There is calcification in the wall of the aortic arch. There is no pleural effusion. IMPRESSION: Probable low-grade  compensated CHF. No acute cardiopulmonary abnormality. Thoracic aortic atherosclerosis. Electronically Signed   By: David  Martinique M.D.   On: 11/08/2016 12:27   Ct Head Wo Contrast  Result Date: 11/08/2016 CLINICAL DATA:  Fall. EXAM: CT HEAD WITHOUT CONTRAST TECHNIQUE: Contiguous axial images were obtained from the base of the skull through the vertex without intravenous contrast. COMPARISON:  CT scan of Feb 19, 2016. FINDINGS: Brain: Mild diffuse cortical atrophy is noted. Minimal chronic ischemic white matter disease is noted. No mass effect or midline shift is noted. Ventricular size is within normal limits. There is no evidence of mass lesion, hemorrhage or acute infarction. Vascular: No hyperdense vessel or unexpected calcification. Skull: Normal. Negative for fracture or focal lesion. Sinuses/Orbits: No acute finding. Other: None. IMPRESSION: Mild diffuse cortical atrophy. Minimal chronic ischemic white matter disease. No acute intracranial abnormality seen. Electronically Signed   By: Marijo Conception, M.D.   On: 11/08/2016 12:30   Ct Hip Left Wo Contrast  Result Date: 11/08/2016 CLINICAL DATA:  Left hip pain secondary to a fall last night. Abnormal radiographs. EXAM: CT OF THE LEFT HIP WITHOUT CONTRAST TECHNIQUE: Multidetector CT imaging of the left hip was performed according to the standard protocol. Multiplanar CT image reconstructions were also generated. COMPARISON:  Radiographs dated 11/08/2016 FINDINGS: Bones/Joint/Cartilage There is a slightly displaced fracture of the left inferior pubic ramus. There is a comminuted fracture of the medial wall of the left acetabulum extending superiorly into the left ilium. There is suggestion of a fracture of the left sacral ala on image 1 of series 2 but this is not definitive. Proximal left femur appears intact.  Left hip hemarthrosis. IMPRESSION: 1. Fracture of the medial wall of the left acetabulum extending superiorly. 2. Fracture of the left inferior  pubic ramus. 3. Left hip hemarthrosis. 4. Possible subtle impaction fracture of the left sacral ala. Electronically Signed   By: Lorriane Shire M.D.   On: 11/08/2016 14:28   Dg Hip Unilat W Or Wo Pelvis 2-3 Views Left  Result Date: 11/08/2016 CLINICAL DATA:  Status post fall last night with pain in the left hip. History of Milo dysplasia, chronic CHF. EXAM: DG HIP (WITH OR WITHOUT PELVIS) 2-3V LEFT COMPARISON:  Left hip series dated August 01, 2014 FINDINGS: The bones are subjectively osteopenic. There is deformity of the inferior pubic ramus on the left which has appeared since October 2015. This may be acute or chronic. No definite superior pubic ramus fracture is observed but a fracture involving the lateral aspect of the superior pubic ramus with its junction with the ischium may be present. There is moderate symmetric narrowing of the left hip joint space. The femoral head and acetabulum appear intact. The femoral neck, intertrochanteric, and subtrochanteric regions are normal. IMPRESSION: Deformity of the inferior pubic ramus worrisome former for fracture which is likely acute. A definite superior pubic ramus fracture on the left is not clearly identified but may be present laterally. No acute left hip fracture. Mild symmetric narrowing of the left hip joint space. Electronically Signed   By: David  Martinique M.D.   On: 11/08/2016 12:25    Microbiology: No results found for this or any previous visit (from the past 240 hour(s)).   Labs: Basic Metabolic Panel:  Recent Labs Lab 11/08/16 1232 11/09/16 0533  NA 143 142  K 4.7 4.6  CL 109 110  CO2 25 22  GLUCOSE 89 91  BUN 42* 48*  CREATININE 1.59* 1.60*  CALCIUM 8.7* 8.4*   Liver Function Tests:  Recent Labs Lab 11/08/16 1232 11/09/16 0533  AST 61* 62*  ALT 31 30  ALKPHOS 74 67  BILITOT 3.2* 2.4*  PROT 6.9 5.8*  ALBUMIN 3.3* 2.8*   No results for input(s): LIPASE, AMYLASE in the last 168 hours. No results for input(s): AMMONIA  in the last 168 hours. CBC:  Recent Labs Lab 11/08/16 1232 11/09/16 0533  WBC 5.5 6.6  NEUTROABS 4.9  --   HGB 10.4* 10.3*  HCT 31.7* 31.2*  MCV 97.5 97.2  PLT 101* 110*   Cardiac Enzymes:  Recent Labs Lab 11/08/16 1232  CKTOTAL 794*  TROPONINI 0.42*   BNP: BNP (last 3 results)  Recent Labs  05/10/16 1156 06/28/16 1327 08/19/16 1549  BNP 1,122.3* 723.0* 1,250.0*    ProBNP (last 3 results) No results for input(s): PROBNP in the last 8760 hours.  CBG: No results for input(s): GLUCAP in the last 168 hours.     Signed:  Nita Sells MD   [facility]  11/09/2016, 4:55 PM

## 2016-11-09 NOTE — Progress Notes (Signed)
[name] [id number] DOB: 05/13/1926 DOA: 11/08/2016 PCP: [name], MD  Brief narrative: 83 year old male History of chronic systolic heart EF A999333 2 NICM HTN Atrial fibrillation [place] score >5 not on [facility]  for this secondary to hematoma of flank 02/2011 Severe mitral regurg Biventricular PPM placed for Mobitz with 2:1-medtronic placed march 2012 Dementia--documented confusion about meds Chronic pancytopenia since 2007prev follwed at [facility] History of recurrent falls Melanoma L heel s/p excision 2011 Prior bladder stimulator Dementia with behavioral disturbance Colonoscopy 2007 granularity/erosions--refused subsequent ones.  EGD 03/2014 neg-referred ENT--rpt COLO on admit 08/2014--diffuclt to perform poor prep-couldn't reach cecum  Admitted under observation for pain control 11/08/16 with fall onto left hip and noted acetabular and inferior pubic ramus fracture on workup   Past medical history-As per Problem list Chart reviewed as below-   Consultants:  Orthopedics  Procedures:  CT scans and x-rays of hip  Antibiotics:  None currently   Subjective  Patient's sleepy but arousable not in any overt pain Son at the bedside tells me has had a fall in December 2000 17 prior to this fall Apparently was on the floor for 18 hours prior to being brought to the hospital Patient lives with current wife  Son is available take care of patient 24 7   Objective    Interim History:   Telemetry: Non tele   Objective: Vitals:   11/08/16 1846 11/08/16 1905 11/08/16 2105 11/09/16 0601  BP: 124/91 125/87 123/86 108/68  Pulse: 70 69 72 63  Resp: 18 18 18 18   Temp:  97.7 F (36.5 C) 97.9 F (36.6 C) 98.3 F (36.8 C)  TempSrc:  Oral Oral Oral  SpO2: 98% 97% 98% 92%  Weight:  77.1 kg (169 lb 15.6 oz)    Height:  5\' 10"  (1.778 m)      Intake/Output Summary (Last 24 hours) at 11/09/16 0849 Last data filed at 11/09/16 0000  Gross per 24 hour  Intake              1160 ml  Output              200 ml  Net              960 ml    Exam:  General: eomi ncat Cardiovascular:  s1 s 2no m/r/g Respiratory:  clear with no added sound Abdomen: Soft nontender nondistended Skin no lower extremity edema Neuro intact and can repeat had just prior with nursing  Data Reviewed: Basic Metabolic Panel:  Recent Labs Lab 11/08/16 1232 11/09/16 0533  NA 143 142  K 4.7 4.6  CL 109 110  CO2 25 22  GLUCOSE 89 91  BUN 42* 48*  CREATININE 1.59* 1.60*  CALCIUM 8.7* 8.4*   Liver Function Tests:  Recent Labs Lab 11/08/16 1232 11/09/16 0533  AST 61* 62*  ALT 31 30  ALKPHOS 74 67  BILITOT 3.2* 2.4*  PROT 6.9 5.8*  ALBUMIN 3.3* 2.8*   No results for input(s): LIPASE, AMYLASE in the last 168 hours. No results for input(s): AMMONIA in the last 168 hours. CBC:  Recent Labs Lab 11/08/16 1232 11/09/16 0533  WBC 5.5 6.6  NEUTROABS 4.9  --   HGB 10.4* 10.3*  HCT 31.7* 31.2*  MCV 97.5 97.2  PLT 101* 110*   Cardiac Enzymes:  Recent Labs Lab 11/08/16 1232  CKTOTAL 794*  TROPONINI 0.42*   BNP: Invalid input(s): POCBNP CBG: No results for input(s): GLUCAP in the last 168 hours.  No results found for this or any previous visit (from the past 240 hour(s)).   Studies:              All Imaging reviewed and is as per above notation   Scheduled Meds: . amLODipine  2.5 mg Oral Daily  . aspirin EC  81 mg Oral Daily  . dorzolamide  1 drop Both Eyes Daily  . enoxaparin (LOVENOX) injection  40 mg Subcutaneous Q24H  . latanoprost  1 drop Both Eyes QHS   Continuous Infusions: . sodium chloride 75 mL/hr at 11/08/16 2300     Assessment/Plan:  1. Left hip and pelvis pain-up with therapy. Evaluate and see if patient would he able to benefit from therapy as an outpatient. As mentioned to the family in the past patient has been recommended hospice and I'm unclear with his level of dementia and recurrent falls that he would be a good candidate for  anything more than restorative therapy. He may benefit more from outpatient hospice and may obtain more one-to-one benefit with hospice support in place and I discussed this clearly with the family.  He has not required much pain medications IV but my suspicion is he will not be moving much subsequent to this fall and may be chronically bedbound subsequently 2. afib Mali score4-5, not on AC/Mobitz type II status post pacemaker 2010 with revision 2013-does not require telemetry. Monitor--cont ASA 81 daily 3. Chronic kidney disease stage 3-keep volumes even. At holding Lasix 20 daily, metolazone 2.5 4. Compensated nonischemic cardiomyopathy/chronic diastolic heart failure--holding diuretics as above continue amlodipine 2.5. Historically has poor tolerance to beta blocker for medications 5. Prior bladder interstimulator-followed by urologist Dr. Jesusita Oka 6. Mod-severe dementia with behavioural dist-holding Buspar 7. Chr Pancytopenia-followed previously at [facility]--has not been seen there since 2012 8. Prior GI bleed 08/2014, prior dysphagia-currently stable 9. Severe mitral regurg-currently stable--- might need resumption of low-dose of diuretic as an outpatient.  Note that we cannot obtain orthostatic given for   Long discussion with family -they're debating whether we should change CODE STATUS and patient might and affect more from hospice home services as the son is  We will continue just discussion once therapy has evaluated the patient so that they can make a decision about what can be done   expect can discharge in the next 1-2 days  Verneita Griffes, MD  [facility] Pager [phone] 11/09/2016, 8:49 AM    LOS: 0 days

## 2016-11-09 NOTE — Evaluation (Addendum)
Physical Therapy Evaluation Patient Details Name: [name] MRN: [id number] DOB: 11/22/1925 Today's Date: 11/09/2016   History of Present Illness  83 y.o. male, With history of Alzheimer disease, who was brought to the ED after patient fell at home. He was found for by wife after a fall last night around 8 PM. As the patient's wife he went outside last night to get some work for that [employer] and he fell he was unable to get up. She had to call her daughter to bring him inside. She put him in a chair from which he stated out in the night. Patient was unable to walk this morning.   CT scan shows a L pelvic fracture and acetabular fracture.  Per Ortho - plan weightbearing as tolerated on the left with a walker for 6 weeks  Clinical Impression  Pt received in bed, son present, and pt is agreeable to PT evaluation.  Pt attempt to answer questions, however becomes fatigued, and falls asleep during questioning.  Son answers most of PLOF questions.  He is normally ambulates independently, and occasionally uses a RW.  He normally [place] house, however son states that the pt will be d/c to the pt's other house in [place], where the son will provide 24/7 supervision/assistance.  During PT evaluation, he required Max A for supine<>sit, and Max/Total A for sit<>stand with RW.  He was unable to weight shift while standing, and therefore, is not able to t/f to a chair at this point.  He was assisted back into the bed with Total A +2.  He is recommended for [facility] bed, BSC, w/c, and w/c cushion, and shower chair.  This PT is also recommending a hoyer lift, however the son adamantly refuses this.  Son states he wants to have hospice for his father.      Follow Up Recommendations Home health PT;Other (comment) (Son expressed wanting Hospice)    Equipment Recommendations  3in1 (PT);Hospital bed; w/c, and w/c cushion, Other (comment) (shower chair, PT also recommends hoyer  lift, however son adamantly expresses that he does not want one. )    Recommendations for Other Services       Precautions / Restrictions Precautions Precautions: Fall Precaution Comments: multiple falls Restrictions Weight Bearing Restrictions: No      Mobility  Bed Mobility Overal bed mobility: Needs Assistance Bed Mobility: Supine to Sit;Sit to Supine     Supine to sit: Max assist;Total assist;HOB elevated Sit to supine: Total assist;+2 for physical assistance   General bed mobility comments: Pt required assist with use of bed pad to assist hips to EOB.    Transfers Overall transfer level: Needs assistance Equipment used: Rolling walker (2 wheeled) Transfers: Sit to/from Stand Sit to Stand: Max assist;From elevated surface         General transfer comment: Pt required increased time to inch by inch extend his LE to come into full upright standing position.  Pt demonstrates posterior lean in standing, and is unable to perform any weight shifting to initiate transfer to a chair.   Ambulation/Gait Ambulation/Gait assistance:  (NA due to difficulty with sit<>stand and inability for weight shifting )              Stairs            Wheelchair Mobility    Modified Rankin (Stroke Patients Only)       Balance Overall balance assessment: History of Falls;Needs assistance Sitting-balance support: Bilateral  upper extremity supported;Feet supported Sitting balance-Leahy Scale: Fair Sitting balance - Comments: Slight posterior lean with sitting, but able to correct with tc's.     Standing balance support: Bilateral upper extremity supported Standing balance-Leahy Scale: Zero Standing balance comment: RW in front of pt, however, pt is not able to comprehend placing weight through UE and down through the RW.                               Pertinent Vitals/Pain Pain Assessment: Faces Faces Pain Scale: Hurts whole lot Pain Location: L LE Pain  Descriptors / Indicators: Grimacing;Guarding Pain Intervention(s): Limited activity within patient's tolerance;Monitored during session;Repositioned    Home Living Family/patient expects to be discharged to:: Private residence (his house in [place]. ) Living Arrangements: Children (son) Available Help at Discharge: Available 24 hours/day Type of Home: House Home Access: Stairs to enter   [employer] of Steps: 1 at [employer].   Home Layout: One level Home Equipment: Cane - single point;Walker - 2 wheels;Shower seat;Bedside Magazine features editor / Transfers Assistance Needed: Son states that he ambulates occasionally with RW.    ADL's / Homemaking Assistance Needed: family assists with dressing bathing.          Hand Dominance        Extremity/Trunk Assessment   Upper Extremity Assessment Upper Extremity Assessment: Generalized weakness    Lower Extremity Assessment Lower Extremity Assessment: Generalized weakness       Communication   Communication: HOH  Cognition Arousal/Alertness: Lethargic Behavior During Therapy: WFL for tasks assessed/performed Overall Cognitive Status: History of cognitive impairments - at baseline                      General Comments      Exercises     Assessment/Plan    PT Assessment All further PT needs can be met in the next venue of care  PT Problem List Decreased strength;Decreased activity tolerance;Decreased balance;Decreased mobility;Decreased coordination;Decreased cognition;Decreased knowledge of use of DME;Decreased safety awareness;Decreased knowledge of precautions;Cardiopulmonary status limiting activity;Pain;Obesity;Decreased skin integrity          PT Treatment Interventions      PT Goals (Current goals can be found in the Care Plan section)  Acute Rehab PT Goals Patient Stated Goal: Son wants pt to d/c home.  PT Goal Formulation: With patient Time For Goal Achievement:  11/16/16 Potential to Achieve Goals: Poor    Frequency     Barriers to discharge        Co-evaluation               End of Session Equipment Utilized During Treatment: Gait belt Activity Tolerance: Patient limited by pain Patient left: in bed;with call bell/phone within reach;with family/visitor present Nurse Communication: Mobility status;Need for lift equipment (RN notified of pt's mobiltiy status.  Recommend Maxi move/hoyer lift if he is to get OOB<>chair. )    Functional Assessment Tool Used: [employer]	[place] AM-PAC "6-clicks"  Functional Limitation: Mobility: Walking and moving around Mobility: Walking and Moving Around Current Status [phone]): At least 80 percent but less than 100 percent impaired, limited or restricted Mobility: Walking and Moving Around Goal Status [phone]): At least 60 percent but less than 80 percent impaired, limited or restricted    Time: 4166-0630 PT Time Calculation (min) (ACUTE ONLY): 34 min   Charges:   PT Evaluation $  PT Eval Low Complexity: 1 Procedure PT Treatments $Therapeutic Activity: 8-22 mins   PT G Codes:   PT G-Codes **NOT FOR INPATIENT CLASS** Functional Assessment Tool Used: [employer] "6-clicks"  Functional Limitation: Mobility: Walking and moving around Mobility: Walking and Moving Around Current Status [phone]): At least 80 percent but less than 100 percent impaired, limited or restricted Mobility: Walking and Moving Around Goal Status [phone]): At least 60 percent but less than 80 percent impaired, limited or restricted    Beth Shaylee Stanislawski, PT, DPT X: [phone]

## 2016-11-10 ENCOUNTER — Inpatient Hospital Stay (HOSPITAL_COMMUNITY): Payer: Medicare Other

## 2016-11-10 DIAGNOSIS — I82A12 Acute embolism and thrombosis of left axillary vein: Secondary | ICD-10-CM

## 2016-11-10 DIAGNOSIS — S32415A Nondisplaced fracture of anterior wall of left acetabulum, initial encounter for closed fracture: Secondary | ICD-10-CM

## 2016-11-10 LAB — MRSA PCR SCREENING: MRSA by PCR: POSITIVE — AB

## 2016-11-10 MED ORDER — WARFARIN - PHARMACIST DOSING INPATIENT
Status: DC
  Administered 2016-11-11: 16:00:00

## 2016-11-10 MED ORDER — ENOXAPARIN SODIUM 80 MG/0.8ML ~~LOC~~ SOLN
80.0000 mg | Freq: Two times a day (BID) | SUBCUTANEOUS | Status: DC
  Administered 2016-11-10 – 2016-11-11 (×2): 80 mg via SUBCUTANEOUS
  Filled 2016-11-10 (×2): qty 0.8

## 2016-11-10 MED ORDER — WARFARIN SODIUM 5 MG PO TABS
5.0000 mg | ORAL_TABLET | Freq: Once | ORAL | Status: AC
  Administered 2016-11-10: 5 mg via ORAL
  Filled 2016-11-10: qty 1

## 2016-11-10 NOTE — Progress Notes (Signed)
ANTICOAGULATION CONSULT NOTE - Initial Consult  Pharmacy Consult for Lovenox >> Warfarin Indication: VTE treatment  Patient Measurements: Height: 5\' 10"  (177.8 cm) Weight: 169 lb 15.6 oz (77.1 kg) IBW/kg (Calculated) : 73  Vital Signs: Temp: 99.2 F (37.3 C) (02/07 1650) Temp Source: Axillary (02/07 1650) BP: 89/61 (02/07 1650) Pulse Rate: 60 (02/07 0600)  Labs:  Recent Labs  11/08/16 1232 11/09/16 0533  HGB 10.4* 10.3*  HCT 31.7* 31.2*  PLT 101* 110*  CREATININE 1.59* 1.60*  CKTOTAL 794*  --   TROPONINI 0.42*  --    Estimated Creatinine Clearance: 33 mL/min (by C-G formula based on SCr of 1.6 mg/dL (H)).  Medical History: Past Medical History:  Diagnosis Date  . A-fib ([place])   . Alzheimer disease   . Arthritis   . Asymptomatic carotid artery stenosis    BILATERAL MILD ICA---  <50% PER DUPLEX 03-08-2008  . BPH (benign prostatic hypertrophy)   . Cancer ([place])   . Cardiac pacemaker    CARDIOLOGIST-- DR Cristopher Peru (LAST PACE [facility] 05-15-2013)  . CHB (complete heart block) ([place])   . Chronic back pain   . Chronic pancreatitis ([place])    Based on CT findings  . Chronic systolic heart failure ([place])   . Costochondritis, acute   . Dementia   . Erectile dysfunction   . Glaucoma   . History of melanoma in situ    JAN 2012--  LEFT HEEL W/ SLN BX  . History of syncope   . HOH (hard of hearing)   . Hypertension   . IC (interstitial cystitis)   . Nonischemic cardiomyopathy ([place])   . Pancytopenia   . Poor historian   . Rash and other nonspecific skin eruption   . Severe mitral regurgitation   . Urge urinary incontinence   . Visual changes    Medications:  Prescriptions Prior to Admission  Medication Sig Dispense Refill Last Dose  . acetaminophen (TYLENOL) 650 MG CR tablet Take 650 mg by mouth every 8 (eight) hours as needed for pain.    Taking  . amLODipine (NORVASC) 2.5 MG tablet TAKE 1 TABLET (2.5 MG TOTAL) BY MOUTH DAILY. 30 tablet 1   . aspirin EC 81 MG EC  tablet Take 1 tablet (81 mg total) by mouth daily.   Taking  . busPIRone (BUSPAR) 5 MG tablet TAKE 1 TABLET BY MOUTH EVERY DAY AS NEEDED FOR ANXIETY 90 tablet 1 Taking  . diclofenac sodium (VOLTAREN) 1 % GEL Apply 2 g topically daily as needed (pain). Reported on 01/05/2016   Taking  . dorzolamide (TRUSOPT) 2 % ophthalmic solution Place 1 drop into both eyes daily. One drop both eyes    Taking  . ENSURE (ENSURE) Take 237 mLs by mouth daily. Pt takes daily    Taking  . furosemide (LASIX) 20 MG tablet Take 20 mg by mouth daily.     Marland Kitchen KLOR-CON M20 20 MEQ tablet TAKE 1 TABLET (20 MEQ TOTAL) BY MOUTH DAILY. 30 tablet 0   . latanoprost (XALATAN) 0.005 % ophthalmic solution Place 1 drop into both eyes at bedtime. Both eyes    Taking  . Meth-Hyo-M Bl-Na Phos-Ph Sal (URIBEL) 118 MG CAPS Take 1 capsule (118 mg total) by mouth 2 (two) times daily as needed (bladder). 30 capsule 0 Taking  . Carboxymethylcellul-Glycerin 0.5-0.9 % SOLN Apply 1 drop to eye daily. One drop both eyes    Taking  . furosemide (LASIX) 20 MG tablet Take 1 tablet (20 mg total)  by mouth daily. May take an additional 20 mg in the afternoon AS NEEDED 90 tablet 0 Taking  . ibuprofen (ADVIL,MOTRIN) 200 MG tablet Take 400 mg by mouth every 6 (six) hours as needed for moderate pain.   Taking  . metolazone (ZAROXOLYN) 2.5 MG tablet Take 1 tablet today before your extra lasix pill this afternoon (Patient not taking: Reported on 09/09/2016) 1 tablet 0 Not Taking  . Multiple Vitamins-Minerals (CENTRUM SILVER 50+MEN) TABS Take 1 tablet by mouth daily. Daily    Taking  . polyethylene glycol powder (GLYCOLAX/MIRALAX) powder Take 17 g by mouth daily. (Patient taking differently: Take 17 g by mouth daily as needed for mild constipation. ) 3350 g 1 Taking   Assessment: 83yo male.  Asked to initiate Lovenox and Warfarin for VTE treatment.  Pt recently discharged from hospital.   Goal of Therapy:  INR 2-3 Monitor platelets by anticoagulation protocol:  Yes   Plan:  Lovenox 1mg /Kq SQ q12hrs until INR at goal Coumadin 5mg  today x 1 INR, CBC in am  Hart Robinsons A 11/10/2016,5:09 PM

## 2016-11-10 NOTE — Progress Notes (Signed)
PROGRESS NOTE    [name]  [id number] DOB: 04/07/1926 DOA: 11/08/2016 PCP: [name], MD    Brief Narrative:  83 year old male history of chronic systolic heart EF [id number] 2, NICM, HTN, atrial fibrillation [place] score >5 not on [facility] for this secondary to hematoma of flank 02/2011. Severe mitral regurg, Biventricular PPM placed for Mobitz with 2:1-medtronic placed march 2012, Dementia--documented confusion about meds, chronic pancytopenia since 2007prev follwed at [facility] history of recurrent falls, melanoma L heel s/p excision 2011, Prior bladder stimulator and dementia with behavioral disturbance Colonoscopy 2007 granularity/erosions--refused subsequent ones. EGD 03/2014 neg-referred ENT--rpt COLO on admit 08/2014--diffuclt to perform poor prep-couldn't reach cecum  Admitted under observation for pain control 11/08/16 with fall onto left hip and noted acetabular and inferior pubic ramus fracture on workup. Patient was evaluated and was felt to physical therapy that patient was relatively stable His fractures and nonoperative and patient may benefit from discussion with outpatient home health as well as with coordination with primary care physician regarding his pacemaker and regarding his bladder stimulator whether these can be turned off if he does indeed decline. Note that he has  moderate dementia and will be returning to his home in [place] under supervision of his son who is a Curator and retired Statistician.  Patient was almost ready for discharge on 2/7 but had significant left upper extremity swelling- U/S showed DVT and after discussion with patient's wife and son anticoagulation was started.  Risks and benefits explained prior to starting anticoagulation.  Assessment & Plan:   Active Problems:   Closed fracture of acetabulum (HCC)   Pelvic fracture (HCC)  Acute DVT - patient's wife and patient's son are requesting treatment - would like to start  coumadin - will bridge with Lovenox - discussed briefly with vascular surgery who stated given patient's DVT only intervention would be removal of first rib but given comorbidites anticoagulation for 3-6 months would suffice - discussed significant risk with patient being fall risk - family understands risk of bleeding given risk of fall  Left hip and pelvis pain -up with therapy - nonsurgical - patient's son to bring patient home with him when he is improved  A. fib Mali score4-5, not on AC/Mobitz type II status post pacemaker 2010 with revision 2013-does not require telemetry. Monitor--cont ASA 81 daily  Chronic kidney disease stage 3 -keep volumes even - continue metolazone 2.5  Compensated nonischemic cardiomyopathy/chronic diastolic heart failure -holding diuretics as above continue amlodipine 2.5 - Historically has poor tolerance to beta blocker for medications  Prior bladder interstimulator -followed by urologist Dr. Jesusita Oka  Mod-severe dementia with behavioural dist -holding Buspar  Chr Pancytopenia -followed previously at [facility] -has not been seen there since 2012  Prior GI bleed 08/2014, prior dysphagia-currently stable - monitor H/H given starting coumadin  Severe mitral regurg-currently stable - continue to monitor vitals- may need resumption of low dose diuretic    DVT prophylaxis: lovenox and coumadin Code Status: Full Code Family Communication: Talked on phone with patient's wife and to patient's son who is bedside Disposition Plan: will discharge when INR therapeutic   Consultants:   Orthopedics  CM  PT  Procedures:   none  Antimicrobials:   none    Subjective: Patient awake at time of exam but could no talk in clear sentences.  Son expresses concern over left arm swelling.  After results of U/S anticoagulation choices discussed with patient's son and patient's wife.  Objective: Vitals:   11/09/16 1054 11/09/16 1420  11/09/16  2117 11/10/16 0600  BP: 120/74 111/75 115/64 (!) 106/57  Pulse:  61 62 60  Resp:  18 20 18   Temp:   98.6 F (37 C)   TempSrc:   Oral   SpO2:  92% 100% 97%  Weight:      Height:        Intake/Output Summary (Last 24 hours) at 11/10/16 1652 Last data filed at 11/10/16 1500  Gross per 24 hour  Intake           1197.5 ml  Output              200 ml  Net            997.5 ml   Filed Weights   11/08/16 1108 11/08/16 1905  Weight: 79.4 kg (175 lb) 77.1 kg (169 lb 15.6 oz)    Examination:  General exam: Appears calm and comfortable  Respiratory system: Clear to auscultation. Respiratory effort normal. Cardiovascular system: S1 & S2 heard, RRR. No JVD, murmurs, rubs, gallops or clicks. No pedal edema. Gastrointestinal system: Abdomen is nondistended, soft and nontender. No organomegaly or masses felt. Normal bowel sounds heard. Central nervous system: Alert but confused Extremities: left arm swollen and tender. Skin: scattered ecchymoses on upper extremities Psychiatry: normal affect    Data Reviewed: I have personally reviewed following labs and imaging studies  CBC:  Recent Labs Lab 11/08/16 1232 11/09/16 0533  WBC 5.5 6.6  NEUTROABS 4.9  --   HGB 10.4* 10.3*  HCT 31.7* 31.2*  MCV 97.5 97.2  PLT 101* A999333*   Basic Metabolic Panel:  Recent Labs Lab 11/08/16 1232 11/09/16 0533  NA 143 142  K 4.7 4.6  CL 109 110  CO2 25 22  GLUCOSE 89 91  BUN 42* 48*  CREATININE 1.59* 1.60*  CALCIUM 8.7* 8.4*   GFR: Estimated Creatinine Clearance: 33 mL/min (by C-G formula based on SCr of 1.6 mg/dL (H)). Liver Function Tests:  Recent Labs Lab 11/08/16 1232 11/09/16 0533  AST 61* 62*  ALT 31 30  ALKPHOS 74 67  BILITOT 3.2* 2.4*  PROT 6.9 5.8*  ALBUMIN 3.3* 2.8*   No results for input(s): LIPASE, AMYLASE in the last 168 hours. No results for input(s): AMMONIA in the last 168 hours. Coagulation Profile: No results for input(s): INR, PROTIME in the last 168  hours. Cardiac Enzymes:  Recent Labs Lab 11/08/16 1232  CKTOTAL 794*  TROPONINI 0.42*   BNP (last 3 results) No results for input(s): PROBNP in the last 8760 hours. HbA1C: No results for input(s): HGBA1C in the last 72 hours. CBG: No results for input(s): GLUCAP in the last 168 hours. Lipid Profile: No results for input(s): CHOL, HDL, LDLCALC, TRIG, CHOLHDL, LDLDIRECT in the last 72 hours. Thyroid Function Tests: No results for input(s): TSH, T4TOTAL, FREET4, T3FREE, THYROIDAB in the last 72 hours. Anemia Panel: No results for input(s): VITAMINB12, FOLATE, FERRITIN, TIBC, IRON, RETICCTPCT in the last 72 hours. Sepsis Labs: No results for input(s): PROCALCITON, LATICACIDVEN in the last 168 hours.  Recent Results (from the past 240 hour(s))  MRSA PCR Screening     Status: Abnormal   Collection Time: 11/10/16  8:51 AM  Result Value Ref Range Status   MRSA by PCR POSITIVE (A) NEGATIVE Final    Comment: RESULT CALLED TO, READ BACK BY AND VERIFIED WITH: PHILLIPS C. AT 1302 ON [id number] BY THOMPSON S.        The GeneXpert MRSA Assay (FDA approved for  NASAL specimens only), is one component of a comprehensive MRSA colonization surveillance program. It is not intended to diagnose MRSA infection nor to guide or monitor treatment for MRSA infections.          Radiology Studies: Dg Wrist 2 Views Left  Result Date: 11/10/2016 CLINICAL DATA:  Swelling EXAM: LEFT WRIST - 2 VIEW COMPARISON:  08/01/2014 FINDINGS: No acute bony abnormality. Specifically, no fracture, subluxation, or dislocation. Soft tissues are intact. Widening of the scapholunate space is again noted, stable IMPRESSION: No acute bony abnormality. Widening scapholunate distance could reflect ligamentous injury. This finding is stable. Electronically Signed   By: Rolm Baptise M.D.   On: 11/10/2016 12:47   US Venous Img Upper Uni Left  Result Date: 11/10/2016 CLINICAL DATA:  Left upper extremity edema. EXAM: LEFT UPPER  EXTREMITY VENOUS DOPPLER ULTRASOUND TECHNIQUE: Gray-scale sonography with graded compression, as well as color Doppler and duplex ultrasound were performed to evaluate the upper extremity deep venous system from the level of the subclavian vein and including the jugular, axillary, basilic, radial, ulnar and upper cephalic vein. Spectral Doppler was utilized to evaluate flow at rest and with distal augmentation maneuvers. COMPARISON:  None. FINDINGS: Contralateral Subclavian Vein: Respiratory phasicity is normal and symmetric with the symptomatic side. No evidence of thrombus. Normal compressibility. Internal Jugular Vein: Nonocclusive thrombus identified. Subclavian Vein: Visualized subclavian vein demonstrates occlusive thrombus. Axillary Vein: Nonocclusive thrombus identified. Cephalic Vein: No evidence of thrombus. Normal compressibility, respiratory phasicity and response to augmentation. Basilic Vein: No evidence of thrombus. Normal compressibility, respiratory phasicity and response to augmentation. Brachial Veins: No evidence of thrombus. Normal compressibility, respiratory phasicity and response to augmentation. Radial Veins: No evidence of thrombus. Normal compressibility, respiratory phasicity and response to augmentation. Ulnar Veins: No evidence of thrombus. Normal compressibility, respiratory phasicity and response to augmentation. Venous Reflux:  None visualized. Other Findings:  None visualized. IMPRESSION: Positive for deep venous thrombosis in the left subclavian and axillary veins. The visualized subclavian vein demonstrates occlusive thrombus. There is nonocclusive thrombus in the axillary vein. Nonocclusive thrombus also extends up into the left internal jugular vein. Electronically Signed   By: Aletta Edouard M.D.   On: 11/10/2016 16:14        Scheduled Meds: . amLODipine  2.5 mg Oral Daily  . aspirin EC  81 mg Oral Daily  . dorzolamide  1 drop Both Eyes Daily  . enoxaparin (LOVENOX)  injection  40 mg Subcutaneous Q24H  . latanoprost  1 drop Both Eyes QHS   Continuous Infusions: . sodium chloride 75 mL/hr at 11/10/16 1138     LOS: 1 day    Time spent: 30 minutes    Loretha Stapler, MD [facility] Pager [phone]  If 7PM-7AM, please contact night-coverage [email or web address] Password [facility] 11/10/2016, 4:52 PM

## 2016-11-10 NOTE — Care Management (Signed)
Medical Necessity completed for EMS transport. RN to call for transport when patient is ready. CM spoke with son again today and answered all questions. Plan remains patient will go home with son with [facility] services.

## 2016-11-10 NOTE — Progress Notes (Signed)
Patient transferred to ICU. Son expresses that he wants patient to go to hospice after discharge. He also expresses that he wants patient to be DNR and states that there was confusion when his father was admitted. CHG bath completed. MRSA swab sent to lab.

## 2016-11-10 NOTE — Care Management (Signed)
CM spoke with son regarding hospice facility -([facility] place). Patient will not meet requirements for [facility] place (2 weeks or less). Son agreeable still to [facility] services of RN, PT, and SW, should patient decline, [facility] RN and SW can facilitate hospice referral when appropriate. Son would like to talk with MD today regarding left sided swelling. When discharged, son would like EMS transport if possible. CM will add WC order, to be co-signed by MD and will be delivered with hospital bed and BSC.

## 2016-11-10 NOTE — Care Management Important Message (Signed)
Important Message  Patient Details  Name: [name] MRN: [id number] Date of Birth: 06/13/1926   Medicare Important Message Given:  Yes    Lorelei Heikkila, Chauncey [place], RN 11/10/2016, 4:28 PM

## 2016-11-10 NOTE — Clinical Social Work Note (Signed)
CSW received consult for placement. CM has spoken with son this morning and plan is to return home with home health. CSW signing off.   Benay Pike, [place]

## 2016-11-11 DIAGNOSIS — Z7901 Long term (current) use of anticoagulants: Secondary | ICD-10-CM

## 2016-11-11 LAB — CBC
HEMATOCRIT: 30.6 % — AB (ref 39.0–52.0)
Hemoglobin: 9.8 g/dL — ABNORMAL LOW (ref 13.0–17.0)
MCH: 31.8 pg (ref 26.0–34.0)
MCHC: 32 g/dL (ref 30.0–36.0)
MCV: 99.4 fL (ref 78.0–100.0)
Platelets: 82 10*3/uL — ABNORMAL LOW (ref 150–400)
RBC: 3.08 MIL/uL — AB (ref 4.22–5.81)
RDW: 18 % — ABNORMAL HIGH (ref 11.5–15.5)
WBC: 5.7 10*3/uL (ref 4.0–10.5)

## 2016-11-11 LAB — PROTIME-INR
INR: 1.38
Prothrombin Time: 17.1 seconds — ABNORMAL HIGH (ref 11.4–15.2)

## 2016-11-11 MED ORDER — WARFARIN SODIUM 7.5 MG PO TABS
7.5000 mg | ORAL_TABLET | Freq: Once | ORAL | Status: AC
  Administered 2016-11-11: 7.5 mg via ORAL
  Filled 2016-11-11: qty 1

## 2016-11-11 NOTE — Care Management (Signed)
EMS transport arranged. EMS will not be able to transport patient home until 1900. Family and RN updated. Family will be at home to receive patient. Medical Necessity and DNR completed.

## 2016-11-11 NOTE — Care Management (Signed)
Co pay for Lovenox are as follows.   S/W [facility] @ [facility] # [phone]   1.LOVENOX 100 MG BID  COVER- YES  CO-PAY- $ 62.09  PRIOR APPROVAL- NO   2. ENOXAPARIN 100 MG BID  COVER- YES  CO-PAY- $ 37.87  PRIOR APPROVAL- NO   PHARMACY : [facility]

## 2016-11-11 NOTE — Progress Notes (Signed)
[facility] for Lovenox >> Warfarin Indication: VTE treatment  Patient Measurements: Height: 5\' 10"  (177.8 cm) Weight: 177 lb 4 oz (80.4 kg) IBW/kg (Calculated) : 73  Vital Signs: Temp: 97.8 F (36.6 C) (02/08 0700) Temp Source: Oral (02/08 0700) BP: 116/84 (02/08 0700) Pulse Rate: 59 (02/08 0700)  Labs:  Recent Labs  11/08/16 1232 11/09/16 0533 11/11/16 0420  HGB 10.4* 10.3* 9.8*  HCT 31.7* 31.2* 30.6*  PLT 101* 110* 82*  LABPROT  --   --  17.1*  INR  --   --  1.38  CREATININE 1.59* 1.60*  --   CKTOTAL 794*  --   --   TROPONINI 0.42*  --   --    Estimated Creatinine Clearance: 33 mL/min (by C-G formula based on SCr of 1.6 mg/dL (H)).  Medical History: Past Medical History:  Diagnosis Date  . A-fib ([place]   . Alzheimer disease   . Arthritis   . Asymptomatic carotid artery stenosis    BILATERAL MILD ICA---  <50% PER DUPLEX 03-08-2008  . BPH (benign prostatic hypertrophy)   . Cancer ([place])   . Cardiac pacemaker    CARDIOLOGIST-- DR Cristopher Peru (LAST PACE [facility] 05-15-2013)  . CHB (complete heart block) ([place])   . Chronic back pain   . Chronic pancreatitis ([place])    Based on CT findings  . Chronic systolic heart failure ([place])   . Costochondritis, acute   . Dementia   . Erectile dysfunction   . Glaucoma   . History of melanoma in situ    JAN 2012--  LEFT HEEL W/ SLN BX  . History of syncope   . HOH (hard of hearing)   . Hypertension   . IC (interstitial cystitis)   . Nonischemic cardiomyopathy ([place])   . Pancytopenia   . Poor historian   . Rash and other nonspecific skin eruption   . Severe mitral regurgitation   . Urge urinary incontinence   . Visual changes    Medications:  Prescriptions Prior to Admission  Medication Sig Dispense Refill Last Dose  . acetaminophen (TYLENOL) 650 MG CR tablet Take 650 mg by mouth every 8 (eight) hours as needed for pain.    Taking  . amLODipine (NORVASC) 2.5 MG tablet TAKE 1 TABLET  (2.5 MG TOTAL) BY MOUTH DAILY. 30 tablet 1   . aspirin EC 81 MG EC tablet Take 1 tablet (81 mg total) by mouth daily.   Taking  . busPIRone (BUSPAR) 5 MG tablet TAKE 1 TABLET BY MOUTH EVERY DAY AS NEEDED FOR ANXIETY 90 tablet 1 Taking  . diclofenac sodium (VOLTAREN) 1 % GEL Apply 2 g topically daily as needed (pain). Reported on 01/05/2016   Taking  . dorzolamide (TRUSOPT) 2 % ophthalmic solution Place 1 drop into both eyes daily. One drop both eyes    Taking  . ENSURE (ENSURE) Take 237 mLs by mouth daily. Pt takes daily    Taking  . furosemide (LASIX) 20 MG tablet Take 20 mg by mouth daily.     Marland Kitchen KLOR-CON M20 20 MEQ tablet TAKE 1 TABLET (20 MEQ TOTAL) BY MOUTH DAILY. 30 tablet 0   . latanoprost (XALATAN) 0.005 % ophthalmic solution Place 1 drop into both eyes at bedtime. Both eyes    Taking  . Meth-Hyo-M Bl-Na Phos-Ph Sal (URIBEL) 118 MG CAPS Take 1 capsule (118 mg total) by mouth 2 (two) times daily as needed (bladder). 30 capsule 0 Taking  . Carboxymethylcellul-Glycerin  0.5-0.9 % SOLN Apply 1 drop to eye daily. One drop both eyes    Taking  . furosemide (LASIX) 20 MG tablet Take 1 tablet (20 mg total) by mouth daily. May take an additional 20 mg in the afternoon AS NEEDED 90 tablet 0 Taking  . ibuprofen (ADVIL,MOTRIN) 200 MG tablet Take 400 mg by mouth every 6 (six) hours as needed for moderate pain.   Taking  . metolazone (ZAROXOLYN) 2.5 MG tablet Take 1 tablet today before your extra lasix pill this afternoon (Patient not taking: Reported on 09/09/2016) 1 tablet 0 Not Taking  . Multiple Vitamins-Minerals (CENTRUM SILVER 50+MEN) TABS Take 1 tablet by mouth daily. Daily    Taking  . polyethylene glycol powder (GLYCOLAX/MIRALAX) powder Take 17 g by mouth daily. (Patient taking differently: Take 17 g by mouth daily as needed for mild constipation. ) 3350 g 1 Taking   Assessment: 83yo male.  Asked to initiate Lovenox and Warfarin for VTE treatment.  Pt recently discharged from hospital.  MD aware of  anemia / thrombocytopenia / pancytopenia.  Continue Lovenox and Warfarin and monitor closely.  No bleeding reported.    Goal of Therapy:  INR 2-3 Monitor platelets by anticoagulation protocol: Yes   Plan:  Lovenox 1mg /Kq SQ q12hrs until INR at goal Coumadin 7.5mg  today x 1 INR, CBC in am  Hart Robinsons A 11/11/2016,11:36 AM

## 2016-11-11 NOTE — Progress Notes (Signed)
PROGRESS NOTE    [name]  [id number] DOB: 1926-07-25 DOA: 11/08/2016 PCP: [name], MD    Brief Narrative:  83 year old male history of chronic systolic heart EF [id number] 2, NICM, HTN, atrial fibrillation [place] score >5 not on [facility] for this secondary to hematoma of flank 02/2011. Severe mitral regurg, Biventricular PPM placed for Mobitz with 2:1-medtronic placed march 2012, Dementia--documented confusion about meds, chronic pancytopenia since 2007prev follwed at [facility], history of recurrent falls, melanoma L heel s/p excision 2011, Prior bladder stimulator and dementia with behavioral disturbance Colonoscopy 2007 granularity/erosions--refused subsequent ones. EGD 03/2014 neg-referred ENT--rpt COLO on admit 08/2014--diffuclt to perform poor prep-couldn't reach cecum  Admitted under observation for pain control 11/08/16 with fall onto left hip and noted acetabular and inferior pubic ramus fracture on workup. Patient was evaluated and was felt to physical therapy that patient was relatively stable His fractures and nonoperative and patient may benefit from discussion with outpatient home health as well as with coordination with primary care physician regarding his pacemaker and regarding his bladder stimulator whether these can be turned off if he does indeed decline. Note that he has  moderate dementia and will be returning to his home in [place] under supervision of his son who is a Curator and retired Statistician.  Patient was almost ready for discharge on 2/7 but had significant left upper extremity swelling- U/S showed DVT and after discussion with patient's wife and son anticoagulation was started.  Risks and benefits explained prior to starting anticoagulation.  Assessment & Plan:   Active Problems:   Closed fracture of acetabulum (HCC)   Pelvic fracture (HCC)  Acute DVT - patient's wife and patient's son are requesting treatment - would like to start  coumadin - will bridge with Lovenox - discussed briefly with vascular surgery who stated given patient's DVT only intervention would be removal of first rib but given comorbidites anticoagulation for 3-6 months would suffice - discussed significant risk with patient being fall risk - family understands risk of bleeding given risk of fall and wishes to proceed with anticoagulation  Left hip and pelvis pain -up with therapy - nonsurgical - patient's son to bring patient home with him when he is improved  A. fib [place] score4-5, not on AC/Mobitz type II status post pacemaker 2010 with revision 2013 -does not require telemetry - patient to be on lovenox and coumadin now  Chronic kidney disease stage 3 -keep volumes even - continue metolazone 2.5mg   Compensated nonischemic cardiomyopathy/chronic diastolic heart failure - continue amlodipine 2.5 - Historically has poor tolerance to beta blocker for medications  Prior bladder interstimulator -followed by urologist Dr. Jesusita Oka  Mod-severe dementia with behavioural dist -holding Buspar  Chr Pancytopenia -followed previously at [facility] -has not been seen there since 2012 - continue to follow  Prior GI bleed 08/2014, prior dysphagia-currently stable - monitor H/H given starting coumadin - serial CBC  Severe mitral regurg-currently stable - continue to monitor vitals - may need resumption of low dose diuretic    DVT prophylaxis: lovenox and coumadin Code Status: Full Code Family Communication: Talked to patient's son who is bedside Disposition Plan: will discharge when INR therapeutic or sooner if case management can set up home health to monitor INR   Consultants:   Orthopedics  CM  PT  Procedures:   none  Antimicrobials:   none    Subjective: Patient awake at time of exam and conversive.  Patient asking questions mostly about whether he could rehab at home.  Son is bedside and is about to feed patient  breakfast.  Discussed with patient and son the plan for blood thinners.  Objective: Vitals:   11/11/16 0400 11/11/16 0500 11/11/16 0600 11/11/16 0700  BP: 104/64 104/66 (!) 113/94 116/84  Pulse: 62 60 60 (!) 59  Resp: 19 16 15 14   Temp: 98.3 F (36.8 C)   97.8 F (36.6 C)  TempSrc: Oral   Oral  SpO2: (!) 87% 97% 96% 98%  Weight:  80.4 kg (177 lb 4 oz)    Height:        Intake/Output Summary (Last 24 hours) at 11/11/16 0901 Last data filed at 11/11/16 0600  Gross per 24 hour  Intake           1392.5 ml  Output             1000 ml  Net            392.5 ml   Filed Weights   11/08/16 1108 11/08/16 1905 11/11/16 0500  Weight: 79.4 kg (175 lb) 77.1 kg (169 lb 15.6 oz) 80.4 kg (177 lb 4 oz)    Examination:  General exam: Appears calm and comfortable  Respiratory system: Clear to auscultation. Respiratory effort normal. Cardiovascular system: S1 & S2 heard, RRR. III/VI holosystolic murmur heard best at apex, No JVD, rubs, gallops or clicks. No pedal edema. Gastrointestinal system: Abdomen is nondistended, soft and nontender. No organomegaly or masses felt. Normal bowel sounds heard. Central nervous system: Alert but confused Extremities: left arm swollen and tender. Skin: scattered ecchymoses on upper extremities Psychiatry: normal affect    Data Reviewed: I have personally reviewed following labs and imaging studies  CBC:  Recent Labs Lab 11/08/16 1232 11/09/16 0533 11/11/16 0420  WBC 5.5 6.6 5.7  NEUTROABS 4.9  --   --   HGB 10.4* 10.3* 9.8*  HCT 31.7* 31.2* 30.6*  MCV 97.5 97.2 99.4  PLT 101* 110* 82*   Basic Metabolic Panel:  Recent Labs Lab 11/08/16 1232 11/09/16 0533  NA 143 142  K 4.7 4.6  CL 109 110  CO2 25 22  GLUCOSE 89 91  BUN 42* 48*  CREATININE 1.59* 1.60*  CALCIUM 8.7* 8.4*   GFR: Estimated Creatinine Clearance: 33 mL/min (by C-G formula based on SCr of 1.6 mg/dL (H)). Liver Function Tests:  Recent Labs Lab 11/08/16 1232  11/09/16 0533  AST 61* 62*  ALT 31 30  ALKPHOS 74 67  BILITOT 3.2* 2.4*  PROT 6.9 5.8*  ALBUMIN 3.3* 2.8*   No results for input(s): LIPASE, AMYLASE in the last 168 hours. No results for input(s): AMMONIA in the last 168 hours. Coagulation Profile:  Recent Labs Lab 11/11/16 0420  INR 1.38   Cardiac Enzymes:  Recent Labs Lab 11/08/16 1232  CKTOTAL 794*  TROPONINI 0.42*   BNP (last 3 results) No results for input(s): PROBNP in the last 8760 hours. HbA1C: No results for input(s): HGBA1C in the last 72 hours. CBG: No results for input(s): GLUCAP in the last 168 hours. Lipid Profile: No results for input(s): CHOL, HDL, LDLCALC, TRIG, CHOLHDL, LDLDIRECT in the last 72 hours. Thyroid Function Tests: No results for input(s): TSH, T4TOTAL, FREET4, T3FREE, THYROIDAB in the last 72 hours. Anemia Panel: No results for input(s): VITAMINB12, FOLATE, FERRITIN, TIBC, IRON, RETICCTPCT in the last 72 hours. Sepsis Labs: No results for input(s): PROCALCITON, LATICACIDVEN in the last 168 hours.  Recent Results (from the past 240 hour(s))  MRSA PCR Screening  Status: Abnormal   Collection Time: 11/10/16  8:51 AM  Result Value Ref Range Status   MRSA by PCR POSITIVE (A) NEGATIVE Final    Comment: RESULT CALLED TO, READ BACK BY AND VERIFIED WITH: PHILLIPS C. AT 1302 ON HN:[id number] BY THOMPSON S.        The GeneXpert MRSA Assay (FDA approved for NASAL specimens only), is one component of a comprehensive MRSA colonization surveillance program. It is not intended to diagnose MRSA infection nor to guide or monitor treatment for MRSA infections.          Radiology Studies: Dg Wrist 2 Views Left  Result Date: 11/10/2016 CLINICAL DATA:  Swelling EXAM: LEFT WRIST - 2 VIEW COMPARISON:  08/01/2014 FINDINGS: No acute bony abnormality. Specifically, no fracture, subluxation, or dislocation. Soft tissues are intact. Widening of the scapholunate space is again noted, stable IMPRESSION: No  acute bony abnormality. Widening scapholunate distance could reflect ligamentous injury. This finding is stable. Electronically Signed   By: Rolm Baptise M.D.   On: 11/10/2016 12:47   US Venous Img Upper Uni Left  Result Date: 11/10/2016 CLINICAL DATA:  Left upper extremity edema. EXAM: LEFT UPPER EXTREMITY VENOUS DOPPLER ULTRASOUND TECHNIQUE: Gray-scale sonography with graded compression, as well as color Doppler and duplex ultrasound were performed to evaluate the upper extremity deep venous system from the level of the subclavian vein and including the jugular, axillary, basilic, radial, ulnar and upper cephalic vein. Spectral Doppler was utilized to evaluate flow at rest and with distal augmentation maneuvers. COMPARISON:  None. FINDINGS: Contralateral Subclavian Vein: Respiratory phasicity is normal and symmetric with the symptomatic side. No evidence of thrombus. Normal compressibility. Internal Jugular Vein: Nonocclusive thrombus identified. Subclavian Vein: Visualized subclavian vein demonstrates occlusive thrombus. Axillary Vein: Nonocclusive thrombus identified. Cephalic Vein: No evidence of thrombus. Normal compressibility, respiratory phasicity and response to augmentation. Basilic Vein: No evidence of thrombus. Normal compressibility, respiratory phasicity and response to augmentation. Brachial Veins: No evidence of thrombus. Normal compressibility, respiratory phasicity and response to augmentation. Radial Veins: No evidence of thrombus. Normal compressibility, respiratory phasicity and response to augmentation. Ulnar Veins: No evidence of thrombus. Normal compressibility, respiratory phasicity and response to augmentation. Venous Reflux:  None visualized. Other Findings:  None visualized. IMPRESSION: Positive for deep venous thrombosis in the left subclavian and axillary veins. The visualized subclavian vein demonstrates occlusive thrombus. There is nonocclusive thrombus in the axillary vein.  Nonocclusive thrombus also extends up into the left internal jugular vein. Electronically Signed   By: Aletta Edouard M.D.   On: 11/10/2016 16:14        Scheduled Meds: . amLODipine  2.5 mg Oral Daily  . aspirin EC  81 mg Oral Daily  . dorzolamide  1 drop Both Eyes Daily  . enoxaparin (LOVENOX) injection  80 mg Subcutaneous Q12H  . latanoprost  1 drop Both Eyes QHS  . Warfarin - Pharmacist Dosing Inpatient   Does not apply Q24H   Continuous Infusions: . sodium chloride 75 mL/hr at 11/11/16 0600     LOS: 2 days    Time spent: 30 minutes    Loretha Stapler, MD [facility] Pager [phone]  If 7PM-7AM, please contact night-coverage [email or web address] Password [email or web address] 11/11/2016, 9:01 AM

## 2016-11-11 NOTE — Progress Notes (Signed)
Spoke with case manager who reported that EMS transport for patient has been called and will be at [facility] to pick up the patient for transport home around 1900 this evening.

## 2016-11-12 DIAGNOSIS — Z7901 Long term (current) use of anticoagulants: Secondary | ICD-10-CM | POA: Diagnosis not present

## 2016-11-12 DIAGNOSIS — Z5181 Encounter for therapeutic drug level monitoring: Secondary | ICD-10-CM | POA: Diagnosis not present

## 2016-11-12 DIAGNOSIS — Z87891 Personal history of nicotine dependence: Secondary | ICD-10-CM | POA: Diagnosis not present

## 2016-11-12 DIAGNOSIS — I5022 Chronic systolic (congestive) heart failure: Secondary | ICD-10-CM | POA: Diagnosis not present

## 2016-11-12 DIAGNOSIS — I4891 Unspecified atrial fibrillation: Secondary | ICD-10-CM | POA: Diagnosis not present

## 2016-11-12 DIAGNOSIS — F0280 Dementia in other diseases classified elsewhere without behavioral disturbance: Secondary | ICD-10-CM | POA: Diagnosis not present

## 2016-11-12 DIAGNOSIS — I442 Atrioventricular block, complete: Secondary | ICD-10-CM | POA: Diagnosis not present

## 2016-11-12 DIAGNOSIS — I429 Cardiomyopathy, unspecified: Secondary | ICD-10-CM | POA: Diagnosis not present

## 2016-11-12 DIAGNOSIS — S32502D Unspecified fracture of left pubis, subsequent encounter for fracture with routine healing: Secondary | ICD-10-CM | POA: Diagnosis not present

## 2016-11-12 DIAGNOSIS — Z9181 History of falling: Secondary | ICD-10-CM | POA: Diagnosis not present

## 2016-11-12 DIAGNOSIS — S32402D Unspecified fracture of left acetabulum, subsequent encounter for fracture with routine healing: Secondary | ICD-10-CM | POA: Diagnosis not present

## 2016-11-12 DIAGNOSIS — I130 Hypertensive heart and chronic kidney disease with heart failure and stage 1 through stage 4 chronic kidney disease, or unspecified chronic kidney disease: Secondary | ICD-10-CM | POA: Diagnosis not present

## 2016-11-12 DIAGNOSIS — R296 Repeated falls: Secondary | ICD-10-CM | POA: Diagnosis not present

## 2016-11-12 DIAGNOSIS — I82602 Acute embolism and thrombosis of unspecified veins of left upper extremity: Secondary | ICD-10-CM | POA: Diagnosis not present

## 2016-11-12 DIAGNOSIS — N183 Chronic kidney disease, stage 3 (moderate): Secondary | ICD-10-CM | POA: Diagnosis not present

## 2016-11-12 DIAGNOSIS — G309 Alzheimer's disease, unspecified: Secondary | ICD-10-CM | POA: Diagnosis not present

## 2016-11-12 DIAGNOSIS — M1990 Unspecified osteoarthritis, unspecified site: Secondary | ICD-10-CM | POA: Diagnosis not present

## 2016-11-12 NOTE — Discharge Summary (Signed)
Physician Discharge Summary  [name] [id number] DOB: [birth year] DOA: 11/08/2016  PCP: [name], MD  Admit date: 11/08/2016 Discharge date: 11/28/2016  Admitted From: Home Disposition:  Home   Recommendations for Outpatient Follow-up:  1. Follow up with PCP in 1-2 weeks 2. Please obtain BMP/CBC in one week 3. Get INR checked in two day 4. Take lovenox as prescribed as well as coumadin daily 5. Home PT/OT/SW/RN/Aide set up  Home Health: Yes- PT/OT/SW/RN/Aide Equipment/Devices: Hospital Bed  Discharge Condition: Stable (Stable, guarded, hospice) CODE STATUS: DNR- paperwork completed prior to transport home Diet recommendation: Heart Healthy    Brief/Interim Summary: 83 year old male history of chronic systolic heart EF [id number] 2, NICM, HTN, atrial fibrillation [place] score >5 not on [facility] for this secondary to hematoma of flank 02/2011. Severe mitral regurg, Biventricular PPM placed for Mobitz with 2:1-medtronic placed march 2012, Dementia--documented confusion about meds, chronic pancytopenia since 2007prev follwed at [facility], history of recurrent falls, melanoma L heel s/p excision 2011, Prior bladder stimulator and dementia with behavioral disturbance. Colonoscopy 2007 granularity/erosions--refused subsequent ones. EGD 03/2014 neg-referred ENT--rpt COLO on admit 08/2014--diffuclt to perform poor prep-couldn't reach cecum  Admitted under observation for pain control 11/08/16 with fall onto left hip and noted acetabular and inferior pubic ramus fracture on workup. [name] was evaluated and was felt to physical therapy that [name] was relatively stable. His fractures and nonoperative and [name] may benefit from discussion with outpatient home health as well as with coordination with primary care physician regarding his pacemaker and regarding his bladder stimulator whether these can be turned off if he does indeed decline. Note that he has moderate dementia and will be returning to his  home in [place] under supervision of his son who is a Curator and retired Statistician.  [name] was almost ready for discharge on 2/7 but had significant left upper extremity swelling- U/S showed DVT and after discussion with [name]'s wife and son anticoagulation was started.  Risks and benefits explained prior to starting anticoagulation.  He was started on coumadin and lovenox and [facility] was set up to monitor patients INR at time of discharge.   Discharge Diagnoses:  Active Problems:   Closed fracture of acetabulum (HCC)   Pelvic fracture [facility])    Discharge Instructions  Discharge Instructions    Diet - low sodium heart healthy    Complete by:  As directed    For home use only DME Hospital bed    Complete by:  As directed    The above medical condition requires:  [name] requires the ability to reposition frequently   Head must be elevated greater than:  45 degrees   Bed type:  Semi-electric   Trapeze Bar:  Yes   Support Surface:  Gel Overlay   Increase activity slowly    Complete by:  As directed      Allergies as of 11/11/2016      Reactions   Codeine Diarrhea   Penicillins    Other reaction(s): OTHER  nHas [name] had a PCN reaction causing immediate rash, facial/tongue/throat swelling, SOB or lightheadedness with hypotension: Nono Has [name] had a PCN reaction causing severe rash involving mucus membranes or skin necrosis: Nono Has [name] had a PCN reaction that required hospitalization Nono Has [name] had a PCN reaction occurring within the last 10 years: Nono If all of the above answers are "NO", then may      Medication List    STOP taking these medications   CENTRUM SILVER 50+MEN  Tabs   KLOR-CON M20 20 MEQ tablet Generic drug:  potassium chloride SA   metolazone 2.5 MG tablet Commonly known as:  ZAROXOLYN     TAKE these medications   acetaminophen 650 MG CR tablet Commonly known as:  TYLENOL Take 650 mg by mouth every 8 (eight)  hours as needed for pain.   amLODipine 2.5 MG tablet Commonly known as:  NORVASC TAKE 1 TABLET (2.5 MG TOTAL) BY MOUTH DAILY.   aspirin 81 MG EC tablet Take 1 tablet (81 mg total) by mouth daily.   busPIRone 5 MG tablet Commonly known as:  BUSPAR TAKE 1 TABLET BY MOUTH EVERY DAY AS NEEDED FOR ANXIETY   Carboxymethylcellul-Glycerin 0.5-0.9 % Soln Apply 1 drop to eye daily. One drop both eyes   diclofenac sodium 1 % Gel Commonly known as:  VOLTAREN Apply 2 g topically daily as needed (pain). Reported on 01/05/2016   dorzolamide 2 % ophthalmic solution Commonly known as:  TRUSOPT Place 1 drop into both eyes daily. One drop both eyes   enoxaparin 40 MG/0.4ML injection Commonly known as:  LOVENOX Inject 0.8 mLs (80 mg total) into the skin daily.   ENSURE Take 237 mLs by mouth daily. Pt takes daily   furosemide 20 MG tablet Commonly known as:  LASIX Take 20 mg by mouth daily. What changed:  Another medication with the same name was removed. Continue taking this medication, and follow the directions you see here.   ibuprofen 200 MG tablet Commonly known as:  ADVIL,MOTRIN Take 400 mg by mouth every 6 (six) hours as needed for moderate pain.   polyethylene glycol powder powder Commonly known as:  GLYCOLAX/MIRALAX Take 17 g by mouth daily. What changed:  when to take this  reasons to take this   warfarin 5 MG tablet Commonly known as:  COUMADIN Take 1 tablet (5 mg total) by mouth daily.     ASK your doctor about these medications   latanoprost 0.005 % ophthalmic solution Commonly known as:  XALATAN Place 1 drop into both eyes at bedtime. Both eyes   URIBEL 118 MG Caps Take 1 capsule (118 mg total) by mouth 2 (two) times daily as needed (bladder).            Durable Medical Equipment        Start     Ordered   11/09/16 0000  For home use only DME Hospital bed    Question Answer Comment  The above medical condition requires: [name] requires the ability to  reposition frequently   Head must be elevated greater than: 45 degrees   Bed type Semi-electric   Trapeze Bar Yes   Support Surface: Gel Overlay      11/09/16 1654      Allergies  Allergen Reactions  . Codeine Diarrhea  . Penicillins     Other reaction(s): OTHER  nHas [name] had a PCN reaction causing immediate rash, facial/tongue/throat swelling, SOB or lightheadedness with hypotension: Nono Has [name] had a PCN reaction causing severe rash involving mucus membranes or skin necrosis: Nono Has [name] had a PCN reaction that required hospitalization Nono Has [name] had a PCN reaction occurring within the last 10 years: Nono If all of the above answers are "NO", then may    Consultations:  PT  CM/SW   Procedures/Studies: Dg Chest 2 View  Result Date: 11/08/2016 CLINICAL DATA:  Less post fall last night in reports left hip pain. [name] has history of atrial fibrillation, myelodysplasia, former smoker.  EXAM: CHEST  2 VIEW COMPARISON:  PA and lateral chest x-ray dated September 16, 2016. FINDINGS: The lungs are well-expanded. There is no focal infiltrate. There is no pleural effusion. The cardiac silhouette remains enlarged. The central pulmonary vascularity is prominent. There is minimal interstitial prominence that is stable and may reflect chronic bronchitic change or low-grade compensated CHF. The ICD is in stable position. There is calcification in the wall of the aortic arch. There is no pleural effusion. IMPRESSION: Probable low-grade compensated CHF. No acute cardiopulmonary abnormality. Thoracic aortic atherosclerosis. Electronically Signed   By: David  Martinique M.D.   On: 11/08/2016 12:27   Dg Wrist 2 Views Left  Result Date: 11/10/2016 CLINICAL DATA:  Swelling EXAM: LEFT WRIST - 2 VIEW COMPARISON:  08/01/2014 FINDINGS: No acute bony abnormality. Specifically, no fracture, subluxation, or dislocation. Soft tissues are intact. Widening of the scapholunate space is again noted,  stable IMPRESSION: No acute bony abnormality. Widening scapholunate distance could reflect ligamentous injury. This finding is stable. Electronically Signed   By: Rolm Baptise M.D.   On: 11/10/2016 12:47   Ct Head Wo Contrast  Result Date: 11/08/2016 CLINICAL DATA:  Fall. EXAM: CT HEAD WITHOUT CONTRAST TECHNIQUE: Contiguous axial images were obtained from the base of the skull through the vertex without intravenous contrast. COMPARISON:  CT scan of Feb 19, 2016. FINDINGS: Brain: Mild diffuse cortical atrophy is noted. Minimal chronic ischemic white matter disease is noted. No mass effect or midline shift is noted. Ventricular size is within normal limits. There is no evidence of mass lesion, hemorrhage or acute infarction. Vascular: No hyperdense vessel or unexpected calcification. Skull: Normal. Negative for fracture or focal lesion. Sinuses/Orbits: No acute finding. Other: None. IMPRESSION: Mild diffuse cortical atrophy. Minimal chronic ischemic white matter disease. No acute intracranial abnormality seen. Electronically Signed   By: Marijo Conception, M.D.   On: 11/08/2016 12:30   Ct Hip Left Wo Contrast  Result Date: 11/08/2016 CLINICAL DATA:  Left hip pain secondary to a fall last night. Abnormal radiographs. EXAM: CT OF THE LEFT HIP WITHOUT CONTRAST TECHNIQUE: Multidetector CT imaging of the left hip was performed according to the standard protocol. Multiplanar CT image reconstructions were also generated. COMPARISON:  Radiographs dated 11/08/2016 FINDINGS: Bones/Joint/Cartilage There is a slightly displaced fracture of the left inferior pubic ramus. There is a comminuted fracture of the medial wall of the left acetabulum extending superiorly into the left ilium. There is suggestion of a fracture of the left sacral ala on image 1 of series 2 but this is not definitive. Proximal left femur appears intact.  Left hip hemarthrosis. IMPRESSION: 1. Fracture of the medial wall of the left acetabulum extending  superiorly. 2. Fracture of the left inferior pubic ramus. 3. Left hip hemarthrosis. 4. Possible subtle impaction fracture of the left sacral ala. Electronically Signed   By: Lorriane Shire M.D.   On: 11/08/2016 14:28   US Venous Img Upper Uni Left  Result Date: 11/10/2016 CLINICAL DATA:  Left upper extremity edema. EXAM: LEFT UPPER EXTREMITY VENOUS DOPPLER ULTRASOUND TECHNIQUE: Gray-scale sonography with graded compression, as well as color Doppler and duplex ultrasound were performed to evaluate the upper extremity deep venous system from the level of the subclavian vein and including the jugular, axillary, basilic, radial, ulnar and upper cephalic vein. Spectral Doppler was utilized to evaluate flow at rest and with distal augmentation maneuvers. COMPARISON:  None. FINDINGS: Contralateral Subclavian Vein: Respiratory phasicity is normal and symmetric with the symptomatic side. No evidence  of thrombus. Normal compressibility. Internal Jugular Vein: Nonocclusive thrombus identified. Subclavian Vein: Visualized subclavian vein demonstrates occlusive thrombus. Axillary Vein: Nonocclusive thrombus identified. Cephalic Vein: No evidence of thrombus. Normal compressibility, respiratory phasicity and response to augmentation. Basilic Vein: No evidence of thrombus. Normal compressibility, respiratory phasicity and response to augmentation. Brachial Veins: No evidence of thrombus. Normal compressibility, respiratory phasicity and response to augmentation. Radial Veins: No evidence of thrombus. Normal compressibility, respiratory phasicity and response to augmentation. Ulnar Veins: No evidence of thrombus. Normal compressibility, respiratory phasicity and response to augmentation. Venous Reflux:  None visualized. Other Findings:  None visualized. IMPRESSION: Positive for deep venous thrombosis in the left subclavian and axillary veins. The visualized subclavian vein demonstrates occlusive thrombus. There is nonocclusive  thrombus in the axillary vein. Nonocclusive thrombus also extends up into the left internal jugular vein. Electronically Signed   By: Aletta Edouard M.D.   On: 11/10/2016 16:14   Dg Hip Unilat W Or Wo Pelvis 2-3 Views Left  Result Date: 11/08/2016 CLINICAL DATA:  Status post fall last night with pain in the left hip. History of Milo dysplasia, chronic CHF. EXAM: DG HIP (WITH OR WITHOUT PELVIS) 2-3V LEFT COMPARISON:  Left hip series dated August 01, 2014 FINDINGS: The bones are subjectively osteopenic. There is deformity of the inferior pubic ramus on the left which has appeared since October 2015. This may be acute or chronic. No definite superior pubic ramus fracture is observed but a fracture involving the lateral aspect of the superior pubic ramus with its junction with the ischium may be present. There is moderate symmetric narrowing of the left hip joint space. The femoral head and acetabulum appear intact. The femoral neck, intertrochanteric, and subtrochanteric regions are normal. IMPRESSION: Deformity of the inferior pubic ramus worrisome former for fracture which is likely acute. A definite superior pubic ramus fracture on the left is not clearly identified but may be present laterally. No acute left hip fracture. Mild symmetric narrowing of the left hip joint space. Electronically Signed   By: David  Martinique M.D.   On: 11/08/2016 12:25       Subjective: [name] seen and examined.  Stable for discharge. Son able to take [name] home.  Discussed code status with [name]'s wife- she agrees he previously signed paperwork to be a DNR.  Discharge Exam: Vitals:   11/11/16 1500 11/11/16 1600  BP: 99/70   Pulse: 60   Resp: 19   Temp:  98.1 F (36.7 C)   Vitals:   11/11/16 1300 11/11/16 1400 11/11/16 1500 11/11/16 1600  BP: 99/64 (!) 86/64 99/70   Pulse: 62 60 60   Resp: 19 17 19    Temp:    98.1 F (36.7 C)  TempSrc:    Oral  SpO2: 98% 99% 99%   Weight:      Height:        General:  Pt is alert, awake, not in acute distress Cardiovascular: RRR, S1/S2 +, no rubs, no gallops Respiratory: CTA bilaterally, no wheezing, no rhonchi Abdominal: Soft, NT, ND, bowel sounds + Extremities: no edema, no cyanosis    The results of significant diagnostics from this hospitalization (including imaging, microbiology, ancillary and laboratory) are listed below for reference.     Microbiology: No results found for this or any previous visit (from the past 240 hour(s)).   Labs: BNP (last 3 results)  Recent Labs  05/10/16 1156 06/28/16 1327 08/19/16 1549  BNP 1,122.3* 723.0* [id number]*   Basic Metabolic Panel: No results for  input(s): NA, K, CL, CO2, GLUCOSE, BUN, CREATININE, CALCIUM, MG, PHOS in the last 168 hours. Liver Function Tests: No results for input(s): AST, ALT, ALKPHOS, BILITOT, PROT, ALBUMIN in the last 168 hours. No results for input(s): LIPASE, AMYLASE in the last 168 hours. No results for input(s): AMMONIA in the last 168 hours. CBC: No results for input(s): WBC, NEUTROABS, HGB, HCT, MCV, PLT in the last 168 hours. Cardiac Enzymes: No results for input(s): CKTOTAL, CKMB, CKMBINDEX, TROPONINI in the last 168 hours. BNP: Invalid input(s): POCBNP CBG: No results for input(s): GLUCAP in the last 168 hours. D-Dimer No results for input(s): DDIMER in the last 72 hours. Hgb A1c No results for input(s): HGBA1C in the last 72 hours. Lipid Profile No results for input(s): CHOL, HDL, LDLCALC, TRIG, CHOLHDL, LDLDIRECT in the last 72 hours. Thyroid function studies No results for input(s): TSH, T4TOTAL, T3FREE, THYROIDAB in the last 72 hours.  Invalid input(s): FREET3 Anemia work up No results for input(s): VITAMINB12, FOLATE, FERRITIN, TIBC, IRON, RETICCTPCT in the last 72 hours. Urinalysis    Component Value Date/Time   COLORURINE GREEN (A) 10/27/2016 1417   APPEARANCEUR CLEAR 10/27/2016 1417   LABSPEC 1.015 10/27/2016 1417   PHURINE 6.0 10/27/2016 1417    GLUCOSEU NEGATIVE 10/27/2016 1417   HGBUR NEGATIVE 10/27/2016 1417   BILIRUBINUR MODERATE (A) 10/27/2016 1417   BILIRUBINUR neg 12/28/2013 1142   KETONESUR NEGATIVE 10/27/2016 1417   PROTEINUR TRACE (A) 10/27/2016 1417   UROBILINOGEN 1.0 12/28/2013 1142   NITRITE NEGATIVE 10/27/2016 1417   LEUKOCYTESUR SMALL (A) 10/27/2016 1417   Sepsis Labs Invalid input(s): PROCALCITONIN,  WBC,  LACTICIDVEN Microbiology No results found for this or any previous visit (from the past 240 hour(s)).   Time coordinating discharge: Over 35 minutes  SIGNED:   Loretha Stapler, MD  [facility] 11/28/2016, 4:42 PM Pager [phone] If 7PM-7AM, please contact night-coverage [email or web address] Password [email or web address]

## 2016-11-13 DIAGNOSIS — I429 Cardiomyopathy, unspecified: Secondary | ICD-10-CM | POA: Diagnosis not present

## 2016-11-13 DIAGNOSIS — S32402D Unspecified fracture of left acetabulum, subsequent encounter for fracture with routine healing: Secondary | ICD-10-CM | POA: Diagnosis not present

## 2016-11-13 DIAGNOSIS — F0280 Dementia in other diseases classified elsewhere without behavioral disturbance: Secondary | ICD-10-CM | POA: Diagnosis not present

## 2016-11-13 DIAGNOSIS — S32502D Unspecified fracture of left pubis, subsequent encounter for fracture with routine healing: Secondary | ICD-10-CM | POA: Diagnosis not present

## 2016-11-13 DIAGNOSIS — I5022 Chronic systolic (congestive) heart failure: Secondary | ICD-10-CM | POA: Diagnosis not present

## 2016-11-13 DIAGNOSIS — I82602 Acute embolism and thrombosis of unspecified veins of left upper extremity: Secondary | ICD-10-CM | POA: Diagnosis not present

## 2016-11-13 DIAGNOSIS — N183 Chronic kidney disease, stage 3 (moderate): Secondary | ICD-10-CM | POA: Diagnosis not present

## 2016-11-13 DIAGNOSIS — Z5181 Encounter for therapeutic drug level monitoring: Secondary | ICD-10-CM | POA: Diagnosis not present

## 2016-11-13 DIAGNOSIS — R296 Repeated falls: Secondary | ICD-10-CM | POA: Diagnosis not present

## 2016-11-13 DIAGNOSIS — G309 Alzheimer's disease, unspecified: Secondary | ICD-10-CM | POA: Diagnosis not present

## 2016-11-13 DIAGNOSIS — I130 Hypertensive heart and chronic kidney disease with heart failure and stage 1 through stage 4 chronic kidney disease, or unspecified chronic kidney disease: Secondary | ICD-10-CM | POA: Diagnosis not present

## 2016-11-13 DIAGNOSIS — Z7901 Long term (current) use of anticoagulants: Secondary | ICD-10-CM | POA: Diagnosis not present

## 2016-11-13 DIAGNOSIS — I4891 Unspecified atrial fibrillation: Secondary | ICD-10-CM | POA: Diagnosis not present

## 2016-11-13 DIAGNOSIS — I442 Atrioventricular block, complete: Secondary | ICD-10-CM | POA: Diagnosis not present

## 2016-11-15 ENCOUNTER — Ambulatory Visit (INDEPENDENT_AMBULATORY_CARE_PROVIDER_SITE_OTHER): Payer: Medicare Other | Admitting: *Deleted

## 2016-11-15 ENCOUNTER — Telehealth: Payer: Self-pay

## 2016-11-15 DIAGNOSIS — I5022 Chronic systolic (congestive) heart failure: Secondary | ICD-10-CM | POA: Diagnosis not present

## 2016-11-15 DIAGNOSIS — I82602 Acute embolism and thrombosis of unspecified veins of left upper extremity: Secondary | ICD-10-CM | POA: Diagnosis not present

## 2016-11-15 DIAGNOSIS — N183 Chronic kidney disease, stage 3 (moderate): Secondary | ICD-10-CM | POA: Diagnosis not present

## 2016-11-15 DIAGNOSIS — I82629 Acute embolism and thrombosis of deep veins of unspecified upper extremity: Secondary | ICD-10-CM | POA: Insufficient documentation

## 2016-11-15 DIAGNOSIS — S32502D Unspecified fracture of left pubis, subsequent encounter for fracture with routine healing: Secondary | ICD-10-CM | POA: Diagnosis not present

## 2016-11-15 DIAGNOSIS — S32402D Unspecified fracture of left acetabulum, subsequent encounter for fracture with routine healing: Secondary | ICD-10-CM | POA: Diagnosis not present

## 2016-11-15 DIAGNOSIS — Z5181 Encounter for therapeutic drug level monitoring: Secondary | ICD-10-CM

## 2016-11-15 DIAGNOSIS — I130 Hypertensive heart and chronic kidney disease with heart failure and stage 1 through stage 4 chronic kidney disease, or unspecified chronic kidney disease: Secondary | ICD-10-CM | POA: Diagnosis not present

## 2016-11-15 LAB — POCT INR: INR: 1.2

## 2016-11-15 NOTE — Telephone Encounter (Signed)
pls try and get the coumadin clinic to manage the coumadin, let me know if they will start today,  He is at very high risk

## 2016-11-15 NOTE — Telephone Encounter (Signed)
Will send to to Edrick Oh with cardiology.  Heather with [facility] called and notified of this.

## 2016-11-15 NOTE — Telephone Encounter (Signed)
Noted, thanks!

## 2016-11-17 ENCOUNTER — Other Ambulatory Visit: Payer: Self-pay | Admitting: Family Medicine

## 2016-11-17 ENCOUNTER — Telehealth: Payer: Self-pay | Admitting: Internal Medicine

## 2016-11-17 ENCOUNTER — Ambulatory Visit (INDEPENDENT_AMBULATORY_CARE_PROVIDER_SITE_OTHER): Payer: Medicare Other | Admitting: *Deleted

## 2016-11-17 DIAGNOSIS — N183 Chronic kidney disease, stage 3 (moderate): Secondary | ICD-10-CM | POA: Diagnosis not present

## 2016-11-17 DIAGNOSIS — Z5181 Encounter for therapeutic drug level monitoring: Secondary | ICD-10-CM

## 2016-11-17 DIAGNOSIS — S32402D Unspecified fracture of left acetabulum, subsequent encounter for fracture with routine healing: Secondary | ICD-10-CM | POA: Diagnosis not present

## 2016-11-17 DIAGNOSIS — I82602 Acute embolism and thrombosis of unspecified veins of left upper extremity: Secondary | ICD-10-CM | POA: Diagnosis not present

## 2016-11-17 DIAGNOSIS — I5022 Chronic systolic (congestive) heart failure: Secondary | ICD-10-CM | POA: Diagnosis not present

## 2016-11-17 DIAGNOSIS — I130 Hypertensive heart and chronic kidney disease with heart failure and stage 1 through stage 4 chronic kidney disease, or unspecified chronic kidney disease: Secondary | ICD-10-CM | POA: Diagnosis not present

## 2016-11-17 DIAGNOSIS — S32502D Unspecified fracture of left pubis, subsequent encounter for fracture with routine healing: Secondary | ICD-10-CM | POA: Diagnosis not present

## 2016-11-17 LAB — POCT INR: INR: 4

## 2016-11-17 NOTE — Telephone Encounter (Signed)
Patient is calling in regards to Lovenox injections, would like a refill. Please call patient son Dajuan Elvin at [phone].

## 2016-11-19 DIAGNOSIS — I82602 Acute embolism and thrombosis of unspecified veins of left upper extremity: Secondary | ICD-10-CM | POA: Diagnosis not present

## 2016-11-19 DIAGNOSIS — S32502D Unspecified fracture of left pubis, subsequent encounter for fracture with routine healing: Secondary | ICD-10-CM | POA: Diagnosis not present

## 2016-11-19 DIAGNOSIS — I130 Hypertensive heart and chronic kidney disease with heart failure and stage 1 through stage 4 chronic kidney disease, or unspecified chronic kidney disease: Secondary | ICD-10-CM | POA: Diagnosis not present

## 2016-11-19 DIAGNOSIS — I5022 Chronic systolic (congestive) heart failure: Secondary | ICD-10-CM | POA: Diagnosis not present

## 2016-11-19 DIAGNOSIS — N183 Chronic kidney disease, stage 3 (moderate): Secondary | ICD-10-CM | POA: Diagnosis not present

## 2016-11-19 DIAGNOSIS — S32402D Unspecified fracture of left acetabulum, subsequent encounter for fracture with routine healing: Secondary | ICD-10-CM | POA: Diagnosis not present

## 2016-11-21 ENCOUNTER — Emergency Department (HOSPITAL_COMMUNITY)
Admission: EM | Admit: 2016-11-21 | Discharge: 2016-12-02 | Disposition: E | Payer: Medicare Other | Attending: Emergency Medicine | Admitting: Emergency Medicine

## 2016-11-21 DIAGNOSIS — Z950 Presence of cardiac pacemaker: Secondary | ICD-10-CM | POA: Diagnosis not present

## 2016-11-21 DIAGNOSIS — J9621 Acute and chronic respiratory failure with hypoxia: Secondary | ICD-10-CM | POA: Diagnosis not present

## 2016-11-21 DIAGNOSIS — Z7982 Long term (current) use of aspirin: Secondary | ICD-10-CM | POA: Diagnosis not present

## 2016-11-21 DIAGNOSIS — J9601 Acute respiratory failure with hypoxia: Secondary | ICD-10-CM | POA: Diagnosis not present

## 2016-11-21 DIAGNOSIS — Z87891 Personal history of nicotine dependence: Secondary | ICD-10-CM | POA: Insufficient documentation

## 2016-11-21 DIAGNOSIS — G309 Alzheimer's disease, unspecified: Secondary | ICD-10-CM | POA: Diagnosis not present

## 2016-11-21 DIAGNOSIS — I469 Cardiac arrest, cause unspecified: Secondary | ICD-10-CM | POA: Insufficient documentation

## 2016-11-21 DIAGNOSIS — Z79899 Other long term (current) drug therapy: Secondary | ICD-10-CM | POA: Insufficient documentation

## 2016-11-21 DIAGNOSIS — R140 Abdominal distension (gaseous): Secondary | ICD-10-CM | POA: Insufficient documentation

## 2016-11-21 DIAGNOSIS — Z859 Personal history of malignant neoplasm, unspecified: Secondary | ICD-10-CM | POA: Insufficient documentation

## 2016-11-21 DIAGNOSIS — R402431 Glasgow coma scale score 3-8, in the field [EMT or ambulance]: Secondary | ICD-10-CM | POA: Diagnosis not present

## 2016-11-21 MED ORDER — SODIUM BICARBONATE 8.4 % IV SOLN
INTRAVENOUS | Status: AC | PRN
  Administered 2016-11-21 (×2): 100 meq via INTRAVENOUS

## 2016-11-21 MED ORDER — SODIUM CHLORIDE 0.9 % IV SOLN: INTRAVENOUS | Status: DC

## 2016-11-21 MED ORDER — AMIODARONE HCL 150 MG/3ML IV SOLN
INTRAVENOUS | Status: AC | PRN
  Administered 2016-11-21: 300 mg via INTRAVENOUS

## 2016-11-21 MED ORDER — TENECTEPLASE 50 MG IV KIT
45.0000 mg | PACK | INTRAVENOUS | Status: AC
  Administered 2016-11-21: 45 mg via INTRAVENOUS
  Filled 2016-11-21: qty 10

## 2016-11-21 MED ORDER — EPINEPHRINE PF 1 MG/10ML IJ SOSY
PREFILLED_SYRINGE | INTRAMUSCULAR | Status: AC | PRN
  Administered 2016-11-21 (×8): 1 via INTRAVENOUS

## 2016-11-22 MED FILL — Medication: Qty: 1 | Status: AC

## 2016-11-28 MED ORDER — WARFARIN SODIUM 5 MG PO TABS: 5.0000 mg | ORAL_TABLET | Freq: Every day | ORAL | Status: AC

## 2016-11-28 MED ORDER — ENOXAPARIN SODIUM 40 MG/0.4ML ~~LOC~~ SOLN: 80.0000 mg | SUBCUTANEOUS | Status: AC

## 2016-12-01 ENCOUNTER — Ambulatory Visit: Payer: Self-pay | Admitting: Family Medicine

## 2016-12-02 NOTE — Progress Notes (Signed)
PT was nasal intubated by EMS  Tube was removed upon arrival  7.5 et tube was installed  Placed on vent  No pulse  Code was worked and called

## 2016-12-02 NOTE — ED Notes (Signed)
Spoke with pt's family - [name]--

## 2016-12-02 NOTE — ED Notes (Signed)
At [phone]

## 2016-12-02 NOTE — ED Triage Notes (Addendum)
Pt to ED via [facility] from home -- pt haas hx of DVT in left arm, taking lovenox, pt was last seen normal at 2130 last night, at 0430 son states pt was not himself- slept until 0800-- pt had resp distress - son called ems-- on EMS arrival - pt was having agonal resp-- nasally intubated with 6.0 ETT-- EMS states has been in paced rhythm. Upon arrival into trauma A- pt was pulseless, no resp effort..  See code narrator

## 2016-12-02 NOTE — ED Provider Notes (Addendum)
[facility] DEPT Provider Note   CSN: [id number] Arrival date & time: 12-09-2016  6720     History   Chief Complaint Chief Complaint  Patient presents with  . Cardiac Arrest    HPI [name] is a 83 y.o. male.  HPI   83 yo M with extensive PMHx as below including recent pelvic fx, known DVT on anticoagulation who p/w respiratory failure.  According to EMS, they were called to the patient's house for acute shortness of breath. Patient was reportedly found with gasping, agonal respirations. He then stopped breathing and subsequently nasally intubated. En route, patient did begin reportedly moving. End tidal has been in the 30s. On arrival here in the emergency department, patient unresponsive and nasally intubated.  Level 5 caveat invoked as remainder of history, ROS, and physical exam limited due to patient's unresponsiveness..   Past Medical History:  Diagnosis Date  . A-fib ([place])   . Alzheimer disease   . Arthritis   . Asymptomatic carotid artery stenosis    BILATERAL MILD ICA---  <50% PER DUPLEX 03-08-2008  . BPH (benign prostatic hypertrophy)   . Cancer ([place])   . Cardiac pacemaker    CARDIOLOGIST-- DR Cristopher Peru (LAST PACE [facility] 05-15-2013)  . CHB (complete heart block) ([place])   . Chronic back pain   . Chronic pancreatitis ([place])    Based on CT findings  . Chronic systolic heart failure ([place])   . Costochondritis, acute   . Dementia   . Erectile dysfunction   . Glaucoma   . History of melanoma in situ    JAN 2012--  LEFT HEEL W/ SLN BX  . History of syncope   . HOH (hard of hearing)   . Hypertension   . IC (interstitial cystitis)   . Nonischemic cardiomyopathy ([place])   . Pancytopenia   . Poor historian   . Rash and other nonspecific skin eruption   . Severe mitral regurgitation   . Urge urinary incontinence   . Visual changes     Patient Active Problem List   Diagnosis Date Noted  . DVT of upper extremity (deep vein thrombosis) ([place]) 11/15/2016  .  Encounter for therapeutic drug monitoring 11/15/2016  . Closed fracture of acetabulum ([place]) 11/08/2016  . Pelvic fracture ([place]) 11/08/2016  . Generalized anxiety disorder 04/05/2016  . Pigmented skin lesion 04/05/2016  . Acute CHF (congestive heart failure) ([place]) 03/15/2016  . Tricuspid regurgitation 03/15/2016  . Pulmonary hypertension 10/01/2015  . Fatigue 10/01/2015  . Chronic back pain 10/01/2015  . Myelodysplasia 06/20/2015  . Dyspnea 02/09/2015  . Recurrent falls 08/01/2014  . Atrial fibrillation, persistent ([place]) 02/22/2014  . Bilateral leg weakness 05/01/2013  . Abnormal gait 06/12/2012  . GERD (gastroesophageal reflux disease) 05/09/2012  . Alzheimer's dementia 11/17/2011  . GEN OSTEOARTHROSIS INVOLVING MULTIPLE SITES 11/25/2009  . Central hearing loss 11/10/2009  . Biventricular cardiac pacemaker in situ 07/22/2009  . INTERSTITIAL CYSTITIS 07/14/2009  . Pancytopenia ([place]) 12/31/2008  . MITRAL REGURGITATION, MODERATE 12/31/2008  . Chronic systolic heart failure ([place]) 12/31/2008  . ERECTILE DYSFUNCTION 01/31/2008  . Glaucoma 01/31/2008  . Essential hypertension 01/31/2008    Past Surgical History:  Procedure Laterality Date  . BACK SURGERY  X3  LAST ONE'90's  . CATARACT EXTRACTION, BILATERAL  left  1997/   right 2003  . COLONOSCOPY  08/18/2006   NOB:SJGG granularity and erosions of the rectum of uncertain significance biopsied.  A long redundant, but otherwise normal-appearing colon.melanosi coli and minimal inflammation on bx  .  COLONOSCOPY N/A 08/05/2014   Procedure: COLONOSCOPY;  Surgeon: [name], MD;  Location[facility] END[facility];  Service: Endoscopy;  Laterality: N/A;  PT NEEDS TCS AT 1000.  Marland Kitchen CYSTO/ HOD/  REPLACEMENT INTERSTIM IMPLANT  05-14-2003  . CYSTOSCOPY W/ RETROGRADES N/A 02/11/2016   Procedure: CYSTOSCOPY WITH RETROGRADE PYELOGRAM;  Surgeon: Cleon Gustin, MD;  Location: [facility] ORS;  Service: Urology;  Laterality: N/A;  . ESOPHAGOGASTRODUODENOSCOPY N/A  03/28/2014   Dr. [name]: normal esophagus s/p dilation, mosaic appearance - chronic inflammation noted on bx  . IMPLANTABLE CARDIOVERTER DEFIBRILLATOR (ICD) GENERATOR CHANGE N/A 10/11/2014   Procedure: ICD GENERATOR CHANGE;  Surgeon: [name], MD;  Location: [facility] CATH LAB;  Service: Cardiovascular;  Laterality: N/A;  . INGUINAL HERNIA REPAIR Bilateral AS TEEN  . INTERSTIM IMPLANT PLACEMENT  2001  &  2007  . INTERSTIM IMPLANT REVISION N/A 08/28/2013   Procedure: REPLACEMENT OF INTERSTIM-GENERATOR AND LEAD ;  Surgeon: [name], MD;  Location: [facility];  Service: Urology;  Laterality: N/A;  . LEFT ELBOW SURGERY  2002  . MALONEY DILATION N/A 03/28/2014   Procedure: Venia Minks DILATION;  Surgeon: [name], MD;  [facility];  Service: Endoscopy;  Laterality: N/A;  . PACEMAKER INSERTION  12-06-2008  DR [name]   BiV PPM  --  [employer]  . REMOVAL INTERSTIM IMPLANT/  CYST/  HOD  04-14-2004  . REVISION INTERSTIM IMPLANT AND REPLACE GENERATOR  05-15-2009   left upper buttock for urge urinary incontinence  . SAVORY DILATION N/A 03/28/2014   Procedure: SAVORY DILATION;  Surgeon: [name], MD;  [facility];  Service: Endoscopy;  Laterality: N/A;  . TONSILLECTOMY  1950  . TRANSTHORACIC ECHOCARDIOGRAM  12-06-2008   LVSF 55-60%/  MODERATE MR/  MILD AR/  QUESTION DISTAL POSTERIOR HYPOKINESIS  . TRANSURETHRAL RESECTION OF BLADDER TUMOR WITH GYRUS (TURBT-GYRUS) N/A 02/11/2016   Procedure: TRANSURETHRAL RESECTION OF BLADDER TUMOR WITH GYRUS (TURBT-GYRUS);  Surgeon: Cleon Gustin, MD;  Location: [facility] OR[facility];  Service: Urology;  Laterality: N/A;  . WIDE EXCISION LEFT HEEL AND SLN BX  JUNE 2012       Home Medications    Prior to Admission medications   Medication Sig Start Date End Date Taking? Authorizing Provider  acetaminophen (TYLENOL) 650 MG CR tablet Take 650 mg by mouth every 8 (eight) hours as needed for pain.     Historical Provider, MD   amLODipine (NORVASC) 2.5 MG tablet TAKE 1 TABLET (2.5 MG TOTAL) BY MOUTH DAILY. 11/05/16   Fayrene Helper, MD  aspirin EC 81 MG EC tablet Take 1 tablet (81 mg total) by mouth daily. 03/16/16   Samuella Cota, MD  busPIRone (BUSPAR) 5 MG tablet TAKE 1 TABLET BY MOUTH EVERY DAY AS NEEDED FOR ANXIETY 07/27/16   Fayrene Helper, MD  Carboxymethylcellul-Glycerin 0.5-0.9 % SOLN Apply 1 drop to eye daily. One drop both eyes     Historical Provider, MD  diclofenac sodium (VOLTAREN) 1 % GEL Apply 2 g topically daily as needed (pain). Reported on 01/05/2016 12/04/15   Historical Provider, MD  dorzolamide (TRUSOPT) 2 % ophthalmic solution Place 1 drop into both eyes daily. One drop both eyes     Historical Provider, MD  ENSURE (ENSURE) Take 237 mLs by mouth daily. Pt takes daily     Historical Provider, MD  furosemide (LASIX) 20 MG tablet Take 20 mg by mouth daily.    Historical Provider, MD  ibuprofen (ADVIL,MOTRIN)  200 MG tablet Take 400 mg by mouth every 6 (six) hours as needed for moderate pain.    Historical Provider, MD  latanoprost (XALATAN) 0.005 % ophthalmic solution Place 1 drop into both eyes at bedtime. Both eyes     Historical Provider, MD  Meth-Hyo-M Bl-Na Phos-Ph Sal (URIBEL) 118 MG CAPS Take 1 capsule (118 mg total) by mouth 2 (two) times daily as needed (bladder). 02/11/16   Cleon Gustin, MD  polyethylene glycol powder (GLYCOLAX/MIRALAX) powder Take 17 g by mouth daily. Patient taking differently: Take 17 g by mouth daily as needed for mild constipation.  05/16/15   Fayrene Helper, MD    Family History Family History  Problem Relation Age of Onset  . Dementia Mother   . Throat cancer Father   . Cancer Father 86    throat  . Lung cancer Sister   . Heart disease Brother     Social History Social History  Substance Use Topics  . Smoking status: Former Smoker    Packs/day: 0.02    Years: 30.00    Types: Cigarettes    Quit date: 08/24/1997  . Smokeless tobacco: Never  Used  . Alcohol use No     Allergies   Codeine and Penicillins   Review of Systems Review of Systems  Unable to perform ROS: Patient unresponsive     Physical Exam Updated Vital Signs Wt 177 lb (80.3 kg)   BMI 25.40 kg/m   Physical Exam  Constitutional:  Unresponsive, on backboard, with nasal endotracheal tube in place.  HENT:  Head: Normocephalic and atraumatic.  No apparent trauma to the head. Nasal airway in right nares.  Eyes: Conjunctivae are normal. Pupils are equal, round, and reactive to light.  Neck: Neck supple.  There are course breath sounds audible in the upper neck.  Cardiovascular:  No identifiable heart sounds  Pulmonary/Chest: Effort normal and breath sounds normal.  Right greater than left breath sounds with bagging.  Abdominal: Soft. He exhibits distension.  Musculoskeletal: He exhibits edema.  Neurological:  Pupils constricted and nonreactive bilaterally. Negative corneal or gag reflex. No spontaneous respirations. No spontaneous movement.  Skin: Capillary refill takes more than 3 seconds.  cool  Nursing note reviewed.    ED Treatments / Results  Labs (all labs ordered are listed, but only abnormal results are displayed) Labs Reviewed  CBC  DIFFERENTIAL  PROTIME-INR  APTT  COMPREHENSIVE METABOLIC PANEL  APTT  PROTIME-INR  CBC  I-STAT TROPOININ, ED  I-STAT CG4 LACTIC ACID, ED  I-STAT TROPOININ, ED  I-STAT CG4 LACTIC ACID, ED  I-STAT TROPOININ, ED    EKG  EKG Interpretation None       Radiology No results found.  Procedures .Critical Care Performed by: Duffy Bruce Authorized by: Duffy Bruce   Critical care provider statement:    Critical care time (minutes):  75   Critical care time was exclusive of:  Separately billable procedures and treating other patients   Critical care was necessary to treat or prevent imminent or life-threatening deterioration of the following conditions:  Cardiac failure, respiratory  failure and circulatory failure   Critical care was time spent personally by me on the following activities:  Development of treatment plan with patient or surrogate, blood draw for specimens, discussions with consultants, evaluation of patient's response to treatment, examination of patient, obtaining history from patient or surrogate, ordering and performing treatments and interventions, ordering and review of laboratory studies, ordering and review of radiographic studies, pulse oximetry,  re-evaluation of patient's condition, review of old charts, ventilator management and vascular access procedures   I assumed direction of critical care for this patient from another provider in my specialty: no   Procedure Name: Intubation Date/Time: 12/11/2016 4:17 PM Performed by: Duffy Bruce Pre-anesthesia Checklist: Emergency Drugs available, Suction available, Patient being monitored and Timeout performed Oxygen Delivery Method: Non-rebreather mask Preoxygenation: Pre-oxygenation with 100% oxygen Intubation Type: Rapid sequence Ventilation: Mask ventilation without difficulty Laryngoscope Size: Mac and 4 Grade View: Grade I Number of attempts: 1 Placement Confirmation: ETT inserted through vocal cords under direct vision,  Positive ETCO2,  CO2 detector and Breath sounds checked- equal and bilateral Secured at: 22 cm Tube secured with: Tape Dental Injury: Teeth and Oropharynx as per pre-operative assessment  Future Recommendations: Recommend- induction with short-acting agent, and alternative techniques readily available      (including critical care time)     EMERGENCY DEPARTMENT [place] CARDIAC EXAM "Study: Limited Ultrasound of the Heart and Pericardium"  INDICATIONS:Cardiac arrest Multiple views of the heart and pericardium were obtained in real-time with a multi-frequency probe.  PERFORMED ZD:GUYQIH IMAGES ARCHIVED?: Yes LIMITATIONS:  Emergent procedure VIEWS USED: Subcostal 4 chamber  and Inferior Vena Cava INTERPRETATION: Cardiac activity absent    Intraosseous IV placement Left proximal tibia was identified and cleansed with chlorhexidine. Using an Easy IO drill, the medial, proximal tibia was entered and an IO line placed. There was good blood return and easy infusion of fluids. The IO was dressed in sterile dressing and secured.  Medications Ordered in ED Medications  0.9 %  sodium chloride infusion (not administered)  EPINEPHrine (ADRENALIN) 1 MG/10ML injection (1 Syringe Intravenous Given 12-11-2016 0926)  tenecteplase (TNKASE) injection 45 mg (45 mg Intravenous Given 2016/12/11 0916)  amiodarone (CORDARONE) 300 mg in dextrose 5 % 300 mL bolus ( Intravenous Stopped 12-11-16 1524)  sodium bicarbonate injection (100 mEq Intravenous Given Dec 11, 2016 0909)     Initial Impression / Assessment and Plan / ED Course  I have reviewed the triage vital signs and the nursing notes.  Pertinent labs & imaging results that were available during my care of the patient were reviewed by me and considered in my medical decision making (see chart for details).     83 year old male with history of dementia and complex recent history including pelvic fracture status post recent hospitalization complicated by known left upper extremity DVT who presents with primary PEA arrest. On arrival, patient is pulseless and unresponsive. He has an nasal endotracheal tube that appears to be oxygenating but is likely in the upper trachea or oropharynx based on auscultation. This was immediately removed and an endotracheal tube was placed uneventfully. Patient persists in pulseless electrical activity. Bicarbonate, calcium, and epinephrine given. See code documentation.  Given the patient's recent DVT, with primary pulseless electrical activity arrest, primary concern at this time is acute, severe pulmonary embolism. Given patient's pulselessness, decision made to pursue push dose TPA. This was given. Code was  continued for 20 minutes with no return of spontaneous circulation. He was noted to be intermittently in coarse ventricular fibrillation which was shocked with no improvement. Patient ultimately entered asystole. Given prolonged downtime, severe underlying medical comorbidities, and lack of any return of circulation despite over one hour of cutting, medical futility was determined and I do not suspect the patient has a survivable illness. The code was subsequent called and the patient pronounced dead. I discussed with the patient's son, who is a respiratory therapist, he was appreciative and understanding.  I notified the medical examiner who will accept the case.   Final Clinical Impressions(s) / ED Diagnoses   Final diagnoses:  Cardiac arrest [facility])  PEA (Pulseless electrical activity) ([place])  Acute respiratory failure with hypoxia [facility])    New Prescriptions Discharge Medication List as of 11/22/2016  3:25 PM       Duffy Bruce, MD 11-22-16 1619    Duffy Bruce, MD 11/22/16 1242

## 2016-12-02 DEATH — deceased

## 2017-06-29 IMAGING — CT CT KNEE*L* W/CM
3 of 5 series · 13 of 33 positions shown, 15 images · IV contrast (agent unspecified)
Comparison: None.

CLINICAL DATA: Left posterior knee pain.

EXAM:
CT OF THE  KNEE WITH CONTRAST
TECHNIQUE: Multidetector CT imaging was performed following the standard
protocol during bolus administration of intravenous contrast.

[Series 4: knee coronal bone 2.0 · coronal · 0.26mm/px · 1 of 78 slices shown]
[im 39/78  bone]
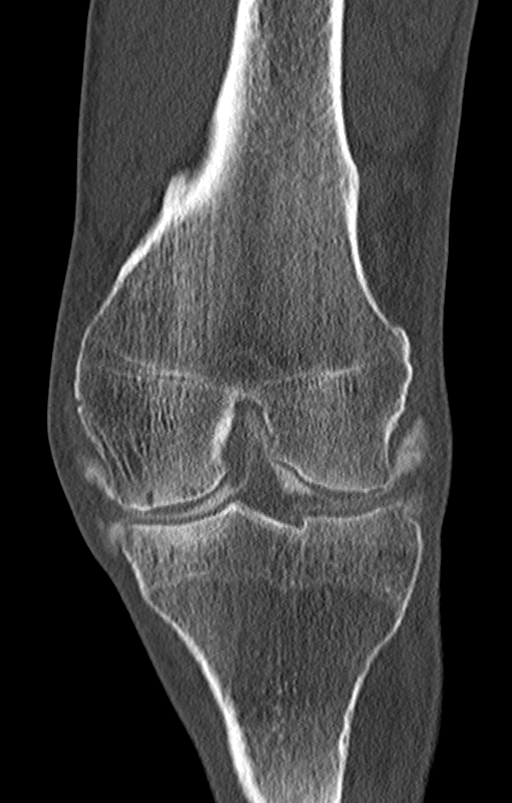

[Series 5: knee pacs 2.0 st · axial · 0.31mm/px · z∈[-876,-696]mm · 7 of 108 slices shown, 9 images]
[im 9/108  soft-tissue]
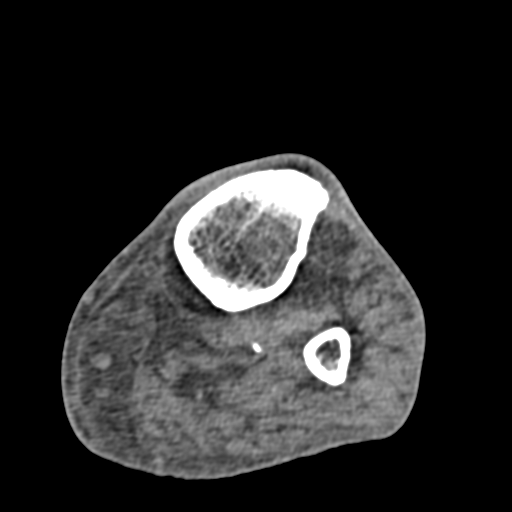
[im 9/108  bone]
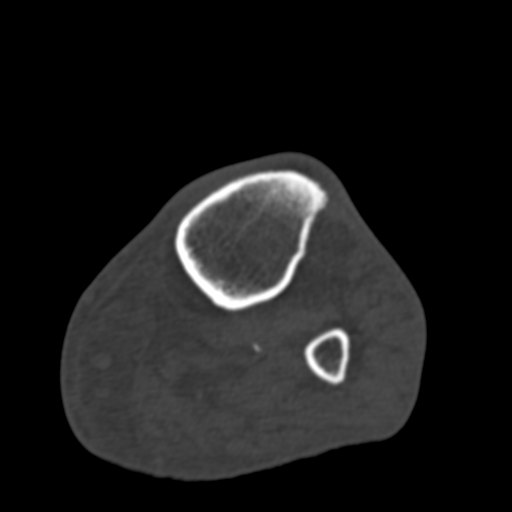
[im 25/108  bone]
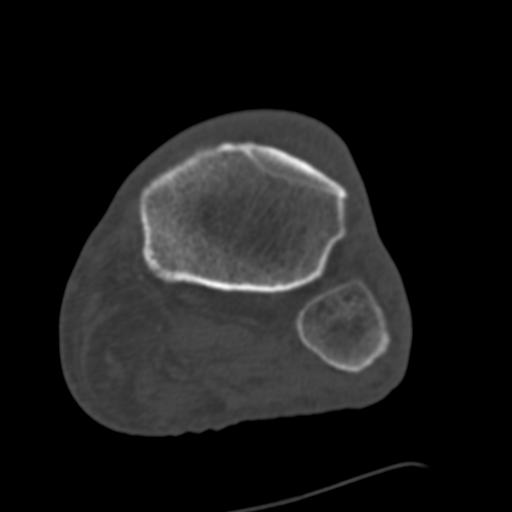
[im 42/108  bone]
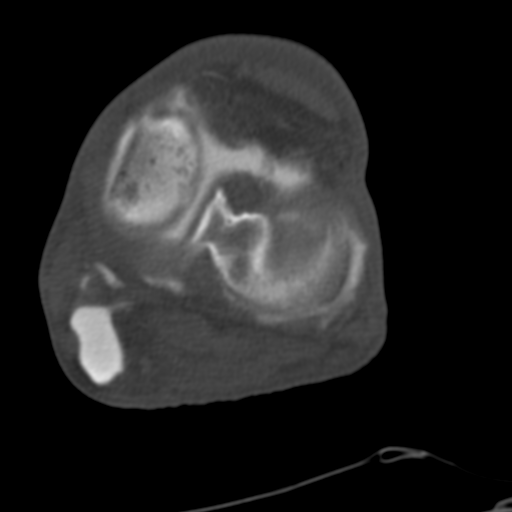
[im 58/108  bone]
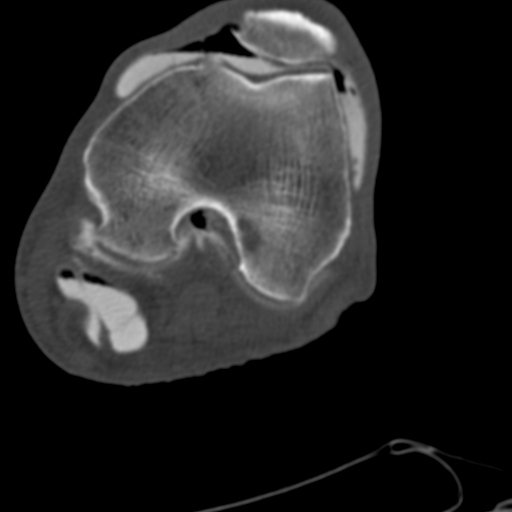
[im 66/108  soft-tissue]
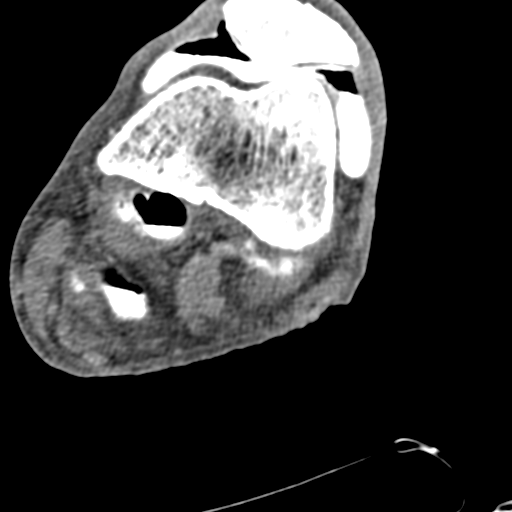
[im 66/108  bone]
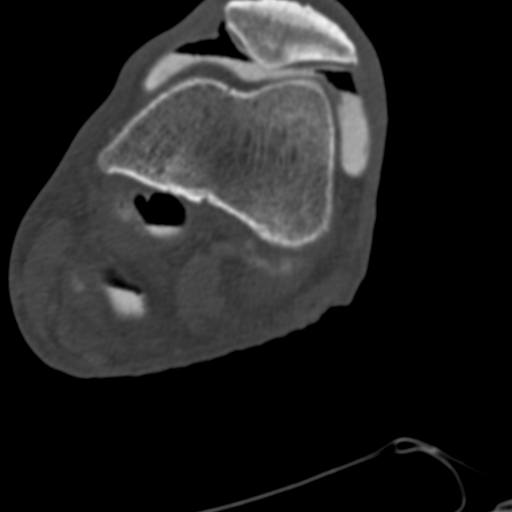
[im 83/108  bone]
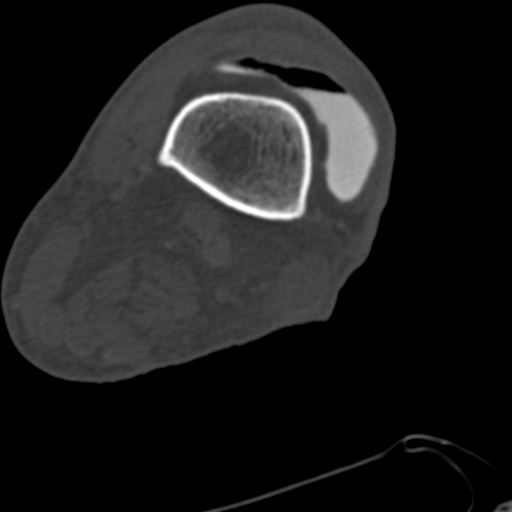
[im 99/108  bone]
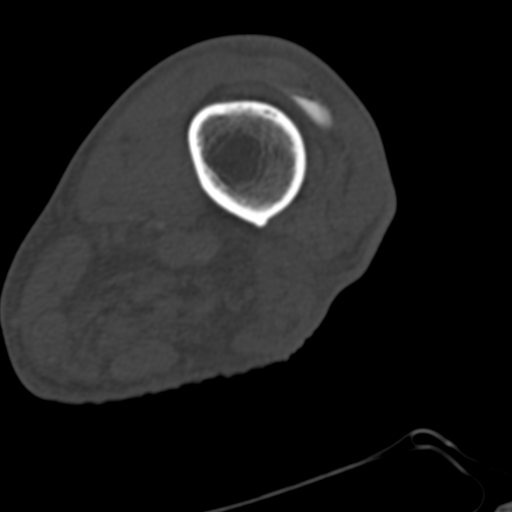

[Series 7: knee pacs 2.0 st sag · sagittal · 0.30mm/px · 5 of 72 slices shown]
[im 12/72  bone]
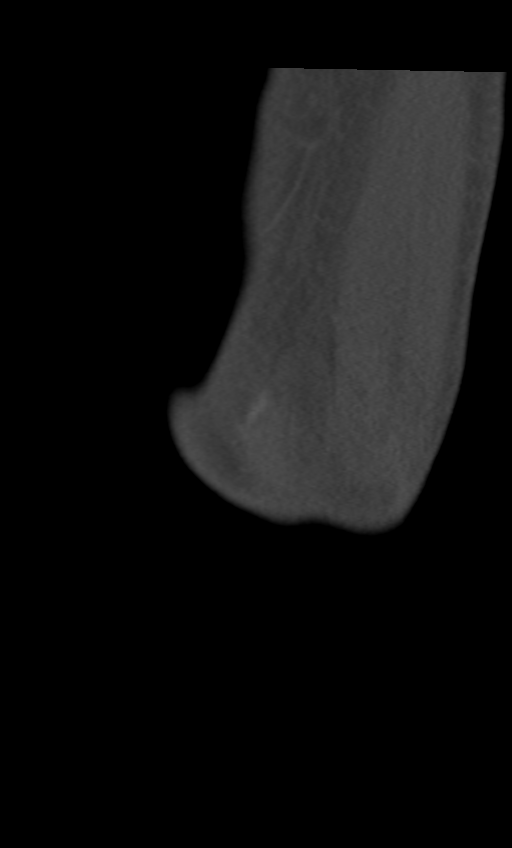
[im 24/72  bone]
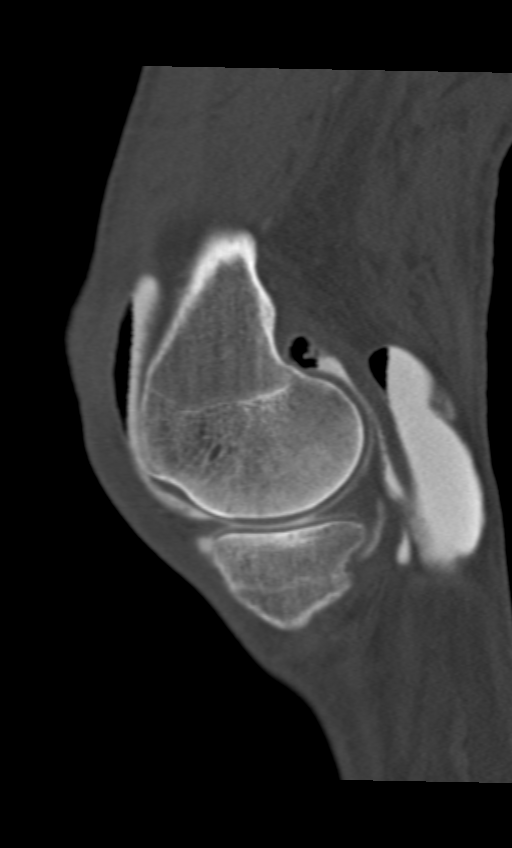
[im 36/72  bone]
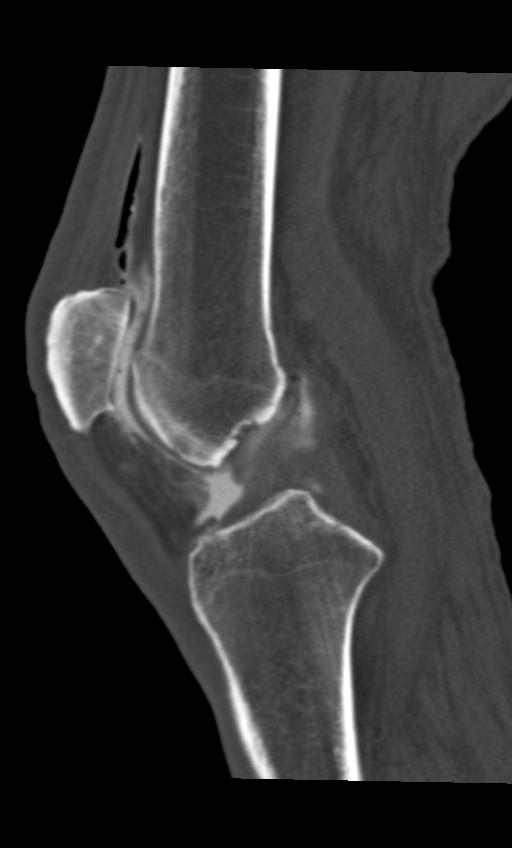
[im 48/72  bone]
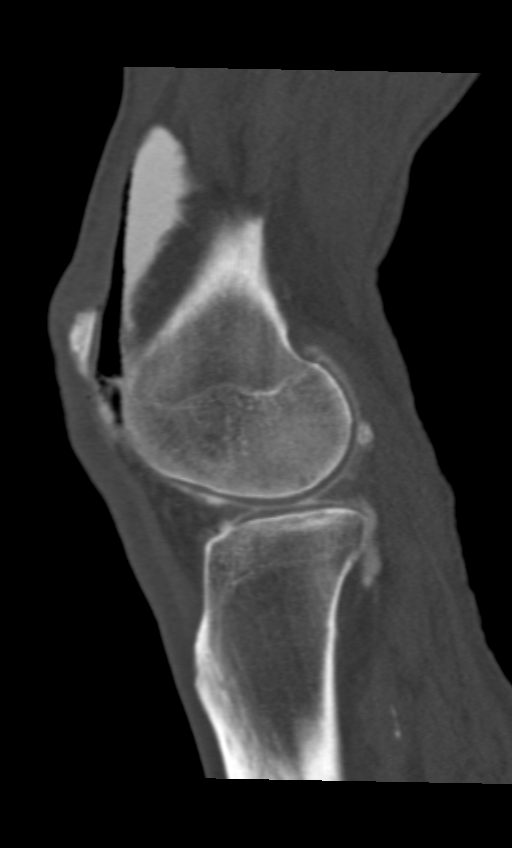
[im 60/72  bone]
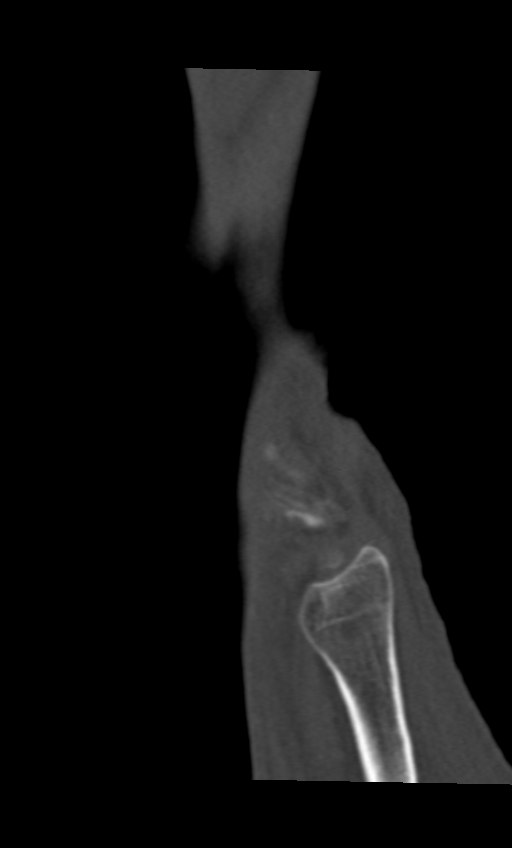

[13 of 33 positions shown; findings below may reference images not displayed]

FINDINGS: MENISCI

Medial: Grossly intact.

Lateral: Blunting of the free edge of the body of the lateral
meniscus likely reflecting a small radial tear. Small oblique tear
of the body of the lateral meniscus extending to the superior
surface.

LIGAMENTS

Cruciates: ACL Intact. PCL Intact.

Collaterals: MCL grossly intact. Lateral collateral ligament complex
grossly intact.

CARTILAGE

Patellofemoral: High-grade partial-thickness cartilage loss with
areas of full-thickness cartilage loss involving the medial patellar
facet. Partial thickness cartilage loss of the medial trochlea.
Partial-thickness cartilage loss of the lateral patellofemoral
compartment.

Medial compartment: Partial thickness cartilage loss of the medial
femorotibial compartment.

Lateral compartment: No focal chondral defect.

BONES: No fracture or dislocation. No lytic or sclerotic osseous
lesion. Normal alignment.

JOINT: Intra-articular contrast is present. Normal [place].
No plical thickening.

EXTENSOR MECHANISM: Grossly intact.

POPLITEAL FOSSA: Popliteus tendon is intact. Large Baker cyst.

Soft tissue: There is atrophy of the medial gastrocnemius muscle.
The remainder the muscles are normal. There is no hematoma. There is
peripheral vascular atherosclerotic disease.
IMPRESSION: 1. Cartilage abnormalities involving the medial femorotibial
compartment and patellofemoral compartment as described above.
2. Small oblique tear of the body of the lateral meniscus extending
to the superior surface.
3. Large Baker cyst.

## 2017-12-30 NOTE — Telephone Encounter (Signed)
error 

## 2018-02-19 IMAGING — DX DG CHEST 2V
2 series · 2 of 2 positions shown · non-contrast
Comparison: 01/06/2015

CLINICAL DATA: Worsening shortness of breath for 3-4 weeks.
Intermittent chest pain.

EXAM:
CHEST  2 VIEW

[chest pa]
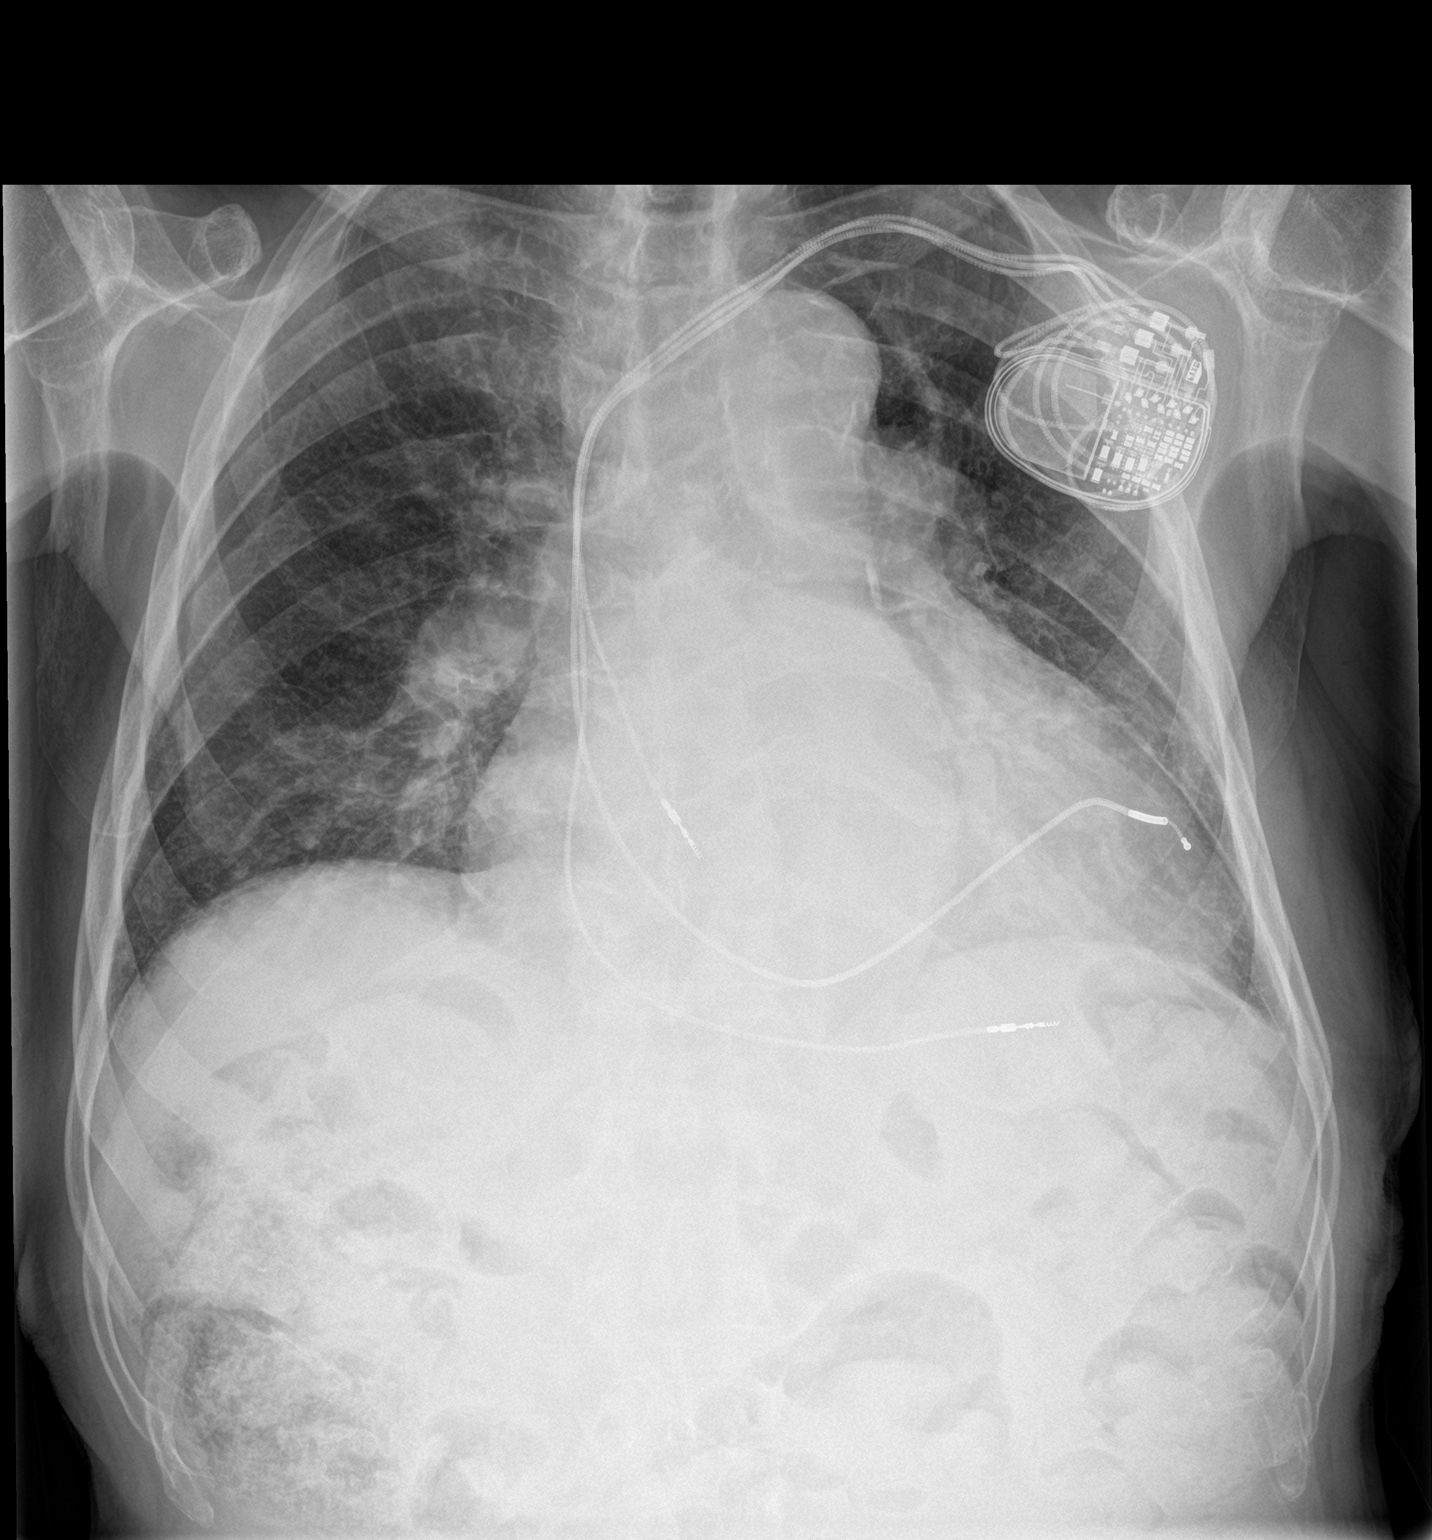

[chest lat]
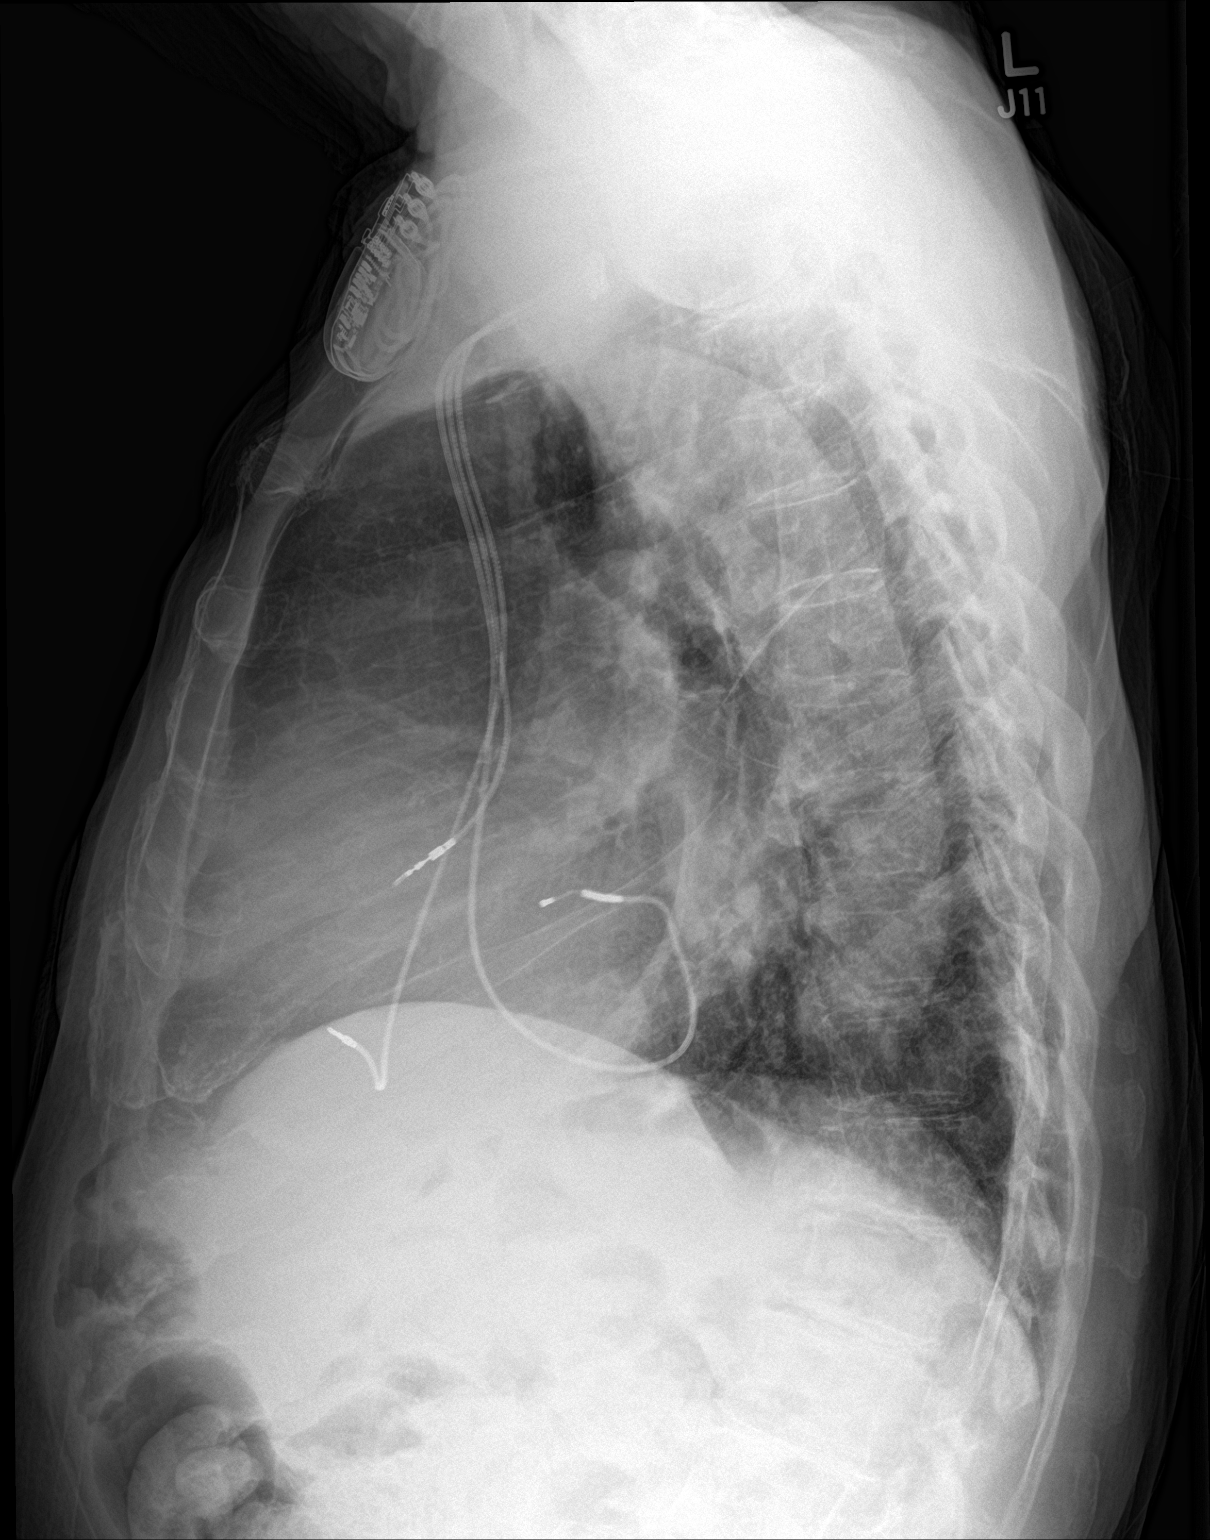

[2 of 2 positions shown; findings below may reference images not displayed]

FINDINGS: Cardiomegaly with vascular congestion. Bilateral lower lobe airspace
opacities are noted, new since prior study. No effusions. No acute
bony abnormality.
IMPRESSION: Cardiomegaly with vascular congestion. Bilateral infrahilar airspace
opacities could reflect early edema or infection.

## 2018-04-26 IMAGING — DX DG CHEST 2V
2 series · 2 of 2 positions shown · non-contrast
Comparison: February 14, 2016

CLINICAL DATA: Hallucinations

EXAM:
CHEST  2 VIEW

[chest lat]
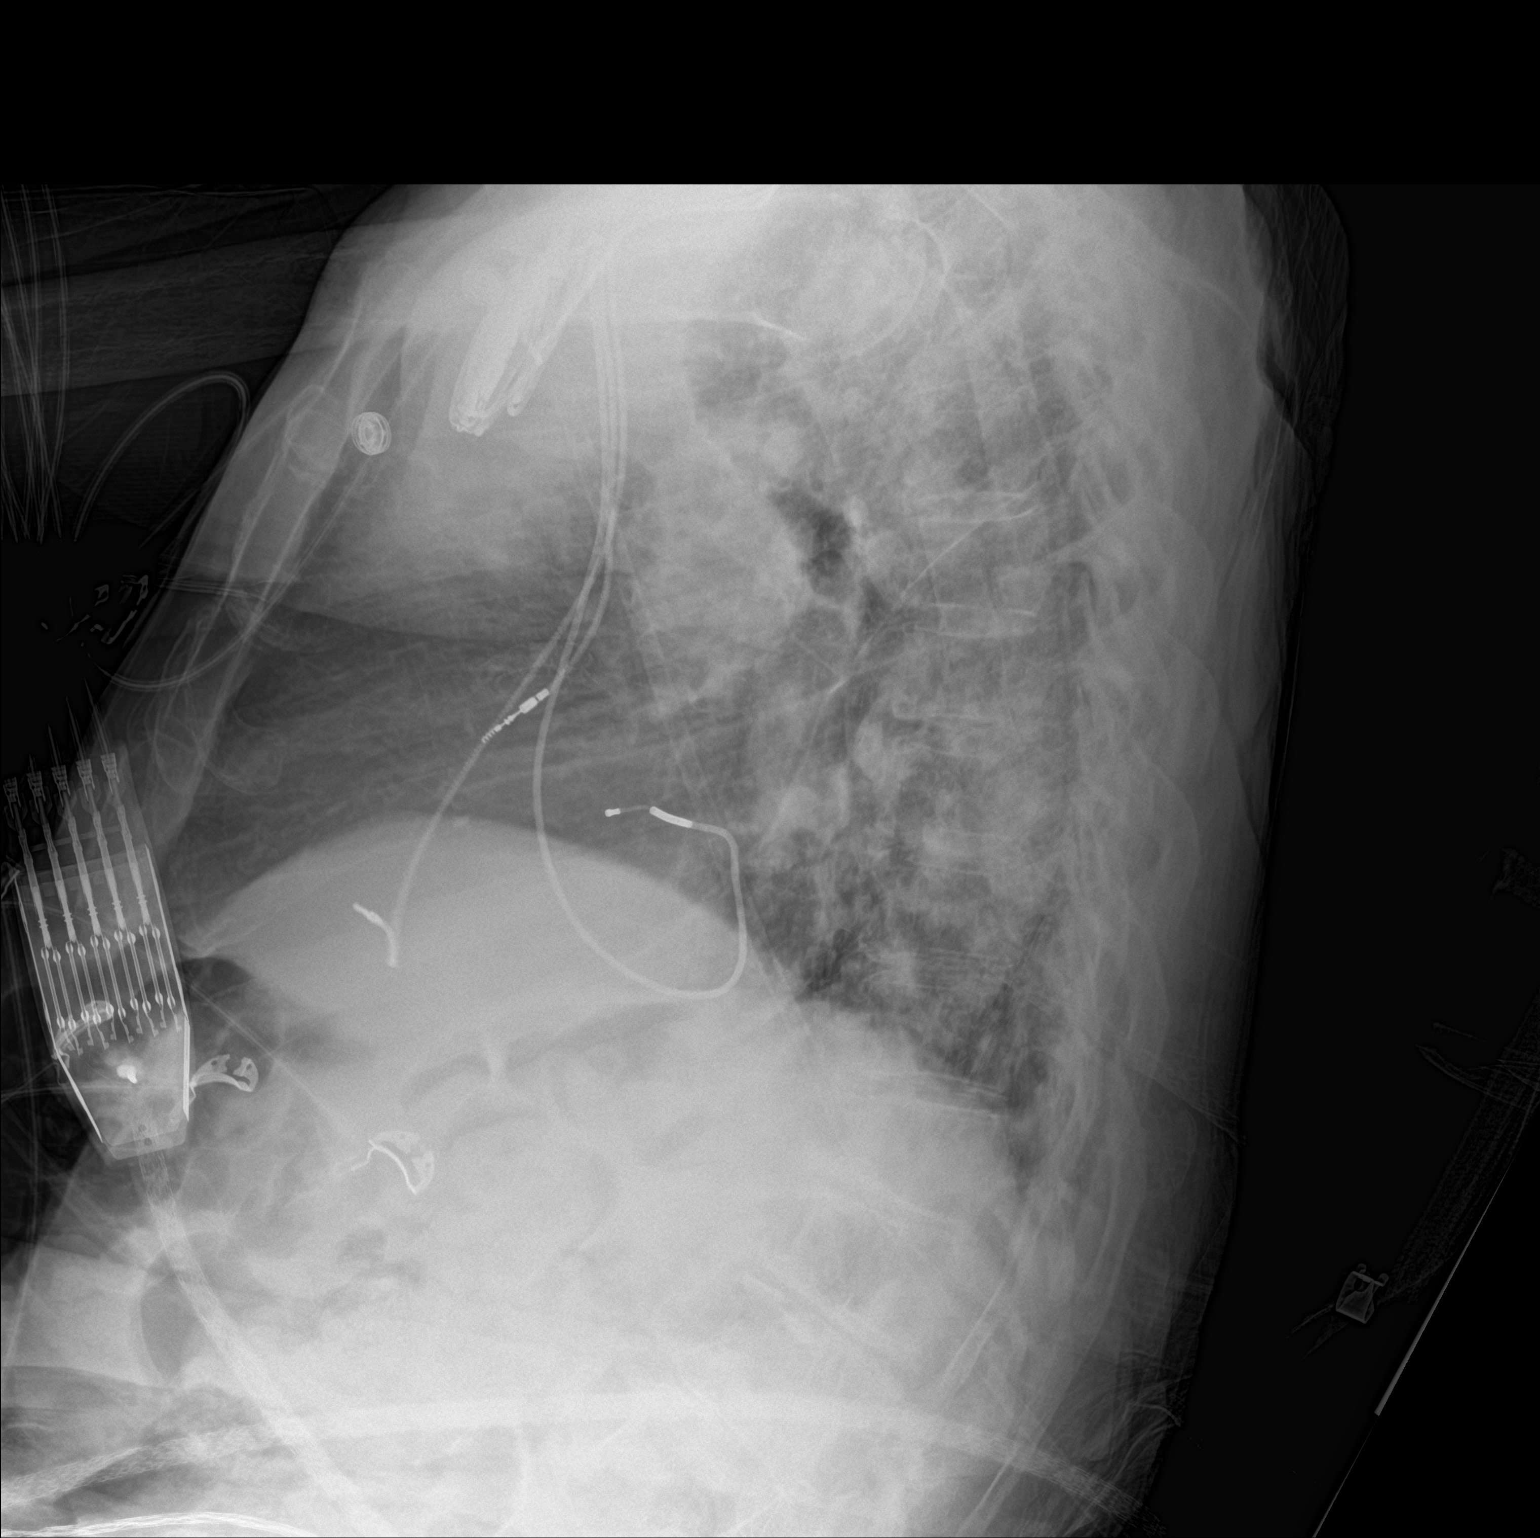

[chest ap]
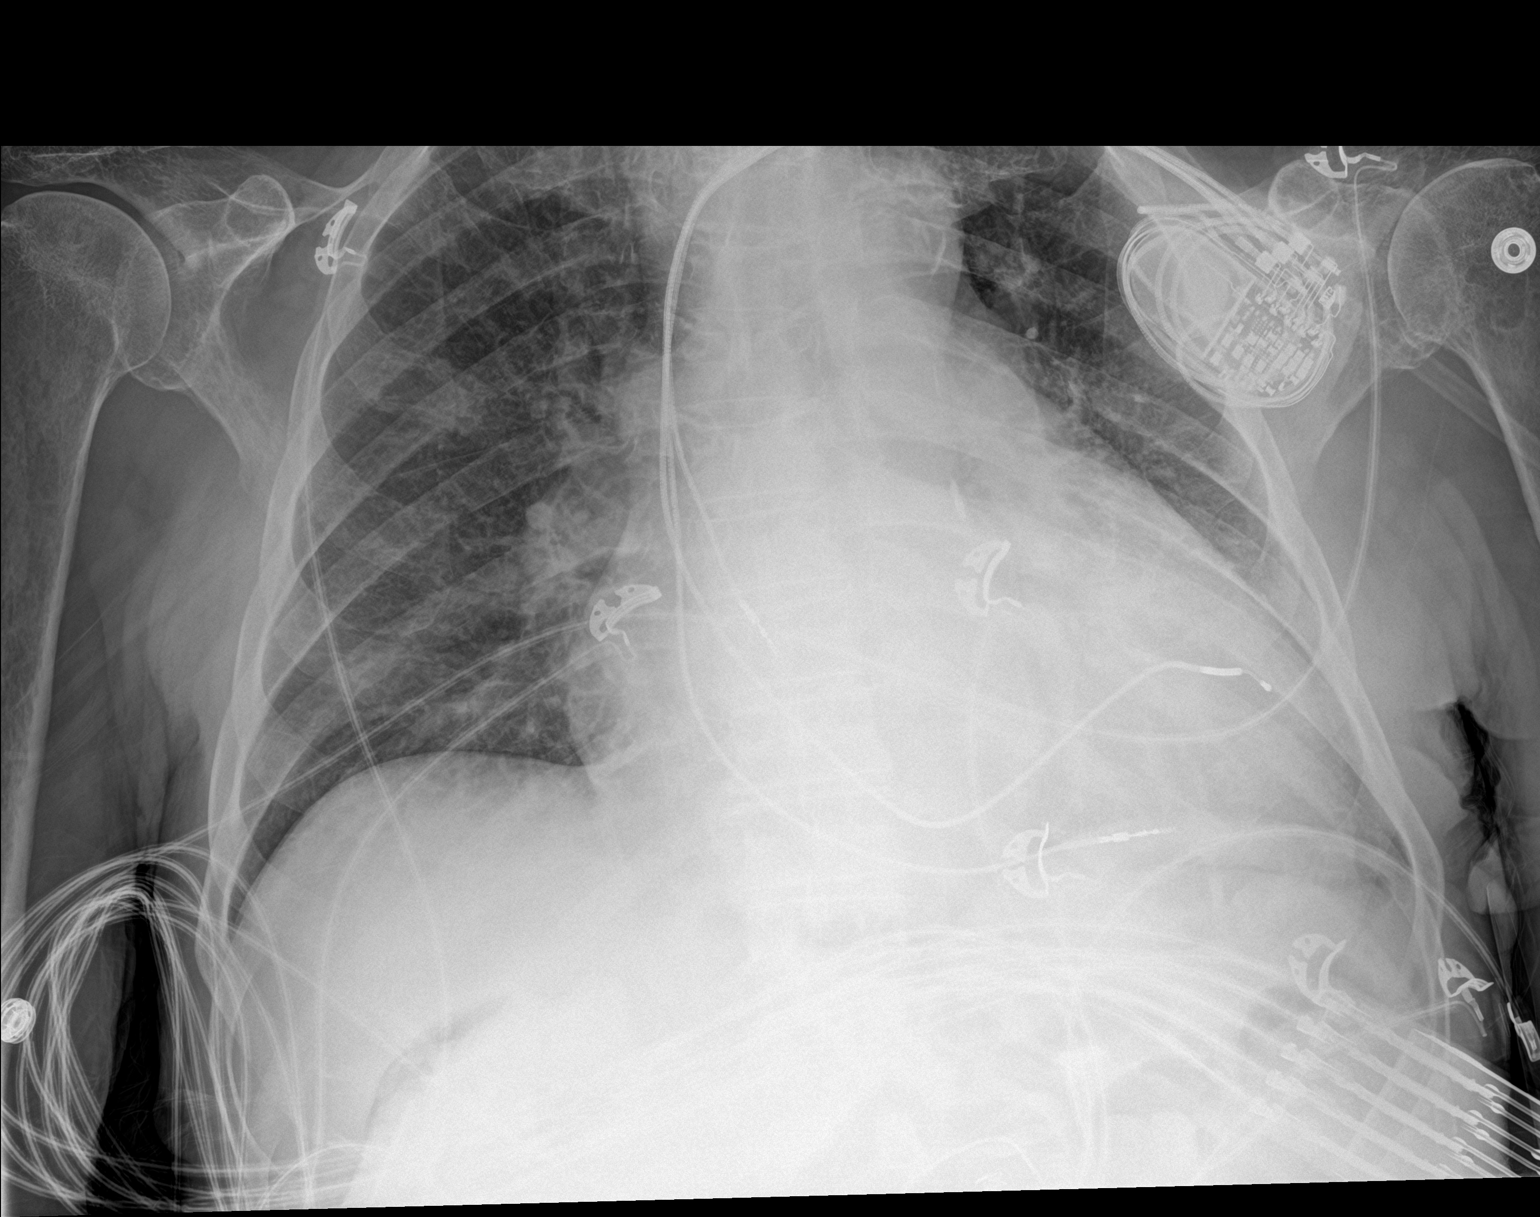

[2 of 2 positions shown; findings below may reference images not displayed]

FINDINGS: There is no edema or consolidation. Cardiomegaly with pulmonary
venous hypertension remains. Defibrillator leads are attached to the
right atrium, right ventricle, and left ventricle, stable. No
adenopathy. There is degenerative change in the lower thoracic
region.
IMPRESSION: Evidence of pulmonary vascular congestion, stable. No edema or
consolidation. Stable cardiac silhouette.
